# Patient Record
Sex: Male | Born: 1945
Health system: Southern US, Community
[De-identification: ages and names within clinical notes are randomized; demographics above are authoritative.]

## PROBLEM LIST (undated history)

## (undated) DIAGNOSIS — G8929 Other chronic pain: Secondary | ICD-10-CM

## (undated) DIAGNOSIS — E1151 Type 2 diabetes mellitus with diabetic peripheral angiopathy without gangrene: Secondary | ICD-10-CM

## (undated) DIAGNOSIS — R0602 Shortness of breath: Secondary | ICD-10-CM

## (undated) DIAGNOSIS — H9190 Unspecified hearing loss, unspecified ear: Secondary | ICD-10-CM

## (undated) DIAGNOSIS — M542 Cervicalgia: Secondary | ICD-10-CM

## (undated) DIAGNOSIS — H9319 Tinnitus, unspecified ear: Secondary | ICD-10-CM

## (undated) DIAGNOSIS — Z8669 Personal history of other diseases of the nervous system and sense organs: Secondary | ICD-10-CM

## (undated) DIAGNOSIS — J45909 Unspecified asthma, uncomplicated: Secondary | ICD-10-CM

## (undated) DIAGNOSIS — F439 Reaction to severe stress, unspecified: Secondary | ICD-10-CM

## (undated) DIAGNOSIS — I739 Peripheral vascular disease, unspecified: Secondary | ICD-10-CM

## (undated) DIAGNOSIS — Z8673 Personal history of transient ischemic attack (TIA), and cerebral infarction without residual deficits: Secondary | ICD-10-CM

## (undated) DIAGNOSIS — D649 Anemia, unspecified: Secondary | ICD-10-CM

## (undated) DIAGNOSIS — J449 Chronic obstructive pulmonary disease, unspecified: Secondary | ICD-10-CM

## (undated) DIAGNOSIS — L02612 Cutaneous abscess of left foot: Secondary | ICD-10-CM

## (undated) DIAGNOSIS — I499 Cardiac arrhythmia, unspecified: Secondary | ICD-10-CM

## (undated) DIAGNOSIS — M7989 Other specified soft tissue disorders: Secondary | ICD-10-CM

## (undated) DIAGNOSIS — R0609 Other forms of dyspnea: Secondary | ICD-10-CM

## (undated) DIAGNOSIS — M109 Gout, unspecified: Secondary | ICD-10-CM

## (undated) DIAGNOSIS — M545 Low back pain, unspecified: Secondary | ICD-10-CM

## (undated) DIAGNOSIS — I671 Cerebral aneurysm, nonruptured: Secondary | ICD-10-CM

## (undated) DIAGNOSIS — M7130 Other bursal cyst, unspecified site: Secondary | ICD-10-CM

## (undated) DIAGNOSIS — I714 Abdominal aortic aneurysm, without rupture: Secondary | ICD-10-CM

## (undated) DIAGNOSIS — M6289 Other specified disorders of muscle: Secondary | ICD-10-CM

## (undated) DIAGNOSIS — R531 Weakness: Secondary | ICD-10-CM

## (undated) DIAGNOSIS — L299 Pruritus, unspecified: Secondary | ICD-10-CM

## (undated) DIAGNOSIS — F32A Depression, unspecified: Secondary | ICD-10-CM

## (undated) DIAGNOSIS — M436 Torticollis: Secondary | ICD-10-CM

## (undated) DIAGNOSIS — G609 Hereditary and idiopathic neuropathy, unspecified: Secondary | ICD-10-CM

## (undated) DIAGNOSIS — M25551 Pain in right hip: Secondary | ICD-10-CM

## (undated) DIAGNOSIS — I639 Cerebral infarction, unspecified: Secondary | ICD-10-CM

## (undated) DIAGNOSIS — M1990 Unspecified osteoarthritis, unspecified site: Secondary | ICD-10-CM

## (undated) DIAGNOSIS — R252 Cramp and spasm: Secondary | ICD-10-CM

## (undated) DIAGNOSIS — N200 Calculus of kidney: Secondary | ICD-10-CM

## (undated) DIAGNOSIS — I10 Essential (primary) hypertension: Secondary | ICD-10-CM

## (undated) DIAGNOSIS — J189 Pneumonia, unspecified organism: Secondary | ICD-10-CM

## (undated) DIAGNOSIS — F329 Major depressive disorder, single episode, unspecified: Secondary | ICD-10-CM

## (undated) DIAGNOSIS — E119 Type 2 diabetes mellitus without complications: Secondary | ICD-10-CM

## (undated) DIAGNOSIS — M79605 Pain in left leg: Secondary | ICD-10-CM

## (undated) DIAGNOSIS — T7840XA Allergy, unspecified, initial encounter: Secondary | ICD-10-CM

## (undated) DIAGNOSIS — M25569 Pain in unspecified knee: Secondary | ICD-10-CM

## (undated) DIAGNOSIS — N289 Disorder of kidney and ureter, unspecified: Secondary | ICD-10-CM

## (undated) DIAGNOSIS — J42 Unspecified chronic bronchitis: Secondary | ICD-10-CM

## (undated) DIAGNOSIS — K219 Gastro-esophageal reflux disease without esophagitis: Secondary | ICD-10-CM

## (undated) DIAGNOSIS — J309 Allergic rhinitis, unspecified: Secondary | ICD-10-CM

## (undated) DIAGNOSIS — R5383 Other fatigue: Secondary | ICD-10-CM

## (undated) DIAGNOSIS — G629 Polyneuropathy, unspecified: Secondary | ICD-10-CM

## (undated) DIAGNOSIS — H43399 Other vitreous opacities, unspecified eye: Secondary | ICD-10-CM

## (undated) DIAGNOSIS — I2510 Atherosclerotic heart disease of native coronary artery without angina pectoris: Secondary | ICD-10-CM

## (undated) DIAGNOSIS — F419 Anxiety disorder, unspecified: Secondary | ICD-10-CM

## (undated) DIAGNOSIS — E785 Hyperlipidemia, unspecified: Secondary | ICD-10-CM

## (undated) DIAGNOSIS — Z87442 Personal history of urinary calculi: Secondary | ICD-10-CM

## (undated) HISTORY — DX: Tinnitus, unspecified ear: H93.19

## (undated) HISTORY — DX: Other bursal cyst, unspecified site: M71.30

## (undated) HISTORY — DX: Disorder of kidney and ureter, unspecified: N28.9

## (undated) HISTORY — DX: Torticollis: M43.6

## (undated) HISTORY — DX: Pruritus, unspecified: L29.9

## (undated) HISTORY — DX: Other vitreous opacities, unspecified eye: H43.399

## (undated) HISTORY — DX: Chronic obstructive pulmonary disease, unspecified: J44.9

## (undated) HISTORY — DX: Weakness: R53.1

## (undated) HISTORY — DX: Allergy, unspecified, initial encounter: T78.40XA

## (undated) HISTORY — DX: Pain in left leg: M79.605

## (undated) HISTORY — DX: Cerebral infarction, unspecified: I63.9

## (undated) HISTORY — DX: Hyperlipidemia, unspecified: E78.5

## (undated) HISTORY — DX: Major depressive disorder, single episode, unspecified: F32.9

## (undated) HISTORY — DX: Other specified disorders of muscle: M62.89

## (undated) HISTORY — DX: Personal history of transient ischemic attack (TIA), and cerebral infarction without residual deficits: Z86.73

## (undated) HISTORY — DX: Reaction to severe stress, unspecified: F43.9

## (undated) HISTORY — DX: Cerebral aneurysm, nonruptured: I67.1

## (undated) HISTORY — DX: Other fatigue: R53.83

## (undated) HISTORY — DX: Personal history of other diseases of the nervous system and sense organs: Z86.69

## (undated) HISTORY — DX: Low back pain: M54.5

## (undated) HISTORY — DX: Unspecified osteoarthritis, unspecified site: M19.90

## (undated) HISTORY — DX: Anxiety disorder, unspecified: F41.9

## (undated) HISTORY — DX: Allergic rhinitis, unspecified: J30.9

## (undated) HISTORY — DX: Atherosclerotic heart disease of native coronary artery without angina pectoris: I25.10

## (undated) HISTORY — DX: Cramp and spasm: R25.2

## (undated) HISTORY — DX: Peripheral vascular disease, unspecified: I73.9

## (undated) HISTORY — DX: Pain in unspecified knee: M25.569

## (undated) HISTORY — DX: Type 2 diabetes mellitus without complications: E11.9

## (undated) HISTORY — DX: Depression, unspecified: F32.A

## (undated) HISTORY — DX: Essential (primary) hypertension: I10

## (undated) HISTORY — DX: Type 2 diabetes mellitus with diabetic peripheral angiopathy without gangrene: E11.51

## (undated) HISTORY — DX: Pain in right hip: M25.551

## (undated) HISTORY — DX: Polyneuropathy, unspecified: G62.9

## (undated) HISTORY — DX: Cervicalgia: M54.2

## (undated) HISTORY — DX: Hereditary and idiopathic neuropathy, unspecified: G60.9

## (undated) HISTORY — DX: Unspecified hearing loss, unspecified ear: H91.90

## (undated) HISTORY — DX: Shortness of breath: R06.02

## (undated) HISTORY — DX: Other specified soft tissue disorders: M79.89

## (undated) HISTORY — DX: Anemia, unspecified: D64.9

---
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
## 1982-11-05 HISTORY — PX: POSTERIOR LAMINECTOMY / DECOMPRESSION LUMBAR SPINE: SUR740

## 1983-11-06 HISTORY — PX: WRIST FRACTURE SURGERY: SHX121

## 1993-11-05 HISTORY — PX: LEG SURGERY: SHX1003

## 2004-11-05 HISTORY — PX: CAROTID ENDARTERECTOMY: SUR193

## 2005-05-05 ENCOUNTER — Inpatient Hospital Stay (HOSPITAL_COMMUNITY): Admission: AD | Admit: 2005-05-05 | Discharge: 2005-05-12 | Payer: Self-pay | Admitting: Cardiovascular Disease

## 2005-05-09 DIAGNOSIS — I639 Cerebral infarction, unspecified: Secondary | ICD-10-CM

## 2005-05-09 HISTORY — DX: Cerebral infarction, unspecified: I63.9

## 2005-05-11 ENCOUNTER — Encounter (INDEPENDENT_AMBULATORY_CARE_PROVIDER_SITE_OTHER): Payer: Self-pay | Admitting: *Deleted

## 2005-06-28 ENCOUNTER — Encounter (INDEPENDENT_AMBULATORY_CARE_PROVIDER_SITE_OTHER): Payer: Self-pay | Admitting: *Deleted

## 2005-06-28 ENCOUNTER — Inpatient Hospital Stay (HOSPITAL_COMMUNITY): Admission: RE | Admit: 2005-06-28 | Discharge: 2005-06-29 | Payer: Self-pay | Admitting: Vascular Surgery

## 2005-10-19 ENCOUNTER — Encounter: Admission: RE | Admit: 2005-10-19 | Discharge: 2005-10-19 | Payer: Self-pay | Admitting: Neurology

## 2005-10-21 ENCOUNTER — Encounter: Admission: RE | Admit: 2005-10-21 | Discharge: 2005-10-21 | Payer: Self-pay | Admitting: Neurology

## 2005-11-05 DIAGNOSIS — I639 Cerebral infarction, unspecified: Secondary | ICD-10-CM

## 2005-11-05 HISTORY — PX: CATARACT EXTRACTION W/ INTRAOCULAR LENS  IMPLANT, BILATERAL: SHX1307

## 2005-11-05 HISTORY — DX: Cerebral infarction, unspecified: I63.9

## 2005-11-12 ENCOUNTER — Ambulatory Visit: Payer: Self-pay | Admitting: Cardiology

## 2005-11-20 ENCOUNTER — Ambulatory Visit: Payer: Self-pay | Admitting: *Deleted

## 2006-01-18 ENCOUNTER — Ambulatory Visit: Payer: Self-pay | Admitting: Cardiology

## 2006-02-18 ENCOUNTER — Ambulatory Visit: Payer: Self-pay | Admitting: Cardiology

## 2006-04-23 ENCOUNTER — Ambulatory Visit: Payer: Self-pay | Admitting: Cardiology

## 2006-05-27 LAB — HM COLONOSCOPY

## 2006-06-27 ENCOUNTER — Ambulatory Visit: Payer: Self-pay | Admitting: Cardiology

## 2006-07-03 ENCOUNTER — Ambulatory Visit: Payer: Self-pay | Admitting: Cardiology

## 2006-08-21 ENCOUNTER — Ambulatory Visit: Payer: Self-pay | Admitting: Cardiology

## 2006-10-18 ENCOUNTER — Encounter: Admission: RE | Admit: 2006-10-18 | Discharge: 2006-10-18 | Payer: Self-pay | Admitting: Neurology

## 2006-11-05 DIAGNOSIS — I714 Abdominal aortic aneurysm, without rupture, unspecified: Secondary | ICD-10-CM

## 2006-11-05 HISTORY — DX: Abdominal aortic aneurysm, without rupture, unspecified: I71.40

## 2006-11-05 HISTORY — DX: Abdominal aortic aneurysm, without rupture: I71.4

## 2006-11-16 ENCOUNTER — Emergency Department (HOSPITAL_COMMUNITY): Admission: EM | Admit: 2006-11-16 | Discharge: 2006-11-16 | Payer: Self-pay | Admitting: Family Medicine

## 2007-02-13 ENCOUNTER — Ambulatory Visit: Payer: Self-pay | Admitting: Cardiology

## 2007-02-18 ENCOUNTER — Ambulatory Visit: Payer: Self-pay | Admitting: Cardiology

## 2007-02-18 LAB — CONVERTED CEMR LAB
ALT: 31 units/L (ref 0–40)
AST: 27 units/L (ref 0–37)
Albumin: 3.5 g/dL (ref 3.5–5.2)
Alkaline Phosphatase: 71 units/L (ref 39–117)
BUN: 11 mg/dL (ref 6–23)
Bilirubin, Direct: 0.1 mg/dL (ref 0.0–0.3)
CO2: 31 meq/L (ref 19–32)
CRP, High Sensitivity: 1 (ref 0.00–5.00)
Calcium: 9.1 mg/dL (ref 8.4–10.5)
Chloride: 103 meq/L (ref 96–112)
Cholesterol: 149 mg/dL (ref 0–200)
Creatinine, Ser: 0.9 mg/dL (ref 0.4–1.5)
Direct LDL: 95.5 mg/dL
GFR calc Af Amer: 111 mL/min
GFR calc non Af Amer: 92 mL/min
Glucose, Bld: 189 mg/dL — ABNORMAL HIGH (ref 70–99)
HDL: 31.8 mg/dL — ABNORMAL LOW (ref >39.0)
Potassium: 4.3 meq/L (ref 3.5–5.1)
Sodium: 139 meq/L (ref 135–145)
Total Bilirubin: 0.6 mg/dL (ref 0.3–1.2)
Total CHOL/HDL Ratio: 4.7
Total Protein: 6.5 g/dL (ref 6.0–8.3)
Triglycerides: 258 mg/dL (ref 0–149)
VLDL: 52 mg/dL — ABNORMAL HIGH (ref 0–40)

## 2007-03-18 ENCOUNTER — Ambulatory Visit: Payer: Self-pay | Admitting: Internal Medicine

## 2007-03-18 LAB — CONVERTED CEMR LAB
Creatinine,U: 52.1 mg/dL
Hgb A1c MFr Bld: 8.3 % — ABNORMAL HIGH
Microalb Creat Ratio: 9.6 mg/g
Microalb, Ur: 0.5 mg/dL

## 2007-03-25 ENCOUNTER — Ambulatory Visit: Payer: Self-pay | Admitting: Internal Medicine

## 2007-03-25 LAB — CONVERTED CEMR LAB
BUN: 16 mg/dL (ref 6–23)
CO2: 28 meq/L (ref 19–32)
Calcium: 9.2 mg/dL (ref 8.4–10.5)
Chloride: 104 meq/L (ref 96–112)
Creatinine, Ser: 0.8 mg/dL (ref 0.4–1.5)
Folate: 12.4 ng/mL
GFR calc Af Amer: 127 mL/min
GFR calc non Af Amer: 105 mL/min
Glucose, Bld: 155 mg/dL — ABNORMAL HIGH (ref 70–99)
Potassium: 4.7 meq/L (ref 3.5–5.1)
Sodium: 137 meq/L (ref 135–145)
Vitamin B-12: 343 pg/mL (ref 211–911)

## 2007-03-26 ENCOUNTER — Ambulatory Visit (HOSPITAL_BASED_OUTPATIENT_CLINIC_OR_DEPARTMENT_OTHER): Admission: RE | Admit: 2007-03-26 | Discharge: 2007-03-26 | Payer: Self-pay | Admitting: Internal Medicine

## 2007-04-05 ENCOUNTER — Ambulatory Visit: Payer: Self-pay | Admitting: Pulmonary Disease

## 2007-04-21 DIAGNOSIS — E1149 Type 2 diabetes mellitus with other diabetic neurological complication: Secondary | ICD-10-CM | POA: Insufficient documentation

## 2007-04-21 HISTORY — DX: Type 2 diabetes mellitus with other diabetic neurological complication: E11.49

## 2007-05-19 ENCOUNTER — Ambulatory Visit: Payer: Self-pay | Admitting: Internal Medicine

## 2007-07-21 ENCOUNTER — Ambulatory Visit: Payer: Self-pay | Admitting: Internal Medicine

## 2007-09-26 ENCOUNTER — Telehealth: Payer: Self-pay | Admitting: Internal Medicine

## 2007-10-17 DIAGNOSIS — I152 Hypertension secondary to endocrine disorders: Secondary | ICD-10-CM | POA: Insufficient documentation

## 2007-10-17 DIAGNOSIS — I2510 Atherosclerotic heart disease of native coronary artery without angina pectoris: Secondary | ICD-10-CM

## 2007-10-17 DIAGNOSIS — H409 Unspecified glaucoma: Secondary | ICD-10-CM | POA: Insufficient documentation

## 2007-10-17 DIAGNOSIS — Z87442 Personal history of urinary calculi: Secondary | ICD-10-CM | POA: Insufficient documentation

## 2007-10-17 DIAGNOSIS — Z8673 Personal history of transient ischemic attack (TIA), and cerebral infarction without residual deficits: Secondary | ICD-10-CM | POA: Insufficient documentation

## 2007-10-17 DIAGNOSIS — E785 Hyperlipidemia, unspecified: Secondary | ICD-10-CM | POA: Insufficient documentation

## 2007-10-17 DIAGNOSIS — I10 Essential (primary) hypertension: Secondary | ICD-10-CM | POA: Insufficient documentation

## 2007-10-17 DIAGNOSIS — E1159 Type 2 diabetes mellitus with other circulatory complications: Secondary | ICD-10-CM | POA: Insufficient documentation

## 2007-10-17 DIAGNOSIS — Z87898 Personal history of other specified conditions: Secondary | ICD-10-CM | POA: Insufficient documentation

## 2007-10-17 DIAGNOSIS — N200 Calculus of kidney: Secondary | ICD-10-CM | POA: Insufficient documentation

## 2007-10-17 HISTORY — DX: Atherosclerotic heart disease of native coronary artery without angina pectoris: I25.10

## 2007-10-17 HISTORY — DX: Unspecified glaucoma: H40.9

## 2007-10-20 ENCOUNTER — Ambulatory Visit: Payer: Self-pay | Admitting: Internal Medicine

## 2007-10-20 LAB — CONVERTED CEMR LAB
BUN: 16 mg/dL (ref 6–23)
CO2: 30 meq/L (ref 19–32)
Calcium: 9.6 mg/dL (ref 8.4–10.5)
Chloride: 100 meq/L (ref 96–112)
Creatinine, Ser: 0.8 mg/dL (ref 0.4–1.5)
Creatinine,U: 100.5 mg/dL
GFR calc Af Amer: 127 mL/min
GFR calc non Af Amer: 105 mL/min
Glucose, Bld: 135 mg/dL — ABNORMAL HIGH (ref 70–99)
Hgb A1c MFr Bld: 6.9 % — ABNORMAL HIGH (ref 4.6–6.0)
Microalb Creat Ratio: 5 mg/g (ref 0.0–30.0)
Microalb, Ur: 0.5 mg/dL (ref 0.0–1.9)
Potassium: 4.3 meq/L (ref 3.5–5.1)
Sodium: 139 meq/L (ref 135–145)

## 2008-01-19 ENCOUNTER — Ambulatory Visit: Payer: Self-pay | Admitting: Internal Medicine

## 2008-02-25 ENCOUNTER — Telehealth: Payer: Self-pay | Admitting: Internal Medicine

## 2008-04-19 ENCOUNTER — Ambulatory Visit: Payer: Self-pay | Admitting: Internal Medicine

## 2008-04-19 LAB — CONVERTED CEMR LAB
ALT: 32 units/L (ref 0–53)
AST: 23 units/L (ref 0–37)
BUN: 21 mg/dL (ref 6–23)
CO2: 32 meq/L (ref 19–32)
Calcium: 9.5 mg/dL (ref 8.4–10.5)
Chloride: 100 meq/L (ref 96–112)
Cholesterol: 133 mg/dL (ref 0–200)
Creatinine, Ser: 0.9 mg/dL (ref 0.4–1.5)
Creatinine,U: 111 mg/dL
Direct LDL: 84.1 mg/dL
GFR calc Af Amer: 111 mL/min
GFR calc non Af Amer: 91 mL/min
Glucose, Bld: 175 mg/dL — ABNORMAL HIGH (ref 70–99)
HDL: 27 mg/dL — ABNORMAL LOW (ref >39.0)
Hgb A1c MFr Bld: 6.9 % — ABNORMAL HIGH (ref 4.6–6.0)
Microalb Creat Ratio: 10.8 mg/g (ref 0.0–30.0)
Microalb, Ur: 1.2 mg/dL (ref 0.0–1.9)
Potassium: 4.8 meq/L (ref 3.5–5.1)
Sodium: 140 meq/L (ref 135–145)
TSH: 2.15 microintl units/mL (ref 0.35–5.50)
Total CHOL/HDL Ratio: 4.9
Triglycerides: 211 mg/dL (ref 0–149)
VLDL: 42 mg/dL — ABNORMAL HIGH (ref 0–40)

## 2008-05-18 ENCOUNTER — Ambulatory Visit: Payer: Self-pay | Admitting: Internal Medicine

## 2008-06-24 ENCOUNTER — Telehealth: Payer: Self-pay | Admitting: Internal Medicine

## 2008-09-24 ENCOUNTER — Ambulatory Visit: Payer: Self-pay | Admitting: Internal Medicine

## 2008-09-24 LAB — CONVERTED CEMR LAB
Creatinine,U: 121.4 mg/dL
Hgb A1c MFr Bld: 7.2 % — ABNORMAL HIGH (ref 4.6–6.0)
Microalb Creat Ratio: 5.8 mg/g (ref 0.0–30.0)
Microalb, Ur: 0.7 mg/dL (ref 0.0–1.9)

## 2008-10-01 ENCOUNTER — Ambulatory Visit: Payer: Self-pay | Admitting: Internal Medicine

## 2008-10-04 ENCOUNTER — Encounter: Payer: Self-pay | Admitting: Internal Medicine

## 2008-10-07 ENCOUNTER — Encounter: Payer: Self-pay | Admitting: Internal Medicine

## 2008-12-22 ENCOUNTER — Encounter (INDEPENDENT_AMBULATORY_CARE_PROVIDER_SITE_OTHER): Payer: Self-pay | Admitting: *Deleted

## 2009-01-03 ENCOUNTER — Ambulatory Visit: Payer: Self-pay | Admitting: Internal Medicine

## 2009-01-03 LAB — CONVERTED CEMR LAB
BUN: 16 mg/dL (ref 6–23)
CO2: 30 meq/L (ref 19–32)
Calcium: 9.5 mg/dL (ref 8.4–10.5)
Chloride: 100 meq/L (ref 96–112)
Creatinine, Ser: 0.7 mg/dL (ref 0.4–1.5)
GFR calc Af Amer: 147 mL/min
GFR calc non Af Amer: 122 mL/min
Glucose, Bld: 138 mg/dL — ABNORMAL HIGH (ref 70–99)
Hgb A1c MFr Bld: 7.1 % — ABNORMAL HIGH (ref 4.6–6.0)
Potassium: 4.5 meq/L (ref 3.5–5.1)
Sodium: 141 meq/L (ref 135–145)

## 2009-01-10 ENCOUNTER — Ambulatory Visit: Payer: Self-pay | Admitting: Internal Medicine

## 2009-03-30 ENCOUNTER — Encounter: Payer: Self-pay | Admitting: Internal Medicine

## 2009-05-30 ENCOUNTER — Ambulatory Visit: Payer: Self-pay | Admitting: Internal Medicine

## 2009-05-30 LAB — CONVERTED CEMR LAB
ALT: 29 units/L (ref 0–53)
AST: 28 units/L (ref 0–37)
Cholesterol: 147 mg/dL (ref 0–200)
Direct LDL: 87.8 mg/dL
HDL: 30 mg/dL — ABNORMAL LOW (ref >39.00)
Hgb A1c MFr Bld: 7.3 % — ABNORMAL HIGH (ref 4.6–6.5)
Total CHOL/HDL Ratio: 5
Triglycerides: 224 mg/dL — ABNORMAL HIGH (ref 0.0–149.0)
VLDL: 44.8 mg/dL — ABNORMAL HIGH (ref 0.0–40.0)

## 2009-06-03 ENCOUNTER — Ambulatory Visit: Payer: Self-pay | Admitting: Internal Medicine

## 2009-10-03 ENCOUNTER — Ambulatory Visit: Payer: Self-pay | Admitting: Internal Medicine

## 2009-10-03 LAB — CONVERTED CEMR LAB
BUN: 12 mg/dL (ref 6–23)
CO2: 31 meq/L (ref 19–32)
Calcium: 9.2 mg/dL (ref 8.4–10.5)
Chloride: 100 meq/L (ref 96–112)
Creatinine, Ser: 0.7 mg/dL (ref 0.4–1.5)
GFR calc non Af Amer: 121.4 mL/min (ref >60)
Glucose, Bld: 262 mg/dL — ABNORMAL HIGH (ref 70–99)
Hgb A1c MFr Bld: 7.5 % — ABNORMAL HIGH (ref 4.6–6.5)
Potassium: 4.2 meq/L (ref 3.5–5.1)
Sodium: 139 meq/L (ref 135–145)

## 2009-10-07 ENCOUNTER — Ambulatory Visit: Payer: Self-pay | Admitting: Internal Medicine

## 2009-10-10 ENCOUNTER — Encounter: Payer: Self-pay | Admitting: Internal Medicine

## 2009-10-10 LAB — HM DIABETES EYE EXAM: HM Diabetic Eye Exam: NORMAL

## 2009-10-14 ENCOUNTER — Telehealth: Payer: Self-pay | Admitting: Internal Medicine

## 2009-10-19 ENCOUNTER — Telehealth: Payer: Self-pay | Admitting: Internal Medicine

## 2009-11-05 DIAGNOSIS — J189 Pneumonia, unspecified organism: Secondary | ICD-10-CM

## 2009-11-05 HISTORY — DX: Pneumonia, unspecified organism: J18.9

## 2009-11-09 ENCOUNTER — Telehealth: Payer: Self-pay | Admitting: Internal Medicine

## 2009-11-16 ENCOUNTER — Telehealth: Payer: Self-pay | Admitting: Internal Medicine

## 2009-11-16 ENCOUNTER — Ambulatory Visit: Payer: Self-pay | Admitting: Family

## 2009-11-16 DIAGNOSIS — K219 Gastro-esophageal reflux disease without esophagitis: Secondary | ICD-10-CM

## 2009-11-16 HISTORY — DX: Gastro-esophageal reflux disease without esophagitis: K21.9

## 2009-12-05 ENCOUNTER — Ambulatory Visit: Payer: Self-pay | Admitting: Internal Medicine

## 2009-12-05 LAB — CONVERTED CEMR LAB
BUN: 14 mg/dL (ref 6–23)
CO2: 30 meq/L (ref 19–32)
Calcium: 9.4 mg/dL (ref 8.4–10.5)
Chloride: 103 meq/L (ref 96–112)
Creatinine, Ser: 0.9 mg/dL (ref 0.4–1.5)
GFR calc non Af Amer: 90.79 mL/min (ref >60)
Glucose, Bld: 114 mg/dL — ABNORMAL HIGH (ref 70–99)
Potassium: 4 meq/L (ref 3.5–5.1)
Sodium: 141 meq/L (ref 135–145)

## 2009-12-12 ENCOUNTER — Ambulatory Visit: Payer: Self-pay | Admitting: Internal Medicine

## 2009-12-12 DIAGNOSIS — M25562 Pain in left knee: Secondary | ICD-10-CM | POA: Insufficient documentation

## 2009-12-12 HISTORY — DX: Pain in left knee: M25.562

## 2009-12-22 ENCOUNTER — Inpatient Hospital Stay (HOSPITAL_COMMUNITY): Admission: EM | Admit: 2009-12-22 | Discharge: 2009-12-24 | Payer: Self-pay | Admitting: Emergency Medicine

## 2009-12-30 ENCOUNTER — Ambulatory Visit: Payer: Self-pay | Admitting: Internal Medicine

## 2009-12-30 ENCOUNTER — Ambulatory Visit (HOSPITAL_BASED_OUTPATIENT_CLINIC_OR_DEPARTMENT_OTHER): Admission: RE | Admit: 2009-12-30 | Discharge: 2009-12-30 | Payer: Self-pay | Admitting: Internal Medicine

## 2009-12-30 ENCOUNTER — Ambulatory Visit: Payer: Self-pay | Admitting: Diagnostic Radiology

## 2009-12-30 DIAGNOSIS — J189 Pneumonia, unspecified organism: Secondary | ICD-10-CM | POA: Insufficient documentation

## 2010-01-02 ENCOUNTER — Telehealth: Payer: Self-pay | Admitting: Internal Medicine

## 2010-01-30 ENCOUNTER — Ambulatory Visit: Payer: Self-pay | Admitting: Internal Medicine

## 2010-04-17 ENCOUNTER — Ambulatory Visit: Payer: Self-pay | Admitting: Internal Medicine

## 2010-04-17 LAB — CONVERTED CEMR LAB
BUN: 15 mg/dL (ref 6–23)
CO2: 27 meq/L (ref 19–32)
Calcium: 9.3 mg/dL (ref 8.4–10.5)
Chloride: 105 meq/L (ref 96–112)
Creatinine, Ser: 0.8 mg/dL (ref 0.4–1.5)
GFR calc non Af Amer: 106.96 mL/min (ref >60)
Glucose, Bld: 242 mg/dL — ABNORMAL HIGH (ref 70–99)
Hgb A1c MFr Bld: 6.4 % (ref 4.6–6.5)
Potassium: 4.6 meq/L (ref 3.5–5.1)
Sodium: 140 meq/L (ref 135–145)

## 2010-04-27 ENCOUNTER — Ambulatory Visit: Payer: Self-pay | Admitting: Internal Medicine

## 2010-04-27 DIAGNOSIS — I714 Abdominal aortic aneurysm, without rupture, unspecified: Secondary | ICD-10-CM | POA: Insufficient documentation

## 2010-06-07 ENCOUNTER — Telehealth: Payer: Self-pay | Admitting: Internal Medicine

## 2010-07-04 ENCOUNTER — Encounter: Payer: Self-pay | Admitting: Internal Medicine

## 2010-07-06 ENCOUNTER — Telehealth: Payer: Self-pay | Admitting: Internal Medicine

## 2010-07-12 ENCOUNTER — Telehealth: Payer: Self-pay | Admitting: Internal Medicine

## 2010-07-14 ENCOUNTER — Telehealth: Payer: Self-pay | Admitting: Internal Medicine

## 2010-08-14 ENCOUNTER — Ambulatory Visit: Payer: Self-pay | Admitting: Internal Medicine

## 2010-08-21 ENCOUNTER — Ambulatory Visit: Payer: Self-pay | Admitting: Internal Medicine

## 2010-08-21 LAB — HM DIABETES FOOT EXAM

## 2010-12-07 NOTE — Progress Notes (Signed)
Summary: Test Results  Phone Note Outgoing Call   Summary of Call: call pt - no change in size of abdominal aorta Initial call taken by: [name] DO,  July 06, 2010 5:55 PM  Follow-up for Phone Call        Left message with pt's wife to have pt return my call. Nicki Guadalajara Fergerson CMA Duncan Dull)  July 07, 2010 9:03 AM   Additional Follow-up for Phone Call Additional follow up Details #1::        call placed to patient at [phone], patients wife [name] has been advised per Dr Artist Pais instructions Additional Follow-up by: Glendell Docker CMA,  July 07, 2010 11:43 AM
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
## 2010-12-07 NOTE — Progress Notes (Signed)
Summary: Victoza Samples  Phone Note Call from Patient Call back at Home Phone [phone]   Caller: Spouse- [name] Call For: Donald Spry DO Summary of Call: patients wife called and left voice message requesting samples of Victoza. Initial call taken by: Glendell Docker CMA,  June 07, 2010 11:04 AM  Follow-up for Phone Call        call returned to patients wife /patient, no answer. Voice message was left informing patient samples left at front desk for patient pick up. Follow-up by: Glendell Docker CMA,  June 07, 2010 11:34 AM    Prescriptions: VICTOZA 18 MG/3ML SOLN (LIRAGLUTIDE) inject 1.2 mg  subcutaneously once daily as directed  #1 x 0   Entered by:   Glendell Docker CMA   Authorized by:   [name] DO   Signed by:   Glendell Docker CMA on 06/07/2010   Method used:   Samples Given   RxID:   [id number]
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
## 2010-12-07 NOTE — Progress Notes (Signed)
Summary: Victoza Refill  Phone Note Call from Patient Call back at Home Phone [phone]   Caller: Patient Summary of Call: patient called and left voice message requesting a refill on the Victoza if he is to continue taking the medication.  Call was returned to patient, he was informed that Dr Artist Pais would like for him to continue with taking the Victoza until her returns in February for his follow up appointment. He was informed there were not any samples available and a rx would be sent to his local pharmacy. Patient was asked to verify how he is to be taking the medication and he states that he is injecting 1.2 mg once a day. Patient was concerned about the cost of the medication. He was advised to check with his pharmacist on the cost. Patient verablized understanding and agrees with instruction given Initial call taken by: Glendell Docker CMA,  November 09, 2009 5:30 PM    New/Updated Medications: VICTOZA 18 MG/3ML SOLN (LIRAGLUTIDE) inject 1.2 mg  subcutaneously once daily as directed BD PEN NEEDLE NANO U/F 32G X 4 MM MISC (INSULIN PEN NEEDLE) use for insulin injection once daily ( DX Code 250.00) Prescriptions: BD PEN NEEDLE NANO U/F 32G X 4 MM MISC (INSULIN PEN NEEDLE) use for insulin injection once daily ( DX Code 250.00)  #1 x 1   Entered by:   Glendell Docker CMA   Authorized by:   [name] DO   Signed by:   Glendell Docker CMA on 11/09/2009   Method used:   [facility]        [facility]  [address] [id number]       [address]       [place], [place]  [address]       Ph: [phone]       Fax: [phone]   RxID:   [id number] VICTOZA 18 MG/3ML SOLN (LIRAGLUTIDE) inject 1.2 mg  subcutaneously once daily as directed  #1 x 2   Entered by:   Glendell Docker CMA   Authorized by:   [name] DO   Signed by:   Glendell Docker CMA on 11/09/2009   Method used:   [facility]        [facility]  [address] [id number]       [address]       [place], [place]  [address]       Ph: [phone]       Fax: [phone]   RxID:   [id number]

## 2010-12-07 NOTE — Progress Notes (Signed)
Summary: side effects from med and a refill request   Phone Note Call from Patient   Caller: Spouse Summary of Call: pt. had increased his Carvelidol-(Med) as Dr.May Ozment instructed.... Dr.Shakera Ebrahimi instructed him to call the office if it makes him dizzy ... He is feeling dizzy and went ahead and changed his med back to half a pill twice a day instead of the whole pill that was making him dizzy. Please call patient back [phone].  Pt. is also requesting [facility] [address] until his mail order comes.  Initial call taken by: Michaelle Copas,  January 02, 2010 12:53 PM  Follow-up for Phone Call        [facility] to fill 1 month supply of metformin.  Pt to continue lower dose of coreg for now and keep f/u appointment with Dr. Artist Pais as scheduled.  If recurrent dizziness on this lower dose, he should schedule follow up sooner.  Follow-up by: Lemont Fillers FNP,  January 02, 2010 1:37 PM  Additional Follow-up for Phone Call Additional follow up Details #1::        call placed to [phone], patient wife answered  phone , she stated patient was not available. She was advised per Sandford Craze instructions.  She was advised if patient continues to have dizziness he would need to return before his 3/28 appointment. His wife verbalized understanding and agrees to instructions given Additional Follow-up by: Glendell Docker CMA,  January 02, 2010 1:47 PM    Prescriptions: METFORMIN HCL 500 MG  TB24 (METFORMIN HCL) Take 2 tablet by mouth two times a day  #120 x 0   Entered by:   Glendell Docker CMA   Authorized by:   [name] DO   Signed by:   Glendell Docker CMA on 01/02/2010   Method used:   [facility]        [facility]  [address] [id number]       [address]       [place], [place]  [address]       Ph: [phone]       Fax: [phone]   RxID:   [id number]
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
## 2010-12-07 NOTE — Letter (Signed)
     April 19, 2008   [name] [address] [place], [place] [address]  RE:  LAB RESULTS  Dear  [name],  The following is an interpretation of your most recent lab tests.  Please take note of any instructions provided or changes to medications that have resulted from your lab work.  ELECTROLYTES:  Good - no changes needed  KIDNEY FUNCTION TESTS:  Good - no changes needed  LIVER FUNCTION TESTS:  Stable - no changes needed  LIPID PANEL:  Fair - review at your next visit Triglyceride: 211   Cholesterol: 133   LDL: DEL   HDL: 27.0   Chol/HDL%:  4.9 CALC  THYROID STUDIES:  Thyroid studies normal TSH: 2.15     DIABETIC STUDIES:  Fair - schedule a follow-up appointment Blood Glucose: 175   HgbA1C: 6.9   Microalbumin/Creatinine Ratio: 10.8     I will further discuss your labs    Sincerely Yours,    [name]

## 2010-12-07 NOTE — Progress Notes (Signed)
[facility]  Phone Note Refill Request Message from:  Patient on July 14, 2010 2:58 PM  Refills Requested: Medication #1:  METFORMIN HCL 500 MG  TB24 Take 2 tablet by mouth two times a day   Dosage confirmed as above?Dosage Confirmed   Brand Name Necessary? No   Supply Requested: 2 wk Needs 2 week  [facility]         Method Requested: Electronic Initial call taken by: Darral Dash,  July 14, 2010 2:59 PM  Follow-up for Phone Call        Rx sent to pharmacy Follow-up by: Glendell Docker CMA,  July 14, 2010 5:34 PM    Prescriptions: METFORMIN HCL 500 MG  TB24 (METFORMIN HCL) Take 2 tablet by mouth two times a day  #60 x 0   Entered by:   Glendell Docker CMA   Authorized by:   [name] DO   Signed by:   Glendell Docker CMA on 07/14/2010   Method used:   [facility]        [facility]  [address] [id number]       [address]       [place], [place]  [address]       Ph: [phone]       Fax: [phone]   RxID:   [id number]

## 2010-12-07 NOTE — Progress Notes (Signed)
Summary: Rx Rf Simvastatin & Clonidine  Medications Added AMLODIPINE BESYLATE 10 MG TABS (AMLODIPINE BESYLATE)  BENICAR HCT 20-12.5 MG TABS (OLMESARTAN MEDOXOMIL-HCTZ) Take 1 tablet by mouth once a day CLONIDINE HCL 0.2 MG TABS (CLONIDINE HCL) Take 1 tablet by mouth twice a day CRESTOR 10 MG TABS (ROSUVASTATIN CALCIUM)  FREESTYLE LANCETS  MISC (LANCETS) Use 1 stick as directed once a day FREESTYLE LITE  STRP (GLUCOSE BLOOD) Use 1 strip as directed NIASPAN 500 MG TBCR (NIACIN (ANTIHYPERLIPIDEMIC)) Take 1 tablet by mouth at bedtime SIMVASTATIN 80 MG TABS (SIMVASTATIN) Take 1 tablet by mouth at bedtime TOPROL XL 100 MG TB24 (METOPROLOL SUCCINATE) Take 1 tablet by mouth as directed       Phone Note Refill Request   Refills Requested: Medication #1:  CLONIDINE HCL 0.2 MG TABS Take 1 tablet by mouth twice a day  Medication #2:  SIMVASTATIN 80 MG TABS Take 1 tablet by mouth at bedtime    New/Updated Medications: AMLODIPINE BESYLATE 10 MG TABS (AMLODIPINE BESYLATE)  BENICAR HCT 20-12.5 MG TABS (OLMESARTAN MEDOXOMIL-HCTZ) Take 1 tablet by mouth once a day CLONIDINE HCL 0.2 MG TABS (CLONIDINE HCL) Take 1 tablet by mouth twice a day CRESTOR 10 MG TABS (ROSUVASTATIN CALCIUM)  FREESTYLE LANCETS  MISC (LANCETS) Use 1 stick as directed once a day FREESTYLE LITE  STRP (GLUCOSE BLOOD) Use 1 strip as directed NIASPAN 500 MG TBCR (NIACIN (ANTIHYPERLIPIDEMIC)) Take 1 tablet by mouth at bedtime SIMVASTATIN 80 MG TABS (SIMVASTATIN) Take 1 tablet by mouth at bedtime TOPROL XL 100 MG TB24 (METOPROLOL SUCCINATE) Take 1 tablet by mouth as directed   Prescriptions: SIMVASTATIN 80 MG TABS (SIMVASTATIN) Take 1 tablet by mouth at bedtime  #60 x 5   Entered by:   Lamar Sprinkles   Authorized by:   [name] DO   Signed by:   Lamar Sprinkles on 09/26/2007   Method used:   [facility] to ...       [facility]  [address] [id number]       [address]       [place], [place]   [address]       Ph: [phone] or [phone]       Fax: [phone]   RxID:   [id number] CLONIDINE HCL 0.2 MG TABS (CLONIDINE HCL) Take 1 tablet by mouth twice a day  #60 x 5   Entered by:   Lamar Sprinkles   Authorized by:   [name] DO   Signed by:   Lamar Sprinkles on 09/26/2007   Method used:   Electronically sent to ...       [facility]  [address] [id number]       [address]       [place], [place]  [address]       Ph: [phone] or [phone]       Fax: [phone]   RxID:   [id number]

## 2010-12-07 NOTE — Progress Notes (Signed)
Summary: Rx Outreach  Phone Note Refill Request Call back at [facility] Phone [phone] Message from:  Patient on July 12, 2010 3:14 PM  Refills Requested: Medication #1:  METFORMIN HCL 500 MG  TB24 Take 2 tablet by mouth two times a day   Dosage confirmed as above?Dosage Confirmed   Brand Name Necessary? No   Supply Requested: 6 months   Last Refilled: 12/30/2009  Medication #2:  SIMVASTATIN 40 MG TABS one by mouth qpm   Dosage confirmed as above?Dosage Confirmed   Supply Requested: 6 months  Medication #3:  CLONIDINE HCL 0.2 MG TABS Take 1/2  tablet by mouth twice a day   Dosage confirmed as above?Dosage Confirmed   Supply Requested: 6 months  Medication #4:  TRIBENZOR 40-5-12.5 MG TABS 1/2 by mouth once daily   Dosage confirmed as above?Dosage Confirmed   Supply Requested: 6 months [facility] [phone], pt out of metformin please call in 15 day supply to [facility]   Method Requested: Electronic Next Appointment Scheduled: 10.10.2011 Initial call taken by: Lannette Donath,  July 12, 2010 3:14 PM  Follow-up for Phone Call        Pt called back & said that the Rx needs to be received by 5:00 today by RX Outreach or Rx will need to be rewritten Diane Tomerlin  July 13, 2010 10:05 AM  Additional Follow-up for Phone Call Additional follow up Details #1::        Spoke to [facility] at [facility] and he stated that we will need to fax written rxs to [phone]. Nicki Guadalajara Fergerson CMA Duncan Dull)  July 14, 2010 2:10 PM     Additional Follow-up for Phone Call Additional follow up Details #2::    please fax rx as requested Follow-up by: [name] DO,  July 14, 2010 5:12 PM  Additional Follow-up for Phone Call Additional follow up Details #3:: Details for Additional Follow-up Action Taken: Rx's faxed to Rx outreach  Glendell Docker [facility]  July 14, 2010 5:32 PM  Additional Follow-up by: Glendell Docker CMA,   July 14, 2010 5:32 PM  Prescriptions: CARVEDILOL 25 MG TABS (CARVEDILOL) 1/2 tablet by mouth two times a day  #90 x 1   Entered by:   Glendell Docker CMA   Authorized by:   [name] DO   Signed by:   Glendell Docker CMA on 07/14/2010   Method used:   Printed then faxed to ...       [facility]  [address] [id number]       [address]       [place], [place]  [address]       Ph: [phone]       Fax: [phone]   RxID:   [id number] TRIBENZOR 40-5-12.5 MG TABS [facility]) 1/2 by mouth once daily  #90 x 1   Entered by:   Glendell Docker CMA   Authorized by:   [name] DO   Signed by:   Glendell Docker CMA on 07/14/2010   Method used:   Printed then faxed to ...       [facility]  [address] [id number]       [address]       [place], [place]  [address]       Ph: [phone]  Fax: [phone]   RxID:   [id number] METFORMIN HCL 500 MG  TB24 (METFORMIN HCL) Take 2 tablet by mouth two times a day  #360 x 1   Entered by:   Glendell Docker CMA   Authorized by:   [name] DO   Signed by:   Glendell Docker CMA on 07/14/2010   Method used:   Printed then faxed to ...       [facility]  [address] [id number]       [address]       [place], [place]  [address]       Ph: [phone]       Fax: [phone]   RxID:   [id number] SIMVASTATIN 40 MG TABS (SIMVASTATIN) one by mouth qpm  #90 x 1   Entered by:   Glendell Docker CMA   Authorized by:   [name] DO   Signed by:   Glendell Docker CMA on 07/14/2010   Method used:   Printed then faxed to ...       [facility]  [address] [id number]       [address]       [place], [place]  [address]       Ph: [phone]       Fax: [phone]   RxID:   [id number] CLONIDINE HCL 0.2 MG TABS (CLONIDINE HCL) Take 1/2  tablet by mouth twice a day  #180 x 1   Entered by:   Glendell Docker  CMA   Authorized by:   [name] DO   Signed by:   Glendell Docker CMA on 07/14/2010   Method used:   Printed then faxed to ...       [facility]  [address] [id number]       [address]       [place], [place]  [address]       Ph: [phone]       Fax: [phone]   RxID:   [id number]
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
## 2010-12-07 NOTE — Progress Notes (Signed)
Summary: low BP, dizzy spells,  feels tired  Phone Note Call from Patient   Caller: Spouse Call For: [name] DO Details for Reason: low Bp, Dizzy spells, and feels tired Summary of Call: Pt. states that his BP is running 68/48, has dizzy spells, feels tired, and wanted Dr.Deena Shaub to know that pt. is going to come in and see Melissa O'sullivan at 1:45 today since Dr.Charlis Harner will be not be able to see him today. Initial call taken by: Michaelle Copas,  November 16, 2009 11:05 AM  Follow-up for Phone Call        hold BP meds and increase fluids.  make sure pt sees Melissa this afternoon Follow-up by: [name] DO,  November 16, 2009 11:12 AM  Additional Follow-up for Phone Call Additional follow up Details #1::        Spoke with pt's. wife and she states that MR. [name]  had already took his B/P meds for today. He took them around 7 this A.M.... Pt states that he has been drinking plenty of fluids and definitely will see Sandford Craze at 1:45 Today. Thank you, Victorino Dike Additional Follow-up by: Michaelle Copas,  November 16, 2009 11:38 AM

## 2010-12-07 NOTE — Assessment & Plan Note (Signed)
Summary: 68/48 BP, feels dizzy spells, tired, -Dr.Yoo, JR   Vital Signs:  Patient profile:   65 year old male Weight:      244 pounds O2 Sat:      92 % Pulse rate:   97 / minute BP sitting:   142 / 93  (left arm)  Vitals Entered By: Doristine Devoid (November 16, 2009 2:14 PM) CC: BP low x1 week some dizzy spells and has been feeling tired    Primary Care Provider:  DThomos Lemons DO  CC:  BP low x1 week some dizzy spells and has been feeling tired .  History of Present Illness: Mr Petrucelli is a 65 year old male who presents with 3-4 day history of feeling tired and dizzy. He denies dizziness at time of the visit however.   Notes that his BP was 68/48 on his BP monitor at home this AM. This reading was 3 hours after he took his pills.   Patient notes that he has had mild diarrhea from victoza.  Denies nausea or vomitting.  Patient also notes that he has had intermittent  discomfort in his chest which is worse when he lays flat, and then better when he sits up.  Patient notes minimal improvement in his chest discomfort with Zantac or Tums.  Denies associated SOB.  There is not an increase in this discomfort with exertion.  Reviewed patient's records- he had a negative cardiac catheterization in 2006.  Notes that he cannot afford victoza.  Allergies: No Known Drug Allergies  Family History: Reviewed history from 10/07/2009 and no changes required. Mother deceased at age 56 secondary to complications of diabetes Father deceased at age 59 secondary to lung cancer and stroke     Social History: Reviewed history from 10/07/2009 and no changes required. patient was terminated from his employer.  He has not been able to find new job Married   Former Smoker Alcohol use-yes (social)    Review of Systems       +Dizziness, Denies focal weakness- notes + generalized weakness, increase fatigue.    Physical Exam  General:  Obese white male in NAD Head:  Normocephalic and atraumatic without  obvious abnormalities. No apparent alopecia or balding. Ears:  External ear exam shows no significant lesions or deformities.  Otoscopic examination reveals clear canals, tympanic membranes are intact bilaterally without bulging, retraction, inflammation or discharge. Hearing is grossly normal bilaterally. Neck:  No deformities, masses, or tenderness noted. Lungs:  Normal respiratory effort, chest expands symmetrically. Lungs are clear to auscultation, no crackles or wheezes. Heart:  Normal rate and regular rhythm. S1 and S2 normal without gallop, murmur, click, rub or other extra sounds.   Impression & Recommendations:  Problem # 1:  GERD (ICD-530.81) Assessment New I suspect that patients chest discomfort is GI related.  Symptoms are worse when laying flat.  Improve with burping and upright position.  Will give trial of PPI.  EKG ordered and reviewed.  Sinus rythm with ?1st degree AV block.  No EKG available for comparison at time of exam- however reviewed cardiology notes- was discharged for [name] cardiology 2008 by Dr. Samule Ohm for continued risk factor modification following a negative cardiac catheterization 2006.  The patient was instructed to go to the ER if he develops chest pain which is not relieved by Tums or positional changes.   His updated medication list for this problem includes:    Prilosec Otc 20 Mg Tbec (Omeprazole magnesium) ..... One tablet by mouth  daily  Orders: EKG w/ Interpretation (93000)  Problem # 2:  HYPERTENSION (ICD-401.9) Assessment: Deteriorated Noted patient's complaint of dizziness.  He only has experienced this after taking his blood pressure meds and it has been associated with documented hypotension based on his home readings.  He denies any associated focal weakness, slurred speech or facial drooping.  Given patient's associated hypotension I do not feel that this dizzines is likely due to a neurovascular event.  There is no significant orthostatic  hypotension today in the office: 140/90 laying 130/90 sitting 130/90 standing  I recommended to the patient that he cut his Coreg in half and continue at 12.5 mg by mouth twice daily, and that he check his blood pressure prior to taking blood pressure meds.  He is to hold BP meds if BP less than 110/80 and to check and document BP if he is symptomatic.  He is to follow up with Dr. Artist Pais in 2 weeks and bring these readings with him to his appointment.   The following medications were removed from the medication list:    Benicar Hct 20-12.5 Mg Tabs (Olmesartan medoxomil-hctz) .[name]... 1/2 by mouth once daily His updated medication list for this problem includes:    Clonidine Hcl 0.2 Mg Tabs (Clonidine hcl) .[name]... Take 1/2  tablet by mouth twice a day    Carvedilol 25 Mg Tabs (Carvedilol) ..... One half tablet by mouth two times a day    Tribenzor 40-10-25 Mg Tabs (Olmesartan-amlodipine-hctz) .[name]... Take one half tablet daily  Problem # 3:  DIABETES MELLITUS, TYPE II (ICD-250.00) Assessment: Improved Patient brought fasting CBG records- generally 110-130 since early December.  I recommended that he also check some post-prandial readings.  Unfortunately he is unable to afford victoza and we did not have any samples at the time of his visit.   The following medications were removed from the medication list:    Benicar Hct 20-12.5 Mg Tabs (Olmesartan medoxomil-hctz) .[name]... 1/2 by mouth once daily His updated medication list for this problem includes:    Low-dose Aspirin 81 Mg Tabs (Aspirin) .[name]... Take 1 tablet by mouth once a day    Metformin Hcl 500 Mg Tb24 (Metformin hcl) .[name]... Take 2 tablet by mouth two times a day    Victoza 18 Mg/48ml Soln (Liraglutide) ..... Inject 1.2 mg  subcutaneously once daily as directed    Tribenzor 40-10-25 Mg Tabs (Olmesartan-amlodipine-hctz) .[name]... Take one half tablet daily  Complete Medication List: 1)  Clonidine Hcl 0.2 Mg Tabs (Clonidine hcl) .... Take 1/2  tablet by  mouth twice a day 2)  Freestyle Lancets Misc (Lancets) .... Use 1 stick as directed once a day 3)  Freestyle Lite Strp (Glucose blood) .... Use 1 strip as directed 4)  Simvastatin 80 Mg Tabs (Simvastatin) .... Take 1 tablet by mouth at bedtime 5)  Carvedilol 25 Mg Tabs (Carvedilol) .... One half tablet by mouth two times a day 6)  Low-dose Aspirin 81 Mg Tabs (Aspirin) .... Take 1 tablet by mouth once a day 7)  Fish Oil 1000 Mg Caps (Omega-3 fatty acids) .... Take 1 tablet by mouth once a day 8)  Metformin Hcl 500 Mg Tb24 (Metformin hcl) .... Take 2 tablet by mouth two times a day 9)  Multivitamins Tabs (Multiple vitamin) .... Take 1 tablet by mouth once a day 10)  Calcium 600/vitamin D 600-400 Mg-unit Tabs (Calcium carbonate-vitamin d) .... Take 1 tablet by mouth once a day 11)  Victoza 18 Mg/61ml Soln (Liraglutide) .[name] KitchenMarland KitchenMarland Kitchen  Inject 1.2 mg  subcutaneously once daily as directed 12)  Bd Pen Needle Nano U/f 32g X 4 Mm Misc (Insulin pen needle) .... Use for insulin injection once daily ( dx code 250.00) 13)  Tribenzor 40-10-25 Mg Tabs (Olmesartan-amlodipine-hctz) .... Take one half tablet daily 14)  Prilosec Otc 20 Mg Tbec (Omeprazole magnesium) .... One tablet by mouth daily  Patient Instructions: 1)  If you develop chest discomfort which is not relieved by Tums or sitting upright, please go directly to the ER.  Cut your Carvedilol tablets in half and take 1/2 tab twice daily.  Take your blood pressure before you take your meds in the AM.  If your pressure is less than 110/80, do not take medicine.   2)  Please schedule a follow-up appointment in 2 weeks.

## 2010-12-08 NOTE — Medication Information (Signed)
Summary: Patient Assistance Applications/[employer]   Patient Assistance Applications/[employer] & [employer]   Imported By: Lanelle Bal 04/08/2009 09:54:14  _____________________________________________________________________  External Attachment:    Type:   Image     Comment:   External Document

## 2010-12-14 ENCOUNTER — Encounter: Payer: Self-pay | Admitting: Internal Medicine

## 2011-01-05 ENCOUNTER — Telehealth: Payer: Self-pay | Admitting: Internal Medicine

## 2011-01-10 ENCOUNTER — Telehealth: Payer: Self-pay | Admitting: Internal Medicine

## 2011-01-11 NOTE — Progress Notes (Signed)
Summary: refill-tribenzor  Phone Note Refill Request Message from:  Fax from Pharmacy on January 05, 2011 9:29 AM  Refills Requested: Medication #1:  tribenzor 40-5-12.5 mg tablet take 1 tablet every day   Brand Name Necessary? No   Supply Requested: 3 months   Last [facility]: [facility] [address] [place] [address] fax [phone]   Method Requested: Electronic Next Appointment Scheduled: 4.16.12 Jada Kuhnert Initial call taken by: Elba Barman,  January 05, 2011 9:31 AM  Follow-up for Phone Call        Rx completed in Dr. Tiajuana Amass Follow-up by: Glendell Docker CMA,  January 05, 2011 11:44 AM    Prescriptions: Marya Landry 40-5-12.5 MG TABS [facility]) 1/2 by mouth once daily  #30 x 0   Entered by:   Glendell Docker CMA   Authorized by:   [name] DO   Signed by:   Glendell Docker CMA on 01/05/2011   Method used:   [facility]  [address] [id number]       [address]       [place], [place]  [address]       Ph: [phone]       Fax: [phone]   RxID:   [id number]

## 2011-01-16 NOTE — Progress Notes (Signed)
Summary: Metformin Refill  Phone Note Call from Patient Call back at Home Phone [phone]   Caller: Spouse-Shirley Call For: [name] DO Summary of Call: patients wife called and left voice message requesting a rx refill on Metformin. She states patient will run out soon. She is requesting a rx sent to Rx outreach. Phone number is [phone]/ Fax [phone] Initial call taken by: Glendell Docker CMA,  January 10, 2011 3:58 PM  Follow-up for Phone Call        Rx printed and forwarded to Dr Artist Pais for signature Follow-up by: Glendell Docker CMA,  January 10, 2011 4:06 PM  Additional Follow-up for Phone Call Additional follow up Details #1::        Rx signed and faxed to  Rx Outreach at  [phone] Additional Follow-up by: Glendell Docker CMA,  January 11, 2011 1:46 PM    New/Updated Medications: METFORMIN HCL 500 MG  TB24 (METFORMIN HCL) Take 2 tablet by mouth two times a day Prescriptions: METFORMIN HCL 500 MG  TB24 (METFORMIN HCL) Take 2 tablet by mouth two times a day  #360 x 1   Entered by:   Glendell Docker CMA   Authorized by:   [name] DO   Signed by:   Glendell Docker CMA on 01/10/2011   Method used:   Printed then faxed to ...       [facility]  [address] [id number]       [address]       [place], [place]  [address]       Ph: [phone]       Fax: [phone]   RxID:   [id number]

## 2011-01-24 LAB — CBC
HCT: 36.5 % — ABNORMAL LOW (ref 39.0–52.0)
HCT: 41.6 % (ref 39.0–52.0)
Hemoglobin: 12.6 g/dL — ABNORMAL LOW (ref 13.0–17.0)
Hemoglobin: 14.1 g/dL (ref 13.0–17.0)
MCHC: 34 g/dL (ref 30.0–36.0)
MCHC: 34.5 g/dL (ref 30.0–36.0)
MCV: 95.7 fL (ref 78.0–100.0)
MCV: 96.6 fL (ref 78.0–100.0)
Platelets: 275 10*3/uL (ref 150–400)
Platelets: 316 10*3/uL (ref 150–400)
RBC: 3.81 MIL/uL — ABNORMAL LOW (ref 4.22–5.81)
RBC: 4.31 MIL/uL (ref 4.22–5.81)
RDW: 14.1 % (ref 11.5–15.5)
RDW: 14.1 % (ref 11.5–15.5)
WBC: 10.8 10*3/uL — ABNORMAL HIGH (ref 4.0–10.5)
WBC: 7.7 10*3/uL (ref 4.0–10.5)

## 2011-01-24 LAB — BASIC METABOLIC PANEL
BUN: 18 mg/dL (ref 6–23)
CO2: 26 mEq/L (ref 19–32)
Calcium: 9.1 mg/dL (ref 8.4–10.5)
Chloride: 106 mEq/L (ref 96–112)
Creatinine, Ser: 0.95 mg/dL (ref 0.4–1.5)
GFR calc Af Amer: >60 mL/min (ref >60)
GFR calc non Af Amer: >60 mL/min (ref >60)
Glucose, Bld: 247 mg/dL — ABNORMAL HIGH (ref 70–99)
Potassium: 4.3 mEq/L (ref 3.5–5.1)
Sodium: 142 mEq/L (ref 135–145)

## 2011-01-24 LAB — CULTURE, BLOOD (ROUTINE X 2)
Culture: NO GROWTH
Culture: NO GROWTH

## 2011-01-24 LAB — GLUCOSE, CAPILLARY
Glucose-Capillary: 153 mg/dL — ABNORMAL HIGH (ref 70–99)
Glucose-Capillary: 215 mg/dL — ABNORMAL HIGH (ref 70–99)
Glucose-Capillary: 248 mg/dL — ABNORMAL HIGH (ref 70–99)
Glucose-Capillary: 252 mg/dL — ABNORMAL HIGH (ref 70–99)
Glucose-Capillary: 263 mg/dL — ABNORMAL HIGH (ref 70–99)
Glucose-Capillary: 293 mg/dL — ABNORMAL HIGH (ref 70–99)

## 2011-01-24 LAB — DIFFERENTIAL
Basophils Absolute: 0 10*3/uL (ref 0.0–0.1)
Basophils Relative: 0 % (ref 0–1)
Eosinophils Absolute: 0 10*3/uL (ref 0.0–0.7)
Eosinophils Relative: 0 % (ref 0–5)
Lymphocytes Relative: 25 % (ref 12–46)
Lymphs Abs: 1.9 10*3/uL (ref 0.7–4.0)
Monocytes Absolute: 0 10*3/uL — ABNORMAL LOW (ref 0.1–1.0)
Monocytes Relative: 1 % — ABNORMAL LOW (ref 3–12)
Neutro Abs: 5.6 10*3/uL (ref 1.7–7.7)
Neutrophils Relative %: 74 % (ref 43–77)

## 2011-01-24 LAB — HEMOGLOBIN A1C
Hgb A1c MFr Bld: 6.8 % — ABNORMAL HIGH (ref 4.6–6.1)
Mean Plasma Glucose: 148 mg/dL

## 2011-01-24 LAB — LACTIC ACID, PLASMA: Lactic Acid, Venous: 4.9 mmol/L — ABNORMAL HIGH (ref 0.5–2.2)

## 2011-02-13 ENCOUNTER — Other Ambulatory Visit: Payer: Self-pay

## 2011-02-13 ENCOUNTER — Other Ambulatory Visit: Payer: Self-pay | Admitting: Internal Medicine

## 2011-02-13 DIAGNOSIS — E119 Type 2 diabetes mellitus without complications: Secondary | ICD-10-CM

## 2011-02-13 DIAGNOSIS — I10 Essential (primary) hypertension: Secondary | ICD-10-CM

## 2011-02-19 ENCOUNTER — Encounter: Payer: Self-pay | Admitting: Internal Medicine

## 2011-02-19 ENCOUNTER — Ambulatory Visit (INDEPENDENT_AMBULATORY_CARE_PROVIDER_SITE_OTHER): Payer: Self-pay | Admitting: Internal Medicine

## 2011-02-19 DIAGNOSIS — E119 Type 2 diabetes mellitus without complications: Secondary | ICD-10-CM

## 2011-02-19 DIAGNOSIS — I10 Essential (primary) hypertension: Secondary | ICD-10-CM

## 2011-02-19 DIAGNOSIS — E785 Hyperlipidemia, unspecified: Secondary | ICD-10-CM

## 2011-02-19 MED ORDER — SIMVASTATIN 40 MG PO TABS: 20.0000 mg | ORAL_TABLET | Freq: Every evening | ORAL | Status: DC

## 2011-02-19 MED ORDER — CARVEDILOL 25 MG PO TABS: 12.5000 mg | ORAL_TABLET | Freq: Two times a day (BID) | ORAL | Status: DC

## 2011-02-19 MED ORDER — FEXOFENADINE HCL 180 MG PO TABS: 180.0000 mg | ORAL_TABLET | Freq: Every day | ORAL | Status: DC

## 2011-02-19 MED ORDER — OLMESARTAN-AMLODIPINE-HCTZ 40-5-12.5 MG PO TABS: 0.5000 | ORAL_TABLET | Freq: Every day | ORAL | Status: DC

## 2011-02-19 MED ORDER — CLONIDINE HCL 0.2 MG PO TABS: 0.1000 mg | ORAL_TABLET | Freq: Two times a day (BID) | ORAL | Status: DC

## 2011-02-19 MED ORDER — METFORMIN HCL 500 MG PO TABS: ORAL_TABLET | ORAL | Status: DC

## 2011-02-19 NOTE — Progress Notes (Signed)
Subjective:    Patient ID: [name], male    DOB: 1946/04/09, 65 y.o.   MRN: [id number]  Diabetes He presents for his follow-up diabetic visit. He has type 2 diabetes mellitus. His disease course has been stable. There are no hypoglycemic associated symptoms. Pertinent negatives for diabetes include no chest pain, no foot paresthesias and no visual change. There are no hypoglycemic complications. Risk factors for coronary artery disease include hypertension, male sex and obesity. Current diabetic treatment includes oral agent (monotherapy). He is compliant with treatment most of the time. His weight is stable. He is following a generally healthy diet. He monitors blood glucose at home 1-2 x per day. Blood glucose monitoring compliance is fair. An ACE inhibitor/angiotensin II receptor blocker is being taken.      Review of Systems  Cardiovascular: Negative for chest pain.    Past Medical History  Diagnosis Date  . Hypertension   . Stroke   . Arthritis   . Hyperlipidemia   . History of nephrolithiasis   . PVD (peripheral vascular disease)     right carotid artery  . History of AAA (abdominal aortic aneurysm) repair 2008    3 cm  . Bulging disc 1984    back    History   Social History  . Marital Status: Married    Spouse Name: N/A    Number of Children: N/A  . Years of Education: N/A   Occupational History  . unemployed    Social History Main Topics  . Smoking status: Former Games developer  . Smokeless tobacco: Not on file  . Alcohol Use: Yes     social  . Drug Use: Not on file  . Sexually Active: Not on file   Other Topics Concern  . Not on file   Social History Narrative  . No narrative on file    Past Surgical History  Procedure Date  . Carotid endarterectomy 2006    right carotid artery    Family History  Problem Relation Age of Onset  . Diabetes Mother   . Cancer Father     lung  . Stroke Father     No Known Allergies  Current Outpatient  Prescriptions on File Prior to Visit  Medication Sig Dispense Refill  . aspirin 81 MG tablet Take 81 mg by mouth daily.        . Calcium Carbonate-Vit D-Min (CALCIUM 600+D PLUS MINERALS) 600-400 MG-UNIT TABS Take 1 tablet by mouth daily.        . carvedilol (COREG) 25 MG tablet Take 12.5 mg by mouth 2 (two) times daily with meals.        . cloNIDine (CATAPRES) 0.2 MG tablet Take 0.1 mg by mouth 2 (two) times daily.        . fish oil-omega-3 fatty acids 1000 MG capsule Take 2 g by mouth daily.        [name] glucose blood (FREESTYLE LITE) test strip Use 1 strip as instructed       . Insulin Pen Needle (RELION PEN NEEDLE 31G/8MM) 31G X 8 MM MISC by Does not apply route. Use once daily as directed.       . Lancets (FREESTYLE) lancets Use as instructed once a day.       . Liraglutide (VICTOZA) 18 MG/3ML SOLN Inject 1.2 mg into the skin daily.        . metFORMIN (GLUCOPHAGE) 500 MG tablet Take 1,000 mg by mouth 2 (two) times daily with meals.        [name]  Multiple Vitamin (MULTIVITAMIN) tablet Take 1 tablet by mouth daily.        . Olmesartan-Amlodipine-HCTZ (TRIBENZOR) 40-5-12.5 MG TABS Take 0.5 tablets by mouth daily.        . simvastatin (ZOCOR) 40 MG tablet Take 40 mg by mouth every evening.        . fexofenadine (ALLEGRA) 180 MG tablet Take 180 mg by mouth daily.          BP 144/80  Pulse 96  Temp(Src) 98.5 F (36.9 C) (Oral)  Resp 20  Ht 5\' 7"  (1.702 m)  Wt 238 lb (107.956 kg)  BMI 37.28 kg/m2       Objective:   Physical Exam  Constitutional: He appears well-developed and well-nourished.  HENT:  Head: Normocephalic and atraumatic.  Cardiovascular: Normal rate, regular rhythm and normal heart sounds.   Pulmonary/Chest: Effort normal and breath sounds normal. He has no wheezes. He has no rales.  Musculoskeletal: He exhibits edema.  Skin: Skin is dry.          Assessment & Plan:

## 2011-02-21 NOTE — Assessment & Plan Note (Signed)
BP: 132/80 mmHg   Stable. Continue current medication regimen.

## 2011-02-21 NOTE — Assessment & Plan Note (Signed)
Lab Results  Component Value Date   HGBA1C 6.4 04/17/2010    Monitor A1c.  No change in medications.

## 2011-03-20 NOTE — Procedures (Signed)
[name], [name] NO.:  [id number]   MEDICAL RECORD NO.:  [id number]          PATIENT TYPE:  OUT   LOCATION:  SLEEP CENTER                 FACILITY:  [facility]   PHYSICIAN:  Barbaraann Share, MD,FCCPDATE OF BIRTH:  1946-08-05   DATE OF STUDY:  03/26/2007                            NOCTURNAL POLYSOMNOGRAM   REFERRING PHYSICIAN:  Barbette Hair. Artist Pais, DO   INDICATION FOR STUDY:  Hypersomnia with sleep apnea.   EPWORTH SLEEPINESS SCORE:  10.   MEDICATIONS:   SLEEP ARCHITECTURE:  The patient had a total sleep time of 250 minutes  with decreased REM and never achieved slow wave sleep.  Sleep onset  latency is prolonged at 40 minutes, and REM onset is prolonged as well  at 110 minutes.  Sleep efficiency is very decreased at 62%.   RESPIRATORY DATA:  The patient was found to have 11 hypopneas and no  apneas, for an apnea-hypopnea index of 2.6 events per hour.  The events  were not positional but there was mild to moderate snoring noted  throughout.   OXYGEN DATA:  There was O2 desaturation as low as 83% with the patient's  obstructive events.   CARDIAC DATA:  Frequent PVCs were noted throughout.   MOVEMENT-PARASOMNIA:  The patient was found to have 30 leg jerks during  the night with 6 per hour resulting in arousal or awakening.  There were  no abnormal behaviors noted.   IMPRESSIONS-RECOMMENDATIONS:  1. Small numbers of obstructive events which do not meet the apnea-      hypopnea index criteria for the obstructive sleep apnea syndrome.      Patient did have decreased total sleep time along with decreased      slow wave sleep and rapid eye movement and, therefore, his degree      of sleep apnea may be somewhat underestimated.  However, he is      clearly not in the moderate to severe category even if he does have      some degree of obstructive sleep apnea.  2. Frequent premature ventricular contractions noted throughout,      clinical correlation is suggested.  3. Small numbers of leg jerks but with significant sleep disruption.      Clinical correlation is suggested to see if there is anything in      the patient's history to suggest a primary movement disorder of      sleep.      Barbaraann Share, MD,FCCP  Diplomate, American Board of Sleep  Medicine  Electronically Signed     [facility]/MEDQ  D:  04/08/2007 16:01:46  T:  04/08/2007 17:09:58  Job:  [id number]
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
## 2011-03-23 NOTE — Assessment & Plan Note (Signed)
[facility]                              CARDIOLOGY OFFICE NOTE   [name], [name]                        MRN:          [id number]  DATE:08/21/2006                            DOB:          [birth year]    PRIMARY CARE PHYSICIAN:  Rudean Hitt, MD.   HISTORY OF PRESENT ILLNESS:  [name] is a 65 year old gentleman with  cerebrovascular disease and hypertension.  He says he is walking on his  treadmill fairly regularly.  His weight continues to rise.  He says he is  watching his diet.  However, asks about he and his wife going on the [employer].   He denies any chest discomfort, orthopnea, PND, exertional dyspnea and  claudication.   CURRENT MEDICATIONS:  1. Crestor 10 mg per day.  2. Zetia 10 mg per day.  3. Niacin 250 mg per day.  4. Aspirin 325 mg per day.  5. Clonidine 0.3 mg twice per day.  6. Toprol XL 100 mg per day.  7. Lisinopril hydrochlorothiazide 20/25 mg 1 per day.  8. Fish oil 1000 mg per day.  9. MiraLax.  10.Norvasc 10 mg per day.  11.Travatan eye drops.  12.Diltiazem hydrochloride 2% gel.   PHYSICAL EXAMINATION:  GENERAL:  He is generally well-appearing, in no  distress.  Heart rate is 78, blood pressure 130/82, equal bilaterally.  Weight is 250  pounds.  Since April he has gained 12 pounds, 3 since August.  He has no jugular venous distention and no thyromegaly.  Respiratory effort is normal.  LUNGS:  Clear to auscultation.  He has a nondisplaced point of maximal cardiac impulse.  There is a regular  rate and rhythm with normal S1 and S2.  There is no S3, but there is an S4.  ABDOMEN:  Obese and soft.  No hepatosplenomegaly or tenderness.  No midline  pulsatile mass and no abdominal bruits.  EXTREMITIES:  Warm without cyanosis, clubbing, edema or ulceration.  Carotid  pulses are 2+ bilaterally with a bruit on the right.   STUDIES:  Urinalysis showed no proteinuria.  Creatinine is 0.9.  Urine  metanephrines are  normal.   CT angiogram demonstrated less than 50% bilateral renal artery stenosis,  with a 3 cm fusiform abdominal aortic aneurysm and nonobstructing bilateral  renal calculi.   IMPRESSION/RECOMMENDATIONS:  1. Hypertension:  Blood pressure control is a bit better.  Evaluation for      secondary causes has been unrevealing.  I think his weight is a primary      culprit.  For that reason, I have told him that I think it is      reasonable to pursue the Atkins diet.  Set a goal of a 20 pound weight      loss over the next 6 months.  2. Hypercholesterolemia:  Goal LDL less than 70 due to his atherosclerotic      carotid disease.  Managed by Dr. Renato Gails.  3. Diabetes mellitus:  Managed by Dr. Renato Gails and Dr. Karilyn Cota.  4. Carotid disease:  Managed by Dr. Edilia Bo.  5.  Disposition:  We will see him back in 6 months' time for reinforcement      of lifestyle modification.       Salvadore Farber, MD     WED/MedQ  DD:  08/21/2006  DT:  08/23/2006  Job #:  [id number]   cc:   Rudean Hitt, MD

## 2011-03-23 NOTE — Consult Note (Signed)
NAMEPABLO, [name]                 ACCOUNT NO.:  [id number]   MEDICAL RECORD NO.:  [id number]          PATIENT TYPE:  INP   LOCATION:  3714                         FACILITY:  [facility]   PHYSICIAN:  Donald P. Pearlean Brownie, MD    DATE OF BIRTH:  16-May-1946   DATE OF CONSULTATION:  DATE OF DISCHARGE:                                   CONSULTATION   REFERRED BY:  Dr. Corliss Skains and Dr. Algie Coffer.   REASON FOR CONSULTATION:  Carotid stenosis and TIA.   HISTORY OF PRESENT ILLNESS:  [name] is a 65 year old Caucasian male who  developed an episode of sudden onset of dizziness along with seeing floaters  in the right eye with a curtain of phenomena in the lower portion of the  right eye with transient loss of vision for not more than a few minutes.  This was also accompanied by numbness and tingling in the left hand as well  as lack of spatial awareness of the left hand and some weakness. These  symptoms recovered within a few minutes. He also felt he was off balance and  dizzy and light headed. He was seen at an outside hospital, [name] [place] was [place] there. A carotid  ultrasound showed bilateral carotid stenosis but he did have some cardiac  irregular ectopic beats and was transferred here to Dr. Roseanne Kaufman service  for cardiac evaluation. Subsequently, his diagnostic catheterization on May 07, 2004 showed no significant obstructive coronary artery disease. A repeat  cardiac ultrasound done here confirmed bilateral 80% left greater than right  carotid artery stenosis. A third angiogram was done earlier today which  showed 95% bilateral carotid stenosis at the bifurcation. There was also 7.5  x 8.5 mm right paraopthalmic aneurysm noted. The patient denies any known  prior history of stroke, TIA, seizure or significant neurological problems.   PAST MEDICAL HISTORY:  Significant for hypertension, chronic back pain.   PAST SURGICAL HISTORY:  Back  surgery and left knee surgery.   HOME MEDICATIONS PRIOR TO ADMISSION:  Lisinopril, hydrochlorothiazide and  Xalatan eye drops.   MEDICATION ALLERGIES:  None known.   SOCIAL HISTORY:  The patient lives in [place] and smokes one pack per  day for 30 plus years, denies drinking alcohol and doing drugs.   REVIEW OF SYMPTOMS:  Significant for recent blurred vision, numbness,  dizziness, chest pain, palpitations, shortness of breath or diarrhea.   PHYSICAL EXAMINATION:  GENERAL:  Reveals a Caucasian middle-aged gentlemen  not in distress.  VITAL SIGNS:  Afebrile. Pulse is 70 per minute and regular. Respiratory rate  16 per minute, distal pulses well felt.  HEENT:  Head is normocephalic. ENT exam is unremarkable.  NECK:  Supple. Examination of the neck revealed left supple carotid bruit.  Left superficial temporal pulses are not felt. There is no __________ heard.  CARDIAC:  No murmur or gallop.  NEUROLOGIC:  The patient is pleasant, awake, alert, cooperative. There is no  aphasia, dysarthria or apraxia. Eye movements are full range without  nystagmus. Visual fields are adequate. Face  is symmetric, bilateral  movements are normal. Tongue is midline. Motor system exam reveals no upper  extremity drift, symmetric proximal strength in upper extremities. There is  minimally weak left grip and fine finger movements are diminished on the  left.  __________ orbits right over the left upper extremity. The lower  extremity exam reveals symmetric strength, tone, reflexes, coordination,  sensation. Plantars are both down going. Finger-to-nose and knee-to-heel  coordination are accurate. His gait was not tested.   LABORATORY DATA:  Carotid ultrasound done two days ago reveals severe  calcific plaques at both carotid bifurcations with greater than 80% left  more than right carotid stenosis by ultrasound criteria. Cerebral angiogram  done earlier today _________ revealed 95% left more than right  carotid  stenosis at the bifurcation and involving proximal ICA. The left external  carotid artery is occluded. A right paraopthalmic aneurysm is noted 7.5 x  8.5 mm in size. Cholesterol profile is elevated with LDL of 170, total  cholesterol 234. Hemoglobin A1c is normal.   IMPRESSION:  A 65 year old gentleman with significant multiple risk factors  of hypertension, hyperlipidemia and smoking with transient episode of right  eye and left hand dysfunction likely due to right hemispheric TIA  symptomatic from right carotid stenosis.   PLAN:  I had a long discussion with the patient and his wife and daughter  with regards to symptomatic carotid stenosis and treatment options with  revascularizations. We discussed carotid endarterectomy with angioplasty  stenting. In low surgical risk patients, carotid endarterectomy is still the  preferred treatment for symptomatic carotid stenosis. The patient seems to  have a preference for carotid angioplasty and stenting; however, ongoing  clinical trials will address the issue of which of the two modalities is  superior.   The recommendations would be in favor of carotid endarterectomy until  additional information is obtained from the ongoing CREST trial. I would  recommend consulting vascular surgery for emergent right carotid  endarterectomy. He may undergo elective left carotid endarterectomy in two  months time. Obtain MRI scan of the brain to image for acute infarct or  silent infarcts. Checking fasting homocystine levels. Agree with Statin for  his elevated cholesterol. Continue on aspirin for secondary stroke  prevention. I do not see any added benefit of Plavix to the aspirin and this  may be discontinued after the carotid surgery. The patient has agreed to  quit smoking. I would recommend smoking cessation consult and strict control  of hypertension and hyperlipidemia as well. Thank you for the referral.      PPS/MEDQ  D:  05/09/2005   T:  05/09/2005  Job:  [id number]

## 2011-03-23 NOTE — Op Note (Signed)
[name], [name] NO.:  [id number]   MEDICAL RECORD NO.:  [id number]          PATIENT TYPE:  INP   LOCATION:  2854                         FACILITY:  [facility]   PHYSICIAN:  Di Kindle. Edilia Bo, M.D.DATE OF BIRTH:  1946-09-06   DATE OF PROCEDURE:  06/28/2005  DATE OF DISCHARGE:                                 OPERATIVE REPORT   PREOPERATIVE DIAGNOSIS:  Asymptomatic greater than 80% left carotid  stenosis.   POSTOPERATIVE DIAGNOSIS:  Asymptomatic greater than 80% left carotid  stenosis.   PROCEDURE:  Left carotid endarterectomy with Dacron patch angioplasty.   SURGEON:  Edilia Bo.   ASSISTANT:  Rowe Clack, P.A.-C.   ANESTHESIA:  General.   INDICATIONS:  This is a 65 year old gentleman who presented with a  symptomatic right carotid stenosis. He had undergone right carotid  endarterectomy in July of this year. His preoperative duplex showed  bilateral high-grade carotid stenoses but the right one was symptomatic and  this was addressed first. He presents now for staged left carotid  endarterectomy. Given that the stenosis was also greater than 90%. The  procedure and potential complications had been discussed with the patient  preoperatively. All his questions were answered. He was agreeable to  proceed.   TECHNIQUE:  The patient was taken to the operating room after the arterial  line was placed by anesthesia. The patient received a general anesthetic.  The left neck was prepped and draped in usual sterile fashion. An incision  was made along the anterior border of the sternocleidomastoid and dissection  carried down to the common carotid artery which was dissected free and  controlled with a Rumel tourniquet. The facial vein was divided between two  2-0 silk ties. The internal carotid artery above the plaque was controlled  with a blue vessel loop. The external carotid artery was controlled with a  blue vessel loop and the superior thyroid artery  controlled 2-0 silk tie.  The patient was heparinized. Clamps were then placed on the internal and the  common and external carotid artery. A longitudinal arteriotomy was made in  the common carotid artery and this was extended to the plaque into the  internal carotid artery above the plaque. A 12 shunt was placed into the  internal carotid artery, back bled and placed into the common carotid artery  and secured with Rumel tourniquet. Flow was reestablished to the shunt. An  endarterectomy plane was established proximally. The plaque was sharply  divided. Eversion endarterectomy was performed of the external carotid  artery. Distally there was a nice taper in the plaque and no tacking sutures  were required. The artery was irrigated copious amounts of heparin and  dextran.  All loose debris removed. There was some thinning in the posterior  wall in one area which I repaired with a 6-0 Prolene suture. The Dacron  patch was then sewn using continuous 6-0 Prolene suture. Prior to completing  the patch closure, the shunt was removed. The artery was back bled and  flushed appropriately and anastomosis completed. Flow was reestablished  first to the external carotid  artery and then to the internal carotid  artery. At completion there was a good pulse distal to the patch and good  Doppler signal. Hemostasis was obtained in the wound and the heparin was  partially reversed with protamine. The wound  was closed with deep layer of 3-0 Vicryl. The platysma was closed with  running 3-0 Vicryl. The skin was closed with 4-0 subcuticular stitch.  Sterile dressing was applied. The patient tolerated procedure well was  transferred to recovery room in satisfactory condition. All needle and  sponge counts were correct.      Di Kindle. Edilia Bo, M.D.  Electronically Signed     CSD/MEDQ  D:  06/28/2005  T:  06/28/2005  Job:  [id number]

## 2011-03-23 NOTE — Op Note (Signed)
Donald, Park NO.:  [id number]   MEDICAL RECORD NO.:  [id number]          PATIENT TYPE:  INP   LOCATION:  3399                         FACILITY:  [facility]   PHYSICIAN:  Di Kindle. Edilia Bo, M.D.DATE OF BIRTH:  04-16-1946   DATE OF PROCEDURE:  DATE OF DISCHARGE:                                 OPERATIVE REPORT   PREOPERATIVE DIAGNOSIS:  Symptomatic right carotid stenosis.   POSTOPERATIVE DIAGNOSIS:  Symptomatic right carotid stenosis.   PROCEDURE:  Right carotid endarterectomy with Dacron patch angioplasty.   SURGEON:  Edilia Bo.   ASSISTANT:  Jerold Coombe, P.A.   ANESTHESIA:  General.   INDICATIONS:  This is a 65 year old gentleman who had been seen in  consultation by Dr. Madilyn Fireman on May 09, 2005. He had episode of left arm  weakness which had been improving and also some visual disturbances in the  right eye possibly consistent with right eye amaurosis fugax. He underwent  duplex scan which showed high-grade bilateral carotid stenoses and  subsequent cerebral arteriogram demonstrated 95% bilateral internal carotid  artery stenoses and a 7-8 mm aneurysm of the intracranial internal carotid  artery on the right adjacent to the ophthalmic artery. After extensive  discussion about the potential risks of surgery and the risks of not  operating it was elected to proceed with right carotid endarterectomy in  order to lower his risk of future stroke. The procedure and potential  complications were discussed with the patient in detail preoperatively. All  his questions were answered and he was agreeable to proceed.   TECHNIQUE:  The patient was taken to the operating room and received a  general anesthetic. An arterial line had been placed by anesthesia. The  right neck was prepped and draped in usual sterile fashion. An incision was  made along the anterior border of the sternocleidomastoid and dissection  carried down to the common carotid artery  which was dissected free and  controlled with Rumel tourniquet. Of note, there was soft plaque in the  common carotid artery and I had to extend the incision and go very low on  the common carotid artery to get below the plaque. Distally the bifurcation  was high.  I divided the facial vein between 2-0 silk ties and had to fully  mobilize the hypoglossal nerve in order to allow exposure of the internal  carotid artery above the plaque which was quite deep. It was also  complicated by the fact that the external carotid artery was rotated over  anteriorly, making access to the internal carotid artery more difficult;  however, was able to get a blue vessel loop around the internal carotid  artery. The patient was then heparinized.   The superior and external carotid arteries were controlled. The internal  carotid artery was clamped and then common carotid, external carotid  arteries were clamped. A longitudinal arteriotomy was made in the common  carotid artery and I extended this through the plaque in both directions. A  12 shunt was placed into the internal carotid artery back bled and placed in  the common carotid  artery and secured with Rumel tourniquet. Flow was  reestablished to the shunt. Of note, there was excellent backbleeding when I  placed the shunt.   An endarterectomy plane was established proximally and the plaque was  sharply divided. There was some slight irregularity of the more proximal  common carotid artery; however, it was clear that this is extended  indefinitely there was no really way to get below this. The endarterectomy  plane was established proximally. The plaque was divided and then the  dissection continued up the common carotid artery. Of note, the plaque was  very adherent to the subendothelium making it more difficult but I was able  to remove the plaque. Eversion endarterectomy was performed of the external  carotid artery. Distally I was able to taper the  plaque out and no tacking  sutures were required distally. This was very high. In order to allow  adequate visualization of the closure, however, I did have to remove the  shunt and the arteries were unclamped appropriately. There was good  backbleeding again. Once the endarterectomy was completed, although the  common carotid artery was quite large I elected to use a patch because the  internal carotid artery was not as large. A long Dacron patch was then sewn  using continuous 6-0 Prolene suture.   Prior to completing the patch closure, the arteries were back bled and  flushed appropriately and anastomosis completed. Flow was reestablished  first to the external carotid artery and into the internal carotid artery.  At completion there was a nice pulse and Doppler signal distal to the patch.  Hemostasis was obtained in the wound and  heparin was partially reversed  with protamine. The wound was closed with deep layer of 3-0 Vicryl. The  platysma was closed with running 3-0 Vicryl. The skin was closed with a 4-0  subcuticular stitch. Sterile dressing was applied. The patient tolerated  procedure well, was transferred to the recovery room in satisfactory  condition. All needle and sponge counts were correct.       CSD/MEDQ  D:  05/11/2005  T:  05/11/2005  Job:  [id number]   cc:   Ricki Rodriguez, M.D.  [address]  [place] [address]  Fax: [phone]   Shashi K. Pearlean Brownie, M.D.  [address]  [place]  [place] [address]  Fax: [phone]
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
## 2011-03-23 NOTE — H&P (Signed)
NAMECHRISTY, [name] NO.:  [id number]   MEDICAL RECORD NO.:  [id number]          PATIENT TYPE:  INP   LOCATION:                               FACILITY:  [facility]   PHYSICIAN:  Di Kindle. Edilia Bo, M.D.DATE OF BIRTH:  1946/04/07   DATE OF ADMISSION:  06/28/2005  DATE OF DISCHARGE:                                HISTORY & PHYSICAL   REASON FOR ADMISSION:  Asymptomatic critical left carotid stenosis.   HISTORY:  This is a 65 year old gentleman who had been seen in consultation  by Dr. Madilyn Fireman in the hospital on May 09, 2005.  He had an episode of left arm  weakness and visual disturbances in the right eye consistent with right eye  amaurosis fugax.  He underwent a Duplex scan which showed high grade  bilateral carotid stenoses and a subsequent arteriogram showed 95% bilateral  internal carotid artery stenoses.  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  Given that the right carotid was symptomatic, he  underwent right carotid endarterectomy with Dacron patch angioplasty on May 11, 2005.  Of note, it was very difficult to section the plaque extended very  high requiring removal of the shunt in order to completely remove the plaque  but he did well postoperatively.  He has had no further neurologic symptoms  since his surgery and comes in now to undergo elective left carotid  endarterectomy for this critical asymptomatic left carotid stenosis.  By  arteriogram, this was a 95% stenosis.   PAST MEDICAL HISTORY:  1.  Hypertension.  2.  Hypercholesterolemia.  3.  He has also had some problems with chronic low back pain.  4.  He denies any history of previous myocardial infarction or history of      congestive heart failure.  5.  He does apparently have a history of borderline diabetes.   FAMILY HISTORY:  There is no history of premature cardiovascular disease.   SOCIAL HISTORY:  He  is married and has three children.  He quit tobacco on  May 04, 2005.   MEDICATIONS:  1.  Lisinopril/hydrochlorothiazide 20/25 one p.o. daily.  2.  Xalatan ophthalmic one drop each eye each morning.  3.  Aspirin 325 mg p.o. every day.  4.  Toprol XL 50 mg p.o. daily.  5.  Crestor 10 mg p.o. q.h.s.   ALLERGIES:  No known drug allergies.   PHYSICAL EXAMINATION:  VITAL SIGNS:  Blood pressure is 142/82, heart rate is  76.  NECK:  He has a left carotid bruit.  LUNGS:  Clear bilaterally to auscultation.  CARDIAC:  He has a regular rate and rhythm.  ABDOMEN:  Soft and nontender.  EXTREMITIES:  He has palpable femoral pulses with warm well perfused feet  and no significant lower extremity swelling.  NEUROLOGIC:  Nonfocal.   Carotid Duplex scan shows a greater than 80% left carotid stenosis by  velocity criteria with an occluded external carotid artery.  Arteriogram  confirmed a 95% left carotid stenosis  at the bifurcation.  His bifurcation  is slightly high.   Given the 95% left carotid stenosis, I have recommended a left carotid  endarterectomy in order to lower his risk of a future stroke.  He has  undergone successful right carotid endarterectomy and he has had no problems  with hoarseness or problems swallowing to suggest nerve dysfunction.  We  have discussed the indications for left carotid endarterectomy and the  potential complications including, but not limited to, bleeding, stroke  (periprocedural risk 1-2%), nerve injury, MI, or other unpredictable medical  problems.  All of his questions were answered and he is agreeable to  proceed.  His surgery has been scheduled for July 03, 2005.      Di Kindle. Edilia Bo, M.D.  Electronically Signed     CSD/MEDQ  D:  06/20/2005  T:  06/20/2005  Job:  [id number]

## 2011-03-23 NOTE — Assessment & Plan Note (Signed)
[facility]                            CARDIOLOGY OFFICE NOTE   [name], [name]                        MRN:          [id number]  DATE:02/13/2007                            DOB:          June 17, 1946    PRIMARY CARE PHYSICIAN:  None.   HISTORY OF PRESENT ILLNESS:  [name] is a 65 year old gentleman with  cerebral vascular disease, hypertension, having abdominal obesity. He  has been unsuccessful in losing any weight despite what seems to be a  careful attention to his diet. His ability to exercise has been limited  by hip pain from what sounds like osteoarthritis. His cholesterol was  previously managed by his former primary care physician, who he is no  longer seeing. He presents today for followup.   CURRENT MEDICATIONS:  1. Crestor 10 mg daily.  2. Zetia 10 mg daily.  3. Niacin 250 mg daily.  4. Aspirin 325 mg daily.  5. Clonidine 0.3 mg twice daily.  6. Toprol XL 100 mg daily.  7. Lisinopril hydrochlorothiazide 20/25 one daily.  8. Fish oil 1000 mg daily.  9. MiraLax.  10.Norvasc 10 mg daily.   PHYSICAL EXAMINATION:  He is generally well-appearing, though  overweight, in no distress. Heart rate is 78, blood pressure 130/82 and  equal bilaterally. He weighs 250 pounds, which is up three pounds from  August and 12 pounds from April 2007.  He has no jugulovenous distention, thyromegaly or lymphadenopathy.  Respiratory effort is normal.  LUNGS:  Clear to auscultation.  He has a nondisplaced point of maximal cardiac impulse. There is a  regular rate and rhythm with a normal S1, S2. There is no S3, but there  is an S4.  ABDOMEN: Soft, obese, nondistended and nontender. There is no  hepatosplenomegaly or midline pulsatile midline mass.  EXTREMITIES: Are warm without clubbing, cyanosis, edema or ulceration.  Carotid pulses are 2+ bilaterally with a bruit on the right.   IMPRESSIONS/RECOMMENDATIONS:  1. Hypertension: I think his weight is  the primary culprit. We have      asked him to re-double his efforts at exercise and try to get this      under control. If unsuccessful over the next several months will      need to consider addition of another medication and/or up titration      of the lisinopril.  2. Hypercholesterolemia: Previously managed by Dr. Renato Gails. Since the      patient is no longer seeing Dr. Renato Gails, I will check lipids, LFTs and      high resolution CRP (the CRP is per the request of his insurer).  3. Diabetes mellitus: Refer to primary.  4. Carotid disease: Followed by Dr. Woodfin Ganja.  5. Abdominal aortic aneurysm: Recently noted on CT angiogram at 3-cm.      Will need repeat study in 1 years' time.  6. Follow up: The patient does not have any manifest cardiac disease.      Principally needs aggressive risk factor modification. I will refer      him to Dr. Thomos Lemons as he  needs to establish with a new primary      care physician. Emphasis will be on weight loss.     Salvadore Farber, MD  Electronically Signed    WED/MedQ  DD: 02/13/2007  DT: 02/13/2007  Job #: [id number]

## 2011-03-23 NOTE — Assessment & Plan Note (Signed)
[facility]                              CARDIOLOGY OFFICE NOTE   LEMAN, MARTINEK                        MRN:          [id number]  DATE:06/27/2006                            DOB:          11-26-1945    HISTORY OF PRESENT ILLNESS:  [name] is a 65 year old gentleman with  cerebrovascular disease and hypertension who presents for hypertension  follow up.  Blood pressure remains suboptimally controlled.  He has been  checking his blood pressure daily at home.  However, he cannot recall the  values and did not bring them with him.  He is getting essentially no  exercise despite repeated prior discussions about the critical importance of  this and helping with both his blood pressure control and his impotence.  He  is not having any chest discomfort, orthopnea, PND, edema or exertional  dyspnea.   CURRENT MEDICATIONS:  1. Crestor 10 mg daily.  2. Niacin 250 mg daily.  3. Aspirin 325 mg daily.  4. Lisinopril/HCTZ 20/25 one per day.  5. Glycolax.  6. Travatan eye drops.  7. Zetia 10 mg daily.  8. Clonidine 0.3 mg b.i.d.  9. Toprol XL 100 mg daily.  10.Diltiazem cream and fish oil.   PHYSICAL EXAMINATION:  GENERAL APPEARANCE:  He is obese, in no distress.  VITAL SIGNS:  Heart rate 80, blood pressure 138/80, weight 247 pounds,  weight is up 9 pounds from April and 5 pounds from June.  NECK:  No jugular venous distention.  No thyromegaly.  LUNGS:  Clear to auscultation.  HEART:  He has a nondisplaced point of maximal cardiac impulse.  There is a  regular rate and rhythm with a normal S1, S2.  There is no S3, but there is  an S4.  ABDOMEN:  Soft, obese without hepatosplenomegaly.  There is no midline  pulsatile mass and no abdominal bruits.  EXTREMITIES:  Warm without cyanosis, clubbing or edema or ulceration.  Carotid pulses are 2+ bilaterally with a bruit on the right.   IMPRESSION/RECOMMENDATIONS:  1. Hypertension.  Blood pressure is still  above goal.  Renal function      normal.  A 24-hour urine for metanephrine is normal.  Will check      urinalysis and CT angiogram for exclusion of proteinuria and renal      artery stenosis.  I think both of these are fairly unlikely.  I      stressed the critical importance of weight loss and exercise for the      long term control of his blood pressure.  I have stressed that he would      likely save substantial money if he would do this as we could probably      get him off a few medicines.  However, with continued suboptimal      control, will add Norvasc 10 mg per day to his current regimen.  2. Hypercholesterolemia.  History of atherosclerotic carotid disease.      Therefore, goal is LDL less than 70.  Continue Crestor and Zetia.  3. Diabetes mellitus, follow  up with Dr. Karilyn Cota.  4. Carotid disease, follow up with Dr. Edilia Bo.  5. Impotence.  Using Viagra.   DISPOSITION:  Will see him back in 8 weeks time.                                 Salvadore Farber, MD    WED/MedQ  DD:  06/27/2006  DT:  06/28/2006  Job #:  [id number]   cc:   Teressa Senter, M.D.

## 2011-03-23 NOTE — Discharge Summary (Signed)
NAMEWELLS, MABE NO.:  [id number]   MEDICAL RECORD NO.:  [id number]          PATIENT TYPE:  INP   LOCATION:  3305                         FACILITY:  [facility]   PHYSICIAN:  Di Kindle. Edilia Bo, M.D.DATE OF BIRTH:  11-May-1946   DATE OF ADMISSION:  06/28/2005  DATE OF DISCHARGE:  06/29/2005                                 DISCHARGE SUMMARY   HISTORY OF PRESENT ILLNESS:  The patient is a 65 year old gentleman who has  been seen in consultation by Dr. Madilyn Fireman in the hospital on May 09, 2005.  He  had an episode of left arm weakness and visual disturbance in the right eye  consistent with a right eye amaurosis fugax.  He underwent a duplex scan  which showed a high grade bilateral carotid stenosis and a subsequent  arteriogram showed 95% bilateral internal carotid artery stenosis.  Of note,  there is also a 7-to-8-mm aneurysm of the internal carotid artery on the  right adjacent to the ophthalmic artery.  Given that the right carotid  artery was symptomatic, he underwent a right carotid endarterectomy with  Dacron patch angioplasty in July 2006.  He has had no further neurological  symptoms since the surgery and comes in now to undergo elective left carotid  endarterectomy for the critical asymptomatic left carotid artery stenosis.   PAST MEDICAL HISTORY:  1.  Hypertension.  2.  Hypercholesterolemia.  3.  Chronic low back pain.  4.  Borderline diabetes.   MEDICATIONS PRIOR TO ADMISSION:  1.  Lisinopril/hydrochlorothiazide 20/25, one p.o. every day.  2.  Xalatan ophthalmic, one drop each eye each morning.  3.  Aspirin 325 mg daily.  4.  Toprol XL 50 mg daily.  5.  Crestor 10 mg q.h.s.   Family history, social history, review of symptoms, and physical exam please  see history and physical done at the time of admission.   HOSPITAL COURSE:  The patient was admitted electively, on June 28, 2005,  at which time he was taken to the operating room and underwent  the following  procedure, left carotid endarterectomy with Dacron patch angioplasty.  The  patient tolerated the procedure well, was taken to the post anesthesia care  unit in stable condition.   POSTOPERATIVE HOSPITAL COURSE:  The patient has done quite well.  He has  remained neurologically intact.  The incision is healing well.  He is  hemodynamically stable.  All routine monitoring devices have been  discontinued in a standard fashion.  He has advanced in a routine manner  regarding his postoperative activity and diet.  His overall status is felt  to be quite stable for discharge on today's date June 29, 2005.   MEDICATIONS ON DISCHARGE:  As preoperatively with the exception of Plavix  which is now to be stopped.  For pain Tylox, one to two q.4-6h. p.r.n. as  needed for pain.   FOLLOW UP:  Dr. Edilia Bo on July 18, 2005 at 1:40 p.m.   FINAL DIAGNOSIS:  Severe bilateral carotid artery disease as described with  hospital procedure of left carotid endarterectomy with Dacron  patch  angioplasty this admission.   Other diagnoses as previously listed per the history.   CONDITION ON DISCHARGE:  Stable and improving.      Rowe Clack, P.A.-C.      Di Kindle. Edilia Bo, M.D.  Electronically Signed    WEG/MEDQ  D:  06/29/2005  T:  06/29/2005  Job:  [id number]   cc:   Di Kindle. Edilia Bo, M.D.  [address]  [place]  [place] [address]   Rudean Hitt, Dr.
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
## 2011-05-14 LAB — HM DIABETES EYE EXAM: HM Diabetic Eye Exam: NORMAL

## 2011-06-04 ENCOUNTER — Other Ambulatory Visit (INDEPENDENT_AMBULATORY_CARE_PROVIDER_SITE_OTHER): Payer: Self-pay

## 2011-06-04 ENCOUNTER — Telehealth: Payer: Self-pay | Admitting: *Deleted

## 2011-06-04 DIAGNOSIS — I10 Essential (primary) hypertension: Secondary | ICD-10-CM

## 2011-06-04 DIAGNOSIS — E119 Type 2 diabetes mellitus without complications: Secondary | ICD-10-CM

## 2011-06-04 LAB — BASIC METABOLIC PANEL
BUN: 20 mg/dL (ref 6–23)
CO2: 30 mEq/L (ref 19–32)
Calcium: 9.7 mg/dL (ref 8.4–10.5)
Chloride: 104 mEq/L (ref 96–112)
Creatinine, Ser: 0.8 mg/dL (ref 0.4–1.5)
GFR: 99.2 mL/min (ref >60.00)
Glucose, Bld: 108 mg/dL — ABNORMAL HIGH (ref 70–99)
Potassium: 4.5 mEq/L (ref 3.5–5.1)
Sodium: 141 mEq/L (ref 135–145)

## 2011-06-04 LAB — HEMOGLOBIN A1C: Hgb A1c MFr Bld: 7.2 % — ABNORMAL HIGH (ref 4.6–6.5)

## 2011-06-04 NOTE — Telephone Encounter (Signed)
Patients wife [name] called and left voice message stating patient has been prescribed Tribenzor and he can no longer afford the medication. His copay is not  $168. She would like to know if the patient could stop by the office for samples.

## 2011-06-04 NOTE — Telephone Encounter (Signed)
Call placed to patient at [phone], he was informed samples of Tribenzor would be left at front desk for patient pick up. Patient has verbalized understanding and agrees that he will pick up samples at front desk.

## 2011-06-04 NOTE — Telephone Encounter (Signed)
Of course!

## 2011-06-06 LAB — MICROALBUMIN / CREATININE URINE RATIO
Creatinine,U: 130.8 mg/dL
Microalb Creat Ratio: 0.5 mg/g (ref 0.0–30.0)
Microalb, Ur: 0.6 mg/dL (ref 0.0–1.9)

## 2011-06-08 ENCOUNTER — Encounter: Payer: Self-pay | Admitting: Internal Medicine

## 2011-06-08 ENCOUNTER — Ambulatory Visit (INDEPENDENT_AMBULATORY_CARE_PROVIDER_SITE_OTHER): Payer: Self-pay | Admitting: Internal Medicine

## 2011-06-08 DIAGNOSIS — I10 Essential (primary) hypertension: Secondary | ICD-10-CM

## 2011-06-08 MED ORDER — LOSARTAN POTASSIUM 100 MG PO TABS: 100.0000 mg | ORAL_TABLET | Freq: Every day | ORAL | Status: DC

## 2011-06-08 MED ORDER — HYDROCHLOROTHIAZIDE 12.5 MG PO TABS: 12.5000 mg | ORAL_TABLET | Freq: Every day | ORAL | Status: DC

## 2011-06-08 MED ORDER — AMLODIPINE BESYLATE 5 MG PO TABS: 5.0000 mg | ORAL_TABLET | Freq: Every day | ORAL | Status: DC

## 2011-06-08 NOTE — Patient Instructions (Signed)
Please schedule chem7, a1c (250.0) and lipid/lft (272.4) prior to next visit 

## 2011-06-10 NOTE — Assessment & Plan Note (Signed)
Change tribenzor to hctz, amlodipine and losartan for cost consideration. Maintain outpt bp log.

## 2011-06-10 NOTE — Progress Notes (Signed)
  Subjective:    Patient ID: [name], male    DOB: [birth year], 65 y.o.   MRN: [id number]  HPI Pt presents to clinic for evaluation of multiple medical problems. Tolerating tribenzor but does not currently have insurance and current price is over $100 a month. BP reviewed under good control. FSBS range 116-124 without hypoglycemia. No active complaints.  Reviewed pmh, medications and allergies.    Review of Systems     Objective:   Physical Exam  Nursing note and vitals reviewed. Constitutional: He appears well-developed and well-nourished. No distress.  HENT:  Head: Normocephalic and atraumatic.  Eyes: Conjunctivae are normal.  Neurological: He is alert.  Skin: He is not diaphoretic.  Psychiatric: He has a normal mood and affect.          Assessment & Plan:

## 2011-06-26 ENCOUNTER — Other Ambulatory Visit: Payer: Self-pay | Admitting: *Deleted

## 2011-06-26 MED ORDER — AMLODIPINE BESYLATE 5 MG PO TABS: 5.0000 mg | ORAL_TABLET | Freq: Every day | ORAL | Status: DC

## 2011-06-26 MED ORDER — LOSARTAN POTASSIUM 100 MG PO TABS: 100.0000 mg | ORAL_TABLET | Freq: Every day | ORAL | Status: DC

## 2011-06-26 NOTE — Telephone Encounter (Signed)
Rx signed and mailed to patient's address on file.

## 2011-06-26 NOTE — Telephone Encounter (Signed)
Patients wife called and left voice message requesting printed rx's  For Amlodipine and Losartan for mail order. She has requested the Rx's be mailed to address on file.  Call placed to [facility] pharmacy at [phone], spoke with the pharmacist, she stated there were no current Rx's on file for the requested medication. Rx's have been printed and forwarded to provider for signature.

## 2011-08-13 ENCOUNTER — Telehealth: Payer: Self-pay | Admitting: *Deleted

## 2011-08-13 ENCOUNTER — Ambulatory Visit: Payer: Self-pay | Admitting: Internal Medicine

## 2011-08-13 NOTE — Telephone Encounter (Signed)
Patient wife called and left voice message stating patient is unable to pick up Tri Benzor from the pharmacy. She would like to know if the patient could stop by and pick up samples if available.

## 2011-08-14 NOTE — Telephone Encounter (Signed)
Call placed to patient at [phone], patients wife [name] was informed Tri Benzor was changed to generic alternatives. She has verbalized understanding. She stated they were not sure what those medications were for. She will have patient pick up Rx from pharmacy.

## 2011-08-29 ENCOUNTER — Telehealth: Payer: Self-pay | Admitting: Family Medicine

## 2011-08-29 NOTE — Telephone Encounter (Signed)
Arline Asp,  Per the new [facility] lab policy, we cannot put the patients on the lab schedule there. I have taken this patient off, but I still need the lab orders put in the system for [facility]. The patient is coming in 10/31to [facility]. The order is for: CHEM 7/A1C/LIPID panel, and LFT. Dx codes: 250.00 & 272.4. Thanks!

## 2011-08-30 ENCOUNTER — Other Ambulatory Visit: Payer: Self-pay | Admitting: Internal Medicine

## 2011-08-30 DIAGNOSIS — E785 Hyperlipidemia, unspecified: Secondary | ICD-10-CM

## 2011-08-30 DIAGNOSIS — E119 Type 2 diabetes mellitus without complications: Secondary | ICD-10-CM

## 2011-08-30 NOTE — Telephone Encounter (Signed)
This is a [facility] pt now.  Forwarded message to Glendell Docker, CMA

## 2011-09-05 ENCOUNTER — Other Ambulatory Visit: Payer: Self-pay

## 2011-09-06 ENCOUNTER — Other Ambulatory Visit (INDEPENDENT_AMBULATORY_CARE_PROVIDER_SITE_OTHER): Payer: Self-pay

## 2011-09-06 ENCOUNTER — Other Ambulatory Visit: Payer: Self-pay | Admitting: Internal Medicine

## 2011-09-06 DIAGNOSIS — E785 Hyperlipidemia, unspecified: Secondary | ICD-10-CM

## 2011-09-06 DIAGNOSIS — E119 Type 2 diabetes mellitus without complications: Secondary | ICD-10-CM

## 2011-09-06 LAB — HEPATIC FUNCTION PANEL
ALT: 25 U/L (ref 0–53)
AST: 23 U/L (ref 0–37)
Albumin: 4.1 g/dL (ref 3.5–5.2)
Alkaline Phosphatase: 71 U/L (ref 39–117)
Bilirubin, Direct: 0 mg/dL (ref 0.0–0.3)
Total Bilirubin: 0.6 mg/dL (ref 0.3–1.2)
Total Protein: 7.4 g/dL (ref 6.0–8.3)

## 2011-09-06 LAB — LIPID PANEL
Cholesterol: 166 mg/dL (ref 0–200)
HDL: 39.5 mg/dL (ref >39.00)
Total CHOL/HDL Ratio: 4
Triglycerides: 238 mg/dL — ABNORMAL HIGH (ref 0.0–149.0)
VLDL: 47.6 mg/dL — ABNORMAL HIGH (ref 0.0–40.0)

## 2011-09-06 LAB — BASIC METABOLIC PANEL
BUN: 17 mg/dL (ref 6–23)
CO2: 30 mEq/L (ref 19–32)
Calcium: 9.5 mg/dL (ref 8.4–10.5)
Chloride: 100 mEq/L (ref 96–112)
Creatinine, Ser: 0.7 mg/dL (ref 0.4–1.5)
GFR: 118.69 mL/min (ref >60.00)
Glucose, Bld: 159 mg/dL — ABNORMAL HIGH (ref 70–99)
Potassium: 4.4 mEq/L (ref 3.5–5.1)
Sodium: 139 mEq/L (ref 135–145)

## 2011-09-06 LAB — LDL CHOLESTEROL, DIRECT: Direct LDL: 111.3 mg/dL

## 2011-09-06 LAB — HEMOGLOBIN A1C: Hgb A1c MFr Bld: 7.3 % — ABNORMAL HIGH (ref 4.6–6.5)

## 2011-09-13 ENCOUNTER — Ambulatory Visit (INDEPENDENT_AMBULATORY_CARE_PROVIDER_SITE_OTHER): Payer: Self-pay | Admitting: Internal Medicine

## 2011-09-13 ENCOUNTER — Encounter: Payer: Self-pay | Admitting: Internal Medicine

## 2011-09-13 DIAGNOSIS — IMO0001 Reserved for inherently not codable concepts without codable children: Secondary | ICD-10-CM

## 2011-09-13 DIAGNOSIS — M791 Myalgia, unspecified site: Secondary | ICD-10-CM

## 2011-09-13 DIAGNOSIS — E119 Type 2 diabetes mellitus without complications: Secondary | ICD-10-CM

## 2011-09-13 DIAGNOSIS — E785 Hyperlipidemia, unspecified: Secondary | ICD-10-CM

## 2011-09-13 MED ORDER — CYCLOBENZAPRINE HCL 5 MG PO TABS: ORAL_TABLET | ORAL | Status: AC

## 2011-09-13 NOTE — Progress Notes (Signed)
  Subjective:    Patient ID: [name], male    DOB: 1946-03-30, 65 y.o.   MRN: [id number]  HPI Pt presents to clinic for followup of multiple medical problems. Chol reviewed as elevated despite zocor daily. Notes right trapezius muscle pain worse at night. No injury/trauma. No radicular pain or parethesia. A1c remains elevated. Higher dosages of metformin cause diarrhea. No other complaints.  Past Medical History  Diagnosis Date  . Hypertension   . Stroke   . Arthritis   . Hyperlipidemia   . History of nephrolithiasis   . PVD (peripheral vascular disease)     right carotid artery  . History of AAA (abdominal aortic aneurysm) repair 2008    3 cm  . Bulging disc 1984    back  . Diabetes mellitus, type 2    Past Surgical History  Procedure Date  . Carotid endarterectomy 2006    right carotid artery    reports that he has quit smoking. He does not have any smokeless tobacco history on file. He reports that he drinks alcohol. His drug history not on file. family history includes Cancer in his father; Diabetes in his mother; and Stroke in his father. No Known Allergies   Review of Systems see hpi     Objective:   Physical Exam  Physical Exam  Nursing note and vitals reviewed. Constitutional: Appears well-developed and well-nourished. No distress.  HENT:  Head: Normocephalic and atraumatic.  Right Ear: External ear normal.  Left Ear: External ear normal.  Eyes: Conjunctivae are normal. No scleral icterus.  Neck: Neck supple. Carotid bruit is not present.  Cardiovascular: Normal rate, regular rhythm and normal heart sounds.  Exam reveals no gallop and no friction rub.   No murmur heard. Pulmonary/Chest: Effort normal and breath sounds normal. No respiratory distress. He has no wheezes. no rales.  Lymphadenopathy:    He has no cervical adenopathy.  Neurological:Alert.  Skin: Skin is warm and dry. Not diaphoretic.  Psychiatric: Has a normal mood and affect.          Assessment & Plan:

## 2011-09-13 NOTE — Patient Instructions (Signed)
Please schedule chem7, a1c, urine microalbumin 250.0 and lipid 272.4 prior to next visit 

## 2011-09-16 DIAGNOSIS — M791 Myalgia, unspecified site: Secondary | ICD-10-CM | POA: Insufficient documentation

## 2011-09-16 NOTE — Assessment & Plan Note (Signed)
Recommend change to lipitor. Pt defers. Low fat diet and exercise

## 2011-09-16 NOTE — Assessment & Plan Note (Signed)
suboptimal control. Focus on diabetic diet, exercise and wt loss. If a1c remains suboptimal consider additional medication.

## 2011-09-16 NOTE — Assessment & Plan Note (Signed)
Attempt flexeril qhs prn.

## 2011-12-11 ENCOUNTER — Ambulatory Visit (INDEPENDENT_AMBULATORY_CARE_PROVIDER_SITE_OTHER): Payer: Self-pay | Admitting: Family Medicine

## 2011-12-11 DIAGNOSIS — E119 Type 2 diabetes mellitus without complications: Secondary | ICD-10-CM

## 2011-12-11 DIAGNOSIS — B9789 Other viral agents as the cause of diseases classified elsewhere: Secondary | ICD-10-CM

## 2011-12-11 DIAGNOSIS — J329 Chronic sinusitis, unspecified: Secondary | ICD-10-CM

## 2011-12-11 DIAGNOSIS — B349 Viral infection, unspecified: Secondary | ICD-10-CM

## 2011-12-11 LAB — POCT CBC
Granulocyte percent: 64.1 %G (ref 37–80)
HCT, POC: 43.8 % (ref 43.5–53.7)
Hemoglobin: 14.3 g/dL (ref 14.1–18.1)
Lymph, poc: 3.6 — AB (ref 0.6–3.4)
MCH, POC: 31.2 pg (ref 27–31.2)
MCHC: 32.6 g/dL (ref 31.8–35.4)
MCV: 95.6 fL (ref 80–97)
MID (cbc): 0.8 (ref 0–0.9)
MPV: 8.9 fL (ref 0–99.8)
POC Granulocyte: 7.9 — AB (ref 2–6.9)
POC LYMPH PERCENT: 29.5 %L (ref 10–50)
POC MID %: 6.4 %M (ref 0–12)
Platelet Count, POC: 302 10*3/uL (ref 142–424)
RBC: 4.58 M/uL — AB (ref 4.69–6.13)
RDW, POC: 13.7 %
WBC: 12.3 10*3/uL — AB (ref 4.6–10.2)

## 2011-12-11 LAB — GLUCOSE, POCT (MANUAL RESULT ENTRY): POC Glucose: 125

## 2011-12-11 MED ORDER — AZITHROMYCIN 250 MG PO TABS: ORAL_TABLET | ORAL | Status: AC

## 2011-12-11 MED ORDER — BUDESONIDE-FORMOTEROL FUMARATE 160-4.5 MCG/ACT IN AERO: 2.0000 | INHALATION_SPRAY | Freq: Two times a day (BID) | RESPIRATORY_TRACT | Status: DC

## 2011-12-11 MED ORDER — HYDROCODONE-HOMATROPINE 5-1.5 MG/5ML PO SYRP: 5.0000 mL | ORAL_SOLUTION | Freq: Three times a day (TID) | ORAL | Status: DC | PRN

## 2011-12-11 NOTE — Progress Notes (Signed)
  Subjective:    Patient ID: [name], male    DOB: 08-04-1946, 66 y.o.   MRN: [id number]  HPI  Facial congestion, cough, arthralgas since yesterday.  However these symptoms have been recurrent for the past month.  No weight loss or night sweats. Malaise Good appetite  Tactile fever when congested.  Does not check BS regularly  No joint swelling or deformity   Review of Systems C/ oneck soreness when pt turns head to the left.  Flaired up Monday  No radicular sxms    Objective:   Physical Exam  HENT:  Nose: Rhinorrhea: yellow.  Mouth/Throat: Posterior oropharyngeal erythema: clear pnd   Cardiovascular: Normal rate and regular rhythm.   Pulmonary/Chest: Effort normal and breath sounds normal.  Musculoskeletal: No joint deformity or synovitis        Assessment & Plan:   1. Viral disease  POCT CBC  2. DM (diabetes mellitus)  POCT glucose (manual entry)  3. Sinusitis      See meds on AVS Monitor BS. Call with follow up in 72 hours.

## 2011-12-13 ENCOUNTER — Telehealth: Payer: Self-pay | Admitting: Family Medicine

## 2011-12-13 NOTE — Telephone Encounter (Signed)
Called pt and spoke with wife. He is doing much better and wants to thank Dr. Hal Hope

## 2011-12-14 ENCOUNTER — Ambulatory Visit: Payer: Self-pay | Admitting: Internal Medicine

## 2011-12-18 ENCOUNTER — Other Ambulatory Visit: Payer: Self-pay | Admitting: Internal Medicine

## 2011-12-18 DIAGNOSIS — E119 Type 2 diabetes mellitus without complications: Secondary | ICD-10-CM

## 2011-12-18 DIAGNOSIS — E785 Hyperlipidemia, unspecified: Secondary | ICD-10-CM

## 2011-12-21 ENCOUNTER — Encounter: Payer: Self-pay | Admitting: Internal Medicine

## 2011-12-21 ENCOUNTER — Ambulatory Visit (INDEPENDENT_AMBULATORY_CARE_PROVIDER_SITE_OTHER): Payer: Self-pay | Admitting: Internal Medicine

## 2011-12-21 DIAGNOSIS — E119 Type 2 diabetes mellitus without complications: Secondary | ICD-10-CM

## 2011-12-21 DIAGNOSIS — E785 Hyperlipidemia, unspecified: Secondary | ICD-10-CM

## 2011-12-23 NOTE — Assessment & Plan Note (Signed)
Obtain chem7, a1c. Off victoza and can't increase metformin

## 2011-12-23 NOTE — Progress Notes (Signed)
  Subjective:    Patient ID: [name], male    DOB: 1946-04-17, 66 y.o.   MRN: [id number]  HPI Pt presents to clinic for followup of multiple medical problems. Recent URI s/p zpak from UC. Feels much improved. Has slight residual cough-no f/c. Stopped victoza out of safety concerns. fsbs 127-130 without hypoglycemia. Can't tolerate higher metformin dosing due to diarrhea. No other complaints.   Past Medical History  Diagnosis Date  . Hypertension   . Stroke   . Arthritis   . Hyperlipidemia   . History of nephrolithiasis   . PVD (peripheral vascular disease)     right carotid artery  . History of AAA (abdominal aortic aneurysm) repair 2008    3 cm  . Bulging disc 1984    back  . Diabetes mellitus, type 2    Past Surgical History  Procedure Date  . Carotid endarterectomy 2006    right carotid artery    reports that he has quit smoking. He has never used smokeless tobacco. He reports that he drinks alcohol. He reports that he does not use illicit drugs. family history includes Cancer in his father; Diabetes in his mother; and Stroke in his father. No Known Allergies   Review of Systems see hpi     Objective:   Physical Exam  Physical Exam  Nursing note and vitals reviewed. Constitutional: Appears well-developed and well-nourished. No distress.  HENT:  Head: Normocephalic and atraumatic.  Right Ear: External ear normal.  Left Ear: External ear normal.  Eyes: Conjunctivae are normal. No scleral icterus.  Neck: Neck supple. Carotid bruit is not present.  Cardiovascular: Normal rate, regular rhythm and normal heart sounds.  Exam reveals no gallop and no friction rub.   No murmur heard. Pulmonary/Chest: Effort normal and breath sounds normal. No respiratory distress. He has no wheezes. no rales.  Lymphadenopathy:    He has no cervical adenopathy.  Neurological:Alert.  Skin: Skin is warm and dry. Not diaphoretic.  Psychiatric: Has a normal mood and affect.          Assessment & Plan:

## 2011-12-23 NOTE — Assessment & Plan Note (Signed)
Obtain lipid/lft. 

## 2011-12-24 ENCOUNTER — Other Ambulatory Visit (INDEPENDENT_AMBULATORY_CARE_PROVIDER_SITE_OTHER): Payer: Self-pay

## 2011-12-24 DIAGNOSIS — E119 Type 2 diabetes mellitus without complications: Secondary | ICD-10-CM

## 2011-12-24 DIAGNOSIS — E785 Hyperlipidemia, unspecified: Secondary | ICD-10-CM

## 2011-12-24 LAB — BASIC METABOLIC PANEL
BUN: 16 mg/dL (ref 6–23)
CO2: 30 mEq/L (ref 19–32)
Calcium: 9.3 mg/dL (ref 8.4–10.5)
Chloride: 102 mEq/L (ref 96–112)
Creatinine, Ser: 0.8 mg/dL (ref 0.4–1.5)
GFR: 103.32 mL/min (ref >60.00)
Glucose, Bld: 211 mg/dL — ABNORMAL HIGH (ref 70–99)
Potassium: 4.1 mEq/L (ref 3.5–5.1)
Sodium: 140 mEq/L (ref 135–145)

## 2011-12-24 LAB — LIPID PANEL
Cholesterol: 151 mg/dL (ref 0–200)
HDL: 37.2 mg/dL — ABNORMAL LOW (ref >39.00)
LDL Cholesterol: 94 mg/dL (ref 0–99)
Total CHOL/HDL Ratio: 4
Triglycerides: 101 mg/dL (ref 0.0–149.0)
VLDL: 20.2 mg/dL (ref 0.0–40.0)

## 2011-12-24 LAB — HEMOGLOBIN A1C: Hgb A1c MFr Bld: 8.2 % — ABNORMAL HIGH (ref 4.6–6.5)

## 2011-12-28 ENCOUNTER — Other Ambulatory Visit: Payer: Self-pay | Admitting: Internal Medicine

## 2011-12-28 MED ORDER — SAXAGLIPTIN HCL 5 MG PO TABS: 5.0000 mg | ORAL_TABLET | Freq: Every day | ORAL | Status: DC

## 2011-12-31 ENCOUNTER — Telehealth: Payer: Self-pay | Admitting: *Deleted

## 2011-12-31 MED ORDER — SAXAGLIPTIN HCL 5 MG PO TABS: 5.0000 mg | ORAL_TABLET | Freq: Every day | ORAL | Status: DC

## 2011-12-31 NOTE — Telephone Encounter (Signed)
Patients wife called and left voice message requesting a 90 day supply on Onglyza in order to use discount card. Rx updated to reflect changes. Call placed to patient at [phone], he was informed of update to pharmacy.

## 2012-01-02 ENCOUNTER — Telehealth: Payer: Self-pay | Admitting: *Deleted

## 2012-01-02 MED ORDER — GLIMEPIRIDE 2 MG PO TABS: 2.0000 mg | ORAL_TABLET | Freq: Two times a day (BID) | ORAL | Status: DC

## 2012-01-02 NOTE — Telephone Encounter (Signed)
Patient called and left voice message stating he is having problems with getting the Onglyza filled. His message states that he is not able to use the discount card, because he does not have insurance. Due the difficulties with obtaining this medication. A Rx for Amaryl 2 mg twice a day per Dr Rodena Medin instructions in lab note will be sent in place of Onglyza to pharmacy.  Call placed to patient at [phone], He was informed of medication change to pharmacy.

## 2012-02-25 ENCOUNTER — Other Ambulatory Visit: Payer: Self-pay | Admitting: *Deleted

## 2012-02-25 MED ORDER — SIMVASTATIN 40 MG PO TABS: 20.0000 mg | ORAL_TABLET | Freq: Every evening | ORAL | Status: DC

## 2012-02-25 MED ORDER — METFORMIN HCL 500 MG PO TABS: ORAL_TABLET | ORAL | Status: DC

## 2012-02-25 NOTE — Telephone Encounter (Signed)
Form received via inbox  For refills to [facility] for Simvastatin 40 mg and Metformin HCl 500. Rx refill faxed to pharmacy.

## 2012-03-12 ENCOUNTER — Telehealth: Payer: Self-pay | Admitting: Internal Medicine

## 2012-03-12 MED ORDER — HYDROCHLOROTHIAZIDE 12.5 MG PO TABS: 12.5000 mg | ORAL_TABLET | Freq: Every day | ORAL | Status: DC

## 2012-03-12 NOTE — Telephone Encounter (Signed)
Refill sent to pharmacy.   

## 2012-03-12 NOTE — Telephone Encounter (Signed)
Refill- hydrochlorot 12.5mg  cap. Take one capsule by mouth every day. Qty 30 last fill 3.27.13

## 2012-04-01 ENCOUNTER — Telehealth: Payer: Self-pay | Admitting: Internal Medicine

## 2012-04-01 NOTE — Telephone Encounter (Signed)
Refill already sent on 03/12/12, #30 x 3 refills. Left message on pharmacy voicemail to check refills on file or call if questions.

## 2012-04-18 ENCOUNTER — Other Ambulatory Visit: Payer: Self-pay | Admitting: Internal Medicine

## 2012-04-18 ENCOUNTER — Other Ambulatory Visit (INDEPENDENT_AMBULATORY_CARE_PROVIDER_SITE_OTHER): Payer: Self-pay

## 2012-04-18 DIAGNOSIS — E785 Hyperlipidemia, unspecified: Secondary | ICD-10-CM

## 2012-04-18 DIAGNOSIS — E119 Type 2 diabetes mellitus without complications: Secondary | ICD-10-CM

## 2012-04-21 ENCOUNTER — Telehealth: Payer: Self-pay | Admitting: Internal Medicine

## 2012-04-21 ENCOUNTER — Other Ambulatory Visit: Payer: Self-pay

## 2012-04-21 ENCOUNTER — Encounter: Payer: Self-pay | Admitting: Internal Medicine

## 2012-04-21 ENCOUNTER — Ambulatory Visit (INDEPENDENT_AMBULATORY_CARE_PROVIDER_SITE_OTHER): Payer: Self-pay | Admitting: Internal Medicine

## 2012-04-21 VITALS — BP 120/80 | HR 83 | Temp 98.0°F | Resp 18 | Ht 67.0 in | Wt 241.0 lb

## 2012-04-21 DIAGNOSIS — E785 Hyperlipidemia, unspecified: Secondary | ICD-10-CM

## 2012-04-21 DIAGNOSIS — E119 Type 2 diabetes mellitus without complications: Secondary | ICD-10-CM

## 2012-04-21 DIAGNOSIS — R040 Epistaxis: Secondary | ICD-10-CM

## 2012-04-21 LAB — BASIC METABOLIC PANEL
BUN: 15 mg/dL (ref 6–23)
CO2: 29 mEq/L (ref 19–32)
Calcium: 9 mg/dL (ref 8.4–10.5)
Chloride: 101 mEq/L (ref 96–112)
Creatinine, Ser: 0.9 mg/dL (ref 0.4–1.5)
GFR: 94.95 mL/min (ref >60.00)
Glucose, Bld: 146 mg/dL — ABNORMAL HIGH (ref 70–99)
Potassium: 4 mEq/L (ref 3.5–5.1)
Sodium: 140 mEq/L (ref 135–145)

## 2012-04-21 LAB — HEPATIC FUNCTION PANEL
ALT: 30 U/L (ref 0–53)
AST: 31 U/L (ref 0–37)
Albumin: 3.8 g/dL (ref 3.5–5.2)
Alkaline Phosphatase: 70 U/L (ref 39–117)
Bilirubin, Direct: 0 mg/dL (ref 0.0–0.3)
Total Bilirubin: 0.3 mg/dL (ref 0.3–1.2)
Total Protein: 7.4 g/dL (ref 6.0–8.3)

## 2012-04-21 LAB — LDL CHOLESTEROL, DIRECT: Direct LDL: 103.3 mg/dL

## 2012-04-21 LAB — LIPID PANEL
Cholesterol: 160 mg/dL (ref 0–200)
HDL: 36.5 mg/dL — ABNORMAL LOW (ref >39.00)
Total CHOL/HDL Ratio: 4
Triglycerides: 246 mg/dL — ABNORMAL HIGH (ref 0.0–149.0)
VLDL: 49.2 mg/dL — ABNORMAL HIGH (ref 0.0–40.0)

## 2012-04-21 LAB — HEMOGLOBIN A1C: Hgb A1c MFr Bld: 7.3 % — ABNORMAL HIGH (ref 4.6–6.5)

## 2012-04-21 MED ORDER — SIMVASTATIN 40 MG PO TABS: 20.0000 mg | ORAL_TABLET | Freq: Every evening | ORAL | Status: DC

## 2012-04-21 NOTE — Assessment & Plan Note (Signed)
Mucosal irritation noted. Use nasal saline spray daily. If does not resolve consider ENT consult for visualization.

## 2012-04-21 NOTE — Patient Instructions (Signed)
Please schedule fasting labs prior to next visit ([facility]) Chem7, a1c, urine microalbumin-250.00

## 2012-04-21 NOTE — Telephone Encounter (Signed)
Lab orders entered for September 2013. 

## 2012-04-21 NOTE — Assessment & Plan Note (Signed)
Near goal. Continue statin tx. Low fat diet and wt loss

## 2012-04-21 NOTE — Assessment & Plan Note (Signed)
Improved control. Avoid meal skipping. Focus on dietary modification, wt loss and any exercise that is tolerated.

## 2012-04-21 NOTE — Progress Notes (Signed)
  Subjective:    Patient ID: [name], male    DOB: 08/17/1946, 66 y.o.   MRN: [id number]  HPI Pt presents to clinic for followup of multiple medical problems. Notes intermittent right nose bleeds over the last few weeks. They are controllable. a1c improved. Cannot tolerate higher metformin doses and onglyza was too expensive. Tolerating amaryl without hypoglycemia. fsbs avg 130's. LDL near goal.  Past Medical History  Diagnosis Date  . Hypertension   . Stroke   . Arthritis   . Hyperlipidemia   . History of nephrolithiasis   . PVD (peripheral vascular disease)     right carotid artery  . History of AAA (abdominal aortic aneurysm) repair 2008    3 cm  . Bulging disc 1984    back  . Diabetes mellitus, type 2    Past Surgical History  Procedure Date  . Carotid endarterectomy 2006    right carotid artery    reports that he has quit smoking. He has never used smokeless tobacco. He reports that he drinks alcohol. He reports that he does not use illicit drugs. family history includes Cancer in his father; Diabetes in his mother; and Stroke in his father. No Known Allergies    Review of Systems see hpi     Objective:   Physical Exam  Physical Exam  Nursing note and vitals reviewed. Constitutional: Appears well-developed and well-nourished. No distress.  HENT: right nasal mucosa erythematous and irritated. No gross bleeding, mass or sore noted.  Head: Normocephalic and atraumatic.  Right Ear: External ear normal.  Left Ear: External ear normal.  Eyes: Conjunctivae are normal. No scleral icterus.  Neck: Neck supple. Carotid bruit is not present.  Cardiovascular: Normal rate, regular rhythm and normal heart sounds.  Exam reveals no gallop and no friction rub.   No murmur heard. Pulmonary/Chest: Effort normal and breath sounds normal. No respiratory distress. He has no wheezes. no rales.  Lymphadenopathy:    He has no cervical adenopathy.  Neurological:Alert.  Skin: Skin  is warm and dry. Not diaphoretic.  Psychiatric: Has a normal mood and affect.        Assessment & Plan:

## 2012-05-06 ENCOUNTER — Telehealth: Payer: Self-pay | Admitting: Family Medicine

## 2012-05-06 ENCOUNTER — Other Ambulatory Visit: Payer: Self-pay | Admitting: Internal Medicine

## 2012-05-06 NOTE — Telephone Encounter (Signed)
Pt left a voice message requesting 3 scripts, Amlodipine, Metformin & Clonidine and fax to [facility] # [phone]. ( 90 day supply )

## 2012-05-07 MED ORDER — METFORMIN HCL 500 MG PO TABS: ORAL_TABLET | ORAL | Status: DC

## 2012-05-07 MED ORDER — CLONIDINE HCL 0.2 MG PO TABS: 0.1000 mg | ORAL_TABLET | Freq: Two times a day (BID) | ORAL | Status: DC

## 2012-05-07 MED ORDER — AMLODIPINE BESYLATE 5 MG PO TABS: 5.0000 mg | ORAL_TABLET | Freq: Every day | ORAL | Status: DC

## 2012-05-07 NOTE — Telephone Encounter (Signed)
I printed all 3 scripts and faxed to the below number.

## 2012-05-16 NOTE — Telephone Encounter (Signed)
Left message on cell# apologizing for the delay in refilling Coreg. Asked pt to call office on Monday with verification of mail order pharmacy.

## 2012-05-19 NOTE — Telephone Encounter (Signed)
Refill sent to [facility] and pt's wife notified.

## 2012-07-13 ENCOUNTER — Other Ambulatory Visit: Payer: Self-pay | Admitting: Family

## 2012-08-25 ENCOUNTER — Ambulatory Visit: Payer: Self-pay | Admitting: Internal Medicine

## 2012-09-12 ENCOUNTER — Other Ambulatory Visit (INDEPENDENT_AMBULATORY_CARE_PROVIDER_SITE_OTHER): Payer: Self-pay

## 2012-09-12 DIAGNOSIS — E119 Type 2 diabetes mellitus without complications: Secondary | ICD-10-CM

## 2012-09-12 LAB — BASIC METABOLIC PANEL
BUN: 21 mg/dL (ref 6–23)
CO2: 27 mEq/L (ref 19–32)
Calcium: 9.3 mg/dL (ref 8.4–10.5)
Chloride: 102 mEq/L (ref 96–112)
Creatinine, Ser: 0.9 mg/dL (ref 0.4–1.5)
GFR: 94.84 mL/min (ref >60.00)
Glucose, Bld: 159 mg/dL — ABNORMAL HIGH (ref 70–99)
Potassium: 4.4 mEq/L (ref 3.5–5.1)
Sodium: 138 mEq/L (ref 135–145)

## 2012-09-12 LAB — MICROALBUMIN / CREATININE URINE RATIO
Creatinine,U: 71.3 mg/dL
Microalb Creat Ratio: 1.4 mg/g (ref 0.0–30.0)
Microalb, Ur: 1 mg/dL (ref 0.0–1.9)

## 2012-09-12 LAB — HEMOGLOBIN A1C: Hgb A1c MFr Bld: 7.1 % — ABNORMAL HIGH (ref 4.6–6.5)

## 2012-09-15 ENCOUNTER — Encounter: Payer: Self-pay | Admitting: Internal Medicine

## 2012-09-15 ENCOUNTER — Ambulatory Visit (INDEPENDENT_AMBULATORY_CARE_PROVIDER_SITE_OTHER): Payer: Medicare Other | Admitting: Internal Medicine

## 2012-09-15 VITALS — BP 142/82 | HR 93 | Temp 98.0°F | Resp 18 | Wt 250.0 lb

## 2012-09-15 DIAGNOSIS — R040 Epistaxis: Secondary | ICD-10-CM

## 2012-09-15 DIAGNOSIS — E1142 Type 2 diabetes mellitus with diabetic polyneuropathy: Secondary | ICD-10-CM

## 2012-09-15 DIAGNOSIS — J069 Acute upper respiratory infection, unspecified: Secondary | ICD-10-CM

## 2012-09-15 DIAGNOSIS — Z23 Encounter for immunization: Secondary | ICD-10-CM

## 2012-09-15 DIAGNOSIS — R21 Rash and other nonspecific skin eruption: Secondary | ICD-10-CM

## 2012-09-15 DIAGNOSIS — E1149 Type 2 diabetes mellitus with other diabetic neurological complication: Secondary | ICD-10-CM

## 2012-09-15 MED ORDER — HYDROCHLOROTHIAZIDE 12.5 MG PO CAPS: ORAL_CAPSULE | ORAL | Status: DC

## 2012-09-15 MED ORDER — DOXYCYCLINE HYCLATE 100 MG PO TABS: 100.0000 mg | ORAL_TABLET | Freq: Two times a day (BID) | ORAL | Status: AC

## 2012-09-15 MED ORDER — GLIMEPIRIDE 2 MG PO TABS: 2.0000 mg | ORAL_TABLET | Freq: Two times a day (BID) | ORAL | Status: DC

## 2012-09-15 MED ORDER — AMLODIPINE BESYLATE 5 MG PO TABS: 5.0000 mg | ORAL_TABLET | Freq: Every day | ORAL | Status: DC

## 2012-09-15 MED ORDER — SIMVASTATIN 40 MG PO TABS: 20.0000 mg | ORAL_TABLET | Freq: Every evening | ORAL | Status: DC

## 2012-09-15 MED ORDER — LOSARTAN POTASSIUM 100 MG PO TABS: 100.0000 mg | ORAL_TABLET | Freq: Every day | ORAL | Status: DC

## 2012-09-15 MED ORDER — TRIAMCINOLONE ACETONIDE 0.025 % EX LOTN: TOPICAL_LOTION | CUTANEOUS | Status: AC

## 2012-09-15 MED ORDER — CLONIDINE HCL 0.2 MG PO TABS: 0.1000 mg | ORAL_TABLET | Freq: Two times a day (BID) | ORAL | Status: DC

## 2012-09-15 NOTE — Assessment & Plan Note (Signed)
Improved mildly. Encouraged appropriate diabetic diet and weight loss.

## 2012-09-15 NOTE — Assessment & Plan Note (Signed)
Attempt triamcinolone lotion to affected area. Followup if no improvement or worsening.

## 2012-09-15 NOTE — Assessment & Plan Note (Signed)
Begin antibiotic therapy. Followup if no improvement or worsening. 

## 2012-09-15 NOTE — Addendum Note (Signed)
Addended by: Regis Bill on: 09/15/2012 05:15 PM   Modules accepted: Orders

## 2012-09-15 NOTE — Assessment & Plan Note (Signed)
Recurrent. Proceed with ENT consult

## 2012-09-15 NOTE — Patient Instructions (Signed)
Please schedule fasting labs prior to next visit Cbc, chem7, a1c, urine microalbumin-250.00 and lipid/lft-272.4 

## 2012-09-15 NOTE — Progress Notes (Signed)
  Subjective:    Patient ID: [name], male    DOB: Oct 04, 1946, 66 y.o.   MRN: [id number]  HPI Pt presents to clinic for followup of multiple medical problems. Notes six-day history of intermittent wheezing with cough and congestion. No fever chills or sick exposure. Continues to have recurrent episodes of epistaxis. No current gross active bleeding. Has persistent neuropathy of bilateral feet and A1c improved to 7.1. Admits to dietary indiscretion.  Past Medical History  Diagnosis Date  . Hypertension   . Stroke   . Arthritis   . Hyperlipidemia   . History of nephrolithiasis   . PVD (peripheral vascular disease)     right carotid artery  . History of AAA (abdominal aortic aneurysm) repair 2008    3 cm  . Bulging disc 1984    back  . Diabetes mellitus, type 2    Past Surgical History  Procedure Date  . Carotid endarterectomy 2006    right carotid artery    reports that he has quit smoking. He has never used smokeless tobacco. He reports that he drinks alcohol. He reports that he does not use illicit drugs. family history includes Cancer in his father; Diabetes in his mother; and Stroke in his father. No Known Allergies   Review of Systems see hpi    Objective:   Physical Exam  Physical Exam  Nursing note and vitals reviewed. Constitutional: Appears well-developed and well-nourished. No distress.  HENT:  Head: Normocephalic and atraumatic. Bilateral nares patent. No gross bleeding sore or mass appreciated  Right Ear: External ear normal.  Left Ear: External ear normal.  Eyes: Conjunctivae are normal. No scleral icterus.  Neck: Neck supple. Carotid bruit is not present.  Cardiovascular: Normal rate, regular rhythm and normal heart sounds.  Exam reveals no gallop and no friction rub.   No murmur heard. Pulmonary/Chest: Effort normal and breath sounds normal. No respiratory distress. He has no wheezes. no rales.  Lymphadenopathy:    He has no cervical adenopathy.    Neurological:Alert.  Skin: Skin is warm and dry. Not diaphoretic.  right lateral head within hairline approximately 5 cm area of dry scaling skin. No drainage.  Psychiatric: Has a normal mood and affect.       Assessment & Plan:

## 2012-09-18 NOTE — Progress Notes (Signed)
Verified rx was called in on 09/15/12 per pharmacist, Shawna Orleans.

## 2012-10-31 ENCOUNTER — Ambulatory Visit (INDEPENDENT_AMBULATORY_CARE_PROVIDER_SITE_OTHER): Payer: Medicare Other | Admitting: Family Medicine

## 2012-10-31 VITALS — BP 169/90 | HR 91 | Temp 98.2°F | Resp 16 | Ht 69.0 in | Wt 252.0 lb

## 2012-10-31 DIAGNOSIS — M549 Dorsalgia, unspecified: Secondary | ICD-10-CM

## 2012-10-31 DIAGNOSIS — M545 Low back pain, unspecified: Secondary | ICD-10-CM

## 2012-10-31 DIAGNOSIS — I10 Essential (primary) hypertension: Secondary | ICD-10-CM

## 2012-10-31 MED ORDER — CYCLOBENZAPRINE HCL 5 MG PO TABS: ORAL_TABLET | ORAL | Status: DC

## 2012-10-31 MED ORDER — TRAMADOL HCL 50 MG PO TABS: 50.0000 mg | ORAL_TABLET | Freq: Four times a day (QID) | ORAL | Status: DC | PRN

## 2012-10-31 NOTE — Progress Notes (Signed)
Subjective:    Patient ID: [name], male    DOB: 1946-04-13, 66 y.o.   MRN: [id number]  HPI [name] is a 66 y.o. male  Started about 2 days ago with R sided low back pain into buttocks, radiates down R leg toward calf. No weakness, but sore to walk.  NKI, but was playing on floor with great grandbaby few days prior.  No bowel or bladder incontinence, no saddle anesthesia, no lower extremity weakness. Hx of back surgery about 30 years ago. Scheduled for cataract surgery in 4 days.   Tx: ibuprofen otc this am - 800mg   - took edge off.   Hx of DM2 - home blood sugars 150-170's.   Hasn't taken BP meds yet this am.  BP usually under control. Some pain in back this am as well.     Review of Systems  Respiratory: Negative for chest tightness and shortness of breath.   Cardiovascular: Negative for chest pain.  Gastrointestinal: Negative for abdominal pain.  Genitourinary: Negative for difficulty urinating.  Musculoskeletal: Positive for myalgias and back pain.  Skin: Negative for color change and rash.  Neurological: Negative for weakness.       No le weakness. No bowel/bladder incontinence, no saddle anesthesia.   As above. No weakness, no bowel/bladder symptoms.     Objective:   Physical Exam  Constitutional: He appears well-developed and well-nourished. No distress.       overweight  HENT:  Head: Normocephalic and atraumatic.  Neck: Normal range of motion.  Cardiovascular: Normal rate, regular rhythm, normal heart sounds and intact distal pulses.   Pulmonary/Chest: Effort normal and breath sounds normal.  Abdominal: Soft. There is no tenderness.  Musculoskeletal: He exhibits tenderness.       Right hip: He exhibits normal range of motion.       Lumbar back: He exhibits tenderness and spasm. He exhibits normal range of motion and no bony tenderness.       Back:  Neurological: He is alert. He has normal strength. No sensory deficit. He displays no Babinski's sign  on the right side.  Reflex Scores:      Patellar reflexes are 2+ on the right side and 1+ on the left side.      Achilles reflexes are 1+ on the right side and 1+ on the left side.      Able to heel and toe walk without difficulty, negative slr bilat.  Skin: Skin is warm and dry.  Psychiatric: He has a normal mood and affect. His behavior is normal.      Assessment & Plan:  [name] is a 66 y.o. male 1. Low back pain  cyclobenzaprine (FLEXERIL) 5 MG tablet, traMADol (ULTRAM) 50 MG tablet  2. HTN (hypertension)      Suspect early sciatica, strain with likely degenerative change with prior surgery.  xr deferred, but rtc precautions discussed.  Trial of HEP, back care manual, tylenol otc or ultram, and flexeril if needed.   HTN - usually controlled.  Check home bp's on meds.   Patient Instructions  Start with either the flexeril or tramadol for your low back pain. Both of these can make you sleepy.  Heat and gentle range of motion - see the back care manual.  If not improving in next 5-7 days, or worse sooner - return to clinic or ER. If your blood pressure remains elevated, follow up with your primary care provider.  Return to the clinic or go  to the nearest emergency room if any of your symptoms worsen or new symptoms occur.  Back Pain, Adult Low back pain is very common. About 1 in 5 people have back pain.The cause of low back pain is rarely dangerous. The pain often gets better over time.About half of people with a sudden onset of back pain feel better in just 2 weeks. About 8 in 10 people feel better by 6 weeks.  CAUSES Some common causes of back pain include:  Strain of the muscles or ligaments supporting the spine.  Wear and tear (degeneration) of the spinal discs.  Arthritis.  Direct injury to the back. DIAGNOSIS Most of the time, the direct cause of low back pain is not known.However, back pain can be treated effectively even when the exact cause of the pain is  unknown.Answering your caregiver's questions about your overall health and symptoms is one of the most accurate ways to make sure the cause of your pain is not dangerous. If your caregiver needs more information, he or she may order lab work or imaging tests (X-rays or MRIs).However, even if imaging tests show changes in your back, this usually does not require surgery. HOME CARE INSTRUCTIONS For many people, back pain returns.Since low back pain is rarely dangerous, it is often a condition that people can learn to [name] their own.   Remain active. It is stressful on the back to sit or stand in one place. Do not sit, drive, or stand in one place for more than 30 minutes at a time. Take short walks on level surfaces as soon as pain allows.Try to increase the length of time you walk each day.  Do not stay in bed.Resting more than 1 or 2 days can delay your recovery.  Do not avoid exercise or work.Your body is made to move.It is not dangerous to be active, even though your back may hurt.Your back will likely heal faster if you return to being active before your pain is gone.  Pay attention to your body when you bend and lift. Many people have less discomfortwhen lifting if they bend their knees, keep the load close to their bodies,and avoid twisting. Often, the most comfortable positions are those that put less stress on your recovering back.  Find a comfortable position to sleep. Use a firm mattress and lie on your side with your knees slightly bent. If you lie on your back, put a pillow under your knees.  Only take over-the-counter or prescription medicines as directed by your caregiver. Over-the-counter medicines to reduce pain and inflammation are often the most helpful.Your caregiver may prescribe muscle relaxant drugs.These medicines help dull your pain so you can more quickly return to your normal activities and healthy exercise.  Put ice on the injured area.  Put ice in a  plastic bag.  Place a towel between your skin and the bag.  Leave the ice on for 15 to 20 minutes, 3 to 4 times a day for the first 2 to 3 days. After that, ice and heat may be alternated to reduce pain and spasms.  Ask your caregiver about trying back exercises and gentle massage. This may be of some benefit.  Avoid feeling anxious or stressed.Stress increases muscle tension and can worsen back pain.It is important to recognize when you are anxious or stressed and learn ways to manage it.Exercise is a great option. SEEK MEDICAL CARE IF:  You have pain that is not relieved with rest or medicine.  You have  pain that does not improve in 1 week.  You have new symptoms.  You are generally not feeling well. SEEK IMMEDIATE MEDICAL CARE IF:   You have pain that radiates from your back into your legs.  You develop new bowel or bladder control problems.  You have unusual weakness or numbness in your arms or legs.  You develop nausea or vomiting.  You develop abdominal pain.  You feel faint. Document Released: 10/22/2005 Document Revised: 04/22/2012 Document Reviewed: 03/12/2011 [facility] Patient Information 2013 [place], [place].

## 2012-10-31 NOTE — Patient Instructions (Signed)
Start with either the flexeril or tramadol for your low back pain. Both of these can make you sleepy.  Heat and gentle range of motion - see the back care manual.  If not improving in next 5-7 days, or worse sooner - return to clinic or ER. If your blood pressure remains elevated, follow up with your primary care provider.  Return to the clinic or go to the nearest emergency room if any of your symptoms worsen or new symptoms occur.  Back Pain, Adult Low back pain is very common. About 1 in 5 people have back pain.The cause of low back pain is rarely dangerous. The pain often gets better over time.About half of people with a sudden onset of back pain feel better in just 2 weeks. About 8 in 10 people feel better by 6 weeks.  CAUSES Some common causes of back pain include:  Strain of the muscles or ligaments supporting the spine.  Wear and tear (degeneration) of the spinal discs.  Arthritis.  Direct injury to the back. DIAGNOSIS Most of the time, the direct cause of low back pain is not known.However, back pain can be treated effectively even when the exact cause of the pain is unknown.Answering your caregiver's questions about your overall health and symptoms is one of the most accurate ways to make sure the cause of your pain is not dangerous. If your caregiver needs more information, he or she may order lab work or imaging tests (X-rays or MRIs).However, even if imaging tests show changes in your back, this usually does not require surgery. HOME CARE INSTRUCTIONS For many people, back pain returns.Since low back pain is rarely dangerous, it is often a condition that people can learn to [facility] their own.   Remain active. It is stressful on the back to sit or stand in one place. Do not sit, drive, or stand in one place for more than 30 minutes at a time. Take short walks on level surfaces as soon as pain allows.Try to increase the length of time you walk each day.  Do not stay in  bed.Resting more than 1 or 2 days can delay your recovery.  Do not avoid exercise or work.Your body is made to move.It is not dangerous to be active, even though your back may hurt.Your back will likely heal faster if you return to being active before your pain is gone.  Pay attention to your body when you bend and lift. Many people have less discomfortwhen lifting if they bend their knees, keep the load close to their bodies,and avoid twisting. Often, the most comfortable positions are those that put less stress on your recovering back.  Find a comfortable position to sleep. Use a firm mattress and lie on your side with your knees slightly bent. If you lie on your back, put a pillow under your knees.  Only take over-the-counter or prescription medicines as directed by your caregiver. Over-the-counter medicines to reduce pain and inflammation are often the most helpful.Your caregiver may prescribe muscle relaxant drugs.These medicines help dull your pain so you can more quickly return to your normal activities and healthy exercise.  Put ice on the injured area.  Put ice in a plastic bag.  Place a towel between your skin and the bag.  Leave the ice on for 15 to 20 minutes, 3 to 4 times a day for the first 2 to 3 days. After that, ice and heat may be alternated to reduce pain and spasms.  Ask  your caregiver about trying back exercises and gentle massage. This may be of some benefit.  Avoid feeling anxious or stressed.Stress increases muscle tension and can worsen back pain.It is important to recognize when you are anxious or stressed and learn ways to manage it.Exercise is a great option. SEEK MEDICAL CARE IF:  You have pain that is not relieved with rest or medicine.  You have pain that does not improve in 1 week.  You have new symptoms.  You are generally not feeling well. SEEK IMMEDIATE MEDICAL CARE IF:   You have pain that radiates from your back into your legs.  You  develop new bowel or bladder control problems.  You have unusual weakness or numbness in your arms or legs.  You develop nausea or vomiting.  You develop abdominal pain.  You feel faint. Document Released: 10/22/2005 Document Revised: 04/22/2012 Document Reviewed: 03/12/2011 [facility] Patient Information 2013 [place], [place].

## 2012-11-01 ENCOUNTER — Ambulatory Visit (HOSPITAL_COMMUNITY)
Admission: RE | Admit: 2012-11-01 | Discharge: 2012-11-01 | Disposition: A | Discharge status: Home / Self Care | Payer: Medicare Other | Source: Ambulatory Visit | Attending: Family Medicine | Admitting: Family Medicine

## 2012-11-01 ENCOUNTER — Ambulatory Visit (INDEPENDENT_AMBULATORY_CARE_PROVIDER_SITE_OTHER): Payer: Medicare Other | Admitting: Family Medicine

## 2012-11-01 ENCOUNTER — Ambulatory Visit: Payer: Medicare Other

## 2012-11-01 ENCOUNTER — Telehealth: Payer: Self-pay

## 2012-11-01 VITALS — BP 148/79 | HR 101 | Temp 98.9°F | Resp 16 | Ht 69.0 in | Wt 250.0 lb

## 2012-11-01 DIAGNOSIS — E119 Type 2 diabetes mellitus without complications: Secondary | ICD-10-CM

## 2012-11-01 DIAGNOSIS — M79609 Pain in unspecified limb: Secondary | ICD-10-CM | POA: Insufficient documentation

## 2012-11-01 DIAGNOSIS — M7989 Other specified soft tissue disorders: Secondary | ICD-10-CM | POA: Insufficient documentation

## 2012-11-01 DIAGNOSIS — M545 Low back pain, unspecified: Secondary | ICD-10-CM

## 2012-11-01 DIAGNOSIS — M79669 Pain in unspecified lower leg: Secondary | ICD-10-CM

## 2012-11-01 DIAGNOSIS — M5136 Other intervertebral disc degeneration, lumbar region: Secondary | ICD-10-CM

## 2012-11-01 DIAGNOSIS — M5430 Sciatica, unspecified side: Secondary | ICD-10-CM

## 2012-11-01 LAB — GLUCOSE, POCT (MANUAL RESULT ENTRY): POC Glucose: 145 mg/dl — AB (ref 70–99)

## 2012-11-01 MED ORDER — PREDNISONE 20 MG PO TABS: 40.0000 mg | ORAL_TABLET | Freq: Every day | ORAL | Status: DC

## 2012-11-01 MED ORDER — HYDROCODONE-ACETAMINOPHEN 5-325 MG PO TABS: 1.0000 | ORAL_TABLET | Freq: Four times a day (QID) | ORAL | Status: DC | PRN

## 2012-11-01 NOTE — Progress Notes (Signed)
VASCULAR LAB PRELIMINARY  PRELIMINARY  PRELIMINARY  PRELIMINARY  Right lower extremity venous Doppler completed.    Preliminary report:  There is no DVT or SVT noted in the right lower extremity.  Minahil Quinlivan, RVT 11/01/2012, 3:00 PM

## 2012-11-01 NOTE — Telephone Encounter (Signed)
Pt came in today

## 2012-11-01 NOTE — Telephone Encounter (Signed)
Patient wife is calling saying they were in to see dr Neva Seat yesteday and his back pain is no better would like a nurse to call when possible at [phone]

## 2012-11-01 NOTE — Progress Notes (Signed)
Subjective:    Patient ID: [name], male    DOB: Apr 26, 1946, 66 y.o.   MRN: [id number]  HPI [name] is a 66 y.o. male See ov yesterday - LBP with pain into R buttocks noted after playing on floor with grandkids.  Suspected early sciatica, strain with likely degenerative change with prior surgery.  xr deferred yesterday,  Trial of HEP, back care manual, tylenol otc or ultram, and flexeril if needed.   Here for follow up. Took ultram x 2, 4-5 of flexeril, no relief.  No worsening, just not better. Still with shooting pain down to foot.  Forgot to say yesterday that R leg swelling - noticed in foot and ankle past 2 days. No chest pain or dyspnea. No recent prolonged car travel or air travel, calf not swollen, but sore on outside part and swelling to ankle/foot. No prior knee or hip pain/surgery on R side.  (hx of L knee surgery).  Difficult to sleep de to pain and having to sit in recliner. No bowel or bladder incontinence, no saddle anesthesia, no new lower extremity weakness (Hx of CVA with R hemiparesis).   Hx of DM - home sugars in 170 range at times.   Review of Systems  Respiratory: Negative for chest tightness and shortness of breath.   Cardiovascular: Negative for chest pain.  Musculoskeletal: Positive for myalgias, back pain, joint swelling (r ankle/lower leg. ) and arthralgias.  Skin: Negative for rash.       Objective:   Physical Exam  Vitals reviewed. Constitutional: He is oriented to person, place, and time. He appears well-developed and well-nourished.  HENT:  Head: Normocephalic and atraumatic.  Neck: Normal range of motion.  Cardiovascular: Normal rate, regular rhythm, normal heart sounds and intact distal pulses.   Pulmonary/Chest: Effort normal and breath sounds normal.  Abdominal: Soft. There is no tenderness.  Musculoskeletal: He exhibits tenderness.       Lumbar back: He exhibits tenderness and spasm. He exhibits normal range of motion and no bony  tenderness.       Back:       Right lower leg: He exhibits swelling (edema lower anterior more than posterior calf. negative homans, calf circumference equal (41cm)  at 15cm below patella.  hx of chronic swelling in L leg fom prior surgery. ). He exhibits no bony tenderness.  Lymphadenopathy:       Right: No inguinal adenopathy present.  Neurological: He is alert and oriented to person, place, and time. He has normal strength. No sensory deficit. He displays no Babinski's sign on the right side. He displays no Babinski's sign on the left side.  Reflex Scores:      Patellar reflexes are 1+ on the right side and 1+ on the left side.      Achilles reflexes are 0 on the right side and 0 on the left side.      Difficulty obtaining achilles reflexes bilaterally.   Skin: Skin is warm and dry.  Psychiatric: He has a normal mood and affect. His behavior is normal.    [facility] reading (PRIMARY) by  Dr. Neva Seat: LS spine: DDD, spondylosis, with likely minimal degenerative listhesis of L3 on L4.  Results for orders placed in visit on 11/01/12  GLUCOSE, POCT (MANUAL RESULT ENTRY)      Component Value Range   POC Glucose 145 (*) 70 - 99 mg/dl      Assessment & Plan:  [name] is a 66 y.o. male  1. LBP (low back pain)  DG Lumbar Spine Complete  2. Diabetes  POCT glucose (manual entry)  3. Calf pain  Lower Extremity Venous Duplex Right   LBP - likely DDD with flair causing sciatica symptoms. No improvement with mm relaxant, and ultram.  Will try short course of prednisone - but cautioned due to underlying diabetes.  Check blood sugars 3 times per day, RTC if over 250. Change ultram to lortab - 1 to 2 every 6 hours as needed, caution with combining this and flexeril. Recheck next 2-3 days of not improving, sooner if worse.   R leg pain, with swelling at ankle. Likely sciatic cause of pain, but new swelling - will check doppler/U/s of RLE to r/o DVT.   Patient Instructions  Go to [facility]  room registration desk - let them know the vascular technologist is expecting you for a Right leg ultrasound. You do not need to be seen in the ER.  Stop tramadol, can take lortab every 6 hours if needed for pain  (you can take up to 2 of these every 6 hours if needed, but be careful combining this medicine with cyclobenzaprine as both can make you sedated and change your breathing).  Start the prednisone for the sciatica.  This medicine can increase your blood sugar, so make sure you are checking your blood sugar 3 times per day.  If over 250 - return to discuss medication changes.  Recheck in the next few days if not improving. Return to the clinic or go to the nearest emergency room if any of your symptoms worsen or new symptoms occur.    1250 addendum: call report for ultrasound - negative for DVT. Patient advised.  Plan as above for LBP/sciatica.

## 2012-11-01 NOTE — Patient Instructions (Signed)
Go to [facility] Emergency room registration desk - let them know the vascular technologist is expecting you for a Right leg ultrasound. You do not need to be seen in the ER.  Stop tramadol, can take lortab every 6 hours if needed for pain  (you can take up to 2 of these every 6 hours if needed, but be careful combining this medicine with cyclobenzaprine as both can make you sedated and change your breathing).  Start the prednisone for the sciatica.  This medicine can increase your blood sugar, so make sure you are checking your blood sugar 3 times per day.  If over 250 - return to discuss medication changes.  Recheck in the next few days if not improving. Return to the clinic or go to the nearest emergency room if any of your symptoms worsen or new symptoms occur.

## 2012-11-03 ENCOUNTER — Ambulatory Visit (INDEPENDENT_AMBULATORY_CARE_PROVIDER_SITE_OTHER): Payer: Medicare Other | Admitting: Family Medicine

## 2012-11-03 ENCOUNTER — Telehealth: Payer: Self-pay

## 2012-11-03 VITALS — BP 174/102 | HR 106 | Temp 98.5°F | Resp 20 | Ht 69.5 in | Wt 250.4 lb

## 2012-11-03 DIAGNOSIS — I714 Abdominal aortic aneurysm, without rupture, unspecified: Secondary | ICD-10-CM

## 2012-11-03 DIAGNOSIS — M79609 Pain in unspecified limb: Secondary | ICD-10-CM

## 2012-11-03 DIAGNOSIS — M545 Low back pain, unspecified: Secondary | ICD-10-CM

## 2012-11-03 DIAGNOSIS — E119 Type 2 diabetes mellitus without complications: Secondary | ICD-10-CM

## 2012-11-03 DIAGNOSIS — M79606 Pain in leg, unspecified: Secondary | ICD-10-CM

## 2012-11-03 MED ORDER — OXYCODONE-ACETAMINOPHEN 5-325 MG PO TABS: 2.0000 | ORAL_TABLET | Freq: Four times a day (QID) | ORAL | Status: DC | PRN
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
## 2012-11-03 NOTE — Telephone Encounter (Signed)
PATIENT'S WIFE (SHIRLEY) CALLED TO SAY HER HUSBAND SAW DR. Neva Seat ON Friday FOR BACK PAIN. SHE STATES HE IS NOT DOING MUCH BETTER AND WOULD LIKE HIM TO HAVE A SONOGRAM OF HIS (R)  LEG AND ANKLE AND A MRI OF HIS BACK. WHEN EVER HE TAKES 2 VICODIN IT HELPS THE PAIN, BUT AS SOON AS IT WEARS OFF THE PAIN RETURNS.  BEST PHONE [phone] (SHIRLEY Lorincz-WIFE)  MBC

## 2012-11-03 NOTE — Patient Instructions (Signed)
You can change to the percocet, but be very careful with dizziness and sedation with this medicine. Take lowest effective dose. You can take (2) of your glimepiride each morning while you are taking prednisone.  We will try to schedule MRI of back in the morning, and then in the next few weeks the abdominal ultrasound. Return to the clinic or go to the nearest emergency room if any of your symptoms worsen or new symptoms occur overnight.

## 2012-11-03 NOTE — Telephone Encounter (Signed)
Phone notes reviewed. Talked to patient's wife. He has been taking prednisone as prescribed.  No relief. Ankle is hurting him more now.  Foot is more swollen tonight - feels like ankle is more swollen. Asked about sonogram - discussed had ultrasound 2 days ago without DVT. Advised to rtc tonight for eval.

## 2012-11-03 NOTE — Telephone Encounter (Signed)
Dr Neva Seat, pt called back again later and spoke w/Kimber and said, he only slept about 5 hrs all weekend and "just needs some help". Pt reported that he had to take 2 tabs of Vicodin at a time in order to get any relief. Do you want to order the requested tests, and please advise on pain relief also.

## 2012-11-04 ENCOUNTER — Other Ambulatory Visit: Payer: Self-pay | Admitting: Radiology

## 2012-11-04 DIAGNOSIS — I714 Abdominal aortic aneurysm, without rupture: Secondary | ICD-10-CM

## 2012-11-05 ENCOUNTER — Telehealth: Payer: Self-pay

## 2012-11-05 NOTE — Telephone Encounter (Signed)
pts wife is calling saying that the pain meds for his back is doing no good please call shirley his wife back at [phone]

## 2012-11-05 NOTE — Telephone Encounter (Signed)
Patient is taking Percocet, states no relief with this, please advise. We are waiting for authorization for the MRI scan, it has been sent for further review, I should get notice tomorrow about whether or not this will need peer to peer.

## 2012-11-06 ENCOUNTER — Ambulatory Visit
Admission: RE | Admit: 2012-11-06 | Discharge: 2012-11-06 | Disposition: A | Discharge status: Home / Self Care | Payer: Medicare Other | Source: Ambulatory Visit | Attending: Family Medicine | Admitting: Family Medicine

## 2012-11-06 ENCOUNTER — Encounter (HOSPITAL_COMMUNITY): Payer: Self-pay | Admitting: Emergency Medicine

## 2012-11-06 ENCOUNTER — Emergency Department (HOSPITAL_COMMUNITY)
Admission: EM | Admit: 2012-11-06 | Discharge: 2012-11-06 | Disposition: A | Discharge status: Home / Self Care | Payer: Medicare Other | Attending: Emergency Medicine | Admitting: Emergency Medicine

## 2012-11-06 DIAGNOSIS — Z8739 Personal history of other diseases of the musculoskeletal system and connective tissue: Secondary | ICD-10-CM | POA: Insufficient documentation

## 2012-11-06 DIAGNOSIS — M545 Low back pain, unspecified: Secondary | ICD-10-CM

## 2012-11-06 DIAGNOSIS — Z87891 Personal history of nicotine dependence: Secondary | ICD-10-CM | POA: Insufficient documentation

## 2012-11-06 DIAGNOSIS — Z79899 Other long term (current) drug therapy: Secondary | ICD-10-CM | POA: Insufficient documentation

## 2012-11-06 DIAGNOSIS — M79606 Pain in leg, unspecified: Secondary | ICD-10-CM

## 2012-11-06 DIAGNOSIS — M5430 Sciatica, unspecified side: Secondary | ICD-10-CM | POA: Insufficient documentation

## 2012-11-06 DIAGNOSIS — Z8719 Personal history of other diseases of the digestive system: Secondary | ICD-10-CM | POA: Insufficient documentation

## 2012-11-06 DIAGNOSIS — Z7982 Long term (current) use of aspirin: Secondary | ICD-10-CM | POA: Insufficient documentation

## 2012-11-06 DIAGNOSIS — Z8679 Personal history of other diseases of the circulatory system: Secondary | ICD-10-CM | POA: Insufficient documentation

## 2012-11-06 DIAGNOSIS — I10 Essential (primary) hypertension: Secondary | ICD-10-CM | POA: Insufficient documentation

## 2012-11-06 DIAGNOSIS — E119 Type 2 diabetes mellitus without complications: Secondary | ICD-10-CM | POA: Insufficient documentation

## 2012-11-06 DIAGNOSIS — M5431 Sciatica, right side: Secondary | ICD-10-CM

## 2012-11-06 DIAGNOSIS — E785 Hyperlipidemia, unspecified: Secondary | ICD-10-CM | POA: Insufficient documentation

## 2012-11-06 MED ORDER — HYDROMORPHONE HCL PF 2 MG/ML IJ SOLN
2.0000 mg | Freq: Once | INTRAMUSCULAR | Status: AC
  Administered 2012-11-06: 2 mg via INTRAMUSCULAR
  Filled 2012-11-06: qty 1

## 2012-11-06 MED ORDER — HYDROMORPHONE HCL 4 MG PO TABS: 2.0000 mg | ORAL_TABLET | ORAL | Status: DC | PRN

## 2012-11-06 NOTE — ED Notes (Signed)
Pt ambulate to restroom

## 2012-11-06 NOTE — ED Notes (Signed)
Pt complaining of rightsided back pain, x 9 days, radiates down left leg, unable to sleep. Was seen at urgent care last weekend.

## 2012-11-06 NOTE — Telephone Encounter (Signed)
In further review of staff messages, I see he was seen at the emergency department this morning and started on Dilaudid by the care provider there.  I still think we need him to be seen by ortho on Friday 11/07/12, but we can call and check status on the Dilaudid for pain relief.

## 2012-11-06 NOTE — ED Provider Notes (Signed)
History     CSN: [id number]  Arrival date & time 11/06/12  4098   First MD Initiated Contact with Patient 11/06/12 [phone]      Chief Complaint  Patient presents with  . Back Pain    (Consider location/radiation/quality/duration/timing/severity/associated sxs/prior treatment) HPI 67 year old male presents to the emergency department with chief complaint of sciatic pain.  Patient has been seen and evaluated several times over the past 9 days.  Most recently he was seen by Dr. Fredna Park at [facility] urgent care.  He is scheduled for MRI tomorrow at [facility] imaging.. patient states that he was playing with his grandchild 9 days ago.  He awoke the next day with mild right-sided lower back and hip pain.  The pain has progressively worse and with shooting pain down the right leg.  He rates the pain at 10 out of 10.  He has been unable to sleep.  He has taken prednisone and was prescribed Percocet 5/325 2 every 6 hours.  Patient states that his pain returns within 2 hours of taking the medication. Denies weakness, loss of bowel/bladder function or saddle anesthesia. Denies neck stiffness, headache, rash.  Denies fever or recent procedures to back.  Denies fevers, chills, myalgias, arthralgias. Denies DOE, SOB, chest tightness or pressure, radiation to left arm, jaw or back, or diaphoresis. Denies dysuria, flank pain, suprapubic pain, frequency, urgency, or hematuria. Denies headaches, light headedness, weakness, visual disturbances. Denies abdominal pain, nausea, vomiting, diarrhea or constipation.   Past Medical History  Diagnosis Date  . Hypertension   . Stroke   . Arthritis   . Hyperlipidemia   . History of nephrolithiasis   . PVD (peripheral vascular disease)     right carotid artery  . History of AAA (abdominal aortic aneurysm) repair 2008    3 cm  . Bulging disc 1984    back  . Diabetes mellitus, type 2     Past Surgical History  Procedure Date  . Carotid endarterectomy 2006   right carotid artery    Family History  Problem Relation Age of Onset  . Diabetes Mother   . Cancer Father     lung  . Stroke Father     History  Substance Use Topics  . Smoking status: Former Games developer  . Smokeless tobacco: Never Used  . Alcohol Use: Yes     Comment: social      Review of Systems Ten systems reviewed and are negative for acute change, except as noted in the HPI.   Allergies  Review of patient's allergies indicates no known allergies.  Home Medications   Current Outpatient Rx  Name  Route  Sig  Dispense  Refill  . ACETAMINOPHEN 500 MG PO TABS   Oral   Take 1,500 mg by mouth 2 (two) times daily as needed. For pain         . AMLODIPINE BESYLATE 5 MG PO TABS   Oral   Take 5 mg by mouth daily.         [name] VITAMIN C 1000 MG PO TABS   Oral   Take 1,000 mg by mouth daily.         . ASPIRIN 81 MG PO TABS   Oral   Take 81 mg by mouth daily.          . BUDESONIDE-FORMOTEROL FUMARATE 160-4.5 MCG/ACT IN AERO   Inhalation   Inhale 2 puffs into the lungs 2 (two) times daily as needed. Shortness of breath         .  CALCIUM 600+D PLUS MINERALS 600-400 MG-UNIT PO TABS   Oral   Take 1 tablet by mouth daily.          [name] CARVEDILOL 25 MG PO TABS   Oral   Take 12.5 mg by mouth 2 (two) times daily with a meal.         . CLONIDINE HCL 0.2 MG PO TABS   Oral   Take 0.1 mg by mouth 2 (two) times daily.         . CYCLOBENZAPRINE HCL 5 MG PO TABS   Oral   Take 5 mg by mouth every 8 (eight) hours as needed.         . OMEGA-3 FATTY ACIDS 1000 MG PO CAPS   Oral   Take 1 g by mouth daily.          [name] GLIMEPIRIDE 2 MG PO TABS   Oral   Take 2 mg by mouth 2 (two) times daily.         [name] HYDROCHLOROTHIAZIDE 12.5 MG PO CAPS   Oral   Take 12.5 mg by mouth daily. TAKE ONE CAPSULE BY MOUTH EVERY DAY         . LOSARTAN POTASSIUM 100 MG PO TABS   Oral   Take 100 mg by mouth daily.         [name] METFORMIN HCL 500 MG PO TABS   Oral   Take  500-1,000 mg by mouth 2 (two) times daily with a meal. 1 tab with AM meal, and 2 tabs with evening meal         . ONE-DAILY MULTI VITAMINS PO TABS   Oral   Take 1 tablet by mouth daily.           . OXYCODONE-ACETAMINOPHEN 5-325 MG PO TABS   Oral   Take 2 tablets by mouth every 6 (six) hours as needed. For pain         . PREDNISONE 20 MG PO TABS   Oral   Take 20 mg by mouth 2 (two) times daily.         [name] SIMVASTATIN 40 MG PO TABS   Oral   Take 20 mg by mouth every evening.         [name] GLUCOSE BLOOD VI STRP      Use 1 strip as instructed          . INSULIN PEN NEEDLE 31G X 8 MM MISC   Does not apply   by Does not apply route. Use once daily as directed.          [name] FREESTYLE LANCETS MISC      Use as instructed once a day.            BP 127/77  Pulse 87  Temp 98.5 F (36.9 C) (Oral)  Resp 20  SpO2 93%  Physical Exam Physical Exam  Nursing note and vitals reviewed. Constitutional: He appears well-developed and well-nourished. No distress. Morbidly obese HENT:  Head: Normocephalic and atraumatic.  Eyes: Conjunctivae normal are normal. No scleral icterus.  Neck: Normal range of motion. Neck supple.  Cardiovascular: Normal rate, regular rhythm and normal heart sounds.   Pulmonary/Chest: Effort normal and breath sounds normal. No respiratory distress.  Abdominal: Soft. There is no tenderness.  Musculoskeletal: He exhibits no edema.  TTP LUmbar spine and hips. + straight leg test right side.  Neurological: He is alert. Ambulation limited due to pain. Using walker at home.  Unable  to stande fully erect. Skin: Skin is warm and dry. He is not diaphoretic.  Psychiatric: His behavior is normal.    ED Course  Procedures (including critical care time)  Labs Reviewed - No data to display No results found.   No diagnosis found.    MDM  Patient with known Sciatica.  He is being followed by Dr. Neva Seat at [facility].  I have given IM dilaudid.  Patient has sig.  Relief.  He is able to ambulate and no red flag sxs.  He is scheduled for MRI tomorrow morning. I will change the patient's pain medication.  Fredia Beets also messaged Dr. Neva Seat to let him know of this change.  Discussed reasons to seek immediate care. Patient expresses understanding and agrees with plan.  At this time there does not appear to be any evidence of an acute emergency medical condition and the patient appears stable for discharge with appropriate outpatient follow up.Diagnosis was discussed with patient who verbalizes understanding and is agreeable to discharge. Pt case discussed with Dr. Effie Shy who agrees with my plan.         Arthor Captain, PA-C 11/07/12 1008

## 2012-11-06 NOTE — Telephone Encounter (Signed)
Please call and clarify, as when I saw him acutely a few days ago after speaking with his wife on the phone, he indicated the back pain was getting a little better when I saw him in the office.  He still was having pain though, and pain down the side of his leg thought to be radicular in addition to possible gout flare in ankle.  How often is he taking the percocet?   If he does not feel like he is getting relief from the percocet, and we are not able to get an MRI, let's see if we can get him into any ortho tomorrow (Friday 11/07/12).  I will be in the office in the morning if clarification needed.

## 2012-11-07 ENCOUNTER — Telehealth: Payer: Self-pay

## 2012-11-07 ENCOUNTER — Ambulatory Visit
Admission: RE | Admit: 2012-11-07 | Discharge: 2012-11-07 | Disposition: A | Discharge status: Home / Self Care | Payer: Medicare Other | Source: Ambulatory Visit | Attending: Family Medicine | Admitting: Family Medicine

## 2012-11-07 ENCOUNTER — Other Ambulatory Visit: Payer: Self-pay | Admitting: Family Medicine

## 2012-11-07 DIAGNOSIS — M79606 Pain in leg, unspecified: Secondary | ICD-10-CM

## 2012-11-07 DIAGNOSIS — M545 Low back pain, unspecified: Secondary | ICD-10-CM

## 2012-11-07 DIAGNOSIS — M5126 Other intervertebral disc displacement, lumbar region: Secondary | ICD-10-CM

## 2012-11-07 DIAGNOSIS — I714 Abdominal aortic aneurysm, without rupture: Secondary | ICD-10-CM

## 2012-11-07 NOTE — ED Provider Notes (Signed)
Medical screening examination/treatment/procedure(s) were performed by non-physician practitioner and as supervising physician I was immediately available for consultation/collaboration.  Flint Melter, MD 11/07/12 1236

## 2012-11-07 NOTE — Telephone Encounter (Signed)
I spoke w/pt's wife. See notes under 11/06/11 phone message notes.

## 2012-11-07 NOTE — Telephone Encounter (Signed)
Reviewed  MRI - notable for moderate to large extruded disc fragment - posterior to L5 compressing R lateral aspect - of thecal sac and right L 5 nerve root. Spoke with Dr. Lovell Sheehan with neurosurgery - patient to be at his office for eval at 4pm.  If he is unable to see [name] today, we may need to admit him for pain control and inpatient consult.  Patient/wife advised.

## 2012-11-07 NOTE — Telephone Encounter (Signed)
Dr.Greene Pts wife [name] would like for you to call her asap regarding this pt,  Pt's wife did not want to give any other information. Best# [phone]

## 2012-11-07 NOTE — Telephone Encounter (Signed)
Wife reports that the pt states he is in more pain than ever this morning, and if Dr Neva Seat wants to put him in the hospital he is willing to go now. Wife states that pt is taking a whole tab of Dilaudid Q 4hrs, and after 2 hrs he starts asking if it is time for him to take another, and it is just not controlling the pain at all. Pt's wife reports that the MRI was done last night (report is now in EPIC).  Dr Neva Seat, could you please review MRI and then advise if you want [name] to try to get pt into ortho or do you want him to go to the hospital as you D/W pt at last OV?

## 2012-11-07 NOTE — Telephone Encounter (Signed)
Per Dr Paralee Cancel request, I called pt's wife and notified her that the MRI did show a extruding disc fragment that looks like it is pinching a nerve (along w/some less significant findings), and that we calling to consult w/a neurosurgeon to see if he can be seen today or if we need to send pt to the hospital. Advised wife that we will CB w/further instr's for the pt as soon as we see how to proceed.

## 2012-11-10 ENCOUNTER — Encounter (HOSPITAL_COMMUNITY): Payer: Self-pay | Admitting: *Deleted

## 2012-11-10 ENCOUNTER — Encounter (HOSPITAL_COMMUNITY): Payer: Self-pay | Admitting: General Practice

## 2012-11-10 ENCOUNTER — Encounter (HOSPITAL_COMMUNITY)
Admission: RE | Disposition: A | Discharge status: Home / Self Care | Payer: Self-pay | Source: Ambulatory Visit | Attending: Neurosurgery

## 2012-11-10 ENCOUNTER — Inpatient Hospital Stay (HOSPITAL_COMMUNITY): Payer: Medicare Other

## 2012-11-10 ENCOUNTER — Encounter (HOSPITAL_COMMUNITY): Payer: Self-pay | Admitting: Anesthesiology

## 2012-11-10 ENCOUNTER — Inpatient Hospital Stay (HOSPITAL_COMMUNITY)
Admission: RE | Admit: 2012-11-10 | Discharge: 2012-11-11 | DRG: 491 | Disposition: A | Discharge status: Home / Self Care | Payer: Medicare Other | Source: Ambulatory Visit | Attending: Neurosurgery | Admitting: Neurosurgery

## 2012-11-10 ENCOUNTER — Inpatient Hospital Stay (HOSPITAL_COMMUNITY): Payer: Medicare Other | Admitting: Anesthesiology

## 2012-11-10 DIAGNOSIS — Z8673 Personal history of transient ischemic attack (TIA), and cerebral infarction without residual deficits: Secondary | ICD-10-CM

## 2012-11-10 DIAGNOSIS — Z823 Family history of stroke: Secondary | ICD-10-CM

## 2012-11-10 DIAGNOSIS — E119 Type 2 diabetes mellitus without complications: Secondary | ICD-10-CM | POA: Diagnosis present

## 2012-11-10 DIAGNOSIS — E785 Hyperlipidemia, unspecified: Secondary | ICD-10-CM | POA: Diagnosis present

## 2012-11-10 DIAGNOSIS — Z87891 Personal history of nicotine dependence: Secondary | ICD-10-CM

## 2012-11-10 DIAGNOSIS — M5126 Other intervertebral disc displacement, lumbar region: Principal | ICD-10-CM

## 2012-11-10 DIAGNOSIS — Z79899 Other long term (current) drug therapy: Secondary | ICD-10-CM

## 2012-11-10 DIAGNOSIS — Z833 Family history of diabetes mellitus: Secondary | ICD-10-CM

## 2012-11-10 DIAGNOSIS — Z794 Long term (current) use of insulin: Secondary | ICD-10-CM

## 2012-11-10 DIAGNOSIS — Z7982 Long term (current) use of aspirin: Secondary | ICD-10-CM

## 2012-11-10 DIAGNOSIS — K219 Gastro-esophageal reflux disease without esophagitis: Secondary | ICD-10-CM | POA: Diagnosis present

## 2012-11-10 DIAGNOSIS — I10 Essential (primary) hypertension: Secondary | ICD-10-CM | POA: Diagnosis present

## 2012-11-10 HISTORY — DX: Unspecified asthma, uncomplicated: J45.909

## 2012-11-10 HISTORY — DX: Low back pain: M54.5

## 2012-11-10 HISTORY — DX: Gastro-esophageal reflux disease without esophagitis: K21.9

## 2012-11-10 HISTORY — PX: DECOMPRESSIVE LUMBAR LAMINECTOMY LEVEL 1: SHX5791

## 2012-11-10 HISTORY — DX: Cardiac arrhythmia, unspecified: I49.9

## 2012-11-10 HISTORY — DX: Pneumonia, unspecified organism: J18.9

## 2012-11-10 HISTORY — DX: Calculus of kidney: N20.0

## 2012-11-10 HISTORY — DX: Unspecified chronic bronchitis: J42

## 2012-11-10 HISTORY — DX: Shortness of breath: R06.02

## 2012-11-10 HISTORY — DX: Other forms of dyspnea: R06.09

## 2012-11-10 HISTORY — PX: LUMBAR LAMINECTOMY/DECOMPRESSION MICRODISCECTOMY: SHX5026

## 2012-11-10 HISTORY — DX: Abdominal aortic aneurysm, without rupture: I71.4

## 2012-11-10 HISTORY — DX: Low back pain, unspecified: M54.50

## 2012-11-10 HISTORY — DX: Other chronic pain: G89.29

## 2012-11-10 HISTORY — DX: Gout, unspecified: M10.9

## 2012-11-10 LAB — BASIC METABOLIC PANEL
BUN: 22 mg/dL (ref 6–23)
CO2: 32 mEq/L (ref 19–32)
Calcium: 9.8 mg/dL (ref 8.4–10.5)
Chloride: 92 mEq/L — ABNORMAL LOW (ref 96–112)
Creatinine, Ser: 0.89 mg/dL (ref 0.50–1.35)
GFR calc Af Amer: >90 mL/min (ref >90)
GFR calc non Af Amer: 88 mL/min — ABNORMAL LOW (ref >90)
Glucose, Bld: 204 mg/dL — ABNORMAL HIGH (ref 70–99)
Potassium: 4.6 mEq/L (ref 3.5–5.1)
Sodium: 134 mEq/L — ABNORMAL LOW (ref 135–145)

## 2012-11-10 LAB — GLUCOSE, CAPILLARY
Glucose-Capillary: 216 mg/dL — ABNORMAL HIGH (ref 70–99)
Glucose-Capillary: 224 mg/dL — ABNORMAL HIGH (ref 70–99)
Glucose-Capillary: 308 mg/dL — ABNORMAL HIGH (ref 70–99)
Glucose-Capillary: 282 mg/dL — ABNORMAL HIGH (ref 70–99)
Glucose-Capillary: 284 mg/dL — ABNORMAL HIGH (ref 70–99)

## 2012-11-10 LAB — CBC
HCT: 41.8 % (ref 39.0–52.0)
Hemoglobin: 13.8 g/dL (ref 13.0–17.0)
MCH: 31.9 pg (ref 26.0–34.0)
MCHC: 33 g/dL (ref 30.0–36.0)
MCV: 96.5 fL (ref 78.0–100.0)
Platelets: 247 10*3/uL (ref 150–400)
RBC: 4.33 MIL/uL (ref 4.22–5.81)
RDW: 13.7 % (ref 11.5–15.5)
WBC: 13.1 10*3/uL — ABNORMAL HIGH (ref 4.0–10.5)

## 2012-11-10 LAB — POCT I-STAT 4, (NA,K, GLUC, HGB,HCT)
Glucose, Bld: 205 mg/dL — ABNORMAL HIGH (ref 70–99)
HCT: 42 % (ref 39.0–52.0)
Hemoglobin: 14.3 g/dL (ref 13.0–17.0)
Potassium: 4.3 mEq/L (ref 3.5–5.1)
Sodium: 134 mEq/L — ABNORMAL LOW (ref 135–145)

## 2012-11-10 LAB — SURGICAL PCR SCREEN
MRSA, PCR: NEGATIVE
Staphylococcus aureus: NEGATIVE

## 2012-11-10 SURGERY — LUMBAR LAMINECTOMY/DECOMPRESSION MICRODISCECTOMY 1 LEVEL
Anesthesia: General | Site: Spine Lumbar | Laterality: Right | Wound class: Clean

## 2012-11-10 MED ORDER — BUDESONIDE-FORMOTEROL FUMARATE 160-4.5 MCG/ACT IN AERO
2.0000 | INHALATION_SPRAY | Freq: Two times a day (BID) | RESPIRATORY_TRACT | Status: DC
  Administered 2012-11-10: 2 via RESPIRATORY_TRACT
  Filled 2012-11-10: qty 6

## 2012-11-10 MED ORDER — FENTANYL CITRATE 0.05 MG/ML IJ SOLN
INTRAMUSCULAR | Status: DC | PRN
  Administered 2012-11-10: 150 ug via INTRAVENOUS

## 2012-11-10 MED ORDER — DEXTROSE 5 % IV SOLN
INTRAVENOUS | Status: DC | PRN
  Administered 2012-11-10: 08:00:00 via INTRAVENOUS

## 2012-11-10 MED ORDER — MORPHINE SULFATE 2 MG/ML IJ SOLN: 1.0000 mg | INTRAMUSCULAR | Status: DC | PRN

## 2012-11-10 MED ORDER — THROMBIN 5000 UNITS EX KIT
PACK | CUTANEOUS | Status: DC | PRN
  Administered 2012-11-10 (×2): 5000 [IU] via TOPICAL

## 2012-11-10 MED ORDER — METFORMIN HCL 500 MG PO TABS: 500.0000 mg | ORAL_TABLET | Freq: Two times a day (BID) | ORAL | Status: DC

## 2012-11-10 MED ORDER — INSULIN ASPART 100 UNIT/ML ~~LOC~~ SOLN
0.0000 [IU] | SUBCUTANEOUS | Status: DC
  Administered 2012-11-10 (×2): 11 [IU] via SUBCUTANEOUS
  Administered 2012-11-11: 3 [IU] via SUBCUTANEOUS
  Administered 2012-11-11: 4 [IU] via SUBCUTANEOUS

## 2012-11-10 MED ORDER — OXYCODONE HCL 5 MG/5ML PO SOLN: 5.0000 mg | Freq: Once | ORAL | Status: DC | PRN

## 2012-11-10 MED ORDER — MUPIROCIN 2 % EX OINT: TOPICAL_OINTMENT | Freq: Once | CUTANEOUS | Status: DC

## 2012-11-10 MED ORDER — BUPIVACAINE-EPINEPHRINE PF 0.5-1:200000 % IJ SOLN
INTRAMUSCULAR | Status: DC | PRN
  Administered 2012-11-10: 10 mL

## 2012-11-10 MED ORDER — SIMVASTATIN 20 MG PO TABS
20.0000 mg | ORAL_TABLET | Freq: Every evening | ORAL | Status: DC
  Administered 2012-11-10: 20 mg via ORAL
  Filled 2012-11-10 (×2): qty 1

## 2012-11-10 MED ORDER — LIDOCAINE HCL (CARDIAC) 20 MG/ML IV SOLN
INTRAVENOUS | Status: DC | PRN
  Administered 2012-11-10: 100 mg via INTRAVENOUS

## 2012-11-10 MED ORDER — SODIUM CHLORIDE 0.9 % IR SOLN
Status: DC | PRN
  Administered 2012-11-10: 09:00:00

## 2012-11-10 MED ORDER — GLIMEPIRIDE 2 MG PO TABS
2.0000 mg | ORAL_TABLET | Freq: Two times a day (BID) | ORAL | Status: DC
  Administered 2012-11-10: 2 mg via ORAL
  Filled 2012-11-10 (×3): qty 1

## 2012-11-10 MED ORDER — MUPIROCIN 2 % EX OINT
TOPICAL_OINTMENT | CUTANEOUS | Status: AC
  Filled 2012-11-10: qty 22

## 2012-11-10 MED ORDER — OXYCODONE HCL 5 MG PO TABS: 5.0000 mg | ORAL_TABLET | Freq: Once | ORAL | Status: DC | PRN

## 2012-11-10 MED ORDER — NEOSTIGMINE METHYLSULFATE 1 MG/ML IJ SOLN
INTRAMUSCULAR | Status: DC | PRN
  Administered 2012-11-10: 3 mg via INTRAVENOUS

## 2012-11-10 MED ORDER — ROCURONIUM BROMIDE 100 MG/10ML IV SOLN
INTRAVENOUS | Status: DC | PRN
  Administered 2012-11-10: 20 mg via INTRAVENOUS
  Administered 2012-11-10: 50 mg via INTRAVENOUS

## 2012-11-10 MED ORDER — ADULT MULTIVITAMIN W/MINERALS CH
1.0000 | ORAL_TABLET | Freq: Every day | ORAL | Status: DC
  Filled 2012-11-10: qty 1

## 2012-11-10 MED ORDER — HEMOSTATIC AGENTS (NO CHARGE) OPTIME
TOPICAL | Status: DC | PRN
  Administered 2012-11-10: 1 via TOPICAL

## 2012-11-10 MED ORDER — ONDANSETRON HCL 4 MG/2ML IJ SOLN
INTRAMUSCULAR | Status: DC | PRN
  Administered 2012-11-10: 4 mg via INTRAVENOUS

## 2012-11-10 MED ORDER — DEXAMETHASONE SODIUM PHOSPHATE 4 MG/ML IJ SOLN
INTRAMUSCULAR | Status: DC | PRN
  Administered 2012-11-10: 8 mg via INTRAVENOUS

## 2012-11-10 MED ORDER — LACTATED RINGERS IV SOLN
INTRAVENOUS | Status: DC
  Administered 2012-11-10: 13:00:00 via INTRAVENOUS

## 2012-11-10 MED ORDER — BACITRACIN 50000 UNITS IM SOLR
INTRAMUSCULAR | Status: AC
  Filled 2012-11-10: qty 1

## 2012-11-10 MED ORDER — DIAZEPAM 5 MG PO TABS: 5.0000 mg | ORAL_TABLET | Freq: Four times a day (QID) | ORAL | Status: DC | PRN

## 2012-11-10 MED ORDER — CARVEDILOL 12.5 MG PO TABS
12.5000 mg | ORAL_TABLET | Freq: Two times a day (BID) | ORAL | Status: DC
  Administered 2012-11-10: 12.5 mg via ORAL
  Filled 2012-11-10 (×4): qty 1

## 2012-11-10 MED ORDER — METFORMIN HCL 500 MG PO TABS
500.0000 mg | ORAL_TABLET | Freq: Every day | ORAL | Status: DC
  Filled 2012-11-10 (×2): qty 1

## 2012-11-10 MED ORDER — ONDANSETRON HCL 4 MG/2ML IJ SOLN
4.0000 mg | INTRAMUSCULAR | Status: DC | PRN
  Filled 2012-11-10: qty 2

## 2012-11-10 MED ORDER — HYDROCHLOROTHIAZIDE 12.5 MG PO CAPS
12.5000 mg | ORAL_CAPSULE | Freq: Every day | ORAL | Status: DC
  Administered 2012-11-10: 12.5 mg via ORAL
  Filled 2012-11-10 (×2): qty 1

## 2012-11-10 MED ORDER — LOSARTAN POTASSIUM 50 MG PO TABS
100.0000 mg | ORAL_TABLET | Freq: Every day | ORAL | Status: DC
  Administered 2012-11-10: 100 mg via ORAL
  Filled 2012-11-10 (×2): qty 2

## 2012-11-10 MED ORDER — 0.9 % SODIUM CHLORIDE (POUR BTL) OPTIME
TOPICAL | Status: DC | PRN
  Administered 2012-11-10: 1000 mL

## 2012-11-10 MED ORDER — SODIUM CHLORIDE 0.9 % IV SOLN
INTRAVENOUS | Status: AC
  Filled 2012-11-10: qty 500

## 2012-11-10 MED ORDER — MENTHOL 3 MG MT LOZG: 1.0000 | LOZENGE | OROMUCOSAL | Status: DC | PRN

## 2012-11-10 MED ORDER — AMLODIPINE BESYLATE 5 MG PO TABS
5.0000 mg | ORAL_TABLET | Freq: Every day | ORAL | Status: DC
  Filled 2012-11-10: qty 1

## 2012-11-10 MED ORDER — LACTATED RINGERS IV SOLN
INTRAVENOUS | Status: DC | PRN
  Administered 2012-11-10 (×2): via INTRAVENOUS

## 2012-11-10 MED ORDER — PROPOFOL 10 MG/ML IV BOLUS
INTRAVENOUS | Status: DC | PRN
  Administered 2012-11-10: 100 mg via INTRAVENOUS

## 2012-11-10 MED ORDER — BACITRACIN ZINC 500 UNIT/GM EX OINT
TOPICAL_OINTMENT | CUTANEOUS | Status: DC | PRN
  Administered 2012-11-10: 1 via TOPICAL

## 2012-11-10 MED ORDER — HYDROMORPHONE HCL PF 1 MG/ML IJ SOLN: 0.2500 mg | INTRAMUSCULAR | Status: DC | PRN

## 2012-11-10 MED ORDER — ACETAMINOPHEN 325 MG PO TABS: 650.0000 mg | ORAL_TABLET | ORAL | Status: DC | PRN

## 2012-11-10 MED ORDER — CEFAZOLIN SODIUM-DEXTROSE 2-3 GM-% IV SOLR
INTRAVENOUS | Status: DC | PRN
Start: 2012-11-10 — End: 2012-11-10
  Administered 2012-11-10: 2 g via INTRAVENOUS

## 2012-11-10 MED ORDER — CEFAZOLIN SODIUM-DEXTROSE 2-3 GM-% IV SOLR: 2.0000 g | Freq: Once | INTRAVENOUS | Status: DC

## 2012-11-10 MED ORDER — GLYCOPYRROLATE 0.2 MG/ML IJ SOLN
INTRAMUSCULAR | Status: DC | PRN
  Administered 2012-11-10: 0.4 mg via INTRAVENOUS

## 2012-11-10 MED ORDER — HYDROMORPHONE HCL 2 MG PO TABS
2.0000 mg | ORAL_TABLET | ORAL | Status: DC | PRN
  Administered 2012-11-10: 4 mg via ORAL
  Administered 2012-11-10 (×2): 2 mg via ORAL
  Administered 2012-11-11 (×2): 4 mg via ORAL
  Filled 2012-11-10: qty 1
  Filled 2012-11-10 (×3): qty 2
  Filled 2012-11-10: qty 1

## 2012-11-10 MED ORDER — MIDAZOLAM HCL 5 MG/5ML IJ SOLN
INTRAMUSCULAR | Status: DC | PRN
  Administered 2012-11-10: 2 mg via INTRAVENOUS

## 2012-11-10 MED ORDER — PHENYLEPHRINE HCL 10 MG/ML IJ SOLN
INTRAMUSCULAR | Status: DC | PRN
  Administered 2012-11-10: 80 ug via INTRAVENOUS
  Administered 2012-11-10: 120 ug via INTRAVENOUS
  Administered 2012-11-10: 40 ug via INTRAVENOUS
  Administered 2012-11-10: 80 ug via INTRAVENOUS

## 2012-11-10 MED ORDER — ACETAMINOPHEN 650 MG RE SUPP: 650.0000 mg | RECTAL | Status: DC | PRN

## 2012-11-10 MED ORDER — MEPERIDINE HCL 25 MG/ML IJ SOLN: 6.2500 mg | INTRAMUSCULAR | Status: DC | PRN

## 2012-11-10 MED ORDER — PHENOL 1.4 % MT LIQD: 1.0000 | OROMUCOSAL | Status: DC | PRN

## 2012-11-10 MED ORDER — CLONIDINE HCL 0.1 MG PO TABS
0.1000 mg | ORAL_TABLET | Freq: Two times a day (BID) | ORAL | Status: DC
  Administered 2012-11-10: 0.1 mg via ORAL
  Filled 2012-11-10 (×3): qty 1

## 2012-11-10 MED ORDER — DOCUSATE SODIUM 100 MG PO CAPS
100.0000 mg | ORAL_CAPSULE | Freq: Two times a day (BID) | ORAL | Status: DC
  Administered 2012-11-10 (×2): 100 mg via ORAL
  Filled 2012-11-10 (×3): qty 1

## 2012-11-10 MED ORDER — METFORMIN HCL 500 MG PO TABS
1000.0000 mg | ORAL_TABLET | Freq: Every day | ORAL | Status: DC
  Administered 2012-11-10: 1000 mg via ORAL
  Filled 2012-11-10 (×2): qty 2

## 2012-11-10 MED ORDER — ONDANSETRON HCL 4 MG/2ML IJ SOLN: 4.0000 mg | Freq: Once | INTRAMUSCULAR | Status: DC | PRN

## 2012-11-10 MED ORDER — ONE-DAILY MULTI VITAMINS PO TABS: 1.0000 | ORAL_TABLET | Freq: Every day | ORAL | Status: DC

## 2012-11-10 MED ORDER — CEFAZOLIN SODIUM-DEXTROSE 2-3 GM-% IV SOLR
2.0000 g | Freq: Three times a day (TID) | INTRAVENOUS | Status: AC
  Administered 2012-11-10 (×2): 2 g via INTRAVENOUS
  Filled 2012-11-10 (×2): qty 50

## 2012-11-10 MED ORDER — ZOLPIDEM TARTRATE 5 MG PO TABS: 5.0000 mg | ORAL_TABLET | Freq: Every evening | ORAL | Status: DC | PRN

## 2012-11-10 MED ORDER — ARTIFICIAL TEARS OP OINT
TOPICAL_OINTMENT | OPHTHALMIC | Status: DC | PRN
  Administered 2012-11-10: 1 via OPHTHALMIC

## 2012-11-10 MED ORDER — CEFAZOLIN SODIUM-DEXTROSE 2-3 GM-% IV SOLR
INTRAVENOUS | Status: AC
  Filled 2012-11-10: qty 50

## 2012-11-10 SURGICAL SUPPLY — 56 items
APL SKNCLS STERI-STRIP NONHPOA (GAUZE/BANDAGES/DRESSINGS) ×1
BAG DECANTER FOR FLEXI CONT (MISCELLANEOUS) ×2 IMPLANT
BENZOIN TINCTURE PRP APPL 2/3 (GAUZE/BANDAGES/DRESSINGS) ×2 IMPLANT
BLADE SURG ROTATE 9660 (MISCELLANEOUS) ×1 IMPLANT
BRUSH SCRUB EZ PLAIN DRY (MISCELLANEOUS) ×2 IMPLANT
BUR ACORN 6.0 (BURR) ×2 IMPLANT
BUR MATCHSTICK NEURO 3.0 LAGG (BURR) ×2 IMPLANT
CANISTER SUCTION 2500CC (MISCELLANEOUS) ×2 IMPLANT
CLOTH BEACON ORANGE TIMEOUT ST (SAFETY) ×2 IMPLANT
CONT SPEC 4OZ CLIKSEAL STRL BL (MISCELLANEOUS) ×2 IMPLANT
DRAPE LAPAROTOMY 100X72X124 (DRAPES) ×2 IMPLANT
DRAPE MICROSCOPE LEICA (MISCELLANEOUS) ×2 IMPLANT
DRAPE POUCH INSTRU U-SHP 10X18 (DRAPES) ×2 IMPLANT
DRAPE SURG 17X23 STRL (DRAPES) ×8 IMPLANT
ELECT BLADE 4.0 EZ CLEAN MEGAD (MISCELLANEOUS) ×2
ELECT REM PT RETURN 9FT ADLT (ELECTROSURGICAL) ×2
ELECTRODE BLDE 4.0 EZ CLN MEGD (MISCELLANEOUS) ×1 IMPLANT
ELECTRODE REM PT RTRN 9FT ADLT (ELECTROSURGICAL) ×1 IMPLANT
GAUZE SPONGE 4X4 16PLY XRAY LF (GAUZE/BANDAGES/DRESSINGS) IMPLANT
GLOVE BIO SURGEON STRL SZ8.5 (GLOVE) ×2 IMPLANT
GLOVE BIOGEL PI IND STRL 8 (GLOVE) IMPLANT
GLOVE BIOGEL PI INDICATOR 8 (GLOVE) ×2
GLOVE ECLIPSE 7.0 STRL STRAW (GLOVE) ×1 IMPLANT
GLOVE ECLIPSE 7.5 STRL STRAW (GLOVE) ×3 IMPLANT
GLOVE ECLIPSE 8.5 STRL (GLOVE) ×1 IMPLANT
GLOVE EXAM NITRILE LRG STRL (GLOVE) IMPLANT
GLOVE EXAM NITRILE MD LF STRL (GLOVE) IMPLANT
GLOVE EXAM NITRILE XL STR (GLOVE) IMPLANT
GLOVE EXAM NITRILE XS STR PU (GLOVE) IMPLANT
GLOVE SS BIOGEL STRL SZ 8 (GLOVE) ×1 IMPLANT
GLOVE SUPERSENSE BIOGEL SZ 8 (GLOVE) ×1
GOWN BRE IMP SLV AUR LG STRL (GOWN DISPOSABLE) IMPLANT
GOWN BRE IMP SLV AUR XL STRL (GOWN DISPOSABLE) ×6 IMPLANT
GOWN STRL REIN 2XL LVL4 (GOWN DISPOSABLE) ×1 IMPLANT
KIT BASIN OR (CUSTOM PROCEDURE TRAY) ×2 IMPLANT
KIT ROOM TURNOVER OR (KITS) ×2 IMPLANT
NDL HYPO 21X1.5 SAFETY (NEEDLE) IMPLANT
NEEDLE HYPO 21X1.5 SAFETY (NEEDLE) IMPLANT
NEEDLE HYPO 22GX1.5 SAFETY (NEEDLE) ×2 IMPLANT
NS IRRIG 1000ML POUR BTL (IV SOLUTION) ×2 IMPLANT
PACK LAMINECTOMY NEURO (CUSTOM PROCEDURE TRAY) ×2 IMPLANT
PAD ARMBOARD 7.5X6 YLW CONV (MISCELLANEOUS) ×8 IMPLANT
PATTIES SURGICAL .5 X1 (DISPOSABLE) IMPLANT
RUBBERBAND STERILE (MISCELLANEOUS) ×4 IMPLANT
SPONGE GAUZE 4X4 12PLY (GAUZE/BANDAGES/DRESSINGS) ×2 IMPLANT
SPONGE SURGIFOAM ABS GEL SZ50 (HEMOSTASIS) ×2 IMPLANT
STRIP CLOSURE SKIN 1/2X4 (GAUZE/BANDAGES/DRESSINGS) ×2 IMPLANT
SUT VIC AB 1 CT1 18XBRD ANBCTR (SUTURE) ×1 IMPLANT
SUT VIC AB 1 CT1 8-18 (SUTURE) ×2
SUT VIC AB 2-0 CP2 18 (SUTURE) ×2 IMPLANT
SYR 20CC LL (SYRINGE) IMPLANT
SYR 20ML ECCENTRIC (SYRINGE) ×2 IMPLANT
TAPE CLOTH SURG 4X10 WHT LF (GAUZE/BANDAGES/DRESSINGS) ×1 IMPLANT
TOWEL OR 17X24 6PK STRL BLUE (TOWEL DISPOSABLE) ×2 IMPLANT
TOWEL OR 17X26 10 PK STRL BLUE (TOWEL DISPOSABLE) ×2 IMPLANT
WATER STERILE IRR 1000ML POUR (IV SOLUTION) ×2 IMPLANT

## 2012-11-10 NOTE — Transfer of Care (Signed)
Immediate Anesthesia Transfer of Care Note  Patient: [name]  Procedure(s) Performed: Procedure(s) (LRB) with comments: LUMBAR LAMINECTOMY/DECOMPRESSION MICRODISCECTOMY 1 LEVEL (Right) - Right Lumbar four-five Diskectomy  Patient Location: PACU  Anesthesia Type:General  Level of Consciousness: awake, alert , sedated and patient cooperative  Airway & Oxygen Therapy: Patient Spontanous Breathing and Patient connected to nasal cannula oxygen  Post-op Assessment: Report given to PACU RN and Post -op Vital signs reviewed and stable  Post vital signs: Reviewed and stable  Complications: No apparent anesthesia complications

## 2012-11-10 NOTE — Anesthesia Postprocedure Evaluation (Signed)
Anesthesia Post Note  Patient: [name]  Procedure(s) Performed: Procedure(s) (LRB): LUMBAR LAMINECTOMY/DECOMPRESSION MICRODISCECTOMY 1 LEVEL (Right)  Anesthesia type: general  Patient location: PACU  Post pain: Pain level controlled  Post assessment: Patient's Cardiovascular Status Stable  Last Vitals:  Filed Vitals:   11/10/12 0935  BP:   Pulse:   Temp: 37 C  Resp:     Post vital signs: Reviewed and stable  Level of consciousness: sedated  Complications: No apparent anesthesia complications

## 2012-11-10 NOTE — Progress Notes (Signed)
Subjective:  The patient is somnolent but easily arousable. He looks well. He is in no apparent distress.  Objective: Vital signs in last 24 hours: Temp:  [98.8 F (37.1 C)] 98.8 F (37.1 C) (01/06 0630) Pulse Rate:  [95] 95  (01/06 0630) Resp:  [18] 18  (01/06 0630) BP: (129)/(75) 129/75 mmHg (01/06 0630) SpO2:  [93 %] 93 % (01/06 0630) Weight:  [108.863 kg (240 lb)] 108.863 kg (240 lb) (01/06 0630)  Intake/Output from previous day:   Intake/Output this shift: Total I/O In: 1050 [I.V.:1050] Out: 25 [Blood:25]  Physical exam the patient is somnolent but easily arousable. He is moving his lower extremities well.  Lab Results:  Basename 11/10/12 0724 11/10/12 0634  WBC -- 13.1*  HGB 14.3 13.8  HCT 42.0 41.8  PLT -- 247   BMET  Basename 11/10/12 0724 11/10/12 0634  NA 134* 134*  K 4.3 4.6  CL -- 92*  CO2 -- 32  GLUCOSE 205* 204*  BUN -- 22  CREATININE -- 0.89  CALCIUM -- 9.8    Studies/Results: Dg Chest 2 View  11/10/2012  *RADIOLOGY REPORT*  Clinical Data: Preoperative respiratory films.  CHEST - 2 VIEW  Comparison: PA and lateral chest 12/30/2009.  Findings: Lungs are clear.  Heart size is normal.  No pneumothorax or pleural fluid.  IMPRESSION: No acute disease.   Original Report Authenticated By: Holley Dexter, M.D.    Dg Lumbar Spine 1 View  11/10/2012  *RADIOLOGY REPORT*  Clinical Data: Lumbar microdiskectomy.  LUMBAR SPINE - 1 VIEW  Comparison: MRI of 11/07/2011  Findings: Single lateral view labeled 0820 hours.  This demonstrates a surgical device projecting posterior to the L5 vertebral body.  Just inferior to the L4-L5 intervertebral disc level.  Degenerative disc disease at L4-L5 and L5-S1.  IMPRESSION: Intraoperative localization of L5.   Original Report Authenticated By: Jeronimo Greaves, M.D.     Assessment/Plan: The patient is doing well.  LOS: 0 days     Reola Buckles D 11/10/2012, 9:43 AM

## 2012-11-10 NOTE — Anesthesia Preprocedure Evaluation (Addendum)
Anesthesia Evaluation  Patient identified by MRN, date of birth, ID band Patient awake    Reviewed: Allergy & Precautions, H&P , NPO status , Patient's Chart, lab work & pertinent test results  Airway Mallampati: II TM Distance: >3 FB Neck ROM: Full    Dental  (+) Upper Dentures and Edentulous Lower   Pulmonary shortness of breath and with exertion, asthma , neg pneumonia -,  Clinical Data: Pneumonia, cough.    CHEST - 2 VIEW    Comparison: 12/22/2009    Findings: There is hyperinflation of the lungs compatible with COPD.  Heart and mediastinal contours are within normal limits.  No focal opacities or effusions.  No acute bony abnormality.    IMPRESSION: No active disease. 12-30-09 breath sounds clear to auscultation  Pulmonary exam normal       Cardiovascular Exercise Tolerance: Good hypertension, Pt. on home beta blockers + CAD and + Peripheral Vascular Disease + dysrhythmias Rhythm:Regular Rate:Normal  10-Nov-2012 06:31:04 [facility] ROUTINE RECORD Normal sinus rhythm Rightward axis Borderline ECG   Neuro/Psych CVA, No Residual Symptoms    GI/Hepatic Neg liver ROS, GERD-  Controlled and Medicated,  Endo/Other  diabetes, Well Controlled, Type 2, Oral Hypoglycemic Agents  Renal/GU negative Renal ROS     Musculoskeletal  (+) Arthritis -, Osteoarthritis,    Abdominal Normal abdominal exam  (+)   Peds  Hematology   Anesthesia Other Findings   Reproductive/Obstetrics                        Anesthesia Physical Anesthesia Plan  ASA: III  Anesthesia Plan: General   Post-op Pain Management:    Induction: Intravenous  Airway Management Planned: Oral ETT  Additional Equipment:   Intra-op Plan:   Post-operative Plan: Extubation in OR  Informed Consent: I have reviewed the patients History and Physical, chart, labs and discussed the procedure including the risks,  benefits and alternatives for the proposed anesthesia with the patient or authorized representative who has indicated his/her understanding and acceptance.   Dental advisory given  Plan Discussed with: Surgeon and CRNA  Anesthesia Plan Comments:        Anesthesia Quick Evaluation

## 2012-11-10 NOTE — Anesthesia Procedure Notes (Signed)
Procedure Name: Intubation Date/Time: 11/10/2012 7:45 AM Performed by: Tyrone Nine Pre-anesthesia Checklist: Patient identified, Emergency Drugs available, Suction available, Patient being monitored and Timeout performed Patient Re-evaluated:Patient Re-evaluated prior to inductionOxygen Delivery Method: Circle system utilized Preoxygenation: Pre-oxygenation with 100% oxygen Intubation Type: IV induction Ventilation: Mask ventilation without difficulty and Oral airway inserted - appropriate to patient size Laryngoscope Size: Mac and 3 Grade View: Grade I Tube type: Oral Tube size: 8.0 mm Number of attempts: 1 Airway Equipment and Method: Stylet Placement Confirmation: ETT inserted through vocal cords under direct vision,  positive ETCO2,  CO2 detector and breath sounds checked- equal and bilateral Secured at: 23 cm Tube secured with: Tape Dental Injury: Teeth and Oropharynx as per pre-operative assessment

## 2012-11-10 NOTE — Preoperative (Addendum)
Beta Blockers   Coreg taken on     11-10-12 @  5am

## 2012-11-10 NOTE — Op Note (Signed)
Brief history: The patient is a 67 year old white male who has complained of back and right leg pain consistent with a lumbar radiculopathy. He has failed medical management and was worked up with a lumbar MRI. This demonstrated a large herniated disc behind the L5 vertebral body which was compressing both the right L5 and S1 nerve root. I discussed the various treatment options including surgery. The patient has weighed the risks, benefits, and alternatives surgery and decided proceed with a right L4-5 hemilaminectomy and discectomy to decompress the right L5 and S1 nerve roots.  Preoperative diagnosis: Right L4-5 herniated disc with compression of the right L5 and S1 nerve root, lumbar stenosis, lumbar radiculopathy, lumbago  Postoperative diagnosis: The same  Procedure: Right L5 hemilaminectomy, right L4 laminotomy for discectomy and decompression of the right L5 and S1 nerve root using microdissection.   Surgeon: Dr. Delma Officer  Asst.: Dr. Mardelle Matte pool  Anesthesia: Gen. endotracheal  Estimated blood loss: 50 cc  Drains: None  Complications: None  Description of procedure: The patient was brought to the operating room by the anesthesia team. General endotracheal anesthesia was induced. The patient was turned to the prone position on the Wilson frame. The patient's lumbosacral region was then prepared with Betadine scrub and Betadine solution. Sterile drapes were applied.  I then injected the area to be incised with Marcaine with epinephrine solution. I then used a scalpel to make a linear midline incision over the L5-S1 intervertebral disc space. I then used electrocautery to perform a right sided subperiosteal dissection exposing the spinous process and lamina of L4, L5 and the upper sacrum. We obtained intraoperative radiograph to confirm our location. I then inserted the [facility] retractor for exposure.  We then brought the operative microscope into the field. Under its magnification  and illumination we completed the microdissection. I used a high-speed drill to perform a laminotomy at L5 and L4 on the right. I then used a Kerrison punches to widen the laminotomy and removed the ligamentum flavum at L4-5 and L5-S1. We then used microdissection to free up the thecal sac and the right L5 and S1 nerve root from the epidural tissue. I then used a Kerrison punch to perform a foraminotomy at about the right L5 and S1 nerve root. We then using the nerve root retractor to gently retract the thecal sac and the right L5 and S1 nerve root medially. This exposed the intervertebral disc. We identified the ruptured disc and remove it with the pituitary forceps decompressing the right L5 and S1 nerve roots.  I then palpated along the ventral surface of the thecal sac and along exit route of the right L5 and S1 nerve root and noted that the neural structures were well decompressed. This completed the decompression.  We then obtained hemostasis using bipolar electrocautery. We irrigated the wound out with bacitracin solution. We then removed the retractor. We then reapproximated the patient's thoracolumbar fascia with interrupted #1 Vicryl suture. We then reapproximated the patient's subcutaneous tissue with interrupted 3-0 Vicryl suture. We then reapproximated patient's skin with Steri-Strips and benzoin. The was then coated with bacitracin ointment. The drapes were removed. The patient was subsequently returned to the supine position where they were extubated by the anesthesia team. The patient was then transported to the postanesthesia care unit in stable condition. All sponge instrument and needle counts were reportedly correct at the end of this case.

## 2012-11-10 NOTE — H&P (Signed)
Subjective: The patient is a 67 year old white male who has been suffering from back and right leg pain consistent with a lumbar radiculopathy. He has failed medical management and was worked up with a lumbar MRI. This demonstrated a large herniated disc at L4-5 which had migrated caudally over the L5 vertebral body. with compression of both the right L 5 and S1 nerve root. I discussed the various treatment option with the patient and his family. The patient has weighed the risks, benefits, and alternatives surgery and decided proceed with a right L4-4-5 discectomy.   Past Medical History  Diagnosis Date  . Hypertension   . Arthritis   . Hyperlipidemia   . History of nephrolithiasis   . PVD (peripheral vascular disease)     right carotid artery  . History of AAA (abdominal aortic aneurysm) repair 2008    3 cm  . Bulging disc 1984    back  . Dysrhythmia     "skips beats at times"  . Shortness of breath     exertion  . Pneumonia     hx of  . Asthma   . Diabetes mellitus, type 2     fasting avg 130s  . Stroke     no residual  . GERD (gastroesophageal reflux disease)     Past Surgical History  Procedure Date  . Carotid endarterectomy 2006    right carotid artery  . Back surgery 85  . Knee surgery   . Ankle surgery   . Leg surgery   . Wrist fracture surgery     No Known Allergies  History  Substance Use Topics  . Smoking status: Former Games developer  . Smokeless tobacco: Never Used  . Alcohol Use: No     Comment: social    Family History  Problem Relation Age of Onset  . Diabetes Mother   . Cancer Father     lung  . Stroke Father    Prior to Admission medications   Medication Sig Start Date End Date Taking? Authorizing Provider  acetaminophen (TYLENOL) 500 MG tablet Take 1,500 mg by mouth 2 (two) times daily as needed. For pain   Yes Historical Provider, MD  amLODipine (NORVASC) 5 MG tablet Take 5 mg by mouth daily.   Yes Edwyna Perfect, MD  Ascorbic Acid (VITAMIN C)  1000 MG tablet Take 1,000 mg by mouth daily.   Yes Historical Provider, MD  aspirin 81 MG tablet Take 81 mg by mouth daily.    Yes Historical Provider, MD  budesonide-formoterol (SYMBICORT) 160-4.5 MCG/ACT inhaler Inhale 2 puffs into the lungs 2 (two) times daily as needed. Shortness of breath   Yes Dois Davenport, MD  Calcium Carbonate-Vit D-Min (CALCIUM 600+D PLUS MINERALS) 600-400 MG-UNIT TABS Take 1 tablet by mouth daily.    Yes Historical Provider, MD  carvedilol (COREG) 25 MG tablet Take 12.5 mg by mouth 2 (two) times daily with a meal.   Yes Historical Provider, MD  cloNIDine (CATAPRES) 0.2 MG tablet Take 0.1 mg by mouth 2 (two) times daily.   Yes Edwyna Perfect, MD  cyclobenzaprine (FLEXERIL) 5 MG tablet Take 5 mg by mouth every 8 (eight) hours as needed. 10/31/12  Yes Shade Flood, MD  fish oil-omega-3 fatty acids 1000 MG capsule Take 1 g by mouth daily.    Yes Historical Provider, MD  glimepiride (AMARYL) 2 MG tablet Take 2 mg by mouth 2 (two) times daily.   Yes Edwyna Perfect, MD  glucose blood (  FREESTYLE LITE) test strip Use 1 strip as instructed    Yes Historical Provider, MD  hydrochlorothiazide (MICROZIDE) 12.5 MG capsule Take 12.5 mg by mouth daily. TAKE ONE CAPSULE BY MOUTH EVERY DAY   Yes Edwyna Perfect, MD  HYDROmorphone (DILAUDID) 4 MG tablet Take 0.5-1 tablets (2-4 mg total) by mouth every 4 (four) hours as needed for pain. 11/06/12  Yes Arthor Captain, PA-C  losartan (COZAAR) 100 MG tablet Take 100 mg by mouth daily.   Yes Edwyna Perfect, MD  metFORMIN (GLUCOPHAGE) 500 MG tablet Take 500-1,000 mg by mouth 2 (two) times daily with a meal. 1 tab with AM meal, and 2 tabs with evening meal   Yes Edwyna Perfect, MD  Multiple Vitamin (MULTIVITAMIN) tablet Take 1 tablet by mouth daily.     Yes Historical Provider, MD  oxyCODONE-acetaminophen (PERCOCET/ROXICET) 5-325 MG per tablet Take 2 tablets by mouth every 6 (six) hours as needed. For pain   Yes Shade Flood, MD    predniSONE (DELTASONE) 20 MG tablet Take 20 mg by mouth 2 (two) times daily.   Yes Shade Flood, MD  simvastatin (ZOCOR) 40 MG tablet Take 20 mg by mouth every evening.   Yes Edwyna Perfect, MD  Insulin Pen Needle (RELION PEN NEEDLE 31G/8MM) 31G X 8 MM MISC by Does not apply route. Use once daily as directed.     Historical Provider, MD  Lancets (FREESTYLE) lancets Use as instructed once a day.     Historical Provider, MD     Review of Systems  Positive ROS: As above  All other systems have been reviewed and were otherwise negative with the exception of those mentioned in the HPI and as above.  Objective: Vital signs in last 24 hours: Temp:  [98.8 F (37.1 C)] 98.8 F (37.1 C) (01/06 0630) Pulse Rate:  [95] 95  (01/06 0630) Resp:  [18] 18  (01/06 0630) BP: (129)/(75) 129/75 mmHg (01/06 0630) SpO2:  [93 %] 93 % (01/06 0630) Weight:  [108.863 kg (240 lb)] 108.863 kg (240 lb) (01/06 0630)  General Appearance: Alert, cooperative, no distress, appears stated age Head: Normocephalic, without obvious abnormality, atraumatic Eyes: PERRL, conjunctiva/corneas clear, EOM's intact, fundi benign, both eyes      Ears: Normal TM's and external ear canals, both ears Throat: Lips, mucosa, and tongue normal; teeth and gums normal Neck: Supple, symmetrical, trachea midline, no adenopathy; thyroid: No enlargement/tenderness/nodules; no carotid bruit or JVD Back: Symmetric, no curvature, ROM normal, no CVA tenderness Lungs: Clear to auscultation bilaterally, respirations unlabored Heart: Regular rate and rhythm, S1 and S2 normal, no murmur, rub or gallop Abdomen: Soft, non-tender, bowel sounds active all four quadrants, no masses, no organomegaly Extremities: Extremities normal, atraumatic, no cyanosis or edema Pulses: 2+ and symmetric all extremities Skin: Skin color, texture, turgor normal, no rashes or lesions  NEUROLOGIC:   Mental status: alert and oriented, no aphasia, good attention  span, Fund of knowledge/ memory ok Motor Exam - grossly normal Sensory Exam - grossly normal Reflexes:  Coordination - grossly normal Gait - grossly normal Balance - grossly normal Cranial Nerves: I: smell Not tested  II: visual acuity  OS: Normal    OD: Normal   II: visual fields Full to confrontation  II: pupils Equal, round, reactive to light  III,VII: ptosis None  III,IV,VI: extraocular muscles  Full ROM  V: mastication Normal  V: facial light touch sensation  Normal  V,VII: corneal reflex  Present  VII: facial  muscle function - upper  Normal  VII: facial muscle function - lower Normal  VIII: hearing Not tested  IX: soft palate elevation  Normal  IX,X: gag reflex Present  XI: trapezius strength  5/5  XI: sternocleidomastoid strength 5/5  XI: neck flexion strength  5/5  XII: tongue strength  Normal    Data Review Lab Results  Component Value Date   WBC 13.1* 11/10/2012   HGB 14.3 11/10/2012   HCT 42.0 11/10/2012   MCV 96.5 11/10/2012   PLT 247 11/10/2012   Lab Results  Component Value Date   NA 134* 11/10/2012   K 4.3 11/10/2012   CL 92* 11/10/2012   CO2 32 11/10/2012   BUN 22 11/10/2012   CREATININE 0.89 11/10/2012   GLUCOSE 205* 11/10/2012   No results found for this basename: INR, PROTIME    Assessment/Plan: Right L4-5 herniated disc, right L5-S1 radiculopathy, lumbago: The patient's symptoms are consistent the right L5 and S1 radiculopathy. I discussed the various treatment with the patient and his family. I reviewed the MR scan with them. We have discussed the treatment options of a right L5 hemilaminectomy for decompression of the right L5 and S1 nerve root. I described the surgery to them. I've shown him surgical models. I discussed the risks, benefits, alternatives, and likelihood of achieving our goals with surgery. The patient has decided proceed with surgery.   Yadier Bramhall D 11/10/2012 7:33 AM

## 2012-11-10 NOTE — Clinical Social Work Note (Signed)
Clinical Social Work   CSW received consult for SNF. CSW reviewed chart. Awaiting PT evals for discharge recommendations. CSW will assess for SNF, if appropriate. Please call with any urgent needs. CSW will continue to follow.   Dede Query, MSW, [facility] [phone]

## 2012-11-11 ENCOUNTER — Encounter (HOSPITAL_COMMUNITY): Payer: Self-pay | Admitting: Neurosurgery

## 2012-11-11 LAB — GLUCOSE, CAPILLARY
Glucose-Capillary: 148 mg/dL — ABNORMAL HIGH (ref 70–99)
Glucose-Capillary: 157 mg/dL — ABNORMAL HIGH (ref 70–99)

## 2012-11-11 MED ORDER — HYDROMORPHONE HCL 2 MG PO TABS: 2.0000 mg | ORAL_TABLET | ORAL | Status: DC | PRN

## 2012-11-11 MED ORDER — DIAZEPAM 5 MG PO TABS: 5.0000 mg | ORAL_TABLET | Freq: Four times a day (QID) | ORAL | Status: DC | PRN

## 2012-11-11 MED FILL — Mupirocin Oint 2%: CUTANEOUS | Qty: 22 | Status: AC

## 2012-11-11 NOTE — Clinical Social Work Note (Signed)
Clinical Social Work   Pt discharged prior to CSW follow up. CSW is signing off.   Dede Query, MSW, [facility] [phone]

## 2012-11-11 NOTE — Progress Notes (Signed)
Physical Therapy Note 11/11/12 @ 08:31am  Pt d/c'd from hospital prior to PT eval completion.  Lewis Shock, PT, DPT Pager #: [phone] Office #: [phone]

## 2012-11-11 NOTE — Care Management Note (Signed)
    Page 1 of 1   11/11/2012     12:16:31 PM   CARE MANAGEMENT NOTE 11/11/2012  Patient:  [name], [name]   Account Number:  [id number]  Date Initiated:  11/10/2012  Documentation initiated by:  [facility]  Subjective/Objective Assessment:   Admitted postop L4-5 laminectomy, L5-S1 microdissection     Action/Plan:   Anticipated DC Date:  11/13/2012   Anticipated DC Plan:  HOME/SELF CARE      DC Planning Services  CM consult      Choice offered to / List presented to:             Status of service:  Completed, signed off Medicare Important Message given?   (If response is "NO", the following Medicare IM given date fields will be blank) Date Medicare IM given:   Date Additional Medicare IM given:    Discharge Disposition:  HOME/SELF CARE  Per UR Regulation:  Reviewed for med. necessity/level of care/duration of stay  If discussed at Long Length of Stay Meetings, dates discussed:    Comments:

## 2012-11-11 NOTE — Discharge Summary (Signed)
Physician Discharge Summary  Patient ID: [name] MRN: [id number] DOB/AGE: 10-Nov-1945 67 y.o.  Admit date: 11/10/2012 Discharge date: 11/11/2012  Admission Diagnoses: L4-5, L5-S1 herniated disc, lumbar discopathy, lumbago  Discharge Diagnoses: The same Principal Problem:  *Lumbar herniated disc   Discharged Condition: good  Hospital Course:  I admitted the patient to [facility] on 11/10/2012. On that day I performed a right L4-5 and 5 one discectomy. The surgery went well.  The patient's postoperative course was unremarkable. By postop day #1 his leg pain was gone and he was requesting discharge home. The patient was given oral and written discharge instructions. All his questions were answered.  Consults: None Significant Diagnostic Studies: None Treatments: Right L4-5 and L5-S1 discectomy using microdissection Discharge Exam: Blood pressure 113/66, pulse 75, temperature 98.7 F (37.1 C), temperature source Oral, resp. rate 20, height 5\' 9"  (1.753 m), weight 108.863 kg (240 lb), SpO2 95.00%. The patient is alert and oriented. His strength is normal. His dressing is clean and dry.  Disposition: Home  Discharge Orders    Future Appointments: Provider: Department: Dept Phone: Center:   12/15/2012 2:00 PM Edwyna Perfect, MD [facility] [phone] [facility]     Future Orders Please Complete By Expires   Diet - low sodium heart healthy      Increase activity slowly      Discharge instructions      Comments:   Call [phone] for followup appointment.   Remove dressing in 48 hours      Call MD for:  temperature >100.4      Call MD for:  persistant nausea and vomiting      Call MD for:  severe uncontrolled pain      Call MD for:  redness, tenderness, or signs of infection (pain, swelling, redness, odor or green/yellow discharge around incision site)      Call MD for:  difficulty breathing, headache or visual disturbances      Call MD  for:  hives      Call MD for:  persistant dizziness or light-headedness      Call MD for:  extreme fatigue          Medication List     As of 11/11/2012  7:50 AM    STOP taking these medications         acetaminophen 500 MG tablet   Commonly known as: TYLENOL      cyclobenzaprine 5 MG tablet   Commonly known as: FLEXERIL      oxyCODONE-acetaminophen 5-325 MG per tablet   Commonly known as: PERCOCET/ROXICET      predniSONE 20 MG tablet   Commonly known as: DELTASONE      TAKE these medications         amLODipine 5 MG tablet   Commonly known as: NORVASC   Take 5 mg by mouth daily.      aspirin 81 MG tablet   Take 81 mg by mouth daily.      budesonide-formoterol 160-4.5 MCG/ACT inhaler   Commonly known as: SYMBICORT   Inhale 2 puffs into the lungs 2 (two) times daily as needed. Shortness of breath      Calcium 600+D Plus Minerals 600-400 MG-UNIT Tabs   Take 1 tablet by mouth daily.      carvedilol 25 MG tablet   Commonly known as: COREG   Take 12.5 mg by mouth 2 (two) times daily with a meal.  cloNIDine 0.2 MG tablet   Commonly known as: CATAPRES   Take 0.1 mg by mouth 2 (two) times daily.      diazepam 5 MG tablet   Commonly known as: VALIUM   Take 1 tablet (5 mg total) by mouth every 6 (six) hours as needed.      fish oil-omega-3 fatty acids 1000 MG capsule   Take 1 g by mouth daily.      freestyle lancets   Use as instructed once a day.      FREESTYLE LITE test strip   Generic drug: glucose blood   Use 1 strip as instructed      glimepiride 2 MG tablet   Commonly known as: AMARYL   Take 2 mg by mouth 2 (two) times daily.      hydrochlorothiazide 12.5 MG capsule   Commonly known as: MICROZIDE   Take 12.5 mg by mouth daily. TAKE ONE CAPSULE BY MOUTH EVERY DAY      HYDROmorphone 4 MG tablet   Commonly known as: DILAUDID   Take 0.5-1 tablets (2-4 mg total) by mouth every 4 (four) hours as needed for pain.      HYDROmorphone 2 MG tablet    Commonly known as: DILAUDID   Take 1-2 tablets (2-4 mg total) by mouth every 4 (four) hours as needed.      losartan 100 MG tablet   Commonly known as: COZAAR   Take 100 mg by mouth daily.      metFORMIN 500 MG tablet   Commonly known as: GLUCOPHAGE   Take 500-1,000 mg by mouth 2 (two) times daily with a meal. 1 tab with AM meal, and 2 tabs with evening meal      multivitamin tablet   Take 1 tablet by mouth daily.      RELION PEN NEEDLE 31G/8MM 31G X 8 MM Misc   Generic drug: Insulin Pen Needle   by Does not apply route. Use once daily as directed.      simvastatin 40 MG tablet   Commonly known as: ZOCOR   Take 20 mg by mouth every evening.      vitamin C 1000 MG tablet   Take 1,000 mg by mouth daily.         SignedTressie Stalker D 11/11/2012, 7:50 AM

## 2012-12-11 ENCOUNTER — Telehealth: Payer: Self-pay

## 2012-12-11 DIAGNOSIS — E119 Type 2 diabetes mellitus without complications: Secondary | ICD-10-CM

## 2012-12-11 DIAGNOSIS — E785 Hyperlipidemia, unspecified: Secondary | ICD-10-CM

## 2012-12-11 NOTE — Telephone Encounter (Signed)
Labs entered for [facility]. 

## 2012-12-12 ENCOUNTER — Ambulatory Visit: Payer: Medicare Other

## 2012-12-12 DIAGNOSIS — E119 Type 2 diabetes mellitus without complications: Secondary | ICD-10-CM

## 2012-12-12 DIAGNOSIS — E785 Hyperlipidemia, unspecified: Secondary | ICD-10-CM

## 2012-12-12 LAB — MICROALBUMIN / CREATININE URINE RATIO
Creatinine,U: 137.8 mg/dL
Microalb Creat Ratio: 0.4 mg/g (ref 0.0–30.0)
Microalb, Ur: 0.6 mg/dL (ref 0.0–1.9)

## 2012-12-12 LAB — HEMOGLOBIN A1C: Hgb A1c MFr Bld: 7.6 % — ABNORMAL HIGH (ref 4.6–6.5)

## 2012-12-12 LAB — CBC
HCT: 39.3 % (ref 39.0–52.0)
Hemoglobin: 13.2 g/dL (ref 13.0–17.0)
MCHC: 33.6 g/dL (ref 30.0–36.0)
MCV: 94.8 fl (ref 78.0–100.0)
Platelets: 232 10*3/uL (ref 150.0–400.0)
RBC: 4.14 Mil/uL — ABNORMAL LOW (ref 4.22–5.81)
RDW: 14.5 % (ref 11.5–14.6)
WBC: 11.2 10*3/uL — ABNORMAL HIGH (ref 4.5–10.5)

## 2012-12-12 LAB — LIPID PANEL
Cholesterol: 164 mg/dL (ref 0–200)
HDL: 28.6 mg/dL — ABNORMAL LOW (ref >39.00)
Total CHOL/HDL Ratio: 6
Triglycerides: 224 mg/dL — ABNORMAL HIGH (ref 0.0–149.0)
VLDL: 44.8 mg/dL — ABNORMAL HIGH (ref 0.0–40.0)

## 2012-12-12 LAB — HEPATIC FUNCTION PANEL
ALT: 29 U/L (ref 0–53)
AST: 30 U/L (ref 0–37)
Albumin: 3.8 g/dL (ref 3.5–5.2)
Alkaline Phosphatase: 79 U/L (ref 39–117)
Bilirubin, Direct: 0.1 mg/dL (ref 0.0–0.3)
Total Bilirubin: 0.5 mg/dL (ref 0.3–1.2)
Total Protein: 6.8 g/dL (ref 6.0–8.3)

## 2012-12-12 LAB — BASIC METABOLIC PANEL
BUN: 14 mg/dL (ref 6–23)
CO2: 31 mEq/L (ref 19–32)
Calcium: 9.3 mg/dL (ref 8.4–10.5)
Chloride: 103 mEq/L (ref 96–112)
Creatinine, Ser: 0.9 mg/dL (ref 0.4–1.5)
GFR: 96.05 mL/min (ref >60.00)
Glucose, Bld: 159 mg/dL — ABNORMAL HIGH (ref 70–99)
Potassium: 4.5 mEq/L (ref 3.5–5.1)
Sodium: 141 mEq/L (ref 135–145)

## 2012-12-12 LAB — LDL CHOLESTEROL, DIRECT: Direct LDL: 104.9 mg/dL

## 2012-12-15 ENCOUNTER — Encounter: Payer: Self-pay | Admitting: Family Medicine

## 2012-12-15 ENCOUNTER — Ambulatory Visit (INDEPENDENT_AMBULATORY_CARE_PROVIDER_SITE_OTHER): Payer: Medicare Other | Admitting: Family Medicine

## 2012-12-15 VITALS — BP 148/80 | HR 98 | Temp 97.7°F | Ht 69.0 in | Wt 245.1 lb

## 2012-12-15 DIAGNOSIS — M5126 Other intervertebral disc displacement, lumbar region: Secondary | ICD-10-CM

## 2012-12-15 DIAGNOSIS — E785 Hyperlipidemia, unspecified: Secondary | ICD-10-CM

## 2012-12-15 DIAGNOSIS — E1149 Type 2 diabetes mellitus with other diabetic neurological complication: Secondary | ICD-10-CM

## 2012-12-15 DIAGNOSIS — E1142 Type 2 diabetes mellitus with diabetic polyneuropathy: Secondary | ICD-10-CM

## 2012-12-15 DIAGNOSIS — I10 Essential (primary) hypertension: Secondary | ICD-10-CM

## 2012-12-15 NOTE — Assessment & Plan Note (Signed)
Just underwent surgery and is still in pain although it is better. Noted to have mildly elevated wbc on labs but likely due to recent surgery and steroids, no s or s of infection.

## 2012-12-15 NOTE — Assessment & Plan Note (Signed)
Mild elevation today , no changes, encouraged to minimize sodium and caffeine and reassess at next visit.

## 2012-12-15 NOTE — Assessment & Plan Note (Signed)
Increasing hemoglobin A1c the patient did just have spinal surgery any more and moving loss. Reiterated need to watch carbs and monitor sugars. He is to call if his blood sugars begin to consistently run above 150 fasting. In the past he has increased his metformin to 2000 or more milligrams daily but unfortunately developed diarrhea. For now we'll continue with 500 mg in a.m and a thousand milligrams in p.m. Should consider an extra 500 mg during the day or we could switch to extended release.
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
## 2012-12-15 NOTE — Patient Instructions (Addendum)

## 2012-12-15 NOTE — Assessment & Plan Note (Signed)
Mild on meds, continue Simvastatin, avoid trans fats, minimize simple carbs andrecheck at next visit.

## 2012-12-16 ENCOUNTER — Encounter: Payer: Self-pay | Admitting: Family Medicine

## 2013-02-09 ENCOUNTER — Telehealth: Payer: Self-pay | Admitting: Internal Medicine

## 2013-02-09 MED ORDER — LOSARTAN POTASSIUM 100 MG PO TABS: 100.0000 mg | ORAL_TABLET | Freq: Every day | ORAL | Status: DC

## 2013-02-09 MED ORDER — SIMVASTATIN 40 MG PO TABS: 20.0000 mg | ORAL_TABLET | Freq: Every evening | ORAL | Status: DC

## 2013-02-09 MED ORDER — HYDROCHLOROTHIAZIDE 12.5 MG PO CAPS: 12.5000 mg | ORAL_CAPSULE | Freq: Every day | ORAL | Status: DC

## 2013-02-09 MED ORDER — AMLODIPINE BESYLATE 5 MG PO TABS: 5.0000 mg | ORAL_TABLET | Freq: Every day | ORAL | Status: DC

## 2013-02-09 MED ORDER — GLIMEPIRIDE 2 MG PO TABS: 2.0000 mg | ORAL_TABLET | Freq: Two times a day (BID) | ORAL | Status: DC

## 2013-02-09 NOTE — Telephone Encounter (Signed)
AMARYL 2 MG  HCTZ 12.5  LOSARTIN 100 MG SIMVASTATIN 40 MG  AMLODEPINE 5 MG

## 2013-02-09 NOTE — Telephone Encounter (Signed)
Rx request to pharmacy/[facility]  

## 2013-03-23 ENCOUNTER — Other Ambulatory Visit: Payer: Self-pay

## 2013-03-23 MED ORDER — LOSARTAN POTASSIUM 100 MG PO TABS: 100.0000 mg | ORAL_TABLET | Freq: Every day | ORAL | Status: DC

## 2013-03-23 MED ORDER — AMLODIPINE BESYLATE 5 MG PO TABS: 5.0000 mg | ORAL_TABLET | Freq: Every day | ORAL | Status: DC

## 2013-03-23 MED ORDER — GLIMEPIRIDE 2 MG PO TABS: 2.0000 mg | ORAL_TABLET | Freq: Two times a day (BID) | ORAL | Status: DC

## 2013-03-23 MED ORDER — CARVEDILOL 25 MG PO TABS: 12.5000 mg | ORAL_TABLET | Freq: Two times a day (BID) | ORAL | Status: DC

## 2013-03-23 MED ORDER — METFORMIN HCL 500 MG PO TABS: 500.0000 mg | ORAL_TABLET | Freq: Two times a day (BID) | ORAL | Status: DC

## 2013-03-23 MED ORDER — HYDROCHLOROTHIAZIDE 12.5 MG PO CAPS: 12.5000 mg | ORAL_CAPSULE | Freq: Every day | ORAL | Status: DC

## 2013-03-23 MED ORDER — CLONIDINE HCL 0.2 MG PO TABS: 0.1000 mg | ORAL_TABLET | Freq: Two times a day (BID) | ORAL | Status: DC

## 2013-03-23 NOTE — Telephone Encounter (Signed)
Please advise Clonidine refill?  If ok fax to [phone]

## 2013-04-10 ENCOUNTER — Ambulatory Visit: Payer: Medicare Other | Admitting: Family Medicine

## 2013-04-29 ENCOUNTER — Ambulatory Visit (INDEPENDENT_AMBULATORY_CARE_PROVIDER_SITE_OTHER): Payer: Medicare Other | Admitting: Family Medicine

## 2013-04-29 ENCOUNTER — Encounter: Payer: Self-pay | Admitting: Family Medicine

## 2013-04-29 ENCOUNTER — Ambulatory Visit (HOSPITAL_BASED_OUTPATIENT_CLINIC_OR_DEPARTMENT_OTHER)
Admission: RE | Admit: 2013-04-29 | Discharge: 2013-04-29 | Disposition: A | Discharge status: Home / Self Care | Payer: Medicare Other | Source: Ambulatory Visit | Attending: Family Medicine | Admitting: Family Medicine

## 2013-04-29 VITALS — BP 128/80 | HR 91 | Temp 98.1°F | Ht 69.0 in | Wt 248.0 lb

## 2013-04-29 DIAGNOSIS — E1142 Type 2 diabetes mellitus with diabetic polyneuropathy: Secondary | ICD-10-CM

## 2013-04-29 DIAGNOSIS — M79605 Pain in left leg: Secondary | ICD-10-CM

## 2013-04-29 DIAGNOSIS — M25552 Pain in left hip: Secondary | ICD-10-CM

## 2013-04-29 DIAGNOSIS — M25559 Pain in unspecified hip: Secondary | ICD-10-CM

## 2013-04-29 DIAGNOSIS — M79609 Pain in unspecified limb: Secondary | ICD-10-CM

## 2013-04-29 DIAGNOSIS — I10 Essential (primary) hypertension: Secondary | ICD-10-CM

## 2013-04-29 DIAGNOSIS — E1149 Type 2 diabetes mellitus with other diabetic neurological complication: Secondary | ICD-10-CM

## 2013-04-29 DIAGNOSIS — E785 Hyperlipidemia, unspecified: Secondary | ICD-10-CM

## 2013-04-29 DIAGNOSIS — M79672 Pain in left foot: Secondary | ICD-10-CM

## 2013-04-29 DIAGNOSIS — G609 Hereditary and idiopathic neuropathy, unspecified: Secondary | ICD-10-CM

## 2013-04-29 DIAGNOSIS — E119 Type 2 diabetes mellitus without complications: Secondary | ICD-10-CM

## 2013-04-29 DIAGNOSIS — M79671 Pain in right foot: Secondary | ICD-10-CM

## 2013-04-29 LAB — RENAL FUNCTION PANEL
Albumin: 4.4 g/dL (ref 3.5–5.2)
BUN: 20 mg/dL (ref 6–23)
CO2: 30 mEq/L (ref 19–32)
Calcium: 10 mg/dL (ref 8.4–10.5)
Chloride: 100 mEq/L (ref 96–112)
Creat: 0.8 mg/dL (ref 0.50–1.35)
Glucose, Bld: 154 mg/dL — ABNORMAL HIGH (ref 70–99)
Phosphorus: 3.3 mg/dL (ref 2.3–4.6)
Potassium: 4.6 mEq/L (ref 3.5–5.3)
Sodium: 139 mEq/L (ref 135–145)

## 2013-04-29 LAB — CBC
HCT: 41.4 % (ref 39.0–52.0)
Hemoglobin: 14.3 g/dL (ref 13.0–17.0)
MCH: 31.6 pg (ref 26.0–34.0)
MCHC: 34.5 g/dL (ref 30.0–36.0)
MCV: 91.4 fL (ref 78.0–100.0)
Platelets: 270 10*3/uL (ref 150–400)
RBC: 4.53 MIL/uL (ref 4.22–5.81)
RDW: 13.8 % (ref 11.5–15.5)
WBC: 10.6 10*3/uL — ABNORMAL HIGH (ref 4.0–10.5)

## 2013-04-29 LAB — LIPID PANEL
Cholesterol: 183 mg/dL (ref 0–200)
HDL: 33 mg/dL — ABNORMAL LOW (ref >39)
LDL Cholesterol: 106 mg/dL — ABNORMAL HIGH (ref 0–99)
Total CHOL/HDL Ratio: 5.5 Ratio
Triglycerides: 222 mg/dL — ABNORMAL HIGH (ref <150)
VLDL: 44 mg/dL — ABNORMAL HIGH (ref 0–40)

## 2013-04-29 LAB — HEMOGLOBIN A1C
Hgb A1c MFr Bld: 7.2 % — ABNORMAL HIGH (ref <5.7)
Mean Plasma Glucose: 160 mg/dL — ABNORMAL HIGH (ref <117)

## 2013-04-29 LAB — HEPATIC FUNCTION PANEL
ALT: 26 U/L (ref 0–53)
AST: 27 U/L (ref 0–37)
Albumin: 4.4 g/dL (ref 3.5–5.2)
Alkaline Phosphatase: 86 U/L (ref 39–117)
Bilirubin, Direct: 0.1 mg/dL (ref 0.0–0.3)
Indirect Bilirubin: 0.2 mg/dL (ref 0.0–0.9)
Total Bilirubin: 0.3 mg/dL (ref 0.3–1.2)
Total Protein: 7.5 g/dL (ref 6.0–8.3)

## 2013-04-29 LAB — TSH: TSH: 3.939 u[IU]/mL (ref 0.350–4.500)

## 2013-04-29 LAB — URIC ACID: Uric Acid, Serum: 6.1 mg/dL (ref 4.0–7.8)

## 2013-04-29 MED ORDER — GABAPENTIN 100 MG PO CAPS: ORAL_CAPSULE | ORAL | Status: DC

## 2013-04-29 MED ORDER — TRAMADOL HCL 50 MG PO TABS: 50.0000 mg | ORAL_TABLET | Freq: Three times a day (TID) | ORAL | Status: DC | PRN

## 2013-04-29 NOTE — Patient Instructions (Signed)
Peripheral Neuropathy Peripheral neuropathy is a common disorder of your nerves resulting from damage. CAUSES  This disorder may be caused by a disease of the nerves or illness. Many neuropathies have well known causes such as:  Diabetes. This is one of the most common causes.   Uremia.   AIDS.   Nutritional deficiencies.   Other causes include mechanical pressures. These may be from:   Compression.   Injury.   Contusions or bruises.   Fracture or dislocated bones.   Pressure involving the nerves close to the surface. Nerves such as the ulnar, or radial can be injured by prolonged use of crutches.  Other injuries may come from:  Tumor.   Hemorrhage or bleeding into a nerve.   Exposure to cold or radiation.   Certain medicines or toxic substances (rare).   Vascular or collagen disorders such as:   Atherosclerosis.   Systemic lupus erythematosus.   Scleroderma.   Sarcoidosis.   Rheumatoid arthritis.   Polyarteritis nodosa.   A large number of cases are of unknown cause.  SYMPTOMS  Common problems include:  Weakness.   Numbness.   Abnormal sensations (paresthesia) such as:   Burning.   Tickling.   Pricking.   Tingling.   Pain in the arms, hands, legs and/or feet.  TREATMENT  Therapy for this disorder differs depending on the cause. It may vary from medical treatment with medications or physical therapy among others.   For example, therapy for this disorder caused by diabetes involves control of the diabetes.   In cases where a tumor or ruptured disc is the cause, therapy may involve surgery. This would be to remove the tumor or to repair the ruptured disc.   In entrapment or compression neuropathy, treatment may consist of splinting or surgical decompression of the ulnar or median nerves. A common example of entrapment neuropathy is carpal tunnel syndrome. This has become more common because of the increasing use of computers.   Peroneal and  radial compression neuropathies may require avoidance of pressure.   Physical therapy and/or splints may be useful in preventing contractures. This is a condition in which shortened muscles around joints cause abnormal and sometimes painful positioning of the joints.  Document Released: 10/12/2002 Document Revised: 07/04/2011 Document Reviewed: 10/22/2005 [employer] Patient Information 2012 [employer]. 

## 2013-04-29 NOTE — Progress Notes (Signed)
Quick Note:  Patient Informed and voiced understanding ______ 

## 2013-04-30 LAB — SEDIMENTATION RATE: Sed Rate: 15 mm/hr (ref 0–16)

## 2013-04-30 MED ORDER — ATORVASTATIN CALCIUM 80 MG PO TABS: 80.0000 mg | ORAL_TABLET | Freq: Every day | ORAL | Status: DC

## 2013-05-02 ENCOUNTER — Encounter: Payer: Self-pay | Admitting: Family Medicine

## 2013-05-02 NOTE — Assessment & Plan Note (Addendum)
hgba1c improving, encouraged minimize simple carbs, exercise as tolerated and continue current meds. Referred to podiatry

## 2013-05-02 NOTE — Assessment & Plan Note (Signed)
Well controlled, no changes 
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
## 2013-05-02 NOTE — Assessment & Plan Note (Signed)
Tolerating Atorvastatin. Avoid trans fats, minimize simple carbs and add a fatty acid supplement such as krill oil

## 2013-05-02 NOTE — Assessment & Plan Note (Signed)
Hip to foot. Encouraged topical treatments and maintain activity as tolerated. Given Tramadol to use prn for severe pain

## 2013-05-14 ENCOUNTER — Other Ambulatory Visit: Payer: Self-pay

## 2013-05-15 ENCOUNTER — Encounter: Payer: Self-pay | Admitting: Family Medicine

## 2013-05-27 ENCOUNTER — Encounter (INDEPENDENT_AMBULATORY_CARE_PROVIDER_SITE_OTHER): Payer: Medicare Other | Admitting: *Deleted

## 2013-05-27 DIAGNOSIS — R0989 Other specified symptoms and signs involving the circulatory and respiratory systems: Secondary | ICD-10-CM

## 2013-05-27 DIAGNOSIS — M79609 Pain in unspecified limb: Secondary | ICD-10-CM

## 2013-06-08 ENCOUNTER — Ambulatory Visit (INDEPENDENT_AMBULATORY_CARE_PROVIDER_SITE_OTHER): Payer: Medicare Other | Admitting: Family Medicine

## 2013-06-08 ENCOUNTER — Encounter: Payer: Self-pay | Admitting: Family Medicine

## 2013-06-08 VITALS — BP 118/72 | HR 78 | Temp 97.7°F | Ht 69.0 in | Wt 244.0 lb

## 2013-06-08 DIAGNOSIS — G609 Hereditary and idiopathic neuropathy, unspecified: Secondary | ICD-10-CM

## 2013-06-08 DIAGNOSIS — E1142 Type 2 diabetes mellitus with diabetic polyneuropathy: Secondary | ICD-10-CM

## 2013-06-08 DIAGNOSIS — M25559 Pain in unspecified hip: Secondary | ICD-10-CM

## 2013-06-08 DIAGNOSIS — E1149 Type 2 diabetes mellitus with other diabetic neurological complication: Secondary | ICD-10-CM

## 2013-06-08 DIAGNOSIS — M79605 Pain in left leg: Secondary | ICD-10-CM

## 2013-06-08 DIAGNOSIS — M79671 Pain in right foot: Secondary | ICD-10-CM

## 2013-06-08 DIAGNOSIS — M25552 Pain in left hip: Secondary | ICD-10-CM

## 2013-06-08 DIAGNOSIS — I10 Essential (primary) hypertension: Secondary | ICD-10-CM

## 2013-06-08 DIAGNOSIS — E785 Hyperlipidemia, unspecified: Secondary | ICD-10-CM

## 2013-06-08 DIAGNOSIS — M79609 Pain in unspecified limb: Secondary | ICD-10-CM

## 2013-06-08 DIAGNOSIS — E119 Type 2 diabetes mellitus without complications: Secondary | ICD-10-CM

## 2013-06-08 MED ORDER — TRAMADOL HCL 50 MG PO TABS: ORAL_TABLET | ORAL | Status: DC

## 2013-06-08 NOTE — Assessment & Plan Note (Signed)
Bursitis likely will follow up with ortho. Allowed Tramadol to increase to up to 100 mg tid for severe pain. Is now on Gabapentin 300 mg tid from ortho and may need to increase further. He is strongly encouraged to accept the steroid injection ortho offered him in past if pain persists

## 2013-06-08 NOTE — Patient Instructions (Addendum)
Salon Pas patch and/or cream  Labs at [facility] prior lipid, renal, cbc, tsh, hepatic, hgba1c   Bursitis Bursitis is a swelling and soreness (inflammation) of a fluid-filled sac (bursa) that overlies and protects a joint. It can be caused by injury, overuse of the joint, arthritis or infection. The joints most likely to be affected are the elbows, shoulders, hips and knees. HOME CARE INSTRUCTIONS   Apply ice to the affected area for 15-20 minutes each hour while awake for 2 days. Put the ice in a plastic bag and place a towel between the bag of ice and your skin.  Rest the injured joint as much as possible, but continue to put the joint through a full range of motion, 4 times per day. (The shoulder joint especially becomes rapidly "frozen" if not used.) When the pain lessens, begin normal slow movements and usual activities.  Only take over-the-counter or prescription medicines for pain, discomfort or fever as directed by your caregiver.  Your caregiver may recommend draining the bursa and injecting medicine into the bursa. This may help the healing process.  Follow all instructions for follow-up with your caregiver. This includes any orthopedic referrals, physical therapy and rehabilitation. Any delay in obtaining necessary care could result in a delay or failure of the bursitis to heal and chronic pain. SEEK IMMEDIATE MEDICAL CARE IF:   Your pain increases even during treatment.  You develop an oral temperature above 102 F (38.9 C) and have heat and inflammation over the involved bursa. MAKE SURE YOU:   Understand these instructions.  Will watch your condition.  Will get help right away if you are not doing well or get worse. Document Released: 10/19/2000 Document Revised: 01/14/2012 Document Reviewed: 09/23/2009 [facility] Patient Information 2014 [place], [place].

## 2013-06-08 NOTE — Assessment & Plan Note (Signed)
Improved hgba1c at last visit. Avoid simple carbs, no change in meds
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
## 2013-06-08 NOTE — Assessment & Plan Note (Signed)
Continue meds recheck in 6-8 weeks. No myalgias, no CoQ10, avoid trans fats

## 2013-06-08 NOTE — Assessment & Plan Note (Signed)
Well controlled on current meds. No changes

## 2013-06-15 ENCOUNTER — Other Ambulatory Visit: Payer: Self-pay | Admitting: Family Medicine

## 2013-07-12 ENCOUNTER — Other Ambulatory Visit: Payer: Self-pay | Admitting: Family Medicine

## 2013-07-23 ENCOUNTER — Telehealth: Payer: Self-pay | Admitting: Family Medicine

## 2013-07-23 DIAGNOSIS — E1149 Type 2 diabetes mellitus with other diabetic neurological complication: Secondary | ICD-10-CM

## 2013-07-23 DIAGNOSIS — E785 Hyperlipidemia, unspecified: Secondary | ICD-10-CM

## 2013-07-23 DIAGNOSIS — I10 Essential (primary) hypertension: Secondary | ICD-10-CM

## 2013-07-23 NOTE — Telephone Encounter (Signed)
Labs at [facility] prior lipid, renal, cbc, tsh, hepatic, hgba1c   Patient has appointment 08/14/13 and will be going to [facility] lab

## 2013-08-11 ENCOUNTER — Ambulatory Visit (INDEPENDENT_AMBULATORY_CARE_PROVIDER_SITE_OTHER): Payer: Medicare Other

## 2013-08-11 VITALS — BP 144/74 | HR 74 | Resp 12 | Ht 69.0 in | Wt 244.0 lb

## 2013-08-11 DIAGNOSIS — Z79899 Other long term (current) drug therapy: Secondary | ICD-10-CM

## 2013-08-11 DIAGNOSIS — G988 Other disorders of nervous system: Secondary | ICD-10-CM

## 2013-08-11 DIAGNOSIS — E1149 Type 2 diabetes mellitus with other diabetic neurological complication: Secondary | ICD-10-CM

## 2013-08-11 NOTE — Patient Instructions (Signed)
Diabetes and Foot Care Diabetes may cause you to have a poor blood supply (circulation) to your legs and feet. Because of this, the skin may be thinner, break easier, and heal more slowly. You also may have nerve damage in your legs and feet causing decreased feeling. You may not notice minor injuries to your feet that could lead to serious problems or infections. Taking care of your feet is one of the most important things you can do for yourself.  HOME CARE INSTRUCTIONS  Do not go barefoot. Bare feet are easily injured.  Check your feet daily for blisters, cuts, and redness.  Wash your feet with warm water (not hot) and mild soap. Pat your feet and between your toes until completely dry.  Apply a moisturizing lotion that does not contain alcohol or petroleum jelly to the dry skin on your feet and to dry brittle toenails. Do not put it between your toes.  Trim your toenails straight across. Do not dig under them or around the cuticle.  Do not cut corns or calluses, or try to remove them with medicine.  Wear clean cotton socks or stockings every day. Make sure they are not too tight. Do not wear knee high stockings since they may decrease blood flow to your legs.  Wear leather shoes that fit properly and have enough cushioning. To break in new shoes, wear them just a few hours a day to avoid injuring your feet.  Wear shoes at all times, even in the house.  Do not cross your legs. This may decrease the blood flow to your feet.  If you find a minor scrape, cut, or break in the skin on your feet, keep it and the skin around it clean and dry. These areas may be cleansed with mild soap and water. Do not use peroxide, alcohol, iodine or Merthiolate.  When you remove an adhesive bandage, be sure not to harm the skin around it.  If you have a wound, look at it several times a day to make sure it is healing.  Do not use heating pads or hot water bottles. Burns can occur. If you have lost feeling  in your feet or legs, you may not know it is happening until it is too late.  Report any cuts, sores or bruises to your caregiver. Do not wait! SEEK MEDICAL CARE IF:   You have an injury that is not healing or you notice redness, numbness, burning, or tingling.  Your feet always feel cold.  You have pain or cramps in your legs and feet. SEEK IMMEDIATE MEDICAL CARE IF:   There is increasing redness, swelling, or increasing pain in the wound.  There is a red line that goes up your leg.  Pus is coming from a wound.  You develop an unexplained oral temperature above 102 F (38.9 C), or as your caregiver suggests.  You notice a bad smell coming from an ulcer or wound. MAKE SURE YOU:   Understand these instructions.  Will watch your condition.  Will get help right away if you are not doing well or get worse. Document Released: 10/19/2000 Document Revised: 01/14/2012 Document Reviewed: 04/27/2009 [facility] Patient Information 2014 [place], [place].    Diabetic Neuropathy Diabetic neuropathy is a common complication caused by diabetes. Neuropathy is a term that means nerve disease or damage. If your diabetes is uncontrolled and you have high blood glucose (sugar) levels, over time, this can lead to damage to nerves throughout your body. There are  three types of diabetic neuropathy:   Peripheral.  Autonomic.  Focal. PERIPHERAL NEUROPATHY Peripheral neuropathy is the most common form of diabetic neuropathy. It causes damage to the nerves of the feet and legs and eventually the hands and arms.  SYMPTOMS  Peripheral neuropathy occurs slowly over time. The peripheral nerves sense touch, hot and cold, and pain. When these nerves no longer work:   Your feet become numb.  You can no longer feel pressure or pain in your feet.  You may have burning, stabbing or aching pain. This can lead to:  Thick calluses over pressure areas.  Pressure sores.  Ulcers. Ulcers can become  infected with germs (bacteria) and can even lead to infection in the bones of the feet. DIAGNOSIS  The diagnosis of diabetic neuropathy is difficult at best. Sensory function testing can be done with:  Light touch using a monofilament.  Vibration with tuning fork.  Sharp sensation with pin prick Other tests that can help diagnose neuropathy are:  Nerve Conduction Velocities (NCV). This checks the transmission of electrical current through a nerve.  Electromyography (EMG). This shows how muscles respond to electrical signals transmitted by nearby nerves.  Quantitative sensory testing, which is used to assess how your nerves respond to vibration and changes in temperature. AUTONOMIC NEUROPATHY The autonomic nervous system controls functions that you do not think about. Examples would be:   Heart beat.  Regulation of body temperature.  Blood pressure.  Urination.  Digestion.  Sweating.  Sexual function. SYMPTOMS  The symptoms of autonomic neuropathy vary depending on which nerves are affected.   There can be problems with digestion such as:  Feeling sick to your stomach (nausea).  Vomiting.  Bloating.  Constipation.  Diarrhea.  Abdominal pain.  Difficulty with urination may occur because of the inability to sense when your bladder is full. You may have urine leakage (incontinence) or inability to empty your bladder completely (retention).  Palpitations or a feeling of an abnormal heart beat.  Blood pressure drops on arising (orthostatic hypotension). This can happen when you first sit up or stand up. It causes you to feel:  Dizzy.  Weak.  Faint.  Sexual functioning:  In men, inability to attain and maintain an erection.  In women, vaginal dryness and problems with decreased sexual desire and arousal. DIAGNOSIS  Diagnosis is often based on reported symptoms. Tell your medical caregiver if you experience:    Dizziness.  Constipation.  Diarrhea.  Inappropriate urination or inability to urinate.  Inability to get or maintain an erection. Tests that may be done include:  An EKG or Holter Monitor. These are tests that can help show problems with the heart rate or heart rhythm.  X-rays can be used to find if there are problems with your ability to properly empty food from your stomach into the small intestine after eating. FOCAL NEUROPATHY Focal neuropathy affects just one nerve tract and occurs suddenly. However, it usually improves by itself over time. It does not cause long term damage, and treatments are usually needed only until the problem improves. SYMPTOMS  Examples include:   Abnormal eye movements or abnormal alignment of both eyes.  Weakness in the wrist.  Foot drop, which results in inability to lift the foot properly. This causes abnormal walking or foot movement. DIAGNOSIS  Diagnosis is made based on your symptoms and what your caregiver finds on your exam. Other tests that may be done include:  Nerve Conduction Velocities (NCV). This checks the transmission of  electrical current through a nerve.  Electromyography (EMG). This shows how muscles respond to electrical signals transmitted by nearby nerves.  Quantitative sensory testing, which is used to assess how your nerves respond to vibration and changes in temperature. TREATMENT Once nerve damage occurs it cannot be reversed. The goal of treatment is to keep the disease from getting worse. If it gets worse, it will affect more nerve fibers. Controlling your blood (sugar) is the key. You will need to keep your blood glucose and A1c at the target range prescribed by your caregiver. Things that will help control blood glucose levels include:  Blood glucose monitoring.  Meal planning.  Physical activity.  Diabetes medication. Over time, maintaining lower blood glucose levels helps lessen symptoms. Sometimes,  prescription pain medicine is needed. Focal neuropathy can be painful and unpredictable and occurs most often in older adults with diabetes.  SEEK MEDICAL CARE IF:   You develop peripheral nerve symptoms such as burning, numbness, or pain in your feet, legs or hands.  You develop autonomic nerve symptoms such as:  Dizziness.  Abnormal urinary control.  Inability to get an erection.  You develop focal nerve symptoms such as sudden abnormal eye movements or sudden foot drop. Document Released: 12/31/2001 Document Revised: 01/14/2012 Document Reviewed: 04/01/2009 [facility] Patient Information 2014 [place], [place].
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
## 2013-08-12 ENCOUNTER — Ambulatory Visit: Payer: Medicare Other | Admitting: Family Medicine

## 2013-08-13 ENCOUNTER — Other Ambulatory Visit (INDEPENDENT_AMBULATORY_CARE_PROVIDER_SITE_OTHER): Payer: Medicare Other

## 2013-08-13 DIAGNOSIS — E1142 Type 2 diabetes mellitus with diabetic polyneuropathy: Secondary | ICD-10-CM

## 2013-08-13 DIAGNOSIS — E1149 Type 2 diabetes mellitus with other diabetic neurological complication: Secondary | ICD-10-CM

## 2013-08-13 DIAGNOSIS — E785 Hyperlipidemia, unspecified: Secondary | ICD-10-CM

## 2013-08-13 DIAGNOSIS — I10 Essential (primary) hypertension: Secondary | ICD-10-CM

## 2013-08-13 LAB — RENAL FUNCTION PANEL
Albumin: 4.1 g/dL (ref 3.5–5.2)
BUN: 22 mg/dL (ref 6–23)
CO2: 29 mEq/L (ref 19–32)
Calcium: 9.5 mg/dL (ref 8.4–10.5)
Chloride: 99 mEq/L (ref 96–112)
Creatinine, Ser: 1.6 mg/dL — ABNORMAL HIGH (ref 0.4–1.5)
GFR: 47.56 mL/min — ABNORMAL LOW (ref >60.00)
Glucose, Bld: 199 mg/dL — ABNORMAL HIGH (ref 70–99)
Phosphorus: 4.4 mg/dL (ref 2.3–4.6)
Potassium: 4.2 mEq/L (ref 3.5–5.1)
Sodium: 140 mEq/L (ref 135–145)

## 2013-08-13 LAB — CBC
HCT: 38.1 % — ABNORMAL LOW (ref 39.0–52.0)
Hemoglobin: 12.8 g/dL — ABNORMAL LOW (ref 13.0–17.0)
MCHC: 33.6 g/dL (ref 30.0–36.0)
MCV: 94.3 fl (ref 78.0–100.0)
Platelets: 233 10*3/uL (ref 150.0–400.0)
RBC: 4.04 Mil/uL — ABNORMAL LOW (ref 4.22–5.81)
RDW: 13.6 % (ref 11.5–14.6)
WBC: 9.2 10*3/uL (ref 4.5–10.5)

## 2013-08-13 LAB — LIPID PANEL
Cholesterol: 124 mg/dL (ref 0–200)
HDL: 30.9 mg/dL — ABNORMAL LOW (ref >39.00)
LDL Cholesterol: 60 mg/dL (ref 0–99)
Total CHOL/HDL Ratio: 4
Triglycerides: 165 mg/dL — ABNORMAL HIGH (ref 0.0–149.0)
VLDL: 33 mg/dL (ref 0.0–40.0)

## 2013-08-13 LAB — HEPATIC FUNCTION PANEL
ALT: 19 U/L (ref 0–53)
AST: 23 U/L (ref 0–37)
Albumin: 4.1 g/dL (ref 3.5–5.2)
Alkaline Phosphatase: 73 U/L (ref 39–117)
Bilirubin, Direct: 0.1 mg/dL (ref 0.0–0.3)
Total Bilirubin: 0.7 mg/dL (ref 0.3–1.2)
Total Protein: 7.3 g/dL (ref 6.0–8.3)

## 2013-08-13 LAB — HEMOGLOBIN A1C: Hgb A1c MFr Bld: 7.4 % — ABNORMAL HIGH (ref 4.6–6.5)

## 2013-08-13 LAB — TSH: TSH: 2.72 u[IU]/mL (ref 0.35–5.50)

## 2013-08-14 ENCOUNTER — Ambulatory Visit (INDEPENDENT_AMBULATORY_CARE_PROVIDER_SITE_OTHER): Payer: Medicare Other | Admitting: Family Medicine

## 2013-08-14 ENCOUNTER — Encounter: Payer: Self-pay | Admitting: Family Medicine

## 2013-08-14 VITALS — BP 142/70 | HR 89 | Temp 97.8°F | Ht 69.0 in | Wt 245.0 lb

## 2013-08-14 DIAGNOSIS — I10 Essential (primary) hypertension: Secondary | ICD-10-CM

## 2013-08-14 DIAGNOSIS — D649 Anemia, unspecified: Secondary | ICD-10-CM

## 2013-08-14 DIAGNOSIS — N289 Disorder of kidney and ureter, unspecified: Secondary | ICD-10-CM

## 2013-08-14 DIAGNOSIS — I2510 Atherosclerotic heart disease of native coronary artery without angina pectoris: Secondary | ICD-10-CM

## 2013-08-14 DIAGNOSIS — E785 Hyperlipidemia, unspecified: Secondary | ICD-10-CM

## 2013-08-14 DIAGNOSIS — E1149 Type 2 diabetes mellitus with other diabetic neurological complication: Secondary | ICD-10-CM

## 2013-08-14 DIAGNOSIS — E1142 Type 2 diabetes mellitus with diabetic polyneuropathy: Secondary | ICD-10-CM

## 2013-08-14 HISTORY — DX: Anemia, unspecified: D64.9

## 2013-08-14 HISTORY — DX: Disorder of kidney and ureter, unspecified: N28.9

## 2013-08-14 MED ORDER — CARVEDILOL 25 MG PO TABS: 25.0000 mg | ORAL_TABLET | Freq: Two times a day (BID) | ORAL | Status: DC

## 2013-08-14 MED ORDER — LOSARTAN POTASSIUM 50 MG PO TABS: 50.0000 mg | ORAL_TABLET | Freq: Every day | ORAL | Status: DC

## 2013-08-14 NOTE — Assessment & Plan Note (Signed)
Foot exam today with peripheral neuropathy, hgba1c up to 7.4, minimize simple carbs, one per meal.

## 2013-08-14 NOTE — Assessment & Plan Note (Addendum)
Sent home with IFOB, encouraged increased leafy greens and continue vitamin B complex. Repeat CBC and further studies next month

## 2013-08-14 NOTE — Patient Instructions (Signed)
Leafy greens 64 oz of clear fluids Watch the carbs  Basic Carbohydrate Counting Basic carbohydrate counting is a way to plan meals. It is done by counting the amount of carbohydrate in foods. Foods that have carbohydrates are starches (grains, beans, starchy vegetables) and sweets. Eating carbohydrates increases blood glucose (sugar) levels. People with diabetes use carbohydrate counting to help keep their blood glucose at a normal level.  COUNTING CARBOHYDRATES IN FOODS The first step in counting carbohydrates is to learn how many carbohydrate servings you should have in every meal. A dietitian can plan this for you. After learning the amount of carbohydrates to include in your meal plan, you can start to choose the carbohydrate-containing foods you want to eat.  There are 2 ways to identify the amount of carbohydrates in the foods you eat.  Read the Nutrition Facts panel on food labels. You need 2 pieces of information from the Nutrition Facts panel to count carbohydrates this way:  Serving size.  Total carbohydrate (in grams). Decide how many servings you will be eating. If it is 1 serving, you will be eating the amount of carbohydrate listed on the panel. If you will be eating 2 servings, you will be eating double the amount of carbohydrate listed on the panel.   Learn serving sizes. A serving size of most carbohydrate-containing foods is about 15 grams (g). Listed below are single serving sizes of common carbohydrate-containing foods:  1 slice bread.   cup unsweetened, dry cereal.   cup hot cereal.   cup rice.   cup mashed potatoes.   cup pasta.  1 cup fresh fruit.   cup canned fruit.  1 cup milk (whole, 2%, or skim).   cup starchy vegetables (peas, corn, or potatoes). Counting carbohydrates this way is similar to looking on the Nutrition Facts panel. Decide how many servings you will eat first. Multiply the number of servings you eat by 15 g. For example, if you have  2 cups of strawberries, you had 2 servings. That means you had 30 g of carbohydrate (2 servings x 15 g = 30 g). CALCULATING CARBOHYDRATES IN A MEAL Sample dinner  3 oz chicken breast.   cup brown rice.   cup corn.  1 cup fat-free milk.  1 cup strawberries with sugar-free whipped topping. Carbohydrate calculation First, identify the foods that contain carbohydrate:  Rice.  Corn.  Milk.  Strawberries. Calculate the number of servings eaten:  2 servings rice.  1 serving corn.  1 serving milk.  1 serving strawberries. Multiply the number of servings by 15 g:  2 servings rice x 15 g = 30 g.  1 serving corn x 15 g = 15 g.  1 serving milk x 15 g = 15 g.  1 serving strawberries x 15 g = 15 g. Add the amounts to find the total carbohydrates eaten: 30 g + 15 g + 15 g + 15 g = 75 g carbohydrate eaten at dinner. Document Released: 10/22/2005 Document Revised: 01/14/2012 Document Reviewed: 09/07/2011 [facility] Patient Information 2014 [place], [place].

## 2013-08-14 NOTE — Assessment & Plan Note (Signed)
Tolerating lipitor, avoid trans fats

## 2013-08-14 NOTE — Assessment & Plan Note (Signed)
No recent episodes, asymptomatic

## 2013-08-14 NOTE — Assessment & Plan Note (Signed)
Adequately controlled but some renal insufficiency noted. Will decrease Losartan to 50 mg po daily and increase Coreg to 25 mg po bid.

## 2013-08-14 NOTE — Assessment & Plan Note (Signed)
Encouraged improved hydration and drop Losartan to 50 mg check renal next month
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
## 2013-08-19 ENCOUNTER — Other Ambulatory Visit: Payer: Self-pay | Admitting: Family Medicine

## 2013-08-19 NOTE — Telephone Encounter (Signed)
Rx request to pharmacy/[facility]  

## 2013-08-25 ENCOUNTER — Telehealth: Payer: Self-pay | Admitting: *Deleted

## 2013-08-25 NOTE — Telephone Encounter (Signed)
Pt needs appointment to be measured for diabetic shoes.[name] KitchenMarland KitchenTuesday AM! Thanks!

## 2013-08-25 NOTE — Telephone Encounter (Signed)
Pt's calling to check status of diabetic shoe molding appt.

## 2013-09-01 ENCOUNTER — Ambulatory Visit (INDEPENDENT_AMBULATORY_CARE_PROVIDER_SITE_OTHER): Payer: Medicare Other | Admitting: *Deleted

## 2013-09-01 DIAGNOSIS — E1149 Type 2 diabetes mellitus with other diabetic neurological complication: Secondary | ICD-10-CM

## 2013-09-01 NOTE — Progress Notes (Signed)
Measured for diabetic shoes and insoles. 

## 2013-09-01 NOTE — Patient Instructions (Signed)
Our office will notify you once your diabetic shoes and insoles arrive. At that time an appointment will be needed to pick them up.  

## 2013-09-05 ENCOUNTER — Other Ambulatory Visit: Payer: Self-pay | Admitting: Family Medicine

## 2013-09-05 NOTE — Telephone Encounter (Signed)
Please advise Tramadol Refill? Last RX was done on 06-08-13 quantity 100 with 1 refill.  If ok fax to [phone]

## 2013-09-07 MED ORDER — TRAMADOL HCL 50 MG PO TABS: ORAL_TABLET | ORAL | Status: DC

## 2013-09-07 NOTE — Addendum Note (Signed)
Addended by: Court Joy on: 09/07/2013 10:08 AM   Modules accepted: Orders

## 2013-09-07 NOTE — Telephone Encounter (Signed)
Not sure were the printed RX went that MD printed on 09-05-13.  Will reprint RX and fax

## 2013-09-09 ENCOUNTER — Other Ambulatory Visit: Payer: Self-pay | Admitting: Family Medicine

## 2013-09-11 ENCOUNTER — Ambulatory Visit: Payer: Medicare Other

## 2013-09-12 ENCOUNTER — Other Ambulatory Visit: Payer: Self-pay | Admitting: Family Medicine

## 2013-09-14 ENCOUNTER — Other Ambulatory Visit (INDEPENDENT_AMBULATORY_CARE_PROVIDER_SITE_OTHER): Payer: Medicare Other

## 2013-09-14 ENCOUNTER — Telehealth: Payer: Self-pay | Admitting: *Deleted

## 2013-09-14 DIAGNOSIS — Z79899 Other long term (current) drug therapy: Secondary | ICD-10-CM

## 2013-09-14 DIAGNOSIS — D649 Anemia, unspecified: Secondary | ICD-10-CM

## 2013-09-14 DIAGNOSIS — E538 Deficiency of other specified B group vitamins: Secondary | ICD-10-CM

## 2013-09-14 LAB — BASIC METABOLIC PANEL
BUN: 23 mg/dL (ref 6–23)
CO2: 29 mEq/L (ref 19–32)
Calcium: 9.4 mg/dL (ref 8.4–10.5)
Chloride: 100 mEq/L (ref 96–112)
Creatinine, Ser: 1.3 mg/dL (ref 0.4–1.5)
GFR: 56.67 mL/min — ABNORMAL LOW (ref >60.00)
Glucose, Bld: 131 mg/dL — ABNORMAL HIGH (ref 70–99)
Potassium: 3.9 mEq/L (ref 3.5–5.1)
Sodium: 139 mEq/L (ref 135–145)

## 2013-09-14 LAB — CBC WITH DIFFERENTIAL/PLATELET
Basophils Absolute: 0 10*3/uL (ref 0.0–0.1)
Basophils Relative: 0.3 % (ref 0.0–3.0)
Eosinophils Absolute: 0.2 10*3/uL (ref 0.0–0.7)
Eosinophils Relative: 1.8 % (ref 0.0–5.0)
HCT: 40 % (ref 39.0–52.0)
Hemoglobin: 13.5 g/dL (ref 13.0–17.0)
Lymphocytes Relative: 32.4 % (ref 12.0–46.0)
Lymphs Abs: 3.7 10*3/uL (ref 0.7–4.0)
MCHC: 33.7 g/dL (ref 30.0–36.0)
MCV: 92.6 fl (ref 78.0–100.0)
Monocytes Absolute: 0.8 10*3/uL (ref 0.1–1.0)
Monocytes Relative: 6.6 % (ref 3.0–12.0)
Neutro Abs: 6.8 10*3/uL (ref 1.4–7.7)
Neutrophils Relative %: 58.9 % (ref 43.0–77.0)
Platelets: 233 10*3/uL (ref 150.0–400.0)
RBC: 4.32 Mil/uL (ref 4.22–5.81)
RDW: 13.6 % (ref 11.5–14.6)
WBC: 11.5 10*3/uL — ABNORMAL HIGH (ref 4.5–10.5)

## 2013-09-14 LAB — VITAMIN B12: Vitamin B-12: 1150 pg/mL — ABNORMAL HIGH (ref 211–911)

## 2013-09-14 NOTE — Telephone Encounter (Signed)
Lab orders placed from 11.07.14 canceled HP appt; provider informed/SLS

## 2013-09-14 NOTE — Telephone Encounter (Signed)
Received refill request from pharmacy for tramadol. Spoke with Shawna Orleans at [facility] and verified they received 09/07/13 rx and we can disregard current request. Denial sent.

## 2013-09-14 NOTE — Telephone Encounter (Signed)
Needs CBC and renal for CRI and anemia

## 2013-09-14 NOTE — Telephone Encounter (Signed)
[place] from [facility] called stating that pt was there, but there are no orders in system; last note states "CBC and further studies next month"/SLS Please Advise.

## 2013-09-17 LAB — INTRINSIC FACTOR BLOCKING ANTIBODY: Intrinsic Factor: NEGATIVE

## 2013-09-18 ENCOUNTER — Telehealth: Payer: Self-pay | Admitting: *Deleted

## 2013-09-18 ENCOUNTER — Ambulatory Visit (INDEPENDENT_AMBULATORY_CARE_PROVIDER_SITE_OTHER): Payer: Medicare Other | Admitting: Family Medicine

## 2013-09-18 VITALS — BP 100/62 | HR 80 | Resp 16 | Wt 240.1 lb

## 2013-09-18 DIAGNOSIS — I10 Essential (primary) hypertension: Secondary | ICD-10-CM

## 2013-09-18 MED ORDER — BUDESONIDE-FORMOTEROL FUMARATE 160-4.5 MCG/ACT IN AERO: 2.0000 | INHALATION_SPRAY | Freq: Two times a day (BID) | RESPIRATORY_TRACT | Status: DC | PRN

## 2013-09-18 NOTE — Telephone Encounter (Signed)
Pt requests refill of gabapentin. He states he takes 3 capsules twice a day.  Current med list still has titration directions. Is it ok to send refill for 3 capsules twice a day?

## 2013-09-18 NOTE — Telephone Encounter (Signed)
His sig is actually 3 caps po qhs, so if he is taking more than that, I am willing to fill for one one month but he needs to come in to discuss response in next month before meds run out. He can have Neurontin 300 mg caps 1 cap po bid, # 60 which will replace so many 100 mg caps

## 2013-09-18 NOTE — Telephone Encounter (Signed)
Spoke with pt. He states Dr Andria Rhein? With [facility] advised him to increase gabapentin to 300mg  three times a day, not twice a day as pt originally told me. Advised pt to call his foot doctor for the refill and he voices understanding.

## 2013-09-21 ENCOUNTER — Telehealth: Payer: Self-pay | Admitting: *Deleted

## 2013-09-21 NOTE — Telephone Encounter (Signed)
Pt states was given an appt for 10/09/2013 to pick up diabetic shoes.  Pt complains of painful feet and would like to be seen sooner to pick up shoes.  Referred to [facility].

## 2013-09-22 NOTE — Progress Notes (Signed)
Patient in for bp check with nursing

## 2013-10-06 ENCOUNTER — Ambulatory Visit (INDEPENDENT_AMBULATORY_CARE_PROVIDER_SITE_OTHER): Payer: Medicare Other

## 2013-10-06 VITALS — BP 152/86 | HR 76 | Resp 20 | Ht 69.0 in | Wt 240.0 lb

## 2013-10-06 DIAGNOSIS — G988 Other disorders of nervous system: Secondary | ICD-10-CM

## 2013-10-06 DIAGNOSIS — M2040 Other hammer toe(s) (acquired), unspecified foot: Secondary | ICD-10-CM

## 2013-10-06 DIAGNOSIS — Z79899 Other long term (current) drug therapy: Secondary | ICD-10-CM

## 2013-10-06 DIAGNOSIS — E1149 Type 2 diabetes mellitus with other diabetic neurological complication: Secondary | ICD-10-CM

## 2013-10-06 MED ORDER — TRAMADOL HCL 50 MG PO TABS: ORAL_TABLET | ORAL | Status: DC

## 2013-10-06 MED ORDER — GABAPENTIN 300 MG PO CAPS: 300.0000 mg | ORAL_CAPSULE | Freq: Three times a day (TID) | ORAL | Status: DC

## 2013-10-06 NOTE — Progress Notes (Signed)
   Subjective:    Patient ID: [name], male    DOB: 05-05-1946, 67 y.o.   MRN: [id number]  "I'm here to pick up my shoes.  I need refills on the Gabapentin and Tramadol."  New Balance [id number] size 10 xwide  HPI    Review of Systems no changes or new addition     Objective:   Physical Exam Neurovascular status is intact and unchanged decreased epicritic sensation confirmed on [facility] testing. Patient does have digital contractures with deformity as well as diabetes with neuropathy. Dispensed 1 pair shoes and 3 pairs of custom insoles with Plastizote and dual density insoles. The insoles fit and contour well to the patient's foot with full contact. The shoes to accommodate the foot deformities with digital contractures HAV deformity.     Assessment & Plan:  Assessment diabetes with complicating factors including neuropathy and digital deformities. Plan at this time dispensed shoes and custom insoles or and written instructions for shoe use and breaking are dispensed the patient patient is advised on changing the insoles after proxy four-month views. Recheck in 2-3 months for shoe check and diabetic reevaluation. Patient has opted gabapentin to 300 mg 3 times daily as instructed and had significant improvement. Needs refills on his medications at this time we'll switch over to gabapentin 3 mg tablets 13 times daily also tramadol he is on using water daily as needed for pain prescriptions are issued and signed patient will followup in 3 months for  Alvan Dame DPM

## 2013-10-06 NOTE — Patient Instructions (Signed)
Diabetes and Foot Care Diabetes may cause you to have problems because of poor blood supply (circulation) to your feet and legs. This may cause the skin on your feet to become thinner, break easier, and heal more slowly. Your skin may become dry, and the skin may peel and crack. You may also have nerve damage in your legs and feet causing decreased feeling in them. You may not notice minor injuries to your feet that could lead to infections or more serious problems. Taking care of your feet is one of the most important things you can do for yourself.  HOME CARE INSTRUCTIONS  Wear shoes at all times, even in the house. Do not go barefoot. Bare feet are easily injured.  Check your feet daily for blisters, cuts, and redness. If you cannot see the bottom of your feet, use a mirror or ask someone for help.  Wash your feet with warm water (do not use hot water) and mild soap. Then pat your feet and the areas between your toes until they are completely dry. Do not soak your feet as this can dry your skin.  Apply a moisturizing lotion or petroleum jelly (that does not contain alcohol and is unscented) to the skin on your feet and to dry, brittle toenails. Do not apply lotion between your toes.  Trim your toenails straight across. Do not dig under them or around the cuticle. File the edges of your nails with an emery board or nail file.  Do not cut corns or calluses or try to remove them with medicine.  Wear clean socks or stockings every day. Make sure they are not too tight. Do not wear knee-high stockings since they may decrease blood flow to your legs.  Wear shoes that fit properly and have enough cushioning. To break in new shoes, wear them for just a few hours a day. This prevents you from injuring your feet. Always look in your shoes before you put them on to be sure there are no objects inside.  Do not cross your legs. This may decrease the blood flow to your feet.  If you find a minor scrape,  cut, or break in the skin on your feet, keep it and the skin around it clean and dry. These areas may be cleansed with mild soap and water. Do not cleanse the area with peroxide, alcohol, or iodine.  When you remove an adhesive bandage, be sure not to damage the skin around it.  If you have a wound, look at it several times a day to make sure it is healing.  Do not use heating pads or hot water bottles. They may burn your skin. If you have lost feeling in your feet or legs, you may not know it is happening until it is too late.  Make sure your health care provider performs a complete foot exam at least annually or more often if you have foot problems. Report any cuts, sores, or bruises to your health care provider immediately. SEEK MEDICAL CARE IF:   You have an injury that is not healing.  You have cuts or breaks in the skin.  You have an ingrown nail.  You notice redness on your legs or feet.  You feel burning or tingling in your legs or feet.  You have pain or cramps in your legs and feet.  Your legs or feet are numb.  Your feet always feel cold. SEEK IMMEDIATE MEDICAL CARE IF:   There is increasing redness,   swelling, or pain in or around a wound.  There is a red line that goes up your leg.  Pus is coming from a wound.  You develop a fever or as directed by your health care provider.  You notice a bad smell coming from an ulcer or wound. Document Released: 10/19/2000 Document Revised: 06/24/2013 Document Reviewed: 03/31/2013 Exit[employer] Patient Information 2014 [employer].  

## 2013-10-09 ENCOUNTER — Ambulatory Visit: Payer: Medicare Other

## 2013-10-14 ENCOUNTER — Telehealth: Payer: Self-pay | Admitting: *Deleted

## 2013-10-14 NOTE — Telephone Encounter (Addendum)
Pt wife states that pt received his diabetic shoes 10/06/2013, and they hurt his feet.  What can he do?  I asked pt if he began wearing the shoes gradually as instructed on our Diabetic Shoe Weariing Instruction form.  Pt states he did over do it one day, but the shoes hurt his little toe.  I told him to stop wearing the shoes, and I would have the diabetic shoe expert call.  Pt agreed.

## 2013-10-17 ENCOUNTER — Other Ambulatory Visit: Payer: Self-pay | Admitting: Family Medicine

## 2013-10-19 NOTE — Telephone Encounter (Signed)
Glimepiride, Hydrochlorothiazide and Metformin refilled per protocol. JG//CMA

## 2013-10-21 ENCOUNTER — Ambulatory Visit (INDEPENDENT_AMBULATORY_CARE_PROVIDER_SITE_OTHER): Payer: Medicare Other

## 2013-10-21 ENCOUNTER — Telehealth: Payer: Self-pay | Admitting: *Deleted

## 2013-10-21 VITALS — BP 153/81 | HR 79 | Resp 20 | Ht 69.0 in | Wt 240.0 lb

## 2013-10-21 DIAGNOSIS — M2040 Other hammer toe(s) (acquired), unspecified foot: Secondary | ICD-10-CM

## 2013-10-21 DIAGNOSIS — G988 Other disorders of nervous system: Secondary | ICD-10-CM

## 2013-10-21 DIAGNOSIS — E1149 Type 2 diabetes mellitus with other diabetic neurological complication: Secondary | ICD-10-CM

## 2013-10-21 NOTE — Progress Notes (Signed)
   Subjective:    Patient ID: [name], male    DOB: 05/05/1946, 67 y.o.   MRN: [id number]  "This shoe is rubbing my little toe on my left foot.  I was told to bring them back."  HPI    Review of Systems no changes or new finding     Objective:   Physical Exam Neurovascular status is intact and unchanged patient is wearing his diabetic shoes are causing some irritation to the dorsolateral aspect fifth digit left foot to patient does have a history of arthropathy that toes and had an injury in the past however the scene of the shoe is rubbing against the dorsolateral aspect of the toe although there is adequate space for the nails of this one areas causing irritation. At this time she was taken in heated in spot stretched appropriate to create a pocket for the fifth toe this lead to problems immediately. Patient is also dispensed and tubercle padding to help cushion the toe temporarily.  Remainder of exam is otherwise unremarkable. Pulses palpable epicritic and proprioceptive sensations intact although diminished patient does have diabetes cocking factors thick brittle crumbly criptotic nails for which patient been doing self-care in the past may be candidate for future palliative and nail care debridement on an as-needed basis. No open wounds or ulcers are noted at this time followup as needed      Assessment & Plan:  Assessment diabetes with neuropathy. Digital contracture hammertoe deformity with associated keratoses. The diabetic shoes are adjusted with heating in spot stretching the toe box of the left tissue. Patient will continue to be monitored and recheck in the future and as-needed basis for initial adjustments if needed for possible future palliative care for nails  Alvan Dame DPM

## 2013-10-21 NOTE — Telephone Encounter (Signed)
Received message from pt's wife requesting refill on pt's ?simvastatin 40mg . Upon review of med list, pt should be taking atrovastatin 80mg .  Left detailed message on pt's voicemail to call us back and verify request. Also advised pt that if he is taking atorvastatin, he should still have 1 refill at his pharmacy as last rx sent 08/19/13, #30 x 2 refills.

## 2013-10-21 NOTE — Patient Instructions (Signed)
Diabetes and Foot Care Diabetes may cause you to have problems because of poor blood supply (circulation) to your feet and legs. This may cause the skin on your feet to become thinner, break easier, and heal more slowly. Your skin may become dry, and the skin may peel and crack. You may also have nerve damage in your legs and feet causing decreased feeling in them. You may not notice minor injuries to your feet that could lead to infections or more serious problems. Taking care of your feet is one of the most important things you can do for yourself.  HOME CARE INSTRUCTIONS  Wear shoes at all times, even in the house. Do not go barefoot. Bare feet are easily injured.  Check your feet daily for blisters, cuts, and redness. If you cannot see the bottom of your feet, use a mirror or ask someone for help.  Wash your feet with warm water (do not use hot water) and mild soap. Then pat your feet and the areas between your toes until they are completely dry. Do not soak your feet as this can dry your skin.  Apply a moisturizing lotion or petroleum jelly (that does not contain alcohol and is unscented) to the skin on your feet and to dry, brittle toenails. Do not apply lotion between your toes.  Trim your toenails straight across. Do not dig under them or around the cuticle. File the edges of your nails with an emery board or nail file.  Do not cut corns or calluses or try to remove them with medicine.  Wear clean socks or stockings every day. Make sure they are not too tight. Do not wear knee-high stockings since they may decrease blood flow to your legs.  Wear shoes that fit properly and have enough cushioning. To break in new shoes, wear them for just a few hours a day. This prevents you from injuring your feet. Always look in your shoes before you put them on to be sure there are no objects inside.  Do not cross your legs. This may decrease the blood flow to your feet.  If you find a minor scrape,  cut, or break in the skin on your feet, keep it and the skin around it clean and dry. These areas may be cleansed with mild soap and water. Do not cleanse the area with peroxide, alcohol, or iodine.  When you remove an adhesive bandage, be sure not to damage the skin around it.  If you have a wound, look at it several times a day to make sure it is healing.  Do not use heating pads or hot water bottles. They may burn your skin. If you have lost feeling in your feet or legs, you may not know it is happening until it is too late.  Make sure your health care provider performs a complete foot exam at least annually or more often if you have foot problems. Report any cuts, sores, or bruises to your health care provider immediately. SEEK MEDICAL CARE IF:   You have an injury that is not healing.  You have cuts or breaks in the skin.  You have an ingrown nail.  You notice redness on your legs or feet.  You feel burning or tingling in your legs or feet.  You have pain or cramps in your legs and feet.  Your legs or feet are numb.  Your feet always feel cold. SEEK IMMEDIATE MEDICAL CARE IF:   There is increasing redness,   swelling, or pain in or around a wound.  There is a red line that goes up your leg.  Pus is coming from a wound.  You develop a fever or as directed by your health care provider.  You notice a bad smell coming from an ulcer or wound. Document Released: 10/19/2000 Document Revised: 06/24/2013 Document Reviewed: 03/31/2013 Exit[employer] Patient Information 2014 [employer].  

## 2013-10-31 ENCOUNTER — Other Ambulatory Visit: Payer: Self-pay | Admitting: Internal Medicine

## 2013-11-03 ENCOUNTER — Telehealth: Payer: Self-pay | Admitting: Family Medicine

## 2013-11-03 NOTE — Telephone Encounter (Signed)
Patient wants his simvastatin transferred from [facility] to [facility] [address]

## 2013-11-03 NOTE — Telephone Encounter (Signed)
Pt was informed that he is not supposed to be taking Simvastatin that MD had changed it to Atorvastatin.   Pts wife looked and pt is taking Atorvastatin and he states he doesn't need a refill on that

## 2013-11-17 ENCOUNTER — Other Ambulatory Visit: Payer: Self-pay | Admitting: Family Medicine

## 2013-11-20 ENCOUNTER — Ambulatory Visit: Payer: Medicare Other | Admitting: Family Medicine

## 2013-11-23 ENCOUNTER — Ambulatory Visit: Payer: Medicare Other | Admitting: Family Medicine

## 2013-11-27 ENCOUNTER — Ambulatory Visit: Payer: Medicare Other | Admitting: Family Medicine

## 2013-12-11 ENCOUNTER — Ambulatory Visit (INDEPENDENT_AMBULATORY_CARE_PROVIDER_SITE_OTHER): Payer: Medicare Other

## 2013-12-11 VITALS — BP 148/78 | HR 84 | Resp 16

## 2013-12-11 DIAGNOSIS — E1149 Type 2 diabetes mellitus with other diabetic neurological complication: Secondary | ICD-10-CM

## 2013-12-11 DIAGNOSIS — Q828 Other specified congenital malformations of skin: Secondary | ICD-10-CM

## 2013-12-11 NOTE — Progress Notes (Signed)
   Subjective:    Patient ID: [name], male    DOB: [birth year], 68 y.o.   MRN: [id number]  HPI Comments: "He might need to stretch the shoes a little more"  Diabetes      Review of Systems no new findings or changes     Objective:   Physical Exam Neurovascular status is intact with pedal pulses palpable patient is wearing diabetic shoes has pinch callus both hallux with dry skin did recommend using lotion twice daily not 2 or 3 times a week and also at this time the keratoses both great toe joints are debrided. Nails have been debrided by patient he is well managing his own nails at this time without difficulty. Patient also is hammertoe deformity with prominence of the fifth toe left foot we did increase the pocket sized to stretch of his diabetic shoes at this time with a spot stretching heating of the shoe.       Assessment & Plan:  Assessment diabetes with neuropathy history of some complications has a history of ulcerations has history of trauma and arthrosis left foot more so than right. This time shoes are spot stretched keratoses, bilateral hallux are debrided return for future palliative care in 3 or 4 months for an as-needed basis  Harriet Masson DPM

## 2013-12-11 NOTE — Patient Instructions (Signed)
Diabetes and Foot Care Diabetes may cause you to have problems because of poor blood supply (circulation) to your feet and legs. This may cause the skin on your feet to become thinner, break easier, and heal more slowly. Your skin may become dry, and the skin may peel and crack. You may also have nerve damage in your legs and feet causing decreased feeling in them. You may not notice minor injuries to your feet that could lead to infections or more serious problems. Taking care of your feet is one of the most important things you can do for yourself.  HOME CARE INSTRUCTIONS  Wear shoes at all times, even in the house. Do not go barefoot. Bare feet are easily injured.  Check your feet daily for blisters, cuts, and redness. If you cannot see the bottom of your feet, use a mirror or ask someone for help.  Wash your feet with warm water (do not use hot water) and mild soap. Then pat your feet and the areas between your toes until they are completely dry. Do not soak your feet as this can dry your skin.  Apply a moisturizing lotion or petroleum jelly (that does not contain alcohol and is unscented) to the skin on your feet and to dry, brittle toenails. Do not apply lotion between your toes.  Trim your toenails straight across. Do not dig under them or around the cuticle. File the edges of your nails with an emery board or nail file.  Do not cut corns or calluses or try to remove them with medicine.  Wear clean socks or stockings every day. Make sure they are not too tight. Do not wear knee-high stockings since they may decrease blood flow to your legs.  Wear shoes that fit properly and have enough cushioning. To break in new shoes, wear them for just a few hours a day. This prevents you from injuring your feet. Always look in your shoes before you put them on to be sure there are no objects inside.  Do not cross your legs. This may decrease the blood flow to your feet.  If you find a minor scrape,  cut, or break in the skin on your feet, keep it and the skin around it clean and dry. These areas may be cleansed with mild soap and water. Do not cleanse the area with peroxide, alcohol, or iodine.  When you remove an adhesive bandage, be sure not to damage the skin around it.  If you have a wound, look at it several times a day to make sure it is healing.  Do not use heating pads or hot water bottles. They may burn your skin. If you have lost feeling in your feet or legs, you may not know it is happening until it is too late.  Make sure your health care provider performs a complete foot exam at least annually or more often if you have foot problems. Report any cuts, sores, or bruises to your health care provider immediately. SEEK MEDICAL CARE IF:   You have an injury that is not healing.  You have cuts or breaks in the skin.  You have an ingrown nail.  You notice redness on your legs or feet.  You feel burning or tingling in your legs or feet.  You have pain or cramps in your legs and feet.  Your legs or feet are numb.  Your feet always feel cold. SEEK IMMEDIATE MEDICAL CARE IF:   There is increasing redness,   swelling, or pain in or around a wound.  There is a red line that goes up your leg.  Pus is coming from a wound.  You develop a fever or as directed by your health care provider.  You notice a bad smell coming from an ulcer or wound. Document Released: 10/19/2000 Document Revised: 06/24/2013 Document Reviewed: 03/31/2013 Exit[employer] Patient Information 2014 [employer].  

## 2013-12-28 ENCOUNTER — Telehealth: Payer: Self-pay | Admitting: *Deleted

## 2013-12-28 NOTE — Telephone Encounter (Signed)
Patient called requesting refill on Tramadol. Advised patient that Dr. Blenda Mounts is not in the office today and will check on this refill tomorrow.

## 2013-12-29 ENCOUNTER — Ambulatory Visit: Payer: Medicare Other | Admitting: Family Medicine

## 2013-12-29 ENCOUNTER — Other Ambulatory Visit: Payer: Self-pay

## 2013-12-29 MED ORDER — TRAMADOL HCL 50 MG PO TABS: ORAL_TABLET | ORAL | Status: DC

## 2013-12-29 NOTE — Telephone Encounter (Signed)
Informed pt [name] ordered Tramadol and he would need to pick the rx up in the [place] office.

## 2014-01-10 ENCOUNTER — Other Ambulatory Visit: Payer: Self-pay | Admitting: Family Medicine

## 2014-01-19 ENCOUNTER — Encounter: Payer: Self-pay | Admitting: Family Medicine

## 2014-01-19 ENCOUNTER — Ambulatory Visit (INDEPENDENT_AMBULATORY_CARE_PROVIDER_SITE_OTHER): Payer: Medicare Other | Admitting: Family Medicine

## 2014-01-19 VITALS — BP 134/82 | HR 85 | Temp 97.9°F | Ht 69.0 in | Wt 244.0 lb

## 2014-01-19 DIAGNOSIS — K219 Gastro-esophageal reflux disease without esophagitis: Secondary | ICD-10-CM

## 2014-01-19 DIAGNOSIS — E119 Type 2 diabetes mellitus without complications: Secondary | ICD-10-CM

## 2014-01-19 DIAGNOSIS — M5126 Other intervertebral disc displacement, lumbar region: Secondary | ICD-10-CM

## 2014-01-19 DIAGNOSIS — Z23 Encounter for immunization: Secondary | ICD-10-CM

## 2014-01-19 DIAGNOSIS — I69959 Hemiplegia and hemiparesis following unspecified cerebrovascular disease affecting unspecified side: Secondary | ICD-10-CM

## 2014-01-19 DIAGNOSIS — E1149 Type 2 diabetes mellitus with other diabetic neurological complication: Secondary | ICD-10-CM

## 2014-01-19 DIAGNOSIS — I10 Essential (primary) hypertension: Secondary | ICD-10-CM

## 2014-01-19 DIAGNOSIS — E785 Hyperlipidemia, unspecified: Secondary | ICD-10-CM

## 2014-01-19 NOTE — Patient Instructions (Addendum)
Moist heat, gentle stretch, ASpercreme or salon Pas rub   Labs At [facility] prior to next visit  Cervical Sprain A cervical sprain is an injury in the neck in which the strong, fibrous tissues (ligaments) that connect your neck bones stretch or tear. Cervical sprains can range from mild to severe. Severe cervical sprains can cause the neck vertebrae to be unstable. This can lead to damage of the spinal cord and can result in serious nervous system problems. The amount of time it takes for a cervical sprain to get better depends on the cause and extent of the injury. Most cervical sprains heal in 1 to 3 weeks. CAUSES  Severe cervical sprains may be caused by:   Contact sport injuries (such as from football, rugby, wrestling, hockey, auto racing, gymnastics, diving, martial arts, or boxing).   Motor vehicle collisions.   Whiplash injuries. This is an injury from a sudden forward-and backward whipping movement of the head and neck.  Falls.  Mild cervical sprains may be caused by:   Being in an awkward position, such as while cradling a telephone between your ear and shoulder.   Sitting in a chair that does not offer proper support.   Working at a poorly Landscape architect station.   Looking up or down for long periods of time.  SYMPTOMS   Pain, soreness, stiffness, or a burning sensation in the front, back, or sides of the neck. This discomfort may develop immediately after the injury or slowly, 24 hours or more after the injury.   Pain or tenderness directly in the middle of the back of the neck.   Shoulder or upper back pain.   Limited ability to move the neck.   Headache.   Dizziness.   Weakness, numbness, or tingling in the hands or arms.   Muscle spasms.   Difficulty swallowing or chewing.   Tenderness and swelling of the neck.  DIAGNOSIS  Most of the time your health care provider can diagnose a cervical sprain by taking your history and doing a physical  exam. Your health care provider will ask about previous neck injuries and any known neck problems, such as arthritis in the neck. X-rays may be taken to find out if there are any other problems, such as with the bones of the neck. Other tests, such as a CT scan or MRI, may also be needed.  TREATMENT  Treatment depends on the severity of the cervical sprain. Mild sprains can be treated with rest, keeping the neck in place (immobilization), and pain medicines. Severe cervical sprains are immediately immobilized. Further treatment is done to help with pain, muscle spasms, and other symptoms and may include:  Medicines, such as pain relievers, numbing medicines, or muscle relaxants.   Physical therapy. This may involve stretching exercises, strengthening exercises, and posture training. Exercises and improved posture can help stabilize the neck, strengthen muscles, and help stop symptoms from returning.  HOME CARE INSTRUCTIONS   Put ice on the injured area.   Put ice in a plastic bag.   Place a towel between your skin and the bag.   Leave the ice on for 15 20 minutes, 3 4 times a day.   If your injury was severe, you may have been given a cervical collar to wear. A cervical collar is a two-piece collar designed to keep your neck from moving while it heals.  Do not remove the collar unless instructed by your health care provider.  If you have long hair,  keep it outside of the collar.  Ask your health care provider before making any adjustments to your collar. Minor adjustments may be required over time to improve comfort and reduce pressure on your chin or on the back of your head.  Ifyou are allowed to remove the collar for cleaning or bathing, follow your health care provider's instructions on how to do so safely.  Keep your collar clean by wiping it with mild soap and water and drying it completely. If the collar you have been given includes removable pads, remove them every 1 2 days  and hand wash them with soap and water. Allow them to air dry. They should be completely dry before you wear them in the collar.  If you are allowed to remove the collar for cleaning and bathing, wash and dry the skin of your neck. Check your skin for irritation or sores. If you see any, tell your health care provider.  Do not drive while wearing the collar.   Only take over-the-counter or prescription medicines for pain, discomfort, or fever as directed by your health care provider.   Keep all follow-up appointments as directed by your health care provider.   Keep all physical therapy appointments as directed by your health care provider.   Make any needed adjustments to your workstation to promote good posture.   Avoid positions and activities that make your symptoms worse.   Warm up and stretch before being active to help prevent problems.  SEEK MEDICAL CARE IF:   Your pain is not controlled with medicine.   You are unable to decrease your pain medicine over time as planned.   Your activity level is not improving as expected.  SEEK IMMEDIATE MEDICAL CARE IF:   You develop any bleeding.  You develop stomach upset.  You have signs of an allergic reaction to your medicine.   Your symptoms get worse.   You develop new, unexplained symptoms.   You have numbness, tingling, weakness, or paralysis in any part of your body.  MAKE SURE YOU:   Understand these instructions.  Will watch your condition.  Will get help right away if you are not doing well or get worse. Document Released: 08/19/2007 Document Revised: 08/12/2013 Document Reviewed: 04/29/2013 [facility] Patient Information 2014 [employer].

## 2014-01-19 NOTE — Progress Notes (Signed)
Pre visit review using our clinic review tool, if applicable. No additional management support is needed unless otherwise documented below in the visit note. 

## 2014-01-20 ENCOUNTER — Telehealth: Payer: Self-pay | Admitting: Family Medicine

## 2014-01-20 NOTE — Telephone Encounter (Signed)
Relevant patient education assigned to patient using Emmi. ° °

## 2014-01-24 ENCOUNTER — Encounter: Payer: Self-pay | Admitting: Family Medicine

## 2014-01-24 NOTE — Assessment & Plan Note (Signed)
hgba1c acceptable, minimize simple carbs. Increase exercise as tolerated. Continue current meds 

## 2014-01-24 NOTE — Assessment & Plan Note (Signed)
Has followed with Dr Leonie Man, [facility] in past. Reports a h/o aneurysm. Would like a referral to [facility] for surveillance. No recent new concerns

## 2014-01-24 NOTE — Assessment & Plan Note (Signed)
Tolerating statin, encouraged heart healthy diet, avoid trans fats, minimize simple carbs and saturated fats. Increase exercise as tolerated 

## 2014-01-24 NOTE — Assessment & Plan Note (Signed)
Is following with neurosurgery Dr Arnoldo Morale. Continues to struggle with pain.

## 2014-01-24 NOTE — Assessment & Plan Note (Signed)
Avoid offending foods, start probiotics. Do not eat large meals in late evening and consider raising head of bed.  

## 2014-01-24 NOTE — Assessment & Plan Note (Signed)
Well controlled, no changes to meds. Encouraged heart healthy diet such as the DASH diet and exercise as tolerated.  °
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
## 2014-01-27 ENCOUNTER — Other Ambulatory Visit: Payer: Self-pay

## 2014-01-28 ENCOUNTER — Telehealth: Payer: Self-pay

## 2014-01-28 NOTE — Telephone Encounter (Signed)
Relevant patient education assigned to patient using Emmi. ° °

## 2014-02-08 ENCOUNTER — Telehealth: Payer: Self-pay | Admitting: Family Medicine

## 2014-02-08 MED ORDER — AMLODIPINE BESYLATE 5 MG PO TABS: ORAL_TABLET | ORAL | Status: DC

## 2014-02-08 NOTE — Telephone Encounter (Signed)
Refill amlodipine

## 2014-02-08 NOTE — Telephone Encounter (Signed)
Rx Sent  

## 2014-02-17 ENCOUNTER — Ambulatory Visit (INDEPENDENT_AMBULATORY_CARE_PROVIDER_SITE_OTHER): Payer: Medicare Other | Admitting: Family Medicine

## 2014-02-17 VITALS — BP 200/98 | HR 102 | Temp 97.5°F | Resp 22

## 2014-02-17 DIAGNOSIS — R42 Dizziness and giddiness: Secondary | ICD-10-CM

## 2014-02-17 LAB — POCT CBC
Granulocyte percent: 68.5 %G (ref 37–80)
HCT, POC: 46.5 % (ref 43.5–53.7)
Hemoglobin: 15.2 g/dL (ref 14.1–18.1)
Lymph, poc: 2.5 (ref 0.6–3.4)
MCH, POC: 32 pg — AB (ref 27–31.2)
MCHC: 32.7 g/dL (ref 31.8–35.4)
MCV: 97.9 fL — AB (ref 80–97)
MID (cbc): 0.7 (ref 0–0.9)
MPV: 9.2 fL (ref 0–99.8)
POC Granulocyte: 6.9 (ref 2–6.9)
POC LYMPH PERCENT: 24.9 %L (ref 10–50)
POC MID %: 6.6 %M (ref 0–12)
Platelet Count, POC: 249 10*3/uL (ref 142–424)
RBC: 4.75 M/uL (ref 4.69–6.13)
RDW, POC: 14.8 %
WBC: 10.1 10*3/uL (ref 4.6–10.2)

## 2014-02-17 LAB — GLUCOSE, POCT (MANUAL RESULT ENTRY): POC Glucose: 144 mg/dl — AB (ref 70–99)

## 2014-02-17 MED ORDER — MECLIZINE HCL 25 MG PO TABS
25.0000 mg | ORAL_TABLET | Freq: Three times a day (TID) | ORAL | Status: DC | PRN
Start: 2014-02-17 — End: 2015-02-17

## 2014-02-17 NOTE — Progress Notes (Signed)
Subjective:    Patient ID: [name], male    DOB: Dec 19, 1945, 68 y.o.   MRN: [id number]  HPI  68 YO male patient presents to the clinic emergently with dizziness and syncope. Pt states he was doing some house work involving climbing a ladder and pt became dizzy, diaphoretic and then passed out. He woke up on the floor and had his wife bring him in.   Pt has not had this happen before.  Review of Systems     Objective:   Physical Exam  Patient is alert and cooperative.  HEENT: Unremarkable with exception of some scarring on the right tympanic membrane Oropharynx: Clear with intact cranial nerves III through XII. Neck: Supple no adenopathy or bruits Chest: Clear Heart: Regular no murmur or gallop Abdomen: Soft nontender Extremities: No edema, rash or suspicious lesions EKG: Normal sinus rhythm Results for orders placed in visit on 09/14/13  CBC WITH DIFFERENTIAL      Result Value Ref Range   WBC 11.5 (*) 4.5 - 10.5 K/uL   RBC 4.32  4.22 - 5.81 Mil/uL   Hemoglobin 13.5  13.0 - 17.0 g/dL   HCT 40.0  39.0 - 52.0 %   MCV 92.6  78.0 - 100.0 fl   MCHC 33.7  30.0 - 36.0 g/dL   RDW 13.6  11.5 - 14.6 %   Platelets 233.0  150.0 - 400.0 K/uL   Neutrophils Relative % 58.9  43.0 - 77.0 %   Lymphocytes Relative 32.4  12.0 - 46.0 %   Monocytes Relative 6.6  3.0 - 12.0 %   Eosinophils Relative 1.8  0.0 - 5.0 %   Basophils Relative 0.3  0.0 - 3.0 %   Neutro Abs 6.8  1.4 - 7.7 K/uL   Lymphs Abs 3.7  0.7 - 4.0 K/uL   Monocytes Absolute 0.8  0.1 - 1.0 K/uL   Eosinophils Absolute 0.2  0.0 - 0.7 K/uL   Basophils Absolute 0.0  0.0 - 0.1 K/uL  BASIC METABOLIC PANEL      Result Value Ref Range   Sodium 139  135 - 145 mEq/L   Potassium 3.9  3.5 - 5.1 mEq/L   Chloride 100  96 - 112 mEq/L   CO2 29  19 - 32 mEq/L   Glucose, Bld 131 (*) 70 - 99 mg/dL   BUN 23  6 - 23 mg/dL   Creatinine, Ser 1.3  0.4 - 1.5 mg/dL   Calcium 9.4  8.4 - 10.5 mg/dL   GFR 56.67 (*) >60.00 mL/min  VITAMIN  B12      Result Value Ref Range   Vitamin B-12 1150 (*) 211 - 911 pg/mL  INTRINSIC FACTOR BLOCKING ANTIBODY      Result Value Ref Range   Intrinsic Factor Negative  Negative   Results for orders placed in visit on 02/17/14  POCT CBC      Result Value Ref Range   WBC 10.1  4.6 - 10.2 K/uL   Lymph, poc 2.5  0.6 - 3.4   POC LYMPH PERCENT 24.9  10 - 50 %L   MID (cbc) 0.7  0 - 0.9   POC MID % 6.6  0 - 12 %M   POC Granulocyte 6.9  2 - 6.9   Granulocyte percent 68.5  37 - 80 %G   RBC 4.75  4.69 - 6.13 M/uL   Hemoglobin 15.2  14.1 - 18.1 g/dL   HCT, POC 46.5  43.5 - 53.7 %  MCV 97.9 (*) 80 - 97 fL   MCH, POC 32.0 (*) 27 - 31.2 pg   MCHC 32.7  31.8 - 35.4 g/dL   RDW, POC 14.8     Platelet Count, POC 249  142 - 424 K/uL   MPV 9.2  0 - 99.8 fL  GLUCOSE, POCT (MANUAL RESULT ENTRY)      Result Value Ref Range   POC Glucose 144 (*) 70 - 99 mg/dl   BP Came down to 170/84 discharge    Assessment & Plan:  Dizziness and giddiness - Plan: POCT CBC, POCT glucose (manual entry), Comprehensive metabolic panel, EKG 16-SAYT, meclizine (ANTIVERT) 25 MG tablet  Signed, Robyn Haber, MD

## 2014-02-17 NOTE — Patient Instructions (Signed)
Vertigo Vertigo means you feel like you or your surroundings are moving when they are not. Vertigo can be dangerous if it occurs when you are at work, driving, or performing difficult activities.  CAUSES  Vertigo occurs when there is a conflict of signals sent to your brain from the visual and sensory systems in your body. There are many different causes of vertigo, including:  Infections, especially in the inner ear.  A bad reaction to a drug or misuse of alcohol and medicines.  Withdrawal from drugs or alcohol.  Rapidly changing positions, such as lying down or rolling over in bed.  A migraine headache.  Decreased blood flow to the brain.  Increased pressure in the brain from a head injury, infection, tumor, or bleeding. SYMPTOMS  You may feel as though the world is spinning around or you are falling to the ground. Because your balance is upset, vertigo can cause nausea and vomiting. You may have involuntary eye movements (nystagmus). DIAGNOSIS  Vertigo is usually diagnosed by physical exam. If the cause of your vertigo is unknown, your caregiver may perform imaging tests, such as an MRI scan (magnetic resonance imaging). TREATMENT  Most cases of vertigo resolve on their own, without treatment. Depending on the cause, your caregiver may prescribe certain medicines. If your vertigo is related to body position issues, your caregiver may recommend movements or procedures to correct the problem. In rare cases, if your vertigo is caused by certain inner ear problems, you may need surgery. HOME CARE INSTRUCTIONS   Follow your caregiver's instructions.  Avoid driving.  Avoid operating heavy machinery.  Avoid performing any tasks that would be dangerous to you or others during a vertigo episode.  Tell your caregiver if you notice that certain medicines seem to be causing your vertigo. Some of the medicines used to treat vertigo episodes can actually make them worse in some people. SEEK  IMMEDIATE MEDICAL CARE IF:   Your medicines do not relieve your vertigo or are making it worse.  You develop problems with talking, walking, weakness, or using your arms, hands, or legs.  You develop severe headaches.  Your nausea or vomiting continues or gets worse.  You develop visual changes.  A family member notices behavioral changes.  Your condition gets worse. MAKE SURE YOU:  Understand these instructions.  Will watch your condition.  Will get help right away if you are not doing well or get worse. Document Released: 08/01/2005 Document Revised: 01/14/2012 Document Reviewed: 05/10/2011 [employer] Patient Information 2014 [employer].  

## 2014-02-18 LAB — COMPREHENSIVE METABOLIC PANEL
ALT: 20 U/L (ref 0–53)
AST: 18 U/L (ref 0–37)
Albumin: 4.7 g/dL (ref 3.5–5.2)
Alkaline Phosphatase: 83 U/L (ref 39–117)
BUN: 15 mg/dL (ref 6–23)
CO2: 28 mEq/L (ref 19–32)
Calcium: 10.6 mg/dL — ABNORMAL HIGH (ref 8.4–10.5)
Chloride: 98 mEq/L (ref 96–112)
Creat: 0.78 mg/dL (ref 0.50–1.35)
Glucose, Bld: 142 mg/dL — ABNORMAL HIGH (ref 70–99)
Potassium: 3.9 mEq/L (ref 3.5–5.3)
Sodium: 141 mEq/L (ref 135–145)
Total Bilirubin: 0.5 mg/dL (ref 0.2–1.2)
Total Protein: 7.7 g/dL (ref 6.0–8.3)

## 2014-02-24 ENCOUNTER — Other Ambulatory Visit: Payer: Self-pay

## 2014-02-24 MED ORDER — ATORVASTATIN CALCIUM 80 MG PO TABS: 80.0000 mg | ORAL_TABLET | Freq: Every day | ORAL | Status: DC

## 2014-03-01 ENCOUNTER — Other Ambulatory Visit: Payer: Self-pay

## 2014-03-01 MED ORDER — ATORVASTATIN CALCIUM 80 MG PO TABS: 80.0000 mg | ORAL_TABLET | Freq: Every day | ORAL | Status: DC

## 2014-03-02 ENCOUNTER — Encounter: Payer: Self-pay | Admitting: Neurology

## 2014-03-02 ENCOUNTER — Ambulatory Visit (INDEPENDENT_AMBULATORY_CARE_PROVIDER_SITE_OTHER): Payer: Medicare Other | Admitting: Neurology

## 2014-03-02 VITALS — BP 144/80 | HR 83 | Ht 69.0 in | Wt 245.3 lb

## 2014-03-02 DIAGNOSIS — I671 Cerebral aneurysm, nonruptured: Secondary | ICD-10-CM

## 2014-03-02 HISTORY — DX: Cerebral aneurysm, nonruptured: I67.1
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
## 2014-03-02 NOTE — Patient Instructions (Addendum)
1.  MRA head  2.  Return to clinic in 6 weeks

## 2014-03-04 ENCOUNTER — Other Ambulatory Visit: Payer: Self-pay

## 2014-03-10 ENCOUNTER — Ambulatory Visit
Admission: RE | Admit: 2014-03-10 | Discharge: 2014-03-10 | Disposition: A | Discharge status: Home / Self Care | Payer: Medicare Other | Source: Ambulatory Visit | Attending: Neurology | Admitting: Neurology

## 2014-03-10 DIAGNOSIS — I671 Cerebral aneurysm, nonruptured: Secondary | ICD-10-CM

## 2014-03-11 ENCOUNTER — Telehealth: Payer: Self-pay | Admitting: Neurology

## 2014-03-11 NOTE — Telephone Encounter (Signed)
Called and discuss results of MR a brain. His known right paraophthalmic aneurysm is stable in size, 8 x 5 x 11mm.  Will continue annual surveillance.  Donika K. Posey Pronto, DO

## 2014-03-15 ENCOUNTER — Telehealth: Payer: Self-pay | Admitting: *Deleted

## 2014-03-16 NOTE — Telephone Encounter (Signed)
I called and informed her that when he requested the last refill Dr. Blenda Mounts said the patient will need to be seen before he gets any further refills.  She stated oh okay.  She said his next appointment isn't until 04/09/2014.  I asked if she would like to reschedule his appointment.  She stated yes.  I transferred her to a scheduler.

## 2014-03-17 ENCOUNTER — Telehealth: Payer: Self-pay

## 2014-03-31 ENCOUNTER — Telehealth: Payer: Self-pay | Admitting: *Deleted

## 2014-03-31 ENCOUNTER — Ambulatory Visit: Payer: Medicare Other

## 2014-03-31 NOTE — Telephone Encounter (Signed)
He needs to get some Tramadol.  He's in a lot of pain.  His appointment was canceled for today.  Reason for the appointment was to get medication.  If he can get in with another doctor we'd appreciate it!  This is my second time for calling today!  I returned her call and informed her we do not have any appointments available on today.  Dr. Blenda Mounts will be in our [place] office on tomorrow.  We will send him the message then.  She stated that will be fine.

## 2014-03-31 NOTE — Telephone Encounter (Signed)
He had an appointment with Dr. Blenda Mounts today.  You all called and canceled his appointment at the end of the day yesterday.  He needs a refill on his Tramadol that's why we were coming today.  We need another doctor to refill his Tramadol.

## 2014-04-01 MED ORDER — TRAMADOL HCL 50 MG PO TABS: ORAL_TABLET | ORAL | Status: DC

## 2014-04-01 NOTE — Telephone Encounter (Signed)
Okay for refill of tramadol same his last prescription in February next  Harriet Masson DPM

## 2014-04-01 NOTE — Telephone Encounter (Signed)
I called and informed the patient's wife that Dr. Noralyn Pick the refill.  I informed her that the patient will have to come by the office to pick it up and will have to show his identification.  She stated okay.

## 2014-04-05 ENCOUNTER — Ambulatory Visit (INDEPENDENT_AMBULATORY_CARE_PROVIDER_SITE_OTHER): Payer: Medicare Other

## 2014-04-05 VITALS — BP 159/79 | HR 99 | Resp 12

## 2014-04-05 DIAGNOSIS — E1149 Type 2 diabetes mellitus with other diabetic neurological complication: Secondary | ICD-10-CM

## 2014-04-05 DIAGNOSIS — M79609 Pain in unspecified limb: Secondary | ICD-10-CM

## 2014-04-05 DIAGNOSIS — G609 Hereditary and idiopathic neuropathy, unspecified: Secondary | ICD-10-CM

## 2014-04-05 DIAGNOSIS — Q828 Other specified congenital malformations of skin: Secondary | ICD-10-CM

## 2014-04-05 DIAGNOSIS — G629 Polyneuropathy, unspecified: Secondary | ICD-10-CM

## 2014-04-05 DIAGNOSIS — Z79899 Other long term (current) drug therapy: Secondary | ICD-10-CM

## 2014-04-05 NOTE — Progress Notes (Signed)
   Subjective:    Patient ID: [name], male    DOB: 1946/08/14, 68 y.o.   MRN: [id number]  HPI        ''both feet still having pain and burning sensation.''  Review of Systems no new findings or systemic changes are noted     Objective:   Physical Exam Neurovascular status is intact pedal pulses palpable epicritic and proprioceptive sensations diminished on Semmes Weinstein testing. Patient been taking the gabapentin 3 mg 2 tablets 3 times daily has had significant improvement although still having pain burning stinging sensation to especially brown bedtime. Patient's blood glucose within study A1c was 6.2 blood sugars are running between 11/25/1928 tops patient otherwise history well however long-standing diabetes likely left with profound neuritis or neuralgia diabetic neuropathy hours a trial patient has been on pain and for to 4 for a knee injury and problems and has a TENS unit at home. Discussed electrostimulation for the neuropathy our base and affect your he has a TENS unit home patient advised applying electrodes to the foot and ankle area and try treating both feet alternating every evening with 15-30 minutes per foot treatment each evening utilizing a TENS unit he currently has       Assessment & Plan:  Assessment diabetes with peripheral neuropathy and neuritis neuralgia appears to be stable however not completely resolved at this time an adjunct therapy to taking gabapentin patient is will try using a TENS unit your he has a home and applied to the feet and ankle areas with the past and apply stimulation every evening and just to tolerance. Patient be recheck in 2-3 months for reevaluation posse select custom intense nerve stimulation as well as needed continue use of gabapentin may provide significant early stable relief of his symptoms. Recheck in 2-3 months for followup next  Harriet Masson DPM

## 2014-04-05 NOTE — Patient Instructions (Signed)
Peripheral Neuropathy Peripheral neuropathy is a type of nerve damage. It affects nerves that carry signals between the spinal cord and other parts of the body. These are called peripheral nerves. With peripheral neuropathy, one nerve or a group of nerves may be damaged.  CAUSES  Many things can damage peripheral nerves. For some people with peripheral neuropathy, the cause is unknown. Some causes include:  Diabetes. This is the most common cause of peripheral neuropathy.  Injury to a nerve.  Pressure or stress on a nerve that lasts a long time.  Too little vitamin B. Alcoholism can lead to this.  Infections.  Autoimmune diseases, such as multiple sclerosis and systemic lupus erythematosus.  Inherited nerve diseases.  Some medicines, such as cancer drugs.  Toxic substances, such as lead and mercury.  Too little blood flowing to the legs.  Kidney disease.  Thyroid disease. SIGNS AND SYMPTOMS  Different people have different symptoms. The symptoms you have will depend on which of your nerves is damaged. Common symptoms include:  Loss of feeling (numbness) in the feet and hands.  Tingling in the feet and hands.  Pain that burns.  Very sensitive skin.  Weakness.  Not being able to move a part of the body (paralysis).  Muscle twitching.  Clumsiness or poor coordination.  Loss of balance.  Not being able to control your bladder.  Feeling dizzy.  Sexual problems. DIAGNOSIS  Peripheral neuropathy is a symptom, not a disease. Finding the cause of peripheral neuropathy can be hard. To figure that out, your health care provider will take a medical history and do a physical exam. A neurological exam will also be done. This involves checking things affected by your brain, spinal cord, and nerves (nervous system). For example, your health care provider will check your reflexes, how you move, and what you can feel.  Other types of tests may also be ordered, such as:  Blood  tests.  A test of the fluid in your spinal cord.  Imaging tests, such as CT scans or an MRI.  Electromyography (EMG). This test checks the nerves that control muscles.  Nerve conduction velocity tests. These tests check how fast messages pass through your nerves.  Nerve biopsy. A small piece of nerve is removed. It is then checked under a microscope. TREATMENT   Medicine is often used to treat peripheral neuropathy. Medicines may include:  Pain-relieving medicines. Prescription or over-the-counter medicine may be suggested.  Antiseizure medicine. This may be used for pain.  Antidepressants. These also may help ease pain from neuropathy.  Lidocaine. This is a numbing medicine. You might wear a patch or be given a shot.  Mexiletine. This medicine is typically used to help control irregular heart rhythms.  Surgery. Surgery may be needed to relieve pressure on a nerve or to destroy a nerve that is causing pain.  Physical therapy to help movement.  Assistive devices to help movement. HOME CARE INSTRUCTIONS   Only take over-the-counter or prescription medicines as directed by your health care provider. Follow the instructions carefully for any given medicines. Do not take any other medicines without first getting approval from your health care provider.  If you have diabetes, work closely with your health care provider to keep your blood sugar under control.  If you have numbness in your feet:  Check every day for signs of injury or infection. Watch for redness, warmth, and swelling.  Wear padded socks and comfortable shoes. These help protect your feet.  Do not do   things that put pressure on your damaged nerve.  Do not smoke. Smoking keeps blood from getting to damaged nerves.  Avoid or limit alcohol. Too much alcohol can cause a lack of B vitamins. These vitamins are needed for healthy nerves.  Develop a good support system. Coping with peripheral neuropathy can be  stressful. Talk to a mental health specialist or join a support group if you are struggling.  Follow up with your health care provider as directed. SEEK MEDICAL CARE IF:   You have new signs or symptoms of peripheral neuropathy.  You are struggling emotionally from dealing with peripheral neuropathy.  You have a fever. SEEK IMMEDIATE MEDICAL CARE IF:   You have an injury or infection that is not healing.  You feel very dizzy or begin vomiting.  You have chest pain.  You have trouble breathing. Document Released: 10/12/2002 Document Revised: 07/04/2011 Document Reviewed: 06/29/2013 [facility] Patient Information 2014 [place].    Recommendations for use of TENS unit. Use a TENS unit your rehabilitation apply the pads on the inside and outside of the ankle and also on the top and bottom's of the foot and arch area . Use the tennis stimulator for 15-20 minutes on each foot every evening for the next one to 2 months and see if this helps improve symptomology along with continued use of gabapentin

## 2014-04-09 ENCOUNTER — Ambulatory Visit: Payer: Medicare Other

## 2014-04-10 ENCOUNTER — Other Ambulatory Visit: Payer: Self-pay | Admitting: Family Medicine

## 2014-04-12 ENCOUNTER — Telehealth: Payer: Self-pay | Admitting: Neurology

## 2014-04-12 NOTE — Telephone Encounter (Signed)
Pt states he takes the Clonidine 1/2 tab bid but he is going to be out of town towards the end of the month and they are just wanting to pick up the refills

## 2014-04-12 NOTE — Telephone Encounter (Signed)
Please advise refill? Last rx was done on 10-31-13 quantity 90 with 1 refill   If ok fax to [phone]

## 2014-04-12 NOTE — Telephone Encounter (Signed)
Pt wife called and wants to know why pt needs to come back in to the office she states that Dr Posey Pronto called with the results please call [phone]

## 2014-04-12 NOTE — Telephone Encounter (Signed)
Left a message for patient to return my call. 

## 2014-04-12 NOTE — Telephone Encounter (Signed)
I will approve but please make sure with patient that he is still taking them and how. THX

## 2014-04-12 NOTE — Telephone Encounter (Signed)
I spoke with patient's wife and informed her that this appointment was a follow up.  She said that he is doing ok and wanted to cancel the appointment.   Appointment cancelled by Hinton Dyer.

## 2014-04-12 NOTE — Telephone Encounter (Signed)
Pt left a message on my vm but didn't include the directions of how he is taking the clonodine.  I left another message on his vm

## 2014-04-14 ENCOUNTER — Other Ambulatory Visit: Payer: Self-pay

## 2014-04-14 ENCOUNTER — Ambulatory Visit: Payer: Medicare Other | Admitting: Neurology

## 2014-04-14 ENCOUNTER — Other Ambulatory Visit: Payer: Self-pay | Admitting: Family Medicine

## 2014-04-21 ENCOUNTER — Other Ambulatory Visit: Payer: Self-pay | Admitting: Neurosurgery

## 2014-04-21 DIAGNOSIS — M5416 Radiculopathy, lumbar region: Secondary | ICD-10-CM

## 2014-04-24 ENCOUNTER — Other Ambulatory Visit: Payer: Self-pay | Admitting: Family Medicine

## 2014-04-26 NOTE — Telephone Encounter (Signed)
Refill sent per [facility] refill protocol/SLS  

## 2014-04-28 ENCOUNTER — Other Ambulatory Visit: Payer: Medicare Other

## 2014-05-04 ENCOUNTER — Ambulatory Visit
Admission: RE | Admit: 2014-05-04 | Discharge: 2014-05-04 | Disposition: A | Discharge status: Home / Self Care | Payer: Medicare Other | Source: Ambulatory Visit | Attending: Neurosurgery | Admitting: Neurosurgery

## 2014-05-04 VITALS — BP 123/49 | HR 77

## 2014-05-04 DIAGNOSIS — M5416 Radiculopathy, lumbar region: Secondary | ICD-10-CM

## 2014-05-04 MED ORDER — IOHEXOL 180 MG/ML  SOLN
15.0000 mL | Freq: Once | INTRAMUSCULAR | Status: AC | PRN
  Administered 2014-05-04: 15 mL via INTRATHECAL

## 2014-05-04 MED ORDER — DIAZEPAM 5 MG PO TABS: 5.0000 mg | ORAL_TABLET | Freq: Once | ORAL | Status: DC

## 2014-05-04 NOTE — Progress Notes (Signed)
Pt states he has been off Tramadol for the past two days.   Discharge instructions explained to pt.

## 2014-05-04 NOTE — Discharge Instructions (Signed)
Myelogram Discharge Instructions  1. Go home and rest quietly for the next 24 hours.  It is important to lie flat for the next 24 hours.  Get up only to go to the restroom.  You may lie in the bed or on a couch on your back, your stomach, your left side or your right side.  You may have one pillow under your head.  You may have pillows between your knees while you are on your side or under your knees while you are on your back.  2. DO NOT drive today.  Recline the seat as far back as it will go, while still wearing your seat belt, on the way home.  3. You may get up to go to the bathroom as needed.  You may sit up for 10 minutes to eat.  You may resume your normal diet and medications unless otherwise indicated.  Drink lots of extra fluids today and tomorrow.  4. The incidence of headache, nausea, or vomiting is about 5% (one in 20 patients).  If you develop a headache, lie flat and drink plenty of fluids until the headache goes away.  Caffeinated beverages may be helpful.  If you develop severe nausea and vomiting or a headache that does not go away with flat bed rest, call [phone].  5. You may resume normal activities after your 24 hours of bed rest is over; however, do not exert yourself strongly or do any heavy lifting tomorrow. If when you get up you have a headache when standing, go back to bed and force fluids for another 24 hours.  6. Call your physician for a follow-up appointment.  The results of your myelogram will be sent directly to your physician by the following day.  7. If you have any questions or if complications develop after you arrive home, please call [phone].  Discharge instructions have been explained to the patient.  The patient, or the person responsible for the patient, fully understands these instructions.      May resume Tramadol on May 05, 2014, after 1:00 pm.

## 2014-05-06 ENCOUNTER — Other Ambulatory Visit: Payer: Self-pay

## 2014-05-20 ENCOUNTER — Telehealth: Payer: Self-pay | Admitting: Family Medicine

## 2014-05-20 DIAGNOSIS — E785 Hyperlipidemia, unspecified: Secondary | ICD-10-CM

## 2014-05-20 MED ORDER — ATORVASTATIN CALCIUM 80 MG PO TABS: 80.0000 mg | ORAL_TABLET | Freq: Every day | ORAL | Status: DC

## 2014-05-20 NOTE — Telephone Encounter (Signed)
Refill sent.

## 2014-05-20 NOTE — Telephone Encounter (Signed)
Refill- atorvastatin  Patient is requesting 90 day supply  [facility]

## 2014-05-21 ENCOUNTER — Other Ambulatory Visit: Payer: Self-pay | Admitting: *Deleted

## 2014-05-21 MED ORDER — GABAPENTIN 300 MG PO CAPS: 300.0000 mg | ORAL_CAPSULE | Freq: Three times a day (TID) | ORAL | Status: DC

## 2014-05-21 NOTE — Telephone Encounter (Signed)
Refill request sent for Gabapentin 300mg  90 day supply.  Prescription was okayed for a month supply.

## 2014-05-27 ENCOUNTER — Encounter: Payer: Self-pay | Admitting: Family Medicine

## 2014-05-27 ENCOUNTER — Ambulatory Visit (INDEPENDENT_AMBULATORY_CARE_PROVIDER_SITE_OTHER): Payer: Medicare Other | Admitting: Family Medicine

## 2014-05-27 DIAGNOSIS — E1149 Type 2 diabetes mellitus with other diabetic neurological complication: Secondary | ICD-10-CM

## 2014-05-27 DIAGNOSIS — I10 Essential (primary) hypertension: Secondary | ICD-10-CM

## 2014-05-27 DIAGNOSIS — M5126 Other intervertebral disc displacement, lumbar region: Secondary | ICD-10-CM

## 2014-05-27 DIAGNOSIS — R21 Rash and other nonspecific skin eruption: Secondary | ICD-10-CM

## 2014-05-27 DIAGNOSIS — R7309 Other abnormal glucose: Secondary | ICD-10-CM

## 2014-05-27 DIAGNOSIS — R739 Hyperglycemia, unspecified: Secondary | ICD-10-CM

## 2014-05-27 DIAGNOSIS — E785 Hyperlipidemia, unspecified: Secondary | ICD-10-CM

## 2014-05-27 DIAGNOSIS — G609 Hereditary and idiopathic neuropathy, unspecified: Secondary | ICD-10-CM

## 2014-05-27 DIAGNOSIS — K219 Gastro-esophageal reflux disease without esophagitis: Secondary | ICD-10-CM

## 2014-05-27 LAB — HEMOGLOBIN A1C
Hgb A1c MFr Bld: 7.6 % — ABNORMAL HIGH (ref <5.7)
Mean Plasma Glucose: 171 mg/dL — ABNORMAL HIGH (ref <117)

## 2014-05-27 LAB — CBC
HCT: 38.8 % — ABNORMAL LOW (ref 39.0–52.0)
Hemoglobin: 13.9 g/dL (ref 13.0–17.0)
MCH: 32.7 pg (ref 26.0–34.0)
MCHC: 35.8 g/dL (ref 30.0–36.0)
MCV: 91.3 fL (ref 78.0–100.0)
Platelets: 269 10*3/uL (ref 150–400)
RBC: 4.25 MIL/uL (ref 4.22–5.81)
RDW: 14.4 % (ref 11.5–15.5)
WBC: 8.5 10*3/uL (ref 4.0–10.5)

## 2014-05-27 MED ORDER — GABAPENTIN 300 MG PO CAPS: 600.0000 mg | ORAL_CAPSULE | Freq: Three times a day (TID) | ORAL | Status: DC

## 2014-05-27 MED ORDER — TRAMADOL HCL 50 MG PO TABS: ORAL_TABLET | ORAL | Status: DC

## 2014-05-27 NOTE — Patient Instructions (Addendum)
Try [facility] and [facility]  Psoriasis Psoriasis is a common, long-lasting (chronic) inflammation of the skin. It affects both men and women equally, of all ages and all races. Psoriasis cannot be passed from person to person (not contagious). Psoriasis varies from mild to very severe. When severe, it can greatly affect your quality of life. Psoriasis is an inflammatory disorder affecting the skin as well as other organs including the joints (causing an arthritis). With psoriasis, the skin sheds its top layer of cells more rapidly than it does in someone without psoriasis. CAUSES  The cause of psoriasis is largely unknown. Genetics, your immune system, and the environment seem to play a role in causing psoriasis. Factors that can make psoriasis worse include:  Damage or trauma to the skin, such as cuts, scrapes, and sunburn. This damage often causes new areas of psoriasis (lesions).  Winter dryness and lack of sunlight.  Medicines such as lithium, beta-blockers, antimalarial drugs, ACE inhibitors, nonsteroidal anti-inflammatory drugs (ibuprofen, aspirin), and terbinafine. Let your caregiver know if you are taking any of these drugs.  Alcohol. Excessive alcohol use should be avoided if you have psoriasis. Drinking large amounts of alcohol can affect:  How well your psoriasis treatment works.  How safe your psoriasis treatment is.  Smoking. If you smoke, ask your caregiver for help to quit.  Stress.  Bacterial or viral infections.  Arthritis. Arthritis associated with psoriasis (psoriatic arthritis) affects less than 10% of patients with psoriasis. The arthritic intensity does not always match the skin psoriasis intensity. It is important to let your caregiver know if your joints hurt or if they are stiff. SYMPTOMS  The most common form of psoriasis begins with little red bumps that gradually become larger. The bumps begin to form scales that flake off easily. The lower layers of  scales stick together. When these scales are scratched or removed, the underlying skin is tender and bleeds easily. These areas then grow in size and may become large. Psoriasis often creates a rash that looks the same on both sides of the body (symmetrical). It often affects the elbows, knees, groin, genitals, arms, legs, scalp, and nails. Affected nails often have pitting, loosen, thicken, crumble, and are difficult to treat.  "Inverse psoriasis"occurs in the armpits, under breasts, in skin folds, and around the groin, buttocks, and genitals.  "Guttate psoriasis" generally occurs in children and young adults following a recent sore throat (strep throat). It begins with many small, red, scaly spots on the skin. It clears spontaneously in weeks or a few months without treatment. DIAGNOSIS  Psoriasis is diagnosed by physical exam. A tissue sample (biopsy) may also be taken. TREATMENT The treatment of psoriasis depends on your age, health, and living conditions.  Steroid (cortisone) creams, lotions, and ointments may be used. These treatments are associated with thinning of the skin, blood vessels that get larger (dilated), loss of skin pigmentation, and easy bruising. It is important to use these steroids as directed by your caregiver. Only treat the affected areas and not the normal, unaffected skin. People on long-term steroid treatment should wear a medical alert bracelet. Injections may be used in areas that are difficult to treat.  Scalp treatments are available as shampoos, solutions, sprays, foams, and oils. Avoid scratching the scalp and picking at the scales.  Anthralin medicine works well on areas that are difficult to treat. However, it stains clothes and skin and may cause temporary irritation.  Synthetic vitamin D (calcipotriene)can be used on small areas.  It is available by prescription. The forms of synthetic vitamin D available in health food stores do not help with  psoriasis.  Coal tarsare available in various strengths for psoriasis that is difficult to treat. They are one of the longest used treatments for difficult to treat psoriasis. However, they are messy to use.  Light therapy (UV therapy) can be carefully and professionally monitored in a dermatologist's office. Careful sunbathing is helpful for many people as directed by your caregiver. The exposure should be just long enough to cause a mild redness (erythema) of your skin. Avoid sunburn as this may make the condition worse. Sunscreen (SPF of 30 or higher) should be used to protect against sunburn. Cataracts, wrinkles, and skin aging are some of the harmful side effects of light therapy.  If creams (topical medicines) fail, there are several other options for systemic or oral medicines your caregiver can suggest. Psoriasis can sometimes be very difficult to treat. It can come and go. It is necessary to follow up with your caregiver regularly if your psoriasis is difficult to treat. Usually, with persistence you can get a good amount of relief. Maintaining consistent care is important. Do not change caregivers just because you do not see immediate results. It may take several trials to find the right combination of treatment for you. PREVENTING FLARE-UPS  Wear gloves while you wash dishes, while cleaning, and when you are outside in the cold.  If you have radiators, place a bowl of water or damp towel on the radiator. This will help put water back in the air. You can also use a humidifier to keep the air moist. Try to keep the humidity at about 60% in your home.  Apply moisturizer while your skin is still damp from bathing or showering. This traps water in the skin.  Avoid long, hot baths or showers. Keep soap use to a minimum. Soaps dry out the skin and wash away the protective oils. Use a fragrance free, dye free soap.  Drink enough water and fluids to keep your urine clear or pale yellow. Not  drinking enough water depletes your skin's water supply.  Turn off the heat at night and keep it low during the day. Cool air is less drying. SEEK MEDICAL CARE IF:  You have increasing pain in the affected areas.  You have uncontrolled bleeding in the affected areas.  You have increasing redness or warmth in the affected areas.  You start to have pain or stiffness in your joints.  You start feeling depressed about your condition.  You have a fever. Document Released: 10/19/2000 Document Revised: 01/14/2012 Document Reviewed: 04/16/2011 [facility] Patient Information 2015 [place], [place]. This information is not intended to replace advice given to you by your health care provider. Make sure you discuss any questions you have with your health care provider.

## 2014-05-27 NOTE — Progress Notes (Signed)
Pre visit review using our clinic review tool, if applicable. No additional management support is needed unless otherwise documented below in the visit note. 

## 2014-05-28 ENCOUNTER — Encounter: Payer: Self-pay | Admitting: Family Medicine

## 2014-05-28 LAB — HEPATIC FUNCTION PANEL
ALT: 23 U/L (ref 0–53)
AST: 21 U/L (ref 0–37)
Albumin: 4.2 g/dL (ref 3.5–5.2)
Alkaline Phosphatase: 82 U/L (ref 39–117)
Bilirubin, Direct: 0.1 mg/dL (ref 0.0–0.3)
Indirect Bilirubin: 0.3 mg/dL (ref 0.2–1.2)
Total Bilirubin: 0.4 mg/dL (ref 0.2–1.2)
Total Protein: 7.1 g/dL (ref 6.0–8.3)

## 2014-05-28 LAB — PTH, INTACT AND CALCIUM
Calcium: 9.5 mg/dL (ref 8.4–10.5)
PTH: 28.7 pg/mL (ref 14.0–72.0)

## 2014-05-28 LAB — RENAL FUNCTION PANEL
Albumin: 4.2 g/dL (ref 3.5–5.2)
BUN: 15 mg/dL (ref 6–23)
CO2: 29 mEq/L (ref 19–32)
Calcium: 9.5 mg/dL (ref 8.4–10.5)
Chloride: 99 mEq/L (ref 96–112)
Creat: 0.77 mg/dL (ref 0.50–1.35)
Glucose, Bld: 179 mg/dL — ABNORMAL HIGH (ref 70–99)
Phosphorus: 3.7 mg/dL (ref 2.3–4.6)
Potassium: 4.6 mEq/L (ref 3.5–5.3)
Sodium: 140 mEq/L (ref 135–145)

## 2014-05-28 LAB — TSH: TSH: 1.669 u[IU]/mL (ref 0.350–4.500)

## 2014-05-28 LAB — CALCIUM, IONIZED: Calcium, Ion: 1.28 mmol/L (ref 1.12–1.32)

## 2014-05-28 NOTE — Assessment & Plan Note (Signed)
A1C 7.6 Glimepiride increased to 2 mg tid and minimize simploe carbs.
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
## 2014-05-28 NOTE — Assessment & Plan Note (Signed)
Doing well on MTX.  

## 2014-05-28 NOTE — Assessment & Plan Note (Signed)
Well controlled, no changes to meds. Encouraged heart healthy diet such as the DASH diet and exercise as tolerated.  °

## 2014-05-28 NOTE — Assessment & Plan Note (Signed)
Tolerating statin, encouraged heart healthy diet, avoid trans fats, minimize simple carbs and saturated fats. Increase exercise as tolerated. Labs reviewed

## 2014-05-28 NOTE — Assessment & Plan Note (Signed)
Persistent low back pain but stable improved some with increased Gabapentin given refill on this and Tramadol today

## 2014-05-28 NOTE — Assessment & Plan Note (Signed)
Avoid offending foods, start probiotics. Do not eat large meals in late evening and consider raising head of bed.  

## 2014-06-12 ENCOUNTER — Other Ambulatory Visit: Payer: Self-pay | Admitting: Family Medicine

## 2014-06-16 ENCOUNTER — Ambulatory Visit (INDEPENDENT_AMBULATORY_CARE_PROVIDER_SITE_OTHER): Payer: Medicare Other

## 2014-06-16 VITALS — BP 160/72 | HR 89 | Resp 12

## 2014-06-16 DIAGNOSIS — G609 Hereditary and idiopathic neuropathy, unspecified: Secondary | ICD-10-CM

## 2014-06-16 DIAGNOSIS — G629 Polyneuropathy, unspecified: Secondary | ICD-10-CM

## 2014-06-16 DIAGNOSIS — E1141 Type 2 diabetes mellitus with diabetic mononeuropathy: Secondary | ICD-10-CM

## 2014-06-16 DIAGNOSIS — E1142 Type 2 diabetes mellitus with diabetic polyneuropathy: Secondary | ICD-10-CM

## 2014-06-16 DIAGNOSIS — E1149 Type 2 diabetes mellitus with other diabetic neurological complication: Secondary | ICD-10-CM

## 2014-06-16 NOTE — Progress Notes (Signed)
   Subjective:    Patient ID: [name], male    DOB: Feb 21, 1946, 67 y.o.   MRN: [id number]  HPI patient presents at this time for followup both feet are still burning sensations did use a TENS unit one time helped temporarily but otherwise he continues to have difficulties has not been using it since that 1 treatment    Review of Systems no new findings or systemic changes no     Objective:   Physical Exam Neurovascular status is intact and unchanged patient has pedal pulses palpable DP +2/4 PT one over 4 bilateral significant neuritis and neuralgia both feet has been taking gabapentin 600 mg 3 times a day. At this time he tried TENS unit but did not continue using it. No open wounds ulcerations no changes in symptoms       Assessment & Plan:  Assessment persistent diabetic neuropathy and neuritis affecting both lower extremities advised this time to use a TENS unit twice daily as instructed and also continue with gabapentin recheck in 3 months for long-term followup also briefly discussed the possibility of an implantable nerve stimulators as alternative.  Harriet Masson DPM

## 2014-06-16 NOTE — Patient Instructions (Signed)
Peripheral Neuropathy Peripheral neuropathy is a type of nerve damage. It affects nerves that carry signals between the spinal cord and other parts of the body. These are called peripheral nerves. With peripheral neuropathy, one nerve or a group of nerves may be damaged.  CAUSES  Many things can damage peripheral nerves. For some people with peripheral neuropathy, the cause is unknown. Some causes include:  Diabetes. This is the most common cause of peripheral neuropathy.  Injury to a nerve.  Pressure or stress on a nerve that lasts a long time.  Too little vitamin B. Alcoholism can lead to this.  Infections.  Autoimmune diseases, such as multiple sclerosis and systemic lupus erythematosus.  Inherited nerve diseases.  Some medicines, such as cancer drugs.  Toxic substances, such as lead and mercury.  Too little blood flowing to the legs.  Kidney disease.  Thyroid disease. SIGNS AND SYMPTOMS  Different people have different symptoms. The symptoms you have will depend on which of your nerves is damaged. Common symptoms include:  Loss of feeling (numbness) in the feet and hands.  Tingling in the feet and hands.  Pain that burns.  Very sensitive skin.  Weakness.  Not being able to move a part of the body (paralysis).  Muscle twitching.  Clumsiness or poor coordination.  Loss of balance.  Not being able to control your bladder.  Feeling dizzy.  Sexual problems. DIAGNOSIS  Peripheral neuropathy is a symptom, not a disease. Finding the cause of peripheral neuropathy can be hard. To figure that out, your health care provider will take a medical history and do a physical exam. A neurological exam will also be done. This involves checking things affected by your brain, spinal cord, and nerves (nervous system). For example, your health care provider will check your reflexes, how you move, and what you can feel.  Other types of tests may also be ordered, such as:  Blood  tests.  A test of the fluid in your spinal cord.  Imaging tests, such as CT scans or an MRI.  Electromyography (EMG). This test checks the nerves that control muscles.  Nerve conduction velocity tests. These tests check how fast messages pass through your nerves.  Nerve biopsy. A small piece of nerve is removed. It is then checked under a microscope. TREATMENT   Medicine is often used to treat peripheral neuropathy. Medicines may include:  Pain-relieving medicines. Prescription or over-the-counter medicine may be suggested.  Antiseizure medicine. This may be used for pain.  Antidepressants. These also may help ease pain from neuropathy.  Lidocaine. This is a numbing medicine. You might wear a patch or be given a shot.  Mexiletine. This medicine is typically used to help control irregular heart rhythms.  Surgery. Surgery may be needed to relieve pressure on a nerve or to destroy a nerve that is causing pain.  Physical therapy to help movement.  Assistive devices to help movement. HOME CARE INSTRUCTIONS   Only take over-the-counter or prescription medicines as directed by your health care provider. Follow the instructions carefully for any given medicines. Do not take any other medicines without first getting approval from your health care provider.  If you have diabetes, work closely with your health care provider to keep your blood sugar under control.  If you have numbness in your feet:  Check every day for signs of injury or infection. Watch for redness, warmth, and swelling.  Wear padded socks and comfortable shoes. These help protect your feet.  Do not do   things that put pressure on your damaged nerve.  Do not smoke. Smoking keeps blood from getting to damaged nerves.  Avoid or limit alcohol. Too much alcohol can cause a lack of B vitamins. These vitamins are needed for healthy nerves.  Develop a good support system. Coping with peripheral neuropathy can be  stressful. Talk to a mental health specialist or join a support group if you are struggling.  Follow up with your health care provider as directed. SEEK MEDICAL CARE IF:   You have new signs or symptoms of peripheral neuropathy.  You are struggling emotionally from dealing with peripheral neuropathy.  You have a fever. SEEK IMMEDIATE MEDICAL CARE IF:   You have an injury or infection that is not healing.  You feel very dizzy or begin vomiting.  You have chest pain.  You have trouble breathing. Document Released: 10/12/2002 Document Revised: 07/04/2011 Document Reviewed: 06/29/2013 [facility] Patient Information 2015 [place], [place]. This information is not intended to replace advice given to you by your health care provider. Make sure you discuss any questions you have with your health care provider.    Recommendation is to continue with current dose of gabapentin 3 times daily. Also start using TENS unit once or twice daily for 20 minutes to 30 minute as instructed. Do this on a consistent basis for the next several months, this may help improve or reduce the nerve symptoms

## 2014-06-27 ENCOUNTER — Other Ambulatory Visit: Payer: Self-pay | Admitting: Family Medicine

## 2014-07-25 ENCOUNTER — Other Ambulatory Visit: Payer: Self-pay | Admitting: Family Medicine

## 2014-08-22 ENCOUNTER — Other Ambulatory Visit: Payer: Self-pay | Admitting: Physician Assistant

## 2014-08-23 NOTE — Telephone Encounter (Signed)
Rx request to pharmacy/[facility]  

## 2014-08-24 ENCOUNTER — Other Ambulatory Visit: Payer: Self-pay | Admitting: Physician Assistant

## 2014-09-03 NOTE — Telephone Encounter (Signed)
A user error has taken place: encounter opened in error, closed for administrative reasons.

## 2014-09-21 ENCOUNTER — Other Ambulatory Visit: Payer: Self-pay | Admitting: Family Medicine

## 2014-09-28 ENCOUNTER — Other Ambulatory Visit: Payer: Self-pay

## 2014-09-28 ENCOUNTER — Ambulatory Visit (INDEPENDENT_AMBULATORY_CARE_PROVIDER_SITE_OTHER): Payer: Medicare Other

## 2014-09-28 VITALS — BP 126/61 | HR 95 | Resp 18

## 2014-09-28 DIAGNOSIS — I10 Essential (primary) hypertension: Secondary | ICD-10-CM

## 2014-09-28 DIAGNOSIS — E1141 Type 2 diabetes mellitus with diabetic mononeuropathy: Secondary | ICD-10-CM

## 2014-09-28 DIAGNOSIS — E1149 Type 2 diabetes mellitus with other diabetic neurological complication: Secondary | ICD-10-CM

## 2014-09-28 DIAGNOSIS — Q828 Other specified congenital malformations of skin: Secondary | ICD-10-CM

## 2014-09-28 DIAGNOSIS — G629 Polyneuropathy, unspecified: Secondary | ICD-10-CM

## 2014-09-28 DIAGNOSIS — M79673 Pain in unspecified foot: Secondary | ICD-10-CM

## 2014-09-28 DIAGNOSIS — E1140 Type 2 diabetes mellitus with diabetic neuropathy, unspecified: Secondary | ICD-10-CM

## 2014-09-28 MED ORDER — LOSARTAN POTASSIUM 50 MG PO TABS: 50.0000 mg | ORAL_TABLET | Freq: Every day | ORAL | Status: DC

## 2014-09-28 NOTE — Progress Notes (Signed)
   Subjective:    Patient ID: [name], male    DOB: September 04, 1946, 68 y.o.   MRN: [id number]  HPI both of my feet are better than they were    Review of Systems no new findings or systemic changes noted     Objective:   Physical Exam Neurovascular status unchanged patient has pedal pulses palpable DP +2 PT 1 over 4 bilateral continues to have paresthesias still taking 6 gabapentin daily divided doses has been using the TENS unit periodically with improvement and success. No open wounds or ulcers the patient does have pinch callusing and keratoses of the hallux IP joints bilateral which DD debridement at this time is irrigating painful symptomatic and pre-ulcerative. Nails thick criptotic and brittle patient is doing his own nail care and will continue to do so at this time. Orthopedic exam unremarkable mild flexible digital contractures are noted mild HAV deformity noted.       Assessment & Plan:  Assessment this time his diabetes with history peripheral neuropathy and neuralgia which is managed with open gabapentin and the use of the electronic stimulator TENS unit. Patient will continue use these did ask about getting a new TENS unit I suggested recheck and [employer] to you purchase one by himself if not may provide a prescription was plantar physical therapy for new TENS unit in the future if needed however currently the one he has although may be 68 or 68 years old is still working adequately. At this time keratoses bilateral hallux are debrided pre-ulcerative hemorrhage a keratoses follow-up within the next 2 months for possible diabetic shoe measurement fitting we'll obtain authorization from his primary physician Dr. Maryellen Pile. Follow-up in 2 months for shoe measurements when ready  Harriet Masson DPM

## 2014-09-28 NOTE — Patient Instructions (Signed)
Diabetes and Foot Care Diabetes may cause you to have problems because of poor blood supply (circulation) to your feet and legs. This may cause the skin on your feet to become thinner, break easier, and heal more slowly. Your skin may become dry, and the skin may peel and crack. You may also have nerve damage in your legs and feet causing decreased feeling in them. You may not notice minor injuries to your feet that could lead to infections or more serious problems. Taking care of your feet is one of the most important things you can do for yourself.  HOME CARE INSTRUCTIONS  Wear shoes at all times, even in the house. Do not go barefoot. Bare feet are easily injured.  Check your feet daily for blisters, cuts, and redness. If you cannot see the bottom of your feet, use a mirror or ask someone for help.  Wash your feet with warm water (do not use hot water) and mild soap. Then pat your feet and the areas between your toes until they are completely dry. Do not soak your feet as this can dry your skin.  Apply a moisturizing lotion or petroleum jelly (that does not contain alcohol and is unscented) to the skin on your feet and to dry, brittle toenails. Do not apply lotion between your toes.  Trim your toenails straight across. Do not dig under them or around the cuticle. File the edges of your nails with an emery board or nail file.  Do not cut corns or calluses or try to remove them with medicine.  Wear clean socks or stockings every day. Make sure they are not too tight. Do not wear knee-high stockings since they may decrease blood flow to your legs.  Wear shoes that fit properly and have enough cushioning. To break in new shoes, wear them for just a few hours a day. This prevents you from injuring your feet. Always look in your shoes before you put them on to be sure there are no objects inside.  Do not cross your legs. This may decrease the blood flow to your feet.  If you find a minor scrape,  cut, or break in the skin on your feet, keep it and the skin around it clean and dry. These areas may be cleansed with mild soap and water. Do not cleanse the area with peroxide, alcohol, or iodine.  When you remove an adhesive bandage, be sure not to damage the skin around it.  If you have a wound, look at it several times a day to make sure it is healing.  Do not use heating pads or hot water bottles. They may burn your skin. If you have lost feeling in your feet or legs, you may not know it is happening until it is too late.  Make sure your health care provider performs a complete foot exam at least annually or more often if you have foot problems. Report any cuts, sores, or bruises to your health care provider immediately. SEEK MEDICAL CARE IF:   You have an injury that is not healing.  You have cuts or breaks in the skin.  You have an ingrown nail.  You notice redness on your legs or feet.  You feel burning or tingling in your legs or feet.  You have pain or cramps in your legs and feet.  Your legs or feet are numb.  Your feet always feel cold. SEEK IMMEDIATE MEDICAL CARE IF:   There is increasing redness,   swelling, or pain in or around a wound.  There is a red line that goes up your leg.  Pus is coming from a wound.  You develop a fever or as directed by your health care provider.  You notice a bad smell coming from an ulcer or wound. Document Released: 10/19/2000 Document Revised: 06/24/2013 Document Reviewed: 03/31/2013 Exit[employer] Patient Information 2015 [employer]. This information is not intended to replace advice given to you by your health care provider. Make sure you discuss any questions you have with your health care provider.  

## 2014-10-05 ENCOUNTER — Other Ambulatory Visit: Payer: Self-pay | Admitting: Family Medicine

## 2014-10-05 NOTE — Telephone Encounter (Signed)
Last RX wrote on 04-12-14 quantity 90 with 1 refill

## 2014-10-07 ENCOUNTER — Telehealth: Payer: Self-pay | Admitting: *Deleted

## 2014-10-07 NOTE — Telephone Encounter (Signed)
Received order from [facility]: diabetic shoes/therapuetic footwear. Forwarded to [name]. JG//CMA

## 2014-10-11 ENCOUNTER — Ambulatory Visit: Payer: Medicare Other | Admitting: Family Medicine

## 2014-10-11 ENCOUNTER — Ambulatory Visit (INDEPENDENT_AMBULATORY_CARE_PROVIDER_SITE_OTHER): Payer: Medicare Other | Admitting: Family Medicine

## 2014-10-11 ENCOUNTER — Ambulatory Visit (INDEPENDENT_AMBULATORY_CARE_PROVIDER_SITE_OTHER): Payer: Medicare Other

## 2014-10-11 ENCOUNTER — Encounter: Payer: Self-pay | Admitting: Family Medicine

## 2014-10-11 VITALS — BP 132/75 | HR 88 | Temp 98.0°F | Ht 69.0 in | Wt 244.0 lb

## 2014-10-11 DIAGNOSIS — K219 Gastro-esophageal reflux disease without esophagitis: Secondary | ICD-10-CM

## 2014-10-11 DIAGNOSIS — E669 Obesity, unspecified: Secondary | ICD-10-CM

## 2014-10-11 DIAGNOSIS — I10 Essential (primary) hypertension: Secondary | ICD-10-CM

## 2014-10-11 DIAGNOSIS — G4700 Insomnia, unspecified: Secondary | ICD-10-CM

## 2014-10-11 DIAGNOSIS — E119 Type 2 diabetes mellitus without complications: Secondary | ICD-10-CM

## 2014-10-11 DIAGNOSIS — E785 Hyperlipidemia, unspecified: Secondary | ICD-10-CM

## 2014-10-11 DIAGNOSIS — F4321 Adjustment disorder with depressed mood: Secondary | ICD-10-CM

## 2014-10-11 DIAGNOSIS — Z23 Encounter for immunization: Secondary | ICD-10-CM

## 2014-10-11 DIAGNOSIS — E1140 Type 2 diabetes mellitus with diabetic neuropathy, unspecified: Secondary | ICD-10-CM

## 2014-10-11 DIAGNOSIS — G609 Hereditary and idiopathic neuropathy, unspecified: Secondary | ICD-10-CM

## 2014-10-11 DIAGNOSIS — N289 Disorder of kidney and ureter, unspecified: Secondary | ICD-10-CM

## 2014-10-11 DIAGNOSIS — M5126 Other intervertebral disc displacement, lumbar region: Secondary | ICD-10-CM

## 2014-10-11 DIAGNOSIS — E1169 Type 2 diabetes mellitus with other specified complication: Secondary | ICD-10-CM

## 2014-10-11 DIAGNOSIS — E1149 Type 2 diabetes mellitus with other diabetic neurological complication: Secondary | ICD-10-CM

## 2014-10-11 MED ORDER — ALPRAZOLAM 0.25 MG PO TABS: 0.2500 mg | ORAL_TABLET | Freq: Two times a day (BID) | ORAL | Status: DC | PRN

## 2014-10-11 NOTE — Progress Notes (Signed)
Pre visit review using our clinic review tool, if applicable. No additional management support is needed unless otherwise documented below in the visit note. 

## 2014-10-11 NOTE — Patient Instructions (Signed)
No blue glowing lights 1 hour prior to bed. Try OTC Melatonin 2 to 10 mg at bed for sleep  Insomnia Insomnia is frequent trouble falling and/or staying asleep. Insomnia can be a long term problem or a short term problem. Both are common. Insomnia can be a short term problem when the wakefulness is related to a certain stress or worry. Long term insomnia is often related to ongoing stress during waking hours and/or poor sleeping habits. Overtime, sleep deprivation itself can make the problem worse. Every little thing feels more severe because you are overtired and your ability to cope is decreased. CAUSES   Stress, anxiety, and depression.  Poor sleeping habits.  Distractions such as TV in the bedroom.  Naps close to bedtime.  Engaging in emotionally charged conversations before bed.  Technical reading before sleep.  Alcohol and other sedatives. They may make the problem worse. They can hurt normal sleep patterns and normal dream activity.  Stimulants such as caffeine for several hours prior to bedtime.  Pain syndromes and shortness of breath can cause insomnia.  Exercise late at night.  Changing time zones may cause sleeping problems (jet lag). It is sometimes helpful to have someone observe your sleeping patterns. They should look for periods of not breathing during the night (sleep apnea). They should also look to see how long those periods last. If you live alone or observers are uncertain, you can also be observed at a sleep clinic where your sleep patterns will be professionally monitored. Sleep apnea requires a checkup and treatment. Give your caregivers your medical history. Give your caregivers observations your family has made about your sleep.  SYMPTOMS   Not feeling rested in the morning.  Anxiety and restlessness at bedtime.  Difficulty falling and staying asleep. TREATMENT   Your caregiver may prescribe treatment for an underlying medical disorders. Your caregiver  can give advice or help if you are using alcohol or other drugs for self-medication. Treatment of underlying problems will usually eliminate insomnia problems.  Medications can be prescribed for short time use. They are generally not recommended for lengthy use.  Over-the-counter sleep medicines are not recommended for lengthy use. They can be habit forming.  You can promote easier sleeping by making lifestyle changes such as:  Using relaxation techniques that help with breathing and reduce muscle tension.  Exercising earlier in the day.  Changing your diet and the time of your last meal. No night time snacks.  Establish a regular time to go to bed.  Counseling can help with stressful problems and worry.  Soothing music and white noise may be helpful if there are background noises you cannot remove.  Stop tedious detailed work at least one hour before bedtime. HOME CARE INSTRUCTIONS   Keep a diary. Inform your caregiver about your progress. This includes any medication side effects. See your caregiver regularly. Take note of:  Times when you are asleep.  Times when you are awake during the night.  The quality of your sleep.  How you feel the next day. This information will help your caregiver care for you.  Get out of bed if you are still awake after 15 minutes. Read or do some quiet activity. Keep the lights down. Wait until you feel sleepy and go back to bed.  Keep regular sleeping and waking hours. Avoid naps.  Exercise regularly.  Avoid distractions at bedtime. Distractions include watching television or engaging in any intense or detailed activity like attempting to balance the household  checkbook.  Develop a bedtime ritual. Keep a familiar routine of bathing, brushing your teeth, climbing into bed at the same time each night, listening to soothing music. Routines increase the success of falling to sleep faster.  Use relaxation techniques. This can be using breathing  and muscle tension release routines. It can also include visualizing peaceful scenes. You can also help control troubling or intruding thoughts by keeping your mind occupied with boring or repetitive thoughts like the old concept of counting sheep. You can make it more creative like imagining planting one beautiful flower after another in your backyard garden.  During your day, work to eliminate stress. When this is not possible use some of the previous suggestions to help reduce the anxiety that accompanies stressful situations. MAKE SURE YOU:   Understand these instructions.  Will watch your condition.  Will get help right away if you are not doing well or get worse. Document Released: 10/19/2000 Document Revised: 01/14/2012 Document Reviewed: 11/19/2007 [facility] Patient Information 2015 [place], [place]. This information is not intended to replace advice given to you by your health care provider. Make sure you discuss any questions you have with your health care provider.

## 2014-10-12 ENCOUNTER — Encounter: Payer: Self-pay | Admitting: Family Medicine

## 2014-10-12 ENCOUNTER — Telehealth: Payer: Self-pay | Admitting: Family Medicine

## 2014-10-12 LAB — RENAL FUNCTION PANEL
Albumin: 4.3 g/dL (ref 3.5–5.2)
BUN: 11 mg/dL (ref 6–23)
CO2: 29 mEq/L (ref 19–32)
Calcium: 9.3 mg/dL (ref 8.4–10.5)
Chloride: 99 mEq/L (ref 96–112)
Creatinine, Ser: 0.8 mg/dL (ref 0.4–1.5)
GFR: 96.83 mL/min (ref >60.00)
Glucose, Bld: 99 mg/dL (ref 70–99)
Phosphorus: 3.1 mg/dL (ref 2.3–4.6)
Potassium: 3.7 mEq/L (ref 3.5–5.1)
Sodium: 138 mEq/L (ref 135–145)

## 2014-10-12 LAB — HEPATIC FUNCTION PANEL
ALT: 28 U/L (ref 0–53)
AST: 28 U/L (ref 0–37)
Albumin: 4.3 g/dL (ref 3.5–5.2)
Alkaline Phosphatase: 80 U/L (ref 39–117)
Bilirubin, Direct: 0.1 mg/dL (ref 0.0–0.3)
Total Bilirubin: 0.8 mg/dL (ref 0.2–1.2)
Total Protein: 7.3 g/dL (ref 6.0–8.3)

## 2014-10-12 LAB — CBC
HCT: 42 % (ref 39.0–52.0)
Hemoglobin: 13.9 g/dL (ref 13.0–17.0)
MCHC: 33.2 g/dL (ref 30.0–36.0)
MCV: 98.3 fl (ref 78.0–100.0)
Platelets: 254 10*3/uL (ref 150.0–400.0)
RBC: 4.27 Mil/uL (ref 4.22–5.81)
RDW: 14.5 % (ref 11.5–15.5)
WBC: 10.8 10*3/uL — ABNORMAL HIGH (ref 4.0–10.5)

## 2014-10-12 LAB — LIPID PANEL
Cholesterol: 132 mg/dL (ref 0–200)
HDL: 28.7 mg/dL — ABNORMAL LOW (ref >39.00)
LDL Cholesterol: 74 mg/dL (ref 0–99)
NonHDL: 103.3
Total CHOL/HDL Ratio: 5
Triglycerides: 149 mg/dL (ref 0.0–149.0)
VLDL: 29.8 mg/dL (ref 0.0–40.0)

## 2014-10-12 LAB — HEMOGLOBIN A1C: Hgb A1c MFr Bld: 8.5 % — ABNORMAL HIGH (ref 4.6–6.5)

## 2014-10-12 LAB — TSH: TSH: 3.27 u[IU]/mL (ref 0.35–4.50)

## 2014-10-12 MED ORDER — METFORMIN HCL 1000 MG PO TABS: 1000.0000 mg | ORAL_TABLET | Freq: Two times a day (BID) | ORAL | Status: DC

## 2014-10-12 NOTE — Telephone Encounter (Signed)
Pt inquiring about results from 10/11/2014 last OV, please advise. Please call mobile

## 2014-10-12 NOTE — Telephone Encounter (Signed)
Look at lab note

## 2014-10-17 ENCOUNTER — Other Ambulatory Visit: Payer: Self-pay | Admitting: Family Medicine

## 2014-10-17 ENCOUNTER — Encounter: Payer: Self-pay | Admitting: Family Medicine

## 2014-10-17 DIAGNOSIS — G609 Hereditary and idiopathic neuropathy, unspecified: Secondary | ICD-10-CM

## 2014-10-17 HISTORY — DX: Hereditary and idiopathic neuropathy, unspecified: G60.9

## 2014-10-17 NOTE — Assessment & Plan Note (Signed)
Mild, stable, continue adequate hydration.

## 2014-10-17 NOTE — Assessment & Plan Note (Signed)
Avoid offending foods, start probiotics. Do not eat large meals in late evening and consider raising head of bed.  

## 2014-10-17 NOTE — Assessment & Plan Note (Addendum)
Well controlled, no changes to meds. Encouraged heart healthy diet such as the DASH diet and exercise as tolerated.  °

## 2014-10-17 NOTE — Assessment & Plan Note (Signed)
Tolerating statin, encouraged heart healthy diet, avoid trans fats, minimize simple carbs and saturated fats. Increase exercise as tolerated 

## 2014-10-17 NOTE — Assessment & Plan Note (Signed)
Continue Gabapentin tid and minimize blood sugars.
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
## 2014-10-17 NOTE — Assessment & Plan Note (Signed)
With left hip pain has been noting persistent pain, does get some relief from [place] and/or Tramadol. Will continue to follow with [facility]

## 2014-10-17 NOTE — Assessment & Plan Note (Signed)
HGBA1C is up some. Minimize simple carbs and increase exercise. Increase Metformin to bid

## 2014-10-27 NOTE — Telephone Encounter (Signed)
Signed order faxed successfully to [facility] at [phone]. JG//CMA

## 2014-11-18 ENCOUNTER — Other Ambulatory Visit: Payer: Self-pay | Admitting: Family Medicine

## 2014-11-19 NOTE — Telephone Encounter (Signed)
Requesting Gabapentin 300mg -Take 2 capsules by mouth 3 times a day. Last refill:05/27/14;#540,1 Last OV:10/11/14 Please advise.//AB/CMA

## 2014-11-23 ENCOUNTER — Ambulatory Visit (INDEPENDENT_AMBULATORY_CARE_PROVIDER_SITE_OTHER): Payer: Medicare Other

## 2014-11-23 VITALS — BP 148/80 | HR 80 | Resp 12

## 2014-11-23 DIAGNOSIS — G629 Polyneuropathy, unspecified: Secondary | ICD-10-CM

## 2014-11-23 DIAGNOSIS — M2040 Other hammer toe(s) (acquired), unspecified foot: Secondary | ICD-10-CM

## 2014-11-23 DIAGNOSIS — E1149 Type 2 diabetes mellitus with other diabetic neurological complication: Secondary | ICD-10-CM

## 2014-11-23 DIAGNOSIS — E1140 Type 2 diabetes mellitus with diabetic neuropathy, unspecified: Secondary | ICD-10-CM

## 2014-11-23 DIAGNOSIS — E1141 Type 2 diabetes mellitus with diabetic mononeuropathy: Secondary | ICD-10-CM

## 2014-11-23 DIAGNOSIS — M79676 Pain in unspecified toe(s): Secondary | ICD-10-CM

## 2014-11-23 DIAGNOSIS — Q828 Other specified congenital malformations of skin: Secondary | ICD-10-CM

## 2014-11-23 NOTE — Progress Notes (Signed)
   Subjective:    Patient ID: [name], male    DOB: 1946/03/20, 69 y.o.   MRN: [id number]  HPI    Review of Systems no new findings or systemic changes noted    Objective:   Physical Exam Vascular status unchanged pedal pulses DP +2 PT 1 over 4 bilateral Refill time 3 seconds significant paresthesias are noted patient still taking gabapentin as instructed has dry scaling skin with keratoses pinch callus first toe IP joint left great toe. No open wounds no ulcers patient is been doing self-care debridement of his nails in the past. Secondary infections       Assessment & Plan:  Assessment this time is diabetes history peripheral neuropathy and complications patient multiple sale keratotic lesion pinch callus first MTP or IP joint left foot is debrided at this time maintain cream or lotion to his feet twice daily as recommended return in 3 months for continued palliative care in the future as needed next  [employer] DPM

## 2014-11-24 ENCOUNTER — Other Ambulatory Visit: Payer: Self-pay | Admitting: Family Medicine

## 2014-11-24 NOTE — Telephone Encounter (Signed)
Caller name: Noe Relation to pt: self Call back number: [phone] Pharmacy: [facility]  Reason for call:   Needs refills of all medications sent. Main pharmacy is now [facility].

## 2014-11-25 ENCOUNTER — Other Ambulatory Visit: Payer: Self-pay

## 2014-11-25 DIAGNOSIS — I10 Essential (primary) hypertension: Secondary | ICD-10-CM

## 2014-11-25 MED ORDER — BUDESONIDE-FORMOTEROL FUMARATE 160-4.5 MCG/ACT IN AERO: 2.0000 | INHALATION_SPRAY | Freq: Two times a day (BID) | RESPIRATORY_TRACT | Status: DC | PRN

## 2014-11-25 MED ORDER — GABAPENTIN 300 MG PO CAPS: 600.0000 mg | ORAL_CAPSULE | Freq: Three times a day (TID) | ORAL | Status: DC

## 2014-11-25 MED ORDER — AMLODIPINE BESYLATE 5 MG PO TABS: 5.0000 mg | ORAL_TABLET | Freq: Every day | ORAL | Status: DC

## 2014-11-25 MED ORDER — HYDROCHLOROTHIAZIDE 12.5 MG PO CAPS: 12.5000 mg | ORAL_CAPSULE | Freq: Every day | ORAL | Status: DC

## 2014-11-25 MED ORDER — LOSARTAN POTASSIUM 50 MG PO TABS: 50.0000 mg | ORAL_TABLET | Freq: Every day | ORAL | Status: DC

## 2014-11-25 MED ORDER — GLIMEPIRIDE 2 MG PO TABS: ORAL_TABLET | ORAL | Status: DC

## 2014-11-25 MED ORDER — CARVEDILOL 25 MG PO TABS: ORAL_TABLET | ORAL | Status: DC

## 2014-11-25 MED ORDER — CLONIDINE HCL 0.2 MG PO TABS: 0.1000 mg | ORAL_TABLET | Freq: Two times a day (BID) | ORAL | Status: DC

## 2014-11-25 MED ORDER — METFORMIN HCL 1000 MG PO TABS: 1000.0000 mg | ORAL_TABLET | Freq: Two times a day (BID) | ORAL | Status: DC

## 2014-11-25 MED ORDER — ATORVASTATIN CALCIUM 80 MG PO TABS: ORAL_TABLET | ORAL | Status: DC

## 2014-11-25 NOTE — Telephone Encounter (Signed)
All medications refilled # 90 and 1 RF sent to [facility].

## 2014-12-21 ENCOUNTER — Ambulatory Visit (INDEPENDENT_AMBULATORY_CARE_PROVIDER_SITE_OTHER): Payer: Medicare Other

## 2014-12-21 DIAGNOSIS — E1140 Type 2 diabetes mellitus with diabetic neuropathy, unspecified: Secondary | ICD-10-CM | POA: Diagnosis not present

## 2014-12-21 DIAGNOSIS — E1149 Type 2 diabetes mellitus with other diabetic neurological complication: Secondary | ICD-10-CM

## 2014-12-21 DIAGNOSIS — G629 Polyneuropathy, unspecified: Secondary | ICD-10-CM

## 2014-12-21 DIAGNOSIS — M2040 Other hammer toe(s) (acquired), unspecified foot: Secondary | ICD-10-CM | POA: Diagnosis not present

## 2014-12-21 DIAGNOSIS — E1141 Type 2 diabetes mellitus with diabetic mononeuropathy: Secondary | ICD-10-CM

## 2014-12-21 DIAGNOSIS — Q828 Other specified congenital malformations of skin: Secondary | ICD-10-CM

## 2014-12-21 DIAGNOSIS — M79673 Pain in unspecified foot: Secondary | ICD-10-CM

## 2014-12-21 NOTE — Progress Notes (Signed)
   Subjective:    Patient ID: [name], male    DOB: 12-Oct-1946, 69 y.o.   MRN: [id number]  HPI  PUO AND GIVEN INSTRUCTION.  Review of Systems     Objective:   Physical Exam Neurovascular status intact DP +2 PT plus one over 4 bilateral Refill time 3 seconds no other new changes mild digital contractures associated keratoses noted no open wounds no ulcers no secondary infections at this time patient presents for fitting of shoes one pair of diabetic extra-depth shoes and 3 pairs of custom molded dual density Plastizote inlays are dispensed. The shoes and inlays fit and contour well to the foot the orthotics have full contact to the entire foot and arch. As for shoewear and break in are given to the patient at this time. Patient advised to continue with the Reglan diabetic foot care and maintenance.       Assessment & Plan:  Assessment diabetes with history of peripheral neuropathy and complications of keratoses contractures and diabetic neuropathy. Patient does have risk factors for ulceration or complications therefore diabetic shoes without are beneficial and preventing [name] complications. Shoes are dispensed with instructions given follow-up with in 2 months for regular diabetic foot palliative nail care is needed  Harriet Masson DPM

## 2014-12-21 NOTE — Patient Instructions (Signed)

## 2014-12-23 ENCOUNTER — Other Ambulatory Visit: Payer: Self-pay | Admitting: Family Medicine

## 2014-12-24 NOTE — Telephone Encounter (Signed)
Faxed hardcopy for Tramadol to [facility]

## 2015-01-16 ENCOUNTER — Other Ambulatory Visit: Payer: Self-pay | Admitting: Family Medicine

## 2015-01-19 ENCOUNTER — Other Ambulatory Visit: Payer: Self-pay | Admitting: Family Medicine

## 2015-01-21 ENCOUNTER — Other Ambulatory Visit: Payer: Self-pay | Admitting: Family Medicine

## 2015-01-21 MED ORDER — TRAMADOL HCL 50 MG PO TABS: ORAL_TABLET | ORAL | Status: DC

## 2015-01-21 NOTE — Telephone Encounter (Signed)
Refill done per patient request and PCP instructions.

## 2015-02-04 ENCOUNTER — Encounter: Payer: Self-pay | Admitting: Family Medicine

## 2015-02-07 NOTE — Telephone Encounter (Signed)
Regarding Tramadol- Patient would like #[phone] sent to [facility]- he states this dose would suffice and it is cheaper this way.   OK to send?

## 2015-02-08 NOTE — Telephone Encounter (Signed)
Non-Urgent Medical Question  02/04/2015 11:08 AM Reply  To: [facility] CLINICAL POOL   From: [name]   Created: 02/04/2015 11:08 AM    *-*-*This message has not been handled.*-*-*   [name], [facility] would like you to call them to verify my medicines please.  Thank you, [name] DOB April 27, 2046   [facility]  [facility] and [facility].  Patient notified.

## 2015-02-10 ENCOUNTER — Ambulatory Visit (INDEPENDENT_AMBULATORY_CARE_PROVIDER_SITE_OTHER): Payer: Medicare Other | Admitting: Family Medicine

## 2015-02-10 ENCOUNTER — Encounter: Payer: Self-pay | Admitting: Family Medicine

## 2015-02-10 VITALS — BP 136/74 | HR 83 | Temp 98.0°F | Resp 16 | Wt 247.2 lb

## 2015-02-10 DIAGNOSIS — E669 Obesity, unspecified: Secondary | ICD-10-CM | POA: Diagnosis not present

## 2015-02-10 DIAGNOSIS — R0600 Dyspnea, unspecified: Secondary | ICD-10-CM | POA: Diagnosis not present

## 2015-02-10 DIAGNOSIS — E785 Hyperlipidemia, unspecified: Secondary | ICD-10-CM

## 2015-02-10 DIAGNOSIS — E1140 Type 2 diabetes mellitus with diabetic neuropathy, unspecified: Secondary | ICD-10-CM

## 2015-02-10 DIAGNOSIS — I10 Essential (primary) hypertension: Secondary | ICD-10-CM

## 2015-02-10 DIAGNOSIS — E119 Type 2 diabetes mellitus without complications: Secondary | ICD-10-CM

## 2015-02-10 DIAGNOSIS — E1149 Type 2 diabetes mellitus with other diabetic neurological complication: Secondary | ICD-10-CM

## 2015-02-10 DIAGNOSIS — K219 Gastro-esophageal reflux disease without esophagitis: Secondary | ICD-10-CM

## 2015-02-10 DIAGNOSIS — E1169 Type 2 diabetes mellitus with other specified complication: Secondary | ICD-10-CM

## 2015-02-10 NOTE — Patient Instructions (Signed)

## 2015-02-10 NOTE — Progress Notes (Signed)
Pre visit review using our clinic review tool, if applicable. No additional management support is needed unless otherwise documented below in the visit note. 

## 2015-02-11 LAB — LIPID PANEL
Cholesterol: 143 mg/dL (ref 0–200)
HDL: 33.4 mg/dL — ABNORMAL LOW (ref >39.00)
LDL Cholesterol: 72 mg/dL (ref 0–99)
NonHDL: 109.6
Total CHOL/HDL Ratio: 4
Triglycerides: 187 mg/dL — ABNORMAL HIGH (ref 0.0–149.0)
VLDL: 37.4 mg/dL (ref 0.0–40.0)

## 2015-02-11 LAB — CBC
HCT: 41.2 % (ref 39.0–52.0)
Hemoglobin: 13.9 g/dL (ref 13.0–17.0)
MCHC: 33.8 g/dL (ref 30.0–36.0)
MCV: 96 fl (ref 78.0–100.0)
Platelets: 245 10*3/uL (ref 150.0–400.0)
RBC: 4.29 Mil/uL (ref 4.22–5.81)
RDW: 14.5 % (ref 11.5–15.5)
WBC: 10.8 10*3/uL — ABNORMAL HIGH (ref 4.0–10.5)

## 2015-02-11 LAB — COMPREHENSIVE METABOLIC PANEL
ALT: 24 U/L (ref 0–53)
AST: 24 U/L (ref 0–37)
Albumin: 4.3 g/dL (ref 3.5–5.2)
Alkaline Phosphatase: 83 U/L (ref 39–117)
BUN: 13 mg/dL (ref 6–23)
CO2: 30 mEq/L (ref 19–32)
Calcium: 9.5 mg/dL (ref 8.4–10.5)
Chloride: 98 mEq/L (ref 96–112)
Creatinine, Ser: 0.8 mg/dL (ref 0.40–1.50)
GFR: 102.33 mL/min (ref >60.00)
Glucose, Bld: 171 mg/dL — ABNORMAL HIGH (ref 70–99)
Potassium: 4.1 mEq/L (ref 3.5–5.1)
Sodium: 137 mEq/L (ref 135–145)
Total Bilirubin: 0.3 mg/dL (ref 0.2–1.2)
Total Protein: 7.4 g/dL (ref 6.0–8.3)

## 2015-02-11 LAB — HEMOGLOBIN A1C: Hgb A1c MFr Bld: 7.6 % — ABNORMAL HIGH (ref 4.6–6.5)

## 2015-02-11 LAB — TSH: TSH: 3.94 u[IU]/mL (ref 0.35–4.50)

## 2015-02-13 ENCOUNTER — Encounter: Payer: Self-pay | Admitting: Family Medicine

## 2015-02-13 NOTE — Assessment & Plan Note (Signed)
Well controlled, no changes to meds. Encouraged heart healthy diet such as the DASH diet and exercise as tolerated.  °
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
## 2015-02-14 NOTE — Assessment & Plan Note (Signed)
hgba1c improving, minimize simple carbs. Increase exercise as tolerated. Continue current meds but increase dose of Metformin

## 2015-02-14 NOTE — Assessment & Plan Note (Signed)
Avoid offending foods, start probiotics. Do not eat large meals in late evening and consider raising head of bed.  

## 2015-02-14 NOTE — Assessment & Plan Note (Signed)
Encouraged heart healthy diet, increase exercise, avoid trans fats, consider a krill oil cap daily. Tolerating Atorvastatin 

## 2015-02-16 ENCOUNTER — Ambulatory Visit (HOSPITAL_BASED_OUTPATIENT_CLINIC_OR_DEPARTMENT_OTHER)
Admission: RE | Admit: 2015-02-16 | Discharge: 2015-02-16 | Disposition: A | Discharge status: Home / Self Care | Payer: Medicare Other | Source: Ambulatory Visit | Attending: Family Medicine | Admitting: Family Medicine

## 2015-02-16 DIAGNOSIS — E785 Hyperlipidemia, unspecified: Secondary | ICD-10-CM | POA: Diagnosis not present

## 2015-02-16 DIAGNOSIS — E669 Obesity, unspecified: Secondary | ICD-10-CM

## 2015-02-16 DIAGNOSIS — R0600 Dyspnea, unspecified: Secondary | ICD-10-CM | POA: Insufficient documentation

## 2015-02-16 DIAGNOSIS — I10 Essential (primary) hypertension: Secondary | ICD-10-CM | POA: Insufficient documentation

## 2015-02-16 DIAGNOSIS — E119 Type 2 diabetes mellitus without complications: Secondary | ICD-10-CM | POA: Insufficient documentation

## 2015-02-16 DIAGNOSIS — E1169 Type 2 diabetes mellitus with other specified complication: Secondary | ICD-10-CM

## 2015-02-16 MED ORDER — PERFLUTREN LIPID MICROSPHERE
1.0000 mL | INTRAVENOUS | Status: AC | PRN
  Administered 2015-02-16: 6 mL via INTRAVENOUS
  Filled 2015-02-16: qty 10

## 2015-02-16 NOTE — Progress Notes (Signed)
Echocardiogram 2D Echocardiogram with contrast has been performed.  [name], [name] 02/16/2015, 2:08 PM

## 2015-02-17 ENCOUNTER — Ambulatory Visit (INDEPENDENT_AMBULATORY_CARE_PROVIDER_SITE_OTHER): Payer: Medicare Other | Admitting: Emergency Medicine

## 2015-02-17 ENCOUNTER — Institutional Professional Consult (permissible substitution): Payer: Medicare Other | Admitting: Emergency Medicine

## 2015-02-17 ENCOUNTER — Encounter: Payer: Self-pay | Admitting: Emergency Medicine

## 2015-02-17 VITALS — BP 134/74 | HR 85 | Ht 69.0 in | Wt 242.0 lb

## 2015-02-17 DIAGNOSIS — R0609 Other forms of dyspnea: Secondary | ICD-10-CM

## 2015-02-17 DIAGNOSIS — J441 Chronic obstructive pulmonary disease with (acute) exacerbation: Secondary | ICD-10-CM | POA: Insufficient documentation

## 2015-02-17 DIAGNOSIS — R0600 Dyspnea, unspecified: Secondary | ICD-10-CM

## 2015-02-17 DIAGNOSIS — J449 Chronic obstructive pulmonary disease, unspecified: Secondary | ICD-10-CM | POA: Insufficient documentation

## 2015-02-17 HISTORY — DX: Chronic obstructive pulmonary disease, unspecified: J44.9

## 2015-02-17 MED ORDER — TIOTROPIUM BROMIDE MONOHYDRATE 1.25 MCG/ACT IN AERS: 2.0000 | INHALATION_SPRAY | Freq: Every day | RESPIRATORY_TRACT | Status: DC

## 2015-02-17 MED ORDER — ALBUTEROL SULFATE HFA 108 (90 BASE) MCG/ACT IN AERS: 2.0000 | INHALATION_SPRAY | RESPIRATORY_TRACT | Status: DC | PRN

## 2015-02-17 NOTE — Assessment & Plan Note (Signed)
Suspect this is multifactorial with a component of deconditioning but also COPD, severity unclear. He has been using Symbicort when necessary. I would like to perform full PFT to better define his degree of obstruction. We will also start Spiriva respimat daily to see if he benefits. Albuterol when necessary. Follow-up in one month with full PFT

## 2015-02-17 NOTE — Progress Notes (Signed)
Subjective:                                  Patient ID: [name], male    DOB: 1946/05/22, 69 y.o.   MRN: [id number]  HPI 69 yo man, former smoker (80 pack years), history of hypertension, GERD, diabetes and renal insufficiency. He also carries a history of asthma diagnosed with possible asthma, given albuterol. He has had some exertional SOB for about 2 years. Has been progressive and has been worse since last 6-12 months. His wt has been stable, he has neuropathy and arthritis that have impacted his activity level. No real cough, he does hear some UA wheezing.    Review of Systems  Constitutional: Positive for appetite change. Negative for fever and unexpected weight change.  HENT: Positive for congestion and sneezing. Negative for dental problem, ear pain, nosebleeds, postnasal drip, rhinorrhea, sinus pressure, sore throat and trouble swallowing.   Eyes: Negative for redness and itching.  Respiratory: Positive for cough and shortness of breath. Negative for chest tightness and wheezing.   Cardiovascular: Negative for palpitations and leg swelling.  Gastrointestinal: Negative for nausea and vomiting.  Genitourinary: Negative for dysuria.  Musculoskeletal: Negative for joint swelling.  Skin: Negative for rash.  Neurological: Negative for headaches.  Hematological: Does not bruise/bleed easily.  Psychiatric/Behavioral: Negative for dysphoric mood. The patient is nervous/anxious.    Past Medical History  Diagnosis Date  . Hypertension   . Hyperlipidemia   . PVD (peripheral vascular disease)     right carotid artery  . Dysrhythmia     "skips beats at times"  . Asthma   . GERD (gastroesophageal reflux disease)   . Kidney stone     "passed them on my own 3 times" (11/10/2012)  . AAA (abdominal aortic aneurysm) 2008    "never repaired" (11/10/2012)  . Pneumonia 2011  . Chronic bronchitis     "~ q yr"  (11/10/2012)  . Exertional dyspnea   . Diabetes mellitus, type 2     fasting  avg 130s  . Stroke 2007    denies residual  (11/10/2012)  . Arthritis     "left ankle; back" LLE; right wrist"  (11/10/2012)  . Chronic lower back pain   . Gout     "right toe"  (11/10/2012)  . Left leg pain 12/12/2009    Qualifier: Diagnosis of  By: [name]   . Renal insufficiency 08/14/2013  . Anemia 08/14/2013  . Hereditary and idiopathic peripheral neuropathy 10/17/2014     Family History  Problem Relation Age of Onset  . Diabetes Mother   . Cancer Father     lung  . Stroke Father      History   Social History  . Marital Status: Married    Spouse Name: N/A  . Number of Children: N/A  . Years of Education: N/A   Occupational History  . unemployed    Social History Main Topics  . Smoking status: Former Smoker -- 2.00 packs/day for 40 years    Types: Cigarettes, Cigars    Quit date: 05/04/2006  . Smokeless tobacco: Never Used  . Alcohol Use: No     Comment: 11/10/2012 "quit > 20 yr ago"  . Drug Use: No  . Sexual Activity: No   Other Topics Concern  . 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 ALPRAZolam (XANAX) 0.25 MG tablet Take 1 tablet (0.25 mg total) by mouth 2 (two) times daily as needed for anxiety. 20 tablet 1  . amLODipine (NORVASC) 5 MG tablet Take 1 tablet (5 mg total) by mouth daily. 90 tablet 1  . Ascorbic Acid (VITAMIN C) 1000 MG tablet Take 1,000 mg by mouth daily.    [name] aspirin 81 MG tablet Take 81 mg by mouth daily.     [name] atorvastatin (LIPITOR) 80 MG tablet TAKE 1 TABLET BY MOUTH EVERY DAY AT 6PM 90 tablet 1  . budesonide-formoterol (SYMBICORT) 160-4.5 MCG/ACT inhaler Inhale 2 puffs into the lungs 2 (two) times daily as needed. Shortness of breath 3 Inhaler 1  . Calcium Carbonate-Vit D-Min (CALCIUM 600+D PLUS MINERALS) 600-400 MG-UNIT TABS Take 1 tablet  by mouth daily.     . carvedilol (COREG) 25 MG tablet TAKE 1/2 TABLET BY MOUTH TWICE A DAY WITH A MEAL 90 tablet 1  . cloNIDine (CATAPRES) 0.2 MG tablet Take 0.5 tablets (0.1 mg total) by mouth 2 (two) times daily. 90 tablet 1  . gabapentin (NEURONTIN) 300 MG capsule Take 2 capsules (600 mg total) by mouth 3 (three) times daily. 540 capsule 1  . glimepiride (AMARYL) 2 MG tablet TAKE 1 TABLET THREE TIMES DAILY 270 tablet 1  . glucose blood (FREESTYLE LITE) test strip Use 1 strip as instructed     . hydrochlorothiazide (MICROZIDE) 12.5 MG capsule Take 1 capsule (12.5 mg total) by mouth daily. 90 capsule 1  . Lancets (FREESTYLE) lancets Use as instructed once a day.     . losartan (COZAAR) 50 MG tablet Take 1 tablet (50 mg total) by mouth daily. 90 tablet 1  . metFORMIN (GLUCOPHAGE) 1000 MG tablet Take 1 tablet (1,000 mg total) by mouth 2 (two) times daily with a meal. 180 tablet 1  . minocycline (MINOCIN,DYNACIN) 100 MG capsule Take 100 mg by mouth 2 (two) times daily.  0  . Multiple Vitamin (MULTIVITAMIN) tablet Take 1 tablet by mouth daily.      . traMADol (ULTRAM) 50 MG tablet TAKE 1 TO 2 TABLETS BY MOUTH 3 TIMES A DAY AS NEEDED FOR PAIN 120 tablet 0  . HYDROcodone-acetaminophen (NORCO) 10-325 MG per tablet   0  . latanoprost (XALATAN) 0.005 % ophthalmic solution     . meclizine (ANTIVERT) 25 MG tablet Take 1 tablet (25 mg total) by mouth 3 (three) times daily as needed for dizziness. (Patient not taking: Reported on 02/10/2015) 30 tablet 0   No facility-administered medications prior to visit.         Objective:   Physical Exam Filed Vitals:   02/17/15 1610 02/17/15 1612  BP:  134/74  Pulse:  85  Height: 5\' 9"  (1.753 m)   Weight: 242 lb (109.77 kg)   SpO2:  95%   Gen: Pleasant, well-nourished, in no distress,  normal affect  ENT: No lesions,  mouth clear,  oropharynx clear, no postnasal drip  Neck: No JVD, no TMG, no carotid bruits  Lungs: No use of accessory muscles, clear  without rales or rhonchi  Cardiovascular: RRR, heart sounds normal, no murmur or gallops, no peripheral edema  Musculoskeletal: No deformities, no cyanosis or clubbing  Neuro: alert, non focal  Skin: Warm, no lesions or rashes      Assessment & Plan:  Dyspnea on exertion Suspect this is multifactorial with a component of deconditioning but also COPD, severity unclear. He has been using Symbicort when necessary. I would like to perform full PFT to better define his degree of obstruction. We will also start Spiriva respimat daily to see if he benefits. Albuterol when necessary. Follow-up in one month with full PFT

## 2015-02-17 NOTE — Patient Instructions (Signed)
Please stop Symbicort for now We will try starting Spiriva 2 puffs once a day. Please take this every day until our next visit Use Ventolin (albuterol) 2 puffs up to every 4 hours if needed for shortness of breath We will perform walking oximetry today We will perform full pulmonary function testing at your next office visit Please follow-up with Dr Lamonte Sakai in one month with full PFT on the same day

## 2015-02-24 ENCOUNTER — Telehealth: Payer: Self-pay | Admitting: Emergency Medicine

## 2015-02-24 NOTE — Telephone Encounter (Signed)
Pharmacy called to advise that Proventil and Ventolin are non-formulary and requested that patient get ProAir.  Ok'd patient getting ProAir.  Nothing further needed.

## 2015-03-08 ENCOUNTER — Ambulatory Visit: Payer: Medicare Other

## 2015-03-25 ENCOUNTER — Ambulatory Visit (INDEPENDENT_AMBULATORY_CARE_PROVIDER_SITE_OTHER): Payer: Medicare Other | Admitting: Family Medicine

## 2015-03-25 ENCOUNTER — Encounter: Payer: Self-pay | Admitting: Family Medicine

## 2015-03-25 VITALS — BP 110/60 | HR 64 | Temp 98.4°F | Ht 69.0 in | Wt 242.1 lb

## 2015-03-25 DIAGNOSIS — K219 Gastro-esophageal reflux disease without esophagitis: Secondary | ICD-10-CM

## 2015-03-25 DIAGNOSIS — I10 Essential (primary) hypertension: Secondary | ICD-10-CM | POA: Diagnosis not present

## 2015-03-25 DIAGNOSIS — D649 Anemia, unspecified: Secondary | ICD-10-CM | POA: Diagnosis not present

## 2015-03-25 DIAGNOSIS — E785 Hyperlipidemia, unspecified: Secondary | ICD-10-CM

## 2015-03-25 DIAGNOSIS — T7840XS Allergy, unspecified, sequela: Secondary | ICD-10-CM

## 2015-03-25 DIAGNOSIS — E1140 Type 2 diabetes mellitus with diabetic neuropathy, unspecified: Secondary | ICD-10-CM

## 2015-03-25 DIAGNOSIS — N289 Disorder of kidney and ureter, unspecified: Secondary | ICD-10-CM

## 2015-03-25 DIAGNOSIS — G609 Hereditary and idiopathic neuropathy, unspecified: Secondary | ICD-10-CM

## 2015-03-25 DIAGNOSIS — E1149 Type 2 diabetes mellitus with other diabetic neurological complication: Secondary | ICD-10-CM

## 2015-03-25 MED ORDER — CARVEDILOL 25 MG PO TABS: ORAL_TABLET | ORAL | Status: DC

## 2015-03-25 MED ORDER — AMLODIPINE BESYLATE 5 MG PO TABS: 5.0000 mg | ORAL_TABLET | Freq: Every day | ORAL | Status: DC

## 2015-03-25 MED ORDER — GABAPENTIN 300 MG PO CAPS: 600.0000 mg | ORAL_CAPSULE | Freq: Three times a day (TID) | ORAL | Status: DC

## 2015-03-25 MED ORDER — ATORVASTATIN CALCIUM 80 MG PO TABS: ORAL_TABLET | ORAL | Status: DC

## 2015-03-25 MED ORDER — CLONIDINE HCL 0.2 MG PO TABS: 0.1000 mg | ORAL_TABLET | Freq: Two times a day (BID) | ORAL | Status: DC

## 2015-03-25 MED ORDER — METFORMIN HCL 1000 MG PO TABS: 1000.0000 mg | ORAL_TABLET | Freq: Two times a day (BID) | ORAL | Status: DC

## 2015-03-25 MED ORDER — GLIMEPIRIDE 2 MG PO TABS: ORAL_TABLET | ORAL | Status: DC

## 2015-03-25 MED ORDER — HYDROCHLOROTHIAZIDE 12.5 MG PO CAPS: 12.5000 mg | ORAL_CAPSULE | Freq: Every day | ORAL | Status: DC

## 2015-03-25 MED ORDER — ALBUTEROL SULFATE HFA 108 (90 BASE) MCG/ACT IN AERS: 2.0000 | INHALATION_SPRAY | Freq: Four times a day (QID) | RESPIRATORY_TRACT | Status: DC | PRN

## 2015-03-25 NOTE — Progress Notes (Signed)
Pre visit review using our clinic review tool, if applicable. No additional management support is needed unless otherwise documented below in the visit note. 

## 2015-03-25 NOTE — Patient Instructions (Signed)

## 2015-03-28 ENCOUNTER — Ambulatory Visit (INDEPENDENT_AMBULATORY_CARE_PROVIDER_SITE_OTHER): Payer: Medicare Other | Admitting: Emergency Medicine

## 2015-03-28 ENCOUNTER — Encounter: Payer: Self-pay | Admitting: Emergency Medicine

## 2015-03-28 VITALS — BP 128/60 | HR 62 | Ht 70.0 in | Wt 247.0 lb

## 2015-03-28 DIAGNOSIS — J449 Chronic obstructive pulmonary disease, unspecified: Secondary | ICD-10-CM

## 2015-03-28 DIAGNOSIS — R0600 Dyspnea, unspecified: Secondary | ICD-10-CM

## 2015-03-28 DIAGNOSIS — R0609 Other forms of dyspnea: Secondary | ICD-10-CM

## 2015-03-28 LAB — PULMONARY FUNCTION TEST
DL/VA % pred: 83 %
DL/VA: 3.86 ml/min/mmHg/L
DLCO unc % pred: 66 %
DLCO unc: 21.63 ml/min/mmHg
FEF 25-75 Post: 0.9 L/sec
FEF 25-75 Pre: 0.68 L/sec
FEF2575-%Change-Post: 32 %
FEF2575-%Pred-Post: 34 %
FEF2575-%Pred-Pre: 26 %
FEV1-%Change-Post: 10 %
FEV1-%Pred-Post: 49 %
FEV1-%Pred-Pre: 45 %
FEV1-Post: 1.67 L
FEV1-Pre: 1.51 L
FEV1FVC-%Change-Post: 0 %
FEV1FVC-%Pred-Pre: 76 %
FEV6-%Change-Post: 10 %
FEV6-%Pred-Post: 67 %
FEV6-%Pred-Pre: 60 %
FEV6-Post: 2.87 L
FEV6-Pre: 2.6 L
FEV6FVC-%Change-Post: 0 %
FEV6FVC-%Pred-Post: 102 %
FEV6FVC-%Pred-Pre: 102 %
FVC-%Change-Post: 10 %
FVC-%Pred-Post: 64 %
FVC-%Pred-Pre: 58 %
FVC-Post: 2.93 L
FVC-Pre: 2.66 L
Post FEV1/FVC ratio: 57 %
Post FEV6/FVC ratio: 98 %
Pre FEV1/FVC ratio: 57 %
Pre FEV6/FVC Ratio: 98 %
RV % pred: 128 %
RV: 3.09 L
TLC % pred: 86 %
TLC: 6.09 L

## 2015-03-28 NOTE — Assessment & Plan Note (Addendum)
Patient has mixed disease with significant obstructive component on his pulmonary function testing from today. Based on his degree of obstruction I am surprised that he didn't benefit more from the Spiriva. He believes that his leg pain may prevent him from exerting to the point of dyspnea. At this point I would like to stop the Spiriva and use albuterol when necessary for symptoms or in preparation for exertion. We will reconsider starting a long-acting medication depending on his albuterol use and any progression of his dyspnea.   I read the PFTs and reviewed them in detail with him today. Over 75% of his 25 minute visit was spent in counseling and discussion of these results.

## 2015-03-28 NOTE — Patient Instructions (Signed)
We will not continue Spiriva at this time. You may need to restart it at some point in the future. We will discuss this further in the future.  Please fill the prescription ordered by Dr. Randel Pigg for albuterol. Use this 2 puffs up to every 4 hours if needed for shortness of breath or before exertion.  Please keep track of how frequently you use the albuterol. We will discuss this in the future to decide whether to change your medications Follow with Dr Lamonte Sakai in 6 months or sooner if you have any problems

## 2015-03-28 NOTE — Progress Notes (Signed)
Subjective:                                  Patient ID: [name], male    DOB: [birth year], 69 y.o.   MRN: [id number]  Shortness of Breath Pertinent negatives include no ear pain, fever, headaches, leg swelling, rash, rhinorrhea, sore throat, vomiting or wheezing.   69 yo man, former smoker (80 pack years), history of hypertension, GERD, diabetes and renal insufficiency. He also carries a history of asthma diagnosed with possible asthma, given albuterol. He has had some exertional SOB for about 2 years. Has been progressive and has been worse since last 6-12 months. His wt has been stable, he has neuropathy and arthritis that have impacted his activity level. No real cough, he does hear some UA wheezing.   ROB 03/28/15 -- follow-up visit for dyspnea, patient with a history of tobacco use, hypertension, diabetes, renal insufficiency. At our last visit based on suspicion that he has some degree of COPD he was started on Spiriva. He has not notice a significant change in his breathing or exertional tolerance.   He underwent PFT today that I reviewed personally and discussed with him. These show severe airflow limitation with a borderline bronchodilator response does not meet statistical significance. His lung volumes were normal suggesting pseudonormalization and a possible component of restriction as well. His DLCO was decreased and corrected into the normal range when adjusted for alveolar volume.   Review of Systems  Constitutional: Negative for fever, appetite change and unexpected weight change.  HENT: Negative for congestion, dental problem, ear pain, nosebleeds, postnasal drip, rhinorrhea, sinus pressure, sneezing, sore throat and trouble swallowing.   Eyes: Negative for redness and itching.  Respiratory: Positive for shortness of breath. Negative for cough, chest tightness and wheezing.   Cardiovascular: Negative for palpitations and leg swelling.  Gastrointestinal: Negative for  nausea and vomiting.  Genitourinary: Negative for dysuria.  Musculoskeletal: Positive for gait problem. Negative for joint swelling.  Skin: Negative for rash.  Neurological: Negative for headaches.  Hematological: Does not bruise/bleed easily.  Psychiatric/Behavioral: Negative for dysphoric mood. The patient is nervous/anxious.         Objective:   Physical Exam Filed Vitals:   03/28/15 1343  BP: 128/60  Pulse: 62  Height: 5\' 10"  (1.778 m)  Weight: 247 lb (112.038 kg)  SpO2: 90%   Gen: Pleasant, well-nourished, in no distress,  normal affect  ENT: No lesions,  mouth clear,  oropharynx clear, no postnasal drip  Neck: No JVD, no TMG, no carotid bruits  Lungs: No use of accessory muscles, clear without rales or rhonchi  Cardiovascular: RRR, heart sounds normal, no murmur or gallops, no peripheral edema  Musculoskeletal: No deformities, no cyanosis or clubbing  Neuro: alert, non focal  Skin: Warm, no lesions or rashes      Assessment & Plan:  COPD (chronic obstructive pulmonary disease) Patient has mixed disease with significant obstructive component on his pulmonary function testing from today. Based on his degree of obstruction I am surprised that he didn't benefit more from the Spiriva. He believes that his leg pain may prevent him from exerting to the point of dyspnea. At this point I would like to stop the Spiriva and use albuterol when necessary for symptoms or in preparation for exertion. We will reconsider starting a long-acting medication depending on his albuterol use and any progression of his dyspnea.  I read the PFTs and reviewed them in detail with him today. Over 75% of his 25 minute visit was spent in counseling and discussion of these results.

## 2015-03-28 NOTE — Progress Notes (Signed)
PFT done today. 

## 2015-04-03 ENCOUNTER — Encounter: Payer: Self-pay | Admitting: Family Medicine

## 2015-04-03 DIAGNOSIS — T7840XA Allergy, unspecified, initial encounter: Secondary | ICD-10-CM | POA: Insufficient documentation
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
## 2015-04-03 NOTE — Assessment & Plan Note (Signed)
hgba1c acceptable, minimize simple carbs. Increase exercise as tolerated. Continue current meds 

## 2015-04-03 NOTE — Assessment & Plan Note (Signed)
Well controlled, no changes to meds. Encouraged heart healthy diet such as the DASH diet and exercise as tolerated.  °

## 2015-04-03 NOTE — Assessment & Plan Note (Signed)
Clear rhinorrhea, encouraged Zyrtec daily prn

## 2015-04-03 NOTE — Assessment & Plan Note (Signed)
Avoid offending foods, take probiotics. Do not eat large meals in late evening and consider raising head of bed. Continue current meds 

## 2015-04-03 NOTE — Assessment & Plan Note (Signed)
Encouraged heart healthy diet, increase exercise, avoid trans fats, consider a krill oil cap daily 

## 2015-04-03 NOTE — Assessment & Plan Note (Addendum)
Tolerable with gabapentin, is given refill

## 2015-05-02 ENCOUNTER — Other Ambulatory Visit: Payer: Self-pay

## 2015-05-12 ENCOUNTER — Encounter: Payer: Self-pay | Admitting: Family Medicine

## 2015-05-12 ENCOUNTER — Telehealth: Payer: Self-pay | Admitting: Family Medicine

## 2015-05-12 ENCOUNTER — Ambulatory Visit (INDEPENDENT_AMBULATORY_CARE_PROVIDER_SITE_OTHER): Payer: Medicare Other | Admitting: Family Medicine

## 2015-05-12 VITALS — BP 122/80 | HR 67 | Temp 97.9°F | Ht 69.0 in | Wt 238.1 lb

## 2015-05-12 DIAGNOSIS — K219 Gastro-esophageal reflux disease without esophagitis: Secondary | ICD-10-CM | POA: Diagnosis not present

## 2015-05-12 DIAGNOSIS — E1140 Type 2 diabetes mellitus with diabetic neuropathy, unspecified: Secondary | ICD-10-CM

## 2015-05-12 DIAGNOSIS — I10 Essential (primary) hypertension: Secondary | ICD-10-CM | POA: Diagnosis not present

## 2015-05-12 DIAGNOSIS — E785 Hyperlipidemia, unspecified: Secondary | ICD-10-CM | POA: Diagnosis not present

## 2015-05-12 DIAGNOSIS — E1149 Type 2 diabetes mellitus with other diabetic neurological complication: Secondary | ICD-10-CM

## 2015-05-12 MED ORDER — TRAMADOL HCL 50 MG PO TABS: ORAL_TABLET | ORAL | Status: DC

## 2015-05-12 NOTE — Telephone Encounter (Signed)
i am not taking any new primary care patients at all indefinitely. I am sure one of my partners would accomodate.

## 2015-05-12 NOTE — Telephone Encounter (Signed)
°  Patient would like to transfer to a physician over at [facility], preferably Dr. Lorelei Pont. Is this ok. [name] has already given the ok.

## 2015-05-12 NOTE — Telephone Encounter (Signed)
Left detailed message informing patient of this and to call the [facility] office

## 2015-05-12 NOTE — Patient Instructions (Signed)

## 2015-05-12 NOTE — Progress Notes (Signed)
Pre visit review using our clinic review tool, if applicable. No additional management support is needed unless otherwise documented below in the visit note. 
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
## 2015-05-13 LAB — COMPREHENSIVE METABOLIC PANEL
ALT: 19 U/L (ref 0–53)
AST: 21 U/L (ref 0–37)
Albumin: 4.1 g/dL (ref 3.5–5.2)
Alkaline Phosphatase: 66 U/L (ref 39–117)
BUN: 24 mg/dL — ABNORMAL HIGH (ref 6–23)
CO2: 30 mEq/L (ref 19–32)
Calcium: 9.6 mg/dL (ref 8.4–10.5)
Chloride: 102 mEq/L (ref 96–112)
Creatinine, Ser: 1.1 mg/dL (ref 0.40–1.50)
GFR: 70.81 mL/min (ref >60.00)
Glucose, Bld: 104 mg/dL — ABNORMAL HIGH (ref 70–99)
Potassium: 4.2 mEq/L (ref 3.5–5.1)
Sodium: 141 mEq/L (ref 135–145)
Total Bilirubin: 0.5 mg/dL (ref 0.2–1.2)
Total Protein: 7 g/dL (ref 6.0–8.3)

## 2015-05-13 LAB — LIPID PANEL
Cholesterol: 116 mg/dL (ref 0–200)
HDL: 27.1 mg/dL — ABNORMAL LOW (ref >39.00)
LDL Cholesterol: 50 mg/dL (ref 0–99)
NonHDL: 88.9
Total CHOL/HDL Ratio: 4
Triglycerides: 197 mg/dL — ABNORMAL HIGH (ref 0.0–149.0)
VLDL: 39.4 mg/dL (ref 0.0–40.0)

## 2015-05-13 LAB — HEMOGLOBIN A1C: Hgb A1c MFr Bld: 7 % — ABNORMAL HIGH (ref 4.6–6.5)

## 2015-05-13 LAB — CBC
HCT: 40.7 % (ref 39.0–52.0)
Hemoglobin: 13.5 g/dL (ref 13.0–17.0)
MCHC: 33.2 g/dL (ref 30.0–36.0)
MCV: 95.8 fl (ref 78.0–100.0)
Platelets: 231 10*3/uL (ref 150.0–400.0)
RBC: 4.25 Mil/uL (ref 4.22–5.81)
RDW: 14 % (ref 11.5–15.5)
WBC: 8.9 10*3/uL (ref 4.0–10.5)

## 2015-05-13 LAB — TSH: TSH: 3.37 u[IU]/mL (ref 0.35–4.50)

## 2015-05-21 ENCOUNTER — Other Ambulatory Visit: Payer: Self-pay | Admitting: Family Medicine

## 2015-05-22 NOTE — Assessment & Plan Note (Signed)
Tolerating statin, encouraged heart healthy diet, avoid trans fats, minimize simple carbs and saturated fats. Increase exercise as tolerated 

## 2015-05-22 NOTE — Assessment & Plan Note (Signed)
hgba1c acceptable, minimize simple carbs. Increase exercise as tolerated. Continue current meds 

## 2015-05-22 NOTE — Assessment & Plan Note (Signed)
Well controlled, no changes to meds. Encouraged heart healthy diet such as the DASH diet and exercise as tolerated.  °

## 2015-05-22 NOTE — Assessment & Plan Note (Signed)
Avoid offending foods, take probiotics. Do not eat large meals in late evening and consider raising head of bed.  

## 2015-05-23 ENCOUNTER — Ambulatory Visit: Payer: Medicare Other | Admitting: Podiatry

## 2015-05-25 ENCOUNTER — Ambulatory Visit (INDEPENDENT_AMBULATORY_CARE_PROVIDER_SITE_OTHER): Payer: Medicare Other | Admitting: Podiatry

## 2015-05-25 ENCOUNTER — Encounter: Payer: Self-pay | Admitting: Podiatry

## 2015-05-25 VITALS — BP 138/69 | HR 86 | Resp 12

## 2015-05-25 DIAGNOSIS — B351 Tinea unguium: Secondary | ICD-10-CM | POA: Diagnosis not present

## 2015-05-25 DIAGNOSIS — L84 Corns and callosities: Secondary | ICD-10-CM

## 2015-05-25 DIAGNOSIS — E1140 Type 2 diabetes mellitus with diabetic neuropathy, unspecified: Secondary | ICD-10-CM

## 2015-05-25 DIAGNOSIS — E1149 Type 2 diabetes mellitus with other diabetic neurological complication: Secondary | ICD-10-CM

## 2015-05-25 NOTE — Patient Instructions (Signed)
Apply Vaseline to the callused on the left great toe and and scabs on the toes and a topical left foot until the scab falls off and you see no sign of bleeding callus in the left big toe Diabetes and Foot Care Diabetes may cause you to have problems because of poor blood supply (circulation) to your feet and legs. This may cause the skin on your feet to become thinner, break easier, and heal more slowly. Your skin may become dry, and the skin may peel and crack. You may also have nerve damage in your legs and feet causing decreased feeling in them. You may not notice minor injuries to your feet that could lead to infections or more serious problems. Taking care of your feet is one of the most important things you can do for yourself.  HOME CARE INSTRUCTIONS  Wear shoes at all times, even in the house. Do not go barefoot. Bare feet are easily injured.  Check your feet daily for blisters, cuts, and redness. If you cannot see the bottom of your feet, use a mirror or ask someone for help.  Wash your feet with warm water (do not use hot water) and mild soap. Then pat your feet and the areas between your toes until they are completely dry. Do not soak your feet as this can dry your skin.  Apply a moisturizing lotion or petroleum jelly (that does not contain alcohol and is unscented) to the skin on your feet and to dry, brittle toenails. Do not apply lotion between your toes.  Trim your toenails straight across. Do not dig under them or around the cuticle. File the edges of your nails with an emery board or nail file.  Do not cut corns or calluses or try to remove them with medicine.  Wear clean socks or stockings every day. Make sure they are not too tight. Do not wear knee-high stockings since they may decrease blood flow to your legs.  Wear shoes that fit properly and have enough cushioning. To break in new shoes, wear them for just a few hours a day. This prevents you from injuring your feet.  Always look in your shoes before you put them on to be sure there are no objects inside.  Do not cross your legs. This may decrease the blood flow to your feet.  If you find a minor scrape, cut, or break in the skin on your feet, keep it and the skin around it clean and dry. These areas may be cleansed with mild soap and water. Do not cleanse the area with peroxide, alcohol, or iodine.  When you remove an adhesive bandage, be sure not to damage the skin around it.  If you have a wound, look at it several times a day to make sure it is healing.  Do not use heating pads or hot water bottles. They may burn your skin. If you have lost feeling in your feet or legs, you may not know it is happening until it is too late.  Make sure your health care provider performs a complete foot exam at least annually or more often if you have foot problems. Report any cuts, sores, or bruises to your health care provider immediately. SEEK MEDICAL CARE IF:   You have an injury that is not healing.  You have cuts or breaks in the skin.  You have an ingrown nail.  You notice redness on your legs or feet.  You feel burning or tingling in  your legs or feet.  You have pain or cramps in your legs and feet.  Your legs or feet are numb.  Your feet always feel cold. SEEK IMMEDIATE MEDICAL CARE IF:   There is increasing redness, swelling, or pain in or around a wound.  There is a red line that goes up your leg.  Pus is coming from a wound.  You develop a fever or as directed by your health care provider.  You notice a bad smell coming from an ulcer or wound. Document Released: 10/19/2000 Document Revised: 06/24/2013 Document Reviewed: 03/31/2013 [facility] Patient Information 2015 [place], [place]. This information is not intended to replace advice given to you by your health care provider. Make sure you discuss any questions you have with your health care provider. Diabetes and Foot Care Diabetes may cause  you to have problems because of poor blood supply (circulation) to your feet and legs. This may cause the skin on your feet to become thinner, break easier, and heal more slowly. Your skin may become dry, and the skin may peel and crack. You may also have nerve damage in your legs and feet causing decreased feeling in them. You may not notice minor injuries to your feet that could lead to infections or more serious problems. Taking care of your feet is one of the most important things you can do for yourself.  HOME CARE INSTRUCTIONS  Wear shoes at all times, even in the house. Do not go barefoot. Bare feet are easily injured.  Check your feet daily for blisters, cuts, and redness. If you cannot see the bottom of your feet, use a mirror or ask someone for help.  Wash your feet with warm water (do not use hot water) and mild soap. Then pat your feet and the areas between your toes until they are completely dry. Do not soak your feet as this can dry your skin.  Apply a moisturizing lotion or petroleum jelly (that does not contain alcohol and is unscented) to the skin on your feet and to dry, brittle toenails. Do not apply lotion between your toes.  Trim your toenails straight across. Do not dig under them or around the cuticle. File the edges of your nails with an emery board or nail file.  Do not cut corns or calluses or try to remove them with medicine.  Wear clean socks or stockings every day. Make sure they are not too tight. Do not wear knee-high stockings since they may decrease blood flow to your legs.  Wear shoes that fit properly and have enough cushioning. To break in new shoes, wear them for just a few hours a day. This prevents you from injuring your feet. Always look in your shoes before you put them on to be sure there are no objects inside.  Do not cross your legs. This may decrease the blood flow to your feet.  If you find a minor scrape, cut, or break in the skin on your feet, keep  it and the skin around it clean and dry. These areas may be cleansed with mild soap and water. Do not cleanse the area with peroxide, alcohol, or iodine.  When you remove an adhesive bandage, be sure not to damage the skin around it.  If you have a wound, look at it several times a day to make sure it is healing.  Do not use heating pads or hot water bottles. They may burn your skin. If you have lost feeling in  your feet or legs, you may not know it is happening until it is too late.  Make sure your health care provider performs a complete foot exam at least annually or more often if you have foot problems. Report any cuts, sores, or bruises to your health care provider immediately. SEEK MEDICAL CARE IF:   You have an injury that is not healing.  You have cuts or breaks in the skin.  You have an ingrown nail.  You notice redness on your legs or feet.  You feel burning or tingling in your legs or feet.  You have pain or cramps in your legs and feet.  Your legs or feet are numb.  Your feet always feel cold. SEEK IMMEDIATE MEDICAL CARE IF:   There is increasing redness, swelling, or pain in or around a wound.  There is a red line that goes up your leg.  Pus is coming from a wound.  You develop a fever or as directed by your health care provider.  You notice a bad smell coming from an ulcer or wound. Document Released: 10/19/2000 Document Revised: 06/24/2013 Document Reviewed: 03/31/2013 [facility] Patient Information 2015 [place], [place]. This information is not intended to replace advice given to you by your health care provider. Make sure you discuss any questions you have with your health care provider.

## 2015-05-25 NOTE — Progress Notes (Signed)
   Subjective:    Patient ID: [name], male    DOB: August 26, 1946, 69 y.o.   MRN: [id number]  HPI   Patient presents today requesting debridement of thick toenails and keratoses he has a history of neuropathy and deformity and has been seen in the past by Dr. Blenda Mounts with the last visit on 01/19/2015. He also is currently wearing diabetic shoes with custom molded insoles dispensed on the visit of 12/21/2014  Review of Systems  Cardiovascular: Positive for leg swelling.  Skin: Positive for color change.  All other systems reviewed and are negative.  Denies history of claudication amputation    Objective:   Physical Exam  Orientated 3 Vascular: No peripheral edema noted bilaterally DP pulses 2/4 bilaterally PT pulses 2/4 bilaterally Capillary reflex immediate bilaterally  Neurological: Sensation to 10 g monofilament wire intact 2/5 bilaterally Vibratory sensation reactive bilaterally Ankle reflex reactive bilaterally  Dermatological: The toenails are brittle, elongated, discolored, 6-10 bleeding callus left hallux  Musculoskeletal: HAV right Hallux limitus left      Assessment & Plan:   Assessment: Diabetic peripheral neuropathy Adequate vascular status Mycotic toenails 6-10 Pre-ulcerative bleeding callus left hallux  Plan: Debrided toenails 10 and mechanically and left without any bleeding Debridement of the callus left hallux about a bleeding  Reappoint 6 months or sooner if patient has a concern

## 2015-07-15 ENCOUNTER — Ambulatory Visit (INDEPENDENT_AMBULATORY_CARE_PROVIDER_SITE_OTHER): Payer: Medicare Other | Admitting: Physician Assistant

## 2015-07-15 VITALS — BP 110/74 | HR 62 | Temp 98.3°F | Resp 18 | Ht 70.0 in | Wt 218.6 lb

## 2015-07-15 DIAGNOSIS — E1149 Type 2 diabetes mellitus with other diabetic neurological complication: Secondary | ICD-10-CM

## 2015-07-15 DIAGNOSIS — E1140 Type 2 diabetes mellitus with diabetic neuropathy, unspecified: Secondary | ICD-10-CM | POA: Diagnosis not present

## 2015-07-15 DIAGNOSIS — R634 Abnormal weight loss: Secondary | ICD-10-CM | POA: Diagnosis not present

## 2015-07-15 DIAGNOSIS — K529 Noninfective gastroenteritis and colitis, unspecified: Secondary | ICD-10-CM | POA: Diagnosis not present

## 2015-07-15 DIAGNOSIS — R197 Diarrhea, unspecified: Secondary | ICD-10-CM | POA: Diagnosis not present

## 2015-07-15 LAB — POCT URINALYSIS DIPSTICK
Blood, UA: NEGATIVE
Glucose, UA: NEGATIVE
Ketones, UA: 15
Leukocytes, UA: NEGATIVE
Nitrite, UA: NEGATIVE
Protein, UA: 100
Spec Grav, UA: >=1.03
Urobilinogen, UA: 0.2
pH, UA: 5.5

## 2015-07-15 LAB — TSH: TSH: 1.015 u[IU]/mL (ref 0.350–4.500)

## 2015-07-15 LAB — POCT CBC
Granulocyte percent: 66.6 %G (ref 37–80)
HCT, POC: 50.1 % (ref 43.5–53.7)
Hemoglobin: 16.4 g/dL (ref 14.1–18.1)
Lymph, poc: 2.5 (ref 0.6–3.4)
MCH, POC: 30 pg (ref 27–31.2)
MCHC: 32.6 g/dL (ref 31.8–35.4)
MCV: 92 fL (ref 80–97)
MID (cbc): 0.3 (ref 0–0.9)
MPV: 7.9 fL (ref 0–99.8)
POC Granulocyte: 5.5 (ref 2–6.9)
POC LYMPH PERCENT: 30.3 %L (ref 10–50)
POC MID %: 3.1 %M (ref 0–12)
Platelet Count, POC: 222 10*3/uL (ref 142–424)
RBC: 5.45 M/uL (ref 4.69–6.13)
RDW, POC: 14.2 %
WBC: 8.2 10*3/uL (ref 4.6–10.2)

## 2015-07-15 LAB — COMPREHENSIVE METABOLIC PANEL
ALT: 50 U/L — ABNORMAL HIGH (ref 9–46)
AST: 66 U/L — ABNORMAL HIGH (ref 10–35)
Albumin: 4.8 g/dL (ref 3.6–5.1)
Alkaline Phosphatase: 72 U/L (ref 40–115)
BUN: 19 mg/dL (ref 7–25)
CO2: 24 mmol/L (ref 20–31)
Calcium: 10 mg/dL (ref 8.6–10.3)
Chloride: 101 mmol/L (ref 98–110)
Creat: 1.05 mg/dL (ref 0.70–1.25)
Glucose, Bld: 146 mg/dL — ABNORMAL HIGH (ref 65–99)
Potassium: 4.5 mmol/L (ref 3.5–5.3)
Sodium: 138 mmol/L (ref 135–146)
Total Bilirubin: 0.7 mg/dL (ref 0.2–1.2)
Total Protein: 7.8 g/dL (ref 6.1–8.1)

## 2015-07-15 LAB — POCT GLYCOSYLATED HEMOGLOBIN (HGB A1C): Hemoglobin A1C: 6.3

## 2015-07-15 LAB — POCT SEDIMENTATION RATE: POCT SED RATE: 17 mm/hr (ref 0–22)

## 2015-07-15 LAB — HIV ANTIBODY (ROUTINE TESTING W REFLEX): HIV 1&2 Ab, 4th Generation: NONREACTIVE

## 2015-07-15 NOTE — Patient Instructions (Addendum)
Drink plenty of fluids, at least 64 oz of water a day. You may suck on ice if you are not thirsty. Bland diet, advance as able.  Stay away from vegetables and greasy food for the next few days. If you are still having diarrhea in 1 week or if you develop bloody diarrhea or fever, return to clinic. Return with your stool samples. I will call you with the results of your tests. Keep up the good work with drinking water and better diet!!   Food Choices to Help Relieve Diarrhea When you have diarrhea, the foods you eat and your eating habits are very important. Choosing the right foods and drinks can help relieve diarrhea. Also, because diarrhea can last up to 7 days, you need to replace lost fluids and electrolytes (such as sodium, potassium, and chloride) in order to help prevent dehydration.  WHAT GENERAL GUIDELINES DO I NEED TO FOLLOW?  Slowly drink 1 cup (8 oz) of fluid for each episode of diarrhea. If you are getting enough fluid, your urine will be clear or pale yellow.  Eat starchy foods. Some good choices include white rice, white toast, pasta, low-fiber cereal, baked potatoes (without the skin), saltine crackers, and bagels.  Avoid large servings of any cooked vegetables.  Limit fruit to two servings per day. A serving is  cup or 1 small piece.  Choose foods with less than 2 g of fiber per serving.  Limit fats to less than 8 tsp (38 g) per day.  Avoid fried foods.  Eat foods that have probiotics in them. Probiotics can be found in certain dairy products.  Avoid foods and beverages that may increase the speed at which food moves through the stomach and intestines (gastrointestinal tract). Things to avoid include:  High-fiber foods, such as dried fruit, raw fruits and vegetables, nuts, seeds, and whole grain foods.  Spicy foods and high-fat foods.  Foods and beverages sweetened with high-fructose corn syrup, honey, or sugar alcohols such as xylitol, sorbitol, and  mannitol. WHAT FOODS ARE RECOMMENDED? Grains White rice. White, [place], or pita breads (fresh or toasted), including plain rolls, buns, or bagels. White pasta. Saltine, soda, or graham crackers. Pretzels. Low-fiber cereal. Cooked cereals made with water (such as cornmeal, farina, or cream cereals). Plain muffins. Matzo. Melba toast. Zwieback.  Vegetables Potatoes (without the skin). Strained tomato and vegetable juices. Most well-cooked and canned vegetables without seeds. Tender lettuce. Fruits Cooked or canned applesauce, apricots, cherries, fruit cocktail, grapefruit, peaches, pears, or plums. Fresh bananas, apples without skin, cherries, grapes, cantaloupe, grapefruit, peaches, oranges, or plums.  Meat and Other Protein Products Baked or boiled chicken. Eggs. Tofu. Fish. Seafood. Smooth peanut butter. Ground or well-cooked tender beef, ham, veal, lamb, pork, or poultry.  Dairy Plain yogurt, kefir, and unsweetened liquid yogurt. Lactose-free milk, buttermilk, or soy milk. Plain hard cheese. Beverages Sport drinks. Clear broths. Diluted fruit juices (except prune). Regular, caffeine-free sodas such as ginger ale. Water. Decaffeinated teas. Oral rehydration solutions. Sugar-free beverages not sweetened with sugar alcohols. Other Bouillon, broth, or soups made from recommended foods.  The items listed above may not be a complete list of recommended foods or beverages. Contact your dietitian for more options. WHAT FOODS ARE NOT RECOMMENDED? Grains Whole grain, whole wheat, bran, or rye breads, rolls, pastas, crackers, and cereals. Wild or brown rice. Cereals that contain more than 2 g of fiber per serving. Corn tortillas or taco shells. Cooked or dry oatmeal. Granola. Popcorn. Vegetables Raw vegetables. Cabbage, broccoli, Brussels  sprouts, artichokes, baked beans, beet greens, corn, kale, legumes, peas, sweet potatoes, and yams. Potato skins. Cooked spinach and cabbage. Fruits Dried fruit,  including raisins and dates. Raw fruits. Stewed or dried prunes. Fresh apples with skin, apricots, mangoes, pears, raspberries, and strawberries.  Meat and Other Protein Products Chunky peanut butter. Nuts and seeds. Beans and lentils. Berniece Salines.  Dairy High-fat cheeses. Milk, chocolate milk, and beverages made with milk, such as milk shakes. Cream. Ice cream. Sweets and Desserts Sweet rolls, doughnuts, and sweet breads. Pancakes and waffles. Fats and Oils Butter. Cream sauces. Margarine. Salad oils. Plain salad dressings. Olives. Avocados.  Beverages Caffeinated beverages (such as coffee, tea, soda, or energy drinks). Alcoholic beverages. Fruit juices with pulp. Prune juice. Soft drinks sweetened with high-fructose corn syrup or sugar alcohols. Other Coconut. Hot sauce. Chili powder. Mayonnaise. Gravy. Cream-based or milk-based soups.  The items listed above may not be a complete list of foods and beverages to avoid. Contact your dietitian for more information. WHAT SHOULD I DO IF I BECOME DEHYDRATED? Diarrhea can sometimes lead to dehydration. Signs of dehydration include dark urine and dry mouth and skin. If you think you are dehydrated, you should rehydrate with an oral rehydration solution. These solutions can be purchased at pharmacies, retail stores, or online.  Drink -1 cup (120-240 mL) of oral rehydration solution each time you have an episode of diarrhea. If drinking this amount makes your diarrhea worse, try drinking smaller amounts more often. For example, drink 1-3 tsp (5-15 mL) every 5-10 minutes.  A general rule for staying hydrated is to drink 1-2 L of fluid per day. Talk to your health care provider about the specific amount you should be drinking each day. Drink enough fluids to keep your urine clear or pale yellow. Document Released: 01/12/2004 Document Revised: 10/27/2013 Document Reviewed: 09/14/2013 [facility] Patient Information 2015 [place], [place]. This information is not  intended to replace advice given to you by your health care provider. Make sure you discuss any questions you have with your health care provider.
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
## 2015-07-16 LAB — PSA: PSA: 3.94 ng/mL (ref <=4.00)

## 2015-07-20 ENCOUNTER — Other Ambulatory Visit: Payer: Self-pay | Admitting: Family Medicine

## 2015-07-20 NOTE — Progress Notes (Signed)
  Medical screening examination/treatment/procedure(s) were performed by non-physician practitioner and as supervising physician I was immediately available for consultation/collaboration.     

## 2015-07-20 NOTE — Telephone Encounter (Signed)
LOV and Labs- 05/12/15 Appointment- 09/06/15 with Dr. Danise Mina Last ordered 03/25/15 Atorvastatin 80mg  Take 1 tab by mouth

## 2015-07-26 ENCOUNTER — Other Ambulatory Visit: Payer: Self-pay | Admitting: Family Medicine

## 2015-07-26 NOTE — Telephone Encounter (Signed)
Refill Tramadol same strength, same sig, same number

## 2015-07-27 NOTE — Telephone Encounter (Signed)
Rx filled awaiting signature.

## 2015-07-28 NOTE — Telephone Encounter (Signed)
Faxed hardcopy for tramadol to [facility]

## 2015-08-29 LAB — HM DIABETES EYE EXAM

## 2015-09-06 ENCOUNTER — Encounter: Payer: Self-pay | Admitting: Family Medicine

## 2015-09-06 ENCOUNTER — Ambulatory Visit (INDEPENDENT_AMBULATORY_CARE_PROVIDER_SITE_OTHER): Payer: Medicare Other | Admitting: Family Medicine

## 2015-09-06 VITALS — BP 138/74 | HR 70 | Temp 98.0°F | Wt 232.5 lb

## 2015-09-06 DIAGNOSIS — I714 Abdominal aortic aneurysm, without rupture, unspecified: Secondary | ICD-10-CM

## 2015-09-06 DIAGNOSIS — Z23 Encounter for immunization: Secondary | ICD-10-CM | POA: Diagnosis not present

## 2015-09-06 DIAGNOSIS — E1149 Type 2 diabetes mellitus with other diabetic neurological complication: Secondary | ICD-10-CM

## 2015-09-06 DIAGNOSIS — G609 Hereditary and idiopathic neuropathy, unspecified: Secondary | ICD-10-CM

## 2015-09-06 DIAGNOSIS — E785 Hyperlipidemia, unspecified: Secondary | ICD-10-CM

## 2015-09-06 DIAGNOSIS — I2510 Atherosclerotic heart disease of native coronary artery without angina pectoris: Secondary | ICD-10-CM

## 2015-09-06 DIAGNOSIS — E1151 Type 2 diabetes mellitus with diabetic peripheral angiopathy without gangrene: Secondary | ICD-10-CM

## 2015-09-06 DIAGNOSIS — I10 Essential (primary) hypertension: Secondary | ICD-10-CM

## 2015-09-06 DIAGNOSIS — Z8673 Personal history of transient ischemic attack (TIA), and cerebral infarction without residual deficits: Secondary | ICD-10-CM

## 2015-09-06 DIAGNOSIS — M5126 Other intervertebral disc displacement, lumbar region: Secondary | ICD-10-CM

## 2015-09-06 NOTE — Assessment & Plan Note (Signed)
Continue tight glycemic control. Continue aspirin and statin. Known carotid stenosis s/p bilat CEA, AAA.

## 2015-09-06 NOTE — Assessment & Plan Note (Addendum)
Overdue for rpt ultrasound - ordered today.

## 2015-09-06 NOTE — Assessment & Plan Note (Signed)
Continue gabapentin. ?diabetic neuropathy

## 2015-09-06 NOTE — Assessment & Plan Note (Signed)
Continue aspirin. Denies residual deficits from prior CVA

## 2015-09-06 NOTE — Assessment & Plan Note (Signed)
Chronic, stable. Continue current regimen. 

## 2015-09-06 NOTE — Assessment & Plan Note (Signed)
Stable on PRN tramadol.

## 2015-09-06 NOTE — Assessment & Plan Note (Signed)
Asxs. Continue aspirin, statin.  

## 2015-09-06 NOTE — Progress Notes (Signed)
Pre visit review using our clinic review tool, if applicable. No additional management support is needed unless otherwise documented below in the visit note. 

## 2015-09-06 NOTE — Progress Notes (Signed)
BP 138/74 mmHg  Pulse 70  Temp(Src) 98 F (36.7 C)  Wt 232 lb 8 oz (105.461 kg)   CC: transfer of care  Subjective:    Patient ID: [name], male    DOB: [birth year], 69 y.o.   MRN: [id number]  HPI: [name] is a 69 y.o. male presenting on 09/06/2015 for Establish Care   Transfer of acre from Dr Randel Pigg. Presents with wife. Works part time at [employer]. Last month had viral gastroenteritis seen at [facility] - now resolved.   DM - regularly does check sugars (a few times a week) 130s. Losing weight, trying. Compliant with antihyperglycemic regimen which includes: metformin 1000mg  BID with 500mg  at lunch, amaryl 2mg  TID.  Denies low sugars but has had low sugar symptoms (dizziness). Some foot paresthesias - on gabapentin and tramadol. Last diabetic eye exam 08/2015.  Pneumovax: 2011.  Prevnar: 2015. Lab Results  Component Value Date   HGBA1C 6.3 07/15/2015   Diabetic Foot Exam - Simple   Simple Foot Form  Diabetic Foot exam was performed with the following findings:  Yes 09/06/2015  3:00 PM  Visual Inspection  See comments:  Yes  Sensation Testing  Intact to touch and monofilament testing bilaterally:  Yes  Pulse Check  Posterior Tibialis and Dorsalis pulse intact bilaterally:  Yes  Comments  2+ DP R>L Callus formation bilateral medial great toes      HLD - chronic, stable on lipitor 80mg  without myalgias.  GERD - controlled with dietary changes  HTN - Compliant with current antihypertensive regimen of amlodipine 5mg , clonidine 0.1mg  bid, carvedilol 12.5mg  bid, hctz 12.5mg  daily and losartan 50mg  daily. Does not check blood pressures at home. No low blood pressure symptoms of syncope. Rare dizziness. Denies HA, vision changes, CP/tightness, SOB, leg swelling.    DDD lumbar spine - on tramadol prn.   PVD - carotid stenosis s/p CEA bilaterally.  Known AAA overdue for f/u  H/o CVA 2007 - no residual impairment  Lives with wife, no pets Grown children. 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 Interim medical history since our last visit reviewed. Allergies and medications reviewed and updated. Current Outpatient Prescriptions on File Prior to Visit  Medication Sig  . amLODipine (NORVASC) 5 MG tablet Take 1 tablet (5 mg total) by mouth daily.  . Ascorbic Acid (VITAMIN C) 1000 MG tablet Take 1,000 mg by mouth daily.  [name] aspirin 81 MG tablet Take 81 mg by mouth daily.   [name] atorvastatin (LIPITOR) 80 MG tablet TAKE 1 TABLET BY MOUTH  EVERY DAY AT 6PM  . carvedilol (COREG) 25 MG tablet TAKE 1/2 TABLET BY MOUTH TWICE A DAY WITH A MEAL  . cloNIDine (CATAPRES) 0.2 MG tablet Take 0.5 tablets (0.1 mg total) by mouth 2 (two) times daily.  [name] gabapentin (NEURONTIN) 300 MG capsule Take 2 capsules (600 mg total) by mouth 3 (three) times daily.  [name] glimepiride (AMARYL) 2 MG tablet TAKE 1 TABLET THREE TIMES DAILY  . glucose blood (FREESTYLE LITE) test strip Use 1 strip as instructed   . hydrochlorothiazide (MICROZIDE) 12.5 MG capsule Take 1 capsule (12.5 mg total) by mouth daily.  . Lancets (FREESTYLE) lancets Use as instructed once a day.   . losartan (COZAAR) 50 MG tablet Take 1 tablet by mouth  daily  . metFORMIN (GLUCOPHAGE) 1000 MG tablet  Take 1 tablet (1,000 mg total) by mouth 2 (two) times daily with a meal. (Patient taking differently: Take 1,000 mg by mouth as directed. 1 tab qAM, 1/2 tab at lunch, and one tab qPM.)  . Multiple Vitamin (MULTIVITAMIN) tablet Take 1 tablet by mouth daily.    . traMADol (ULTRAM) 50 MG tablet TAKE 1 TO 2 TABLETS BY MOUTH 3 TIMES A DAY AS NEEDED FOR PAIN   No current facility-administered medications on file prior to visit.    Review of Systems Per HPI unless specifically indicated in ROS section     Objective:    BP 138/74 mmHg  Pulse 70   Temp(Src) 98 F (36.7 C)  Wt 232 lb 8 oz (105.461 kg)  Wt Readings from Last 3 Encounters:  09/06/15 232 lb 8 oz (105.461 kg)  07/15/15 218 lb 9.6 oz (99.156 kg)  05/12/15 238 lb 2 oz (108.013 kg)   Body mass index is 33.36 kg/(m^2).  Physical Exam  Constitutional: He appears well-developed and well-nourished. No distress.  HENT:  Head: Normocephalic and atraumatic.  Right Ear: External ear normal.  Left Ear: External ear normal.  Nose: Nose normal.  Mouth/Throat: Oropharynx is clear and moist. No oropharyngeal exudate.  Eyes: Conjunctivae and EOM are normal. Pupils are equal, round, and reactive to light. No scleral icterus.  Neck: Normal range of motion. Neck supple.  Cardiovascular: Normal rate, regular rhythm, normal heart sounds and intact distal pulses.   No murmur heard. Pulmonary/Chest: Effort normal and breath sounds normal. No respiratory distress. He has no wheezes. He has no rales.  Musculoskeletal: He exhibits no edema.  See HPI for foot exam if done  Lymphadenopathy:    He has no cervical adenopathy.  Skin: Skin is warm and dry. No rash noted.  Psychiatric: He has a normal mood and affect.  Nursing note and vitals reviewed.  Results for orders placed or performed in visit on 09/06/15  HM DIABETES EYE EXAM  Result Value Ref Range   HM Diabetic Eye Exam No Retinopathy No Retinopathy      Assessment & Plan:   Problem List Items Addressed This Visit    Lumbar herniated disc    Stable on PRN tramadol.      Hyperlipidemia    Chronic, stable. Continue current regimen.      History of CVA (cerebrovascular accident) without residual deficits    Continue aspirin. Denies residual deficits from prior CVA      Hereditary and idiopathic peripheral neuropathy    Continue gabapentin. ?diabetic neuropathy      Essential hypertension    Chronic, stable. Continue current regimen.      Diabetic peripheral vascular disease ([place])    Continue tight glycemic control.  Continue aspirin and statin. Known carotid stenosis s/p bilat CEA, AAA.       Diabetes mellitus type 2 with neurological manifestations ([place])    Great control on current regimen. Foot exam today - also sees eye doc. Updated vision exam. Endorses occasional dizzy episodes - will decrease amaryl to 2mg  BID, stressed importance of taking with food. Pt agrees with plan.      Coronary atherosclerosis    Asxs. Continue aspirin, statin.      AAA (abdominal aortic aneurysm) without rupture ([place]) - Primary    Overdue for rpt ultrasound - ordered today.      Relevant Orders   AAA Duplex    Other Visit Diagnoses    Need for influenza vaccination  Relevant Orders    Flu Vaccine QUAD 36+ mos PF IM (Fluarix & Fluzone Quad PF) (Completed)        Follow up plan: Return in about 3 months (around 12/07/2015), or as needed, for medicare wellness visit.

## 2015-09-06 NOTE — Patient Instructions (Addendum)
Flu shot today We will refer you back for abdominal aneurysm screen. Pass by our referral coordinators for this. Decrease amaryl (glimepiride) to 2mg  twice daily with meals.  Return at your convenience for medicare wellness visit with fasting labs prior.

## 2015-09-06 NOTE — Assessment & Plan Note (Signed)
Great control on current regimen. Foot exam today - also sees eye doc. Updated vision exam. Endorses occasional dizzy episodes - will decrease amaryl to 2mg  BID, stressed importance of taking with food. Pt agrees with plan.

## 2015-09-14 ENCOUNTER — Other Ambulatory Visit: Payer: Self-pay | Admitting: *Deleted

## 2015-09-14 MED ORDER — TRAMADOL HCL 50 MG PO TABS: ORAL_TABLET | ORAL | Status: DC

## 2015-09-14 NOTE — Telephone Encounter (Signed)
plz phone in. 

## 2015-09-14 NOTE — Telephone Encounter (Signed)
Ok to refill 

## 2015-09-14 NOTE — Telephone Encounter (Signed)
Rx called in as directed.   

## 2015-09-16 ENCOUNTER — Ambulatory Visit (HOSPITAL_COMMUNITY)
Admission: RE | Admit: 2015-09-16 | Discharge: 2015-09-16 | Disposition: A | Discharge status: Home / Self Care | Payer: Medicare Other | Source: Ambulatory Visit | Attending: Family Medicine | Admitting: Family Medicine

## 2015-09-16 DIAGNOSIS — E119 Type 2 diabetes mellitus without complications: Secondary | ICD-10-CM | POA: Insufficient documentation

## 2015-09-16 DIAGNOSIS — E785 Hyperlipidemia, unspecified: Secondary | ICD-10-CM | POA: Insufficient documentation

## 2015-09-16 DIAGNOSIS — I10 Essential (primary) hypertension: Secondary | ICD-10-CM | POA: Diagnosis not present

## 2015-09-16 DIAGNOSIS — I714 Abdominal aortic aneurysm, without rupture, unspecified: Secondary | ICD-10-CM

## 2015-09-16 DIAGNOSIS — I739 Peripheral vascular disease, unspecified: Secondary | ICD-10-CM | POA: Insufficient documentation

## 2015-09-17 ENCOUNTER — Encounter: Payer: Self-pay | Admitting: Family Medicine

## 2015-09-20 ENCOUNTER — Other Ambulatory Visit: Payer: Self-pay | Admitting: *Deleted

## 2015-09-20 ENCOUNTER — Encounter: Payer: Self-pay | Admitting: Family Medicine

## 2015-09-20 MED ORDER — METFORMIN HCL 1000 MG PO TABS: ORAL_TABLET | ORAL | Status: DC

## 2015-09-27 ENCOUNTER — Other Ambulatory Visit: Payer: Self-pay | Admitting: *Deleted

## 2015-09-27 MED ORDER — GABAPENTIN 300 MG PO CAPS: 600.0000 mg | ORAL_CAPSULE | Freq: Three times a day (TID) | ORAL | Status: DC

## 2015-09-27 MED ORDER — METFORMIN HCL 1000 MG PO TABS: ORAL_TABLET | ORAL | Status: DC

## 2015-10-03 ENCOUNTER — Ambulatory Visit: Payer: Medicare Other | Admitting: Emergency Medicine

## 2015-10-03 ENCOUNTER — Encounter: Payer: Self-pay | Admitting: Family Medicine

## 2015-10-03 MED ORDER — LOSARTAN POTASSIUM 50 MG PO TABS: ORAL_TABLET | ORAL | Status: DC

## 2015-10-22 ENCOUNTER — Other Ambulatory Visit: Payer: Self-pay | Admitting: Family Medicine

## 2015-10-25 ENCOUNTER — Encounter: Payer: Self-pay | Admitting: Emergency Medicine

## 2015-10-25 ENCOUNTER — Ambulatory Visit (INDEPENDENT_AMBULATORY_CARE_PROVIDER_SITE_OTHER): Payer: Medicare Other | Admitting: Emergency Medicine

## 2015-10-25 VITALS — BP 112/66 | HR 73 | Ht 69.0 in | Wt 233.0 lb

## 2015-10-25 DIAGNOSIS — J449 Chronic obstructive pulmonary disease, unspecified: Secondary | ICD-10-CM | POA: Diagnosis not present

## 2015-10-25 NOTE — Assessment & Plan Note (Signed)
Despite severe obstruction on spirometry he continues to be very functional and has few symptoms. We will continue albuterol when necessary, defer scheduled bronchodilators for now. Flu shot up-to date

## 2015-10-25 NOTE — Progress Notes (Signed)
Subjective:                                  Patient ID: [name], male    DOB: [birth year], 69 y.o.   MRN: [id number]  Shortness of Breath Pertinent negatives include no ear pain, fever, headaches, leg swelling, rash, rhinorrhea, sore throat, vomiting or wheezing.   69 yo man, former smoker (80 pack years), history of hypertension, GERD, diabetes and renal insufficiency. He also carries a history of asthma diagnosed with possible asthma, given albuterol. He has had some exertional SOB for about 2 years. Has been progressive and has been worse since last 6-12 months. His wt has been stable, he has neuropathy and arthritis that have impacted his activity level. No real cough, he does hear some UA wheezing.   ROB 03/28/15 -- follow-up visit for dyspnea, patient with a history of tobacco use, hypertension, diabetes, renal insufficiency. At our last visit based on suspicion that he has some degree of COPD he was started on Spiriva. He has not notice a significant change in his breathing or exertional tolerance.   He underwent PFT today that I reviewed personally and discussed with him. These show severe airflow limitation with a borderline bronchodilator response does not meet statistical significance. His lung volumes were normal suggesting pseudonormalization and a possible component of restriction as well. His DLCO was decreased and corrected into the normal range when adjusted for alveolar volume.  ROV 10/25/15 -- follow-up visit for severe COPD, severe obstruction on spirometry.  We had tried him on Spiriva, but he didn't benefit. He has been using albuterol as needed, averaging approximately. He believes that his breathing has been doing fairly well, but he notices reliable dyspnea most mornings. For the last few weeks he has had more cough and drainage, clear nasal gtt. Coughing up clear. Using albuterol a few times a week, usually in the am. He has to stop and rest due to hip pain. He has lost  about 16 lbs.    Review of Systems  Constitutional: Negative for fever, appetite change and unexpected weight change.  HENT: Negative for congestion, dental problem, ear pain, nosebleeds, postnasal drip, rhinorrhea, sinus pressure, sneezing, sore throat and trouble swallowing.   Eyes: Negative for redness and itching.  Respiratory: Positive for shortness of breath. Negative for cough, chest tightness and wheezing.   Cardiovascular: Negative for palpitations and leg swelling.  Gastrointestinal: Negative for nausea and vomiting.  Genitourinary: Negative for dysuria.  Musculoskeletal: Positive for gait problem. Negative for joint swelling.  Skin: Negative for rash.  Neurological: Negative for headaches.  Hematological: Does not bruise/bleed easily.  Psychiatric/Behavioral: Negative for dysphoric mood. The patient is nervous/anxious.         Objective:   Physical Exam Filed Vitals:   10/25/15 1522  BP: 112/66  Pulse: 73  Height: 5\' 9"  (1.753 m)  Weight: 233 lb (105.688 kg)  SpO2: 94%   Gen: Pleasant, well-nourished, in no distress,  normal affect  ENT: No lesions,  mouth clear,  oropharynx clear, no postnasal drip  Neck: No JVD, no TMG, no carotid bruits  Lungs: No use of accessory muscles, clear without rales or rhonchi  Cardiovascular: RRR, heart sounds normal, no murmur or gallops, no peripheral edema  Musculoskeletal: No deformities, no cyanosis or clubbing  Neuro: alert, non focal  Skin: Warm, no lesions or rashes      Assessment & Plan:  COPD (chronic obstructive pulmonary disease) Despite severe obstruction on spirometry he continues to be very functional and has few symptoms. We will continue albuterol when necessary, defer scheduled bronchodilators for now. Flu shot up-to date

## 2015-10-25 NOTE — Patient Instructions (Signed)
Please continue albuterol 2 puffs as needed for shortness of breath Flu shot up to day Follow with Dr Lamonte Sakai in 6 months or sooner if you have any problems

## 2015-11-03 ENCOUNTER — Other Ambulatory Visit: Payer: Self-pay | Admitting: *Deleted

## 2015-11-03 NOTE — Telephone Encounter (Signed)
Ok to refill? Will need written Rx to fax to mail order.

## 2015-11-04 MED ORDER — TRAMADOL HCL 50 MG PO TABS: ORAL_TABLET | ORAL | Status: DC

## 2015-11-04 NOTE — Telephone Encounter (Signed)
Rx faxed to mail order--pt is aware

## 2015-11-04 NOTE — Telephone Encounter (Signed)
Printed and in Kim's box 

## 2015-11-10 ENCOUNTER — Telehealth: Payer: Self-pay | Admitting: Family Medicine

## 2015-11-10 NOTE — Telephone Encounter (Signed)
PCP already approved the patients wife

## 2015-11-10 NOTE — Telephone Encounter (Signed)
Fine with me

## 2015-11-10 NOTE — Telephone Encounter (Signed)
Fine by me. Thanks!

## 2015-11-10 NOTE — Telephone Encounter (Signed)
Caller name:shirley  Relationship to patient: spouse   Can be reached: [phone]    Reason for call: spouse called in because she would like to have her husband reestablished with Dr. Jacinto Reap. She says that they switched to a different provider because they were closer to there home but now wishes to switch back . Scheduled pt a np appt. But would like to be sure that Dr. B is okay with taking pt back on as pt.

## 2015-11-11 NOTE — Telephone Encounter (Signed)
Updated PCP in chart and patient/wife informed change ok per previous PCP and [name].

## 2015-11-15 ENCOUNTER — Other Ambulatory Visit: Payer: Self-pay | Admitting: *Deleted

## 2015-11-15 MED ORDER — TRAMADOL HCL 50 MG PO TABS: ORAL_TABLET | ORAL | Status: DC

## 2015-11-15 NOTE — Telephone Encounter (Deleted)
Ok to refill? Will need written Rx for mail order.

## 2015-11-15 NOTE — Addendum Note (Signed)
Addended by: Sharon Seller B on: 11/15/2015 02:02 PM   Modules accepted: Orders

## 2015-11-15 NOTE — Telephone Encounter (Signed)
Faxed hardcopy for Tramadol to [facility] per pt. Request.

## 2015-11-15 NOTE — Telephone Encounter (Signed)
OK to refill Tramadol with same sig, same number same strength

## 2015-11-15 NOTE — Telephone Encounter (Signed)
Printed hardcopy as instructed and on counter for signature.

## 2015-11-22 NOTE — Telephone Encounter (Addendum)
Sonia Side with [facility] left v/m requesting cb about unsigned tramadol rx that was sent in; use ref # [id number].

## 2015-11-22 NOTE — Telephone Encounter (Signed)
Located original hardcopy faxed to [facility].  PCP had signed prescription, but used a blue ink pen and evidently on their end did not show up.  Did call [facility] and gave a verbal refill of this medication.

## 2015-11-23 ENCOUNTER — Ambulatory Visit (INDEPENDENT_AMBULATORY_CARE_PROVIDER_SITE_OTHER): Payer: Medicare Other | Admitting: Podiatry

## 2015-11-23 ENCOUNTER — Encounter: Payer: Self-pay | Admitting: Podiatry

## 2015-11-23 DIAGNOSIS — L84 Corns and callosities: Secondary | ICD-10-CM | POA: Diagnosis not present

## 2015-11-23 DIAGNOSIS — M79673 Pain in unspecified foot: Secondary | ICD-10-CM

## 2015-11-23 DIAGNOSIS — G63 Polyneuropathy in diseases classified elsewhere: Secondary | ICD-10-CM

## 2015-11-23 DIAGNOSIS — B351 Tinea unguium: Secondary | ICD-10-CM | POA: Diagnosis not present

## 2015-11-23 NOTE — Patient Instructions (Signed)
Diabetes and Foot Care Diabetes may cause you to have problems because of poor blood supply (circulation) to your feet and legs. This may cause the skin on your feet to become thinner, break easier, and heal more slowly. Your skin may become dry, and the skin may peel and crack. You may also have nerve damage in your legs and feet causing decreased feeling in them. You may not notice minor injuries to your feet that could lead to infections or more serious problems. Taking care of your feet is one of the most important things you can do for yourself.  HOME CARE INSTRUCTIONS  Wear shoes at all times, even in the house. Do not go barefoot. Bare feet are easily injured.  Check your feet daily for blisters, cuts, and redness. If you cannot see the bottom of your feet, use a mirror or ask someone for help.  Wash your feet with warm water (do not use hot water) and mild soap. Then pat your feet and the areas between your toes until they are completely dry. Do not soak your feet as this can dry your skin.  Apply a moisturizing lotion or petroleum jelly (that does not contain alcohol and is unscented) to the skin on your feet and to dry, brittle toenails. Do not apply lotion between your toes.  Trim your toenails straight across. Do not dig under them or around the cuticle. File the edges of your nails with an emery board or nail file.  Do not cut corns or calluses or try to remove them with medicine.  Wear clean socks or stockings every day. Make sure they are not too tight. Do not wear knee-high stockings since they may decrease blood flow to your legs.  Wear shoes that fit properly and have enough cushioning. To break in new shoes, wear them for just a few hours a day. This prevents you from injuring your feet. Always look in your shoes before you put them on to be sure there are no objects inside.  Do not cross your legs. This may decrease the blood flow to your feet.  If you find a minor scrape,  cut, or break in the skin on your feet, keep it and the skin around it clean and dry. These areas may be cleansed with mild soap and water. Do not cleanse the area with peroxide, alcohol, or iodine.  When you remove an adhesive bandage, be sure not to damage the skin around it.  If you have a wound, look at it several times a day to make sure it is healing.  Do not use heating pads or hot water bottles. They may burn your skin. If you have lost feeling in your feet or legs, you may not know it is happening until it is too late.  Make sure your health care provider performs a complete foot exam at least annually or more often if you have foot problems. Report any cuts, sores, or bruises to your health care provider immediately. SEEK MEDICAL CARE IF:   You have an injury that is not healing.  You have cuts or breaks in the skin.  You have an ingrown nail.  You notice redness on your legs or feet.  You feel burning or tingling in your legs or feet.  You have pain or cramps in your legs and feet.  Your legs or feet are numb.  Your feet always feel cold. SEEK IMMEDIATE MEDICAL CARE IF:   There is increasing redness,   swelling, or pain in or around a wound.  There is a red line that goes up your leg.  Pus is coming from a wound.  You develop a fever or as directed by your health care provider.  You notice a bad smell coming from an ulcer or wound.   This information is not intended to replace advice given to you by your health care provider. Make sure you discuss any questions you have with your health care provider.   Document Released: 10/19/2000 Document Revised: 06/24/2013 Document Reviewed: 03/31/2013 Elsevier Interactive Patient Education 2016 [employer]  

## 2015-11-23 NOTE — Progress Notes (Signed)
Patient ID: [name], male   DOB: Feb 02, 1946, 70 y.o.   MRN: [id number]  Subjective: Patient presents today complaining of elongated and thickened toenails which or cough walking wearing shoes and requests nail debridement. Also has concern about a callus on the left hallux  Objective: No open skin lesions bilaterally Large well-organized callus medial left hallux with small amount of bleeding within the callus that remains closed after debridement The toenails are elongated, brittle, deformed, discolored and tender direct palpation 6-10  Assessment: Diabetic with peripheral neuropathy Symptomatic onychomycoses 6-10 Pre-ulcerative callus left hallux  Plan: Debridement toenails 6-10 mechanical and electrical without a bleeding Debrided pre-ulcerative callus left 1 without a bleeding Advised patient apply Vaseline to the left hallux toenails  Reappoint 3 months

## 2015-11-26 ENCOUNTER — Other Ambulatory Visit: Payer: Self-pay | Admitting: Emergency Medicine

## 2015-11-26 ENCOUNTER — Other Ambulatory Visit: Payer: Self-pay | Admitting: Family Medicine

## 2015-11-28 ENCOUNTER — Other Ambulatory Visit: Payer: Self-pay | Admitting: Family Medicine

## 2015-11-28 MED ORDER — CARVEDILOL 25 MG PO TABS: ORAL_TABLET | ORAL | Status: DC

## 2015-11-28 MED ORDER — LOSARTAN POTASSIUM 50 MG PO TABS: ORAL_TABLET | ORAL | Status: DC

## 2015-11-28 MED ORDER — CLONIDINE HCL 0.2 MG PO TABS: ORAL_TABLET | ORAL | Status: DC

## 2015-11-28 MED ORDER — HYDROCHLOROTHIAZIDE 12.5 MG PO CAPS: ORAL_CAPSULE | ORAL | Status: DC

## 2015-11-30 ENCOUNTER — Other Ambulatory Visit: Payer: Medicare Other

## 2015-12-03 ENCOUNTER — Other Ambulatory Visit: Payer: Self-pay | Admitting: Family Medicine

## 2015-12-07 ENCOUNTER — Encounter: Payer: Medicare Other | Admitting: Family Medicine

## 2016-01-01 ENCOUNTER — Ambulatory Visit (INDEPENDENT_AMBULATORY_CARE_PROVIDER_SITE_OTHER): Payer: Medicare Other | Admitting: Physician Assistant

## 2016-01-01 ENCOUNTER — Ambulatory Visit (INDEPENDENT_AMBULATORY_CARE_PROVIDER_SITE_OTHER): Payer: Medicare Other

## 2016-01-01 VITALS — BP 132/68 | HR 72 | Temp 98.7°F | Resp 18 | Ht 69.0 in | Wt 237.2 lb

## 2016-01-01 DIAGNOSIS — M25532 Pain in left wrist: Secondary | ICD-10-CM
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
## 2016-01-01 NOTE — Patient Instructions (Signed)
Apply ice through the splint. If it's not significantly improved in 7-10 days, we'll want to repeat the xray.

## 2016-02-22 ENCOUNTER — Ambulatory Visit (INDEPENDENT_AMBULATORY_CARE_PROVIDER_SITE_OTHER): Payer: Medicare Other | Admitting: Podiatry

## 2016-02-22 ENCOUNTER — Encounter: Payer: Self-pay | Admitting: Podiatry

## 2016-02-22 DIAGNOSIS — M79674 Pain in right toe(s): Secondary | ICD-10-CM | POA: Diagnosis not present

## 2016-02-22 DIAGNOSIS — M79675 Pain in left toe(s): Secondary | ICD-10-CM

## 2016-02-22 DIAGNOSIS — B351 Tinea unguium: Secondary | ICD-10-CM

## 2016-02-22 DIAGNOSIS — Q828 Other specified congenital malformations of skin: Secondary | ICD-10-CM | POA: Diagnosis not present

## 2016-02-22 DIAGNOSIS — E1149 Type 2 diabetes mellitus with other diabetic neurological complication: Secondary | ICD-10-CM

## 2016-02-22 NOTE — Patient Instructions (Signed)
Diabetes and Foot Care Diabetes may cause you to have problems because of poor blood supply (circulation) to your feet and legs. This may cause the skin on your feet to become thinner, break easier, and heal more slowly. Your skin may become dry, and the skin may peel and crack. You may also have nerve damage in your legs and feet causing decreased feeling in them. You may not notice minor injuries to your feet that could lead to infections or more serious problems. Taking care of your feet is one of the most important things you can do for yourself.  HOME CARE INSTRUCTIONS  Wear shoes at all times, even in the house. Do not go barefoot. Bare feet are easily injured.  Check your feet daily for blisters, cuts, and redness. If you cannot see the bottom of your feet, use a mirror or ask someone for help.  Wash your feet with warm water (do not use hot water) and mild soap. Then pat your feet and the areas between your toes until they are completely dry. Do not soak your feet as this can dry your skin.  Apply a moisturizing lotion or petroleum jelly (that does not contain alcohol and is unscented) to the skin on your feet and to dry, brittle toenails. Do not apply lotion between your toes.  Trim your toenails straight across. Do not dig under them or around the cuticle. File the edges of your nails with an emery board or nail file.  Do not cut corns or calluses or try to remove them with medicine.  Wear clean socks or stockings every day. Make sure they are not too tight. Do not wear knee-high stockings since they may decrease blood flow to your legs.  Wear shoes that fit properly and have enough cushioning. To break in new shoes, wear them for just a few hours a day. This prevents you from injuring your feet. Always look in your shoes before you put them on to be sure there are no objects inside.  Do not cross your legs. This may decrease the blood flow to your feet.  If you find a minor scrape,  cut, or break in the skin on your feet, keep it and the skin around it clean and dry. These areas may be cleansed with mild soap and water. Do not cleanse the area with peroxide, alcohol, or iodine.  When you remove an adhesive bandage, be sure not to damage the skin around it.  If you have a wound, look at it several times a day to make sure it is healing.  Do not use heating pads or hot water bottles. They may burn your skin. If you have lost feeling in your feet or legs, you may not know it is happening until it is too late.  Make sure your health care provider performs a complete foot exam at least annually or more often if you have foot problems. Report any cuts, sores, or bruises to your health care provider immediately. SEEK MEDICAL CARE IF:   You have an injury that is not healing.  You have cuts or breaks in the skin.  You have an ingrown nail.  You notice redness on your legs or feet.  You feel burning or tingling in your legs or feet.  You have pain or cramps in your legs and feet.  Your legs or feet are numb.  Your feet always feel cold. SEEK IMMEDIATE MEDICAL CARE IF:   There is increasing redness,   swelling, or pain in or around a wound.  There is a red line that goes up your leg.  Pus is coming from a wound.  You develop a fever or as directed by your health care provider.  You notice a bad smell coming from an ulcer or wound.   This information is not intended to replace advice given to you by your health care provider. Make sure you discuss any questions you have with your health care provider.   Document Released: 10/19/2000 Document Revised: 06/24/2013 Document Reviewed: 03/31/2013 Elsevier Interactive Patient Education 2016 [employer]  

## 2016-02-22 NOTE — Progress Notes (Signed)
Patient ID: [name], male   DOB: 1946/09/01, 70 y.o.   MRN: [id number]   Subjective: Patient presents today complaining of elongated and thickened toenails which are uncomfortable when walking wearing shoes and requests nail debridement. Also has concern about a callus on the left hallux. Today patient is also requesting a replacement diabetic shoes  Objective: DP pulses 2/4 right 0/4 left PT pulse right 2/4 and 1/4 left Capillary reflex immediate bilaterally Sensation to 10 g monofilament wire intact 4/5 right and 2/5 left Vibratory sensation nonreactive bilaterally Ankle reflex equal and reactive bilaterally HAV bilaterally No open skin lesions bilaterally Large well-organized callus medial left hallux with small amount of bleeding within the callus that remains closed after debridement The toenails are elongated, brittle, deformed, discolored and tender direct palpation 6-10  Assessment: Diabetic with peripheral neuropathy Symptomatic onychomycoses 6-10 Pre-ulcerative callus left hallux  Plan: Debridement toenails 6-10 mechanical and electrical without a bleeding Debrided pre-ulcerative callus left 1 without any bleeding  Indications for diabetic shoes Type II diabetic with neuropathy Hallux valgus bilaterally Loss of vibratory sensation Loss of protective sensation Pre-ulcerative callus left hallux   Reappoint 3 months for skin a nail debridement and notify patient upon certification by [name] for diabetic shoes

## 2016-03-05 ENCOUNTER — Other Ambulatory Visit: Payer: Self-pay | Admitting: Family Medicine

## 2016-03-05 MED ORDER — TRAMADOL HCL 50 MG PO TABS: ORAL_TABLET | ORAL | Status: DC

## 2016-03-06 NOTE — Telephone Encounter (Signed)
Faxed hardcopy for Tramadol to [place]

## 2016-03-19 ENCOUNTER — Telehealth: Payer: Self-pay | Admitting: *Deleted

## 2016-03-19 NOTE — Telephone Encounter (Signed)
Forwarded to Dr. Blyth. JG//CMA  

## 2016-03-26 DIAGNOSIS — L570 Actinic keratosis: Secondary | ICD-10-CM | POA: Diagnosis not present

## 2016-03-26 DIAGNOSIS — L409 Psoriasis, unspecified: Secondary | ICD-10-CM | POA: Diagnosis not present

## 2016-04-06 ENCOUNTER — Other Ambulatory Visit: Payer: Self-pay | Admitting: Family Medicine

## 2016-04-06 MED ORDER — TRAMADOL HCL 50 MG PO TABS: ORAL_TABLET | ORAL | Status: DC

## 2016-04-06 NOTE — Telephone Encounter (Signed)
Last refill 03/05/2016  #120 with 0 refills Last office visit 05/12/2015 and Next scheduled office visit is July 2017 Tramadol refill request to go to [facility]

## 2016-04-06 NOTE — Telephone Encounter (Signed)
Faxed successfully to [place]. Sent for scanning. JG//CMA

## 2016-04-06 NOTE — Telephone Encounter (Signed)
Faxed hardcopy for Tramadol to [facility]. 

## 2016-04-12 ENCOUNTER — Other Ambulatory Visit: Payer: Self-pay | Admitting: Family Medicine

## 2016-04-17 ENCOUNTER — Other Ambulatory Visit: Payer: Self-pay | Admitting: Family Medicine

## 2016-04-17 ENCOUNTER — Encounter: Payer: Self-pay | Admitting: Family Medicine

## 2016-04-17 MED ORDER — GABAPENTIN 300 MG PO CAPS: 600.0000 mg | ORAL_CAPSULE | Freq: Three times a day (TID) | ORAL | Status: DC

## 2016-04-17 NOTE — Telephone Encounter (Signed)
Patient contacted (left detailed message) refill done, but needs appt. Before refill runs out so to call the office asap to schedule appt.

## 2016-04-17 NOTE — Telephone Encounter (Signed)
He can have one last refill on the Gabapentin but he has not been seen since July of 2016 so he will need an appt before this runs out for ongoing med management

## 2016-04-23 ENCOUNTER — Encounter: Payer: Self-pay | Admitting: Emergency Medicine

## 2016-04-23 ENCOUNTER — Ambulatory Visit (INDEPENDENT_AMBULATORY_CARE_PROVIDER_SITE_OTHER): Payer: Medicare Other | Admitting: Emergency Medicine

## 2016-04-23 VITALS — BP 144/88 | HR 75 | Ht 69.0 in | Wt 233.0 lb

## 2016-04-23 DIAGNOSIS — J309 Allergic rhinitis, unspecified: Secondary | ICD-10-CM | POA: Diagnosis not present

## 2016-04-23 DIAGNOSIS — J449 Chronic obstructive pulmonary disease, unspecified: Secondary | ICD-10-CM

## 2016-04-23 NOTE — Assessment & Plan Note (Signed)
Start fexofenadine 100 mg daily since benefits.

## 2016-04-23 NOTE — Patient Instructions (Signed)
Please contoinue your albuterol as needed Start fexofenadine 180mg  daily to see if it helps with mucous and cough Follow with Dr Lamonte Sakai in 12 months or sooner if you have any problems Get the flu shot in the fall

## 2016-04-23 NOTE — Progress Notes (Signed)
Subjective:                                  Patient ID: [name], male    DOB: 07/12/1946, 70 y.o.   MRN: [id number]  Shortness of Breath Pertinent negatives include no ear pain, fever, headaches, leg swelling, rash, rhinorrhea, sore throat, vomiting or wheezing.   70 yo man, former smoker (80 pack years), history of hypertension, GERD, diabetes and renal insufficiency. He also carries a history of asthma diagnosed with possible asthma, given albuterol. He has had some exertional SOB for about 2 years. Has been progressive and has been worse since last 6-12 months. His wt has been stable, he has neuropathy and arthritis that have impacted his activity level. No real cough, he does hear some UA wheezing.   ROB 03/28/15 -- follow-up visit for dyspnea, patient with a history of tobacco use, hypertension, diabetes, renal insufficiency. At our last visit based on suspicion that he has some degree of COPD he was started on Spiriva. He has not notice a significant change in his breathing or exertional tolerance.   He underwent PFT today that I reviewed personally and discussed with him. These show severe airflow limitation with a borderline bronchodilator response does not meet statistical significance. His lung volumes were normal suggesting pseudonormalization and a possible component of restriction as well. His DLCO was decreased and corrected into the normal range when adjusted for alveolar volume.  ROV 10/25/15 -- follow-up visit for severe COPD, severe obstruction on spirometry.  We had tried him on Spiriva, but he didn't benefit. He has been using albuterol as needed, averaging approximately. He believes that his breathing has been doing fairly well, but he notices reliable dyspnea most mornings. For the last few weeks he has had more cough and drainage, clear nasal gtt. Coughing up clear. Using albuterol a few times a week, usually in the am. He has to stop and rest due to hip pain. He has lost  about 16 lbs.   ROV 04/23/16 -- patient has a history of severe COPD confirmed severe obstruction on spirometry. He has daily dyspnea with exertion, cough productive of clear mucus, usually in the am. He has albuterol available to use as needed - uses it about 5x a month, usually after some heavier exertion.   No flowsheet data found.   Review of Systems  Constitutional: Negative for fever, appetite change and unexpected weight change.  HENT: Negative for congestion, dental problem, ear pain, nosebleeds, postnasal drip, rhinorrhea, sinus pressure, sneezing, sore throat and trouble swallowing.   Eyes: Negative for redness and itching.  Respiratory: Positive for shortness of breath. Negative for cough, chest tightness and wheezing.   Cardiovascular: Negative for palpitations and leg swelling.  Gastrointestinal: Negative for nausea and vomiting.  Genitourinary: Negative for dysuria.  Musculoskeletal: Positive for gait problem. Negative for joint swelling.  Skin: Negative for rash.  Neurological: Negative for headaches.  Hematological: Does not bruise/bleed easily.  Psychiatric/Behavioral: Negative for dysphoric mood. The patient is nervous/anxious.         Objective:   Physical Exam Filed Vitals:   04/23/16 0855  BP: 144/88  Pulse: 75  Height: 5\' 9"  (1.753 m)  Weight: 233 lb (105.688 kg)  SpO2: 93%   Gen: Pleasant, well-nourished, in no distress,  normal affect  ENT: No lesions,  mouth clear,  oropharynx clear, no postnasal drip  Neck: No JVD, no  TMG, no carotid bruits  Lungs: No use of accessory muscles, clear without rales or rhonchi  Cardiovascular: RRR, heart sounds normal, no murmur or gallops, no peripheral edema  Musculoskeletal: No deformities, no cyanosis or clubbing  Neuro: alert, non focal  Skin: Warm, no lesions or rashes      Assessment & Plan:  COPD (chronic obstructive pulmonary disease) Overall clinically stable. He still has. Episodes of exertional  dyspnea. He is also dealing with clear mucus and occasional cough. Suspect impact of allergic rhinitis. Given his minimal symptoms out of her starting a scheduled bronchodilator at this time. I suspect that he'll need one in the future. Continue albuterol when necessary.  Allergic rhinitis Start fexofenadine 100 mg daily since benefits.   Baltazar Apo, MD, PhD 04/23/2016, 9:15 AM [facility] [phone] or if no answer [phone]

## 2016-04-23 NOTE — Assessment & Plan Note (Signed)
Overall clinically stable. He still has. Episodes of exertional dyspnea. He is also dealing with clear mucus and occasional cough. Suspect impact of allergic rhinitis. Given his minimal symptoms out of her starting a scheduled bronchodilator at this time. I suspect that he'll need one in the future. Continue albuterol when necessary.

## 2016-05-07 ENCOUNTER — Ambulatory Visit (INDEPENDENT_AMBULATORY_CARE_PROVIDER_SITE_OTHER): Payer: Medicare Other | Admitting: Family Medicine

## 2016-05-07 ENCOUNTER — Encounter: Payer: Self-pay | Admitting: Family Medicine

## 2016-05-07 VITALS — BP 112/64 | HR 86 | Temp 98.8°F | Ht 69.0 in | Wt 228.4 lb

## 2016-05-07 DIAGNOSIS — I10 Essential (primary) hypertension: Secondary | ICD-10-CM | POA: Diagnosis not present

## 2016-05-07 DIAGNOSIS — E1149 Type 2 diabetes mellitus with other diabetic neurological complication: Secondary | ICD-10-CM | POA: Diagnosis not present

## 2016-05-07 DIAGNOSIS — Z0000 Encounter for general adult medical examination without abnormal findings: Secondary | ICD-10-CM | POA: Diagnosis not present

## 2016-05-07 DIAGNOSIS — E1151 Type 2 diabetes mellitus with diabetic peripheral angiopathy without gangrene: Secondary | ICD-10-CM

## 2016-05-07 DIAGNOSIS — K219 Gastro-esophageal reflux disease without esophagitis: Secondary | ICD-10-CM

## 2016-05-07 DIAGNOSIS — Z7289 Other problems related to lifestyle: Secondary | ICD-10-CM | POA: Diagnosis not present

## 2016-05-07 DIAGNOSIS — M5126 Other intervertebral disc displacement, lumbar region: Secondary | ICD-10-CM

## 2016-05-07 DIAGNOSIS — E785 Hyperlipidemia, unspecified: Secondary | ICD-10-CM | POA: Diagnosis not present

## 2016-05-07 LAB — COMPREHENSIVE METABOLIC PANEL
ALT: 19 U/L (ref 9–46)
AST: 18 U/L (ref 10–35)
Albumin: 4.5 g/dL (ref 3.6–5.1)
Alkaline Phosphatase: 70 U/L (ref 40–115)
BUN: 18 mg/dL (ref 7–25)
CO2: 30 mmol/L (ref 20–31)
Calcium: 10.2 mg/dL (ref 8.6–10.3)
Chloride: 102 mmol/L (ref 98–110)
Creat: 0.78 mg/dL (ref 0.70–1.25)
Glucose, Bld: 60 mg/dL — ABNORMAL LOW (ref 65–99)
Potassium: 4.5 mmol/L (ref 3.5–5.3)
Sodium: 143 mmol/L (ref 135–146)
Total Bilirubin: 0.4 mg/dL (ref 0.2–1.2)
Total Protein: 7.1 g/dL (ref 6.1–8.1)

## 2016-05-07 LAB — CBC
HCT: 42.9 % (ref 38.5–50.0)
Hemoglobin: 14.8 g/dL (ref 13.2–17.1)
MCH: 31.6 pg (ref 27.0–33.0)
MCHC: 34.5 g/dL (ref 32.0–36.0)
MCV: 91.5 fL (ref 80.0–100.0)
MPV: 9.9 fL (ref 7.5–12.5)
Platelets: 268 10*3/uL (ref 140–400)
RBC: 4.69 MIL/uL (ref 4.20–5.80)
RDW: 13.7 % (ref 11.0–15.0)
WBC: 9.3 10*3/uL (ref 3.8–10.8)

## 2016-05-07 LAB — HEMOGLOBIN A1C
Hgb A1c MFr Bld: 6.7 % — ABNORMAL HIGH (ref <5.7)
Mean Plasma Glucose: 146 mg/dL

## 2016-05-07 LAB — LIPID PANEL
Cholesterol: 146 mg/dL (ref 125–200)
HDL: 31 mg/dL — ABNORMAL LOW (ref >=40)
LDL Cholesterol: 87 mg/dL (ref <130)
Total CHOL/HDL Ratio: 4.7 Ratio (ref <=5.0)
Triglycerides: 141 mg/dL (ref <150)
VLDL: 28 mg/dL (ref <30)

## 2016-05-07 LAB — TSH: TSH: 2.06 mIU/L (ref 0.40–4.50)

## 2016-05-07 NOTE — Patient Instructions (Addendum)
NOW probiotic 10 strain cap 1 daily  Back Pain, Adult Back pain is very common in adults.The cause of back pain is rarely dangerous and the pain often gets better over time.The cause of your back pain may not be known. Some common causes of back pain include:  Strain of the muscles or ligaments supporting the spine.  Wear and tear (degeneration) of the spinal disks.  Arthritis.  Direct injury to the back. For many people, back pain may return. Since back pain is rarely dangerous, most people can learn to manage this condition on their own. HOME CARE INSTRUCTIONS Watch your back pain for any changes. The following actions may help to lessen any discomfort you are feeling:  Remain active. It is stressful on your back to sit or stand in one place for long periods of time. Do not sit, drive, or stand in one place for more than 30 minutes at a time. Take short walks on even surfaces as soon as you are able.Try to increase the length of time you walk each day.  Exercise regularly as directed by your health care provider. Exercise helps your back heal faster. It also helps avoid future injury by keeping your muscles strong and flexible.  Do not stay in bed.Resting more than 1-2 days can delay your recovery.  Pay attention to your body when you bend and lift. The most comfortable positions are those that put less stress on your recovering back. Always use proper lifting techniques, including:  Bending your knees.  Keeping the load close to your body.  Avoiding twisting.  Find a comfortable position to sleep. Use a firm mattress and lie on your side with your knees slightly bent. If you lie on your back, put a pillow under your knees.  Avoid feeling anxious or stressed.Stress increases muscle tension and can worsen back pain.It is important to recognize when you are anxious or stressed and learn ways to manage it, such as with exercise.  Take medicines only as directed by your health  care provider. Over-the-counter medicines to reduce pain and inflammation are often the most helpful.Your health care provider may prescribe muscle relaxant drugs.These medicines help dull your pain so you can more quickly return to your normal activities and healthy exercise.  Apply ice to the injured area:  Put ice in a plastic bag.  Place a towel between your skin and the bag.  Leave the ice on for 20 minutes, 2-3 times a day for the first 2-3 days. After that, ice and heat may be alternated to reduce pain and spasms.  Maintain a healthy weight. Excess weight puts extra stress on your back and makes it difficult to maintain good posture. SEEK MEDICAL CARE IF:  You have pain that is not relieved with rest or medicine.  You have increasing pain going down into the legs or buttocks.  You have pain that does not improve in one week.  You have night pain.  You lose weight.  You have a fever or chills. SEEK IMMEDIATE MEDICAL CARE IF:   You develop new bowel or bladder control problems.  You have unusual weakness or numbness in your arms or legs.  You develop nausea or vomiting.  You develop abdominal pain.  You feel faint.   This information is not intended to replace advice given to you by your health care provider. Make sure you discuss any questions you have with your health care provider.   Document Released: 10/22/2005 Document Revised: 11/12/2014 Document  Reviewed: 02/23/2014 Elsevier Interactive Patient Education [employer].

## 2016-05-07 NOTE — Assessment & Plan Note (Signed)
Encouraged increased hydration, 64 ounces of clear fluids daily. Minimize alcohol and caffeine. Eat small frequent meals with lean proteins and complex carbs. Avoid high and low blood sugars. Get adequate sleep, 7-8 hours a night. Needs exercise daily preferably in the morning.  

## 2016-05-07 NOTE — Progress Notes (Signed)
Pre visit review using our clinic review tool, if applicable. No additional management support is needed unless otherwise documented below in the visit note. 

## 2016-05-08 LAB — HEPATITIS C ANTIBODY: HCV Ab: NEGATIVE

## 2016-05-09 DIAGNOSIS — H401131 Primary open-angle glaucoma, bilateral, mild stage: Secondary | ICD-10-CM | POA: Diagnosis not present

## 2016-05-09 DIAGNOSIS — Z961 Presence of intraocular lens: Secondary | ICD-10-CM | POA: Diagnosis not present

## 2016-05-13 NOTE — Assessment & Plan Note (Signed)
Encouraged heart healthy diet, increase exercise, avoid trans fats, consider a krill oil cap daily 

## 2016-05-13 NOTE — Assessment & Plan Note (Signed)
hgba1c acceptable, minimize simple carbs. Increase exercise as tolerated. Continue current meds 

## 2016-05-13 NOTE — Assessment & Plan Note (Signed)
Has had some recent right hip and right thigh discomfort but is improving, encouraged moist heat, gentle stretching and topical treatments. Report worsening symptoms
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
## 2016-05-13 NOTE — Assessment & Plan Note (Signed)
Avoid offending foods, start probiotics. Do not eat large meals in late evening and consider raising head of bed.  

## 2016-05-15 DIAGNOSIS — I10 Essential (primary) hypertension: Secondary | ICD-10-CM | POA: Diagnosis not present

## 2016-05-15 DIAGNOSIS — M545 Low back pain: Secondary | ICD-10-CM | POA: Diagnosis not present

## 2016-05-17 ENCOUNTER — Other Ambulatory Visit: Payer: Self-pay | Admitting: Family Medicine

## 2016-05-18 NOTE — Telephone Encounter (Signed)
Faxed hardcopy for Tramadol to [facility]

## 2016-05-21 ENCOUNTER — Telehealth: Payer: Self-pay | Admitting: Emergency Medicine

## 2016-05-21 NOTE — Telephone Encounter (Signed)
We have no samples in the office of the proair.  i called and spoke with pts wife and she is aware.

## 2016-05-23 ENCOUNTER — Encounter: Payer: Self-pay | Admitting: Podiatry

## 2016-05-23 ENCOUNTER — Ambulatory Visit (INDEPENDENT_AMBULATORY_CARE_PROVIDER_SITE_OTHER): Payer: Medicare Other | Admitting: Podiatry

## 2016-05-23 ENCOUNTER — Encounter: Payer: Self-pay | Admitting: Family Medicine

## 2016-05-23 DIAGNOSIS — G63 Polyneuropathy in diseases classified elsewhere: Secondary | ICD-10-CM

## 2016-05-23 DIAGNOSIS — B351 Tinea unguium: Secondary | ICD-10-CM | POA: Diagnosis not present

## 2016-05-23 DIAGNOSIS — L84 Corns and callosities: Secondary | ICD-10-CM

## 2016-05-23 DIAGNOSIS — M79676 Pain in unspecified toe(s): Secondary | ICD-10-CM

## 2016-05-23 MED ORDER — ALBUTEROL SULFATE HFA 108 (90 BASE) MCG/ACT IN AERS: INHALATION_SPRAY | RESPIRATORY_TRACT | Status: DC

## 2016-05-23 NOTE — Patient Instructions (Addendum)
Today was a history of stubbing the fourth left toe with slight bleeding in the nail area. Apply topical antibiotic ointment and a Band-Aid daily until healed. If you increaseinpain,swelling,redness,drainage present to the emergency department   Diabetes and Foot Care Diabetes may cause you to have problems because of poor blood supply (circulation) to your feet and legs. This may cause the skin on your feet to become thinner, break easier, and heal more slowly. Your skin may become dry, and the skin may peel and crack. You may also have nerve damage in your legs and feet causing decreased feeling in them. You may not notice minor injuries to your feet that could lead to infections or more serious problems. Taking care of your feet is one of the most important things you can do for yourself.  HOME CARE INSTRUCTIONS  Wear shoes at all times, even in the house. Do not go barefoot. Bare feet are easily injured.  Check your feet daily for blisters, cuts, and redness. If you cannot see the bottom of your feet, use a mirror or ask someone for help.  Wash your feet with warm water (do not use hot water) and mild soap. Then pat your feet and the areas between your toes until they are completely dry. Do not soak your feet as this can dry your skin.  Apply a moisturizing lotion or petroleum jelly (that does not contain alcohol and is unscented) to the skin on your feet and to dry, brittle toenails. Do not apply lotion between your toes.  Trim your toenails straight across. Do not dig under them or around the cuticle. File the edges of your nails with an emery board or nail file.  Do not cut corns or calluses or try to remove them with medicine.  Wear clean socks or stockings every day. Make sure they are not too tight. Do not wear knee-high stockings since they may decrease blood flow to your legs.  Wear shoes that fit properly and have enough cushioning. To break in new shoes, wear them for just a few  hours a day. This prevents you from injuring your feet. Always look in your shoes before you put them on to be sure there are no objects inside.  Do not cross your legs. This may decrease the blood flow to your feet.  If you find a minor scrape, cut, or break in the skin on your feet, keep it and the skin around it clean and dry. These areas may be cleansed with mild soap and water. Do not cleanse the area with peroxide, alcohol, or iodine.  When you remove an adhesive bandage, be sure not to damage the skin around it.  If you have a wound, look at it several times a day to make sure it is healing.  Do not use heating pads or hot water bottles. They may burn your skin. If you have lost feeling in your feet or legs, you may not know it is happening until it is too late.  Make sure your health care provider performs a complete foot exam at least annually or more often if you have foot problems. Report any cuts, sores, or bruises to your health care provider immediately. SEEK MEDICAL CARE IF:   You have an injury that is not healing.  You have cuts or breaks in the skin.  You have an ingrown nail.  You notice redness on your legs or feet.  You feel burning or tingling in your legs  or feet.  You have pain or cramps in your legs and feet.  Your legs or feet are numb.  Your feet always feel cold. SEEK IMMEDIATE MEDICAL CARE IF:   There is increasing redness, swelling, or pain in or around a wound.  There is a red line that goes up your leg.  Pus is coming from a wound.  You develop a fever or as directed by your health care provider.  You notice a bad smell coming from an ulcer or wound.   This information is not intended to replace advice given to you by your health care provider. Make sure you discuss any questions you have with your health care provider.   Document Released: 10/19/2000 Document Revised: 06/24/2013 Document Reviewed: 03/31/2013 Elsevier Interactive Patient  Education [employer].

## 2016-05-23 NOTE — Progress Notes (Signed)
Patient ID: [name], male   DOB: 1946-03-21, 70 y.o.   MRN: [id number]  Subjective: Patient presents today complaining of elongated and thickened toenails which are uncomfortable when walking wearing shoes and requests nail debridement. Also has concern about a callus on the left hallux. Today patient is also requesting a replacement diabetic shoes  Objective: DP pulses 2/4 right 0/4 left PT pulse right 2/4 and 1/4 left Capillary reflex immediate bilaterally Sensation to 10 g monofilament wire intact 4/5 right and 2/5 left Vibratory sensation nonreactive bilaterally Ankle reflex equal and reactive bilaterally HAV bilaterally No open skin lesions bilaterally Large well-organized callus medial left hallux with small amount of bleeding within the callus that remains closed after debridement The toenails are elongated, brittle, deformed, discolored and tender direct palpation 6-10  Assessment: Diabetic with peripheral neuropathy Symptomatic onychomycoses 6-10 Pre-ulcerative callus left hallux  Plan: Debridement toenails 6-10 mechanical and electrical without a bleeding Debrided pre-ulcerative callus left 1 without any bleeding  Indications for diabetic shoes Type II diabetic with neuropathy Hallux valgus bilaterally Loss of vibratory sensation Loss of protective sensation Pre-ulcerative callus left hallux   Reappoint 3 months for skin a nail debridement notify patient upon certification by [name] for diabetic shoes

## 2016-05-24 ENCOUNTER — Encounter: Payer: Self-pay | Admitting: Family Medicine

## 2016-05-25 ENCOUNTER — Other Ambulatory Visit: Payer: Self-pay | Admitting: Family Medicine

## 2016-05-25 MED ORDER — ALBUTEROL SULFATE HFA 108 (90 BASE) MCG/ACT IN AERS: INHALATION_SPRAY | RESPIRATORY_TRACT | Status: DC

## 2016-06-09 ENCOUNTER — Other Ambulatory Visit: Payer: Self-pay | Admitting: Family Medicine

## 2016-06-12 ENCOUNTER — Other Ambulatory Visit: Payer: Self-pay | Admitting: Family Medicine

## 2016-07-02 ENCOUNTER — Other Ambulatory Visit: Payer: Self-pay | Admitting: Family Medicine

## 2016-07-02 DIAGNOSIS — L570 Actinic keratosis: Secondary | ICD-10-CM | POA: Diagnosis not present

## 2016-07-02 DIAGNOSIS — L409 Psoriasis, unspecified: Secondary | ICD-10-CM | POA: Diagnosis not present

## 2016-07-02 DIAGNOSIS — L821 Other seborrheic keratosis: Secondary | ICD-10-CM | POA: Diagnosis not present

## 2016-07-02 MED ORDER — TRAMADOL HCL 50 MG PO TABS: 50.0000 mg | ORAL_TABLET | Freq: Three times a day (TID) | ORAL | 0 refills | Status: DC | PRN

## 2016-07-23 ENCOUNTER — Other Ambulatory Visit: Payer: Self-pay

## 2016-07-23 ENCOUNTER — Telehealth: Payer: Self-pay

## 2016-07-23 MED ORDER — HYDROCHLOROTHIAZIDE 12.5 MG PO CAPS: 12.5000 mg | ORAL_CAPSULE | Freq: Every day | ORAL | 1 refills | Status: DC

## 2016-07-23 MED ORDER — ATORVASTATIN CALCIUM 80 MG PO TABS: ORAL_TABLET | ORAL | 1 refills | Status: DC

## 2016-07-23 MED ORDER — CLONIDINE HCL 0.2 MG PO TABS: 0.1000 mg | ORAL_TABLET | Freq: Two times a day (BID) | ORAL | 1 refills | Status: DC

## 2016-07-23 MED ORDER — GABAPENTIN 300 MG PO CAPS: 600.0000 mg | ORAL_CAPSULE | Freq: Three times a day (TID) | ORAL | 0 refills | Status: DC

## 2016-07-23 MED ORDER — AMLODIPINE BESYLATE 5 MG PO TABS: 5.0000 mg | ORAL_TABLET | Freq: Every day | ORAL | 1 refills | Status: DC

## 2016-07-23 MED ORDER — CARVEDILOL 25 MG PO TABS: ORAL_TABLET | ORAL | 1 refills | Status: DC

## 2016-07-23 NOTE — Telephone Encounter (Signed)
Called the patient informed that all prescriptions have gone to [facility].

## 2016-07-23 NOTE — Telephone Encounter (Signed)
Patient's wife called stating refills should go to [facility]  carvedilol (COREG) 25 MG tablet gabapentin (NEURONTIN) 300 MG capsule amLODipine (NORVASC) 5 MG tablet hydrochlorothiazide (MICROZIDE) 12.5 MG capsule atorvastatin (LIPITOR) 80 MG tablet cloNIDine (CATAPRES) 0.2 MG tablet  Patient did not need lisinopril per wife.

## 2016-07-30 ENCOUNTER — Ambulatory Visit (INDEPENDENT_AMBULATORY_CARE_PROVIDER_SITE_OTHER): Payer: Medicare Other | Admitting: Family Medicine

## 2016-07-30 VITALS — BP 118/68 | HR 83 | Temp 97.6°F | Resp 16 | Ht 69.0 in | Wt 233.0 lb

## 2016-07-30 DIAGNOSIS — R058 Other specified cough: Secondary | ICD-10-CM

## 2016-07-30 DIAGNOSIS — R05 Cough: Secondary | ICD-10-CM | POA: Diagnosis not present

## 2016-07-30 MED ORDER — HYDROCODONE-HOMATROPINE 5-1.5 MG/5ML PO SYRP: 5.0000 mL | ORAL_SOLUTION | ORAL | 0 refills | Status: DC | PRN

## 2016-07-30 MED ORDER — BENZONATATE 100 MG PO CAPS: 100.0000 mg | ORAL_CAPSULE | Freq: Three times a day (TID) | ORAL | 0 refills | Status: DC | PRN

## 2016-07-30 MED ORDER — AMOXICILLIN 875 MG PO TABS: 875.0000 mg | ORAL_TABLET | Freq: Two times a day (BID) | ORAL | 0 refills | Status: DC

## 2016-07-30 NOTE — Progress Notes (Signed)
Patient ID: [name], male    DOB: 02-Aug-1946  Age: 70 y.o. MRN: [id number]  Chief Complaint  Patient presents with  . Cough    X 5 days  . CHEST CONGESTION    Subjective:   70 year old man who had a respiratory tract infection that started with a cold and congestion then settled into his chest about 5 days ago. He has been coughing up a lot of mucus. Especially lays down. It looked more purulent earlier than it does now, though still is purulent in appearance. He was febrile early on, not now. His wife insisted he come on in to Dr. day.    Current allergies, medications, problem list, past/family and social histories reviewed.  Objective:  BP 118/68 (BP Location: Left Arm, Patient Position: Sitting, Cuff Size: Large)   Pulse 83   Temp 97.6 F (36.4 C) (Oral)   Resp 16   Ht 5\' 9"  (1.753 m)   Wt 233 lb (105.7 kg)   SpO2 93%   BMI 34.41 kg/m   No major acute distress. He is coughing. His TMs are normal. Throat clear. Neck supple without significant nodes. Chest is clear to auscultation without any rhonchi, rales, wheezes  Assessment & Plan:   Assessment: 1. Post-viral cough syndrome       Plan: This is a postviral bronchitis. Is been going on long enough that this time we will go ahead and give some stuff for it.  No orders of the defined types were placed in this encounter.   Meds ordered this encounter  Medications  . amoxicillin (AMOXIL) 875 MG tablet    Sig: Take 1 tablet (875 mg total) by mouth 2 (two) times daily.    Dispense:  14 tablet    Refill:  0  . benzonatate (TESSALON) 100 MG capsule    Sig: Take 1-2 capsules (100-200 mg total) by mouth 3 (three) times daily as needed.    Dispense:  30 capsule    Refill:  0  . HYDROcodone-homatropine (HYCODAN) 5-1.5 MG/5ML syrup    Sig: Take 5 mLs by mouth every 4 (four) hours as needed.    Dispense:  120 mL    Refill:  0         Patient Instructions   Drink plenty of fluids  Take the amoxicillin  875 mg one twice daily for infection  Tessalon 100 mg 1 or 2 pills 3 times daily as needed for daytime cough  Hycodan cough syrup 1 teaspoon every 4-6 hours as needed for nighttime cough. This will tend to make you drowsy so you probably don't want to take it if you're working.  Return if worse at any time    IF you received an x-ray today, you will receive an invoice from [facility]. Please contact [facility] at [phone] with questions or concerns regarding your invoice.   IF you received labwork today, you will receive an invoice from [facility]. Please contact [facility] at [phone] with questions or concerns regarding your invoice.   Our billing staff will not be able to assist you with questions regarding bills from these companies.  You will be contacted with the lab results as soon as they are available. The fastest way to get your results is to activate your My Chart account. Instructions are located on the last page of this paperwork. If you have not heard from [name] regarding the results in 2 weeks, please contact this office.  Return if symptoms worsen or fail to improve.   HOPPER,DAVID, MD 07/30/2016

## 2016-07-30 NOTE — Patient Instructions (Addendum)
Drink plenty of fluids  Take the amoxicillin 875 mg one twice daily for infection  Tessalon 100 mg 1 or 2 pills 3 times daily as needed for daytime cough  Hycodan cough syrup 1 teaspoon every 4-6 hours as needed for nighttime cough. This will tend to make you drowsy so you probably don't want to take it if you're working.  Return if worse at any time    IF you received an x-ray today, you will receive an invoice from [facility]. Please contact [facility] at [phone] with questions or concerns regarding your invoice.   IF you received labwork today, you will receive an invoice from [facility]. Please contact [facility] at [phone] with questions or concerns regarding your invoice.   Our billing staff will not be able to assist you with questions regarding bills from these companies.  You will be contacted with the lab results as soon as they are available. The fastest way to get your results is to activate your My Chart account. Instructions are located on the last page of this paperwork. If you have not heard from [name] regarding the results in 2 weeks, please contact this office.

## 2016-08-06 ENCOUNTER — Encounter: Payer: Self-pay | Admitting: Family Medicine

## 2016-08-06 ENCOUNTER — Ambulatory Visit (INDEPENDENT_AMBULATORY_CARE_PROVIDER_SITE_OTHER): Payer: Medicare Other | Admitting: Family Medicine

## 2016-08-06 VITALS — BP 120/68 | HR 73 | Temp 98.1°F | Wt 235.6 lb

## 2016-08-06 DIAGNOSIS — D649 Anemia, unspecified: Secondary | ICD-10-CM

## 2016-08-06 DIAGNOSIS — E785 Hyperlipidemia, unspecified: Secondary | ICD-10-CM

## 2016-08-06 DIAGNOSIS — N289 Disorder of kidney and ureter, unspecified: Secondary | ICD-10-CM

## 2016-08-06 DIAGNOSIS — E1149 Type 2 diabetes mellitus with other diabetic neurological complication: Secondary | ICD-10-CM | POA: Diagnosis not present

## 2016-08-06 DIAGNOSIS — Z23 Encounter for immunization: Secondary | ICD-10-CM

## 2016-08-06 DIAGNOSIS — I10 Essential (primary) hypertension: Secondary | ICD-10-CM | POA: Diagnosis not present

## 2016-08-06 NOTE — Assessment & Plan Note (Signed)
hgba1c acceptable, minimize simple carbs. Increase exercise as tolerated. Continue current meds 

## 2016-08-06 NOTE — Patient Instructions (Signed)
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
## 2016-08-06 NOTE — Assessment & Plan Note (Signed)
Well controlled, no changes to meds. Encouraged heart healthy diet such as the DASH diet and exercise as tolerated.  °

## 2016-08-06 NOTE — Progress Notes (Signed)
Pre visit review using our clinic review tool, if applicable. No additional management support is needed unless otherwise documented below in the visit note. 

## 2016-08-06 NOTE — Assessment & Plan Note (Signed)
Encouraged heart healthy diet, increase exercise, avoid trans fats, consider a krill oil cap daily. Tolerating Atorvastatin 

## 2016-08-09 ENCOUNTER — Other Ambulatory Visit (INDEPENDENT_AMBULATORY_CARE_PROVIDER_SITE_OTHER): Payer: Medicare Other

## 2016-08-09 ENCOUNTER — Telehealth: Payer: Self-pay | Admitting: Podiatry

## 2016-08-09 DIAGNOSIS — N289 Disorder of kidney and ureter, unspecified: Secondary | ICD-10-CM | POA: Diagnosis not present

## 2016-08-09 DIAGNOSIS — E785 Hyperlipidemia, unspecified: Secondary | ICD-10-CM | POA: Diagnosis not present

## 2016-08-09 DIAGNOSIS — E1149 Type 2 diabetes mellitus with other diabetic neurological complication: Secondary | ICD-10-CM | POA: Diagnosis not present

## 2016-08-09 DIAGNOSIS — I10 Essential (primary) hypertension: Secondary | ICD-10-CM

## 2016-08-09 LAB — COMPREHENSIVE METABOLIC PANEL
ALT: 17 U/L (ref 0–53)
AST: 15 U/L (ref 0–37)
Albumin: 3.8 g/dL (ref 3.5–5.2)
Alkaline Phosphatase: 74 U/L (ref 39–117)
BUN: 18 mg/dL (ref 6–23)
CO2: 32 mEq/L (ref 19–32)
Calcium: 9.2 mg/dL (ref 8.4–10.5)
Chloride: 100 mEq/L (ref 96–112)
Creatinine, Ser: 0.78 mg/dL (ref 0.40–1.50)
GFR: 104.9 mL/min (ref >60.00)
Glucose, Bld: 167 mg/dL — ABNORMAL HIGH (ref 70–99)
Potassium: 4.1 mEq/L (ref 3.5–5.1)
Sodium: 141 mEq/L (ref 135–145)
Total Bilirubin: 0.3 mg/dL (ref 0.2–1.2)
Total Protein: 7 g/dL (ref 6.0–8.3)

## 2016-08-09 LAB — TSH: TSH: 2.08 u[IU]/mL (ref 0.35–4.50)

## 2016-08-09 LAB — CBC
HCT: 41.6 % (ref 39.0–52.0)
Hemoglobin: 13.9 g/dL (ref 13.0–17.0)
MCHC: 33.4 g/dL (ref 30.0–36.0)
MCV: 94.7 fl (ref 78.0–100.0)
Platelets: 269 10*3/uL (ref 150.0–400.0)
RBC: 4.39 Mil/uL (ref 4.22–5.81)
RDW: 14.4 % (ref 11.5–15.5)
WBC: 7.8 10*3/uL (ref 4.0–10.5)

## 2016-08-09 LAB — LIPID PANEL
Cholesterol: 130 mg/dL (ref 0–200)
HDL: 30.1 mg/dL — ABNORMAL LOW (ref >39.00)
LDL Cholesterol: 64 mg/dL (ref 0–99)
NonHDL: 99.58
Total CHOL/HDL Ratio: 4
Triglycerides: 180 mg/dL — ABNORMAL HIGH (ref 0.0–149.0)
VLDL: 36 mg/dL (ref 0.0–40.0)

## 2016-08-09 LAB — HEMOGLOBIN A1C: Hgb A1c MFr Bld: 7 % — ABNORMAL HIGH (ref 4.6–6.5)

## 2016-08-09 NOTE — Telephone Encounter (Signed)
Left message for patient to schedule an appointment with Melody to be measured for Diabetic shoes. °

## 2016-08-21 DIAGNOSIS — Z1212 Encounter for screening for malignant neoplasm of rectum: Secondary | ICD-10-CM | POA: Diagnosis not present

## 2016-08-21 DIAGNOSIS — Z1211 Encounter for screening for malignant neoplasm of colon: Secondary | ICD-10-CM | POA: Diagnosis not present

## 2016-08-21 LAB — COLOGUARD: Cologuard: NEGATIVE

## 2016-08-22 ENCOUNTER — Encounter: Payer: Self-pay | Admitting: Podiatry

## 2016-08-22 ENCOUNTER — Ambulatory Visit (INDEPENDENT_AMBULATORY_CARE_PROVIDER_SITE_OTHER): Payer: Medicare Other | Admitting: Podiatry

## 2016-08-22 VITALS — BP 155/87 | HR 74

## 2016-08-22 DIAGNOSIS — M79676 Pain in unspecified toe(s): Secondary | ICD-10-CM | POA: Diagnosis not present

## 2016-08-22 DIAGNOSIS — B351 Tinea unguium: Secondary | ICD-10-CM | POA: Diagnosis not present

## 2016-08-22 DIAGNOSIS — E1149 Type 2 diabetes mellitus with other diabetic neurological complication: Secondary | ICD-10-CM

## 2016-08-22 DIAGNOSIS — L84 Corns and callosities: Secondary | ICD-10-CM

## 2016-08-22 NOTE — Progress Notes (Signed)
Patient ID: [name], male   DOB: 1946/10/16, 70 y.o.   MRN: [id number]   Subjective: Patient presents today complaining of elongated and thickened toenails which are uncomfortable when walking wearing shoes and requests nail debridement. Also has concern about a callus on the left hallux. Today patient is also requesting a replacement diabetic shoes  Objective: DP pulses 2/4 right 0/4 left PT pulse right 2/4 and 1/4 left Capillary reflex immediate bilaterally Sensation to 10 g monofilament wire intact 4/5 right and 2/5 left Vibratory sensation nonreactive bilaterally Ankle reflex equal and reactive bilaterally HAV bilaterally No open skin lesions bilaterally Large well-organized callus medial left hallux with small amount of bleeding within the callus that remains closed after debridement The toenails are elongated, brittle, deformed, discolored and tender direct palpation 6-10  Assessment: Diabetic with peripheral neuropathy Symptomatic onychomycoses 6-10 Pre-ulcerative callus left hallux  Plan: Debridement toenails 6-10 mechanical and electrical without a bleeding Debrided pre-ulcerative callus left 1 without any bleeding  Indications for diabetic shoes Type II diabetic with neuropathy Hallux valgus bilaterally Loss of vibratory sensation Loss of protective sensation Pre-ulcerative callus left hallux Shoe measurement for diabetic shoes and custom insoles performed today. Notify patient upon receipt of diabetic shoes with custom insoles for dispensing  Reappoint 3 months for skin a nail debridement

## 2016-08-22 NOTE — Patient Instructions (Signed)
Diabetes and Foot Care Diabetes may cause you to have problems because of poor blood supply (circulation) to your feet and legs. This may cause the skin on your feet to become thinner, break easier, and heal more slowly. Your skin may become dry, and the skin may peel and crack. You may also have nerve damage in your legs and feet causing decreased feeling in them. You may not notice minor injuries to your feet that could lead to infections or more serious problems. Taking care of your feet is one of the most important things you can do for yourself.  HOME CARE INSTRUCTIONS  Wear shoes at all times, even in the house. Do not go barefoot. Bare feet are easily injured.  Check your feet daily for blisters, cuts, and redness. If you cannot see the bottom of your feet, use a mirror or ask someone for help.  Wash your feet with warm water (do not use hot water) and mild soap. Then pat your feet and the areas between your toes until they are completely dry. Do not soak your feet as this can dry your skin.  Apply a moisturizing lotion or petroleum jelly (that does not contain alcohol and is unscented) to the skin on your feet and to dry, brittle toenails. Do not apply lotion between your toes.  Trim your toenails straight across. Do not dig under them or around the cuticle. File the edges of your nails with an emery board or nail file.  Do not cut corns or calluses or try to remove them with medicine.  Wear clean socks or stockings every day. Make sure they are not too tight. Do not wear knee-high stockings since they may decrease blood flow to your legs.  Wear shoes that fit properly and have enough cushioning. To break in new shoes, wear them for just a few hours a day. This prevents you from injuring your feet. Always look in your shoes before you put them on to be sure there are no objects inside.  Do not cross your legs. This may decrease the blood flow to your feet.  If you find a minor scrape,  cut, or break in the skin on your feet, keep it and the skin around it clean and dry. These areas may be cleansed with mild soap and water. Do not cleanse the area with peroxide, alcohol, or iodine.  When you remove an adhesive bandage, be sure not to damage the skin around it.  If you have a wound, look at it several times a day to make sure it is healing.  Do not use heating pads or hot water bottles. They may burn your skin. If you have lost feeling in your feet or legs, you may not know it is happening until it is too late.  Make sure your health care provider performs a complete foot exam at least annually or more often if you have foot problems. Report any cuts, sores, or bruises to your health care provider immediately. SEEK MEDICAL CARE IF:   You have an injury that is not healing.  You have cuts or breaks in the skin.  You have an ingrown nail.  You notice redness on your legs or feet.  You feel burning or tingling in your legs or feet.  You have pain or cramps in your legs and feet.  Your legs or feet are numb.  Your feet always feel cold. SEEK IMMEDIATE MEDICAL CARE IF:   There is increasing redness,   swelling, or pain in or around a wound.  There is a red line that goes up your leg.  Pus is coming from a wound.  You develop a fever or as directed by your health care provider.  You notice a bad smell coming from an ulcer or wound.   This information is not intended to replace advice given to you by your health care provider. Make sure you discuss any questions you have with your health care provider.   Document Released: 10/19/2000 Document Revised: 06/24/2013 Document Reviewed: 03/31/2013 Elsevier Interactive Patient Education 2016 [employer]  

## 2016-08-31 ENCOUNTER — Other Ambulatory Visit: Payer: Self-pay | Admitting: Family Medicine

## 2016-08-31 ENCOUNTER — Encounter: Payer: Self-pay | Admitting: Family Medicine

## 2016-08-31 DIAGNOSIS — R05 Cough: Secondary | ICD-10-CM

## 2016-08-31 DIAGNOSIS — R058 Other specified cough: Secondary | ICD-10-CM

## 2016-08-31 NOTE — Telephone Encounter (Signed)
Last refill for tramadol 07/02/2016  #120 with 0 refills Last refill for hycodan 07/30/2016  166ml Last office visit 08/06/2016 No UDS/No Contract

## 2016-09-02 MED ORDER — TRAMADOL HCL 50 MG PO TABS: 50.0000 mg | ORAL_TABLET | Freq: Three times a day (TID) | ORAL | 0 refills | Status: DC | PRN

## 2016-09-02 MED ORDER — HYDROCODONE-HOMATROPINE 5-1.5 MG/5ML PO SYRP: 5.0000 mL | ORAL_SOLUTION | ORAL | 0 refills | Status: DC | PRN

## 2016-09-03 NOTE — Telephone Encounter (Signed)
Explained to PCP instructions. They will just discuss at his next appt. In Jan. She did state this was discussed at his last appt./[name] his neurosurgeon was prescribing the hydrocodone and is not now and he told the patient he could get from his PCP. The wife stated it was discussed briefly at his last appt..   But for now they will wait.

## 2016-09-03 NOTE — Telephone Encounter (Signed)
He was not wanting the hycodan refill (pharmacy mistake) BUT the hydocodone APAP 10/325???

## 2016-09-03 NOTE — Telephone Encounter (Signed)
I have not prescribed the norco, I cannot take over a narcotic prescription over the phone and usually we use tramadol or hydrocodone not both. Would need an appt to document need and maybe a referral to pain management. New laws are coming in next 1-2 months

## 2016-09-08 ENCOUNTER — Other Ambulatory Visit: Payer: Self-pay | Admitting: Family Medicine

## 2016-09-14 ENCOUNTER — Other Ambulatory Visit: Payer: Self-pay | Admitting: Family Medicine

## 2016-09-18 ENCOUNTER — Ambulatory Visit (INDEPENDENT_AMBULATORY_CARE_PROVIDER_SITE_OTHER): Payer: Medicare Other | Admitting: Podiatry

## 2016-09-18 DIAGNOSIS — L84 Corns and callosities: Secondary | ICD-10-CM | POA: Diagnosis not present

## 2016-09-18 DIAGNOSIS — E1149 Type 2 diabetes mellitus with other diabetic neurological complication: Secondary | ICD-10-CM | POA: Diagnosis not present

## 2016-09-18 DIAGNOSIS — M2012 Hallux valgus (acquired), left foot: Secondary | ICD-10-CM

## 2016-09-18 DIAGNOSIS — M2011 Hallux valgus (acquired), right foot: Secondary | ICD-10-CM | POA: Diagnosis not present

## 2016-09-18 NOTE — Progress Notes (Signed)
Patient ID: [name], male   DOB: [birth year], 70 y.o.   MRN: [id number] Subjective: This patient presents for dispensing of diabetic shoes 2 and custom heat molded diabetic insoles 6  Indications for diabetic shoes Type II diabetic with neuropathy Hallux valgus bilaterally Loss of vibratory sensation Loss of protective sensation Pre-ulcerative callus left hallux  Assessment: Satisfactory fit of diabetic shoes 2 and custom insoles 6  Plan: Dispensed Apex Sierra prior run or shoes size 9.5 wide and 6 custom insoles. Wearing instruction provided  Reappoint for scheduled visit skin a nail debridement

## 2016-09-18 NOTE — Progress Notes (Signed)
Patient presents for diabetic shoe pick up, shoes are tried on for good fit.  Patient received 1 Pair and 3 pairs custom molded diabetic inserts. Patient received 1 pair of Apex Sierra Trail Runner, size 9.5 wide.  Verbal and written break in and wear instructions given.  Patient will follow up for scheduled routine care.

## 2016-09-18 NOTE — Patient Instructions (Signed)

## 2016-09-19 ENCOUNTER — Other Ambulatory Visit: Payer: Self-pay | Admitting: Family Medicine

## 2016-09-19 DIAGNOSIS — I714 Abdominal aortic aneurysm, without rupture, unspecified: Secondary | ICD-10-CM

## 2016-10-01 ENCOUNTER — Ambulatory Visit (HOSPITAL_COMMUNITY)
Admission: RE | Admit: 2016-10-01 | Discharge: 2016-10-01 | Disposition: A | Discharge status: Home / Self Care | Payer: Medicare Other | Source: Ambulatory Visit | Attending: Cardiovascular Disease | Admitting: Cardiovascular Disease

## 2016-10-01 DIAGNOSIS — I714 Abdominal aortic aneurysm, without rupture, unspecified: Secondary | ICD-10-CM

## 2016-10-04 DIAGNOSIS — B353 Tinea pedis: Secondary | ICD-10-CM | POA: Diagnosis not present

## 2016-10-04 DIAGNOSIS — L409 Psoriasis, unspecified: Secondary | ICD-10-CM | POA: Diagnosis not present

## 2016-10-31 ENCOUNTER — Ambulatory Visit (INDEPENDENT_AMBULATORY_CARE_PROVIDER_SITE_OTHER): Payer: Medicare Other | Admitting: Physician Assistant

## 2016-10-31 VITALS — BP 122/64 | HR 95 | Temp 98.2°F | Resp 18 | Ht 69.0 in | Wt 231.0 lb

## 2016-10-31 DIAGNOSIS — J329 Chronic sinusitis, unspecified: Secondary | ICD-10-CM

## 2016-10-31 DIAGNOSIS — J40 Bronchitis, not specified as acute or chronic: Secondary | ICD-10-CM

## 2016-10-31 MED ORDER — BENZONATATE 100 MG PO CAPS: 100.0000 mg | ORAL_CAPSULE | Freq: Three times a day (TID) | ORAL | 0 refills | Status: DC | PRN

## 2016-10-31 MED ORDER — DOXYCYCLINE HYCLATE 100 MG PO CAPS: 100.0000 mg | ORAL_CAPSULE | Freq: Two times a day (BID) | ORAL | 0 refills | Status: AC

## 2016-10-31 MED ORDER — HYDROCODONE-HOMATROPINE 5-1.5 MG/5ML PO SYRP: 5.0000 mL | ORAL_SOLUTION | Freq: Two times a day (BID) | ORAL | 0 refills | Status: DC | PRN

## 2016-10-31 MED ORDER — GUAIFENESIN ER 1200 MG PO TB12: 1.0000 | ORAL_TABLET | Freq: Two times a day (BID) | ORAL | 1 refills | Status: DC | PRN

## 2016-10-31 NOTE — Patient Instructions (Addendum)
Get your tetanus and the Pneumovax 23 when you are well.  Get plenty of rest and drink at least 64 ounces of water daily.     IF you received an x-ray today, you will receive an invoice from [facility]. Please contact [facility] at [phone] with questions or concerns regarding your invoice.   IF you received labwork today, you will receive an invoice from [facility]. Please contact [facility] at [phone] with questions or concerns regarding your invoice.   Our billing staff will not be able to assist you with questions regarding bills from these companies.  You will be contacted with the lab results as soon as they are available. The fastest way to get your results is to activate your My Chart account. Instructions are located on the last page of this paperwork. If you have not heard from [name] regarding the results in 2 weeks, please contact this office.

## 2016-10-31 NOTE — Progress Notes (Signed)
Patient ID: [name], male    DOB: [birth year], 70 y.o.   MRN: [id number]  PCP: Penni Homans, MD  Chief Complaint  Patient presents with  . Cough    about a week and a half  . Nasal Congestion  . Back Pain    Subjective:   Presents for evaluation of cough and congestion x 2 weeks.  Cough is keeping him awake at night. Sore throat. Cough causes back pain. New low back pain began yesterday, Low RIGHT. Nasal and sinus congestion. Gets easily SOB. No CP. Subjective fever/chills about 3-4 days ago. Stayed in bed that day. No other muscle pain.  Delsym did not help at all.     Review of Systems As above    Patient Active Problem List   Diagnosis Date Noted  . Allergic rhinitis 04/23/2016  . Diabetic peripheral vascular disease ([place]) 09/06/2015  . Allergic state 04/03/2015  . COPD (chronic obstructive pulmonary disease) ([place]) 02/17/2015  . Hereditary and idiopathic peripheral neuropathy 10/17/2014  . Aneurysm, cerebral, nonruptured 03/02/2014  . Renal insufficiency 08/14/2013  . Anemia 08/14/2013  . Lumbar herniated disc 11/10/2012  . AAA (abdominal aortic aneurysm) without rupture ([place]) 04/27/2010  . GERD 11/16/2009  . Hyperlipidemia 10/17/2007  . Essential hypertension 10/17/2007  . Coronary atherosclerosis 10/17/2007  . History of CVA (cerebrovascular accident) without residual deficits 10/17/2007  . RENAL CALCULUS, HX OF 10/17/2007  . GLAUCOMA, HX OF 10/17/2007  . Diabetes mellitus type 2 with neurological manifestations ([place]) 04/21/2007     Prior to Admission medications   Medication Sig Start Date End Date Taking? Authorizing Provider  amLODipine (NORVASC) 5 MG tablet Take 1 tablet (5 mg total) by mouth daily. 07/23/16  Yes [name], MD  Ascorbic Acid (VITAMIN C) 1000 MG tablet Take 1,000 mg by mouth daily.   Yes Historical Provider, MD  aspirin 81 MG tablet Take 81 mg by mouth daily.    Yes Historical Provider, MD  atorvastatin (LIPITOR) 80  MG tablet Take 1 tablet by mouth  every day at 6pm 07/23/16  Yes [name], MD  carvedilol (COREG) 25 MG tablet TAKE 1/2 TABLET BY MOUTH  TWICE A DAY WITH A MEAL 07/23/16  Yes [name], MD  cholecalciferol (VITAMIN D) 1000 UNITS tablet Take 1,000 Units by mouth daily.   Yes Historical Provider, MD  cloNIDine (CATAPRES) 0.2 MG tablet Take 0.5 tablets (0.1 mg total) by mouth 2 (two) times daily. 07/23/16  Yes [name], MD  gabapentin (NEURONTIN) 300 MG capsule TAKE 2 CAPSULES BY MOUTH 3  TIMES DAILY 09/16/16  Yes [name], MD  glimepiride (AMARYL) 2 MG tablet TAKE 1 TABLET BY MOUTH 3  TIMES DAILY 09/14/16  Yes [name], MD  glucose blood (FREESTYLE LITE) test strip Use 1 strip as instructed    Yes Historical Provider, MD  hydrochlorothiazide (MICROZIDE) 12.5 MG capsule Take 1 capsule (12.5 mg total) by mouth daily. 07/23/16  Yes [name], MD  Lancets (FREESTYLE) lancets Use as instructed once a day.    Yes Historical Provider, MD  losartan (COZAAR) 50 MG tablet Take 1 tablet by mouth  daily 06/11/16  Yes [name], MD  metFORMIN (GLUCOPHAGE) 1000 MG tablet 1 tablet every morning, 1/2 tablet at lunch, and one tablet every evening. 09/27/15  Yes Ria Bush, MD  Multiple Vitamin (MULTIVITAMIN) tablet Take 1 tablet by mouth daily.     Yes Historical Provider, MD  [facility] HFA  108 (90 Base) MCG/ACT inhaler INHALE 2 PUFFS INTO THE LUNGS EVERY 6 HOURS AS NEEDED FOR WHEEZING OR SHORTNESS OF BREATH 06/12/16  Yes [name], MD  traMADol (ULTRAM) 50 MG tablet Take 1-2 tablets (50-100 mg total) by mouth 3 (three) times daily as needed for moderate pain or severe pain. 09/02/16  Yes [name], MD  vitamin B-12 (CYANOCOBALAMIN) 1000 MCG tablet Take 1,000 mcg by mouth 2 (two) times a week.   Yes Historical Provider, MD     No Known Allergies     Objective:  Physical Exam  Constitutional: He is oriented to person, place, and time. He appears well-developed and  well-nourished. No distress.  BP 122/64 (BP Location: Right Arm, Patient Position: Sitting, Cuff Size: Large)   Pulse 95   Temp 98.2 F (36.8 C)   Resp 18   Ht 5\' 9"  (1.753 m)   Wt 231 lb (104.8 kg)   SpO2 92% Comment: repeat at 1435 - hightest 94%, then decreased back to 92%  BMI 34.11 kg/m    HENT:  Head: Normocephalic and atraumatic.  Right Ear: Hearing, tympanic membrane, external ear and ear canal normal.  Left Ear: Hearing, tympanic membrane, external ear and ear canal normal.  Nose: Mucosal edema (mild) present.  No foreign bodies. Right sinus exhibits no maxillary sinus tenderness and no frontal sinus tenderness. Left sinus exhibits no maxillary sinus tenderness and no frontal sinus tenderness.  Mouth/Throat: Uvula is midline, oropharynx is clear and moist and mucous membranes are normal. He has dentures (fully compensated complete edentula). No uvula swelling. No oropharyngeal exudate.  Eyes: Conjunctivae and EOM are normal. Pupils are equal, round, and reactive to light. Right eye exhibits no discharge. Left eye exhibits no discharge. No scleral icterus.  Neck: Trachea normal, normal range of motion and full passive range of motion without pain. Neck supple. No thyroid mass and no thyromegaly present.  Cardiovascular: Normal rate, regular rhythm and normal heart sounds.   Pulmonary/Chest: Effort normal and breath sounds normal.  Lymphadenopathy:       Head (right side): No submandibular, no tonsillar, no preauricular, no posterior auricular and no occipital adenopathy present.       Head (left side): No submandibular, no tonsillar, no preauricular and no occipital adenopathy present.    He has no cervical adenopathy.       Right: No supraclavicular adenopathy present.       Left: No supraclavicular adenopathy present.  Neurological: He is alert and oriented to person, place, and time. He has normal strength. No cranial nerve deficit or sensory deficit.  Skin: Skin is warm, dry  and intact. No rash noted.  Psychiatric: He has a normal mood and affect. His speech is normal and behavior is normal.           Assessment & Plan:   1. Sinobronchitis Supportive care.  Anticipatory guidance.  RTC if symptoms worsen/persist. - benzonatate (TESSALON) 100 MG capsule; Take 1-2 capsules (100-200 mg total) by mouth 3 (three) times daily as needed for cough.  Dispense: 40 capsule; Refill: 0 - HYDROcodone-homatropine (HYCODAN) 5-1.5 MG/5ML syrup; Take 5 mLs by mouth every 12 (twelve) hours as needed for cough.  Dispense: 100 mL; Refill: 0 - Guaifenesin (MUCINEX MAXIMUM STRENGTH) 1200 MG TB12; Take 1 tablet (1,200 mg total) by mouth every 12 (twelve) hours as needed.  Dispense: 14 tablet; Refill: 1 - doxycycline (VIBRAMYCIN) 100 MG capsule; Take 1 capsule (100 mg total) by mouth 2 (two) times daily.  Dispense: 20 capsule; Refill: 0   Fara Chute, PA-C Physician Assistant-Certified [facility] [place]

## 2016-11-01 ENCOUNTER — Encounter: Payer: Self-pay | Admitting: Family Medicine

## 2016-11-02 ENCOUNTER — Other Ambulatory Visit: Payer: Self-pay | Admitting: Family Medicine

## 2016-11-02 MED ORDER — HYDROCHLOROTHIAZIDE 12.5 MG PO CAPS: 12.5000 mg | ORAL_CAPSULE | Freq: Every day | ORAL | 1 refills | Status: DC

## 2016-11-02 MED ORDER — GABAPENTIN 300 MG PO CAPS: ORAL_CAPSULE | ORAL | 1 refills | Status: DC

## 2016-11-02 MED ORDER — CLONIDINE HCL 0.2 MG PO TABS
0.1000 mg | ORAL_TABLET | Freq: Two times a day (BID) | ORAL | 1 refills | Status: DC
Start: 2016-11-02 — End: 2017-08-11

## 2016-11-07 ENCOUNTER — Encounter: Payer: Self-pay | Admitting: Family Medicine

## 2016-11-08 ENCOUNTER — Other Ambulatory Visit: Payer: Self-pay | Admitting: Family Medicine

## 2016-11-08 MED ORDER — TRAMADOL HCL 50 MG PO TABS: 50.0000 mg | ORAL_TABLET | Freq: Three times a day (TID) | ORAL | 0 refills | Status: DC | PRN

## 2016-11-08 NOTE — Telephone Encounter (Signed)
Will fax tramadol to [facility] as requested.

## 2016-11-19 ENCOUNTER — Ambulatory Visit: Payer: Medicare Other | Admitting: Family Medicine

## 2016-11-20 ENCOUNTER — Ambulatory Visit: Payer: Medicare Other | Admitting: Family Medicine

## 2016-11-21 ENCOUNTER — Ambulatory Visit: Payer: Medicare Other | Admitting: Podiatry

## 2016-12-05 ENCOUNTER — Encounter: Payer: Self-pay | Admitting: Podiatry

## 2016-12-05 ENCOUNTER — Ambulatory Visit (INDEPENDENT_AMBULATORY_CARE_PROVIDER_SITE_OTHER): Payer: Medicare Other | Admitting: Podiatry

## 2016-12-05 VITALS — BP 150/75 | HR 84 | Resp 18

## 2016-12-05 DIAGNOSIS — G63 Polyneuropathy in diseases classified elsewhere: Secondary | ICD-10-CM

## 2016-12-05 DIAGNOSIS — M79676 Pain in unspecified toe(s): Secondary | ICD-10-CM

## 2016-12-05 DIAGNOSIS — B351 Tinea unguium: Secondary | ICD-10-CM | POA: Diagnosis not present

## 2016-12-05 DIAGNOSIS — L84 Corns and callosities: Secondary | ICD-10-CM

## 2016-12-05 DIAGNOSIS — E1149 Type 2 diabetes mellitus with other diabetic neurological complication: Secondary | ICD-10-CM

## 2016-12-05 NOTE — Progress Notes (Signed)
Patient ID: [name], male   DOB: 23-Jul-1946, 71 y.o.   MRN: [id number]    Subjective: Patient presents today complaining of elongated and thickened toenails which are uncomfortable when walking wearing shoes and requests nail debridement. Also has concern about a callus on the left hallux. Today patient is also requesting a replacement diabetic shoes  Objective: DP pulses 2/4 right 0/4 left PT pulse right 2/4 and 1/4 left Capillary reflex immediate bilaterally Sensation to 10 g monofilament wire intact 4/5 right and 2/5 left Vibratory sensation nonreactive bilaterally Ankle reflex equal and reactive bilaterally HAV bilaterally No open skin lesions bilaterally Large well-organized callus medial left hallux with small amount of bleeding within the callus that remains closed after debridement The toenails are elongated, brittle, deformed, discolored and tender direct palpation 6-10  Assessment: Diabetic with peripheral neuropathy Symptomatic onychomycoses 6-10 Pre-ulcerative callus left hallux  Plan: Debridement toenails 6-10 mechanical and electrical without a bleeding Debrided pre-ulcerative callus left 1 without any bleeding  Reappoint 3 months

## 2016-12-05 NOTE — Patient Instructions (Signed)

## 2016-12-06 ENCOUNTER — Encounter: Payer: Self-pay | Admitting: Family Medicine

## 2016-12-06 ENCOUNTER — Ambulatory Visit (INDEPENDENT_AMBULATORY_CARE_PROVIDER_SITE_OTHER): Payer: Medicare Other | Admitting: Family Medicine

## 2016-12-06 VITALS — BP 121/59 | HR 77 | Temp 98.3°F | Wt 232.0 lb

## 2016-12-06 DIAGNOSIS — E1149 Type 2 diabetes mellitus with other diabetic neurological complication: Secondary | ICD-10-CM | POA: Diagnosis not present

## 2016-12-06 DIAGNOSIS — M109 Gout, unspecified: Secondary | ICD-10-CM

## 2016-12-06 DIAGNOSIS — M1A9XX0 Chronic gout, unspecified, without tophus (tophi): Secondary | ICD-10-CM

## 2016-12-06 DIAGNOSIS — E1169 Type 2 diabetes mellitus with other specified complication: Secondary | ICD-10-CM

## 2016-12-06 DIAGNOSIS — E785 Hyperlipidemia, unspecified: Secondary | ICD-10-CM | POA: Diagnosis not present

## 2016-12-06 DIAGNOSIS — I10 Essential (primary) hypertension: Secondary | ICD-10-CM | POA: Diagnosis not present

## 2016-12-06 DIAGNOSIS — M5126 Other intervertebral disc displacement, lumbar region: Secondary | ICD-10-CM

## 2016-12-06 HISTORY — DX: Gout, unspecified: M10.9

## 2016-12-06 LAB — LIPID PANEL
Cholesterol: 131 mg/dL (ref 0–200)
HDL: 29.6 mg/dL — ABNORMAL LOW (ref >39.00)
LDL Cholesterol: 70 mg/dL (ref 0–99)
NonHDL: 101
Total CHOL/HDL Ratio: 4
Triglycerides: 157 mg/dL — ABNORMAL HIGH (ref 0.0–149.0)
VLDL: 31.4 mg/dL (ref 0.0–40.0)

## 2016-12-06 LAB — CBC
HCT: 43.4 % (ref 39.0–52.0)
Hemoglobin: 14.5 g/dL (ref 13.0–17.0)
MCHC: 33.4 g/dL (ref 30.0–36.0)
MCV: 94.8 fl (ref 78.0–100.0)
Platelets: 258 10*3/uL (ref 150.0–400.0)
RBC: 4.57 Mil/uL (ref 4.22–5.81)
RDW: 14.8 % (ref 11.5–15.5)
WBC: 8.6 10*3/uL (ref 4.0–10.5)

## 2016-12-06 LAB — HEMOGLOBIN A1C: Hgb A1c MFr Bld: 7.5 % — ABNORMAL HIGH (ref 4.6–6.5)

## 2016-12-06 LAB — COMPREHENSIVE METABOLIC PANEL
ALT: 20 U/L (ref 0–53)
AST: 18 U/L (ref 0–37)
Albumin: 4.3 g/dL (ref 3.5–5.2)
Alkaline Phosphatase: 79 U/L (ref 39–117)
BUN: 17 mg/dL (ref 6–23)
CO2: 32 mEq/L (ref 19–32)
Calcium: 9.5 mg/dL (ref 8.4–10.5)
Chloride: 101 mEq/L (ref 96–112)
Creatinine, Ser: 0.8 mg/dL (ref 0.40–1.50)
GFR: 101.78 mL/min (ref >60.00)
Glucose, Bld: 118 mg/dL — ABNORMAL HIGH (ref 70–99)
Potassium: 4.2 mEq/L (ref 3.5–5.1)
Sodium: 139 mEq/L (ref 135–145)
Total Bilirubin: 0.5 mg/dL (ref 0.2–1.2)
Total Protein: 7.4 g/dL (ref 6.0–8.3)

## 2016-12-06 LAB — TSH: TSH: 2.33 u[IU]/mL (ref 0.35–4.50)

## 2016-12-06 LAB — URIC ACID: Uric Acid, Serum: 6.8 mg/dL (ref 4.0–7.8)

## 2016-12-06 MED ORDER — HYDROCODONE-ACETAMINOPHEN 5-325 MG PO TABS: 1.0000 | ORAL_TABLET | Freq: Four times a day (QID) | ORAL | 0 refills | Status: DC | PRN

## 2016-12-06 NOTE — Progress Notes (Signed)
Patient ID: [name], male   DOB: 20-Dec-1945, 71 y.o.   MRN: [id number]

## 2016-12-06 NOTE — Assessment & Plan Note (Signed)
Well controlled, no changes to meds. Encouraged heart healthy diet such as the DASH diet and exercise as tolerated.  °

## 2016-12-06 NOTE — Progress Notes (Signed)
Pre visit review using our clinic review tool, if applicable. No additional management support is needed unless otherwise documented below in the visit note. 
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
## 2016-12-09 NOTE — Assessment & Plan Note (Signed)
Does not feel Tramadol helps him much even taking it several times each day. After long discussion it is agreed he can have one Hydrocodone daily as needed which he says has helped him morein the past. Agrees to contract and UDS and warned he may need to be referred to pain management for more care if use needs to increase.

## 2016-12-09 NOTE — Assessment & Plan Note (Signed)
hgba1c acceptable, minimize simple carbs. Increase exercise as tolerated. Continue current meds 

## 2016-12-09 NOTE — Assessment & Plan Note (Signed)
Tolerating statin, encouraged heart healthy diet, avoid trans fats, minimize simple carbs and saturated fats. Increase exercise as tolerated 

## 2016-12-09 NOTE — Assessment & Plan Note (Addendum)
recent flare in left foot, maintain adequate hydration and continue to monitor.

## 2016-12-10 ENCOUNTER — Encounter (HOSPITAL_COMMUNITY): Payer: Self-pay | Admitting: *Deleted

## 2016-12-10 ENCOUNTER — Emergency Department (HOSPITAL_COMMUNITY): Payer: Medicare Other

## 2016-12-10 ENCOUNTER — Emergency Department (HOSPITAL_COMMUNITY)
Admission: EM | Admit: 2016-12-10 | Discharge: 2016-12-10 | Disposition: A | Discharge status: Home / Self Care | Payer: Medicare Other | Attending: Emergency Medicine | Admitting: Emergency Medicine

## 2016-12-10 ENCOUNTER — Encounter: Payer: Self-pay | Admitting: Family Medicine

## 2016-12-10 DIAGNOSIS — Z87891 Personal history of nicotine dependence: Secondary | ICD-10-CM | POA: Diagnosis not present

## 2016-12-10 DIAGNOSIS — E119 Type 2 diabetes mellitus without complications: Secondary | ICD-10-CM | POA: Diagnosis not present

## 2016-12-10 DIAGNOSIS — Z79899 Other long term (current) drug therapy: Secondary | ICD-10-CM | POA: Diagnosis not present

## 2016-12-10 DIAGNOSIS — I10 Essential (primary) hypertension: Secondary | ICD-10-CM | POA: Insufficient documentation

## 2016-12-10 DIAGNOSIS — Z7982 Long term (current) use of aspirin: Secondary | ICD-10-CM | POA: Insufficient documentation

## 2016-12-10 DIAGNOSIS — M25551 Pain in right hip: Secondary | ICD-10-CM | POA: Insufficient documentation

## 2016-12-10 DIAGNOSIS — Z7984 Long term (current) use of oral hypoglycemic drugs: Secondary | ICD-10-CM | POA: Diagnosis not present

## 2016-12-10 DIAGNOSIS — Z8673 Personal history of transient ischemic attack (TIA), and cerebral infarction without residual deficits: Secondary | ICD-10-CM | POA: Diagnosis not present

## 2016-12-10 DIAGNOSIS — J449 Chronic obstructive pulmonary disease, unspecified: Secondary | ICD-10-CM | POA: Diagnosis not present

## 2016-12-10 DIAGNOSIS — M25559 Pain in unspecified hip: Secondary | ICD-10-CM

## 2016-12-10 MED ORDER — OXYCODONE-ACETAMINOPHEN 5-325 MG PO TABS: 1.0000 | ORAL_TABLET | Freq: Four times a day (QID) | ORAL | 0 refills | Status: DC | PRN

## 2016-12-10 MED ORDER — OXYCODONE-ACETAMINOPHEN 5-325 MG PO TABS
1.0000 | ORAL_TABLET | Freq: Once | ORAL | Status: AC
  Administered 2016-12-10: 1 via ORAL
  Filled 2016-12-10: qty 1

## 2016-12-10 NOTE — ED Notes (Signed)
Pt is in stable condition upon d/c and is escorted from ED via wheelchair. 

## 2016-12-10 NOTE — ED Notes (Signed)
Patient transported to X-ray 

## 2016-12-10 NOTE — ED Triage Notes (Addendum)
Pt states worked outside [employer].  That night he woke up with R hip pain that just gets worse.  Denies any injury.  Acuity 3 only d/t medical hx.

## 2016-12-10 NOTE — Discharge Instructions (Signed)
Return to the ED with any concerns including worsening pain, weakness of leg, not able to urinate, loss of control of bowel or bladder, fever/chills, or any other alarming symptoms
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
## 2016-12-11 ENCOUNTER — Other Ambulatory Visit: Payer: Self-pay | Admitting: Family Medicine

## 2016-12-11 MED ORDER — METFORMIN HCL 1000 MG PO TABS: ORAL_TABLET | ORAL | 1 refills | Status: DC

## 2016-12-11 MED ORDER — ATORVASTATIN CALCIUM 80 MG PO TABS: ORAL_TABLET | ORAL | 1 refills | Status: DC

## 2016-12-11 MED ORDER — CARVEDILOL 25 MG PO TABS: ORAL_TABLET | ORAL | 1 refills | Status: DC

## 2016-12-18 ENCOUNTER — Ambulatory Visit (INDEPENDENT_AMBULATORY_CARE_PROVIDER_SITE_OTHER): Payer: Medicare Other | Admitting: Family Medicine

## 2016-12-18 VITALS — BP 138/88 | HR 89 | Temp 97.4°F | Resp 18 | Ht 69.0 in | Wt 244.0 lb

## 2016-12-18 DIAGNOSIS — M545 Low back pain, unspecified: Secondary | ICD-10-CM

## 2016-12-18 DIAGNOSIS — M25551 Pain in right hip: Secondary | ICD-10-CM

## 2016-12-18 MED ORDER — PREDNISONE 20 MG PO TABS: ORAL_TABLET | ORAL | 0 refills | Status: DC

## 2016-12-18 MED ORDER — OXYCODONE-ACETAMINOPHEN 5-325 MG PO TABS: 1.0000 | ORAL_TABLET | Freq: Four times a day (QID) | ORAL | 0 refills | Status: DC | PRN

## 2016-12-18 MED ORDER — CYCLOBENZAPRINE HCL 5 MG PO TABS: 5.0000 mg | ORAL_TABLET | Freq: Three times a day (TID) | ORAL | 0 refills | Status: DC | PRN
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
## 2016-12-18 NOTE — Patient Instructions (Addendum)
Your pain may be due to both hip and low back. Start prednisone, monitor your blood sugars and if over 250, recommend follow-up here or your primary care provider to discuss plan while you are on prednisone. Flexeril up to every 8 hours, Percocet up to every 6 hours if needed for pain. I would recommend against combining the Flexeril and Percocet at the same time as both can cause sedation or dizziness. I referred you to orthopedist, but if the pain in the back persists or not improving in the next 1 week ith the prednisone, would recommend you also follow-up with neurosurgeon or follow-up here if needed sooner.   Return to the clinic or go to the nearest emergency room if any of your symptoms worsen or new symptoms occur.    IF you received an x-ray today, you will receive an invoice from [facility]. Please contact [facility] at [phone] with questions or concerns regarding your invoice.   IF you received labwork today, you will receive an invoice from [place]. Please contact [facility] at [phone] with questions or concerns regarding your invoice.   Our billing staff will not be able to assist you with questions regarding bills from these companies.  You will be contacted with the lab results as soon as they are available. The fastest way to get your results is to activate your My Chart account. Instructions are located on the last page of this paperwork. If you have not heard from [name] regarding the results in 2 weeks, please contact this office.

## 2016-12-19 ENCOUNTER — Telehealth: Payer: Self-pay

## 2016-12-19 NOTE — Telephone Encounter (Signed)
Patient needs Dr Carlota Raspberry to call him regarding his sugar levels.  His number is [phone]

## 2016-12-20 DIAGNOSIS — M7061 Trochanteric bursitis, right hip: Secondary | ICD-10-CM | POA: Diagnosis not present

## 2016-12-20 DIAGNOSIS — M545 Low back pain: Secondary | ICD-10-CM | POA: Diagnosis not present

## 2016-12-22 NOTE — Telephone Encounter (Signed)
Stopped steroids and sugars improved   Dr cramer stopped the steroid when he went for a hip injection

## 2017-01-05 ENCOUNTER — Other Ambulatory Visit: Payer: Self-pay | Admitting: Family Medicine

## 2017-01-07 DIAGNOSIS — E119 Type 2 diabetes mellitus without complications: Secondary | ICD-10-CM | POA: Diagnosis not present

## 2017-01-07 DIAGNOSIS — H52203 Unspecified astigmatism, bilateral: Secondary | ICD-10-CM | POA: Diagnosis not present

## 2017-01-07 DIAGNOSIS — H401131 Primary open-angle glaucoma, bilateral, mild stage: Secondary | ICD-10-CM | POA: Diagnosis not present

## 2017-01-07 DIAGNOSIS — H35371 Puckering of macula, right eye: Secondary | ICD-10-CM | POA: Diagnosis not present

## 2017-01-07 LAB — HM DIABETES EYE EXAM

## 2017-01-09 ENCOUNTER — Encounter: Payer: Self-pay | Admitting: Family Medicine

## 2017-01-10 ENCOUNTER — Other Ambulatory Visit: Payer: Self-pay

## 2017-01-10 DIAGNOSIS — L409 Psoriasis, unspecified: Secondary | ICD-10-CM | POA: Diagnosis not present

## 2017-01-10 MED ORDER — TRAMADOL HCL 50 MG PO TABS: 50.0000 mg | ORAL_TABLET | Freq: Three times a day (TID) | ORAL | 0 refills | Status: DC | PRN

## 2017-01-10 NOTE — Telephone Encounter (Signed)
Received Rx request for Tramadol 50mg  (take 1-2 tabs po TID prn for moderate to severe pain).  Last Rf: 11/08/2016 Last Ov: 12/06/2016 Next Ov: 03/14/2017 UDS: None  Forwarded to Provider for review, denial or approval.

## 2017-01-11 ENCOUNTER — Encounter: Payer: Self-pay | Admitting: Family Medicine

## 2017-02-12 ENCOUNTER — Encounter: Payer: Self-pay | Admitting: Family Medicine

## 2017-02-14 MED ORDER — TRAMADOL HCL 50 MG PO TABS: 50.0000 mg | ORAL_TABLET | Freq: Three times a day (TID) | ORAL | 0 refills | Status: DC | PRN

## 2017-02-23 ENCOUNTER — Ambulatory Visit (INDEPENDENT_AMBULATORY_CARE_PROVIDER_SITE_OTHER): Payer: Medicare Other | Admitting: Family Medicine

## 2017-02-23 ENCOUNTER — Ambulatory Visit (INDEPENDENT_AMBULATORY_CARE_PROVIDER_SITE_OTHER): Payer: Medicare Other

## 2017-02-23 VITALS — BP 145/73 | HR 83 | Temp 97.7°F | Resp 16 | Ht 69.0 in | Wt 234.0 lb

## 2017-02-23 DIAGNOSIS — K529 Noninfective gastroenteritis and colitis, unspecified: Secondary | ICD-10-CM | POA: Diagnosis not present

## 2017-02-23 DIAGNOSIS — R197 Diarrhea, unspecified: Secondary | ICD-10-CM

## 2017-02-23 DIAGNOSIS — M545 Low back pain: Secondary | ICD-10-CM | POA: Diagnosis not present

## 2017-02-23 DIAGNOSIS — M5441 Lumbago with sciatica, right side: Secondary | ICD-10-CM | POA: Diagnosis not present

## 2017-02-23 DIAGNOSIS — R112 Nausea with vomiting, unspecified: Secondary | ICD-10-CM | POA: Diagnosis not present

## 2017-02-23 DIAGNOSIS — F418 Other specified anxiety disorders: Secondary | ICD-10-CM

## 2017-02-23 MED ORDER — ALPRAZOLAM 0.25 MG PO TABS: ORAL_TABLET | ORAL | 0 refills | Status: DC

## 2017-02-23 MED ORDER — CYCLOBENZAPRINE HCL 5 MG PO TABS: ORAL_TABLET | ORAL | 0 refills | Status: DC

## 2017-02-23 MED ORDER — HYDROCODONE-ACETAMINOPHEN 5-325 MG PO TABS: 1.0000 | ORAL_TABLET | Freq: Four times a day (QID) | ORAL | 0 refills | Status: DC | PRN

## 2017-02-23 NOTE — Patient Instructions (Addendum)
Nausea/vomiting/diarrhea was likely form food or virus.  As improved, no further workup for now, but return if worsening. Bland foods, water most important initially.   For back pain, it is possible that the disc disease has flared again. Usually I would recommend prednisone to try to calm down inflammation, especially while waiting for MRI and having weakness. However with your reactions and side effects with prednisone previously, as well as elevations of your blood sugar, will hold off on that at this time. Okay to change tramadol 2 hydrocodone 1-2 every 6 hours as needed for pain. Heat, gentle range of motion if needed. Flexeril if needed up to every 8 hours, but be careful combining that with hydrocodone or other sedating medications.  I will order an MRI as well as refer you to neurosurgery, but if any worsening of symptoms, increasing weakness, any loss of control of bowel or bladder function or other acute worsening symptoms, proceed to the emergency room. I did write for a few Xanax to take prior to her MRI if needed for claustrophobia/anxiety. Start with one pill approximately 30 minutes beforehand, and second pill only if needed.    Gastroenteritis:  Diarrhea Infections caused by germs (bacterial) or a virus commonly cause diarrhea. Your caregiver has determined that with time, rest and fluids, the diarrhea should improve. In general, eat normally while drinking more water than usual. Although water may prevent dehydration, it does not contain salt and minerals (electrolytes). Broths, weak tea without caffeine and oral rehydration solutions (ORS) replace fluids and electrolytes. Small amounts of fluids should be taken frequently. Large amounts at one time may not be tolerated. Plain water may be harmful in infants and the elderly. Oral rehydrating solutions (ORS) are available at pharmacies and grocery stores. ORS replace water and important electrolytes in proper proportions. Sports drinks  are not as effective as ORS and may be harmful due to sugars worsening diarrhea.  ORS is especially recommended for use in children with diarrhea. As a general guideline for children, replace any new fluid losses from diarrhea and/or vomiting with ORS as follows:   If your child weighs 22 pounds or under (10 kg or less), give 60-120 mL ( -  cup or 2 - 4 ounces) of ORS for each episode of diarrheal stool or vomiting episode.   If your child weighs more than 22 pounds (more than 10 kgs), give 120-240 mL ( - 1 cup or 4 - 8 ounces) of ORS for each diarrheal stool or episode of vomiting.   While correcting for dehydration, children should eat normally. However, foods high in sugar should be avoided because this may worsen diarrhea. Large amounts of carbonated soft drinks, juice, gelatin desserts and other highly sugared drinks should be avoided.   After correction of dehydration, other liquids that are appealing to the child may be added. Children should drink small amounts of fluids frequently and fluids should be increased as tolerated. Children should drink enough fluids to keep urine clear or pale yellow.   Adults should eat normally while drinking more fluids than usual. Drink small amounts of fluids frequently and increase as tolerated. Drink enough fluids to keep urine clear or pale yellow. Broths, weak decaffeinated tea, lemon lime soft drinks (allowed to go flat) and ORS replace fluids and electrolytes.   Avoid:   Carbonated drinks.   Juice.   Extremely hot or cold fluids.   Caffeine drinks.   Fatty, greasy foods.   Alcohol.   Tobacco.  Too much intake of anything at one time.   Gelatin desserts.   Probiotics are active cultures of beneficial bacteria. They may lessen the amount and number of diarrheal stools in adults. Probiotics can be found in yogurt with active cultures and in supplements.   Wash hands well to avoid spreading bacteria and virus.   Anti-diarrheal  medications are not recommended for infants and children.   Only take over-the-counter or prescription medicines for pain, discomfort or fever as directed by your caregiver. Do not give aspirin to children because it may cause Reye's Syndrome.   For adults, ask your caregiver if you should continue all prescribed and over-the-counter medicines.   If your caregiver has given you a follow-up appointment, it is very important to keep that appointment. Not keeping the appointment could result in a chronic or permanent injury, and disability. If there is any problem keeping the appointment, you must call back to this facility for assistance.  SEEK IMMEDIATE MEDICAL CARE IF:   You or your child is unable to keep fluids down or other symptoms or problems become worse in spite of treatment.   Vomiting or diarrhea develops and becomes persistent.   There is vomiting of blood or bile (green material).   There is blood in the stool or the stools are black and tarry.   There is no urine output in 6-8 hours or there is only a small amount of very dark urine.   Abdominal pain develops, increases or localizes.   You have a fever.   Your baby is older than 3 months with a rectal temperature of 102 F (38.9 C) or higher.   Your baby is 11 months old or younger with a rectal temperature of 100.4 F (38 C) or higher.   You or your child develops excessive weakness, dizziness, fainting or extreme thirst.   You or your child develops a rash, stiff neck, severe headache or become irritable or sleepy and difficult to awaken.  MAKE SURE YOU:   Understand these instructions.   Will watch your condition.   Will get help right away if you are not doing well or get worse.  Document Released: 10/12/2002 Document Revised: 10/11/2011 Document Reviewed: 08/29/2009 [facility] Patient Information 2012 [place].  Nausea and Vomiting Nausea is a sick feeling that often comes before throwing up (vomiting).  Vomiting is a reflex where stomach contents come out of your mouth. Vomiting can cause severe loss of body fluids (dehydration). Children and elderly adults can become dehydrated quickly, especially if they also have diarrhea. Nausea and vomiting are symptoms of a condition or disease. It is important to find the cause of your symptoms. CAUSES   Direct irritation of the stomach lining. This irritation can result from increased acid production (gastroesophageal reflux disease), infection, food poisoning, taking certain medicines (such as nonsteroidal anti-inflammatory drugs), alcohol use, or tobacco use.   Signals from the brain.These signals could be caused by a headache, heat exposure, an inner ear disturbance, increased pressure in the brain from injury, infection, a tumor, or a concussion, pain, emotional stimulus, or metabolic problems.   An obstruction in the gastrointestinal tract (bowel obstruction).   Illnesses such as diabetes, hepatitis, gallbladder problems, appendicitis, kidney problems, cancer, sepsis, atypical symptoms of a heart attack, or eating disorders.   Medical treatments such as chemotherapy and radiation.   Receiving medicine that makes you sleep (general anesthetic) during surgery.  DIAGNOSIS Your caregiver may ask for tests to be done if the  problems do not improve after a few days. Tests may also be done if symptoms are severe or if the reason for the nausea and vomiting is not clear. Tests may include:  Urine tests.   Blood tests.   Stool tests.   Cultures (to look for evidence of infection).   X-rays or other imaging studies.  Test results can help your caregiver make decisions about treatment or the need for additional tests. TREATMENT You need to stay well hydrated. Drink frequently but in small amounts.You may wish to drink water, sports drinks, clear broth, or eat frozen ice pops or gelatin dessert to help stay hydrated.When you eat, eating slowly may  help prevent nausea.There are also some antinausea medicines that may help prevent nausea. HOME CARE INSTRUCTIONS   Take all medicine as directed by your caregiver.   If you do not have an appetite, do not force yourself to eat. However, you must continue to drink fluids.   If you have an appetite, eat a normal diet unless your caregiver tells you differently.   Eat a variety of complex carbohydrates (rice, wheat, potatoes, bread), lean meats, yogurt, fruits, and vegetables.   Avoid high-fat foods because they are more difficult to digest.   Drink enough water and fluids to keep your urine clear or pale yellow.   If you are dehydrated, ask your caregiver for specific rehydration instructions. Signs of dehydration may include:   Severe thirst.   Dry lips and mouth.   Dizziness.   Dark urine.   Decreasing urine frequency and amount.   Confusion.   Rapid breathing or pulse.  SEEK IMMEDIATE MEDICAL CARE IF:   You have blood or brown flecks (like coffee grounds) in your vomit.   You have black or bloody stools.   You have a severe headache or stiff neck.   You are confused.   You have severe abdominal pain.   You have chest pain or trouble breathing.   You do not urinate at least once every 8 hours.   You develop cold or clammy skin.   You continue to vomit for longer than 24 to 48 hours.   You have a fever.  MAKE SURE YOU:   Understand these instructions.   Will watch your condition.   Will get help right away if you are not doing well or get worse.  Document Released: 10/22/2005 Document Revised: 10/11/2011 Document Reviewed: 03/21/2011 [facility] Patient Information 2012 [place], [place].  Return to the clinic or go to the nearest emergency room if any of your symptoms worsen or new symptoms occur.      Sciatica Sciatica is pain, numbness, weakness, or tingling along the path of the sciatic nerve. The sciatic nerve starts in the lower back and runs down  the back of each leg. The nerve controls the muscles in the lower leg and in the back of the knee. It also provides feeling (sensation) to the back of the thigh, the lower leg, and the sole of the foot. Sciatica is a symptom of another medical condition that pinches or puts pressure on the sciatic nerve. Generally, sciatica only affects one side of the body. Sciatica usually goes away on its own or with treatment. In some cases, sciatica may keep coming back (recur). What are the causes? This condition is caused by pressure on the sciatic nerve, or pinching of the sciatic nerve. This may be the result of:  A disk in between the bones of the spine (vertebrae) bulging  out too far (herniated disk).  Age-related changes in the spinal disks (degenerative disk disease).  A pain disorder that affects a muscle in the buttock (piriformis syndrome).  Extra bone growth (bone spur) near the sciatic nerve.  An injury or break (fracture) of the pelvis.  Pregnancy.  Tumor (rare). What increases the risk? The following factors may make you more likely to develop this condition:  Playing sports that place pressure or stress on the spine, such as football or weight lifting.  Having poor strength and flexibility.  A history of back injury.  A history of back surgery.  Sitting for long periods of time.  Doing activities that involve repetitive bending or lifting.  Obesity. What are the signs or symptoms? Symptoms can vary from mild to very severe, and they may include:  Any of these problems in the lower back, leg, hip, or buttock:  Mild tingling or dull aches.  Burning sensations.  Sharp pains.  Numbness in the back of the calf or the sole of the foot.  Leg weakness.  Severe back pain that makes movement difficult. These symptoms may get worse when you cough, sneeze, or laugh, or when you sit or stand for long periods of time. Being overweight may also make symptoms worse. In some  cases, symptoms may recur over time. How is this diagnosed? This condition may be diagnosed based on:  Your symptoms.  A physical exam. Your health care provider may ask you to do certain movements to check whether those movements trigger your symptoms.  You may have tests, including:  Blood tests.  X-rays.  MRI.  CT scan. How is this treated? In many cases, this condition improves on its own, without any treatment. However, treatment may include:  Reducing or modifying physical activity during periods of pain.  Exercising and stretching to strengthen your abdomen and improve the flexibility of your spine.  Icing and applying heat to the affected area.  Medicines that help:  To relieve pain and swelling.  To relax your muscles.  Injections of medicines that help to relieve pain, irritation, and inflammation around the sciatic nerve (steroids).  Surgery. Follow these instructions at home: Medicines   Take over-the-counter and prescription medicines only as told by your health care provider.  Do not drive or operate heavy machinery while taking prescription pain medicine. Managing pain   If directed, apply ice to the affected area.  Put ice in a plastic bag.  Place a towel between your skin and the bag.  Leave the ice on for 20 minutes, 2-3 times a day.  After icing, apply heat to the affected area before you exercise or as often as told by your health care provider. Use the heat source that your health care provider recommends, such as a moist heat pack or a heating pad.  Place a towel between your skin and the heat source.  Leave the heat on for 20-30 minutes.  Remove the heat if your skin turns bright red. This is especially important if you are unable to feel pain, heat, or cold. You may have a greater risk of getting burned. Activity   Return to your normal activities as told by your health care provider. Ask your health care provider what activities are  safe for you.  Avoid activities that make your symptoms worse.  Take brief periods of rest throughout the day. Resting in a lying or standing position is usually better than sitting to rest.  When you rest for  longer periods, mix in some mild activity or stretching between periods of rest. This will help to prevent stiffness and pain.  Avoid sitting for long periods of time without moving. Get up and move around at least one time each hour.  Exercise and stretch regularly, as told by your health care provider.  Do not lift anything that is heavier than 10 lb (4.5 kg) while you have symptoms of sciatica. When you do not have symptoms, you should still avoid heavy lifting, especially repetitive heavy lifting.  When you lift objects, always use proper lifting technique, which includes:  Bending your knees.  Keeping the load close to your body.  Avoiding twisting. General instructions   Use good posture.  Avoid leaning forward while sitting.  Avoid hunching over while standing.  Maintain a healthy weight. Excess weight puts extra stress on your back and makes it difficult to maintain good posture.  Wear supportive, comfortable shoes. Avoid wearing high heels.  Avoid sleeping on a mattress that is too soft or too hard. A mattress that is firm enough to support your back when you sleep may help to reduce your pain.  Keep all follow-up visits as told by your health care provider. This is important. Contact a health care provider if:  You have pain that wakes you up when you are sleeping.  You have pain that gets worse when you lie down.  Your pain is worse than you have experienced in the past.  Your pain lasts longer than 4 weeks.  You experience unexplained weight loss. Get help right away if:  You lose control of your bowel or bladder (incontinence).  You have:  Weakness in your lower back, pelvis, buttocks, or legs that gets worse.  Redness or swelling of your  back.  A burning sensation when you urinate. This information is not intended to replace advice given to you by your health care provider. Make sure you discuss any questions you have with your health care provider. Document Released: 10/16/2001 Document Revised: 03/27/2016 Document Reviewed: 07/01/2015 [employer] Interactive Patient Education  2017 [employer].    IF you received an x-ray today, you will receive an invoice from [facility]. Please contact [facility] at [phone] with questions or concerns regarding your invoice.   IF you received labwork today, you will receive an invoice from [facility]. Please contact [facility] at [phone] with questions or concerns regarding your invoice.   Our billing staff will not be able to assist you with questions regarding bills from these companies.  You will be contacted with the lab results as soon as they are available. The fastest way to get your results is to activate your My Chart account. Instructions are located on the last page of this paperwork. If you have not heard from [name] regarding the results in 2 weeks, please contact this office.
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
## 2017-03-03 ENCOUNTER — Ambulatory Visit
Admission: RE | Admit: 2017-03-03 | Discharge: 2017-03-03 | Disposition: A | Discharge status: Home / Self Care | Payer: Medicare Other | Source: Ambulatory Visit | Attending: Family Medicine | Admitting: Family Medicine

## 2017-03-03 DIAGNOSIS — M5441 Lumbago with sciatica, right side: Secondary | ICD-10-CM

## 2017-03-03 DIAGNOSIS — M48061 Spinal stenosis, lumbar region without neurogenic claudication: Secondary | ICD-10-CM | POA: Diagnosis not present

## 2017-03-06 ENCOUNTER — Ambulatory Visit: Payer: Medicare Other | Admitting: Podiatry

## 2017-03-07 DIAGNOSIS — I10 Essential (primary) hypertension: Secondary | ICD-10-CM | POA: Diagnosis not present

## 2017-03-07 DIAGNOSIS — M48062 Spinal stenosis, lumbar region with neurogenic claudication: Secondary | ICD-10-CM | POA: Diagnosis not present

## 2017-03-07 DIAGNOSIS — M7138 Other bursal cyst, other site: Secondary | ICD-10-CM | POA: Diagnosis not present

## 2017-03-11 ENCOUNTER — Other Ambulatory Visit: Payer: Self-pay | Admitting: Neurosurgery

## 2017-03-12 ENCOUNTER — Other Ambulatory Visit: Payer: Self-pay | Admitting: Family Medicine

## 2017-03-14 ENCOUNTER — Ambulatory Visit (INDEPENDENT_AMBULATORY_CARE_PROVIDER_SITE_OTHER): Payer: Medicare Other | Admitting: Family Medicine

## 2017-03-14 ENCOUNTER — Encounter: Payer: Self-pay | Admitting: Family Medicine

## 2017-03-14 VITALS — BP 113/60 | HR 81 | Temp 98.6°F | Wt 238.4 lb

## 2017-03-14 DIAGNOSIS — E785 Hyperlipidemia, unspecified: Secondary | ICD-10-CM | POA: Diagnosis not present

## 2017-03-14 DIAGNOSIS — M5441 Lumbago with sciatica, right side: Secondary | ICD-10-CM

## 2017-03-14 DIAGNOSIS — E1149 Type 2 diabetes mellitus with other diabetic neurological complication: Secondary | ICD-10-CM

## 2017-03-14 DIAGNOSIS — I10 Essential (primary) hypertension: Secondary | ICD-10-CM | POA: Diagnosis not present

## 2017-03-14 DIAGNOSIS — M109 Gout, unspecified: Secondary | ICD-10-CM

## 2017-03-14 DIAGNOSIS — G4730 Sleep apnea, unspecified: Secondary | ICD-10-CM

## 2017-03-14 MED ORDER — TRAMADOL HCL 50 MG PO TABS: 50.0000 mg | ORAL_TABLET | Freq: Three times a day (TID) | ORAL | 0 refills | Status: DC | PRN

## 2017-03-14 MED ORDER — HYDROCODONE-ACETAMINOPHEN 5-325 MG PO TABS: 1.0000 | ORAL_TABLET | Freq: Four times a day (QID) | ORAL | 0 refills | Status: DC | PRN

## 2017-03-14 NOTE — Assessment & Plan Note (Signed)
hgba1c acceptable, minimize simple carbs. Increase exercise as tolerated.  

## 2017-03-14 NOTE — Patient Instructions (Signed)
Carbohydrate Counting for Diabetes Mellitus, Adult Carbohydrate counting is a method for keeping track of how many carbohydrates you eat. Eating carbohydrates naturally increases the amount of sugar (glucose) in the blood. Counting how many carbohydrates you eat helps keep your blood glucose within normal limits, which helps you manage your diabetes (diabetes mellitus). It is important to know how many carbohydrates you can safely have in each meal. This is different for every person. A diet and nutrition specialist (registered dietitian) can help you make a meal plan and calculate how many carbohydrates you should have at each meal and snack. Carbohydrates are found in the following foods:  Grains, such as breads and cereals.  Dried beans and soy products.  Starchy vegetables, such as potatoes, peas, and corn.  Fruit and fruit juices.  Milk and yogurt.  Sweets and snack foods, such as cake, cookies, candy, chips, and soft drinks. How do I count carbohydrates? There are two ways to count carbohydrates in food. You can use either of the methods or a combination of both. Reading "Nutrition Facts" on packaged food  The "Nutrition Facts" list is included on the labels of almost all packaged foods and beverages in the [place] It includes:  The serving size.  Information about nutrients in each serving, including the grams (g) of carbohydrate per serving. To use the "Nutrition Facts":  Decide how many servings you will have.  Multiply the number of servings by the number of carbohydrates per serving.  The resulting number is the total amount of carbohydrates that you will be having. Learning standard serving sizes of other foods  When you eat foods containing carbohydrates that are not packaged or do not include "Nutrition Facts" on the label, you need to measure the servings in order to count the amount of carbohydrates:  Measure the foods that you will eat with a food scale or measuring  cup, if needed.  Decide how many standard-size servings you will eat.  Multiply the number of servings by 15. Most carbohydrate-rich foods have about 15 g of carbohydrates per serving.  For example, if you eat 8 oz (170 g) of strawberries, you will have eaten 2 servings and 30 g of carbohydrates (2 servings x 15 g = 30 g).  For foods that have more than one food mixed, such as soups and casseroles, you must count the carbohydrates in each food that is included. The following list contains standard serving sizes of common carbohydrate-rich foods. Each of these servings has about 15 g of carbohydrates:   hamburger bun or  English muffin.   oz (15 mL) syrup.   oz (14 g) jelly.  1 slice of bread.  1 six-inch tortilla.  3 oz (85 g) cooked rice or pasta.  4 oz (113 g) cooked dried beans.  4 oz (113 g) starchy vegetable, such as peas, corn, or potatoes.  4 oz (113 g) hot cereal.  4 oz (113 g) mashed potatoes or  of a large baked potato.  4 oz (113 g) canned or frozen fruit.  4 oz (120 mL) fruit juice.  4-6 crackers.  6 chicken nuggets.  6 oz (170 g) unsweetened dry cereal.  6 oz (170 g) plain fat-free yogurt or yogurt sweetened with artificial sweeteners.  8 oz (240 mL) milk.  8 oz (170 g) fresh fruit or one small piece of fruit.  24 oz (680 g) popped popcorn. Example of carbohydrate counting Sample meal  3 oz (85 g) chicken breast.  6 oz (  170 g) brown rice.  4 oz (113 g) corn.  8 oz (240 mL) milk.  8 oz (170 g) strawberries with sugar-free whipped topping. Carbohydrate calculation 1. Identify the foods that contain carbohydrates:  Rice.  Corn.  Milk.  Strawberries. 2. Calculate how many servings you have of each food:  2 servings rice.  1 serving corn.  1 serving milk.  1 serving strawberries. 3. Multiply each number of servings by 15 g:  2 servings rice x 15 g = 30 g.  1 serving corn x 15 g = 15 g.  1 serving milk x 15 g = 15  g.  1 serving strawberries x 15 g = 15 g. 4. Add together all of the amounts to find the total grams of carbohydrates eaten:  30 g + 15 g + 15 g + 15 g = 75 g of carbohydrates total. This information is not intended to replace advice given to you by your health care provider. Make sure you discuss any questions you have with your health care provider. Document Released: 10/22/2005 Document Revised: 05/11/2016 Document Reviewed: 04/04/2016 Elsevier Interactive Patient Education  2017 Elsevier Inc.  
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
## 2017-03-14 NOTE — Assessment & Plan Note (Signed)
Encouraged heart healthy diet, increase exercise, avoid trans fats, consider a krill oil cap daily 

## 2017-03-14 NOTE — Assessment & Plan Note (Signed)
Well controlled, no changes to meds. Encouraged heart healthy diet such as the DASH diet and exercise as tolerated.  °

## 2017-03-14 NOTE — Progress Notes (Signed)
Pre visit review using our clinic review tool, if applicable. No additional management support is needed unless otherwise documented below in the visit note. 

## 2017-03-15 LAB — LIPID PANEL
Cholesterol: 116 mg/dL (ref 0–200)
HDL: 30.6 mg/dL — ABNORMAL LOW (ref >39.00)
LDL Cholesterol: 53 mg/dL (ref 0–99)
NonHDL: 85.17
Total CHOL/HDL Ratio: 4
Triglycerides: 163 mg/dL — ABNORMAL HIGH (ref 0.0–149.0)
VLDL: 32.6 mg/dL (ref 0.0–40.0)

## 2017-03-15 LAB — CBC
HCT: 42.2 % (ref 39.0–52.0)
Hemoglobin: 14 g/dL (ref 13.0–17.0)
MCHC: 33.1 g/dL (ref 30.0–36.0)
MCV: 96.9 fl (ref 78.0–100.0)
Platelets: 236 10*3/uL (ref 150.0–400.0)
RBC: 4.36 Mil/uL (ref 4.22–5.81)
RDW: 14.9 % (ref 11.5–15.5)
WBC: 6.6 10*3/uL (ref 4.0–10.5)

## 2017-03-15 LAB — COMPREHENSIVE METABOLIC PANEL
ALT: 20 U/L (ref 0–53)
AST: 16 U/L (ref 0–37)
Albumin: 4.3 g/dL (ref 3.5–5.2)
Alkaline Phosphatase: 60 U/L (ref 39–117)
BUN: 13 mg/dL (ref 6–23)
CO2: 32 mEq/L (ref 19–32)
Calcium: 9.4 mg/dL (ref 8.4–10.5)
Chloride: 104 mEq/L (ref 96–112)
Creatinine, Ser: 1 mg/dL (ref 0.40–1.50)
GFR: 78.61 mL/min (ref >60.00)
Glucose, Bld: 137 mg/dL — ABNORMAL HIGH (ref 70–99)
Potassium: 4.2 mEq/L (ref 3.5–5.1)
Sodium: 143 mEq/L (ref 135–145)
Total Bilirubin: 0.4 mg/dL (ref 0.2–1.2)
Total Protein: 7 g/dL (ref 6.0–8.3)

## 2017-03-15 LAB — TSH: TSH: 1.74 u[IU]/mL (ref 0.35–4.50)

## 2017-03-15 LAB — URIC ACID: Uric Acid, Serum: 6.9 mg/dL (ref 4.0–7.8)

## 2017-03-15 LAB — HEMOGLOBIN A1C: Hgb A1c MFr Bld: 7 % — ABNORMAL HIGH (ref 4.6–6.5)

## 2017-03-17 DIAGNOSIS — M549 Dorsalgia, unspecified: Secondary | ICD-10-CM | POA: Insufficient documentation

## 2017-03-17 DIAGNOSIS — M545 Low back pain, unspecified: Secondary | ICD-10-CM | POA: Insufficient documentation

## 2017-03-17 HISTORY — DX: Dorsalgia, unspecified: M54.9

## 2017-03-17 NOTE — Assessment & Plan Note (Signed)
Encouraged moist heat and gentle stretching as tolerated. May try NSAIDs and prescription meds as directed and report if symptoms worsen or seek immediate care,=. Allowed a small supply of hydrocodone to help him manage until his back surgery with Dr Earle Gell next month

## 2017-03-17 NOTE — Assessment & Plan Note (Signed)
No recent flares, continue to monitor.  

## 2017-03-21 DIAGNOSIS — H401131 Primary open-angle glaucoma, bilateral, mild stage: Secondary | ICD-10-CM | POA: Diagnosis not present

## 2017-04-03 ENCOUNTER — Ambulatory Visit (INDEPENDENT_AMBULATORY_CARE_PROVIDER_SITE_OTHER): Payer: Medicare Other | Admitting: Podiatry

## 2017-04-03 ENCOUNTER — Encounter: Payer: Self-pay | Admitting: Podiatry

## 2017-04-03 VITALS — BP 142/59 | HR 86 | Resp 18

## 2017-04-03 DIAGNOSIS — E1149 Type 2 diabetes mellitus with other diabetic neurological complication: Secondary | ICD-10-CM | POA: Diagnosis not present

## 2017-04-03 DIAGNOSIS — M79676 Pain in unspecified toe(s): Secondary | ICD-10-CM

## 2017-04-03 DIAGNOSIS — B351 Tinea unguium: Secondary | ICD-10-CM | POA: Diagnosis not present

## 2017-04-03 DIAGNOSIS — L84 Corns and callosities: Secondary | ICD-10-CM | POA: Diagnosis not present

## 2017-04-03 NOTE — Patient Instructions (Signed)

## 2017-04-03 NOTE — Progress Notes (Signed)
Patient ID: [name], male   DOB: [birth year], 71 y.o.   MRN: [id number]    Subjective: Patient presents today complaining of elongated and thickened toenails which are uncomfortable when walking wearing shoes and requests nail debridement. Also has concern about a callus on the left hallux.  Patient has a pending back surgery scheduled  Objective: DP pulses 2/4 right 0/4 left PT pulse right 2/4 and 1/4 left Capillary reflex immediate bilaterally Sensation to 10 g monofilament wire intact 4/5 right and 2/5 left Vibratory sensation nonreactive bilaterally Ankle reflex equal and reactive bilaterally HAV bilaterally No open skin lesions bilaterally Atrophic skin with absent hair growth bilaterally Large well-organized callus medial left hallux with small amount of bleeding within the callus that remains closed after debridement The toenails are elongated, brittle, deformed, discolored and tender direct palpation 6-10  Assessment: Diabetic with peripheral neuropathy Symptomatic onychomycoses 6-10 Pre-ulcerative callus left hallux  Plan: Debridement toenails 6-10 mechanical and electrical without a bleeding Debrided pre-ulcerative callus left 1 without any bleeding  Reappoint 3 months

## 2017-04-05 ENCOUNTER — Ambulatory Visit (HOSPITAL_BASED_OUTPATIENT_CLINIC_OR_DEPARTMENT_OTHER)
Admission: RE | Admit: 2017-04-05 | Discharge: 2017-04-05 | Disposition: A | Discharge status: Home / Self Care | Payer: Medicare Other | Source: Ambulatory Visit | Attending: Physician Assistant | Admitting: Physician Assistant

## 2017-04-05 ENCOUNTER — Ambulatory Visit (INDEPENDENT_AMBULATORY_CARE_PROVIDER_SITE_OTHER): Payer: Medicare Other | Admitting: Physician Assistant

## 2017-04-05 ENCOUNTER — Encounter: Payer: Self-pay | Admitting: Physician Assistant

## 2017-04-05 ENCOUNTER — Telehealth: Payer: Self-pay | Admitting: Physician Assistant

## 2017-04-05 ENCOUNTER — Ambulatory Visit (INDEPENDENT_AMBULATORY_CARE_PROVIDER_SITE_OTHER): Payer: Medicare Other

## 2017-04-05 VITALS — BP 102/64 | HR 84 | Temp 97.4°F | Resp 18 | Ht 69.0 in | Wt 239.4 lb

## 2017-04-05 DIAGNOSIS — R600 Localized edema: Secondary | ICD-10-CM
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
## 2017-04-05 NOTE — Telephone Encounter (Signed)
Call report from vascular lab. NEGATIVE for DVT in both legs. Patient notified. Elevate. Reduce salt. Increase water. Follow-up with [name].
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
## 2017-04-05 NOTE — Patient Instructions (Addendum)
GO TO  [facility] AT 5PM  [address] [place], [place] [address]   IF you received an x-ray today, you will receive an invoice from [facility]. Please contact [facility] at [phone] with questions or concerns regarding your invoice.   IF you received labwork today, you will receive an invoice from [facility]. Please contact [facility] at [phone] with questions or concerns regarding your invoice.   Our billing staff will not be able to assist you with questions regarding bills from these companies.  You will be contacted with the lab results as soon as they are available. The fastest way to get your results is to activate your My Chart account. Instructions are located on the last page of this paperwork. If you have not heard from [name] regarding the results in 2 weeks, please contact this office.

## 2017-04-08 ENCOUNTER — Ambulatory Visit (INDEPENDENT_AMBULATORY_CARE_PROVIDER_SITE_OTHER): Payer: Medicare Other | Admitting: Family Medicine

## 2017-04-08 DIAGNOSIS — E1149 Type 2 diabetes mellitus with other diabetic neurological complication: Secondary | ICD-10-CM

## 2017-04-08 DIAGNOSIS — I10 Essential (primary) hypertension: Secondary | ICD-10-CM

## 2017-04-08 DIAGNOSIS — N289 Disorder of kidney and ureter, unspecified: Secondary | ICD-10-CM | POA: Diagnosis not present

## 2017-04-08 DIAGNOSIS — R197 Diarrhea, unspecified: Secondary | ICD-10-CM

## 2017-04-08 DIAGNOSIS — I714 Abdominal aortic aneurysm, without rupture, unspecified: Secondary | ICD-10-CM

## 2017-04-08 NOTE — Patient Instructions (Addendum)
Food Choices to Help Relieve Diarrhea, Adult When you have diarrhea, the foods you eat and your eating habits are very important. Choosing the right foods and drinks can help:  Relieve diarrhea.  Replace lost fluids and nutrients.  Prevent dehydration.  What general guidelines should I follow? Relieving diarrhea  Choose foods with less than 2 g or .07 oz. of fiber per serving.  Limit fats to less than 8 tsp (38 g or 1.34 oz.) a day.  Avoid the following: ? Foods and beverages sweetened with high-fructose corn syrup, honey, or sugar alcohols such as xylitol, sorbitol, and mannitol. ? Foods that contain a lot of fat or sugar. ? Fried, greasy, or spicy foods. ? High-fiber grains, breads, and cereals. ? Raw fruits and vegetables.  Eat foods that are rich in probiotics. These foods include dairy products such as yogurt and fermented milk products. They help increase healthy bacteria in the stomach and intestines (gastrointestinal tract, or GI tract).  If you have lactose intolerance, avoid dairy products. These may make your diarrhea worse.  Take medicine to help stop diarrhea (antidiarrheal medicine) only as told by your health care provider. Replacing nutrients  Eat small meals or snacks every 3-4 hours.  Eat bland foods, such as white rice, toast, or baked potato, until your diarrhea starts to get better. Gradually reintroduce nutrient-rich foods as tolerated or as told by your health care provider. This includes: ? Well-cooked protein foods. ? Peeled, seeded, and soft-cooked fruits and vegetables. ? Low-fat dairy products.  Take vitamin and mineral supplements as told by your health care provider. Preventing dehydration   Start by sipping water or a special solution to prevent dehydration (oral rehydration solution, ORS). Urine that is clear or pale yellow means that you are getting enough fluid.  Try to drink at least 8-10 cups of fluid each day to help replace lost  fluids.  You may add other liquids in addition to water, such as clear juice or decaffeinated sports drinks, as tolerated or as told by your health care provider.  Avoid drinks with caffeine, such as coffee, tea, or soft drinks.  Avoid alcohol. What foods are recommended? The items listed may not be a complete list. Talk with your health care provider about what dietary choices are best for you. Grains White rice. White, French, or pita breads (fresh or toasted), including plain rolls, buns, or bagels. White pasta. Saltine, soda, or graham crackers. Pretzels. Low-fiber cereal. Cooked cereals made with water (such as cornmeal, farina, or cream cereals). Plain muffins. Matzo. Melba toast. [place]. Vegetables Potatoes (without the skin). Most well-cooked and canned vegetables without skins or seeds. Tender lettuce. Fruits Apple sauce. Fruits canned in juice. Cooked apricots, cherries, grapefruit, peaches, pears, or plums. Fresh bananas and cantaloupe. Meats and other protein foods Baked or boiled chicken. Eggs. Tofu. Fish. Seafood. Smooth nut butters. Ground or well-cooked tender beef, ham, veal, lamb, pork, or poultry. Dairy Plain yogurt, kefir, and unsweetened liquid yogurt. Lactose-free milk, buttermilk, skim milk, or soy milk. Low-fat or nonfat hard cheese. Beverages Water. Low-calorie sports drinks. Fruit juices without pulp. Strained tomato and vegetable juices. Decaffeinated teas. Sugar-free beverages not sweetened with sugar alcohols. Oral rehydration solutions, if approved by your health care provider. Seasoning and other foods Bouillon, broth, or soups made from recommended foods. What foods are not recommended? The items listed may not be a complete list. Talk with your health care provider about what dietary choices are best for you. Grains Whole grain, whole wheat,   bran, or rye breads, rolls, pastas, and crackers. Wild or brown rice. Whole grain or bran cereals. Barley. Oats and  oatmeal. Corn tortillas or taco shells. Granola. Popcorn. Vegetables Raw vegetables. Fried vegetables. Cabbage, broccoli, Brussels sprouts, artichokes, baked beans, beet greens, corn, kale, legumes, peas, sweet potatoes, and yams. Potato skins. Cooked spinach and cabbage. Fruits Dried fruit, including raisins and dates. Raw fruits. Stewed or dried prunes. Canned fruits with syrup. Meat and other protein foods Fried or fatty meats. Deli meats. Chunky nut butters. Nuts and seeds. Beans and lentils. Bacon. Hot dogs. Sausage. Dairy High-fat cheeses. Whole milk, chocolate milk, and beverages made with milk, such as milk shakes. Half-and-half. Cream. sour cream. Ice cream. Beverages Caffeinated beverages (such as coffee, tea, soda, or energy drinks). Alcoholic beverages. Fruit juices with pulp. Prune juice. Soft drinks sweetened with high-fructose corn syrup or sugar alcohols. High-calorie sports drinks. Fats and oils Butter. Cream sauces. Margarine. Salad oils. Plain salad dressings. Olives. Avocados. Mayonnaise. Sweets and desserts Sweet rolls, doughnuts, and sweet breads. Sugar-free desserts sweetened with sugar alcohols such as xylitol and sorbitol. Seasoning and other foods Honey. Hot sauce. Chili powder. Gravy. Cream-based or milk-based soups. Pancakes and waffles. Summary  When you have diarrhea, the foods you eat and your eating habits are very important.  Make sure you get at least 8-10 cups of fluid each day, or enough to keep your urine clear or pale yellow.  Eat bland foods and gradually reintroduce healthy, nutrient-rich foods as tolerated, or as told by your health care provider.  Avoid high-fiber, fried, greasy, or spicy foods. This information is not intended to replace advice given to you by your health care provider. Make sure you discuss any questions you have with your health care provider. Document Released: 01/12/2004 Document Revised: 10/19/2016 Document Reviewed:  10/19/2016 Elsevier Interactive Patient Education  2017 [employer]  
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
## 2017-04-09 ENCOUNTER — Other Ambulatory Visit: Payer: Self-pay | Admitting: Family Medicine

## 2017-04-09 ENCOUNTER — Ambulatory Visit: Payer: Self-pay | Admitting: Family Medicine

## 2017-04-09 DIAGNOSIS — R7989 Other specified abnormal findings of blood chemistry: Secondary | ICD-10-CM

## 2017-04-09 DIAGNOSIS — R700 Elevated erythrocyte sedimentation rate: Secondary | ICD-10-CM

## 2017-04-09 LAB — CBC WITH DIFFERENTIAL/PLATELET
Basophils Absolute: 0.1 10*3/uL (ref 0.0–0.1)
Basophils Relative: 1 % (ref 0.0–3.0)
Eosinophils Absolute: 0.1 10*3/uL (ref 0.0–0.7)
Eosinophils Relative: 0.9 % (ref 0.0–5.0)
HCT: 42.6 % (ref 39.0–52.0)
Hemoglobin: 14.6 g/dL (ref 13.0–17.0)
Lymphocytes Relative: 32.4 % (ref 12.0–46.0)
Lymphs Abs: 2.6 10*3/uL (ref 0.7–4.0)
MCHC: 34.3 g/dL (ref 30.0–36.0)
MCV: 95.7 fl (ref 78.0–100.0)
Monocytes Absolute: 1.2 10*3/uL — ABNORMAL HIGH (ref 0.1–1.0)
Monocytes Relative: 15.1 % — ABNORMAL HIGH (ref 3.0–12.0)
Neutro Abs: 4.1 10*3/uL (ref 1.4–7.7)
Neutrophils Relative %: 50.6 % (ref 43.0–77.0)
Platelets: 246 10*3/uL (ref 150.0–400.0)
RBC: 4.45 Mil/uL (ref 4.22–5.81)
RDW: 14.8 % (ref 11.5–15.5)
WBC: 8 10*3/uL (ref 4.0–10.5)

## 2017-04-09 LAB — COMPREHENSIVE METABOLIC PANEL
ALT: 24 U/L (ref 0–53)
AST: 30 U/L (ref 0–37)
Albumin: 4.3 g/dL (ref 3.5–5.2)
Alkaline Phosphatase: 58 U/L (ref 39–117)
BUN: 26 mg/dL — ABNORMAL HIGH (ref 6–23)
CO2: 28 mEq/L (ref 19–32)
Calcium: 9.7 mg/dL (ref 8.4–10.5)
Chloride: 98 mEq/L (ref 96–112)
Creatinine, Ser: 1.51 mg/dL — ABNORMAL HIGH (ref 0.40–1.50)
GFR: 48.85 mL/min — ABNORMAL LOW (ref >60.00)
Glucose, Bld: 115 mg/dL — ABNORMAL HIGH (ref 70–99)
Potassium: 4.1 mEq/L (ref 3.5–5.1)
Sodium: 136 mEq/L (ref 135–145)
Total Bilirubin: 0.5 mg/dL (ref 0.2–1.2)
Total Protein: 7.7 g/dL (ref 6.0–8.3)

## 2017-04-09 LAB — SEDIMENTATION RATE: Sed Rate: 25 mm/hr — ABNORMAL HIGH (ref 0–20)

## 2017-04-11 DIAGNOSIS — L409 Psoriasis, unspecified: Secondary | ICD-10-CM | POA: Diagnosis not present

## 2017-04-13 DIAGNOSIS — R197 Diarrhea, unspecified: Secondary | ICD-10-CM | POA: Insufficient documentation

## 2017-04-13 NOTE — Assessment & Plan Note (Signed)
Encouraged increased hydration and continue to monitor

## 2017-04-13 NOTE — Assessment & Plan Note (Signed)
Well controlled, no changes to meds. Encouraged heart healthy diet such as the DASH diet and exercise as tolerated.  °

## 2017-04-13 NOTE — Assessment & Plan Note (Signed)
On recent xray appeared to be enlarged to 4.5 cm prateoceed with ultrasound to evalu

## 2017-04-13 NOTE — Assessment & Plan Note (Signed)
hgba1c acceptable, minimize simple carbs. Increase exercise as tolerated. Continue current meds 

## 2017-04-13 NOTE — Assessment & Plan Note (Signed)
Several loose stool daily but no bloody or tarry stool. No recent fevers. Likely gastroenteritis. 24 hours of clear fluids, then progress to [employer] and on as tolerated. Report worsening symptoms.

## 2017-04-15 ENCOUNTER — Other Ambulatory Visit: Payer: Self-pay | Admitting: Family Medicine

## 2017-04-16 ENCOUNTER — Other Ambulatory Visit: Payer: Medicare Other

## 2017-04-17 ENCOUNTER — Encounter (HOSPITAL_BASED_OUTPATIENT_CLINIC_OR_DEPARTMENT_OTHER): Payer: Self-pay

## 2017-04-17 ENCOUNTER — Other Ambulatory Visit (INDEPENDENT_AMBULATORY_CARE_PROVIDER_SITE_OTHER): Payer: Medicare Other

## 2017-04-17 ENCOUNTER — Ambulatory Visit (HOSPITAL_BASED_OUTPATIENT_CLINIC_OR_DEPARTMENT_OTHER)
Admission: RE | Admit: 2017-04-17 | Discharge: 2017-04-17 | Disposition: A | Discharge status: Home / Self Care | Payer: Medicare Other | Source: Ambulatory Visit | Attending: Family Medicine | Admitting: Family Medicine

## 2017-04-17 DIAGNOSIS — I771 Stricture of artery: Secondary | ICD-10-CM | POA: Insufficient documentation

## 2017-04-17 DIAGNOSIS — K5730 Diverticulosis of large intestine without perforation or abscess without bleeding: Secondary | ICD-10-CM | POA: Diagnosis not present

## 2017-04-17 DIAGNOSIS — R700 Elevated erythrocyte sedimentation rate: Secondary | ICD-10-CM | POA: Diagnosis not present

## 2017-04-17 DIAGNOSIS — I714 Abdominal aortic aneurysm, without rupture, unspecified: Secondary | ICD-10-CM

## 2017-04-17 DIAGNOSIS — R7989 Other specified abnormal findings of blood chemistry: Secondary | ICD-10-CM

## 2017-04-17 DIAGNOSIS — N200 Calculus of kidney: Secondary | ICD-10-CM | POA: Diagnosis not present

## 2017-04-17 LAB — COMPREHENSIVE METABOLIC PANEL
ALT: 22 U/L (ref 0–53)
AST: 27 U/L (ref 0–37)
Albumin: 3.9 g/dL (ref 3.5–5.2)
Alkaline Phosphatase: 70 U/L (ref 39–117)
BUN: 18 mg/dL (ref 6–23)
CO2: 31 mEq/L (ref 19–32)
Calcium: 8.5 mg/dL (ref 8.4–10.5)
Chloride: 104 mEq/L (ref 96–112)
Creatinine, Ser: 1.15 mg/dL (ref 0.40–1.50)
GFR: 66.88 mL/min (ref >60.00)
Glucose, Bld: 99 mg/dL (ref 70–99)
Potassium: 4.5 mEq/L (ref 3.5–5.1)
Sodium: 142 mEq/L (ref 135–145)
Total Bilirubin: 0.4 mg/dL (ref 0.2–1.2)
Total Protein: 6.7 g/dL (ref 6.0–8.3)

## 2017-04-17 LAB — SEDIMENTATION RATE: Sed Rate: 8 mm/hr (ref 0–20)

## 2017-04-17 MED ORDER — IOPAMIDOL (ISOVUE-370) INJECTION 76%
100.0000 mL | Freq: Once | INTRAVENOUS | Status: AC | PRN
  Administered 2017-04-17: 80 mL via INTRAVENOUS

## 2017-04-18 ENCOUNTER — Encounter (HOSPITAL_COMMUNITY)
Admission: RE | Admit: 2017-04-18 | Discharge: 2017-04-18 | Disposition: A | Discharge status: Home / Self Care | Payer: Medicare Other | Source: Ambulatory Visit | Attending: Neurosurgery | Admitting: Neurosurgery

## 2017-04-18 ENCOUNTER — Encounter (HOSPITAL_COMMUNITY): Payer: Self-pay

## 2017-04-18 DIAGNOSIS — Z01812 Encounter for preprocedural laboratory examination: Secondary | ICD-10-CM | POA: Insufficient documentation

## 2017-04-18 DIAGNOSIS — E119 Type 2 diabetes mellitus without complications: Secondary | ICD-10-CM | POA: Insufficient documentation

## 2017-04-18 DIAGNOSIS — I10 Essential (primary) hypertension: Secondary | ICD-10-CM | POA: Insufficient documentation

## 2017-04-18 DIAGNOSIS — Z01810 Encounter for preprocedural cardiovascular examination: Secondary | ICD-10-CM | POA: Diagnosis not present

## 2017-04-18 LAB — CBC
HCT: 38.9 % — ABNORMAL LOW (ref 39.0–52.0)
Hemoglobin: 12.7 g/dL — ABNORMAL LOW (ref 13.0–17.0)
MCH: 31.6 pg (ref 26.0–34.0)
MCHC: 32.6 g/dL (ref 30.0–36.0)
MCV: 96.8 fL (ref 78.0–100.0)
Platelets: 252 10*3/uL (ref 150–400)
RBC: 4.02 MIL/uL — ABNORMAL LOW (ref 4.22–5.81)
RDW: 13.9 % (ref 11.5–15.5)
WBC: 7.7 10*3/uL (ref 4.0–10.5)

## 2017-04-18 LAB — SURGICAL PCR SCREEN
MRSA, PCR: NEGATIVE
Staphylococcus aureus: NEGATIVE

## 2017-04-18 LAB — GLUCOSE, CAPILLARY: Glucose-Capillary: 110 mg/dL — ABNORMAL HIGH (ref 65–99)

## 2017-04-18 NOTE — Pre-Procedure Instructions (Signed)
[name]  04/18/2017      [id number] [place], [place] - [address] [facility] [address] [address] [place] [address] Phone: [phone] Fax: [phone]  [place], [facility] [address] [place] [address] [place] [address] Phone: [phone] Fax: [phone]    Your procedure is scheduled on   Monday  04/29/17  Report to [facility] Admitting at 745 A.M.  Call this number if you have problems the morning of surgery:  [phone]   Remember:  Do not eat food or drink liquids after midnight.  Take these medicines the morning of surgery with A SIP OF WATER  -              AMLODIPINE (NORVASC)              CARVEDILOL (COREG)              CLONIDINE (CATAPRES)              GABAPENTIN              HYDROCODONE IF NEEDED              PROAIR INHALER  7 days prior to surgery STOP taking any Aspirin, Aleve, Naproxen, Ibuprofen, Motrin, Advil, Goody's, BC's, all herbal medications, fish oil, and all vitamins    How to Manage Your Diabetes Before and After Surgery  Why is it important to control my blood sugar before and after surgery? . Improving blood sugar levels before and after surgery helps healing and can limit problems. . A way of improving blood sugar control is eating a healthy diet by: o  Eating less sugar and carbohydrates o  Increasing activity/exercise o  Talking with your doctor about reaching your blood sugar goals . High blood sugars (greater than 180 mg/dL) can raise your risk of infections and slow your recovery, so you will need to focus on controlling your diabetes during the weeks before surgery. . Make sure that the doctor who takes care of your diabetes knows about your planned surgery including the date and location.  How do I manage my blood sugar before surgery? . Check your blood sugar at least 4 times a day, starting 2 days before surgery, to make sure  that the level is not too high or low. o Check your blood sugar the morning of your surgery when you wake up and every 2 hours until you get to the Short Stay unit. . If your blood sugar is less than 70 mg/dL, you will need to treat for low blood sugar: o Do not take insulin. o Treat a low blood sugar (less than 70 mg/dL) with  cup of clear juice (cranberry or apple), 4 glucose tablets, OR glucose gel. o Recheck blood sugar in 15 minutes after treatment (to make sure it is greater than 70 mg/dL). If your blood sugar is not greater than 70 mg/dL on recheck, call [phone] for further instructions. . Report your blood sugar to the short stay nurse when you get to Short Stay.  . If you are admitted to the hospital after surgery: o Your blood sugar will be checked by the staff and you will probably be given insulin after surgery (instead of oral diabetes medicines) to make sure you have good blood sugar levels. o The goal for blood sugar control after surgery is 80-180 mg/dL.  WHAT DO I DO ABOUT MY DIABETES MEDICATION?   [name] Do not take oral diabetes medicines (pills) the morning of surgery.  . THE NIGHT BEFORE SURGERY, take ___________ units of ___________insulin.       [name] HE MORNING OF SURGERY, take _____________ units of __________insulin.  . The day of surgery, do not take other diabetes injectables, including Byetta (exenatide), Bydureon (exenatide ER), Victoza (liraglutide), or Trulicity (dulaglutide).  . If your CBG is greater than 220 mg/dL, you may take  of your sliding scale (correction) dose of insulin.  Other Instructions:          Patient Signature:  Date:   Nurse Signature:  Date:   Reviewed and Endorsed by [facility] Patient Education Committee, August 2015  Do not wear jewelry, make-up or nail polish.  Do not wear lotions, powders, or perfumes, or deoderant.  Do not shave 48 hours prior to surgery.  Men may shave face and neck.  Do not  bring valuables to the hospital.  [facility] is not responsible for any belongings or valuables.  Contacts, dentures or bridgework may not be worn into surgery.  Leave your suitcase in the car.  After surgery it may be brought to your room.  For patients admitted to the hospital, discharge time will be determined by your treatment team.  Patients discharged the day of surgery will not be allowed to drive home.   Name and phone number of your driver:    Special instructions:  [facility] - Preparing for Surgery  Before surgery, you can play an important role.  Because skin is not sterile, your skin needs to be as free of germs as possible.  You can reduce the number of germs on you skin by washing with CHG (chlorahexidine gluconate) soap before surgery.  CHG is an antiseptic cleaner which kills germs and bonds with the skin to continue killing germs even after washing.  Please DO NOT use if you have an allergy to CHG or antibacterial soaps.  If your skin becomes reddened/irritated stop using the CHG and inform your nurse when you arrive at Short Stay.  Do not shave (including legs and underarms) for at least 48 hours prior to the first CHG shower.  You may shave your face.  Please follow these instructions carefully:   1.  Shower with CHG Soap the night before surgery and the                                morning of Surgery.  2.  If you choose to wash your hair, wash your hair first as usual with your       normal shampoo.  3.  After you shampoo, rinse your hair and body thoroughly to remove the                      Shampoo.  4.  Use CHG as you would any other liquid soap.  You can apply chg directly       to the skin and wash gently with scrungie or a clean washcloth.  5.  Apply the CHG Soap to your body ONLY FROM THE NECK DOWN.        Do not use on open wounds or open sores.  Avoid contact with your eyes,       ears, mouth and genitals (private parts).  Wash genitals (private parts)  with your normal soap.  6.  Wash thoroughly, paying special attention to the area where your surgery        will be performed.  7.  Thoroughly rinse your body with warm water from the neck down.  8.  DO NOT shower/wash with your normal soap after using and rinsing off       the CHG Soap.  9.  Pat yourself dry with a clean towel.            10.  Wear clean pajamas.            11.  Place clean sheets on your bed the night of your first shower and do not        sleep with pets.  Day of Surgery  Do not apply any lotions/deoderants the morning of surgery.  Please wear clean clothes to the hospital/surgery center.    Please read over the following fact sheets that you were given. MRSA Information and Surgical Site Infection Prevention

## 2017-04-25 ENCOUNTER — Other Ambulatory Visit: Payer: Self-pay | Admitting: Family Medicine

## 2017-04-26 MED ORDER — CEFAZOLIN SODIUM-DEXTROSE 2-4 GM/100ML-% IV SOLN
2.0000 g | INTRAVENOUS | Status: AC
  Administered 2017-04-29: 2 g via INTRAVENOUS
  Filled 2017-04-26: qty 100

## 2017-04-28 NOTE — Anesthesia Preprocedure Evaluation (Addendum)
Anesthesia Evaluation  Patient identified by MRN, date of birth, ID band Patient awake    Reviewed: Allergy & Precautions, H&P , NPO status , Patient's Chart, lab work & pertinent test results  Airway Mallampati: III  TM Distance: >3 FB Neck ROM: Full    Dental  (+) Upper Dentures, Edentulous Lower   Pulmonary shortness of breath and with exertion, neg pneumonia , COPD (mild, controlled),  COPD inhaler, former smoker,  Clinical Data: Pneumonia, cough.    CHEST - 2 VIEW    Comparison: 12/22/2009    Findings: There is hyperinflation of the lungs compatible with COPD.  Heart and mediastinal contours are within normal limits.  No focal opacities or effusions.  No acute bony abnormality.    IMPRESSION: No active disease. 12-30-09   Pulmonary exam normal breath sounds clear to auscultation       Cardiovascular Exercise Tolerance: Good hypertension, Pt. on home beta blockers and Pt. on medications + CAD and + Peripheral Vascular Disease  Normal cardiovascular exam+ dysrhythmias  Rhythm:Regular Rate:Normal  ECG: SR, 1st degree AV block, rate 70  ECHO:Left ventricle: The cavity size was normal. Systolic function was mildly reduced. The estimated ejection fraction was in the range of 45% to 50%. Images were inadequate for LV wall motion assessment event with definity contrast. - Aortic valve: Trileaflet; normal thickness, mildly calcified leaflets. - Mitral valve: There was trivial regurgitation.   Neuro/Psych CVA, No Residual Symptoms    GI/Hepatic Neg liver ROS,   Endo/Other  diabetes, Well Controlled, Type 2, Oral Hypoglycemic Agents  Renal/GU negative Renal ROS     Musculoskeletal  (+) Arthritis , Osteoarthritis,  Right hip to ankle numbness   Abdominal Normal abdominal exam  (+)   Peds  Hematology  (+) anemia ,   Anesthesia Other Findings Hyperlipidemia Obese BMI 34  Reproductive/Obstetrics                             Anesthesia Physical  Anesthesia Plan  ASA: III  Anesthesia Plan: General   Post-op Pain Management:    Induction: Intravenous  PONV Risk Score and Plan: 2 and Ondansetron, Dexamethasone, Propofol and Midazolam  Airway Management Planned: Oral ETT  Additional Equipment:   Intra-op Plan:   Post-operative Plan: Extubation in OR  Informed Consent: I have reviewed the patients History and Physical, chart, labs and discussed the procedure including the risks, benefits and alternatives for the proposed anesthesia with the patient or authorized representative who has indicated his/her understanding and acceptance.   Dental advisory given  Plan Discussed with: Surgeon and CRNA  Anesthesia Plan Comments:        Anesthesia Quick Evaluation

## 2017-04-29 ENCOUNTER — Encounter (HOSPITAL_COMMUNITY)
Admission: RE | Disposition: A | Discharge status: Home / Self Care | Payer: Self-pay | Source: Ambulatory Visit | Attending: Neurosurgery

## 2017-04-29 ENCOUNTER — Ambulatory Visit (HOSPITAL_COMMUNITY): Payer: Medicare Other | Admitting: Anesthesiology

## 2017-04-29 ENCOUNTER — Ambulatory Visit (HOSPITAL_COMMUNITY): Payer: Medicare Other

## 2017-04-29 ENCOUNTER — Observation Stay (HOSPITAL_COMMUNITY)
Admission: RE | Admit: 2017-04-29 | Discharge: 2017-04-30 | Disposition: A | Discharge status: Home / Self Care | Payer: Medicare Other | Source: Ambulatory Visit | Attending: Neurosurgery | Admitting: Neurosurgery

## 2017-04-29 ENCOUNTER — Ambulatory Visit: Payer: Medicare Other | Admitting: Emergency Medicine

## 2017-04-29 DIAGNOSIS — Z8673 Personal history of transient ischemic attack (TIA), and cerebral infarction without residual deficits: Secondary | ICD-10-CM | POA: Diagnosis not present

## 2017-04-29 DIAGNOSIS — M48062 Spinal stenosis, lumbar region with neurogenic claudication: Secondary | ICD-10-CM | POA: Diagnosis not present

## 2017-04-29 DIAGNOSIS — Z823 Family history of stroke: Secondary | ICD-10-CM | POA: Insufficient documentation

## 2017-04-29 DIAGNOSIS — Z833 Family history of diabetes mellitus: Secondary | ICD-10-CM | POA: Diagnosis not present

## 2017-04-29 DIAGNOSIS — I714 Abdominal aortic aneurysm, without rupture: Secondary | ICD-10-CM | POA: Diagnosis not present

## 2017-04-29 DIAGNOSIS — M4802 Spinal stenosis, cervical region: Secondary | ICD-10-CM | POA: Insufficient documentation

## 2017-04-29 DIAGNOSIS — E785 Hyperlipidemia, unspecified: Secondary | ICD-10-CM | POA: Insufficient documentation

## 2017-04-29 DIAGNOSIS — M7138 Other bursal cyst, other site: Secondary | ICD-10-CM

## 2017-04-29 DIAGNOSIS — Z87442 Personal history of urinary calculi: Secondary | ICD-10-CM | POA: Diagnosis not present

## 2017-04-29 DIAGNOSIS — Z87891 Personal history of nicotine dependence: Secondary | ICD-10-CM | POA: Insufficient documentation

## 2017-04-29 DIAGNOSIS — K219 Gastro-esophageal reflux disease without esophagitis: Secondary | ICD-10-CM | POA: Insufficient documentation

## 2017-04-29 DIAGNOSIS — Z9842 Cataract extraction status, left eye: Secondary | ICD-10-CM | POA: Diagnosis not present

## 2017-04-29 DIAGNOSIS — M5416 Radiculopathy, lumbar region: Secondary | ICD-10-CM | POA: Diagnosis not present

## 2017-04-29 DIAGNOSIS — J45909 Unspecified asthma, uncomplicated: Secondary | ICD-10-CM | POA: Diagnosis not present

## 2017-04-29 DIAGNOSIS — E1151 Type 2 diabetes mellitus with diabetic peripheral angiopathy without gangrene: Secondary | ICD-10-CM | POA: Diagnosis not present

## 2017-04-29 DIAGNOSIS — Z9841 Cataract extraction status, right eye: Secondary | ICD-10-CM | POA: Insufficient documentation

## 2017-04-29 DIAGNOSIS — Z419 Encounter for procedure for purposes other than remedying health state, unspecified: Secondary | ICD-10-CM

## 2017-04-29 DIAGNOSIS — M109 Gout, unspecified: Secondary | ICD-10-CM | POA: Insufficient documentation

## 2017-04-29 DIAGNOSIS — Z803 Family history of malignant neoplasm of breast: Secondary | ICD-10-CM | POA: Insufficient documentation

## 2017-04-29 DIAGNOSIS — D649 Anemia, unspecified: Secondary | ICD-10-CM | POA: Insufficient documentation

## 2017-04-29 DIAGNOSIS — G608 Other hereditary and idiopathic neuropathies: Secondary | ICD-10-CM | POA: Insufficient documentation

## 2017-04-29 DIAGNOSIS — Z7982 Long term (current) use of aspirin: Secondary | ICD-10-CM | POA: Insufficient documentation

## 2017-04-29 DIAGNOSIS — N289 Disorder of kidney and ureter, unspecified: Secondary | ICD-10-CM | POA: Diagnosis not present

## 2017-04-29 DIAGNOSIS — M19031 Primary osteoarthritis, right wrist: Secondary | ICD-10-CM | POA: Insufficient documentation

## 2017-04-29 DIAGNOSIS — M19072 Primary osteoarthritis, left ankle and foot: Secondary | ICD-10-CM | POA: Diagnosis not present

## 2017-04-29 DIAGNOSIS — I499 Cardiac arrhythmia, unspecified: Secondary | ICD-10-CM | POA: Diagnosis not present

## 2017-04-29 DIAGNOSIS — M4690 Unspecified inflammatory spondylopathy, site unspecified: Secondary | ICD-10-CM | POA: Insufficient documentation

## 2017-04-29 DIAGNOSIS — Z8249 Family history of ischemic heart disease and other diseases of the circulatory system: Secondary | ICD-10-CM | POA: Insufficient documentation

## 2017-04-29 DIAGNOSIS — Z8261 Family history of arthritis: Secondary | ICD-10-CM | POA: Insufficient documentation

## 2017-04-29 DIAGNOSIS — I10 Essential (primary) hypertension: Secondary | ICD-10-CM | POA: Insufficient documentation

## 2017-04-29 DIAGNOSIS — Z840 Family history of diseases of the skin and subcutaneous tissue: Secondary | ICD-10-CM | POA: Insufficient documentation

## 2017-04-29 DIAGNOSIS — Z6834 Body mass index (BMI) 34.0-34.9, adult: Secondary | ICD-10-CM | POA: Insufficient documentation

## 2017-04-29 DIAGNOSIS — Z801 Family history of malignant neoplasm of trachea, bronchus and lung: Secondary | ICD-10-CM | POA: Insufficient documentation

## 2017-04-29 DIAGNOSIS — E669 Obesity, unspecified: Secondary | ICD-10-CM | POA: Insufficient documentation

## 2017-04-29 DIAGNOSIS — Z7984 Long term (current) use of oral hypoglycemic drugs: Secondary | ICD-10-CM | POA: Insufficient documentation

## 2017-04-29 HISTORY — PX: LUMBAR LAMINECTOMY/DECOMPRESSION MICRODISCECTOMY: SHX5026

## 2017-04-29 HISTORY — DX: Other bursal cyst, other site: M71.38

## 2017-04-29 LAB — GLUCOSE, CAPILLARY
Glucose-Capillary: 155 mg/dL — ABNORMAL HIGH (ref 65–99)
Glucose-Capillary: 159 mg/dL — ABNORMAL HIGH (ref 65–99)
Glucose-Capillary: 163 mg/dL — ABNORMAL HIGH (ref 65–99)
Glucose-Capillary: 323 mg/dL — ABNORMAL HIGH (ref 65–99)
Glucose-Capillary: 317 mg/dL — ABNORMAL HIGH (ref 65–99)

## 2017-04-29 SURGERY — LUMBAR LAMINECTOMY/DECOMPRESSION MICRODISCECTOMY 1 LEVEL
Anesthesia: General

## 2017-04-29 MED ORDER — PHENYLEPHRINE HCL 10 MG/ML IJ SOLN
INTRAVENOUS | Status: DC | PRN
  Administered 2017-04-29: 40 ug/min via INTRAVENOUS

## 2017-04-29 MED ORDER — BUPIVACAINE-EPINEPHRINE (PF) 0.5% -1:200000 IJ SOLN
INTRAMUSCULAR | Status: AC
  Filled 2017-04-29: qty 30

## 2017-04-29 MED ORDER — EPHEDRINE 5 MG/ML INJ
INTRAVENOUS | Status: AC
  Filled 2017-04-29: qty 10

## 2017-04-29 MED ORDER — METFORMIN HCL 500 MG PO TABS
1000.0000 mg | ORAL_TABLET | Freq: Every day | ORAL | Status: DC
  Filled 2017-04-29: qty 2

## 2017-04-29 MED ORDER — ACETAMINOPHEN 325 MG PO TABS: 650.0000 mg | ORAL_TABLET | ORAL | Status: DC | PRN

## 2017-04-29 MED ORDER — BACITRACIN 50000 UNITS IM SOLR
INTRAMUSCULAR | Status: DC | PRN
  Administered 2017-04-29: 09:00:00

## 2017-04-29 MED ORDER — CYCLOBENZAPRINE HCL 10 MG PO TABS
10.0000 mg | ORAL_TABLET | Freq: Three times a day (TID) | ORAL | Status: DC | PRN
  Administered 2017-04-29 – 2017-04-30 (×2): 10 mg via ORAL
  Filled 2017-04-29 (×2): qty 1

## 2017-04-29 MED ORDER — SUGAMMADEX SODIUM 200 MG/2ML IV SOLN
INTRAVENOUS | Status: DC | PRN
Start: 2017-04-29 — End: 2017-04-29
  Administered 2017-04-29: 150 mg via INTRAVENOUS

## 2017-04-29 MED ORDER — CLONIDINE HCL 0.1 MG PO TABS
0.1000 mg | ORAL_TABLET | Freq: Two times a day (BID) | ORAL | Status: DC
  Filled 2017-04-29 (×2): qty 1

## 2017-04-29 MED ORDER — DEXAMETHASONE SODIUM PHOSPHATE 10 MG/ML IJ SOLN
INTRAMUSCULAR | Status: AC
  Filled 2017-04-29: qty 1

## 2017-04-29 MED ORDER — ONDANSETRON HCL 4 MG/2ML IJ SOLN
INTRAMUSCULAR | Status: DC | PRN
  Administered 2017-04-29: 4 mg via INTRAVENOUS

## 2017-04-29 MED ORDER — MAGNESIUM 500 MG PO TABS: 500.0000 mg | ORAL_TABLET | Freq: Every day | ORAL | Status: DC

## 2017-04-29 MED ORDER — ATORVASTATIN CALCIUM 20 MG PO TABS
80.0000 mg | ORAL_TABLET | Freq: Every day | ORAL | Status: DC
  Administered 2017-04-29: 80 mg via ORAL
  Filled 2017-04-29: qty 4

## 2017-04-29 MED ORDER — CHLORHEXIDINE GLUCONATE CLOTH 2 % EX PADS: 6.0000 | MEDICATED_PAD | Freq: Once | CUTANEOUS | Status: DC

## 2017-04-29 MED ORDER — DEXAMETHASONE SODIUM PHOSPHATE 10 MG/ML IJ SOLN
INTRAMUSCULAR | Status: DC | PRN
  Administered 2017-04-29: 10 mg via INTRAVENOUS

## 2017-04-29 MED ORDER — VITAMIN D 1000 UNITS PO TABS
1000.0000 [IU] | ORAL_TABLET | Freq: Every day | ORAL | Status: DC
  Filled 2017-04-29: qty 1

## 2017-04-29 MED ORDER — LIDOCAINE HCL (CARDIAC) 20 MG/ML IV SOLN
INTRAVENOUS | Status: DC | PRN
  Administered 2017-04-29: 60 mg via INTRAVENOUS

## 2017-04-29 MED ORDER — MIDAZOLAM HCL 2 MG/2ML IJ SOLN
INTRAMUSCULAR | Status: AC
  Filled 2017-04-29: qty 2

## 2017-04-29 MED ORDER — ONDANSETRON HCL 4 MG PO TABS: 4.0000 mg | ORAL_TABLET | Freq: Four times a day (QID) | ORAL | Status: DC | PRN

## 2017-04-29 MED ORDER — CEFAZOLIN SODIUM-DEXTROSE 2-4 GM/100ML-% IV SOLN
2.0000 g | Freq: Three times a day (TID) | INTRAVENOUS | Status: AC
  Administered 2017-04-29 – 2017-04-30 (×2): 2 g via INTRAVENOUS
  Filled 2017-04-29 (×2): qty 100

## 2017-04-29 MED ORDER — HYDROMORPHONE HCL 1 MG/ML IJ SOLN
INTRAMUSCULAR | Status: AC
  Filled 2017-04-29: qty 0.5

## 2017-04-29 MED ORDER — ROCURONIUM BROMIDE 100 MG/10ML IV SOLN
INTRAVENOUS | Status: DC | PRN
  Administered 2017-04-29 (×2): 10 mg via INTRAVENOUS
  Administered 2017-04-29: 50 mg via INTRAVENOUS

## 2017-04-29 MED ORDER — GLIMEPIRIDE 2 MG PO TABS
2.0000 mg | ORAL_TABLET | Freq: Three times a day (TID) | ORAL | Status: DC
Start: 2017-04-29 — End: 2017-04-30
  Administered 2017-04-29: 2 mg via ORAL
  Filled 2017-04-29 (×2): qty 1

## 2017-04-29 MED ORDER — FENTANYL CITRATE (PF) 100 MCG/2ML IJ SOLN
INTRAMUSCULAR | Status: DC | PRN
  Administered 2017-04-29: 200 ug via INTRAVENOUS
  Administered 2017-04-29: 50 ug via INTRAVENOUS

## 2017-04-29 MED ORDER — ONDANSETRON HCL 4 MG/2ML IJ SOLN
INTRAMUSCULAR | Status: AC
  Filled 2017-04-29: qty 2

## 2017-04-29 MED ORDER — MIDAZOLAM HCL 5 MG/5ML IJ SOLN
INTRAMUSCULAR | Status: DC | PRN
  Administered 2017-04-29: 2 mg via INTRAVENOUS

## 2017-04-29 MED ORDER — CARVEDILOL 6.25 MG PO TABS
25.0000 mg | ORAL_TABLET | Freq: Two times a day (BID) | ORAL | Status: DC
  Administered 2017-04-29: 12.5 mg via ORAL
  Filled 2017-04-29 (×2): qty 4

## 2017-04-29 MED ORDER — ROCURONIUM BROMIDE 10 MG/ML (PF) SYRINGE
PREFILLED_SYRINGE | INTRAVENOUS | Status: AC
  Filled 2017-04-29: qty 5

## 2017-04-29 MED ORDER — LATANOPROST 0.005 % OP SOLN
1.0000 [drp] | Freq: Every day | OPHTHALMIC | Status: DC
  Administered 2017-04-29: 1 [drp] via OPHTHALMIC
  Filled 2017-04-29: qty 2.5

## 2017-04-29 MED ORDER — BACITRACIN ZINC 500 UNIT/GM EX OINT
TOPICAL_OINTMENT | CUTANEOUS | Status: AC
  Filled 2017-04-29: qty 28.35

## 2017-04-29 MED ORDER — MORPHINE SULFATE (PF) 4 MG/ML IV SOLN
4.0000 mg | INTRAVENOUS | Status: DC | PRN
  Administered 2017-04-29: 4 mg via INTRAVENOUS
  Filled 2017-04-29: qty 1

## 2017-04-29 MED ORDER — PHENYLEPHRINE 40 MCG/ML (10ML) SYRINGE FOR IV PUSH (FOR BLOOD PRESSURE SUPPORT)
PREFILLED_SYRINGE | INTRAVENOUS | Status: AC
  Filled 2017-04-29: qty 10

## 2017-04-29 MED ORDER — BISACODYL 10 MG RE SUPP: 10.0000 mg | Freq: Every day | RECTAL | Status: DC | PRN

## 2017-04-29 MED ORDER — HYDROMORPHONE HCL 1 MG/ML IJ SOLN
0.2500 mg | INTRAMUSCULAR | Status: DC | PRN
  Administered 2017-04-29: 0.5 mg via INTRAVENOUS

## 2017-04-29 MED ORDER — HYDROCODONE-ACETAMINOPHEN 10-325 MG PO TABS
1.0000 | ORAL_TABLET | ORAL | Status: DC | PRN
  Administered 2017-04-29 – 2017-04-30 (×4): 1 via ORAL
  Filled 2017-04-29 (×4): qty 1

## 2017-04-29 MED ORDER — PHENOL 1.4 % MT LIQD: 1.0000 | OROMUCOSAL | Status: DC | PRN

## 2017-04-29 MED ORDER — ONDANSETRON HCL 4 MG/2ML IJ SOLN: 4.0000 mg | Freq: Four times a day (QID) | INTRAMUSCULAR | Status: DC | PRN

## 2017-04-29 MED ORDER — BACITRACIN ZINC 500 UNIT/GM EX OINT
TOPICAL_OINTMENT | CUTANEOUS | Status: DC | PRN
  Administered 2017-04-29: 1 via TOPICAL

## 2017-04-29 MED ORDER — OXYCODONE HCL 5 MG PO TABS: 5.0000 mg | ORAL_TABLET | ORAL | Status: DC | PRN

## 2017-04-29 MED ORDER — MAGNESIUM GLUCONATE 500 MG PO TABS
500.0000 mg | ORAL_TABLET | Freq: Every day | ORAL | Status: DC
  Administered 2017-04-29: 500 mg via ORAL
  Filled 2017-04-29 (×2): qty 1

## 2017-04-29 MED ORDER — ACETAMINOPHEN 650 MG RE SUPP: 650.0000 mg | RECTAL | Status: DC | PRN

## 2017-04-29 MED ORDER — INSULIN ASPART 100 UNIT/ML ~~LOC~~ SOLN
0.0000 [IU] | Freq: Three times a day (TID) | SUBCUTANEOUS | Status: DC
  Administered 2017-04-29: 10 [IU] via SUBCUTANEOUS

## 2017-04-29 MED ORDER — OXYCODONE HCL 5 MG PO TABS
ORAL_TABLET | ORAL | Status: AC
  Filled 2017-04-29: qty 1

## 2017-04-29 MED ORDER — PROPOFOL 10 MG/ML IV BOLUS
INTRAVENOUS | Status: AC
  Filled 2017-04-29: qty 20

## 2017-04-29 MED ORDER — AMLODIPINE BESYLATE 5 MG PO TABS
5.0000 mg | ORAL_TABLET | Freq: Every day | ORAL | Status: DC
  Filled 2017-04-29: qty 1

## 2017-04-29 MED ORDER — THROMBIN 5000 UNITS EX SOLR
CUTANEOUS | Status: AC
  Filled 2017-04-29: qty 15000

## 2017-04-29 MED ORDER — LIDOCAINE 2% (20 MG/ML) 5 ML SYRINGE
INTRAMUSCULAR | Status: AC
  Filled 2017-04-29: qty 5

## 2017-04-29 MED ORDER — PROPOFOL 10 MG/ML IV BOLUS
INTRAVENOUS | Status: DC | PRN
  Administered 2017-04-29: 150 mg via INTRAVENOUS

## 2017-04-29 MED ORDER — 0.9 % SODIUM CHLORIDE (POUR BTL) OPTIME
TOPICAL | Status: DC | PRN
  Administered 2017-04-29: 1000 mL

## 2017-04-29 MED ORDER — ALBUTEROL SULFATE (2.5 MG/3ML) 0.083% IN NEBU: 3.0000 mL | INHALATION_SOLUTION | Freq: Four times a day (QID) | RESPIRATORY_TRACT | Status: DC | PRN

## 2017-04-29 MED ORDER — INSULIN ASPART 100 UNIT/ML ~~LOC~~ SOLN: 0.0000 [IU] | SUBCUTANEOUS | Status: DC

## 2017-04-29 MED ORDER — GABAPENTIN 300 MG PO CAPS
600.0000 mg | ORAL_CAPSULE | Freq: Two times a day (BID) | ORAL | Status: DC
  Administered 2017-04-29: 600 mg via ORAL
  Filled 2017-04-29 (×2): qty 2

## 2017-04-29 MED ORDER — HYDROCHLOROTHIAZIDE 12.5 MG PO CAPS
12.5000 mg | ORAL_CAPSULE | Freq: Every day | ORAL | Status: DC
  Filled 2017-04-29: qty 1

## 2017-04-29 MED ORDER — THROMBIN 5000 UNITS EX SOLR
CUTANEOUS | Status: DC | PRN
Start: 2017-04-29 — End: 2017-04-29
  Administered 2017-04-29 (×2): 5000 [IU] via TOPICAL

## 2017-04-29 MED ORDER — ONE-DAILY MULTI VITAMINS PO TABS: 1.0000 | ORAL_TABLET | Freq: Every day | ORAL | Status: DC

## 2017-04-29 MED ORDER — SODIUM CHLORIDE 0.9% FLUSH: 3.0000 mL | INTRAVENOUS | Status: DC | PRN

## 2017-04-29 MED ORDER — BUPIVACAINE-EPINEPHRINE (PF) 0.5% -1:200000 IJ SOLN
INTRAMUSCULAR | Status: DC | PRN
  Administered 2017-04-29 (×2): 10 mL via PERINEURAL

## 2017-04-29 MED ORDER — METFORMIN HCL 500 MG PO TABS
500.0000 mg | ORAL_TABLET | Freq: Two times a day (BID) | ORAL | Status: DC
Start: 2017-04-29 — End: 2017-04-30
  Administered 2017-04-29: 500 mg via ORAL
  Filled 2017-04-29: qty 1

## 2017-04-29 MED ORDER — PHENYLEPHRINE HCL 10 MG/ML IJ SOLN
INTRAMUSCULAR | Status: DC | PRN
  Administered 2017-04-29 (×2): 80 ug via INTRAVENOUS

## 2017-04-29 MED ORDER — SUCCINYLCHOLINE CHLORIDE 200 MG/10ML IV SOSY
PREFILLED_SYRINGE | INTRAVENOUS | Status: AC
  Filled 2017-04-29: qty 10

## 2017-04-29 MED ORDER — OXYCODONE HCL 5 MG/5ML PO SOLN: 5.0000 mg | Freq: Once | ORAL | Status: AC | PRN

## 2017-04-29 MED ORDER — MENTHOL 3 MG MT LOZG: 1.0000 | LOZENGE | OROMUCOSAL | Status: DC | PRN

## 2017-04-29 MED ORDER — LACTATED RINGERS IV SOLN
INTRAVENOUS | Status: DC
  Administered 2017-04-29 (×2): via INTRAVENOUS

## 2017-04-29 MED ORDER — LOSARTAN POTASSIUM 50 MG PO TABS
50.0000 mg | ORAL_TABLET | Freq: Every day | ORAL | Status: DC
  Filled 2017-04-29: qty 1

## 2017-04-29 MED ORDER — ADULT MULTIVITAMIN W/MINERALS CH
1.0000 | ORAL_TABLET | Freq: Every day | ORAL | Status: DC
  Filled 2017-04-29: qty 1

## 2017-04-29 MED ORDER — VANCOMYCIN HCL 1000 MG IV SOLR
INTRAVENOUS | Status: AC
  Filled 2017-04-29: qty 1000

## 2017-04-29 MED ORDER — THROMBIN 5000 UNITS EX SOLR
OROMUCOSAL | Status: DC | PRN
  Administered 2017-04-29: 09:00:00 via TOPICAL

## 2017-04-29 MED ORDER — DOCUSATE SODIUM 100 MG PO CAPS
100.0000 mg | ORAL_CAPSULE | Freq: Two times a day (BID) | ORAL | Status: DC
  Administered 2017-04-29: 100 mg via ORAL
  Filled 2017-04-29 (×2): qty 1

## 2017-04-29 MED ORDER — SODIUM CHLORIDE 0.9% FLUSH
3.0000 mL | Freq: Two times a day (BID) | INTRAVENOUS | Status: DC
  Administered 2017-04-29: 3 mL via INTRAVENOUS

## 2017-04-29 MED ORDER — PROMETHAZINE HCL 25 MG/ML IJ SOLN: 6.2500 mg | INTRAMUSCULAR | Status: DC | PRN

## 2017-04-29 MED ORDER — SUGAMMADEX SODIUM 200 MG/2ML IV SOLN
INTRAVENOUS | Status: AC
  Filled 2017-04-29: qty 2

## 2017-04-29 MED ORDER — OXYCODONE HCL 5 MG PO TABS
5.0000 mg | ORAL_TABLET | Freq: Once | ORAL | Status: AC | PRN
  Administered 2017-04-29: 5 mg via ORAL

## 2017-04-29 MED ORDER — FENTANYL CITRATE (PF) 250 MCG/5ML IJ SOLN
INTRAMUSCULAR | Status: AC
  Filled 2017-04-29: qty 5

## 2017-04-29 SURGICAL SUPPLY — 53 items
APL SKNCLS STERI-STRIP NONHPOA (GAUZE/BANDAGES/DRESSINGS) ×1
BAG DECANTER FOR FLEXI CONT (MISCELLANEOUS) ×2 IMPLANT
BENZOIN TINCTURE PRP APPL 2/3 (GAUZE/BANDAGES/DRESSINGS) ×2 IMPLANT
BLADE CLIPPER SURG (BLADE) IMPLANT
BUR MATCHSTICK NEURO 3.0 LAGG (BURR) ×2 IMPLANT
BUR PRECISION FLUTE 6.0 (BURR) ×2 IMPLANT
CANISTER SUCT 3000ML PPV (MISCELLANEOUS) ×2 IMPLANT
CARTRIDGE OIL MAESTRO DRILL (MISCELLANEOUS) ×1 IMPLANT
DIFFUSER DRILL AIR PNEUMATIC (MISCELLANEOUS) ×2 IMPLANT
DRAPE LAPAROTOMY 100X72X124 (DRAPES) ×2 IMPLANT
DRAPE MICROSCOPE LEICA (MISCELLANEOUS) ×2 IMPLANT
DRAPE POUCH INSTRU U-SHP 10X18 (DRAPES) ×2 IMPLANT
DRAPE SURG 17X23 STRL (DRAPES) ×8 IMPLANT
ELECT BLADE 4.0 EZ CLEAN MEGAD (MISCELLANEOUS) ×2
ELECT REM PT RETURN 9FT ADLT (ELECTROSURGICAL) ×2
ELECTRODE BLDE 4.0 EZ CLN MEGD (MISCELLANEOUS) ×1 IMPLANT
ELECTRODE REM PT RTRN 9FT ADLT (ELECTROSURGICAL) ×1 IMPLANT
GAUZE SPONGE 4X4 12PLY STRL (GAUZE/BANDAGES/DRESSINGS) ×2 IMPLANT
GAUZE SPONGE 4X4 16PLY XRAY LF (GAUZE/BANDAGES/DRESSINGS) IMPLANT
GLOVE BIO SURGEON STRL SZ8 (GLOVE) ×2 IMPLANT
GLOVE BIO SURGEON STRL SZ8.5 (GLOVE) ×2 IMPLANT
GLOVE BIOGEL PI IND STRL 7.5 (GLOVE) IMPLANT
GLOVE BIOGEL PI INDICATOR 7.5 (GLOVE) ×1
GLOVE ECLIPSE 9.0 STRL (GLOVE) ×1 IMPLANT
GLOVE EXAM NITRILE LRG STRL (GLOVE) IMPLANT
GLOVE EXAM NITRILE XL STR (GLOVE) IMPLANT
GLOVE EXAM NITRILE XS STR PU (GLOVE) IMPLANT
GOWN STRL REUS W/ TWL LRG LVL3 (GOWN DISPOSABLE) IMPLANT
GOWN STRL REUS W/ TWL XL LVL3 (GOWN DISPOSABLE) ×1 IMPLANT
GOWN STRL REUS W/TWL 2XL LVL3 (GOWN DISPOSABLE) IMPLANT
GOWN STRL REUS W/TWL LRG LVL3 (GOWN DISPOSABLE)
GOWN STRL REUS W/TWL XL LVL3 (GOWN DISPOSABLE) ×4
HEMOSTAT POWDER SURGIFOAM 1G (HEMOSTASIS) ×1 IMPLANT
KIT BASIN OR (CUSTOM PROCEDURE TRAY) ×2 IMPLANT
KIT ROOM TURNOVER OR (KITS) ×2 IMPLANT
NDL HYPO 21X1.5 SAFETY (NEEDLE) IMPLANT
NEEDLE HYPO 21X1.5 SAFETY (NEEDLE) IMPLANT
NEEDLE HYPO 22GX1.5 SAFETY (NEEDLE) ×2 IMPLANT
NS IRRIG 1000ML POUR BTL (IV SOLUTION) ×2 IMPLANT
OIL CARTRIDGE MAESTRO DRILL (MISCELLANEOUS) ×2
PACK LAMINECTOMY NEURO (CUSTOM PROCEDURE TRAY) ×2 IMPLANT
PAD ARMBOARD 7.5X6 YLW CONV (MISCELLANEOUS) ×6 IMPLANT
PATTIES SURGICAL .5 X1 (DISPOSABLE) IMPLANT
RUBBERBAND STERILE (MISCELLANEOUS) ×4 IMPLANT
SPONGE SURGIFOAM ABS GEL SZ50 (HEMOSTASIS) ×2 IMPLANT
STRIP CLOSURE SKIN 1/2X4 (GAUZE/BANDAGES/DRESSINGS) ×2 IMPLANT
SUT VIC AB 1 CT1 18XBRD ANBCTR (SUTURE) ×1 IMPLANT
SUT VIC AB 1 CT1 8-18 (SUTURE) ×4
SUT VIC AB 2-0 CP2 18 (SUTURE) ×2 IMPLANT
TAPE CLOTH SURG 4X10 WHT LF (GAUZE/BANDAGES/DRESSINGS) ×1 IMPLANT
TOWEL GREEN STERILE (TOWEL DISPOSABLE) ×2 IMPLANT
TOWEL GREEN STERILE FF (TOWEL DISPOSABLE) ×2 IMPLANT
WATER STERILE IRR 1000ML POUR (IV SOLUTION) ×2 IMPLANT

## 2017-04-29 NOTE — Evaluation (Signed)
Physical Therapy Evaluation Patient Details Name: [name] MRN: [id number] DOB: [birth year] Today's Date: 04/29/2017   History of Present Illness  Pt is 71 y/o male s/p L2-3 laminectomy with resection of synovial cyst.   PMH includes AAA, asthma, DM, chronic brochitis, PVD, CVA with no residual deficits, peripheral neuropathy, and s/p lumbar laminectomy and carotid endarterectomy.   Clinical Impression  Patient is s/p above surgery resulting in the deficits listed below (see PT Problem List). PTA, pt was mostly independent with ambulation, however, did use a cane and RW on his "bad days." Upon evaluation, pt limited by post op pain, L hip muscle fatigue, decreased balance and decreased strength. Required multiple standing rests secondary to muscle fatigue. Min guard to min A for safety with mobility. Educated to use RW, especially with longer distances, to ensure safety at home. Patient will benefit from skilled PT to increase their independence and safety with mobility (while adhering to their precautions) to allow discharge to the venue listed below. Will continue to follow acutely to maximize functional mobility independence.      Follow Up Recommendations No PT follow up;Supervision for mobility/OOB    Equipment Recommendations  None recommended by PT    Recommendations for Other Services       Precautions / Restrictions Precautions Precautions: Back Precaution Booklet Issued: Yes (comment) Precaution Comments: Reviewed back precaution handout with pt.  Restrictions Weight Bearing Restrictions: No      Mobility  Bed Mobility Overal bed mobility: Needs Assistance Bed Mobility: Rolling;Sidelying to Sit;Sit to Sidelying Rolling: Min guard Sidelying to sit: Min assist     Sit to sidelying: Min assist General bed mobility comments: Min guard to min A to ensure proper log roll technique. verbal cues to maintain precautions.   Transfers Overall transfer level: Needs  assistance Equipment used: None Transfers: Sit to/from Stand Sit to Stand: Min guard         General transfer comment: Min guard for safety. Required extended time to complete secondary to pain.   Ambulation/Gait Ambulation/Gait assistance: Min guard Ambulation Distance (Feet): 150 Feet Assistive device: None Gait Pattern/deviations: Step-through pattern;Decreased stride length Gait velocity: Decreased Gait velocity interpretation: Below normal speed for age/gender General Gait Details: Slow, guarded gait. Mild unsteadiness, but no LOB noted. Required multiple standing rest breaks secondary to L hip muscle fatigue. Pt reports the LLE feels "tired." Distance limited secondary to pain.   Stairs            Wheelchair Mobility    Modified Rankin (Stroke Patients Only)       Balance Overall balance assessment: Needs assistance Sitting-balance support: No upper extremity supported;Feet supported Sitting balance-Leahy Scale: Good     Standing balance support: No upper extremity supported;During functional activity Standing balance-Leahy Scale: Fair                               Pertinent Vitals/Pain Pain Assessment: 0-10 Pain Score: 10-Worst pain ever Pain Location: back  Pain Descriptors / Indicators: Sore;Operative site guarding;Grimacing Pain Intervention(s): Limited activity within patient's tolerance;Monitored during session;Repositioned    Home Living Family/patient expects to be discharged to:: Private residence Living Arrangements: Spouse/significant other Available Help at Discharge: Family;Available 24 hours/day Type of Home: House Home Access: Ramped entrance     Home Layout: One level Home Equipment: Walker - 2 wheels;Cane - single point;Bedside commode;Shower seat - built in;Other (comment) (long handled sponge )  Prior Function Level of Independence: Independent;Independent with assistive device(s)         Comments:  Occasionally used RW or cane on his "bad days"      Hand Dominance   Dominant Hand: Right    Extremity/Trunk Assessment   Upper Extremity Assessment Upper Extremity Assessment: Overall WFL for tasks assessed    Lower Extremity Assessment Lower Extremity Assessment: Generalized weakness;LLE deficits/detail LLE Deficits / Details: Reporting muscle fatigue in L hip musculature during ambulation.     Cervical / Trunk Assessment Cervical / Trunk Assessment: Other exceptions Cervical / Trunk Exceptions: s/p surgery   Communication   Communication: No difficulties  Cognition Arousal/Alertness: Awake/alert Behavior During Therapy: WFL for tasks assessed/performed Overall Cognitive Status: Within Functional Limits for tasks assessed                                        General Comments General comments (skin integrity, edema, etc.): Educated about generalized walking program to perform at home. Educated to continue use of RW to ensure safety with ambulation, especially with longer distances. Educated about appropriate car transfer technique.     Exercises     Assessment/Plan    PT Assessment Patient needs continued PT services  PT Problem List Decreased strength;Decreased range of motion;Decreased activity tolerance;Decreased balance;Decreased mobility;Decreased knowledge of precautions;Pain       PT Treatment Interventions DME instruction;Gait training;Functional mobility training;Therapeutic activities;Therapeutic exercise;Neuromuscular re-education;Balance training;Patient/family education    PT Goals (Current goals can be found in the Care Plan section)  Acute Rehab PT Goals Patient Stated Goal: to decrease soreness PT Goal Formulation: With patient Time For Goal Achievement: 05/06/17 Potential to Achieve Goals: Good    Frequency Min 5X/week   Barriers to discharge        Co-evaluation               AM-PAC PT "6 Clicks" Daily Activity   Outcome Measure Difficulty turning over in bed (including adjusting bedclothes, sheets and blankets)?: Total Difficulty moving from lying on back to sitting on the side of the bed? : Total Difficulty sitting down on and standing up from a chair with arms (e.g., wheelchair, bedside commode, etc,.)?: Total Help needed moving to and from a bed to chair (including a wheelchair)?: A Little Help needed walking in hospital room?: A Little Help needed climbing 3-5 steps with a railing? : A Little 6 Click Score: 12    End of Session Equipment Utilized During Treatment: Gait belt Activity Tolerance: Patient limited by pain Patient left: in bed;with call bell/phone within reach Nurse Communication: Mobility status PT Visit Diagnosis: Other abnormalities of gait and mobility (R26.89);Pain Pain - part of body:  (back )    Time: 2774-1287 PT Time Calculation (min) (ACUTE ONLY): 18 min   Charges:   PT Evaluation $PT Eval Low Complexity: 1 Procedure PT Treatments $Gait Training: 8-22 mins   PT G Codes:   PT G-Codes **NOT FOR INPATIENT CLASS** Functional Assessment Tool Used: AM-PAC 6 Clicks Basic Mobility Functional Limitation: Mobility: Walking and moving around Mobility: Walking and Moving Around Current Status (O6767): At least 60 percent but less than 80 percent impaired, limited or restricted Mobility: Walking and Moving Around Goal Status [phone]): At least 1 percent but less than 20 percent impaired, limited or restricted    Leighton Ruff, PT, DPT  Acute Rehabilitation Services  Pager: [place]  Levorn Oleski 04/29/2017, 3:15 PM
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
## 2017-04-29 NOTE — Anesthesia Postprocedure Evaluation (Signed)
Anesthesia Post Note  Patient: [name]  Procedure(s) Performed: Procedure(s) (LRB): LAMINECTOMY AND FORAMINOTOMY LUMBAR TWO- LUMBAR THREE (N/A)     Patient location during evaluation: PACU Anesthesia Type: General Level of consciousness: awake and alert Pain management: pain level controlled Vital Signs Assessment: post-procedure vital signs reviewed and stable Respiratory status: spontaneous breathing, nonlabored ventilation, respiratory function stable and patient connected to nasal cannula oxygen Cardiovascular status: blood pressure returned to baseline and stable Postop Assessment: no signs of nausea or vomiting Anesthetic complications: no    Last Vitals:  Vitals:   04/29/17 1307 04/29/17 1523  BP: 125/66 130/84  Pulse: 74 85  Resp: 18 16  Temp: 36.5 C 36.6 C    Last Pain:  Vitals:   04/29/17 1421  TempSrc:   PainSc: 10-Worst pain ever                 Ryan P Ellender

## 2017-04-29 NOTE — Progress Notes (Signed)
Patient ID: [name], male   DOB: May 06, 1946, 71 y.o.   MRN: [id number] Subjective:  The patient is alert and pleasant. He looks well.  Objective: Vital signs in last 24 hours: Temp:  [97.7 F (36.5 C)-98.4 F (36.9 C)] 97.7 F (36.5 C) (06/25 1307) Pulse Rate:  [71-80] 74 (06/25 1307) Resp:  [12-19] 18 (06/25 1307) BP: (118-146)/(58-67) 125/66 (06/25 1307) SpO2:  [93 %-98 %] 96 % (06/25 1307) Weight:  [105.5 kg (232 lb 9.6 oz)] 105.5 kg (232 lb 9.6 oz) (06/25 0801)  Intake/Output from previous day: No intake/output data recorded. Intake/Output this shift: Total I/O In: 1740 [P.O.:240; I.V.:1500] Out: 57 [Blood:85]  Physical exam the patient is alert and pleasant. He is moving his lower extremities well.  Lab Results: No results for input(s): WBC, HGB, HCT, PLT in the last 72 hours. BMET No results for input(s): NA, K, CL, CO2, GLUCOSE, BUN, CREATININE, CALCIUM in the last 72 hours.  Studies/Results: Dg Lumbar Spine 1 View  Result Date: 04/29/2017 CLINICAL DATA:  L2-L3 foraminotomy and laminectomy. EXAM: Operative LUMBAR SPINE - 1 VIEW COMPARISON:  MRI lumbar spine 03/03/2017. Lumbar spine x-rays 02/23/2017. FINDINGS: Single cross-table lateral image obtained 10:31 a.m. and submitted for interpretation postoperatively demonstrates the localizer device projected toward the L2-3 disc space. IMPRESSION: L2-3 localized intraoperatively. Electronically Signed   By: Evangeline Dakin M.D.   On: 04/29/2017 11:44    Assessment/Plan: The patient is doing well. He may go home later on today. I spoke with his wife.  LOS: 0 days     [name] D 04/29/2017, 1:14 PM

## 2017-04-29 NOTE — Transfer of Care (Signed)
Immediate Anesthesia Transfer of Care Note  Patient: [name]  Procedure(s) Performed: Procedure(s): LAMINECTOMY AND FORAMINOTOMY LUMBAR TWO- LUMBAR THREE (N/A)  Patient Location: PACU  Anesthesia Type:General  Level of Consciousness: awake, alert , oriented and patient cooperative  Airway & Oxygen Therapy: Patient Spontanous Breathing and Patient connected to nasal cannula oxygen  Post-op Assessment: Report given to RN, Post -op Vital signs reviewed and stable, Patient moving all extremities and Patient moving all extremities X 4  Post vital signs: Reviewed and stable  Last Vitals:  Vitals:   04/29/17 0801  BP: (!) 146/66  Pulse: 80  Temp: 36.9 C    Last Pain:  Vitals:   04/29/17 0801  TempSrc: Oral         Complications: No apparent anesthesia complications

## 2017-04-29 NOTE — Op Note (Signed)
Brief history: The patient is a 71 year old white male who has had previous spine surgery by another physician years ago. He has developed back and leg pain consistent with neurogenic claudication. He has failed medical management and was worked up with a lumbar MRI. This demonstrated the patient had a large synovial cyst at L2-3 with severe spinal stenosis. I discussed the various treatment options with the patient including surgery. He has weighed the risks, benefits, and alternatives to surgery and decided proceed with a L2-3 laminectomy for resection of a synovial cyst.  Preoperative diagnosis: L2-3 synovial cyst, spinal stenosis, lumbago, lumbar radiculopathy, neurogenic claudication  Postoperative diagnosis: Same  Procedure: L2-3 laminectomy with resection of synovial cyst using micro-dissection  Surgeon: Dr. Earle Gell  Asst.: Dr. Annette Stable  Anesthesia: Gen. endotracheal  Estimated blood loss: 100 mL  Drains: None  Complications: None  Description of procedure: The patient was brought to the operating room by the anesthesia team. General endotracheal anesthesia was induced. The patient was turned to the prone position on the Wilson frame. The patient's lumbosacral region was then prepared with Betadine scrub and Betadine solution. Sterile drapes were applied.  I then injected the area to be incised with Marcaine with epinephrine solution. I then used a scalpel to make a linear midline incision over the L2-3 intervertebral disc space. I then used electrocautery to perform a bilateral subperiosteal dissection exposing the spinous process and lamina of L2 and L3. We obtained intraoperative radiograph to confirm our location. I then inserted the [facility] retractor for exposure. I used electrocautery to incise interspinous ligament at L1-2 and L2-3. I removed the L2 spinous process with the rongeurs.  We then brought the operative microscope into the field. Under its magnification and  illumination we completed the microdissection. I used a high-speed drill to perform a laminotomy at L2-3 bilaterally. I then used a Kerrison punches to complete the L2 laminectomy, perform partial laminectomy of the cephalad L3 lamina, and widen the bilateral L2-3 laminotomies and removed the ligamentum flavum at L2-3, and L1-2. We encountered a large generated synovial cyst. We then used microdissection to free up the thecal sac from the synovial cyst . I then used a Kerrison punch and pituitary forceps to remove the simple cyst which was adherent to the dura.  I then palpated along the ventral surface of the thecal sac and along exit route of the bilateral L2 and L3 nerve root and noted that the neural structures were well decompressed. This completed the decompression.  We then obtained hemostasis using bipolar electrocautery. We irrigated the wound out with bacitracin solution. We then removed the retractor. We then reapproximated the patient's thoracolumbar fascia with interrupted #1 Vicryl suture. We then reapproximated the patient's subcutaneous tissue with interrupted 2-0 Vicryl suture. We then reapproximated patient's skin with Steri-Strips and benzoin. The was then coated with bacitracin ointment. The drapes were removed. The patient was subsequently returned to the supine position where they were extubated by the anesthesia team. The patient was then transported to the postanesthesia care unit in stable condition. All sponge instrument and needle counts were reportedly correct at the end of this case.

## 2017-04-29 NOTE — Anesthesia Procedure Notes (Signed)
Procedure Name: Intubation Date/Time: 04/29/2017 9:49 AM Performed by: Izora Gala Pre-anesthesia Checklist: Patient identified, Emergency Drugs available, Suction available and Patient being monitored Patient Re-evaluated:Patient Re-evaluated prior to inductionOxygen Delivery Method: Circle system utilized Preoxygenation: Pre-oxygenation with 100% oxygen Intubation Type: IV induction Ventilation: Mask ventilation without difficulty Laryngoscope Size: Miller and 3 Grade View: Grade I Tube type: Oral Tube size: 8.0 mm Number of attempts: 2 (Patient incompletely relaxed, attempted too early) Airway Equipment and Method: Stylet Placement Confirmation: ETT inserted through vocal cords under direct vision,  positive ETCO2 and breath sounds checked- equal and bilateral Secured at: 22 cm Tube secured with: Tape Dental Injury: Teeth and Oropharynx as per pre-operative assessment

## 2017-04-30 ENCOUNTER — Encounter (HOSPITAL_COMMUNITY): Payer: Self-pay | Admitting: Neurosurgery

## 2017-04-30 DIAGNOSIS — Z9842 Cataract extraction status, left eye: Secondary | ICD-10-CM | POA: Diagnosis not present

## 2017-04-30 DIAGNOSIS — N289 Disorder of kidney and ureter, unspecified: Secondary | ICD-10-CM | POA: Diagnosis not present

## 2017-04-30 DIAGNOSIS — D649 Anemia, unspecified: Secondary | ICD-10-CM | POA: Diagnosis not present

## 2017-04-30 DIAGNOSIS — M5416 Radiculopathy, lumbar region: Secondary | ICD-10-CM | POA: Diagnosis not present

## 2017-04-30 DIAGNOSIS — M109 Gout, unspecified: Secondary | ICD-10-CM | POA: Diagnosis not present

## 2017-04-30 DIAGNOSIS — Z87442 Personal history of urinary calculi: Secondary | ICD-10-CM | POA: Diagnosis not present

## 2017-04-30 DIAGNOSIS — M4802 Spinal stenosis, cervical region: Secondary | ICD-10-CM | POA: Diagnosis not present

## 2017-04-30 DIAGNOSIS — M19072 Primary osteoarthritis, left ankle and foot: Secondary | ICD-10-CM | POA: Diagnosis not present

## 2017-04-30 DIAGNOSIS — E1151 Type 2 diabetes mellitus with diabetic peripheral angiopathy without gangrene: Secondary | ICD-10-CM | POA: Diagnosis not present

## 2017-04-30 DIAGNOSIS — K219 Gastro-esophageal reflux disease without esophagitis: Secondary | ICD-10-CM | POA: Diagnosis not present

## 2017-04-30 DIAGNOSIS — J45909 Unspecified asthma, uncomplicated: Secondary | ICD-10-CM | POA: Diagnosis not present

## 2017-04-30 DIAGNOSIS — M7138 Other bursal cyst, other site: Secondary | ICD-10-CM | POA: Diagnosis not present

## 2017-04-30 DIAGNOSIS — M48062 Spinal stenosis, lumbar region with neurogenic claudication: Secondary | ICD-10-CM | POA: Diagnosis not present

## 2017-04-30 DIAGNOSIS — Z8673 Personal history of transient ischemic attack (TIA), and cerebral infarction without residual deficits: Secondary | ICD-10-CM | POA: Diagnosis not present

## 2017-04-30 DIAGNOSIS — M19031 Primary osteoarthritis, right wrist: Secondary | ICD-10-CM | POA: Diagnosis not present

## 2017-04-30 DIAGNOSIS — I499 Cardiac arrhythmia, unspecified: Secondary | ICD-10-CM | POA: Diagnosis not present

## 2017-04-30 DIAGNOSIS — I714 Abdominal aortic aneurysm, without rupture: Secondary | ICD-10-CM | POA: Diagnosis not present

## 2017-04-30 DIAGNOSIS — E785 Hyperlipidemia, unspecified: Secondary | ICD-10-CM | POA: Diagnosis not present

## 2017-04-30 DIAGNOSIS — Z87891 Personal history of nicotine dependence: Secondary | ICD-10-CM | POA: Diagnosis not present

## 2017-04-30 DIAGNOSIS — Z9841 Cataract extraction status, right eye: Secondary | ICD-10-CM | POA: Diagnosis not present

## 2017-04-30 DIAGNOSIS — M4690 Unspecified inflammatory spondylopathy, site unspecified: Secondary | ICD-10-CM | POA: Diagnosis not present

## 2017-04-30 DIAGNOSIS — I10 Essential (primary) hypertension: Secondary | ICD-10-CM | POA: Diagnosis not present

## 2017-04-30 DIAGNOSIS — Z833 Family history of diabetes mellitus: Secondary | ICD-10-CM | POA: Diagnosis not present

## 2017-04-30 LAB — HEMOGLOBIN A1C
Hgb A1c MFr Bld: 6.5 % — ABNORMAL HIGH (ref 4.8–5.6)
Mean Plasma Glucose: 140 mg/dL

## 2017-04-30 LAB — GLUCOSE, CAPILLARY: Glucose-Capillary: 236 mg/dL — ABNORMAL HIGH (ref 65–99)

## 2017-04-30 MED ORDER — CYCLOBENZAPRINE HCL 10 MG PO TABS: 10.0000 mg | ORAL_TABLET | Freq: Three times a day (TID) | ORAL | 1 refills | Status: DC | PRN

## 2017-04-30 MED ORDER — DOCUSATE SODIUM 100 MG PO CAPS: 100.0000 mg | ORAL_CAPSULE | Freq: Two times a day (BID) | ORAL | 0 refills | Status: DC

## 2017-04-30 MED ORDER — HYDROCODONE-ACETAMINOPHEN 10-325 MG PO TABS: 1.0000 | ORAL_TABLET | ORAL | 0 refills | Status: DC | PRN

## 2017-04-30 NOTE — Progress Notes (Signed)
Discharge instruction reviewed with patient/family. All questions answered at this. RXs given. Transport home by family.   Ave Filter, RN

## 2017-04-30 NOTE — Progress Notes (Signed)
Patient refsued morning medication this AM. He verbalized he will take them at home. Family at bedside.    Ave Filter, RN

## 2017-04-30 NOTE — Discharge Summary (Signed)
Physician Discharge Summary  Patient ID: [name] MRN: [id number] DOB/AGE: 70-May-1947 70 y.o.  Admit date: 04/29/2017 Discharge date: 04/30/2017  Admission Diagnoses:L2-3 synovial cyst, spinal stenosis, lumbago, lumbar radiculopathy, neurogenic claudication  Discharge Diagnoses: The same Active Problems:   Synovial cyst of lumbar facet joint   Discharged Condition: good  Hospital Course: I performed an L2-3 laminectomy with resection of synovial cyst on the patient on 04/29/2017. The surgery went well.  The patient's postoperative course was unremarkable. On postoperative day #1 the patient requested discharge home. The patient, and his wife, were given written and oral discharge instructions. All their questions were answered.  Consults: Physical therapy Significant Diagnostic Studies: None Treatments: L2-3 laminectomy for resection of synovial cyst using microdissection. Discharge Exam: Blood pressure (!) 143/70, pulse 97, temperature 98.2 F (36.8 C), temperature source Oral, resp. rate 19, weight 105.5 kg (232 lb 9.6 oz), SpO2 92 %. The patient is alert and pleasant. His dressing has a small bloodstained. His strength is normal in his lower extremities.  Disposition: Home  Discharge Instructions    Call MD for:  difficulty breathing, headache or visual disturbances    Complete by:  As directed    Call MD for:  extreme fatigue    Complete by:  As directed    Call MD for:  hives    Complete by:  As directed    Call MD for:  persistant dizziness or light-headedness    Complete by:  As directed    Call MD for:  persistant nausea and vomiting    Complete by:  As directed    Call MD for:  redness, tenderness, or signs of infection (pain, swelling, redness, odor or green/yellow discharge around incision site)    Complete by:  As directed    Call MD for:  severe uncontrolled pain    Complete by:  As directed    Call MD for:  temperature >100.4    Complete by:  As  directed    Diet - low sodium heart healthy    Complete by:  As directed    Discharge instructions    Complete by:  As directed    Call [phone] for a followup appointment. Take a stool softener while you are using pain medications.   Driving Restrictions    Complete by:  As directed    Do not drive for 2 weeks.   Increase activity slowly    Complete by:  As directed    Lifting restrictions    Complete by:  As directed    Do not lift more than 5 pounds. No excessive bending or twisting.   May shower / Bathe    Complete by:  As directed    He may shower after the pain she is removed 3 days after surgery. Leave the incision alone.   Remove dressing in 48 hours    Complete by:  As directed    Your stitches are under the scan and will dissolve by themselves. The Steri-Strips will fall off after you take a few showers. Do not rub back or pick at the wound, Leave the wound alone.     Allergies as of 04/30/2017      Reactions   No Known Allergies       Medication List    STOP taking these medications   traMADol 50 MG tablet Commonly known as:  ULTRAM     TAKE these medications   ALPRAZolam 0.25 MG tablet Commonly known as:  XANAX 1-2 by mouth prior to MRI of needed for anxiety.   amLODipine 5 MG tablet Commonly known as:  NORVASC Take 1 tablet (5 mg total) by mouth daily.   aspirin EC 81 MG tablet Take 81 mg by mouth at bedtime.   atorvastatin 80 MG tablet Commonly known as:  LIPITOR TAKE 1 TABLET BY MOUTH  EVERY DAY AT 6PM   carvedilol 25 MG tablet Commonly known as:  COREG TAKE 1/2 TABLET BY MOUTH  TWICE A DAY WITH MEALS   cholecalciferol 1000 units tablet Commonly known as:  VITAMIN D Take 1,000 Units by mouth daily.   cloNIDine 0.2 MG tablet Commonly known as:  CATAPRES Take 0.5 tablets (0.1 mg total) by mouth 2 (two) times daily.   cyclobenzaprine 10 MG tablet Commonly known as:  FLEXERIL Take 1 tablet (10 mg total) by mouth 3 (three) times daily as  needed for muscle spasms. What changed:  medication strength  how much to take  how to take this  when to take this  reasons to take this  additional instructions   docusate sodium 100 MG capsule Commonly known as:  COLACE Take 1 capsule (100 mg total) by mouth 2 (two) times daily.   Fish Oil 1000 MG Caps Take 1,000 mg by mouth 2 (two) times daily.   gabapentin 300 MG capsule Commonly known as:  NEURONTIN Take 2 capsules by mouth 3 times daily What changed:  how much to take  how to take this  when to take this  additional instructions   glimepiride 2 MG tablet Commonly known as:  AMARYL TAKE 1 TABLET BY MOUTH 3  TIMES DAILY   hydrochlorothiazide 12.5 MG capsule Commonly known as:  MICROZIDE Take 1 capsule (12.5 mg total) by mouth daily.   HYDROcodone-acetaminophen 10-325 MG tablet Commonly known as:  NORCO Take 1 tablet by mouth every 4 (four) hours as needed for moderate pain. What changed:  when to take this  reasons to take this  Another medication with the same name was removed. Continue taking this medication, and follow the directions you see here.   latanoprost 0.005 % ophthalmic solution Commonly known as:  XALATAN Place 1 drop into both eyes at bedtime.   losartan 50 MG tablet Commonly known as:  COZAAR Take 1 tablet (50 mg total) by mouth daily. Take 1 tablet by mouth  daily   Magnesium 500 MG Tabs Take 500 mg by mouth daily.   metFORMIN 500 MG tablet Commonly known as:  GLUCOPHAGE Take 500 mg by mouth 2 (two) times daily with a meal.   metFORMIN 1000 MG tablet Commonly known as:  GLUCOPHAGE TAKE 1 TABLET EVERY  MORNING, 1/2 TABLET AT  LUNCH, AND 1 TABLET EVERY  EVENING.   multivitamin tablet Take 1 tablet by mouth daily.   PROAIR HFA 108 (90 Base) MCG/ACT inhaler Generic drug:  albuterol INHALE 2 PUFFS INTO THE LUNGS EVERY 6 HOURS AS NEEDED FOR WHEEZING OR SHORTNESS OF BREATH   vitamin B-12 1000 MCG tablet Commonly known as:   CYANOCOBALAMIN Take 1,000 mcg by mouth 2 (two) times a week. Monday and Thursday   vitamin C 1000 MG tablet Take 1,000 mg by mouth daily.        SignedOphelia Charter 04/30/2017, 7:33 AM

## 2017-04-30 NOTE — Progress Notes (Signed)
Physical Therapy Treatment Patient Details Name: [name] MRN: [id number] DOB: 21-Apr-1946 Today's Date: 04/30/2017    History of Present Illness Pt is 71 y/o male s/p L2-3 laminectomy with resection of synovial cyst.   PMH includes AAA, asthma, DM, chronic brochitis, PVD, CVA with no residual deficits, peripheral neuropathy, and s/p lumbar laminectomy and carotid endarterectomy.     PT Comments    Pt progressing towards physical therapy goals. Was able to perform transfers and ambulation with gross supervision for safety and no AD. Pt declines stair training as he has a ramp to enter home. Pt was educated on precautions, walking program, car transfer, and general safety with mobility progression. Will continue to follow and progress as able per POC.    Follow Up Recommendations  No PT follow up;Supervision for mobility/OOB     Equipment Recommendations  None recommended by PT    Recommendations for Other Services       Precautions / Restrictions Precautions Precautions: Back Precaution Booklet Issued: Yes (comment) Precaution Comments: Reviewed back precaution handout with pt.  Restrictions Weight Bearing Restrictions: No    Mobility  Bed Mobility Overal bed mobility: Needs Assistance Bed Mobility: Rolling;Sidelying to Sit Rolling: Modified independent (Device/Increase time) Sidelying to sit: Supervision       General bed mobility comments: VC's for proper log roll technique. Pt performed with HOB flat and rails lowered to simulate home environment.   Transfers Overall transfer level: Needs assistance Equipment used: None Transfers: Sit to/from Stand Sit to Stand: Supervision         General transfer comment: Close supervision for safety as pt powered-up to full standing position EOB. No assist required.   Ambulation/Gait Ambulation/Gait assistance: Supervision Ambulation Distance (Feet): 200 Feet Assistive device: None Gait Pattern/deviations:  Step-through pattern;Decreased stride length Gait velocity: Decreased Gait velocity interpretation: Below normal speed for age/gender General Gait Details: Slow, somewhat guarded gait with mild unsteadiness, but no LOB noted. Required multiple standing rest breaks secondary to L hip muscle fatigue. Pt reports the LLE feels "tired." Distance limited secondary to LLE fatigue.    Stairs            Wheelchair Mobility    Modified Rankin (Stroke Patients Only)       Balance Overall balance assessment: Needs assistance Sitting-balance support: No upper extremity supported;Feet supported Sitting balance-Leahy Scale: Good     Standing balance support: No upper extremity supported;During functional activity Standing balance-Leahy Scale: Fair                              Cognition Arousal/Alertness: Awake/alert Behavior During Therapy: WFL for tasks assessed/performed Overall Cognitive Status: Within Functional Limits for tasks assessed                                        Exercises      General Comments        Pertinent Vitals/Pain Pain Assessment: Faces Faces Pain Scale: Hurts little more Pain Location: back  Pain Descriptors / Indicators: Sore;Operative site guarding;Grimacing Pain Intervention(s): Limited activity within patient's tolerance;Monitored during session;Repositioned    Home Living                      Prior Function            PT Goals (current goals can now  be found in the care plan section) Acute Rehab PT Goals Patient Stated Goal: to decrease soreness PT Goal Formulation: With patient Time For Goal Achievement: 05/06/17 Potential to Achieve Goals: Good Progress towards PT goals: Progressing toward goals    Frequency    Min 5X/week      PT Plan Current plan remains appropriate    Co-evaluation              AM-PAC PT "6 Clicks" Daily Activity  Outcome Measure  Difficulty turning over in  bed (including adjusting bedclothes, sheets and blankets)?: None Difficulty moving from lying on back to sitting on the side of the bed? : A Little Difficulty sitting down on and standing up from a chair with arms (e.g., wheelchair, bedside commode, etc,.)?: A Little Help needed moving to and from a bed to chair (including a wheelchair)?: A Little Help needed walking in hospital room?: A Little Help needed climbing 3-5 steps with a railing? : A Little 6 Click Score: 19    End of Session Equipment Utilized During Treatment: Gait belt Activity Tolerance: Patient limited by pain Patient left: in bed;with call bell/phone within reach Nurse Communication: Mobility status PT Visit Diagnosis: Other abnormalities of gait and mobility (R26.89);Pain Pain - part of body:  (back )     Time: 2585-2778 PT Time Calculation (min) (ACUTE ONLY): 14 min  Charges:  $Gait Training: 8-22 mins                    G Codes:  Functional Assessment Tool Used: AM-PAC 6 Clicks Basic Mobility Functional Limitation: Mobility: Walking and moving around Mobility: Walking and Moving Around Current Status (E4235): At least 60 percent but less than 80 percent impaired, limited or restricted Mobility: Walking and Moving Around Goal Status [id number]): At least 1 percent but less than 20 percent impaired, limited or restricted    Rolinda Roan, PT, DPT Acute Rehabilitation Services Pager: [place] 04/30/2017, 9:34 AM

## 2017-05-30 ENCOUNTER — Other Ambulatory Visit: Payer: Self-pay | Admitting: Family Medicine

## 2017-06-27 DIAGNOSIS — H401131 Primary open-angle glaucoma, bilateral, mild stage: Secondary | ICD-10-CM | POA: Diagnosis not present

## 2017-07-01 ENCOUNTER — Ambulatory Visit: Payer: Medicare Other | Admitting: Podiatry

## 2017-07-02 ENCOUNTER — Encounter: Payer: Self-pay | Admitting: Family Medicine

## 2017-07-02 ENCOUNTER — Ambulatory Visit (INDEPENDENT_AMBULATORY_CARE_PROVIDER_SITE_OTHER): Payer: Medicare Other | Admitting: Family Medicine

## 2017-07-02 VITALS — BP 130/66 | HR 94 | Temp 98.6°F | Ht 70.0 in | Wt 232.0 lb

## 2017-07-02 DIAGNOSIS — Z23 Encounter for immunization: Secondary | ICD-10-CM

## 2017-07-02 DIAGNOSIS — E1149 Type 2 diabetes mellitus with other diabetic neurological complication: Secondary | ICD-10-CM | POA: Diagnosis not present

## 2017-07-02 DIAGNOSIS — E785 Hyperlipidemia, unspecified: Secondary | ICD-10-CM

## 2017-07-02 DIAGNOSIS — I10 Essential (primary) hypertension: Secondary | ICD-10-CM | POA: Diagnosis not present

## 2017-07-02 DIAGNOSIS — M25562 Pain in left knee: Secondary | ICD-10-CM | POA: Diagnosis not present

## 2017-07-02 DIAGNOSIS — J449 Chronic obstructive pulmonary disease, unspecified: Secondary | ICD-10-CM

## 2017-07-02 NOTE — Patient Instructions (Signed)

## 2017-07-02 NOTE — Assessment & Plan Note (Signed)
Encouraged heart healthy diet, increase exercise, avoid trans fats, consider a krill oil cap daily 
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
## 2017-07-02 NOTE — Assessment & Plan Note (Signed)
Doing well no recent exacerbations. Has canceled his appointment with pulmonology for now

## 2017-07-02 NOTE — Assessment & Plan Note (Signed)
hgba1c acceptable, minimize simple carbs. Increase exercise as tolerated. Continue current meds 

## 2017-07-02 NOTE — Assessment & Plan Note (Signed)
Well controlled, no changes to meds. Encouraged heart healthy diet such as the DASH diet and exercise as tolerated.  °

## 2017-07-02 NOTE — Assessment & Plan Note (Signed)
Encouraged ice, topical treatments and bracing if no improvement notify us so we can set up consultation

## 2017-07-03 ENCOUNTER — Ambulatory Visit: Payer: Medicare Other | Admitting: Podiatry

## 2017-07-04 ENCOUNTER — Ambulatory Visit: Payer: Medicare Other | Admitting: Emergency Medicine

## 2017-07-11 DIAGNOSIS — L409 Psoriasis, unspecified: Secondary | ICD-10-CM | POA: Diagnosis not present

## 2017-08-05 ENCOUNTER — Ambulatory Visit: Payer: Medicare Other | Admitting: Podiatry

## 2017-08-11 ENCOUNTER — Other Ambulatory Visit: Payer: Self-pay | Admitting: Family Medicine

## 2017-09-13 ENCOUNTER — Other Ambulatory Visit: Payer: Self-pay

## 2017-09-13 NOTE — Patient Outreach (Signed)
[name]) Care Management  09/13/2017  [name] 1946-04-30 [id number]   Medication adherence call to [name] patient is showing past due under [facility] Ins.on glimepiride 2 mg spoke to patient  wife [name] she said she takes care of patient medication and sometimes patient forgets to take his lunch time tablet this is the reason why he still has medication she said he will need it soon but,she will order it for him from [facility] mail order at the end of the week when she fill his pill box.  [facility] Direct Dial [phone]  Fax [phone] [name]

## 2017-10-05 ENCOUNTER — Other Ambulatory Visit: Payer: Self-pay | Admitting: Family Medicine

## 2017-11-05 ENCOUNTER — Other Ambulatory Visit: Payer: Self-pay | Admitting: Family Medicine

## 2017-11-21 ENCOUNTER — Encounter: Payer: Self-pay | Admitting: Family Medicine

## 2017-11-21 ENCOUNTER — Ambulatory Visit (INDEPENDENT_AMBULATORY_CARE_PROVIDER_SITE_OTHER): Payer: Medicare Other | Admitting: Family Medicine

## 2017-11-21 VITALS — BP 129/68 | HR 67 | Temp 98.5°F | Resp 18 | Ht 69.0 in | Wt 234.0 lb

## 2017-11-21 DIAGNOSIS — Z1211 Encounter for screening for malignant neoplasm of colon: Secondary | ICD-10-CM | POA: Insufficient documentation

## 2017-11-21 DIAGNOSIS — E785 Hyperlipidemia, unspecified: Secondary | ICD-10-CM

## 2017-11-21 DIAGNOSIS — I10 Essential (primary) hypertension: Secondary | ICD-10-CM

## 2017-11-21 DIAGNOSIS — Z0001 Encounter for general adult medical examination with abnormal findings: Secondary | ICD-10-CM

## 2017-11-21 DIAGNOSIS — M25551 Pain in right hip: Secondary | ICD-10-CM | POA: Insufficient documentation

## 2017-11-21 DIAGNOSIS — E1149 Type 2 diabetes mellitus with other diabetic neurological complication: Secondary | ICD-10-CM

## 2017-11-21 DIAGNOSIS — M1A9XX0 Chronic gout, unspecified, without tophus (tophi): Secondary | ICD-10-CM | POA: Diagnosis not present

## 2017-11-21 DIAGNOSIS — Z23 Encounter for immunization: Secondary | ICD-10-CM

## 2017-11-21 DIAGNOSIS — Z0000 Encounter for general adult medical examination without abnormal findings: Secondary | ICD-10-CM | POA: Insufficient documentation

## 2017-11-21 LAB — LIPID PANEL
Cholesterol: 116 mg/dL (ref 0–200)
HDL: 26.7 mg/dL — ABNORMAL LOW (ref >39.00)
LDL Cholesterol: 59 mg/dL (ref 0–99)
NonHDL: 89.61
Total CHOL/HDL Ratio: 4
Triglycerides: 151 mg/dL — ABNORMAL HIGH (ref 0.0–149.0)
VLDL: 30.2 mg/dL (ref 0.0–40.0)

## 2017-11-21 LAB — CBC
HCT: 42.4 % (ref 39.0–52.0)
Hemoglobin: 14.4 g/dL (ref 13.0–17.0)
MCHC: 33.8 g/dL (ref 30.0–36.0)
MCV: 96 fl (ref 78.0–100.0)
Platelets: 238 10*3/uL (ref 150.0–400.0)
RBC: 4.42 Mil/uL (ref 4.22–5.81)
RDW: 14.2 % (ref 11.5–15.5)
WBC: 7.1 10*3/uL (ref 4.0–10.5)

## 2017-11-21 LAB — COMPREHENSIVE METABOLIC PANEL
ALT: 16 U/L (ref 0–53)
AST: 17 U/L (ref 0–37)
Albumin: 4.3 g/dL (ref 3.5–5.2)
Alkaline Phosphatase: 69 U/L (ref 39–117)
BUN: 15 mg/dL (ref 6–23)
CO2: 32 mEq/L (ref 19–32)
Calcium: 9.5 mg/dL (ref 8.4–10.5)
Chloride: 100 mEq/L (ref 96–112)
Creatinine, Ser: 0.74 mg/dL (ref 0.40–1.50)
GFR: 111.05 mL/min (ref >60.00)
Glucose, Bld: 134 mg/dL — ABNORMAL HIGH (ref 70–99)
Potassium: 4.6 mEq/L (ref 3.5–5.1)
Sodium: 139 mEq/L (ref 135–145)
Total Bilirubin: 0.5 mg/dL (ref 0.2–1.2)
Total Protein: 7 g/dL (ref 6.0–8.3)

## 2017-11-21 LAB — TSH: TSH: 2.39 u[IU]/mL (ref 0.35–4.50)

## 2017-11-21 LAB — HEMOGLOBIN A1C: Hgb A1c MFr Bld: 6.8 % — ABNORMAL HIGH (ref 4.6–6.5)

## 2017-11-21 LAB — URIC ACID: Uric Acid, Serum: 6.6 mg/dL (ref 4.0–7.8)

## 2017-11-21 NOTE — Assessment & Plan Note (Signed)
Patient encouraged to maintain heart healthy diet, regular exercise, adequate sleep. Consider daily probiotics. Take medications as prescribed. Labs ordered requested HCPA.

## 2017-11-21 NOTE — Assessment & Plan Note (Signed)
Tolerating statin, encouraged heart healthy diet, avoid trans fats, minimize simple carbs and saturated fats. Increase exercise as tolerated 

## 2017-11-21 NOTE — Assessment & Plan Note (Signed)
hgba1c acceptable, minimize simple carbs. Increase exercise as tolerated.  
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
## 2017-11-21 NOTE — Assessment & Plan Note (Signed)
Provider, please review the patient's drug allergy history. Some issues need clarification. Uric acid checked

## 2017-11-21 NOTE — Assessment & Plan Note (Signed)
Well controlled, no changes to meds. Encouraged heart healthy diet such as the DASH diet and exercise as tolerated.  °

## 2017-11-21 NOTE — Assessment & Plan Note (Addendum)
Bothers him every other day worse with increased activity but he declines for referral for now. Will call if he is ready for referral

## 2017-11-21 NOTE — Patient Instructions (Signed)
Want health care power of attorney Preventive Care 71 Years and Older, Male Preventive care refers to lifestyle choices and visits with your health care provider that can promote health and wellness. What does preventive care include?  A yearly physical exam. This is also called an annual well check.  Dental exams once or twice a year.  Routine eye exams. Ask your health care provider how often you should have your eyes checked.  Personal lifestyle choices, including: ? Daily care of your teeth and gums. ? Regular physical activity. ? Eating a healthy diet. ? Avoiding tobacco and drug use. ? Limiting alcohol use. ? Practicing safe sex. ? Taking low doses of aspirin every day. ? Taking vitamin and mineral supplements as recommended by your health care provider. What happens during an annual well check? The services and screenings done by your health care provider during your annual well check will depend on your age, overall health, lifestyle risk factors, and family history of disease. Counseling Your health care provider may ask you questions about your:  Alcohol use.  Tobacco use.  Drug use.  Emotional well-being.  Home and relationship well-being.  Sexual activity.  Eating habits.  History of falls.  Memory and ability to understand (cognition).  Work and work Statistician.  Screening You may have the following tests or measurements:  Height, weight, and BMI.  Blood pressure.  Lipid and cholesterol levels. These may be checked every 5 years, or more frequently if you are over 71 years old.  Skin check.  Lung cancer screening. You may have this screening every year starting at age 71 if you have a 30-pack-year history of smoking and currently smoke or have quit within the past 15 years.  Fecal occult blood test (FOBT) of the stool. You may have this test every year starting at age 71.  Flexible sigmoidoscopy or colonoscopy. You may have a sigmoidoscopy  every 5 years or a colonoscopy every 10 years starting at age 71.  Prostate cancer screening. Recommendations will vary depending on your family history and other risks.  Hepatitis C blood test.  Hepatitis B blood test.  Sexually transmitted disease (STD) testing.  Diabetes screening. This is done by checking your blood sugar (glucose) after you have not eaten for a while (fasting). You may have this done every 1-3 years.  Abdominal aortic aneurysm (AAA) screening. You may need this if you are a current or former smoker.  Osteoporosis. You may be screened starting at age 71 if you are at high risk.  Talk with your health care provider about your test results, treatment options, and if necessary, the need for more tests. Vaccines Your health care provider may recommend certain vaccines, such as:  Influenza vaccine. This is recommended every year.  Tetanus, diphtheria, and acellular pertussis (Tdap, Td) vaccine. You may need a Td booster every 10 years.  Varicella vaccine. You may need this if you have not been vaccinated.  Zoster vaccine. You may need this after age 71.  Measles, mumps, and rubella (MMR) vaccine. You may need at least one dose of MMR if you were born in 1957 or later. You may also need a second dose.  Pneumococcal 13-valent conjugate (PCV13) vaccine. One dose is recommended after age 71.  Pneumococcal polysaccharide (PPSV23) vaccine. One dose is recommended after age 71.  Meningococcal vaccine. You may need this if you have certain conditions.  Hepatitis A vaccine. You may need this if you have certain conditions or if you travel  or work in places where you may be exposed to hepatitis A.  Hepatitis B vaccine. You may need this if you have certain conditions or if you travel or work in places where you may be exposed to hepatitis B.  Haemophilus influenzae type b (Hib) vaccine. You may need this if you have certain risk factors.  Talk to your health care  provider about which screenings and vaccines you need and how often you need them. This information is not intended to replace advice given to you by your health care provider. Make sure you discuss any questions you have with your health care provider. Document Released: 11/18/2015 Document Revised: 07/11/2016 Document Reviewed: 08/23/2015 Elsevier Interactive Patient Education  [employer].

## 2017-11-22 NOTE — Addendum Note (Signed)
Addended by: [name] A on: 11/22/2017 12:11 PM   Modules accepted: Orders

## 2017-11-23 ENCOUNTER — Encounter: Payer: Self-pay | Admitting: Family Medicine

## 2017-11-25 MED ORDER — GABAPENTIN 300 MG PO CAPS: ORAL_CAPSULE | ORAL | 1 refills | Status: DC

## 2017-12-02 ENCOUNTER — Ambulatory Visit: Payer: Medicare Other | Admitting: Podiatry

## 2017-12-02 ENCOUNTER — Encounter: Payer: Self-pay | Admitting: Podiatry

## 2017-12-02 DIAGNOSIS — M21619 Bunion of unspecified foot: Secondary | ICD-10-CM

## 2017-12-02 DIAGNOSIS — M79675 Pain in left toe(s): Secondary | ICD-10-CM

## 2017-12-02 DIAGNOSIS — E1140 Type 2 diabetes mellitus with diabetic neuropathy, unspecified: Secondary | ICD-10-CM

## 2017-12-02 DIAGNOSIS — L84 Corns and callosities: Secondary | ICD-10-CM | POA: Diagnosis not present

## 2017-12-02 DIAGNOSIS — B351 Tinea unguium: Secondary | ICD-10-CM

## 2017-12-02 DIAGNOSIS — M79674 Pain in right toe(s): Secondary | ICD-10-CM

## 2017-12-02 DIAGNOSIS — E1149 Type 2 diabetes mellitus with other diabetic neurological complication: Secondary | ICD-10-CM

## 2017-12-04 NOTE — Progress Notes (Signed)
Subjective:   Patient ID: [name], male   DOB: 72 y.o.   MRN: [id number]   HPI Patient presents with significant digital deformities with thick yellow brittle nailbeds 1-5 both feet that are painful.  Also complains of chronic lesion with slight breakdown of the hallux bilateral and long-term diabetes with diminished feeling   ROS      Objective:  Physical Exam  Thick yellow brittle nailbeds 1-5 both feet that are painful.  Patient does have diabetes with diminished sharp dull vibratory and was noted to have pre-ulcerative type keratotic lesions hallux bilateral that is been present for a long time     Assessment:  Mycotic nail infection with pain 1-5 both feet.  Chronic lesion formation secondary to structure of the feet with ulcerative type situation     Plan:  Reviewed the at risk condition he has with his long-term diabetes nail disease and chronic lesion formation.  Today debridement accomplished 1-5 both feet and lesion formation debrided walk bilateral hallux with no iatrogenic bleeding and discussed long-term diabetic shoes and he will be scanned for diabetic shoes at this time when we get approval

## 2017-12-05 ENCOUNTER — Ambulatory Visit (INDEPENDENT_AMBULATORY_CARE_PROVIDER_SITE_OTHER): Payer: Medicare Other

## 2017-12-05 ENCOUNTER — Ambulatory Visit: Payer: Medicare Other | Admitting: Family Medicine

## 2017-12-05 ENCOUNTER — Other Ambulatory Visit: Payer: Self-pay

## 2017-12-05 ENCOUNTER — Encounter: Payer: Self-pay | Admitting: Family Medicine

## 2017-12-05 VITALS — BP 128/60 | HR 87 | Temp 99.2°F | Resp 16 | Ht 69.0 in | Wt 230.8 lb

## 2017-12-05 DIAGNOSIS — R062 Wheezing: Secondary | ICD-10-CM

## 2017-12-05 DIAGNOSIS — R6889 Other general symptoms and signs: Secondary | ICD-10-CM

## 2017-12-05 DIAGNOSIS — R05 Cough: Secondary | ICD-10-CM

## 2017-12-05 DIAGNOSIS — R059 Cough, unspecified: Secondary | ICD-10-CM

## 2017-12-05 DIAGNOSIS — R0602 Shortness of breath: Secondary | ICD-10-CM | POA: Diagnosis not present

## 2017-12-05 LAB — POCT CBC
Granulocyte percent: 67.2 %G (ref 37–80)
HCT, POC: 43.5 % (ref 43.5–53.7)
Hemoglobin: 14.6 g/dL (ref 14.1–18.1)
Lymph, poc: 2.3 (ref 0.6–3.4)
MCH, POC: 32.1 pg — AB (ref 27–31.2)
MCHC: 33.5 g/dL (ref 31.8–35.4)
MCV: 95.8 fL (ref 80–97)
MID (cbc): 0.3 (ref 0–0.9)
MPV: 7.6 fL (ref 0–99.8)
POC Granulocyte: 5.4 (ref 2–6.9)
POC LYMPH PERCENT: 28.7 %L (ref 10–50)
POC MID %: 4.1 %M (ref 0–12)
Platelet Count, POC: 240 10*3/uL (ref 142–424)
RBC: 4.54 M/uL — AB (ref 4.69–6.13)
RDW, POC: 14.4 %
WBC: 8.1 10*3/uL (ref 4.6–10.2)

## 2017-12-05 LAB — POC INFLUENZA A&B (BINAX/QUICKVUE)
Influenza A, POC: NEGATIVE
Influenza B, POC: NEGATIVE

## 2017-12-05 MED ORDER — PREDNISONE 10 MG PO TABS: ORAL_TABLET | ORAL | 0 refills | Status: AC

## 2017-12-05 MED ORDER — AMOXICILLIN-POT CLAVULANATE 875-125 MG PO TABS: 1.0000 | ORAL_TABLET | Freq: Two times a day (BID) | ORAL | 0 refills | Status: DC

## 2017-12-05 NOTE — Patient Instructions (Addendum)
CLINICAL DATA:  Wheezing.  Dyspnea.  Cough.  EXAM: CHEST  2 VIEW  COMPARISON:  04/05/2017  FINDINGS: Hyperinflation. Midline trachea. Normal heart size. Atherosclerosis in the transverse aorta. No pleural effusion or pneumothorax. Azygos fissure. Suspect right greater than left apical scarring.  IMPRESSION: Hyperinflation, without acute disease.  Aortic Atherosclerosis (ICD10-I70.0).   Electronically Signed   By: Abigail Miyamoto M.D.   On: 12/05/2017 12:08    IF you received an x-ray today, you will receive an invoice from [facility]. Please contact [facility] at [phone] with questions or concerns regarding your invoice.   IF you received labwork today, you will receive an invoice from [facility]. Please contact [facility] at [phone] with questions or concerns regarding your invoice.   Our billing staff will not be able to assist you with questions regarding bills from these companies.  You will be contacted with the lab results as soon as they are available. The fastest way to get your results is to activate your My Chart account. Instructions are located on the last page of this paperwork. If you have not heard from [name] regarding the results in 2 weeks, please contact this office.     Acute Bronchitis, Adult Acute bronchitis is sudden (acute) swelling of the air tubes (bronchi) in the lungs. Acute bronchitis causes these tubes to fill with mucus, which can make it hard to breathe. It can also cause coughing or wheezing. In adults, acute bronchitis usually goes away within 2 weeks. A cough caused by bronchitis may last up to 3 weeks. Smoking, allergies, and asthma can make the condition worse. Repeated episodes of bronchitis may cause further lung problems, such as chronic obstructive pulmonary disease (COPD). What are the causes? This condition can be caused by germs and by substances that irritate the lungs, including:  Cold and flu viruses.  This condition is most often caused by the same virus that causes a cold.  Bacteria.  Exposure to tobacco smoke, dust, fumes, and air pollution.  What increases the risk? This condition is more likely to develop in people who:  Have close contact with someone with acute bronchitis.  Are exposed to lung irritants, such as tobacco smoke, dust, fumes, and vapors.  Have a weak immune system.  Have a respiratory condition such as asthma.  What are the signs or symptoms? Symptoms of this condition include:  A cough.  Coughing up clear, yellow, or green mucus.  Wheezing.  Chest congestion.  Shortness of breath.  A fever.  Body aches.  Chills.  A sore throat.  How is this diagnosed? This condition is usually diagnosed with a physical exam. During the exam, your health care provider may order tests, such as chest X-rays, to rule out other conditions. He or she may also:  Test a sample of your mucus for bacterial infection.  Check the level of oxygen in your blood. This is done to check for pneumonia.  Do a chest X-ray or lung function testing to rule out pneumonia and other conditions.  Perform blood tests.  Your health care provider will also ask about your symptoms and medical history. How is this treated? Most cases of acute bronchitis clear up over time without treatment. Your health care provider may recommend:  Drinking more fluids. Drinking more makes your mucus thinner, which may make it easier to breathe.  Taking a medicine for a fever or cough.  Taking an antibiotic medicine.  Using an inhaler to help improve shortness of breath and  to control a cough.  Using a cool mist vaporizer or humidifier to make it easier to breathe.  Follow these instructions at home: Medicines  Take over-the-counter and prescription medicines only as told by your health care provider.  If you were prescribed an antibiotic, take it as told by your health care provider. Do  not stop taking the antibiotic even if you start to feel better. General instructions  Get plenty of rest.  Drink enough fluids to keep your urine clear or pale yellow.  Avoid smoking and secondhand smoke. Exposure to cigarette smoke or irritating chemicals will make bronchitis worse. If you smoke and you need help quitting, ask your health care provider. Quitting smoking will help your lungs heal faster.  Use an inhaler, cool mist vaporizer, or humidifier as told by your health care provider.  Keep all follow-up visits as told by your health care provider. This is important. How is this prevented? To lower your risk of getting this condition again:  Wash your hands often with soap and water. If soap and water are not available, use hand sanitizer.  Avoid contact with people who have cold symptoms.  Try not to touch your hands to your mouth, nose, or eyes.  Make sure to get the flu shot every year.  Contact a health care provider if:  Your symptoms do not improve in 2 weeks of treatment. Get help right away if:  You cough up blood.  You have chest pain.  You have severe shortness of breath.  You become dehydrated.  You faint or keep feeling like you are going to faint.  You keep vomiting.  You have a severe headache.  Your fever or chills gets worse. This information is not intended to replace advice given to you by your health care provider. Make sure you discuss any questions you have with your health care provider. Document Released: 11/29/2004 Document Revised: 05/16/2016 Document Reviewed: 04/11/2016 Elsevier Interactive Patient Education  [employer].
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
## 2017-12-06 ENCOUNTER — Telehealth: Payer: Self-pay | Admitting: Podiatry

## 2017-12-06 LAB — BASIC METABOLIC PANEL
BUN/Creatinine Ratio: 20 (ref 10–24)
BUN: 17 mg/dL (ref 8–27)
CO2: 26 mmol/L (ref 20–29)
Calcium: 9.5 mg/dL (ref 8.6–10.2)
Chloride: 98 mmol/L (ref 96–106)
Creatinine, Ser: 0.85 mg/dL (ref 0.76–1.27)
GFR calc Af Amer: 102 mL/min/{1.73_m2} (ref >59)
GFR calc non Af Amer: 88 mL/min/{1.73_m2} (ref >59)
Glucose: 118 mg/dL — ABNORMAL HIGH (ref 65–99)
Potassium: 4.6 mmol/L (ref 3.5–5.2)
Sodium: 139 mmol/L (ref 134–144)

## 2017-12-06 LAB — BRAIN NATRIURETIC PEPTIDE: BNP: 10.6 pg/mL (ref 0.0–100.0)

## 2017-12-06 NOTE — Telephone Encounter (Signed)
Left message on home number for pt to call to schedule an appt for diabetic shoe measurements. Cell number voicemail is full.

## 2017-12-13 ENCOUNTER — Ambulatory Visit: Payer: Medicare Other | Admitting: Orthotics

## 2017-12-14 ENCOUNTER — Other Ambulatory Visit: Payer: Self-pay | Admitting: Family Medicine

## 2017-12-24 ENCOUNTER — Encounter: Payer: Self-pay | Admitting: Family Medicine

## 2017-12-24 MED ORDER — ALBUTEROL SULFATE HFA 108 (90 BASE) MCG/ACT IN AERS: 2.0000 | INHALATION_SPRAY | Freq: Four times a day (QID) | RESPIRATORY_TRACT | 1 refills | Status: DC | PRN

## 2017-12-27 ENCOUNTER — Telehealth: Payer: Self-pay | Admitting: *Deleted

## 2017-12-27 NOTE — Telephone Encounter (Signed)
Received Physician Orders from [place]; forwarded to provider with OV notes to provide documentation to Medicare/SLS

## 2018-01-28 ENCOUNTER — Telehealth: Payer: Self-pay | Admitting: Podiatry

## 2018-01-28 NOTE — Telephone Encounter (Signed)
Left message on cell number for pt to call to schedule an appt to pick up diabetic shoes.  I also spoke to wife to have pt call to schedule an appt.

## 2018-01-28 NOTE — Telephone Encounter (Signed)
Pt returned call and is schedule for 4.1.19

## 2018-02-03 ENCOUNTER — Ambulatory Visit (INDEPENDENT_AMBULATORY_CARE_PROVIDER_SITE_OTHER): Payer: Medicare Other | Admitting: Orthotics

## 2018-02-03 DIAGNOSIS — M79675 Pain in left toe(s): Secondary | ICD-10-CM

## 2018-02-03 DIAGNOSIS — B351 Tinea unguium: Secondary | ICD-10-CM

## 2018-02-03 DIAGNOSIS — E1140 Type 2 diabetes mellitus with diabetic neuropathy, unspecified: Secondary | ICD-10-CM | POA: Diagnosis not present

## 2018-02-03 DIAGNOSIS — M21619 Bunion of unspecified foot: Secondary | ICD-10-CM | POA: Diagnosis not present

## 2018-02-03 DIAGNOSIS — L84 Corns and callosities: Secondary | ICD-10-CM

## 2018-02-03 DIAGNOSIS — E1149 Type 2 diabetes mellitus with other diabetic neurological complication: Secondary | ICD-10-CM

## 2018-02-03 DIAGNOSIS — M79674 Pain in right toe(s): Secondary | ICD-10-CM

## 2018-02-03 NOTE — Progress Notes (Signed)

## 2018-02-06 DIAGNOSIS — H401131 Primary open-angle glaucoma, bilateral, mild stage: Secondary | ICD-10-CM | POA: Diagnosis not present

## 2018-02-08 ENCOUNTER — Other Ambulatory Visit: Payer: Self-pay | Admitting: Family Medicine

## 2018-03-03 ENCOUNTER — Ambulatory Visit: Payer: Medicare Other | Admitting: Podiatry

## 2018-03-19 ENCOUNTER — Encounter: Payer: Self-pay | Admitting: Family Medicine

## 2018-04-05 ENCOUNTER — Other Ambulatory Visit: Payer: Self-pay | Admitting: Family Medicine

## 2018-05-07 ENCOUNTER — Other Ambulatory Visit: Payer: Self-pay | Admitting: Family Medicine

## 2018-05-22 ENCOUNTER — Ambulatory Visit (INDEPENDENT_AMBULATORY_CARE_PROVIDER_SITE_OTHER): Payer: Medicare Other | Admitting: Family Medicine

## 2018-05-22 DIAGNOSIS — E785 Hyperlipidemia, unspecified: Secondary | ICD-10-CM | POA: Diagnosis not present

## 2018-05-22 DIAGNOSIS — F329 Major depressive disorder, single episode, unspecified: Secondary | ICD-10-CM

## 2018-05-22 DIAGNOSIS — E1149 Type 2 diabetes mellitus with other diabetic neurological complication: Secondary | ICD-10-CM

## 2018-05-22 DIAGNOSIS — F418 Other specified anxiety disorders: Secondary | ICD-10-CM

## 2018-05-22 DIAGNOSIS — M1A9XX0 Chronic gout, unspecified, without tophus (tophi): Secondary | ICD-10-CM | POA: Diagnosis not present

## 2018-05-22 DIAGNOSIS — I10 Essential (primary) hypertension: Secondary | ICD-10-CM

## 2018-05-22 DIAGNOSIS — F32A Depression, unspecified: Secondary | ICD-10-CM

## 2018-05-22 DIAGNOSIS — M549 Dorsalgia, unspecified: Secondary | ICD-10-CM

## 2018-05-22 DIAGNOSIS — F419 Anxiety disorder, unspecified: Secondary | ICD-10-CM

## 2018-05-22 DIAGNOSIS — D649 Anemia, unspecified: Secondary | ICD-10-CM | POA: Diagnosis not present

## 2018-05-22 LAB — COMPREHENSIVE METABOLIC PANEL
ALT: 24 U/L (ref 0–53)
AST: 21 U/L (ref 0–37)
Albumin: 4.5 g/dL (ref 3.5–5.2)
Alkaline Phosphatase: 78 U/L (ref 39–117)
BUN: 19 mg/dL (ref 6–23)
CO2: 34 mEq/L — ABNORMAL HIGH (ref 19–32)
Calcium: 9.9 mg/dL (ref 8.4–10.5)
Chloride: 98 mEq/L (ref 96–112)
Creatinine, Ser: 0.93 mg/dL (ref 0.40–1.50)
GFR: 85.19 mL/min (ref >60.00)
Glucose, Bld: 199 mg/dL — ABNORMAL HIGH (ref 70–99)
Potassium: 4.9 mEq/L (ref 3.5–5.1)
Sodium: 139 mEq/L (ref 135–145)
Total Bilirubin: 0.7 mg/dL (ref 0.2–1.2)
Total Protein: 7.2 g/dL (ref 6.0–8.3)

## 2018-05-22 LAB — CBC
HCT: 43.2 % (ref 39.0–52.0)
Hemoglobin: 14.7 g/dL (ref 13.0–17.0)
MCHC: 34 g/dL (ref 30.0–36.0)
MCV: 96.1 fl (ref 78.0–100.0)
Platelets: 233 10*3/uL (ref 150.0–400.0)
RBC: 4.5 Mil/uL (ref 4.22–5.81)
RDW: 13.8 % (ref 11.5–15.5)
WBC: 6.8 10*3/uL (ref 4.0–10.5)

## 2018-05-22 LAB — URIC ACID: Uric Acid, Serum: 6.2 mg/dL (ref 4.0–7.8)

## 2018-05-22 LAB — HEMOGLOBIN A1C: Hgb A1c MFr Bld: 8.8 % — ABNORMAL HIGH (ref 4.6–6.5)

## 2018-05-22 LAB — LIPID PANEL
Cholesterol: 149 mg/dL (ref 0–200)
HDL: 34.1 mg/dL — ABNORMAL LOW (ref >39.00)
LDL Cholesterol: 77 mg/dL (ref 0–99)
NonHDL: 114.65
Total CHOL/HDL Ratio: 4
Triglycerides: 186 mg/dL — ABNORMAL HIGH (ref 0.0–149.0)
VLDL: 37.2 mg/dL (ref 0.0–40.0)

## 2018-05-22 LAB — TSH: TSH: 2.11 u[IU]/mL (ref 0.35–4.50)

## 2018-05-22 MED ORDER — FLUOXETINE HCL 20 MG PO TABS: 20.0000 mg | ORAL_TABLET | Freq: Every day | ORAL | 3 refills | Status: DC

## 2018-05-22 MED ORDER — TIZANIDINE HCL 4 MG PO TABS: 2.0000 mg | ORAL_TABLET | Freq: Two times a day (BID) | ORAL | 0 refills | Status: DC | PRN

## 2018-05-22 MED ORDER — ALPRAZOLAM 0.25 MG PO TABS: 0.2500 mg | ORAL_TABLET | Freq: Two times a day (BID) | ORAL | 1 refills | Status: DC | PRN

## 2018-05-22 NOTE — Assessment & Plan Note (Signed)
Increase leafy greens, consider increased lean red meat and using cast iron cookware. Continue to monitor, report any concerns 
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
## 2018-05-22 NOTE — Assessment & Plan Note (Signed)
hgba1c acceptable, minimize simple carbs. Increase exercise as tolerated. Continue current meds 

## 2018-05-22 NOTE — Assessment & Plan Note (Signed)
Well controlled, no changes to meds. Encouraged heart healthy diet such as the DASH diet and exercise as tolerated.  °

## 2018-05-22 NOTE — Patient Instructions (Addendum)
MIND or DASH Eating Plan DASH stands for "Dietary Approaches to Stop Hypertension." The DASH eating plan is a healthy eating plan that has been shown to reduce high blood pressure (hypertension). It may also reduce your risk for type 2 diabetes, heart disease, and stroke. The DASH eating plan may also help with weight loss. What are tips for following this plan? General guidelines  Avoid eating more than 2,300 mg (milligrams) of salt (sodium) a day. If you have hypertension, you may need to reduce your sodium intake to 1,500 mg a day.  Limit alcohol intake to no more than 1 drink a day for nonpregnant women and 2 drinks a day for men. One drink equals 12 oz of beer, 5 oz of wine, or 1 oz of hard liquor.  Work with your health care provider to maintain a healthy body weight or to lose weight. Ask what an ideal weight is for you.  Get at least 30 minutes of exercise that causes your heart to beat faster (aerobic exercise) most days of the week. Activities may include walking, swimming, or biking.  Work with your health care provider or diet and nutrition specialist (dietitian) to adjust your eating plan to your individual calorie needs. Reading food labels  Check food labels for the amount of sodium per serving. Choose foods with less than 5 percent of the Daily Value of sodium. Generally, foods with less than 300 mg of sodium per serving fit into this eating plan.  To find whole grains, look for the word "whole" as the first word in the ingredient list. Shopping  Buy products labeled as "low-sodium" or "no salt added."  Buy fresh foods. Avoid canned foods and premade or frozen meals. Cooking  Avoid adding salt when cooking. Use salt-free seasonings or herbs instead of table salt or sea salt. Check with your health care provider or pharmacist before using salt substitutes.  Do not fry foods. Cook foods using healthy methods such as baking, boiling, grilling, and broiling instead.  Cook  with heart-healthy oils, such as olive, canola, soybean, or sunflower oil. Meal planning   Eat a balanced diet that includes: ? 5 or more servings of fruits and vegetables each day. At each meal, try to fill half of your plate with fruits and vegetables. ? Up to 6-8 servings of whole grains each day. ? Less than 6 oz of lean meat, poultry, or fish each day. A 3-oz serving of meat is about the same size as a deck of cards. One egg equals 1 oz. ? 2 servings of low-fat dairy each day. ? A serving of nuts, seeds, or beans 5 times each week. ? Heart-healthy fats. Healthy fats called Omega-3 fatty acids are found in foods such as flaxseeds and coldwater fish, like sardines, salmon, and mackerel.  Limit how much you eat of the following: ? Canned or prepackaged foods. ? Food that is high in trans fat, such as fried foods. ? Food that is high in saturated fat, such as fatty meat. ? Sweets, desserts, sugary drinks, and other foods with added sugar. ? Full-fat dairy products.  Do not salt foods before eating.  Try to eat at least 2 vegetarian meals each week.  Eat more home-cooked food and less restaurant, buffet, and fast food.  When eating at a restaurant, ask that your food be prepared with less salt or no salt, if possible. What foods are recommended? The items listed may not be a complete list. Talk with your dietitian  about what dietary choices are best for you. Grains Whole-grain or whole-wheat bread. Whole-grain or whole-wheat pasta. Brown rice. Modena Morrow. Bulgur. Whole-grain and low-sodium cereals. Pita bread. Low-fat, low-sodium crackers. Whole-wheat flour tortillas. Vegetables Fresh or frozen vegetables (raw, steamed, roasted, or grilled). Low-sodium or reduced-sodium tomato and vegetable juice. Low-sodium or reduced-sodium tomato sauce and tomato paste. Low-sodium or reduced-sodium canned vegetables. Fruits All fresh, dried, or frozen fruit. Canned fruit in natural juice  (without added sugar). Meat and other protein foods Skinless chicken or [place]. Ground chicken or [place]. Pork with fat trimmed off. Fish and seafood. Egg whites. Dried beans, peas, or lentils. Unsalted nuts, nut butters, and seeds. Unsalted canned beans. Lean cuts of beef with fat trimmed off. Low-sodium, lean deli meat. Dairy Low-fat (1%) or fat-free (skim) milk. Fat-free, low-fat, or reduced-fat cheeses. Nonfat, low-sodium ricotta or cottage cheese. Low-fat or nonfat yogurt. Low-fat, low-sodium cheese. Fats and oils Soft margarine without trans fats. Vegetable oil. Low-fat, reduced-fat, or light mayonnaise and salad dressings (reduced-sodium). Canola, safflower, olive, soybean, and sunflower oils. Avocado. Seasoning and other foods Herbs. Spices. Seasoning mixes without salt. Unsalted popcorn and pretzels. Fat-free sweets. What foods are not recommended? The items listed may not be a complete list. Talk with your dietitian about what dietary choices are best for you. Grains Baked goods made with fat, such as croissants, muffins, or some breads. Dry pasta or rice meal packs. Vegetables Creamed or fried vegetables. Vegetables in a cheese sauce. Regular canned vegetables (not low-sodium or reduced-sodium). Regular canned tomato sauce and paste (not low-sodium or reduced-sodium). Regular tomato and vegetable juice (not low-sodium or reduced-sodium). Angie Fava. Olives. Fruits Canned fruit in a light or heavy syrup. Fried fruit. Fruit in cream or butter sauce. Meat and other protein foods Fatty cuts of meat. Ribs. Fried meat. Berniece Salines. Sausage. Bologna and other processed lunch meats. Salami. Fatback. Hotdogs. Bratwurst. Salted nuts and seeds. Canned beans with added salt. Canned or smoked fish. Whole eggs or egg yolks. Chicken or [place] with skin. Dairy Whole or 2% milk, cream, and half-and-half. Whole or full-fat cream cheese. Whole-fat or sweetened yogurt. Full-fat cheese. Nondairy creamers. Whipped  toppings. Processed cheese and cheese spreads. Fats and oils Butter. Stick margarine. Lard. Shortening. Ghee. Bacon fat. Tropical oils, such as coconut, palm kernel, or palm oil. Seasoning and other foods Salted popcorn and pretzels. Onion salt, garlic salt, seasoned salt, table salt, and sea salt. Worcestershire sauce. Tartar sauce. Barbecue sauce. Teriyaki sauce. Soy sauce, including reduced-sodium. Steak sauce. Canned and packaged gravies. Fish sauce. Oyster sauce. Cocktail sauce. Horseradish that you find on the shelf. Ketchup. Mustard. Meat flavorings and tenderizers. Bouillon cubes. Hot sauce and Tabasco sauce. Premade or packaged marinades. Premade or packaged taco seasonings. Relishes. Regular salad dressings. Where to find more information:  National Heart, Lung, and [place]: https://wilson-eaton.com/  American Heart Association: www.heart.org Summary  The DASH eating plan is a healthy eating plan that has been shown to reduce high blood pressure (hypertension). It may also reduce your risk for type 2 diabetes, heart disease, and stroke.  With the DASH eating plan, you should limit salt (sodium) intake to 2,300 mg a day. If you have hypertension, you may need to reduce your sodium intake to 1,500 mg a day.  When on the DASH eating plan, aim to eat more fresh fruits and vegetables, whole grains, lean proteins, low-fat dairy, and heart-healthy fats.  Work with your health care provider or diet and nutrition specialist (dietitian) to adjust your eating plan to  your individual calorie needs. This information is not intended to replace advice given to you by your health care provider. Make sure you discuss any questions you have with your health care provider. Document Released: 10/11/2011 Document Revised: 10/15/2016 Document Reviewed: 10/15/2016 Elsevier Interactive Patient Education  [employer].

## 2018-05-22 NOTE — Assessment & Plan Note (Signed)
Tolerating statin, encouraged heart healthy diet, avoid trans fats, minimize simple carbs and saturated fats. Increase exercise as tolerated 

## 2018-05-22 NOTE — Assessment & Plan Note (Signed)
Check uric acid. 

## 2018-05-23 MED ORDER — GLIMEPIRIDE 4 MG PO TABS: 4.0000 mg | ORAL_TABLET | Freq: Two times a day (BID) | ORAL | 3 refills | Status: DC

## 2018-05-25 DIAGNOSIS — F418 Other specified anxiety disorders: Secondary | ICD-10-CM | POA: Insufficient documentation

## 2018-05-25 DIAGNOSIS — F32A Depression, unspecified: Secondary | ICD-10-CM | POA: Insufficient documentation

## 2018-05-25 DIAGNOSIS — F329 Major depressive disorder, single episode, unspecified: Secondary | ICD-10-CM

## 2018-05-25 DIAGNOSIS — F419 Anxiety disorder, unspecified: Secondary | ICD-10-CM | POA: Insufficient documentation

## 2018-05-25 HISTORY — DX: Major depressive disorder, single episode, unspecified: F32.9

## 2018-05-25 NOTE — Assessment & Plan Note (Signed)
Also does acknowledge some recent mild memory concerns which are not disrupting his activities thus far but he is increasingly struggling with anhedonia, irritability, anxiety and depressive mood. Unclear if memory loss or depression are the lead cause. Will start Fluoxetine 20 mg daily and Alprazolam prn. Reassess at next visit and may need to consider neurology evaluation if memory loss continues to be a prominent feature

## 2018-06-26 DIAGNOSIS — H401131 Primary open-angle glaucoma, bilateral, mild stage: Secondary | ICD-10-CM | POA: Diagnosis not present

## 2018-06-26 DIAGNOSIS — H524 Presbyopia: Secondary | ICD-10-CM | POA: Diagnosis not present

## 2018-07-11 ENCOUNTER — Other Ambulatory Visit: Payer: Self-pay

## 2018-07-11 NOTE — Patient Outreach (Signed)
[facility]) Care Management  07/11/2018  [name] Mar 30, 1946 [id number]   Medication Adherence call to [name] left a message for patient to call back patient is due on Losartan 50 mg and Atorvastatin 80 mg. [name] is showing past due under [place].  [place] [facility] Management Direct Dial [phone]  Fax [phone] [email or web address]

## 2018-07-16 ENCOUNTER — Other Ambulatory Visit: Payer: Self-pay | Admitting: Family Medicine

## 2018-07-24 ENCOUNTER — Encounter: Payer: Self-pay | Admitting: Family Medicine

## 2018-07-24 ENCOUNTER — Ambulatory Visit (INDEPENDENT_AMBULATORY_CARE_PROVIDER_SITE_OTHER): Payer: Medicare Other | Admitting: Family Medicine

## 2018-07-24 VITALS — BP 118/60 | HR 73 | Temp 98.1°F | Resp 18 | Wt 239.8 lb

## 2018-07-24 DIAGNOSIS — S61209A Unspecified open wound of unspecified finger without damage to nail, initial encounter: Secondary | ICD-10-CM | POA: Diagnosis not present

## 2018-07-24 DIAGNOSIS — Z23 Encounter for immunization: Secondary | ICD-10-CM

## 2018-07-24 DIAGNOSIS — M545 Low back pain, unspecified: Secondary | ICD-10-CM

## 2018-07-24 DIAGNOSIS — E1149 Type 2 diabetes mellitus with other diabetic neurological complication: Secondary | ICD-10-CM | POA: Diagnosis not present

## 2018-07-24 DIAGNOSIS — S61219A Laceration without foreign body of unspecified finger without damage to nail, initial encounter: Secondary | ICD-10-CM

## 2018-07-24 DIAGNOSIS — I10 Essential (primary) hypertension: Secondary | ICD-10-CM | POA: Diagnosis not present
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
## 2018-07-24 NOTE — Patient Instructions (Addendum)
Shingrix is the new shiingles shot, 2 shots over 2-6 months at the phamacy  Tylenol ES  500 mg tabs 1 tab twice daily Hypertension Hypertension is another name for high blood pressure. High blood pressure forces your heart to work harder to pump blood. This can cause problems over time. There are two numbers in a blood pressure reading. There is a top number (systolic) over a bottom number (diastolic). It is best to have a blood pressure below 120/80. Healthy choices can help lower your blood pressure. You may need medicine to help lower your blood pressure if:  Your blood pressure cannot be lowered with healthy choices.  Your blood pressure is higher than 130/80.  Follow these instructions at home: Eating and drinking  If directed, follow the DASH eating plan. This diet includes: ? Filling half of your plate at each meal with fruits and vegetables. ? Filling one quarter of your plate at each meal with whole grains. Whole grains include whole wheat pasta, brown rice, and whole grain bread. ? Eating or drinking low-fat dairy products, such as skim milk or low-fat yogurt. ? Filling one quarter of your plate at each meal with low-fat (lean) proteins. Low-fat proteins include fish, skinless chicken, eggs, beans, and tofu. ? Avoiding fatty meat, cured and processed meat, or chicken with skin. ? Avoiding premade or processed food.  Eat less than 1,500 mg of salt (sodium) a day.  Limit alcohol use to no more than 1 drink a day for nonpregnant women and 2 drinks a day for men. One drink equals 12 oz of beer, 5 oz of wine, or 1 oz of hard liquor. Lifestyle  Work with your doctor to stay at a healthy weight or to lose weight. Ask your doctor what the best weight is for you.  Get at least 30 minutes of exercise that causes your heart to beat faster (aerobic exercise) most days of the week. This may include walking, swimming, or biking.  Get at least 30 minutes of exercise that strengthens your  muscles (resistance exercise) at least 3 days a week. This may include lifting weights or pilates.  Do not use any products that contain nicotine or tobacco. This includes cigarettes and e-cigarettes. If you need help quitting, ask your doctor.  Check your blood pressure at home as told by your doctor.  Keep all follow-up visits as told by your doctor. This is important. Medicines  Take over-the-counter and prescription medicines only as told by your doctor. Follow directions carefully.  Do not skip doses of blood pressure medicine. The medicine does not work as well if you skip doses. Skipping doses also puts you at risk for problems.  Ask your doctor about side effects or reactions to medicines that you should watch for. Contact a doctor if:  You think you are having a reaction to the medicine you are taking.  You have headaches that keep coming back (recurring).  You feel dizzy.  You have swelling in your ankles.  You have trouble with your vision. Get help right away if:  You get a very bad headache.  You start to feel confused.  You feel weak or numb.  You feel faint.  You get very bad pain in your: ? Chest. ? Belly (abdomen).  You throw up (vomit) more than once.  You have trouble breathing. Summary  Hypertension is another name for high blood pressure.  Making healthy choices can help lower blood pressure. If your blood pressure cannot be  controlled with healthy choices, you may need to take medicine. This information is not intended to replace advice given to you by your health care provider. Make sure you discuss any questions you have with your health care provider. Document Released: 04/09/2008 Document Revised: 09/19/2016 Document Reviewed: 09/19/2016 Elsevier Interactive Patient Education  [employer].

## 2018-07-27 DIAGNOSIS — S61209A Unspecified open wound of unspecified finger without damage to nail, initial encounter: Secondary | ICD-10-CM | POA: Insufficient documentation

## 2018-07-27 DIAGNOSIS — S61219A Laceration without foreign body of unspecified finger without damage to nail, initial encounter: Secondary | ICD-10-CM | POA: Insufficient documentation

## 2018-07-27 NOTE — Assessment & Plan Note (Signed)
Encouraged moist heat and gentle stretching as tolerated. May try NSAIDs and prescription meds as directed and report if symptoms worsen or seek immediate care, try lidocaine topically

## 2018-07-27 NOTE — Assessment & Plan Note (Signed)
Right 3rd finger, is given a Td today. Keep clean and dry and report if becomes painful red, or spreads

## 2018-07-27 NOTE — Assessment & Plan Note (Signed)
Well controlled, no changes to meds. Encouraged heart healthy diet such as the DASH diet and exercise as tolerated.  °

## 2018-07-27 NOTE — Assessment & Plan Note (Signed)
hgba1c acceptable, minimize simple carbs. Increase exercise as tolerated. Continue current meds 

## 2018-07-28 ENCOUNTER — Other Ambulatory Visit: Payer: Self-pay

## 2018-07-28 DIAGNOSIS — L570 Actinic keratosis: Secondary | ICD-10-CM | POA: Diagnosis not present

## 2018-07-28 DIAGNOSIS — L409 Psoriasis, unspecified: Secondary | ICD-10-CM | POA: Diagnosis not present

## 2018-07-28 DIAGNOSIS — C44629 Squamous cell carcinoma of skin of left upper limb, including shoulder: Secondary | ICD-10-CM | POA: Diagnosis not present

## 2018-07-28 DIAGNOSIS — C4492 Squamous cell carcinoma of skin, unspecified: Secondary | ICD-10-CM

## 2018-07-28 HISTORY — DX: Squamous cell carcinoma of skin, unspecified: C44.92

## 2018-08-14 ENCOUNTER — Encounter (INDEPENDENT_AMBULATORY_CARE_PROVIDER_SITE_OTHER): Payer: Medicare Other

## 2018-08-19 ENCOUNTER — Encounter (INDEPENDENT_AMBULATORY_CARE_PROVIDER_SITE_OTHER): Payer: Medicare Other

## 2018-08-22 ENCOUNTER — Other Ambulatory Visit: Payer: Self-pay | Admitting: Family Medicine

## 2018-08-25 ENCOUNTER — Encounter: Payer: Self-pay | Admitting: Family Medicine

## 2018-08-25 ENCOUNTER — Ambulatory Visit (INDEPENDENT_AMBULATORY_CARE_PROVIDER_SITE_OTHER): Payer: Medicare Other | Admitting: Bariatrics

## 2018-08-25 ENCOUNTER — Other Ambulatory Visit: Payer: Self-pay | Admitting: Family Medicine

## 2018-08-25 ENCOUNTER — Encounter (INDEPENDENT_AMBULATORY_CARE_PROVIDER_SITE_OTHER): Payer: Self-pay | Admitting: Bariatrics

## 2018-08-25 VITALS — BP 106/64 | HR 71 | Temp 98.1°F | Ht 68.0 in | Wt 236.0 lb

## 2018-08-25 DIAGNOSIS — Z0289 Encounter for other administrative examinations: Secondary | ICD-10-CM

## 2018-08-25 DIAGNOSIS — E559 Vitamin D deficiency, unspecified: Secondary | ICD-10-CM | POA: Diagnosis not present

## 2018-08-25 DIAGNOSIS — I714 Abdominal aortic aneurysm, without rupture, unspecified: Secondary | ICD-10-CM

## 2018-08-25 DIAGNOSIS — I10 Essential (primary) hypertension: Secondary | ICD-10-CM | POA: Diagnosis not present

## 2018-08-25 DIAGNOSIS — F3289 Other specified depressive episodes: Secondary | ICD-10-CM | POA: Diagnosis not present

## 2018-08-25 DIAGNOSIS — E1149 Type 2 diabetes mellitus with other diabetic neurological complication: Secondary | ICD-10-CM | POA: Diagnosis not present

## 2018-08-25 DIAGNOSIS — R0602 Shortness of breath: Secondary | ICD-10-CM | POA: Diagnosis not present

## 2018-08-25 DIAGNOSIS — R5383 Other fatigue: Secondary | ICD-10-CM

## 2018-08-25 DIAGNOSIS — E7849 Other hyperlipidemia: Secondary | ICD-10-CM

## 2018-08-25 DIAGNOSIS — Z6836 Body mass index (BMI) 36.0-36.9, adult: Secondary | ICD-10-CM | POA: Diagnosis not present

## 2018-08-25 DIAGNOSIS — Z8679 Personal history of other diseases of the circulatory system: Secondary | ICD-10-CM

## 2018-08-26 LAB — COMPREHENSIVE METABOLIC PANEL
ALT: 21 IU/L (ref 0–44)
AST: 17 IU/L (ref 0–40)
Albumin/Globulin Ratio: 1.9 (ref 1.2–2.2)
Albumin: 4.6 g/dL (ref 3.5–4.8)
Alkaline Phosphatase: 92 IU/L (ref 39–117)
BUN/Creatinine Ratio: 21 (ref 10–24)
BUN: 16 mg/dL (ref 8–27)
Bilirubin Total: 0.3 mg/dL (ref 0.0–1.2)
CO2: 27 mmol/L (ref 20–29)
Calcium: 9.7 mg/dL (ref 8.6–10.2)
Chloride: 97 mmol/L (ref 96–106)
Creatinine, Ser: 0.76 mg/dL (ref 0.76–1.27)
GFR calc Af Amer: 106 mL/min/{1.73_m2} (ref >59)
GFR calc non Af Amer: 92 mL/min/{1.73_m2} (ref >59)
Globulin, Total: 2.4 g/dL (ref 1.5–4.5)
Glucose: 170 mg/dL — ABNORMAL HIGH (ref 65–99)
Potassium: 4.7 mmol/L (ref 3.5–5.2)
Sodium: 141 mmol/L (ref 134–144)
Total Protein: 7 g/dL (ref 6.0–8.5)

## 2018-08-26 LAB — VITAMIN D 25 HYDROXY (VIT D DEFICIENCY, FRACTURES): Vit D, 25-Hydroxy: 41.2 ng/mL (ref 30.0–100.0)

## 2018-08-26 LAB — INSULIN, RANDOM: INSULIN: 29 u[IU]/mL — ABNORMAL HIGH (ref 2.6–24.9)

## 2018-08-26 LAB — HEMOGLOBIN A1C
Est. average glucose Bld gHb Est-mCnc: 180 mg/dL
Hgb A1c MFr Bld: 7.9 % — ABNORMAL HIGH (ref 4.8–5.6)

## 2018-08-26 LAB — MICROALBUMIN / CREATININE URINE RATIO
Creatinine, Urine: 69.5 mg/dL
Microalb/Creat Ratio: 6.6 mg/g creat (ref 0.0–30.0)
Microalbumin, Urine: 4.6 ug/mL

## 2018-08-27 DIAGNOSIS — Z8679 Personal history of other diseases of the circulatory system: Secondary | ICD-10-CM | POA: Insufficient documentation
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
## 2018-08-28 ENCOUNTER — Ambulatory Visit (INDEPENDENT_AMBULATORY_CARE_PROVIDER_SITE_OTHER): Payer: Medicare Other | Admitting: Family Medicine

## 2018-09-03 ENCOUNTER — Other Ambulatory Visit: Payer: Self-pay | Admitting: Family Medicine

## 2018-09-08 ENCOUNTER — Encounter (INDEPENDENT_AMBULATORY_CARE_PROVIDER_SITE_OTHER): Payer: Self-pay | Admitting: Bariatrics

## 2018-09-08 ENCOUNTER — Encounter: Payer: Self-pay | Admitting: Family Medicine

## 2018-09-08 ENCOUNTER — Ambulatory Visit (INDEPENDENT_AMBULATORY_CARE_PROVIDER_SITE_OTHER): Payer: Medicare Other | Admitting: Bariatrics

## 2018-09-08 VITALS — BP 112/65 | HR 72 | Temp 97.8°F | Ht 68.0 in | Wt 235.0 lb

## 2018-09-08 DIAGNOSIS — Z6835 Body mass index (BMI) 35.0-35.9, adult: Secondary | ICD-10-CM

## 2018-09-08 DIAGNOSIS — E559 Vitamin D deficiency, unspecified: Secondary | ICD-10-CM | POA: Diagnosis not present

## 2018-09-08 DIAGNOSIS — I714 Abdominal aortic aneurysm, without rupture, unspecified: Secondary | ICD-10-CM

## 2018-09-08 DIAGNOSIS — E1151 Type 2 diabetes mellitus with diabetic peripheral angiopathy without gangrene: Secondary | ICD-10-CM

## 2018-09-08 DIAGNOSIS — I10 Essential (primary) hypertension: Secondary | ICD-10-CM
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
## 2018-09-18 ENCOUNTER — Encounter: Payer: Self-pay | Admitting: Family Medicine

## 2018-09-18 ENCOUNTER — Ambulatory Visit (HOSPITAL_BASED_OUTPATIENT_CLINIC_OR_DEPARTMENT_OTHER)
Admission: RE | Admit: 2018-09-18 | Discharge: 2018-09-18 | Disposition: A | Discharge status: Home / Self Care | Payer: Medicare Other | Source: Ambulatory Visit | Attending: Family Medicine | Admitting: Family Medicine

## 2018-09-18 DIAGNOSIS — N200 Calculus of kidney: Secondary | ICD-10-CM | POA: Diagnosis not present

## 2018-09-18 DIAGNOSIS — M545 Low back pain, unspecified: Secondary | ICD-10-CM

## 2018-09-18 DIAGNOSIS — I714 Abdominal aortic aneurysm, without rupture, unspecified: Secondary | ICD-10-CM

## 2018-09-18 DIAGNOSIS — N400 Enlarged prostate without lower urinary tract symptoms: Secondary | ICD-10-CM | POA: Insufficient documentation

## 2018-09-18 DIAGNOSIS — M25551 Pain in right hip: Secondary | ICD-10-CM | POA: Diagnosis not present

## 2018-09-18 DIAGNOSIS — I701 Atherosclerosis of renal artery: Secondary | ICD-10-CM | POA: Insufficient documentation

## 2018-09-18 DIAGNOSIS — E279 Disorder of adrenal gland, unspecified: Secondary | ICD-10-CM | POA: Diagnosis not present

## 2018-09-18 DIAGNOSIS — M25552 Pain in left hip: Secondary | ICD-10-CM | POA: Diagnosis not present

## 2018-09-18 DIAGNOSIS — M7138 Other bursal cyst, other site: Secondary | ICD-10-CM | POA: Insufficient documentation

## 2018-09-18 MED ORDER — IOPAMIDOL (ISOVUE-370) INJECTION 76%
100.0000 mL | Freq: Once | INTRAVENOUS | Status: AC | PRN
  Administered 2018-09-18: 100 mL via INTRAVENOUS

## 2018-09-22 ENCOUNTER — Encounter (INDEPENDENT_AMBULATORY_CARE_PROVIDER_SITE_OTHER): Payer: Self-pay | Admitting: Bariatrics

## 2018-09-22 ENCOUNTER — Encounter: Payer: Self-pay | Admitting: Family Medicine

## 2018-09-22 ENCOUNTER — Ambulatory Visit (INDEPENDENT_AMBULATORY_CARE_PROVIDER_SITE_OTHER): Payer: Medicare Other | Admitting: Bariatrics

## 2018-09-22 VITALS — BP 117/68 | HR 82 | Temp 97.5°F | Ht 68.0 in | Wt 231.0 lb

## 2018-09-22 DIAGNOSIS — I714 Abdominal aortic aneurysm, without rupture, unspecified: Secondary | ICD-10-CM

## 2018-09-22 DIAGNOSIS — I10 Essential (primary) hypertension: Secondary | ICD-10-CM | POA: Diagnosis not present

## 2018-09-22 DIAGNOSIS — E1151 Type 2 diabetes mellitus with diabetic peripheral angiopathy without gangrene: Secondary | ICD-10-CM | POA: Diagnosis not present

## 2018-09-22 DIAGNOSIS — Z6835 Body mass index (BMI) 35.0-35.9, adult: Secondary | ICD-10-CM | POA: Diagnosis not present

## 2018-09-23 ENCOUNTER — Other Ambulatory Visit: Payer: Self-pay | Admitting: Family Medicine

## 2018-09-23 DIAGNOSIS — I771 Stricture of artery: Secondary | ICD-10-CM

## 2018-09-23 DIAGNOSIS — M549 Dorsalgia, unspecified: Secondary | ICD-10-CM

## 2018-09-24 ENCOUNTER — Encounter: Payer: Self-pay | Admitting: Podiatry

## 2018-09-24 ENCOUNTER — Ambulatory Visit: Payer: Medicare Other | Admitting: Podiatry

## 2018-09-24 DIAGNOSIS — M79675 Pain in left toe(s): Secondary | ICD-10-CM

## 2018-09-24 DIAGNOSIS — L84 Corns and callosities: Secondary | ICD-10-CM

## 2018-09-24 DIAGNOSIS — E1142 Type 2 diabetes mellitus with diabetic polyneuropathy: Secondary | ICD-10-CM | POA: Diagnosis not present

## 2018-09-24 DIAGNOSIS — M79674 Pain in right toe(s): Secondary | ICD-10-CM

## 2018-09-24 DIAGNOSIS — B351 Tinea unguium: Secondary | ICD-10-CM

## 2018-09-24 NOTE — Patient Instructions (Signed)

## 2018-09-24 NOTE — Progress Notes (Signed)
Subjective: [name] presents today for preventative diabetic foot care.  He relates painful thick yellow nails which are aggravated with enclosed shoe gear.  He also has neuropathy.  Objective: Neurovascular status is unchanged. Diminished sharp dull vibratory sensation Toenails 1 through 5 bilaterally noted to be thickened, dystrophic, discolored with subungual debris.  There is pain on palpation to toenails.  Hyperkeratotic lesions plantar medial IPJ of the hallux bilaterally.  No edema, no erythema, no drainage, no impending wound.  Assessment: 1.  Painful mycotic toenails 1 through 5 bilaterally and patient with diabetes and peripheral neuropathy. 2.  Callus bilateral hallux and patient with diabetes and peripheral neuropathy  Plan: 1. Continue diabetic foot care principles.  Literature dispensed on today. 2.  Mycotic toenails debrided in length and girth 1 through 5 bilaterally with no iatrogenic bleeding. 3.  Calluses debrided x2 4.  Continue soft supportive shoe gear daily.  Report any pedal injuries to medical professional immediately.  Follow-up 3 months.  Patient to call should there be any concerns in the interim.

## 2018-09-25 ENCOUNTER — Encounter (INDEPENDENT_AMBULATORY_CARE_PROVIDER_SITE_OTHER): Payer: Self-pay | Admitting: Bariatrics

## 2018-09-25 DIAGNOSIS — L570 Actinic keratosis: Secondary | ICD-10-CM | POA: Diagnosis not present

## 2018-09-25 DIAGNOSIS — C44629 Squamous cell carcinoma of skin of left upper limb, including shoulder: Secondary | ICD-10-CM | POA: Diagnosis not present

## 2018-09-25 DIAGNOSIS — L409 Psoriasis, unspecified: Secondary | ICD-10-CM | POA: Diagnosis not present
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
## 2018-09-30 ENCOUNTER — Encounter: Payer: Self-pay | Admitting: Family Medicine

## 2018-10-07 DIAGNOSIS — M545 Low back pain: Secondary | ICD-10-CM | POA: Diagnosis not present

## 2018-10-07 DIAGNOSIS — I10 Essential (primary) hypertension: Secondary | ICD-10-CM | POA: Diagnosis not present

## 2018-10-07 DIAGNOSIS — M5126 Other intervertebral disc displacement, lumbar region: Secondary | ICD-10-CM | POA: Diagnosis not present

## 2018-10-08 ENCOUNTER — Encounter (INDEPENDENT_AMBULATORY_CARE_PROVIDER_SITE_OTHER): Payer: Self-pay | Admitting: Bariatrics

## 2018-10-08 ENCOUNTER — Encounter (INDEPENDENT_AMBULATORY_CARE_PROVIDER_SITE_OTHER): Payer: Self-pay

## 2018-10-08 ENCOUNTER — Ambulatory Visit (INDEPENDENT_AMBULATORY_CARE_PROVIDER_SITE_OTHER): Payer: Medicare Other | Admitting: Bariatrics

## 2018-10-08 VITALS — BP 151/73 | HR 72 | Temp 97.5°F | Ht 68.0 in | Wt 230.0 lb

## 2018-10-08 DIAGNOSIS — Z6835 Body mass index (BMI) 35.0-35.9, adult: Secondary | ICD-10-CM | POA: Diagnosis not present

## 2018-10-08 DIAGNOSIS — I10 Essential (primary) hypertension: Secondary | ICD-10-CM | POA: Diagnosis not present

## 2018-10-08 DIAGNOSIS — F3289 Other specified depressive episodes: Secondary | ICD-10-CM | POA: Diagnosis not present

## 2018-10-08 DIAGNOSIS — E119 Type 2 diabetes mellitus without complications: Secondary | ICD-10-CM

## 2018-10-08 MED ORDER — BUPROPION HCL ER (SR) 150 MG PO TB12: 150.0000 mg | ORAL_TABLET | Freq: Every day | ORAL | 0 refills | Status: DC

## 2018-10-08 NOTE — Telephone Encounter (Signed)
Gwen- can you check on this?

## 2018-10-09 ENCOUNTER — Ambulatory Visit: Payer: Medicare Other | Admitting: Family Medicine

## 2018-10-10 ENCOUNTER — Other Ambulatory Visit: Payer: Self-pay | Admitting: Family Medicine

## 2018-10-12 ENCOUNTER — Encounter (INDEPENDENT_AMBULATORY_CARE_PROVIDER_SITE_OTHER): Payer: Self-pay | Admitting: Bariatrics
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
## 2018-10-16 ENCOUNTER — Ambulatory Visit: Payer: Self-pay | Admitting: Cardiovascular Disease

## 2018-10-16 ENCOUNTER — Ambulatory Visit (INDEPENDENT_AMBULATORY_CARE_PROVIDER_SITE_OTHER): Payer: Medicare Other | Admitting: Family Medicine

## 2018-10-16 VITALS — BP 118/60 | HR 71 | Temp 97.8°F | Resp 18 | Ht 68.0 in | Wt 232.6 lb

## 2018-10-16 DIAGNOSIS — I771 Stricture of artery: Secondary | ICD-10-CM | POA: Diagnosis not present

## 2018-10-16 DIAGNOSIS — I10 Essential (primary) hypertension: Secondary | ICD-10-CM

## 2018-10-16 DIAGNOSIS — I701 Atherosclerosis of renal artery: Secondary | ICD-10-CM

## 2018-10-16 DIAGNOSIS — G609 Hereditary and idiopathic neuropathy, unspecified: Secondary | ICD-10-CM

## 2018-10-16 DIAGNOSIS — E1149 Type 2 diabetes mellitus with other diabetic neurological complication: Secondary | ICD-10-CM

## 2018-10-16 MED ORDER — PREGABALIN 150 MG PO CAPS: 150.0000 mg | ORAL_CAPSULE | Freq: Three times a day (TID) | ORAL | 0 refills | Status: DC

## 2018-10-16 MED ORDER — CARVEDILOL 25 MG PO TABS: ORAL_TABLET | ORAL | 1 refills | Status: DC

## 2018-10-16 MED ORDER — GLIMEPIRIDE 4 MG PO TABS: 4.0000 mg | ORAL_TABLET | Freq: Two times a day (BID) | ORAL | 1 refills | Status: DC

## 2018-10-16 NOTE — Assessment & Plan Note (Signed)
Is not responding well to the Gabapentin will try course of Lyrica 150 mg tid

## 2018-10-16 NOTE — Patient Instructions (Signed)
Capsaicin cream to feet Peripheral Neuropathy Peripheral neuropathy is a type of nerve damage. It affects nerves that carry signals between the spinal cord and other parts of the body. These are called peripheral nerves. With peripheral neuropathy, one nerve or a group of nerves may be damaged. What are the causes? Many things can damage peripheral nerves. For some people with peripheral neuropathy, the cause is unknown. Some causes include:  Diabetes. This is the most common cause of peripheral neuropathy.  Injury to a nerve.  Pressure or stress on a nerve that lasts a long time.  Too little vitamin B. Alcoholism can lead to this.  Infections.  Autoimmune diseases, such as multiple sclerosis and systemic lupus erythematosus.  Inherited nerve diseases.  Some medicines, such as cancer drugs.  Toxic substances, such as lead and mercury.  Too little blood flowing to the legs.  Kidney disease.  Thyroid disease.  What are the signs or symptoms? Different people have different symptoms. The symptoms you have will depend on which of your nerves is damaged. Common symptoms include:  Loss of feeling (numbness) in the feet and hands.  Tingling in the feet and hands.  Pain that burns.  Very sensitive skin.  Weakness.  Not being able to move a part of the body (paralysis).  Muscle twitching.  Clumsiness or poor coordination.  Loss of balance.  Not being able to control your bladder.  Feeling dizzy.  Sexual problems.  How is this diagnosed? Peripheral neuropathy is a symptom, not a disease. Finding the cause of peripheral neuropathy can be hard. To figure that out, your health care provider will take a medical history and do a physical exam. A neurological exam will also be done. This involves checking things affected by your brain, spinal cord, and nerves (nervous system). For example, your health care provider will check your reflexes, how you move, and what you can  feel. Other types of tests may also be ordered, such as:  Blood tests.  A test of the fluid in your spinal cord.  Imaging tests, such as CT scans or an MRI.  Electromyography (EMG). This test checks the nerves that control muscles.  Nerve conduction velocity tests. These tests check how fast messages pass through your nerves.  Nerve biopsy. A small piece of nerve is removed. It is then checked under a microscope.  How is this treated?  Medicine is often used to treat peripheral neuropathy. Medicines may include: ? Pain-relieving medicines. Prescription or over-the-counter medicine may be suggested. ? Antiseizure medicine. This may be used for pain. ? Antidepressants. These also may help ease pain from neuropathy. ? Lidocaine. This is a numbing medicine. You might wear a patch or be given a shot. ? Mexiletine. This medicine is typically used to help control irregular heart rhythms.  Surgery. Surgery may be needed to relieve pressure on a nerve or to destroy a nerve that is causing pain.  Physical therapy to help movement.  Assistive devices to help movement. Follow these instructions at home:  Only take over-the-counter or prescription medicines as directed by your health care provider. Follow the instructions carefully for any given medicines. Do not take any other medicines without first getting approval from your health care provider.  If you have diabetes, work closely with your health care provider to keep your blood sugar under control.  If you have numbness in your feet: ? Check every day for signs of injury or infection. Watch for redness, warmth, and swelling. ? Wear  padded socks and comfortable shoes. These help protect your feet.  Do not do things that put pressure on your damaged nerve.  Do not smoke. Smoking keeps blood from getting to damaged nerves.  Avoid or limit alcohol. Too much alcohol can cause a lack of B vitamins. These vitamins are needed for healthy  nerves.  Develop a good support system. Coping with peripheral neuropathy can be stressful. Talk to a mental health specialist or join a support group if you are struggling.  Follow up with your health care provider as directed. Contact a health care provider if:  You have new signs or symptoms of peripheral neuropathy.  You are struggling emotionally from dealing with peripheral neuropathy.  You have a fever. Get help right away if:  You have an injury or infection that is not healing.  You feel very dizzy or begin vomiting.  You have chest pain.  You have trouble breathing. This information is not intended to replace advice given to you by your health care provider. Make sure you discuss any questions you have with your health care provider. Document Released: 10/12/2002 Document Revised: 03/29/2016 Document Reviewed: 06/29/2013 Elsevier Interactive Patient Education  2017 [employer].

## 2018-10-16 NOTE — Assessment & Plan Note (Signed)
Well controlled, no changes to meds. Encouraged heart healthy diet such as the DASH diet and exercise as tolerated.  °

## 2018-10-16 NOTE — Assessment & Plan Note (Signed)
hgba1c acceptable, minimize simple carbs. Increase exercise as tolerated.  

## 2018-10-17 ENCOUNTER — Other Ambulatory Visit: Payer: Self-pay | Admitting: Student

## 2018-10-17 DIAGNOSIS — M5126 Other intervertebral disc displacement, lumbar region: Secondary | ICD-10-CM

## 2018-10-19 DIAGNOSIS — I771 Stricture of artery: Secondary | ICD-10-CM | POA: Insufficient documentation

## 2018-10-19 DIAGNOSIS — I701 Atherosclerosis of renal artery: Secondary | ICD-10-CM | POA: Insufficient documentation

## 2018-10-19 HISTORY — DX: Stricture of artery: I77.1

## 2018-10-19 HISTORY — DX: Atherosclerosis of renal artery: I70.1

## 2018-10-19 NOTE — Assessment & Plan Note (Signed)
Referred to vascular surgery for further consideration
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
## 2018-10-19 NOTE — Assessment & Plan Note (Signed)
Stable and bilateral

## 2018-10-22 ENCOUNTER — Encounter (INDEPENDENT_AMBULATORY_CARE_PROVIDER_SITE_OTHER): Payer: Self-pay | Admitting: Bariatrics

## 2018-10-22 ENCOUNTER — Ambulatory Visit (INDEPENDENT_AMBULATORY_CARE_PROVIDER_SITE_OTHER): Payer: Medicare Other | Admitting: Bariatrics

## 2018-10-22 VITALS — BP 125/55 | HR 74 | Temp 97.6°F | Ht 68.0 in | Wt 226.0 lb

## 2018-10-22 DIAGNOSIS — E119 Type 2 diabetes mellitus without complications: Secondary | ICD-10-CM

## 2018-10-22 DIAGNOSIS — Z6834 Body mass index (BMI) 34.0-34.9, adult: Secondary | ICD-10-CM

## 2018-10-22 DIAGNOSIS — E669 Obesity, unspecified: Secondary | ICD-10-CM | POA: Diagnosis not present

## 2018-10-22 DIAGNOSIS — Z9189 Other specified personal risk factors, not elsewhere classified: Secondary | ICD-10-CM

## 2018-10-22 DIAGNOSIS — I10 Essential (primary) hypertension: Secondary | ICD-10-CM | POA: Diagnosis not present

## 2018-10-22 DIAGNOSIS — F3289 Other specified depressive episodes: Secondary | ICD-10-CM

## 2018-10-22 MED ORDER — BUPROPION HCL ER (SR) 150 MG PO TB12: 150.0000 mg | ORAL_TABLET | Freq: Every day | ORAL | 0 refills | Status: DC
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
## 2018-10-28 ENCOUNTER — Ambulatory Visit: Payer: Medicare Other | Admitting: Cardiovascular Disease

## 2018-10-28 ENCOUNTER — Encounter: Payer: Self-pay | Admitting: Cardiovascular Disease

## 2018-10-28 DIAGNOSIS — E785 Hyperlipidemia, unspecified: Secondary | ICD-10-CM

## 2018-10-28 DIAGNOSIS — I10 Essential (primary) hypertension: Secondary | ICD-10-CM | POA: Diagnosis not present

## 2018-10-28 DIAGNOSIS — Z8679 Personal history of other diseases of the circulatory system: Secondary | ICD-10-CM

## 2018-10-28 DIAGNOSIS — I771 Stricture of artery: Secondary | ICD-10-CM | POA: Diagnosis not present

## 2018-10-28 NOTE — Progress Notes (Signed)
10/28/2018 [name]   1946-01-27  [id number]  Primary Physician [name], MD Primary Cardiologist: Lorretta Harp MD [name], [place]  HPI:  [name] is a 71 y.o. moderately overweight married Caucasian male father of 27, grandfather of 6 grandchildren who is accompanied by his wife [name] today.  He was referred by [name] for peripheral vascular evaluation because of a recent CTA that showed a 4 cm abdominal aortic aneurysm and left iliac stenosis.  He does have a history of 60-pack-year tobacco abuse having quit 2008.  He also has treated hypertension, diabetes and hyperlipidemia.  He had a stroke back in 2007 but is never had a heart attack.  Does complain of some dyspnea probably related to COPD but denies chest pain.  He has a known abdominal aortic aneurysm, renal artery stenosis and left iliac stenosis.  Abdominal ultrasound performed 10/01/2016 did show a moderately severe left common iliac artery stenosis with a aortic dimension of 3.5 x 3.7 cm.  Recent CTA performed 09/18/2018 revealed an aortic dimension of 3.7 x 4 cm with what appeared to be a left common iliac artery stenosis.  Does have some left lower extremity discomfort with exercise consistent with claudication but this does not appear to be lifestyle limiting at this time.  Current Meds  Medication Sig  . amLODipine (NORVASC) 5 MG tablet TAKE 1 TABLET BY MOUTH  DAILY  . Ascorbic Acid (VITAMIN C) 1000 MG tablet Take 1,000 mg by mouth daily.  [name] aspirin EC 81 MG tablet Take 81 mg by mouth at bedtime.  [name] atorvastatin (LIPITOR) 80 MG tablet TAKE 1 TABLET BY MOUTH  EVERY DAY AT 6PM  . buPROPion (WELLBUTRIN SR) 150 MG 12 hr tablet Take 1 tablet (150 mg total) by mouth daily.  . carvedilol (COREG) 25 MG tablet TAKE 1/2 TABLET BY MOUTH  TWICE A DAY WITH MEALS  . cholecalciferol (VITAMIN D) 1000 UNITS tablet Take 1,000 Units by mouth daily.  . cloNIDine (CATAPRES) 0.2 MG tablet TAKE ONE-HALF TABLET BY   MOUTH TWO TIMES DAILY  . glimepiride (AMARYL) 4 MG tablet Take 1 tablet (4 mg total) by mouth 2 (two) times daily.  . hydrochlorothiazide (MICROZIDE) 12.5 MG capsule TAKE 1 CAPSULE BY MOUTH  DAILY  . HYDROcodone-Acetaminophen 10-325 MG/15ML SOLN Take 0.5 tablets by mouth as needed.  . latanoprost (XALATAN) 0.005 % ophthalmic solution Place 1 drop into both eyes at bedtime.  [name] losartan (COZAAR) 50 MG tablet TAKE 1 TABLET BY MOUTH  DAILY  . Magnesium 500 MG TABS Take 500 mg by mouth daily.  . metFORMIN (GLUCOPHAGE) 1000 MG tablet TAKE 1 TABLET BY MOUTH  EVERY MORNING, 1/2 TABLET  AT LUNCH AND 1 TABLET EVERY EVENING. (Patient taking differently: Take 1,000 mg by mouth at bedtime. )  . Multiple Vitamin (MULTIVITAMIN) tablet Take 1 tablet by mouth daily.    . Omega-3 Fatty Acids (FISH OIL) 1000 MG CAPS Take 1,000 mg by mouth 2 (two) times daily.  . pregabalin (LYRICA) 150 MG capsule Take 1 capsule (150 mg total) by mouth 3 (three) times daily.  [name] PROAIR HFA 108 (90 Base) MCG/ACT inhaler USE 2 PUFFS EVERY 6 HOURS  AS NEEDED FOR WHEEZING OR  SHORTNESS OF BREATH  . vitamin B-12 (CYANOCOBALAMIN) 1000 MCG tablet Take 1,000 mcg by mouth 2 (two) times a week. Monday and Thursday     Allergies  Allergen Reactions  . No Known Allergies  Social History   Socioeconomic History  . Marital status: Married    Spouse name: [name]  . Number of children: 3  . Years of education: 12th grade  . Highest education level: Not on file  Occupational History  . Occupation: Maintenance    Comment: Primary Care at [employer]  . Financial resource strain: Not on file  . Food insecurity:    Worry: Not on file    Inability: Not on file  . Transportation needs:    Medical: Not on file    Non-medical: Not on file  Tobacco Use  . Smoking status: Former Smoker    Packs/day: 2.00    Years: 40.00    Pack years: 80.00    Types: Cigarettes, Cigars    Last attempt to quit: 05/04/2006    Years since  quitting: 12.4  . Smokeless tobacco: Never Used  Substance and Sexual Activity  . Alcohol use: Yes    Alcohol/week: 0.0 standard drinks    Comment: rare - 11/10/2012 "quit > 20 yr ago"  . Drug use: No  . Sexual activity: Never  Lifestyle  . Physical activity:    Days per week: Not on file    Minutes per session: Not on file  . Stress: Not on file  Relationships  . Social connections:    Talks on phone: Not on file    Gets together: Not on file    Attends religious service: Not on file    Active member of club or organization: Not on file    Attends meetings of clubs or organizations: Not on file    Relationship status: Not on file  . Intimate partner violence:    Fear of current or ex partner: Not on file    Emotionally abused: Not on file    Physically abused: Not on file    Forced sexual activity: Not on file  Other Topics Concern  . Not on file  Social History Narrative   Lives with wife (1993), no pets   Grown children.   Occupation: retired, Games developer, [employer])   Activity: golf, gardening    Diet: good water, fruits/vegetables daily     Review of Systems: General: negative for chills, fever, night sweats or weight changes.  Cardiovascular: negative for chest pain, dyspnea on exertion, edema, orthopnea, palpitations, paroxysmal nocturnal dyspnea or shortness of breath Dermatological: negative for rash Respiratory: negative for cough or wheezing Urologic: negative for hematuria Abdominal: negative for nausea, vomiting, diarrhea, bright red blood per rectum, melena, or hematemesis Neurologic: negative for visual changes, syncope, or dizziness All other systems reviewed and are otherwise negative except as noted above.    Blood pressure 124/60, pulse 72, height 5\' 9"  (1.753 m), weight 227 lb (103 kg).  General appearance: alert and no distress Neck: no adenopathy, no carotid bruit, no JVD, supple, symmetrical, trachea midline and thyroid not  enlarged, symmetric, no tenderness/mass/nodules Lungs: clear to auscultation bilaterally Heart: regular rate and rhythm, S1, S2 normal, no murmur, click, rub or gallop Extremities: extremities normal, atraumatic, no cyanosis or edema Pulses: 2+ and symmetric Skin: Skin color, texture, turgor normal. No rashes or lesions Neurologic: Alert and oriented X 3, normal strength and tone. Normal symmetric reflexes. Normal coordination and gait  EKG not performed today  ASSESSMENT AND PLAN:   History of abdominal aortic aneurysm (AAA) History of abdominal aortic aneurysm with recent CTA from 09/18/2018 revealing a transverse diameter of 3.7 x 4 cm which was  stable compared to prior studies.  We will recheck abdominal ultrasound on annual basis.  Iliac artery stenosis, left (HCC) Recent abdominal CTA performed 09/18/2018 did suggest a left iliac stenosis.  In addition, he had Doppler studies performed in our office 09/15/2016 revealing a moderate left common iliac artery stenosis.  He does complain of some left lower extremity claudication but this is does not appear to be lifestyle limiting.  We will continue to follow this on annual basis.  Hyperlipidemia History of hyperlipidemia on statin therapy with lipid profile performed 05/22/2018 revealing total cholesterol of 149, LDL 77 and HDL of 34.  Essential hypertension History of essential hypertension with blood pressure measured today at 124/60.  He is on amlodipine, carvedilol, losartan and hydrochlorothiazide.  Continue current meds at current dosing.      Lorretta Harp MD FACP,FACC,FAHA, [facility] 10/28/2018 10:38 AM

## 2018-10-28 NOTE — Assessment & Plan Note (Signed)
History of abdominal aortic aneurysm with recent CTA from 09/18/2018 revealing a transverse diameter of 3.7 x 4 cm which was stable compared to prior studies.  We will recheck abdominal ultrasound on annual basis.

## 2018-10-28 NOTE — Patient Instructions (Signed)
Medication Instructions:  Your physician recommends that you continue on your current medications as directed. Please refer to the Current Medication list given to you today. If you need a refill on your cardiac medications before your next appointment, please call your pharmacy.   Lab work: NONE If you have labs (blood work) drawn today and your tests are completely normal, you will receive your results only by: [name] MyChart Message (if you have MyChart) OR . A paper copy in the mail If you have any lab test that is abnormal or we need to change your treatment, we will call you to review the results.  Testing/Procedures: Your physician has requested that you have an aorta/iliac duplex. During this test, an ultrasound is used to evaluate blood flow to the aorta and iliac arteries. Allow one hour for this exam. Do not eat after midnight the day before and avoid carbonated beverages.  SCHEDULE BEFORE   Your physician has requested that you have an abdominal aorta duplex. During this test, an ultrasound is used to evaluate the aorta. Allow 30 minutes for this exam. Do not eat after midnight the day before and avoid carbonated beverages.  SCHEDULE BEFORE  Follow-Up: At [facility], you and your health needs are our priority.  As part of our continuing mission to provide you with exceptional heart care, we have created designated Provider Care Teams.  These Care Teams include your primary Cardiologist (physician) and Advanced Practice Providers (APPs -  Physician Assistants and Nurse Practitioners) who all work together to provide you with the care you need, when you need it. You will need a follow up appointment in 12 months.  Please call our office 2 months in advance to schedule this appointment.  You may see DR. BERRY or one of the following Advanced Practice Providers on your designated Care Team:   Kerin Ransom, PA-C [place], [place] . Sande Rives, PA-C

## 2018-10-28 NOTE — Assessment & Plan Note (Signed)
History of hyperlipidemia on statin therapy with lipid profile performed 05/22/2018 revealing total cholesterol of 149, LDL 77 and HDL of 34.

## 2018-10-28 NOTE — Assessment & Plan Note (Signed)
History of essential hypertension with blood pressure measured today at 124/60.  He is on amlodipine, carvedilol, losartan and hydrochlorothiazide.  Continue current meds at current dosing.

## 2018-10-28 NOTE — Assessment & Plan Note (Signed)
Recent abdominal CTA performed 09/18/2018 did suggest a left iliac stenosis.  In addition, he had Doppler studies performed in our office 09/15/2016 revealing a moderate left common iliac artery stenosis.  He does complain of some left lower extremity claudication but this is does not appear to be lifestyle limiting.  We will continue to follow this on annual basis.

## 2018-10-30 ENCOUNTER — Encounter: Payer: Self-pay | Admitting: Family Medicine

## 2018-10-30 ENCOUNTER — Telehealth: Payer: Self-pay

## 2018-10-30 NOTE — Telephone Encounter (Signed)
Copied from [place] [id number]. Topic: General - Other >> Oct 30, 2018  1:03 PM Yvette Rack wrote: Reason for CRM: Drue Dun from [facility] [phone] calling to speak with referrals

## 2018-11-03 MED ORDER — PREGABALIN 150 MG PO CAPS: 150.0000 mg | ORAL_CAPSULE | Freq: Three times a day (TID) | ORAL | 0 refills | Status: DC

## 2018-11-03 NOTE — Telephone Encounter (Signed)
Can you resend to this new pharmacy   Please advise

## 2018-11-03 NOTE — Telephone Encounter (Signed)
Never wrote for the brand name, did not write it as DAW so the pharmacy is supposed to offer the generic. OK to rewrite prescription if they need that written out some way. Will try and send over what is loaded but they may say the same thing

## 2018-11-04 ENCOUNTER — Ambulatory Visit: Payer: Self-pay | Admitting: *Deleted

## 2018-11-04 NOTE — Telephone Encounter (Signed)
[facility] [phone] order# [id number] Calling to see if pt still take the pregabalin (LYRICA) 150 MG capsule And gabapentin (NEURONTIN) 300 MG capsule    Returned call to [facility] regarding the medications gabapentin and lyrica.  Gabapentin was discontinued on 10/16/18 and Lyrica was ordered on that day.

## 2018-11-07 ENCOUNTER — Ambulatory Visit (INDEPENDENT_AMBULATORY_CARE_PROVIDER_SITE_OTHER): Payer: Medicare Other

## 2018-11-07 ENCOUNTER — Other Ambulatory Visit: Payer: Self-pay

## 2018-11-07 ENCOUNTER — Ambulatory Visit (INDEPENDENT_AMBULATORY_CARE_PROVIDER_SITE_OTHER): Payer: Medicare Other | Admitting: Family Medicine

## 2018-11-07 ENCOUNTER — Encounter: Payer: Self-pay | Admitting: Family Medicine

## 2018-11-07 VITALS — BP 144/66 | HR 77 | Temp 98.7°F | Resp 16 | Ht 69.0 in | Wt 228.2 lb

## 2018-11-07 DIAGNOSIS — S40011A Contusion of right shoulder, initial encounter: Secondary | ICD-10-CM

## 2018-11-07 DIAGNOSIS — M25511 Pain in right shoulder: Secondary | ICD-10-CM

## 2018-11-07 DIAGNOSIS — S46811A Strain of other muscles, fascia and tendons at shoulder and upper arm level, right arm, initial encounter: Secondary | ICD-10-CM

## 2018-11-07 DIAGNOSIS — M19011 Primary osteoarthritis, right shoulder: Secondary | ICD-10-CM | POA: Diagnosis not present

## 2018-11-07 DIAGNOSIS — M898X1 Other specified disorders of bone, shoulder: Secondary | ICD-10-CM

## 2018-11-07 DIAGNOSIS — S4991XA Unspecified injury of right shoulder and upper arm, initial encounter: Secondary | ICD-10-CM | POA: Diagnosis not present

## 2018-11-07 DIAGNOSIS — S40021A Contusion of right upper arm, initial encounter: Secondary | ICD-10-CM | POA: Diagnosis not present

## 2018-11-07 MED ORDER — HYDROCODONE-ACETAMINOPHEN 5-325 MG PO TABS: 1.0000 | ORAL_TABLET | Freq: Four times a day (QID) | ORAL | 0 refills | Status: DC | PRN
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
## 2018-11-07 NOTE — Patient Instructions (Addendum)
Although there were no reported broken bones on shoulder or collarbone, I am suspicious for possible rotator cuff injury based on your exam.  I also suspect you have a strain of the upper back/neck muscle and possible strain of the chest muscle as well.    For now use the sling, hydrocodone if needed for pain, ice over area (on and off for 10 minutes to 15 minutes at a time) for the next few days as tolerated, and I will refer you to orthopedics.  Let me know if there are questions prior to seeing Ortho.  Return to the clinic or go to the nearest emergency room if any of your symptoms worsen or new symptoms occur.    If you have lab work done today you will be contacted with your lab results within the next 2 weeks.  If you have not heard from [name] then please contact us. The fastest way to get your results is to register for My Chart.   IF you received an x-ray today, you will receive an invoice from [facility]. Please contact [facility] at [phone] with questions or concerns regarding your invoice.   IF you received labwork today, you will receive an invoice from [facility]. Please contact [facility] at [phone] with questions or concerns regarding your invoice.   Our billing staff will not be able to assist you with questions regarding bills from these companies.  You will be contacted with the lab results as soon as they are available. The fastest way to get your results is to activate your My Chart account. Instructions are located on the last page of this paperwork. If you have not heard from [name] regarding the results in 2 weeks, please contact this office.

## 2018-11-12 ENCOUNTER — Ambulatory Visit (INDEPENDENT_AMBULATORY_CARE_PROVIDER_SITE_OTHER): Payer: Medicare Other | Admitting: Bariatrics

## 2018-11-12 ENCOUNTER — Encounter (INDEPENDENT_AMBULATORY_CARE_PROVIDER_SITE_OTHER): Payer: Self-pay | Admitting: Bariatrics

## 2018-11-12 VITALS — BP 124/68 | HR 76 | Temp 98.0°F | Ht 69.0 in | Wt 214.0 lb

## 2018-11-12 DIAGNOSIS — E119 Type 2 diabetes mellitus without complications: Secondary | ICD-10-CM | POA: Diagnosis not present

## 2018-11-12 DIAGNOSIS — Z6832 Body mass index (BMI) 32.0-32.9, adult: Secondary | ICD-10-CM | POA: Diagnosis not present

## 2018-11-12 DIAGNOSIS — E669 Obesity, unspecified: Secondary | ICD-10-CM

## 2018-11-12 DIAGNOSIS — I10 Essential (primary) hypertension: Secondary | ICD-10-CM

## 2018-11-13 ENCOUNTER — Other Ambulatory Visit (HOSPITAL_COMMUNITY): Payer: Medicare Other

## 2018-11-13 ENCOUNTER — Ambulatory Visit
Admission: RE | Admit: 2018-11-13 | Discharge: 2018-11-13 | Disposition: A | Discharge status: Home / Self Care | Payer: Medicare Other | Source: Ambulatory Visit | Attending: Student | Admitting: Student

## 2018-11-13 DIAGNOSIS — M48061 Spinal stenosis, lumbar region without neurogenic claudication: Secondary | ICD-10-CM | POA: Diagnosis not present

## 2018-11-13 DIAGNOSIS — M5126 Other intervertebral disc displacement, lumbar region: Secondary | ICD-10-CM
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
## 2018-11-14 ENCOUNTER — Other Ambulatory Visit: Payer: Self-pay | Admitting: Cardiovascular Disease

## 2018-11-14 DIAGNOSIS — I714 Abdominal aortic aneurysm, without rupture, unspecified: Secondary | ICD-10-CM

## 2018-11-17 DIAGNOSIS — M25511 Pain in right shoulder: Secondary | ICD-10-CM | POA: Diagnosis not present

## 2018-11-19 ENCOUNTER — Encounter: Payer: Self-pay | Admitting: Family Medicine

## 2018-11-20 ENCOUNTER — Ambulatory Visit (HOSPITAL_COMMUNITY)
Admission: RE | Admit: 2018-11-20 | Discharge: 2018-11-20 | Disposition: A | Discharge status: Home / Self Care | Payer: Medicare Other | Source: Ambulatory Visit | Attending: Cardiology | Admitting: Cardiology

## 2018-11-20 ENCOUNTER — Other Ambulatory Visit: Payer: Self-pay | Admitting: Family Medicine

## 2018-11-20 DIAGNOSIS — I714 Abdominal aortic aneurysm, without rupture, unspecified: Secondary | ICD-10-CM

## 2018-11-20 MED ORDER — PREGABALIN 150 MG PO CAPS: 150.0000 mg | ORAL_CAPSULE | Freq: Three times a day (TID) | ORAL | 1 refills | Status: DC

## 2018-11-24 DIAGNOSIS — L570 Actinic keratosis: Secondary | ICD-10-CM | POA: Diagnosis not present

## 2018-11-24 DIAGNOSIS — L409 Psoriasis, unspecified: Secondary | ICD-10-CM | POA: Diagnosis not present

## 2018-11-25 ENCOUNTER — Encounter: Payer: Self-pay | Admitting: Family Medicine

## 2018-11-26 ENCOUNTER — Encounter (INDEPENDENT_AMBULATORY_CARE_PROVIDER_SITE_OTHER): Payer: Self-pay | Admitting: Bariatrics

## 2018-11-26 ENCOUNTER — Ambulatory Visit (INDEPENDENT_AMBULATORY_CARE_PROVIDER_SITE_OTHER): Payer: Medicare Other | Admitting: Bariatrics

## 2018-11-26 VITALS — BP 147/72 | HR 72 | Temp 97.8°F | Ht 69.0 in | Wt 217.0 lb

## 2018-11-26 DIAGNOSIS — Z6832 Body mass index (BMI) 32.0-32.9, adult: Secondary | ICD-10-CM

## 2018-11-26 DIAGNOSIS — E669 Obesity, unspecified: Secondary | ICD-10-CM

## 2018-11-26 DIAGNOSIS — F3289 Other specified depressive episodes: Secondary | ICD-10-CM

## 2018-11-26 DIAGNOSIS — E119 Type 2 diabetes mellitus without complications: Secondary | ICD-10-CM

## 2018-11-26 MED ORDER — BUPROPION HCL ER (SR) 200 MG PO TB12: 200.0000 mg | ORAL_TABLET | Freq: Every day | ORAL | 0 refills | Status: DC

## 2018-11-26 NOTE — Telephone Encounter (Signed)
Copied from [place] [phone]. Topic: Quick Communication - See Telephone Encounter >> Nov 26, 2018  1:18 PM Vernona Rieger wrote: CRM for notification. See Telephone encounter for: 11/26/18.  [facility] stated they did not get the refill for pregabalin (LYRICA) 150 MG capsule. Please re-send. Thanks

## 2018-11-27 ENCOUNTER — Telehealth: Payer: Self-pay
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
## 2018-11-27 NOTE — Telephone Encounter (Signed)
Spoke w/ [name] at [email or web address] in [place]- verbal given on Lyrica that was sent on 11/20/2018 to [facility] in [place] by mistake. Verbal given for #270 and 1 refill.

## 2018-12-02 DIAGNOSIS — I10 Essential (primary) hypertension: Secondary | ICD-10-CM | POA: Diagnosis not present

## 2018-12-02 DIAGNOSIS — G8929 Other chronic pain: Secondary | ICD-10-CM | POA: Diagnosis not present

## 2018-12-02 DIAGNOSIS — M5416 Radiculopathy, lumbar region: Secondary | ICD-10-CM | POA: Diagnosis not present

## 2018-12-09 ENCOUNTER — Ambulatory Visit (INDEPENDENT_AMBULATORY_CARE_PROVIDER_SITE_OTHER): Payer: Medicare Other | Admitting: Family Medicine

## 2018-12-09 ENCOUNTER — Ambulatory Visit (INDEPENDENT_AMBULATORY_CARE_PROVIDER_SITE_OTHER): Payer: Medicare Other

## 2018-12-09 VITALS — BP 106/66 | HR 67 | Resp 16 | Ht 69.0 in | Wt 214.0 lb

## 2018-12-09 DIAGNOSIS — R05 Cough: Secondary | ICD-10-CM

## 2018-12-09 DIAGNOSIS — D72829 Elevated white blood cell count, unspecified: Secondary | ICD-10-CM | POA: Diagnosis not present

## 2018-12-09 DIAGNOSIS — R42 Dizziness and giddiness: Secondary | ICD-10-CM | POA: Diagnosis not present

## 2018-12-09 DIAGNOSIS — R059 Cough, unspecified: Secondary | ICD-10-CM

## 2018-12-09 DIAGNOSIS — R55 Syncope and collapse: Secondary | ICD-10-CM | POA: Diagnosis not present

## 2018-12-09 DIAGNOSIS — E869 Volume depletion, unspecified: Secondary | ICD-10-CM | POA: Diagnosis not present

## 2018-12-09 LAB — POCT CBC
Granulocyte percent: 66.6 %G (ref 37–80)
HCT, POC: 38.9 % (ref 29–41)
Hemoglobin: 12.7 g/dL (ref 11–14.6)
Lymph, poc: 2.9 (ref 0.6–3.4)
MCH, POC: 30.2 pg (ref 27–31.2)
MCHC: 32.8 g/dL (ref 31.8–35.4)
MCV: 92.1 fL (ref 76–111)
MID (cbc): 0.5 (ref 0–0.9)
MPV: 7.2 fL (ref 0–99.8)
POC Granulocyte: 6.9 (ref 2–6.9)
POC LYMPH PERCENT: 28.1 %L (ref 10–50)
POC MID %: 5.3 %M (ref 0–12)
Platelet Count, POC: 278 10*3/uL (ref 142–424)
RBC: 4.22 M/uL — AB (ref 4.69–6.13)
RDW, POC: 14 %
WBC: 10.63 10*3/uL — AB (ref 4.6–10.2)

## 2018-12-09 LAB — GLUCOSE, POCT (MANUAL RESULT ENTRY): POC Glucose: 169 mg/dl — AB (ref 70–99)

## 2018-12-09 MED ORDER — AZITHROMYCIN 250 MG PO TABS: ORAL_TABLET | ORAL | 0 refills | Status: DC

## 2018-12-09 NOTE — Patient Instructions (Signed)
  I suspect most your symptoms today were due to volume depletion or dehydration.  Unfortunately the blood pressure medication you took this morning is still working so we will need to continue to push fluids when you are home today. Recheck in the next 24 to 48 hours, or if return of dizziness symptoms to be seen sooner here or the emergency room.  I did hear a small amount of possible congestion in your lungs which may be an early pneumonia.  Start azithromycin 2 pills today, then 1 pill/day for the next 4 days.  If needed, you can use Mucinex over-the-counter for cough.     If you have lab work done today you will be contacted with your lab results within the next 2 weeks.  If you have not heard from [name] then please contact us. The fastest way to get your results is to register for My Chart.   IF you received an x-ray today, you will receive an invoice from [facility]. Please contact [facility] at [phone] with questions or concerns regarding your invoice.   IF you received labwork today, you will receive an invoice from [facility]. Please contact [facility] at [phone] with questions or concerns regarding your invoice.   Our billing staff will not be able to assist you with questions regarding bills from these companies.  You will be contacted with the lab results as soon as they are available. The fastest way to get your results is to activate your My Chart account. Instructions are located on the last page of this paperwork. If you have not heard from [name] regarding the results in 2 weeks, please contact this office.
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
## 2018-12-10 ENCOUNTER — Encounter: Payer: Self-pay | Admitting: Family Medicine

## 2018-12-10 ENCOUNTER — Other Ambulatory Visit: Payer: Self-pay

## 2018-12-10 ENCOUNTER — Ambulatory Visit (INDEPENDENT_AMBULATORY_CARE_PROVIDER_SITE_OTHER): Payer: Medicare Other | Admitting: Family Medicine

## 2018-12-10 VITALS — BP 89/54 | HR 82 | Temp 98.4°F | Resp 12 | Ht 69.0 in | Wt 217.8 lb

## 2018-12-10 DIAGNOSIS — Z87898 Personal history of other specified conditions: Secondary | ICD-10-CM

## 2018-12-10 DIAGNOSIS — R059 Cough, unspecified: Secondary | ICD-10-CM

## 2018-12-10 DIAGNOSIS — R05 Cough: Secondary | ICD-10-CM | POA: Diagnosis not present

## 2018-12-10 DIAGNOSIS — I959 Hypotension, unspecified: Secondary | ICD-10-CM

## 2018-12-10 LAB — POCT CBC
Granulocyte percent: 64.8 %G (ref 37–80)
HCT, POC: 38.8 % (ref 29–41)
Hemoglobin: 13.1 g/dL (ref 11–14.6)
Lymph, poc: 2.5 (ref 0.6–3.4)
MCH, POC: 31.6 pg — AB (ref 27–31.2)
MCHC: 33.9 g/dL (ref 31.8–35.4)
MCV: 93.3 fL (ref 76–111)
MID (cbc): 0.7 (ref 0–0.9)
MPV: 6.6 fL (ref 0–99.8)
POC Granulocyte: 5.9 (ref 2–6.9)
POC LYMPH PERCENT: 28 %L (ref 10–50)
POC MID %: 7.2 %M (ref 0–12)
Platelet Count, POC: 269 10*3/uL (ref 142–424)
RBC: 4.16 M/uL — AB (ref 4.69–6.13)
RDW, POC: 13.6 %
WBC: 9.1 10*3/uL (ref 4.6–10.2)
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
## 2018-12-10 NOTE — Patient Instructions (Addendum)
  Stop HCTZ for now. Check blood pressure tomorrow. Hold clonidine tomorrow morning, but keep a record of your blood pressures outside of the office and bring them to the next office visit in 2 days. We can look at med adjustments again in 2 days of needed.   Return to the clinic or go to the nearest emergency room if any of your symptoms worsen or new symptoms occur.  If you have lab work done today you will be contacted with your lab results within the next 2 weeks.  If you have not heard from [name] then please contact us. The fastest way to get your results is to register for My Chart.   IF you received an x-ray today, you will receive an invoice from [facility]. Please contact [facility] at [phone] with questions or concerns regarding your invoice.   IF you received labwork today, you will receive an invoice from [facility]. Please contact [facility] at [phone] with questions or concerns regarding your invoice.   Our billing staff will not be able to assist you with questions regarding bills from these companies.  You will be contacted with the lab results as soon as they are available. The fastest way to get your results is to activate your My Chart account. Instructions are located on the last page of this paperwork. If you have not heard from [name] regarding the results in 2 weeks, please contact this office.

## 2018-12-11 LAB — BASIC METABOLIC PANEL
BUN/Creatinine Ratio: 19 (ref 10–24)
BUN: 16 mg/dL (ref 8–27)
CO2: 24 mmol/L (ref 20–29)
Calcium: 9.6 mg/dL (ref 8.6–10.2)
Chloride: 99 mmol/L (ref 96–106)
Creatinine, Ser: 0.83 mg/dL (ref 0.76–1.27)
GFR calc Af Amer: 102 mL/min/{1.73_m2} (ref >59)
GFR calc non Af Amer: 89 mL/min/{1.73_m2} (ref >59)
Glucose: 114 mg/dL — ABNORMAL HIGH (ref 65–99)
Potassium: 4.8 mmol/L (ref 3.5–5.2)
Sodium: 140 mmol/L (ref 134–144)

## 2018-12-12 ENCOUNTER — Ambulatory Visit (INDEPENDENT_AMBULATORY_CARE_PROVIDER_SITE_OTHER): Payer: Medicare Other | Admitting: Emergency Medicine

## 2018-12-12 ENCOUNTER — Other Ambulatory Visit: Payer: Self-pay

## 2018-12-12 ENCOUNTER — Encounter: Payer: Self-pay | Admitting: Emergency Medicine

## 2018-12-12 VITALS — BP 112/67 | HR 91 | Temp 97.9°F | Resp 16 | Wt 218.2 lb

## 2018-12-12 DIAGNOSIS — Z87898 Personal history of other specified conditions: Secondary | ICD-10-CM

## 2018-12-12 DIAGNOSIS — I959 Hypotension, unspecified: Secondary | ICD-10-CM | POA: Diagnosis not present

## 2018-12-12 NOTE — Patient Instructions (Addendum)
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
## 2018-12-13 ENCOUNTER — Other Ambulatory Visit: Payer: Self-pay | Admitting: Family Medicine

## 2018-12-15 ENCOUNTER — Ambulatory Visit (INDEPENDENT_AMBULATORY_CARE_PROVIDER_SITE_OTHER): Payer: Medicare Other | Admitting: Bariatrics

## 2018-12-15 ENCOUNTER — Encounter (INDEPENDENT_AMBULATORY_CARE_PROVIDER_SITE_OTHER): Payer: Self-pay | Admitting: Bariatrics

## 2018-12-15 VITALS — BP 90/56 | HR 79 | Temp 97.9°F | Ht 69.0 in | Wt 211.0 lb

## 2018-12-15 DIAGNOSIS — F3289 Other specified depressive episodes: Secondary | ICD-10-CM | POA: Diagnosis not present

## 2018-12-15 DIAGNOSIS — I10 Essential (primary) hypertension: Secondary | ICD-10-CM

## 2018-12-15 DIAGNOSIS — E669 Obesity, unspecified: Secondary | ICD-10-CM | POA: Diagnosis not present

## 2018-12-15 DIAGNOSIS — E119 Type 2 diabetes mellitus without complications: Secondary | ICD-10-CM

## 2018-12-15 DIAGNOSIS — Z6831 Body mass index (BMI) 31.0-31.9, adult: Secondary | ICD-10-CM | POA: Diagnosis not present
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
## 2018-12-16 DIAGNOSIS — Z6831 Body mass index (BMI) 31.0-31.9, adult: Secondary | ICD-10-CM

## 2018-12-16 DIAGNOSIS — Z6832 Body mass index (BMI) 32.0-32.9, adult: Secondary | ICD-10-CM | POA: Insufficient documentation

## 2018-12-16 DIAGNOSIS — E669 Obesity, unspecified: Secondary | ICD-10-CM | POA: Insufficient documentation

## 2018-12-16 DIAGNOSIS — E66812 Obesity, class 2: Secondary | ICD-10-CM | POA: Insufficient documentation

## 2018-12-16 DIAGNOSIS — E6609 Other obesity due to excess calories: Secondary | ICD-10-CM | POA: Insufficient documentation

## 2018-12-16 HISTORY — DX: Obesity, unspecified: E66.9

## 2018-12-16 HISTORY — DX: Body mass index (BMI) 32.0-32.9, adult: Z68.32

## 2018-12-16 LAB — INSULIN, RANDOM: INSULIN: 15.3 u[IU]/mL (ref 2.6–24.9)

## 2018-12-16 LAB — HEMOGLOBIN A1C
Est. average glucose Bld gHb Est-mCnc: 134 mg/dL
Hgb A1c MFr Bld: 6.3 % — ABNORMAL HIGH (ref 4.8–5.6)

## 2018-12-18 DIAGNOSIS — H401131 Primary open-angle glaucoma, bilateral, mild stage: Secondary | ICD-10-CM | POA: Diagnosis not present

## 2018-12-22 ENCOUNTER — Other Ambulatory Visit: Payer: Self-pay | Admitting: Family Medicine

## 2018-12-22 ENCOUNTER — Other Ambulatory Visit (INDEPENDENT_AMBULATORY_CARE_PROVIDER_SITE_OTHER): Payer: Self-pay | Admitting: Bariatrics

## 2018-12-22 DIAGNOSIS — F3289 Other specified depressive episodes: Secondary | ICD-10-CM

## 2018-12-24 ENCOUNTER — Ambulatory Visit: Payer: Medicare Other | Admitting: Podiatry

## 2018-12-30 ENCOUNTER — Encounter (INDEPENDENT_AMBULATORY_CARE_PROVIDER_SITE_OTHER): Payer: Self-pay | Admitting: Bariatrics

## 2018-12-31 ENCOUNTER — Encounter (INDEPENDENT_AMBULATORY_CARE_PROVIDER_SITE_OTHER): Payer: Self-pay

## 2018-12-31 ENCOUNTER — Ambulatory Visit (INDEPENDENT_AMBULATORY_CARE_PROVIDER_SITE_OTHER): Payer: Medicare Other | Admitting: Bariatrics

## 2018-12-31 ENCOUNTER — Other Ambulatory Visit: Payer: Self-pay | Admitting: Family Medicine

## 2019-01-05 ENCOUNTER — Ambulatory Visit (INDEPENDENT_AMBULATORY_CARE_PROVIDER_SITE_OTHER): Payer: Medicare Other | Admitting: Physician Assistant

## 2019-01-05 VITALS — BP 113/65 | HR 68 | Temp 97.7°F | Ht 69.0 in | Wt 211.0 lb

## 2019-01-05 DIAGNOSIS — F3289 Other specified depressive episodes: Secondary | ICD-10-CM | POA: Diagnosis not present

## 2019-01-05 DIAGNOSIS — Z6831 Body mass index (BMI) 31.0-31.9, adult: Secondary | ICD-10-CM

## 2019-01-05 DIAGNOSIS — E119 Type 2 diabetes mellitus without complications: Secondary | ICD-10-CM

## 2019-01-05 DIAGNOSIS — E669 Obesity, unspecified: Secondary | ICD-10-CM

## 2019-01-05 MED ORDER — BUPROPION HCL ER (SR) 150 MG PO TB12: 150.0000 mg | ORAL_TABLET | Freq: Every day | ORAL | 0 refills | Status: DC
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
## 2019-01-15 ENCOUNTER — Ambulatory Visit (INDEPENDENT_AMBULATORY_CARE_PROVIDER_SITE_OTHER): Payer: Medicare Other | Admitting: Family Medicine

## 2019-01-15 ENCOUNTER — Other Ambulatory Visit: Payer: Self-pay

## 2019-01-15 DIAGNOSIS — I10 Essential (primary) hypertension: Secondary | ICD-10-CM

## 2019-01-15 DIAGNOSIS — E1149 Type 2 diabetes mellitus with other diabetic neurological complication: Secondary | ICD-10-CM | POA: Diagnosis not present

## 2019-01-15 DIAGNOSIS — E785 Hyperlipidemia, unspecified: Secondary | ICD-10-CM

## 2019-01-15 DIAGNOSIS — E669 Obesity, unspecified: Secondary | ICD-10-CM

## 2019-01-15 DIAGNOSIS — Z6831 Body mass index (BMI) 31.0-31.9, adult: Secondary | ICD-10-CM

## 2019-01-15 MED ORDER — AMLODIPINE BESYLATE 5 MG PO TABS: 5.0000 mg | ORAL_TABLET | Freq: Every day | ORAL | 1 refills | Status: DC

## 2019-01-15 NOTE — Patient Instructions (Addendum)
Drink 60 to 80 ounces of fluids daily  Move Amlodipine to bedtime. Monitor blood pressure. If numbers improved to mostly <140/90 no further changes If numbers continue to frequently run above stated numbers then increase the Carvedilol 25 mg 1/2 tab to three x daily and notify us so we can send in more meds  Hypertension Hypertension, commonly called high blood pressure, is when the force of blood pumping through the arteries is too strong. The arteries are the blood vessels that carry blood from the heart throughout the body. Hypertension forces the heart to work harder to pump blood and may cause arteries to become narrow or stiff. Having untreated or uncontrolled hypertension can cause heart attacks, strokes, kidney disease, and other problems. A blood pressure reading consists of a higher number over a lower number. Ideally, your blood pressure should be below 120/80. The first ("top") number is called the systolic pressure. It is a measure of the pressure in your arteries as your heart beats. The second ("bottom") number is called the diastolic pressure. It is a measure of the pressure in your arteries as the heart relaxes. What are the causes? The cause of this condition is not known. What increases the risk? Some risk factors for high blood pressure are under your control. Others are not. Factors you can change  Smoking.  Having type 2 diabetes mellitus, high cholesterol, or both.  Not getting enough exercise or physical activity.  Being overweight.  Having too much fat, sugar, calories, or salt (sodium) in your diet.  Drinking too much alcohol. Factors that are difficult or impossible to change  Having chronic kidney disease.  Having a family history of high blood pressure.  Age. Risk increases with age.  Race. You may be at higher risk if you are African-American.  Gender. Men are at higher risk than women before age 55. After age 60, women are at higher risk than men.   Having obstructive sleep apnea.  Stress. What are the signs or symptoms? Extremely high blood pressure (hypertensive crisis) may cause:  Headache.  Anxiety.  Shortness of breath.  Nosebleed.  Nausea and vomiting.  Severe chest pain.  Jerky movements you cannot control (seizures). How is this diagnosed? This condition is diagnosed by measuring your blood pressure while you are seated, with your arm resting on a surface. The cuff of the blood pressure monitor will be placed directly against the skin of your upper arm at the level of your heart. It should be measured at least twice using the same arm. Certain conditions can cause a difference in blood pressure between your right and left arms. Certain factors can cause blood pressure readings to be lower or higher than normal (elevated) for a short period of time:  When your blood pressure is higher when you are in a health care provider's office than when you are at home, this is called white coat hypertension. Most people with this condition do not need medicines.  When your blood pressure is higher at home than when you are in a health care provider's office, this is called masked hypertension. Most people with this condition may need medicines to control blood pressure. If you have a high blood pressure reading during one visit or you have normal blood pressure with other risk factors:  You may be asked to return on a different day to have your blood pressure checked again.  You may be asked to monitor your blood pressure at home for 1 week or  longer. If you are diagnosed with hypertension, you may have other blood or imaging tests to help your health care provider understand your overall risk for other conditions. How is this treated? This condition is treated by making healthy lifestyle changes, such as eating healthy foods, exercising more, and reducing your alcohol intake. Your health care provider may prescribe medicine if  lifestyle changes are not enough to get your blood pressure under control, and if:  Your systolic blood pressure is above 130.  Your diastolic blood pressure is above 80. Your personal target blood pressure may vary depending on your medical conditions, your age, and other factors. Follow these instructions at home: Eating and drinking   Eat a diet that is high in fiber and potassium, and low in sodium, added sugar, and fat. An example eating plan is called the DASH (Dietary Approaches to Stop Hypertension) diet. To eat this way: ? Eat plenty of fresh fruits and vegetables. Try to fill half of your plate at each meal with fruits and vegetables. ? Eat whole grains, such as whole wheat pasta, brown rice, or whole grain bread. Fill about one quarter of your plate with whole grains. ? Eat or drink low-fat dairy products, such as skim milk or low-fat yogurt. ? Avoid fatty cuts of meat, processed or cured meats, and poultry with skin. Fill about one quarter of your plate with lean proteins, such as fish, chicken without skin, beans, eggs, and tofu. ? Avoid premade and processed foods. These tend to be higher in sodium, added sugar, and fat.  Reduce your daily sodium intake. Most people with hypertension should eat less than 1,500 mg of sodium a day.  Limit alcohol intake to no more than 1 drink a day for nonpregnant women and 2 drinks a day for men. One drink equals 12 oz of beer, 5 oz of wine, or 1 oz of hard liquor. Lifestyle   Work with your health care provider to maintain a healthy body weight or to lose weight. Ask what an ideal weight is for you.  Get at least 30 minutes of exercise that causes your heart to beat faster (aerobic exercise) most days of the week. Activities may include walking, swimming, or biking.  Include exercise to strengthen your muscles (resistance exercise), such as pilates or lifting weights, as part of your weekly exercise routine. Try to do these types of  exercises for 30 minutes at least 3 days a week.  Do not use any products that contain nicotine or tobacco, such as cigarettes and e-cigarettes. If you need help quitting, ask your health care provider.  Monitor your blood pressure at home as told by your health care provider.  Keep all follow-up visits as told by your health care provider. This is important. Medicines  Take over-the-counter and prescription medicines only as told by your health care provider. Follow directions carefully. Blood pressure medicines must be taken as prescribed.  Do not skip doses of blood pressure medicine. Doing this puts you at risk for problems and can make the medicine less effective.  Ask your health care provider about side effects or reactions to medicines that you should watch for. Contact a health care provider if:  You think you are having a reaction to a medicine you are taking.  You have headaches that keep coming back (recurring).  You feel dizzy.  You have swelling in your ankles.  You have trouble with your vision. Get help right away if:  You develop a  severe headache or confusion.  You have unusual weakness or numbness.  You feel faint.  You have severe pain in your chest or abdomen.  You vomit repeatedly.  You have trouble breathing. Summary  Hypertension is when the force of blood pumping through your arteries is too strong. If this condition is not controlled, it may put you at risk for serious complications.  Your personal target blood pressure may vary depending on your medical conditions, your age, and other factors. For most people, a normal blood pressure is less than 120/80.  Hypertension is treated with lifestyle changes, medicines, or a combination of both. Lifestyle changes include weight loss, eating a healthy, low-sodium diet, exercising more, and limiting alcohol. This information is not intended to replace advice given to you by your health care provider.  Make sure you discuss any questions you have with your health care provider. Document Released: 10/22/2005 Document Revised: 09/19/2016 Document Reviewed: 09/19/2016 Elsevier Interactive Patient Education  2019 [employer].

## 2019-01-18 NOTE — Assessment & Plan Note (Signed)
Tolerating statin, encouraged heart healthy diet, avoid trans fats, minimize simple carbs and saturated fats. Increase exercise as tolerated 

## 2019-01-18 NOTE — Assessment & Plan Note (Signed)
Encouraged DASH diet, decrease po intake and increase exercise as tolerated. Needs 7-8 hours of sleep nightly. Avoid trans fats, eat small, frequent meals every 4-5 hours with lean proteins, complex carbs and healthy fats. Minimize simple carbs. Encouraged to consider healthy weight and wellness.

## 2019-01-18 NOTE — Assessment & Plan Note (Signed)
hgba1c acceptable, minimize simple carbs. Increase exercise as tolerated. Continue current meds 
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
## 2019-01-18 NOTE — Assessment & Plan Note (Signed)
Well controlled, no changes to meds. Encouraged heart healthy diet such as the DASH diet and exercise as tolerated.  °

## 2019-01-19 ENCOUNTER — Encounter (INDEPENDENT_AMBULATORY_CARE_PROVIDER_SITE_OTHER): Payer: Self-pay

## 2019-01-20 ENCOUNTER — Other Ambulatory Visit: Payer: Self-pay

## 2019-01-20 ENCOUNTER — Ambulatory Visit: Payer: Medicare Other | Admitting: Podiatry

## 2019-01-20 ENCOUNTER — Encounter: Payer: Self-pay | Admitting: Podiatry

## 2019-01-20 DIAGNOSIS — M79675 Pain in left toe(s): Secondary | ICD-10-CM | POA: Diagnosis not present

## 2019-01-20 DIAGNOSIS — E1142 Type 2 diabetes mellitus with diabetic polyneuropathy: Secondary | ICD-10-CM

## 2019-01-20 DIAGNOSIS — L84 Corns and callosities: Secondary | ICD-10-CM

## 2019-01-20 DIAGNOSIS — M79674 Pain in right toe(s): Secondary | ICD-10-CM

## 2019-01-20 DIAGNOSIS — B351 Tinea unguium: Secondary | ICD-10-CM

## 2019-01-20 NOTE — Progress Notes (Signed)
Subjective: [name] presents with diabetes, diabetic neuropathy and cc of painful, discolored, thick toenails and painful callus/corn which interfere with activities of daily living. Pain is aggravated when wearing enclosed shoe gear. Pain is relieved with periodic professional debridement.  [name] is requesting diabetic shoes on today's visit.  [name], MD is his PCP. Last visit was 01/15/2019.   Current Outpatient Medications:  .  amLODipine (NORVASC) 5 MG tablet, Take 1 tablet (5 mg total) by mouth at bedtime., Disp: 90 tablet, Rfl: 1 .  Ascorbic Acid (VITAMIN C) 1000 MG tablet, Take 1,000 mg by mouth daily., Disp: , Rfl:  .  aspirin EC 81 MG tablet, Take 81 mg by mouth at bedtime., Disp: , Rfl:  .  atorvastatin (LIPITOR) 80 MG tablet, TAKE 1 TABLET BY MOUTH  EVERY DAY AT 6PM, Disp: 90 tablet, Rfl: 1 .  buPROPion (WELLBUTRIN SR) 150 MG 12 hr tablet, Take 1 tablet (150 mg total) by mouth daily., Disp: 30 tablet, Rfl: 0 .  carvedilol (COREG) 25 MG tablet, TAKE 1/2 TABLET BY MOUTH  TWICE A DAY WITH MEALS, Disp: 90 tablet, Rfl: 1 .  cholecalciferol (VITAMIN D) 1000 UNITS tablet, Take 1,000 Units by mouth daily., Disp: , Rfl:  .  cloNIDine (CATAPRES) 0.2 MG tablet, TAKE ONE-HALF TABLET BY  MOUTH TWO TIMES DAILY, Disp: 90 tablet, Rfl: 1 .  glimepiride (AMARYL) 4 MG tablet, Take 1 tablet (4 mg total) by mouth 2 (two) times daily., Disp: 180 tablet, Rfl: 1 .  hydrochlorothiazide (MICROZIDE) 12.5 MG capsule, TAKE 1 CAPSULE BY MOUTH  DAILY, Disp: 90 capsule, Rfl: 1 .  HYDROcodone-acetaminophen (NORCO/VICODIN) 5-325 MG tablet, Take 1-2 tablets by mouth every 6 (six) hours as needed for moderate pain., Disp: 30 tablet, Rfl: 0 .  latanoprost (XALATAN) 0.005 % ophthalmic solution, Place 1 drop into both eyes at bedtime., Disp: , Rfl:  .  losartan (COZAAR) 50 MG tablet, TAKE 1 TABLET BY MOUTH  DAILY, Disp: 90 tablet, Rfl: 1 .  Magnesium 500 MG TABS, Take 500 mg by mouth daily., Disp: ,  Rfl:  .  metFORMIN (GLUCOPHAGE) 1000 MG tablet, TAKE 1 TABLET BY MOUTH  EVERY MORNING, 1/2 TABLET  AT LUNCH AND 1 TABLET EVERY EVENING., Disp: 225 tablet, Rfl: 0 .  Multiple Vitamin (MULTIVITAMIN) tablet, Take 1 tablet by mouth daily.  , Disp: , Rfl:  .  Omega-3 Fatty Acids (FISH OIL) 1000 MG CAPS, Take 1,000 mg by mouth 2 (two) times daily., Disp: , Rfl:  .  pregabalin (LYRICA) 150 MG capsule, Take 1 capsule (150 mg total) by mouth 3 (three) times daily., Disp: 270 capsule, Rfl: 1 .  PROAIR HFA 108 (90 Base) MCG/ACT inhaler, USE 2 PUFFS EVERY 6 HOURS  AS NEEDED FOR WHEEZING OR  SHORTNESS OF BREATH, Disp: 25.5 g, Rfl: 2 .  vitamin B-12 (CYANOCOBALAMIN) 1000 MCG tablet, Take 1,000 mcg by mouth 2 (two) times a week. Monday and Thursday, Disp: , Rfl:   Allergies  Allergen Reactions  . No Known Allergies     Vascular Examination: Capillary refill time immediate x 10 digits.  Dorsalis pedis 2/4 right; 0/4 left   Posterior tibial pulses 2/4 right and 1/4 left.  No digital hair x 10 digits.  Skin temperature WNL b/l.  Dermatological Examination: Skin with normal turgor, texture and tone b/l.  Toenails 1-5 b/l discolored, thick, dystrophic with subungual debris and pain with palpation to nailbeds due to thickness of nails.  Hyperkeratotic lesion plantarmedial hallux  IPJ left foot with subdermal hemorrhage and fissuring noted. No edema, no erythema, no flocculence.  Hyperkeratotic lesion plantarmedial hallux IPJ right foot. No erythema, no edema, no drainage, no flocculence.  Musculoskeletal: Muscle strength 5/5 to all LE muscle groups  Neurological: Sensation diminished with 10 gram monofilament.  Vibratory sensation diminished.  Assessment: 1. Painful onychomycosis toenails 1-5 b/l 2. Preulcerative callus left hallux 3. Callus right hallux 4. NIDDM with Diabetic neuropathy  Plan: 1. Continue diabetic foot care principles. Literature dispensed. 2. Toenails 1-5 b/l were  debrided in length and girth without iatrogenic bleeding. Preulcerative callus pared left hallux utilizing sterile scalpel blade without incident. 3. Hyperkeratotic lesion pared right hallux with sterile scalpel blade without incident. 4. Patient to continue soft, supportive shoe gear daily. Will start process for diabetic shoes. He qualifies based on diagnoses: NIDDM with neuropathy, preulcerative callus left hallux, callus right hallux, HAV with bunion b/l. 5. Patient to report any pedal injuries to medical professional  6. Follow up 9 weeks.  7. Patient/POA to call should there be a concern in the interim.

## 2019-01-20 NOTE — Patient Instructions (Addendum)
Corns and Calluses Corns are small areas of thickened skin that occur on the top, sides, or tip of a toe. They contain a cone-shaped core with a point that can press on a nerve below. This causes pain.  Calluses are areas of thickened skin that can occur anywhere on the body, including the hands, fingers, palms, soles of the feet, and heels. Calluses are usually larger than corns. What are the causes? Corns and calluses are caused by rubbing (friction) or pressure, such as from shoes that are too tight or do not fit properly. What increases the risk? Corns are more likely to develop in people who have misshapen toes (toe deformities), such as hammer toes. Calluses can occur with friction to any area of the skin. They are more likely to develop in people who:  Work with their hands.  Wear shoes that fit poorly, are too tight, or are high-heeled.  Have toe deformities. What are the signs or symptoms? Symptoms of a corn or callus include:  A hard growth on the skin.  Pain or tenderness under the skin.  Redness and swelling.  Increased discomfort while wearing tight-fitting shoes, if your feet are affected. If a corn or callus becomes infected, symptoms may include:  Redness and swelling that gets worse.  Pain.  Fluid, blood, or pus draining from the corn or callus. How is this diagnosed? Corns and calluses may be diagnosed based on your symptoms, your medical history, and a physical exam. How is this treated? Treatment for corns and calluses may include:  Removing the cause of the friction or pressure. This may involve: ? Changing your shoes. ? Wearing shoe inserts (orthotics) or other protective layers in your shoes, such as a corn pad. ? Wearing gloves.  Applying medicine to the skin (topical medicine) to help soften skin in the hardened, thickened areas.  Removing layers of dead skin with a file to reduce the size of the corn or callus.  Removing the corn or callus with a  scalpel or laser.  Taking antibiotic medicines, if your corn or callus is infected.  Having surgery, if a toe deformity is the cause. Follow these instructions at home:   Take over-the-counter and prescription medicines only as told by your health care provider.  If you were prescribed an antibiotic, take it as told by your health care provider. Do not stop taking it even if your condition starts to improve.  Wear shoes that fit well. Avoid wearing high-heeled shoes and shoes that are too tight or too loose.  Wear any padding, protective layers, gloves, or orthotics as told by your health care provider.  Soak your hands or feet and then use a file or pumice stone to soften your corn or callus. Do this as told by your health care provider.  Check your corn or callus every day for symptoms of infection. Contact a health care provider if you:  Notice that your symptoms do not improve with treatment.  Have redness or swelling that gets worse.  Notice that your corn or callus becomes painful.  Have fluid, blood, or pus coming from your corn or callus.  Have new symptoms. Summary  Corns are small areas of thickened skin that occur on the top, sides, or tip of a toe.  Calluses are areas of thickened skin that can occur anywhere on the body, including the hands, fingers, palms, and soles of the feet. Calluses are usually larger than corns.  Corns and calluses are caused by   rubbing (friction) or pressure, such as from shoes that are too tight or do not fit properly.  Treatment may include wearing any padding, protective layers, gloves, or orthotics as told by your health care provider. This information is not intended to replace advice given to you by your health care provider. Make sure you discuss any questions you have with your health care provider. Document Released: 07/28/2004 Document Revised: 09/04/2017 Document Reviewed: 09/04/2017 [employer] Interactive Patient Education   2019 [employer]  Diabetic Neuropathy Diabetic neuropathy refers to nerve damage that is caused by diabetes (diabetes mellitus). Over time, people with diabetes can develop nerve damage throughout the body. There are several types of diabetic neuropathy:  Peripheral neuropathy. This is the most common type of diabetic neuropathy. It causes damage to nerves that carry signals between the spinal cord and other parts of the body (peripheral nerves). This usually affects nerves in the feet and legs first, and may eventually affect the hands and arms. The damage affects the ability to sense touch or temperature.  Autonomic neuropathy. This type causes damage to nerves that control involuntary functions (autonomic nerves). These nerves carry signals that control: ? Heartbeat. ? Body temperature. ? Blood pressure. ? Urination. ? Digestion. ? Sweating. ? Sexual function. ? Response to changing blood sugar (glucose) levels.  Focal neuropathy. This type of nerve damage affects one area of the body, such as an arm, a leg, or the face. The injury may involve one nerve or a small group of nerves. Focal neuropathy can be painful and unpredictable, and occurs most often in older adults with diabetes. This often develops suddenly, but usually improves over time and does not cause long-term problems.  Proximal neuropathy. This type of nerve damage affects the nerves of the thighs, hips, buttocks, or legs. It causes severe pain, weakness, and muscle death (atrophy), usually in the thigh muscles. It is more common among older men and people who have type 2 diabetes. The length of recovery time may vary. What are the causes? Peripheral, autonomic, and focal neuropathies are caused by diabetes that is not well controlled with treatment. The cause of proximal neuropathy is not known, but it may be caused by inflammation related to uncontrolled blood glucose levels. What are the signs or symptoms? Peripheral  neuropathy Peripheral neuropathy develops slowly over time. When the nerves of the feet and legs no longer work, you may experience:  Burning, stabbing, or aching pain in the legs or feet.  Pain or cramping in the legs or feet.  Loss of feeling (numbness) and inability to feel pressure or pain in the feet. This can lead to: ? Thick calluses or sores on areas of constant pressure. ? Ulcers. ? Reduced ability to feel temperature changes.  Foot deformities.  Muscle weakness.  Loss of balance or coordination. Autonomic neuropathy The symptoms of autonomic neuropathy vary depending on which nerves are affected. Symptoms may include:  Problems with digestion, such as: ? Nausea or vomiting. ? Poor appetite. ? Bloating. ? Diarrhea or constipation. ? Trouble swallowing. ? Losing weight without trying to.  Problems with the heart, blood and lungs, such as: ? Dizziness, especially when standing up. ? Fainting. ? Shortness of breath. ? Irregular heartbeat.  Bladder problems, such as: ? Trouble starting or stopping urination. ? Leaking urine. ? Trouble emptying the bladder. ? Urinary tract infections (UTIs).  Problems with other body functions, such as: ? Sweat. You may sweat too much or too little. ? Temperature. You might get  hot easily. Or, you might feel cold more than usual. ? Sexual function. Men may not be able to get or maintain an erection. Women may have vaginal dryness and difficulty with arousal. Focal neuropathy Symptoms affect only one area of the body. Common symptoms include:  Numbness.  Tingling.  Burning pain.  Prickling feeling.  Very sensitive skin.  Weakness.  Inability to move (paralysis).  Muscle twitching.  Muscles getting smaller (wasting).  Poor coordination.  Double or blurred vision. Proximal neuropathy  Sudden, severe pain in the hip, thigh, or buttocks. Pain may spread from the back into the legs (sciatica).  Pain and numbness  in the arms and legs.  Tingling.  Loss of bladder control or bowel control.  Weakness and wasting of thigh muscles.  Difficulty getting up from a seated position.  Abdominal swelling.  Unexplained weight loss. How is this diagnosed? Diagnosis usually involves reviewing your medical history and any symptoms you have. Diagnosis varies depending on the type of neuropathy your health care provider suspects. Peripheral neuropathy Your health care provider will check areas that are affected by your nervous system (neurologic exam), such as your reflexes, how you move, and what you can feel. You may have other tests, such as:  Blood tests.  Removal and examination of fluid that surrounds the spinal cord (lumbar puncture).  CT scan.  MRI.  A test to check the nerves that control muscles (electromyogram, EMG).  Tests of how quickly messages pass through your nerves (nerve conduction velocity tests).  Removal of a small piece of nerve to be examined under a microscope (biopsy). Autonomic neuropathy You may have tests, such as:  Tests to measure your blood pressure and heart rate. This may include monitoring you while you are safely secured to an exam table that moves you from a lying position to an upright position (table tilt test).  Breathing tests to check your lungs.  Tests to check how food moves through the digestive system (gastric emptying tests).  Blood, sweat, or urine tests.  Ultrasound of your bladder.  Spinal fluid tests. Focal neuropathy This condition may be diagnosed with:  A neurologic exam.  CT scan.  MRI.  EMG.  Nerve conduction velocity tests. Proximal neuropathy There is no test to diagnose this type of neuropathy. You may have tests to rule out other possible causes of this type of neuropathy. Tests may include:  X-rays of your spine and lumbar region.  Lumbar puncture.  MRI. How is this treated? The goal of treatment is to keep nerve  damage from getting worse. The most important part of treatment is keeping your blood glucose level and your A1C level within your target range by following your diabetes management plan. Over time, maintaining lower blood glucose levels helps lessen symptoms. In some cases, you may need prescription pain medicine. Follow these instructions at home:  Lifestyle   Do not use any products that contain nicotine or tobacco, such as cigarettes and e-cigarettes. If you need help quitting, ask your health care provider.  Be physically active every day. Include strength training and balance exercises.  Follow a healthy meal plan.  Work with your health care provider to manage your blood pressure. General instructions  Follow your diabetes management plan as directed. ? Check your blood glucose levels as directed by your health care provider. ? Keep your blood glucose in your target range as directed by your health care provider. ? Have your A1C level checked at least two times a  year, or as often as told by your health care provider.  Take over the counter and prescription medicines only as told by your health care provider. This includes insulin and diabetes medicine.  Do not drive or use heavy machinery while taking prescription pain medicines.  Check your skin and feet every day for cuts, bruises, redness, blisters, or sores.  Keep all follow up visits as told by your health care provider. This is important. Contact a health care provider if:  You have burning, stabbing, or aching pain in your legs or feet.  You are unable to feel pressure or pain in your feet.  You develop problems with digestion, such as: ? Nausea. ? Vomiting. ? Bloating. ? Constipation. ? Diarrhea. ? Abdominal pain.  You have difficulty with urination, such as inability: ? To control when you urinate (incontinence). ? To completely empty the bladder (retention).  You have palpitations.  You feel dizzy,  weak, or faint when you stand up. Get help right away if:  You cannot urinate.  You have sudden weakness or loss of coordination.  You have trouble speaking.  You have pain or pressure in your chest.  You have an irregular heart beat.  You have sudden inability to move a part of your body. Summary  Diabetic neuropathy refers to nerve damage that is caused by diabetes. It can affect nerves throughout the entire body, causing numbness and pain in the arms, legs, digestive tract, heart, and other body systems.  Keep your blood glucose level and your blood pressure in your target range, as directed by your health care provider. This can help prevent neuropathy from getting worse.  Check your skin and feet every day for cuts, bruises, redness, blisters, or sores.  Do not use any products that contain nicotine or tobacco, such as cigarettes and e-cigarettes. If you need help quitting, ask your health care provider. This information is not intended to replace advice given to you by your health care provider. Make sure you discuss any questions you have with your health care provider. Document Released: 12/31/2001 Document Revised: 12/04/2017 Document Reviewed: 11/26/2016 [employer] Interactive Patient Education  2019 [employer]  Onychomycosis/Fungal Toenails  WHAT IS IT? An infection that lies within the keratin of your nail plate that is caused by a fungus.  WHY ME? Fungal infections affect all ages, sexes, races, and creeds.  There may be many factors that predispose you to a fungal infection such as age, coexisting medical conditions such as diabetes, or an autoimmune disease; stress, medications, fatigue, genetics, etc.  Bottom line: fungus thrives in a warm, moist environment and your shoes offer such a location.  IS IT CONTAGIOUS? Theoretically, yes.  You do not want to share shoes, nail clippers or files with someone who has fungal toenails.  Walking around barefoot in the same  room or sleeping in the same bed is unlikely to transfer the organism.  It is important to realize, however, that fungus can spread easily from one nail to the next on the same foot.  HOW DO WE TREAT THIS?  There are several ways to treat this condition.  Treatment may depend on many factors such as age, medications, pregnancy, liver and kidney conditions, etc.  It is best to ask your doctor which options are available to you.  1. No treatment.   Unlike many other medical concerns, you can live with this condition.  However for many people this can be a painful condition and may lead to  ingrown toenails or a bacterial infection.  It is recommended that you keep the nails cut short to help reduce the amount of fungal nail. 2. Topical treatment.  These range from herbal remedies to prescription strength nail lacquers.  About 40-50% effective, topicals require twice daily application for approximately 9 to 12 months or until an entirely new nail has grown out.  The most effective topicals are medical grade medications available through physicians offices. 3. Oral antifungal medications.  With an 80-90% cure rate, the most common oral medication requires 3 to 4 months of therapy and stays in your system for a year as the new nail grows out.  Oral antifungal medications do require blood work to make sure it is a safe drug for you.  A liver function panel will be performed prior to starting the medication and after the first month of treatment.  It is important to have the blood work performed to avoid any harmful side effects.  In general, this medication safe but blood work is required. 4. Laser Therapy.  This treatment is performed by applying a specialized laser to the affected nail plate.  This therapy is noninvasive, fast, and non-painful.  It is not covered by insurance and is therefore, out of pocket.  The results have been very good with a 80-95% cure rate.  [facility] is the only practice in  the area to offer this therapy. 5. Permanent Nail Avulsion.  Removing the entire nail so that a new nail will not grow back.

## 2019-01-22 ENCOUNTER — Encounter (INDEPENDENT_AMBULATORY_CARE_PROVIDER_SITE_OTHER): Payer: Self-pay | Admitting: Physician Assistant

## 2019-01-22 ENCOUNTER — Other Ambulatory Visit: Payer: Self-pay

## 2019-01-22 ENCOUNTER — Ambulatory Visit (INDEPENDENT_AMBULATORY_CARE_PROVIDER_SITE_OTHER): Payer: Medicare Other | Admitting: Physician Assistant

## 2019-01-22 VITALS — BP 152/69 | HR 85 | Temp 97.4°F | Ht 69.0 in | Wt 214.0 lb

## 2019-01-22 DIAGNOSIS — Z6831 Body mass index (BMI) 31.0-31.9, adult: Secondary | ICD-10-CM | POA: Diagnosis not present

## 2019-01-22 DIAGNOSIS — F3289 Other specified depressive episodes: Secondary | ICD-10-CM

## 2019-01-22 DIAGNOSIS — E669 Obesity, unspecified: Secondary | ICD-10-CM

## 2019-01-22 DIAGNOSIS — E119 Type 2 diabetes mellitus without complications: Secondary | ICD-10-CM

## 2019-01-22 MED ORDER — BUPROPION HCL ER (SR) 150 MG PO TB12: 150.0000 mg | ORAL_TABLET | Freq: Every day | ORAL | 0 refills | Status: DC
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
## 2019-01-27 ENCOUNTER — Encounter (INDEPENDENT_AMBULATORY_CARE_PROVIDER_SITE_OTHER): Payer: Self-pay

## 2019-01-28 ENCOUNTER — Telehealth: Payer: Self-pay | Admitting: Podiatry

## 2019-01-28 NOTE — Telephone Encounter (Signed)
Pt left message wanting to get schedule for diabetic shoes.  I returned call and it goes directly to voicemail again for a second day. I did leave a message to call back and get schedule for an appt in may to see Liliane Channel for diabetic shoes

## 2019-02-09 ENCOUNTER — Ambulatory Visit (INDEPENDENT_AMBULATORY_CARE_PROVIDER_SITE_OTHER): Payer: Medicare Other | Admitting: Physician Assistant

## 2019-02-11 ENCOUNTER — Ambulatory Visit (INDEPENDENT_AMBULATORY_CARE_PROVIDER_SITE_OTHER): Payer: Medicare Other | Admitting: Physician Assistant

## 2019-02-11 ENCOUNTER — Encounter (INDEPENDENT_AMBULATORY_CARE_PROVIDER_SITE_OTHER): Payer: Self-pay | Admitting: Physician Assistant

## 2019-02-11 ENCOUNTER — Other Ambulatory Visit: Payer: Self-pay

## 2019-02-11 DIAGNOSIS — Z6831 Body mass index (BMI) 31.0-31.9, adult: Secondary | ICD-10-CM | POA: Diagnosis not present

## 2019-02-11 DIAGNOSIS — F3289 Other specified depressive episodes: Secondary | ICD-10-CM | POA: Diagnosis not present

## 2019-02-11 DIAGNOSIS — E669 Obesity, unspecified: Secondary | ICD-10-CM | POA: Diagnosis not present

## 2019-02-11 DIAGNOSIS — E119 Type 2 diabetes mellitus without complications: Secondary | ICD-10-CM | POA: Diagnosis not present

## 2019-02-11 MED ORDER — BUPROPION HCL ER (SR) 150 MG PO TB12: 150.0000 mg | ORAL_TABLET | Freq: Every day | ORAL | 0 refills | Status: DC
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
## 2019-02-25 ENCOUNTER — Ambulatory Visit (INDEPENDENT_AMBULATORY_CARE_PROVIDER_SITE_OTHER): Payer: Medicare Other | Admitting: Physician Assistant

## 2019-02-26 ENCOUNTER — Encounter (INDEPENDENT_AMBULATORY_CARE_PROVIDER_SITE_OTHER): Payer: Self-pay | Admitting: Bariatrics

## 2019-02-26 ENCOUNTER — Other Ambulatory Visit: Payer: Self-pay

## 2019-02-26 ENCOUNTER — Ambulatory Visit (INDEPENDENT_AMBULATORY_CARE_PROVIDER_SITE_OTHER): Payer: Medicare Other | Admitting: Bariatrics

## 2019-02-26 DIAGNOSIS — Z6831 Body mass index (BMI) 31.0-31.9, adult: Secondary | ICD-10-CM | POA: Diagnosis not present

## 2019-02-26 DIAGNOSIS — E669 Obesity, unspecified: Secondary | ICD-10-CM | POA: Diagnosis not present

## 2019-02-26 DIAGNOSIS — F3289 Other specified depressive episodes: Secondary | ICD-10-CM

## 2019-02-26 DIAGNOSIS — E119 Type 2 diabetes mellitus without complications: Secondary | ICD-10-CM

## 2019-02-26 MED ORDER — BUPROPION HCL ER (SR) 150 MG PO TB12: 150.0000 mg | ORAL_TABLET | Freq: Every day | ORAL | 0 refills | Status: DC

## 2019-03-02 DIAGNOSIS — E119 Type 2 diabetes mellitus without complications: Secondary | ICD-10-CM | POA: Insufficient documentation
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
## 2019-03-05 ENCOUNTER — Other Ambulatory Visit: Payer: Self-pay | Admitting: Family Medicine

## 2019-03-11 ENCOUNTER — Ambulatory Visit (INDEPENDENT_AMBULATORY_CARE_PROVIDER_SITE_OTHER): Payer: Medicare Other | Admitting: Bariatrics

## 2019-03-11 ENCOUNTER — Encounter (INDEPENDENT_AMBULATORY_CARE_PROVIDER_SITE_OTHER): Payer: Self-pay | Admitting: Bariatrics

## 2019-03-11 ENCOUNTER — Other Ambulatory Visit: Payer: Self-pay

## 2019-03-11 DIAGNOSIS — Z6831 Body mass index (BMI) 31.0-31.9, adult: Secondary | ICD-10-CM

## 2019-03-11 DIAGNOSIS — E119 Type 2 diabetes mellitus without complications: Secondary | ICD-10-CM | POA: Diagnosis not present

## 2019-03-11 DIAGNOSIS — E669 Obesity, unspecified: Secondary | ICD-10-CM

## 2019-03-11 DIAGNOSIS — F3289 Other specified depressive episodes: Secondary | ICD-10-CM

## 2019-03-11 MED ORDER — BUPROPION HCL ER (SR) 150 MG PO TB12: 150.0000 mg | ORAL_TABLET | Freq: Every day | ORAL | 0 refills | Status: DC

## 2019-03-12 ENCOUNTER — Ambulatory Visit (INDEPENDENT_AMBULATORY_CARE_PROVIDER_SITE_OTHER): Payer: Medicare Other | Admitting: Family Medicine

## 2019-03-12 DIAGNOSIS — M1A9XX0 Chronic gout, unspecified, without tophus (tophi): Secondary | ICD-10-CM | POA: Diagnosis not present

## 2019-03-12 DIAGNOSIS — I10 Essential (primary) hypertension: Secondary | ICD-10-CM

## 2019-03-12 DIAGNOSIS — E1149 Type 2 diabetes mellitus with other diabetic neurological complication: Secondary | ICD-10-CM

## 2019-03-12 DIAGNOSIS — E785 Hyperlipidemia, unspecified: Secondary | ICD-10-CM | POA: Diagnosis not present

## 2019-03-12 DIAGNOSIS — Z6831 Body mass index (BMI) 31.0-31.9, adult: Secondary | ICD-10-CM

## 2019-03-12 DIAGNOSIS — Z7189 Other specified counseling: Secondary | ICD-10-CM | POA: Insufficient documentation

## 2019-03-12 DIAGNOSIS — E669 Obesity, unspecified: Secondary | ICD-10-CM | POA: Diagnosis not present

## 2019-03-12 NOTE — Assessment & Plan Note (Signed)
Encouraged continued quarantine and social distancing. Continue masking and frequent hand washing. Consider vitamin C 2-3 x daily, zinc 50 mg daily and sodium bicarb 1/4 tsp 3-6 x daily if symptoms develop

## 2019-03-12 NOTE — Assessment & Plan Note (Signed)
Following with healthy weight and wellness and has been losing weight for the most part. Did have a slight gain on last check. Encouraged continue same diet, decrease po intake and increase exercise as tolerated. Needs 7-8 hours of sleep nightly. Avoid trans fats, eat small, frequent meals every 4-5 hours with lean proteins, complex carbs and healthy fats.

## 2019-03-12 NOTE — Assessment & Plan Note (Signed)
No recent exacerbation 
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
## 2019-03-12 NOTE — Assessment & Plan Note (Signed)
hgba1c acceptable, minimize simple carbs. Increase exercise as tolerated. Continue current meds 

## 2019-03-12 NOTE — Assessment & Plan Note (Signed)
Well controlled for the most part but was mildly high at last visit, is encouraged to start checking and recording resting BP, pulse and pulse ox weekly and let us know any concerns, no changes to meds. Will review log at next visit. Encouraged heart healthy diet such as the DASH diet and exercise as tolerated.

## 2019-03-12 NOTE — Assessment & Plan Note (Signed)
Encouraged heart healthy diet, increase exercise, avoid trans fats, consider a krill oil cap daily, tolerates Atorvastatin
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
## 2019-03-15 ENCOUNTER — Encounter: Payer: Self-pay | Admitting: Family Medicine

## 2019-03-16 MED ORDER — PREGABALIN 150 MG PO CAPS: 150.0000 mg | ORAL_CAPSULE | Freq: Three times a day (TID) | ORAL | 1 refills | Status: DC

## 2019-03-17 ENCOUNTER — Other Ambulatory Visit (HOSPITAL_COMMUNITY): Payer: Self-pay | Admitting: Cardiovascular Disease

## 2019-03-17 DIAGNOSIS — I714 Abdominal aortic aneurysm, without rupture, unspecified: Secondary | ICD-10-CM

## 2019-03-25 ENCOUNTER — Encounter (INDEPENDENT_AMBULATORY_CARE_PROVIDER_SITE_OTHER): Payer: Self-pay | Admitting: Bariatrics

## 2019-03-25 ENCOUNTER — Ambulatory Visit (INDEPENDENT_AMBULATORY_CARE_PROVIDER_SITE_OTHER): Payer: Medicare Other | Admitting: Bariatrics

## 2019-03-25 ENCOUNTER — Ambulatory Visit: Payer: Medicare Other | Admitting: Podiatry

## 2019-03-25 ENCOUNTER — Other Ambulatory Visit: Payer: Self-pay

## 2019-03-25 DIAGNOSIS — E119 Type 2 diabetes mellitus without complications: Secondary | ICD-10-CM | POA: Diagnosis not present

## 2019-03-25 DIAGNOSIS — F3289 Other specified depressive episodes: Secondary | ICD-10-CM

## 2019-03-25 DIAGNOSIS — Z6831 Body mass index (BMI) 31.0-31.9, adult: Secondary | ICD-10-CM

## 2019-03-25 DIAGNOSIS — E669 Obesity, unspecified: Secondary | ICD-10-CM

## 2019-03-25 MED ORDER — BUPROPION HCL ER (SR) 150 MG PO TB12: 150.0000 mg | ORAL_TABLET | Freq: Every day | ORAL | 0 refills | Status: DC
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
## 2019-03-26 ENCOUNTER — Encounter: Payer: Self-pay | Admitting: Podiatry

## 2019-03-27 ENCOUNTER — Encounter: Payer: Self-pay | Admitting: Podiatry

## 2019-04-03 ENCOUNTER — Ambulatory Visit: Payer: Medicare Other | Admitting: Podiatry

## 2019-04-03 ENCOUNTER — Encounter: Payer: Self-pay | Admitting: Podiatry

## 2019-04-03 ENCOUNTER — Ambulatory Visit (INDEPENDENT_AMBULATORY_CARE_PROVIDER_SITE_OTHER): Payer: Medicare Other

## 2019-04-03 ENCOUNTER — Other Ambulatory Visit: Payer: Self-pay | Admitting: Podiatry

## 2019-04-03 ENCOUNTER — Other Ambulatory Visit: Payer: Self-pay

## 2019-04-03 DIAGNOSIS — L97529 Non-pressure chronic ulcer of other part of left foot with unspecified severity: Secondary | ICD-10-CM

## 2019-04-03 DIAGNOSIS — M79676 Pain in unspecified toe(s): Secondary | ICD-10-CM

## 2019-04-03 DIAGNOSIS — E11621 Type 2 diabetes mellitus with foot ulcer: Secondary | ICD-10-CM

## 2019-04-03 DIAGNOSIS — E1142 Type 2 diabetes mellitus with diabetic polyneuropathy: Secondary | ICD-10-CM

## 2019-04-03 DIAGNOSIS — B351 Tinea unguium: Secondary | ICD-10-CM | POA: Diagnosis not present

## 2019-04-03 MED ORDER — CEPHALEXIN 500 MG PO CAPS: 500.0000 mg | ORAL_CAPSULE | Freq: Three times a day (TID) | ORAL | 0 refills | Status: AC

## 2019-04-03 NOTE — Patient Instructions (Addendum)
DRESSING CHANGES LEFT GREAT TOE:  WEAR SURGICAL SHOE AT ALL TIMES    1. KEEP LEFT  FOOT/ GREAT TOE DRY AT ALL TIMES!!!!  2. CLEANSE ULCER WITH SALINE.  3. DAB DRY WITH GAUZE SPONGE.  4. APPLY A LIGHT AMOUNT OF IODOSORB TO BASE OF ULCER.  5. APPLY OUTER DRESSING/BAND-AID AS INSTRUCTED.  6. WEAR SURGICAL SHOE DAILY AT ALL TIMES.  7. DO NOT WALK BAREFOOT!!!  8.  IF YOU EXPERIENCE ANY FEVER, CHILLS, NIGHTSWEATS, NAUSEA OR VOMITING, ELEVATED OR LOW BLOOD SUGARS, REPORT TO EMERGENCY ROOM.  9. IF YOU EXPERIENCE INCREASED REDNESS, PAIN, SWELLING, DISCOLORATION, ODOR, PUS, DRAINAGE OR WARMTH OF YOUR FOOT, REPORT TO EMERGENCY ROOM.  Diabetes Mellitus and Foot Care Foot care is an important part of your health, especially when you have diabetes. Diabetes may cause you to have problems because of poor blood flow (circulation) to your feet and legs, which can cause your skin to:  Become thinner and drier.  Break more easily.  Heal more slowly.  Peel and crack. You may also have nerve damage (neuropathy) in your legs and feet, causing decreased feeling in them. This means that you may not notice minor injuries to your feet that could lead to more serious problems. Noticing and addressing any potential problems early is the best way to prevent future foot problems. How to care for your feet Foot hygiene  Wash your feet daily with warm water and mild soap. Do not use hot water. Then, pat your feet and the areas between your toes until they are completely dry. Do not soak your feet as this can dry your skin.  Trim your toenails straight across. Do not dig under them or around the cuticle. File the edges of your nails with an emery board or nail file.  Apply a moisturizing lotion or petroleum jelly to the skin on your feet and to dry, brittle toenails. Use lotion that does not contain alcohol and is unscented. Do not apply lotion between your toes. Shoes and socks  Wear clean socks or  stockings every day. Make sure they are not too tight. Do not wear knee-high stockings since they may decrease blood flow to your legs.  Wear shoes that fit properly and have enough cushioning. Always look in your shoes before you put them on to be sure there are no objects inside.  To break in new shoes, wear them for just a few hours a day. This prevents injuries on your feet. Wounds, scrapes, corns, and calluses  Check your feet daily for blisters, cuts, bruises, sores, and redness. If you cannot see the bottom of your feet, use a mirror or ask someone for help.  Do not cut corns or calluses or try to remove them with medicine.  If you find a minor scrape, cut, or break in the skin on your feet, keep it and the skin around it clean and dry. You may clean these areas with mild soap and water. Do not clean the area with peroxide, alcohol, or iodine.  If you have a wound, scrape, corn, or callus on your foot, look at it several times a day to make sure it is healing and not infected. Check for: ? Redness, swelling, or pain. ? Fluid or blood. ? Warmth. ? Pus or a bad smell. General instructions  Do not cross your legs. This may decrease blood flow to your feet.  Do not use heating pads or hot water bottles on your feet. They may burn  your skin. If you have lost feeling in your feet or legs, you may not know this is happening until it is too late.  Protect your feet from hot and cold by wearing shoes, such as at the beach or on hot pavement.  Schedule a complete foot exam at least once a year (annually) or more often if you have foot problems. If you have foot problems, report any cuts, sores, or bruises to your health care provider immediately. Contact a health care provider if:  You have a medical condition that increases your risk of infection and you have any cuts, sores, or bruises on your feet.  You have an injury that is not healing.  You have redness on your legs or feet.   You feel burning or tingling in your legs or feet.  You have pain or cramps in your legs and feet.  Your legs or feet are numb.  Your feet always feel cold.  You have pain around a toenail. Get help right away if:  You have a wound, scrape, corn, or callus on your foot and: ? You have pain, swelling, or redness that gets worse. ? You have fluid or blood coming from the wound, scrape, corn, or callus. ? Your wound, scrape, corn, or callus feels warm to the touch. ? You have pus or a bad smell coming from the wound, scrape, corn, or callus. ? You have a fever. ? You have a red line going up your leg. Summary  Check your feet every day for cuts, sores, red spots, swelling, and blisters.  Moisturize feet and legs daily.  Wear shoes that fit properly and have enough cushioning.  If you have foot problems, report any cuts, sores, or bruises to your health care provider immediately.  Schedule a complete foot exam at least once a year (annually) or more often if you have foot problems. This information is not intended to replace advice given to you by your health care provider. Make sure you discuss any questions you have with your health care provider. Document Released: 10/19/2000 Document Revised: 12/04/2017 Document Reviewed: 11/23/2016 Elsevier Interactive Patient Education  2019 [place] are small areas of thickened skin that occur on the top, sides, or tip of a toe. They contain a cone-shaped core with a point that can press on a nerve below. This causes pain.  Calluses are areas of thickened skin that can occur anywhere on the body, including the hands, fingers, palms, soles of the feet, and heels. Calluses are usually larger than corns. What are the causes? Corns and calluses are caused by rubbing (friction) or pressure, such as from shoes that are too tight or do not fit properly. What increases the risk? Corns are more likely to develop in  people who have misshapen toes (toe deformities), such as hammer toes. Calluses can occur with friction to any area of the skin. They are more likely to develop in people who:  Work with their hands.  Wear shoes that fit poorly, are too tight, or are high-heeled.  Have toe deformities. What are the signs or symptoms? Symptoms of a corn or callus include:  A hard growth on the skin.  Pain or tenderness under the skin.  Redness and swelling.  Increased discomfort while wearing tight-fitting shoes, if your feet are affected. If a corn or callus becomes infected, symptoms may include:  Redness and swelling that gets worse.  Pain.  Fluid, blood, or pus  draining from the corn or callus. How is this diagnosed? Corns and calluses may be diagnosed based on your symptoms, your medical history, and a physical exam. How is this treated? Treatment for corns and calluses may include:  Removing the cause of the friction or pressure. This may involve: ? Changing your shoes. ? Wearing shoe inserts (orthotics) or other protective layers in your shoes, such as a corn pad. ? Wearing gloves.  Applying medicine to the skin (topical medicine) to help soften skin in the hardened, thickened areas.  Removing layers of dead skin with a file to reduce the size of the corn or callus.  Removing the corn or callus with a scalpel or laser.  Taking antibiotic medicines, if your corn or callus is infected.  Having surgery, if a toe deformity is the cause. Follow these instructions at home:   Take over-the-counter and prescription medicines only as told by your health care provider.  If you were prescribed an antibiotic, take it as told by your health care provider. Do not stop taking it even if your condition starts to improve.  Wear shoes that fit well. Avoid wearing high-heeled shoes and shoes that are too tight or too loose.  Wear any padding, protective layers, gloves, or orthotics as told by  your health care provider.  Soak your hands or feet and then use a file or pumice stone to soften your corn or callus. Do this as told by your health care provider.  Check your corn or callus every day for symptoms of infection. Contact a health care provider if you:  Notice that your symptoms do not improve with treatment.  Have redness or swelling that gets worse.  Notice that your corn or callus becomes painful.  Have fluid, blood, or pus coming from your corn or callus.  Have new symptoms. Summary  Corns are small areas of thickened skin that occur on the top, sides, or tip of a toe.  Calluses are areas of thickened skin that can occur anywhere on the body, including the hands, fingers, palms, and soles of the feet. Calluses are usually larger than corns.  Corns and calluses are caused by rubbing (friction) or pressure, such as from shoes that are too tight or do not fit properly.  Treatment may include wearing any padding, protective layers, gloves, or orthotics as told by your health care provider. This information is not intended to replace advice given to you by your health care provider. Make sure you discuss any questions you have with your health care provider. Document Released: 07/28/2004 Document Revised: 09/04/2017 Document Reviewed: 09/04/2017 Elsevier Interactive Patient Education  2019 [employer]  Onychomycosis/Fungal Toenails  WHAT IS IT? An infection that lies within the keratin of your nail plate that is caused by a fungus.  WHY ME? Fungal infections affect all ages, sexes, races, and creeds.  There may be many factors that predispose you to a fungal infection such as age, coexisting medical conditions such as diabetes, or an autoimmune disease; stress, medications, fatigue, genetics, etc.  Bottom line: fungus thrives in a warm, moist environment and your shoes offer such a location.  IS IT CONTAGIOUS? Theoretically, yes.  You do not want to share shoes,  nail clippers or files with someone who has fungal toenails.  Walking around barefoot in the same room or sleeping in the same bed is unlikely to transfer the organism.  It is important to realize, however, that fungus can spread easily from one nail to  the next on the same foot.  HOW DO WE TREAT THIS?  There are several ways to treat this condition.  Treatment may depend on many factors such as age, medications, pregnancy, liver and kidney conditions, etc.  It is best to ask your doctor which options are available to you.  1. No treatment.   Unlike many other medical concerns, you can live with this condition.  However for many people this can be a painful condition and may lead to ingrown toenails or a bacterial infection.  It is recommended that you keep the nails cut short to help reduce the amount of fungal nail. 2. Topical treatment.  These range from herbal remedies to prescription strength nail lacquers.  About 40-50% effective, topicals require twice daily application for approximately 9 to 12 months or until an entirely new nail has grown out.  The most effective topicals are medical grade medications available through physicians offices. 3. Oral antifungal medications.  With an 80-90% cure rate, the most common oral medication requires 3 to 4 months of therapy and stays in your system for a year as the new nail grows out.  Oral antifungal medications do require blood work to make sure it is a safe drug for you.  A liver function panel will be performed prior to starting the medication and after the first month of treatment.  It is important to have the blood work performed to avoid any harmful side effects.  In general, this medication safe but blood work is required. 4. Laser Therapy.  This treatment is performed by applying a specialized laser to the affected nail plate.  This therapy is noninvasive, fast, and non-painful.  It is not covered by insurance and is therefore, out of pocket.  The  results have been very good with a 80-95% cure rate.  The [facility] is the only practice in the area to offer this therapy. 5. Permanent Nail Avulsion.  Removing the entire nail so that a new nail will not grow back.

## 2019-04-05 NOTE — Progress Notes (Signed)
Subjective: Patient presents today for preventative diabetic foot care. He has h/o preulcerative callus left hallux.  He states he has noticed discoloration of the lesion and drainage in his sock since last week. He denies any odor from digit. He denies any fever, chills, nightsweats, nausea or vomiting.          Donald Lukes, MD is his PCP and last visit was 03/12/2019.  Medications reviewed.  Allergies  Allergen Reactions  . No Known Allergies      Objective:  Vascular Examination: Capillary refill time immediate x 10 digits.  Dorsalis pedis pulses 2/4 right, 0/4 left.  Posterior tibial pulses 2/4 right, 1/4 left.  Digital hair absent x 10 digits.  Skin temperature gradient WNL  B/l.  Dermatological Examination: Skin with normal turgor, texture and tone b/l.  Toenails 1-5 b/l discolored, thick, dystrophic with subungual debris and pain with palpation to nailbeds due to thickness of nails.      Ulceration located left hallux.  Predebridement measurements carried out today of 4.0 x 4.0 cm.  There is an empty blister roof and denuded skin noted plantarly extending to medial aspect of digit. No erythema, no edema,  no odor was encountered.         Postdebridement measurements today are 4.0 x 4.0 x 0.2 cm partial thickness ulceration.  No probing to bone, no undermining, no active pus or purulence noted. No deep abscess, no erythema, no edema, no odor.    Musculoskeletal: Muscle strength 5/5 to all LE muscle groups.  Neurological: Sensation diminished with 10 gram monofilament.  Vibratory sensation diminished b/l.  Xray left foot with no evidence of bone erosion left hallux. No gas in tissues. Spurring noted hallux IPJ.  Assessment: 1. Diabetic ulcer left hallux, noninfected 2. Painful onychomycosis toenails 1-5 b/l 3. NIDDM with peripheral neuropathy  Plan: 1. Toenails 1-5 b/l were debrided in length and girth without iatrogenic bleeding. 2. Ulcer  was debrided and reactive hyperkeratoses and necrotic tissue was resected to the level of bleeding or viable tissue. Ulcer was cleansed with wound cleanser. Iodosorb Gel was applied to base of wound with light dressing. 3. Xray of left foot was taken and reviewed with [name]. 4. Surgical shoe was dispensed for left foot. 5. Patient was given instructions on offloading and dressing change/aftercare and was instructed to call immediately if any signs or symptoms of infection arise.  6. Prescription for Keflex 500 mg po tid x 10 days. 7. Patient instructed to report to emergency department with worsening appearance of ulcer/toe/foot, increased pain, foul odor, increased redness, swelling, drainage, fever, chills, nightsweats, nausea, vomiting, increased blood sugar.  8. Patient/POA related understanding. 9. Follow up one week with [name] for wound check left hallux. 10. Patient/POA to call should there be a concern in the interim.

## 2019-04-06 ENCOUNTER — Ambulatory Visit: Payer: Medicare Other | Admitting: Orthotics

## 2019-04-06 ENCOUNTER — Other Ambulatory Visit: Payer: Self-pay

## 2019-04-06 DIAGNOSIS — E1140 Type 2 diabetes mellitus with diabetic neuropathy, unspecified: Secondary | ICD-10-CM

## 2019-04-06 DIAGNOSIS — E1142 Type 2 diabetes mellitus with diabetic polyneuropathy: Secondary | ICD-10-CM

## 2019-04-06 DIAGNOSIS — E11621 Type 2 diabetes mellitus with foot ulcer: Secondary | ICD-10-CM

## 2019-04-06 NOTE — Progress Notes (Signed)

## 2019-04-08 ENCOUNTER — Encounter (INDEPENDENT_AMBULATORY_CARE_PROVIDER_SITE_OTHER): Payer: Self-pay | Admitting: Bariatrics

## 2019-04-08 ENCOUNTER — Other Ambulatory Visit: Payer: Self-pay

## 2019-04-08 ENCOUNTER — Ambulatory Visit (INDEPENDENT_AMBULATORY_CARE_PROVIDER_SITE_OTHER): Payer: Medicare Other | Admitting: Bariatrics

## 2019-04-08 DIAGNOSIS — E7849 Other hyperlipidemia: Secondary | ICD-10-CM | POA: Diagnosis not present

## 2019-04-08 DIAGNOSIS — E669 Obesity, unspecified: Secondary | ICD-10-CM | POA: Diagnosis not present

## 2019-04-08 DIAGNOSIS — F3289 Other specified depressive episodes: Secondary | ICD-10-CM | POA: Diagnosis not present

## 2019-04-08 DIAGNOSIS — Z6831 Body mass index (BMI) 31.0-31.9, adult: Secondary | ICD-10-CM | POA: Diagnosis not present

## 2019-04-08 MED ORDER — BUPROPION HCL ER (SR) 150 MG PO TB12: 150.0000 mg | ORAL_TABLET | Freq: Every day | ORAL | 0 refills | Status: DC
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
## 2019-04-10 ENCOUNTER — Ambulatory Visit: Payer: Medicare Other | Admitting: Podiatry

## 2019-04-10 ENCOUNTER — Encounter: Payer: Self-pay | Admitting: Podiatry

## 2019-04-10 ENCOUNTER — Other Ambulatory Visit: Payer: Self-pay

## 2019-04-10 VITALS — Temp 97.9°F

## 2019-04-10 DIAGNOSIS — L97529 Non-pressure chronic ulcer of other part of left foot with unspecified severity: Secondary | ICD-10-CM | POA: Diagnosis not present

## 2019-04-10 DIAGNOSIS — E1142 Type 2 diabetes mellitus with diabetic polyneuropathy: Secondary | ICD-10-CM

## 2019-04-10 DIAGNOSIS — E11621 Type 2 diabetes mellitus with foot ulcer: Secondary | ICD-10-CM | POA: Diagnosis not present

## 2019-04-10 NOTE — Progress Notes (Signed)
Subjective: 73 year old male presents the office today for evaluation of a wound on his left big toe.  He has been seen by Dr. Elisha Ponder for this.  He states the wound is doing well he said no drainage or pus coming from the area no swelling or redness.  He has been keeping Iodosorb on the wound daily.  When the surgical shoe.  He has no new concerns. Denies any systemic complaints such as fevers, chills, nausea, vomiting. No acute changes since last appointment, and no other complaints at this time.   His last A1c was 6.3 and he states that his blood sugar at home as been "good".   Objective: AAO x3, NAD DP/PT pulses palpable bilaterally, On the plantar aspect of the left hallux is a hyperkeratotic lesion there is a central wound.  The wound today measures 0.8 x 0.7 cm and superficial debris.  Hyperkeratotic periwound.  There is no probing, undermining or tunneling.  There is no surrounding erythema, ascending cellulitis.  No fluctuation crepitation any malodor. No open lesions or pre-ulcerative lesions.  No pain with calf compression, swelling, warmth, erythema  Assessment: Left hallux ulceration with improved  Plan: -All treatment options discussed with the patient including all alternatives, risks, complications.  -Debrided the wound utilizing a 312 with scalpel down to healthy, granular tissue.  We then switched to using a Medihoney to the wound daily.  This was also dispensed today.  Continue with surgical shoe and offloading. -We discussed to monitor for any signs or symptoms of infection to call the office immediately should any occur. -Patient encouraged to call the office with any questions, concerns, change in symptoms.   [name]

## 2019-04-10 NOTE — Patient Instructions (Signed)
Apply medihoney to the wound daily Continue with surgical offloading shoe. If it seems to rub other areas of your foot, please let me know.  Monitor for any signs/symptoms of infection. Call the office immediately if any occur or go directly to the emergency room. Call with any questions/concerns.   Diabetes Mellitus and Foot Care Foot care is an important part of your health, especially when you have diabetes. Diabetes may cause you to have problems because of poor blood flow (circulation) to your feet and legs, which can cause your skin to:  Become thinner and drier.  Break more easily.  Heal more slowly.  Peel and crack. You may also have nerve damage (neuropathy) in your legs and feet, causing decreased feeling in them. This means that you may not notice minor injuries to your feet that could lead to more serious problems. Noticing and addressing any potential problems early is the best way to prevent future foot problems. How to care for your feet Foot hygiene  Wash your feet daily with warm water and mild soap. Do not use hot water. Then, pat your feet and the areas between your toes until they are completely dry. Do not soak your feet as this can dry your skin.  Trim your toenails straight across. Do not dig under them or around the cuticle. File the edges of your nails with an emery board or nail file.  Apply a moisturizing lotion or petroleum jelly to the skin on your feet and to dry, brittle toenails. Use lotion that does not contain alcohol and is unscented. Do not apply lotion between your toes. Shoes and socks  Wear clean socks or stockings every day. Make sure they are not too tight. Do not wear knee-high stockings since they may decrease blood flow to your legs.  Wear shoes that fit properly and have enough cushioning. Always look in your shoes before you put them on to be sure there are no objects inside.  To break in new shoes, wear them for just a few hours a day. This  prevents injuries on your feet. Wounds, scrapes, corns, and calluses  Check your feet daily for blisters, cuts, bruises, sores, and redness. If you cannot see the bottom of your feet, use a mirror or ask someone for help.  Do not cut corns or calluses or try to remove them with medicine.  If you find a minor scrape, cut, or break in the skin on your feet, keep it and the skin around it clean and dry. You may clean these areas with mild soap and water. Do not clean the area with peroxide, alcohol, or iodine.  If you have a wound, scrape, corn, or callus on your foot, look at it several times a day to make sure it is healing and not infected. Check for: ? Redness, swelling, or pain. ? Fluid or blood. ? Warmth. ? Pus or a bad smell. General instructions  Do not cross your legs. This may decrease blood flow to your feet.  Do not use heating pads or hot water bottles on your feet. They may burn your skin. If you have lost feeling in your feet or legs, you may not know this is happening until it is too late.  Protect your feet from hot and cold by wearing shoes, such as at the beach or on hot pavement.  Schedule a complete foot exam at least once a year (annually) or more often if you have foot problems. If you  have foot problems, report any cuts, sores, or bruises to your health care provider immediately. Contact a health care provider if:  You have a medical condition that increases your risk of infection and you have any cuts, sores, or bruises on your feet.  You have an injury that is not healing.  You have redness on your legs or feet.  You feel burning or tingling in your legs or feet.  You have pain or cramps in your legs and feet.  Your legs or feet are numb.  Your feet always feel cold.  You have pain around a toenail. Get help right away if:  You have a wound, scrape, corn, or callus on your foot and: ? You have pain, swelling, or redness that gets worse. ? You have  fluid or blood coming from the wound, scrape, corn, or callus. ? Your wound, scrape, corn, or callus feels warm to the touch. ? You have pus or a bad smell coming from the wound, scrape, corn, or callus. ? You have a fever. ? You have a red line going up your leg. Summary  Check your feet every day for cuts, sores, red spots, swelling, and blisters.  Moisturize feet and legs daily.  Wear shoes that fit properly and have enough cushioning.  If you have foot problems, report any cuts, sores, or bruises to your health care provider immediately.  Schedule a complete foot exam at least once a year (annually) or more often if you have foot problems. This information is not intended to replace advice given to you by your health care provider. Make sure you discuss any questions you have with your health care provider. Document Released: 10/19/2000 Document Revised: 12/04/2017 Document Reviewed: 11/23/2016 Elsevier Interactive Patient Education  2019 [employer].

## 2019-04-11 ENCOUNTER — Encounter: Payer: Self-pay | Admitting: Family Medicine

## 2019-04-13 ENCOUNTER — Encounter (INDEPENDENT_AMBULATORY_CARE_PROVIDER_SITE_OTHER): Payer: Self-pay | Admitting: Bariatrics

## 2019-04-13 DIAGNOSIS — L570 Actinic keratosis: Secondary | ICD-10-CM | POA: Diagnosis not present

## 2019-04-13 DIAGNOSIS — L409 Psoriasis, unspecified: Secondary | ICD-10-CM | POA: Diagnosis not present

## 2019-04-13 MED ORDER — PREGABALIN 150 MG PO CAPS: 150.0000 mg | ORAL_CAPSULE | Freq: Three times a day (TID) | ORAL | 1 refills | Status: DC

## 2019-04-13 NOTE — Telephone Encounter (Signed)
Requesting:lyrica Contract:no UDS:n/a Last OV:04/08/19 Next OV:06/12/19 Last Refill:03/16/19  #[id number] Database:   Please advise

## 2019-04-20 DIAGNOSIS — H401131 Primary open-angle glaucoma, bilateral, mild stage: Secondary | ICD-10-CM | POA: Diagnosis not present

## 2019-04-21 ENCOUNTER — Other Ambulatory Visit: Payer: Self-pay

## 2019-04-21 ENCOUNTER — Ambulatory Visit (INDEPENDENT_AMBULATORY_CARE_PROVIDER_SITE_OTHER): Payer: Medicare Other | Admitting: Bariatrics

## 2019-04-21 ENCOUNTER — Encounter (INDEPENDENT_AMBULATORY_CARE_PROVIDER_SITE_OTHER): Payer: Self-pay | Admitting: Bariatrics

## 2019-04-21 DIAGNOSIS — Z6831 Body mass index (BMI) 31.0-31.9, adult: Secondary | ICD-10-CM

## 2019-04-21 DIAGNOSIS — E119 Type 2 diabetes mellitus without complications: Secondary | ICD-10-CM

## 2019-04-21 DIAGNOSIS — F3289 Other specified depressive episodes: Secondary | ICD-10-CM | POA: Diagnosis not present

## 2019-04-21 DIAGNOSIS — E669 Obesity, unspecified: Secondary | ICD-10-CM | POA: Diagnosis not present

## 2019-04-21 MED ORDER — BUPROPION HCL ER (SR) 150 MG PO TB12: 150.0000 mg | ORAL_TABLET | Freq: Every day | ORAL | 0 refills | Status: DC

## 2019-04-22 ENCOUNTER — Ambulatory Visit (INDEPENDENT_AMBULATORY_CARE_PROVIDER_SITE_OTHER): Payer: Medicare Other | Admitting: Bariatrics
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
## 2019-04-24 ENCOUNTER — Ambulatory Visit: Payer: Medicare Other | Admitting: Podiatry

## 2019-04-24 ENCOUNTER — Other Ambulatory Visit: Payer: Self-pay

## 2019-04-24 ENCOUNTER — Encounter: Payer: Self-pay | Admitting: Podiatry

## 2019-04-24 ENCOUNTER — Ambulatory Visit (INDEPENDENT_AMBULATORY_CARE_PROVIDER_SITE_OTHER): Payer: Medicare Other

## 2019-04-24 ENCOUNTER — Other Ambulatory Visit: Payer: Self-pay | Admitting: Podiatry

## 2019-04-24 VITALS — Temp 97.2°F

## 2019-04-24 DIAGNOSIS — L03116 Cellulitis of left lower limb: Secondary | ICD-10-CM | POA: Diagnosis not present

## 2019-04-24 DIAGNOSIS — E11621 Type 2 diabetes mellitus with foot ulcer: Secondary | ICD-10-CM

## 2019-04-24 DIAGNOSIS — L97529 Non-pressure chronic ulcer of other part of left foot with unspecified severity: Secondary | ICD-10-CM | POA: Diagnosis not present

## 2019-04-24 MED ORDER — DOXYCYCLINE HYCLATE 100 MG PO TABS: 100.0000 mg | ORAL_TABLET | Freq: Two times a day (BID) | ORAL | 0 refills | Status: DC

## 2019-04-25 LAB — CBC WITH DIFFERENTIAL/PLATELET
Absolute Monocytes: 580 cells/uL (ref 200–950)
Basophils Absolute: 41 cells/uL (ref 0–200)
Basophils Relative: 0.6 %
Eosinophils Absolute: 200 cells/uL (ref 15–500)
Eosinophils Relative: 2.9 %
HCT: 42 % (ref 38.5–50.0)
Hemoglobin: 14.1 g/dL (ref 13.2–17.1)
Lymphs Abs: 2243 cells/uL (ref 850–3900)
MCH: 30.3 pg (ref 27.0–33.0)
MCHC: 33.6 g/dL (ref 32.0–36.0)
MCV: 90.3 fL (ref 80.0–100.0)
MPV: 10.1 fL (ref 7.5–12.5)
Monocytes Relative: 8.4 %
Neutro Abs: 3836 cells/uL (ref 1500–7800)
Neutrophils Relative %: 55.6 %
Platelets: 215 10*3/uL (ref 140–400)
RBC: 4.65 10*6/uL (ref 4.20–5.80)
RDW: 12.4 % (ref 11.0–15.0)
Total Lymphocyte: 32.5 %
WBC: 6.9 10*3/uL (ref 3.8–10.8)

## 2019-04-25 LAB — BASIC METABOLIC PANEL
BUN: 23 mg/dL (ref 7–25)
CO2: 28 mmol/L (ref 20–32)
Calcium: 9.1 mg/dL (ref 8.6–10.3)
Chloride: 105 mmol/L (ref 98–110)
Creat: 0.91 mg/dL (ref 0.70–1.18)
Glucose, Bld: 124 mg/dL — ABNORMAL HIGH (ref 65–99)
Potassium: 4.6 mmol/L (ref 3.5–5.3)
Sodium: 142 mmol/L (ref 135–146)

## 2019-04-25 LAB — SEDIMENTATION RATE: Sed Rate: 9 mm/h (ref 0–20)

## 2019-04-25 LAB — HEMOGLOBIN A1C
Hgb A1c MFr Bld: 5.9 % of total Hgb — ABNORMAL HIGH (ref <5.7)
Mean Plasma Glucose: 123 (calc)
eAG (mmol/L): 6.8 (calc)

## 2019-04-25 LAB — C-REACTIVE PROTEIN: CRP: 6.7 mg/L (ref <8.0)

## 2019-04-27 ENCOUNTER — Ambulatory Visit: Payer: Medicare Other | Admitting: Podiatry

## 2019-04-27 LAB — WOUND CULTURE
MICRO NUMBER:: 588653
SPECIMEN QUALITY:: ADEQUATE

## 2019-05-01 ENCOUNTER — Telehealth: Payer: Self-pay | Admitting: Family Medicine

## 2019-05-01 ENCOUNTER — Encounter: Payer: Self-pay | Admitting: Family Medicine

## 2019-05-01 ENCOUNTER — Other Ambulatory Visit: Payer: Self-pay

## 2019-05-01 ENCOUNTER — Encounter: Payer: Self-pay | Admitting: Podiatry

## 2019-05-01 ENCOUNTER — Ambulatory Visit: Payer: Medicare Other | Admitting: Podiatry

## 2019-05-01 DIAGNOSIS — L97529 Non-pressure chronic ulcer of other part of left foot with unspecified severity: Secondary | ICD-10-CM | POA: Diagnosis not present

## 2019-05-01 DIAGNOSIS — E11621 Type 2 diabetes mellitus with foot ulcer: Secondary | ICD-10-CM

## 2019-05-01 NOTE — Progress Notes (Addendum)
Subjective: 73 year old male presents the office today for evaluation of a wound on his left big toe.  He presents today with his wife.  He feels his wound is getting worse.  He has been using the medihoney on the wound but debrided out and started using antibiotic ointment.  It has been draining.  He states that he did have some increased redness and swelling couple days ago but it did come down some.  He has a bit of there is some warmth of the foot previously.  Currently denies any fevers, chills, nausea, vomiting.  No calf pain, chest pain, shortness of breath.  Objective: AAO x3, NAD DP/PT pulses palpable bilaterally, On the plantar aspect of the left hallux is a hyperkeratotic lesion there is a central wound.  The wound has increased in size to approximately 1 x 0.7 cm and superficial however there is some fibrotic tissue present in the wound as well.  There is localized surrounding erythema and edema but there is no ascending cellulitis.  No increase in warmth of the foot but there is mild increase in warmth directly around the area.  Some amount of clear of bloody drainage but there is no purulence.  There is no fluctuation or crepitation.  No malodor. No open lesions or pre-ulcerative lesions.  No pain with calf compression, swelling, warmth, erythema  Assessment: Left hallux ulceration with localized cellulitis  Plan: -All treatment options discussed with the patient including all alternatives, risks, complications.  -Debrided the wound utilizing a 312 with scalpel down to healthy, granular tissue.  Switch to actisorb dressings daily. I took a wound culture data as well.  Ordered blood work including CBC, ESR, CRP.  Prescribe doxycycline.  Offloading. Dispensed a wedge offloading shoe.  -Monitor for any clinical signs or symptoms of infection and directed to call the office immediately should any occur or go to the ER.  Return in about 1 week (around 05/01/2019).  Trula Slade  DPM

## 2019-05-01 NOTE — Telephone Encounter (Signed)
Patient is calling for Dr. Randel Pigg to know if it is ok to get a pair of shoes from [facility] on [address].  The patient states that Dr. Randel Pigg needs to sign a form. It has been about 3 weeks. Please advise [phone]- [phone]

## 2019-05-03 NOTE — Progress Notes (Signed)
Subjective: 73 year old male presents the office today for evaluation of a wound on his left big toe.  He also presents today with his wife.  They state the toe is doing better.  Still gets some bloody drainage coming from it but no pus.  Swelling is also improved and no significant redness or any red streaks.  They have been keeping Actisorb on the wound daily. Currently denies any fevers, chills, nausea, vomiting.  No calf pain, chest pain, shortness of breath.  Objective: AAO x3, NAD DP/PT pulses palpable bilaterally, On the plantar aspect of the left hallux is a hyperkeratotic lesion there is a central wound.  Overall the wound is about the same in size however is more granular along the periphery of the wound with some fibrotic tissue present on the central aspect.  There is no probing to bone, undermining or tunneling.  There is no crepitation.  There is no significant edema to the toe.  No ascending cellulitis.  On the plantar aspect there is a longitudinal skin fissure present.  No probing, undermining or tunneling.  See procedure below.    No pain with calf compression, swelling, warmth, erythema  Assessment: Left hallux ulceration with resolving infection  Plan: -All treatment options discussed with the patient including all alternatives, risks, complications.  -Debrided the wound utilizing a 312 with scalpel down to healthy, granular tissue. Actisorb daily.  If starts to get to dry we can switch to Silvadene.,  The use antibiotic ointment on the skin fissure.  Continue offloading shoe.  Elevation. He states he has been trying to offload his foot more which is also been helpful. -Reviewed blood work as well as wound culture today. -Dr. Elisha Ponder had previously ordered diabetic shoes for which he has been measured.  Awaiting insurance approval.  Return in about 10 days (around 05/11/2019).  [name]

## 2019-05-05 ENCOUNTER — Telehealth (INDEPENDENT_AMBULATORY_CARE_PROVIDER_SITE_OTHER): Payer: Medicare Other | Admitting: Bariatrics

## 2019-05-05 ENCOUNTER — Encounter (INDEPENDENT_AMBULATORY_CARE_PROVIDER_SITE_OTHER): Payer: Self-pay | Admitting: Bariatrics

## 2019-05-05 ENCOUNTER — Other Ambulatory Visit: Payer: Self-pay

## 2019-05-05 DIAGNOSIS — E669 Obesity, unspecified: Secondary | ICD-10-CM

## 2019-05-05 DIAGNOSIS — Z6834 Body mass index (BMI) 34.0-34.9, adult: Secondary | ICD-10-CM | POA: Diagnosis not present

## 2019-05-05 DIAGNOSIS — E119 Type 2 diabetes mellitus without complications: Secondary | ICD-10-CM | POA: Diagnosis not present

## 2019-05-05 DIAGNOSIS — F3289 Other specified depressive episodes: Secondary | ICD-10-CM | POA: Diagnosis not present

## 2019-05-05 MED ORDER — BUPROPION HCL ER (SR) 150 MG PO TB12: 150.0000 mg | ORAL_TABLET | Freq: Every day | ORAL | 0 refills | Status: DC

## 2019-05-05 NOTE — Telephone Encounter (Signed)
The paperwork has been completed and faxed back to [facility]
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
## 2019-05-11 ENCOUNTER — Telehealth: Payer: Self-pay

## 2019-05-11 NOTE — Telephone Encounter (Signed)
Called number provided was placed on hold 10 minutes hung up will try again later.    Copied from [place] [phone]. Topic: General - Inquiry >> May 07, 2019 10:39 AM Virl Axe D wrote: Reason for CRM: Liane Comber with [employer] stated they were forwarded an order from [facility] (they were out of pt's network.) They are missing information from order received and requesting a callback. They need to know the products that were requested for pt and a complete wound care assessment. Please advise. CB# [phone]

## 2019-05-12 ENCOUNTER — Encounter: Payer: Self-pay | Admitting: Podiatry

## 2019-05-12 ENCOUNTER — Encounter: Payer: Self-pay | Admitting: Family Medicine

## 2019-05-12 ENCOUNTER — Ambulatory Visit: Payer: Medicare Other | Admitting: Podiatry

## 2019-05-12 ENCOUNTER — Other Ambulatory Visit: Payer: Self-pay

## 2019-05-12 DIAGNOSIS — L97529 Non-pressure chronic ulcer of other part of left foot with unspecified severity: Secondary | ICD-10-CM | POA: Diagnosis not present

## 2019-05-12 DIAGNOSIS — E1142 Type 2 diabetes mellitus with diabetic polyneuropathy: Secondary | ICD-10-CM

## 2019-05-12 DIAGNOSIS — E11621 Type 2 diabetes mellitus with foot ulcer: Secondary | ICD-10-CM | POA: Diagnosis not present

## 2019-05-12 MED ORDER — DOXYCYCLINE HYCLATE 100 MG PO TABS: 100.0000 mg | ORAL_TABLET | Freq: Two times a day (BID) | ORAL | 0 refills | Status: DC

## 2019-05-13 ENCOUNTER — Telehealth: Payer: Self-pay | Admitting: *Deleted

## 2019-05-13 ENCOUNTER — Telehealth: Payer: Self-pay | Admitting: Podiatry

## 2019-05-13 NOTE — Telephone Encounter (Signed)
Please call [facility] about orders that had been faxed over for [name].

## 2019-05-13 NOTE — Progress Notes (Signed)
Subjective:   [name] presents today for continued care of ulceration of left hallux.  Patient has been performing daily dressing changes to {right,left) foot daily utilizing Actisorb. Today, he relates pain along medial border of hallux where he has a large amount of calloused tissue.  Patient denies any fever, chills, nightsweats, nausea or vomiting.  His wife is present during the visit and states wound has looked better in the past. Wife states at times, [name] walks around without the surgical shoe.  Wife states they have been applying antibiotic ointment daily to crease below ulcer.  Medications reviewed in chart.  Allergies  Allergen Reactions  . No Known Allergies      Objective:   Vascular Examination: DP pulses 0/4 left, 2/4 right  PT pulses 1/4 left, 2/4 right.  Digital hair absent b/l.  No ischemia.  Skin temperature gradient WNL.  Dermatological Examination: Skin with normal turgor, texture and tone b/l.  Ulceration located plantar aspect left hallux.  Predebridement measurements carried out today of 1.8 x 1.6 x 0.2 cm with.  A significant amount of hyperkeratosis surrounds border. Base if fibrogranular.  There is no purulence, no drainage.  No malodor. There is tenderness to the medal aspect of the hallux, but no fluid expressed. No abscess appreciated. There is what appears to be cat hair in the wound (they do admit to owning a cat).        Postdebridement measurements today are: 1.8 x 1.7 x 0.2 cm.  No erythema, no tunneling, no undermining. Good bleeding with debridement.      Neurological Examination: Senation diminished with 10 gram monofilament b/l.  Vibratory sensation diminished b/l.  Assessment:   1.  Diabetic Ulceration left hallux 2.    NIDDM with peripheral neuropathy  Plan: 1. Ulcer was debrided and reactive hyperkeratoses and necrotic tissue was resected to the level of bleeding or viable tissue. Ulcer was cleansed with  wound cleanser. Actisorb was applied to base of wound with light dressing. Wife to continue same dressing instructions as given by [name]. 2. Will cover him for contaminated wound since animal hair was discovered in wound. Rx sent for doxycycline 100 mg po bid x 10 days. 3. We did attempt to fit him for a short walking boot, but he did not feel stable with it, so his surgical shoe was offloaded again at ulceration site. 4. Patient was given instructions on offloading and dressing change/aftercare and was instructed to call immediately if any signs or symptoms of infection arise.  5. Patient is to follow up next week with [name]. 6. Patient instructed to report to emergency department with worsening appearance of ulcer/toe/foot, increased pain, foul odor, increased redness, swelling, drainage, fever, chills, nightsweats, nausea, vomiting, increased blood sugar.  7. Patient/POA related understanding.

## 2019-05-13 NOTE — Telephone Encounter (Signed)
[facility] - [name] states she thinks they received an order from our office.

## 2019-05-15 ENCOUNTER — Other Ambulatory Visit: Payer: Self-pay | Admitting: Orthopedic Surgery

## 2019-05-15 DIAGNOSIS — M25511 Pain in right shoulder: Secondary | ICD-10-CM

## 2019-05-15 DIAGNOSIS — M75101 Unspecified rotator cuff tear or rupture of right shoulder, not specified as traumatic: Secondary | ICD-10-CM

## 2019-05-16 ENCOUNTER — Other Ambulatory Visit: Payer: Self-pay | Admitting: Family Medicine

## 2019-05-19 ENCOUNTER — Ambulatory Visit (INDEPENDENT_AMBULATORY_CARE_PROVIDER_SITE_OTHER): Payer: Medicare Other

## 2019-05-19 ENCOUNTER — Other Ambulatory Visit: Payer: Self-pay

## 2019-05-19 ENCOUNTER — Ambulatory Visit: Payer: Medicare Other | Admitting: Podiatry

## 2019-05-19 ENCOUNTER — Telehealth (INDEPENDENT_AMBULATORY_CARE_PROVIDER_SITE_OTHER): Payer: Medicare Other | Admitting: Bariatrics

## 2019-05-19 ENCOUNTER — Encounter (INDEPENDENT_AMBULATORY_CARE_PROVIDER_SITE_OTHER): Payer: Self-pay | Admitting: Bariatrics

## 2019-05-19 DIAGNOSIS — F3289 Other specified depressive episodes: Secondary | ICD-10-CM

## 2019-05-19 DIAGNOSIS — L97529 Non-pressure chronic ulcer of other part of left foot with unspecified severity: Secondary | ICD-10-CM

## 2019-05-19 DIAGNOSIS — Z6834 Body mass index (BMI) 34.0-34.9, adult: Secondary | ICD-10-CM

## 2019-05-19 DIAGNOSIS — I739 Peripheral vascular disease, unspecified: Secondary | ICD-10-CM | POA: Diagnosis not present

## 2019-05-19 DIAGNOSIS — E11621 Type 2 diabetes mellitus with foot ulcer: Secondary | ICD-10-CM | POA: Diagnosis not present

## 2019-05-19 DIAGNOSIS — E669 Obesity, unspecified: Secondary | ICD-10-CM | POA: Diagnosis not present

## 2019-05-19 DIAGNOSIS — E1149 Type 2 diabetes mellitus with other diabetic neurological complication: Secondary | ICD-10-CM | POA: Diagnosis not present

## 2019-05-19 DIAGNOSIS — L03116 Cellulitis of left lower limb: Secondary | ICD-10-CM | POA: Diagnosis not present

## 2019-05-19 DIAGNOSIS — E1142 Type 2 diabetes mellitus with diabetic polyneuropathy: Secondary | ICD-10-CM

## 2019-05-19 MED ORDER — BUPROPION HCL ER (SR) 150 MG PO TB12: 150.0000 mg | ORAL_TABLET | Freq: Every day | ORAL | 0 refills | Status: DC
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
## 2019-05-20 ENCOUNTER — Telehealth: Payer: Self-pay | Admitting: *Deleted

## 2019-05-20 DIAGNOSIS — E11621 Type 2 diabetes mellitus with foot ulcer: Secondary | ICD-10-CM

## 2019-05-20 DIAGNOSIS — E1142 Type 2 diabetes mellitus with diabetic polyneuropathy: Secondary | ICD-10-CM

## 2019-05-20 DIAGNOSIS — I739 Peripheral vascular disease, unspecified: Secondary | ICD-10-CM

## 2019-05-20 NOTE — Progress Notes (Signed)
Subjective: 73 year old male presents the office today for evaluation of a wound on his left big toe. He presents today with his wife. He states that the wound is doing better. They have been applying actisorb daily. He was started on doxycycline last visit and still taking this. His wife reports that there has been minimal drainage. Only blood no pus. No red streaks. No significant swelling to the toe but today his wife noticed it is mildly red/pink in color. He keeps a bandage on the toe at all times.   Upon evaluation today I had previously felt pulses. I noticed that Dr. Elisha Ponder did not feel them well last visit. Upon talking to him he states that over the last month he has been getting a lot of cramps to his legs with the left worse than the right and pain to the legs. He has not had recent arterial studies. Currently denies any fevers, chills, nausea, vomiting.  No calf pain, chest pain, shortness of breath.  Objective: AAO x3, NAD- wearing shoe with offloading.  DP/PT pulses decreased bilaterally.  On the plantar aspect of the left hallux is a hyperkeratotic lesion there is a central wound. There is fibrotic tissue present in the wound prior to debridement. After debridement the wound measured 1.6 x 1.1.There are 2 areas were there was fibrotic tissue that probes about 0.2 cm. There is no probing to bone. There is no undermining or tunneling. There is no fluctuation or crepitation. No malodor. There is mild erythema to the toe and minimal warmth but no ascending cellulitis.  No pain with calf compression, swelling, warmth, erythema      Assessment: Left hallux ulceration with mild erythema  Plan: -All treatment options discussed with the patient including all alternatives, risks, complications.  -X-rays were obtained and reviewed with the patient. No cortical changes at this time and no soft tissue emphysema.  -Debrided the wound utilizing a 312 with scalpel down to healthy, granular  tissue. Continue with Actisorb daily. Continue offloading shoe.  Elevation. Continue to stay off of the foot as much as possible.  -Finish course of antibiotics, doxycyline.  -Disucssed risk of amputation again today.  -I am concerned about the circulation to the foot given the delayed healing of the wound. Also today he reports new symptoms of increasing leg pain, cramping. I am going to refer him to see vascular ASAP. We had a long discussion in regards to this today.  -Monitor for any clinical signs or symptoms of infection and directed to call the office immediately should any occur or go to the ER. -Reviewed blood work as well as wound culture today.  Return in about 1 week (around 05/26/2019).  [name]

## 2019-05-20 NOTE — Telephone Encounter (Signed)
Referral to [facility] as URGENT.

## 2019-05-20 NOTE — Telephone Encounter (Signed)
-----   Message from Trula Slade, DPM sent at 05/19/2019  6:22 PM EDT ----- Can you put in an urgent referral to either Dr. Gwenlyn Found or [facility] for nonhealing wound, PAD? He is know to both practices and whoever can get him in the quickest. He has been getting cramping in the legs over the last month.

## 2019-05-21 IMAGING — CT CT CTA ABD/PEL W/CM AND/OR W/O CM
2 of 6 series · 15 of 46 positions shown, 17 images · IV contrast (APPLIED)
Comparison: 04/17/2017

CLINICAL DATA: Abdominal aortic aneurysm

EXAM:
CTA ABDOMEN AND PELVIS wITHOUT AND WITH CONTRAST
TECHNIQUE: Multidetector CT imaging of the abdomen and pelvis was performed
using the standard protocol during bolus administration of
intravenous contrast. Multiplanar reconstructed images and MIPs were
obtained and reviewed to evaluate the vascular anatomy.
CONTRAST:  100mL [id number] IOPAMIDOL ([id number]-[id number]) INJECTION 76%

[Series 4: axial arterial · axial · arterial · 0.87mm/px · z∈[-642,-228]mm · 12 of 162 slices shown, 14 images]
[im 12/162  soft-tissue]
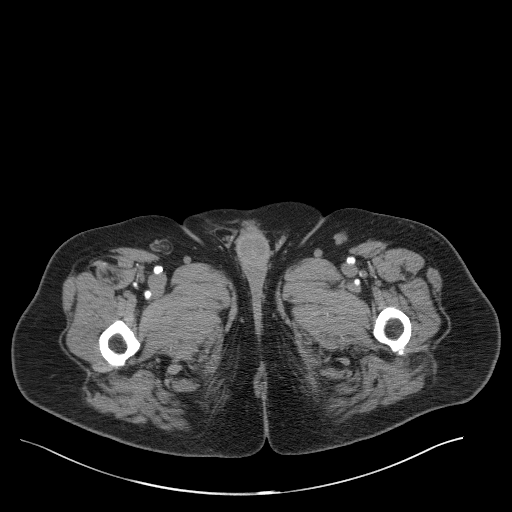
[im 12/162  bone]
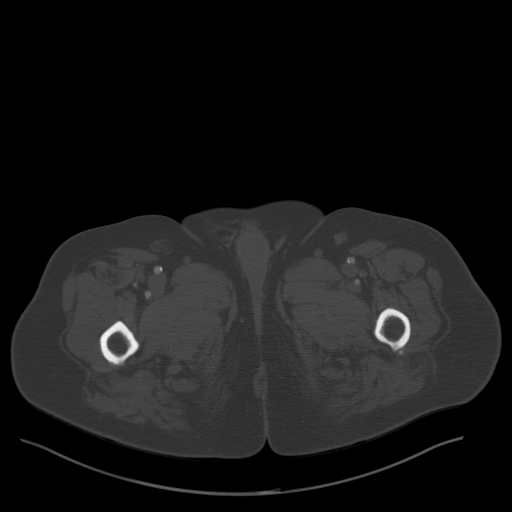
[im 24/162  soft-tissue]
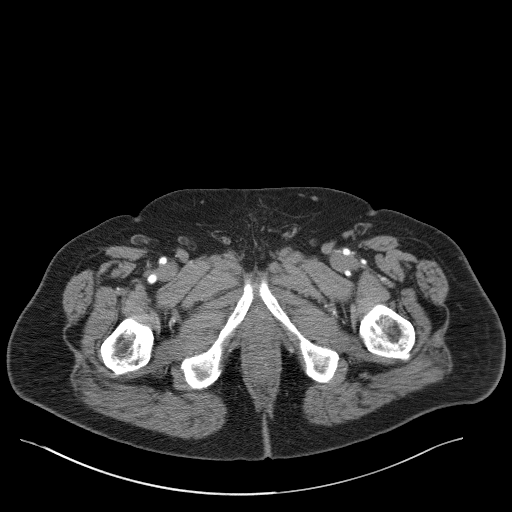
[im 36/162  soft-tissue]
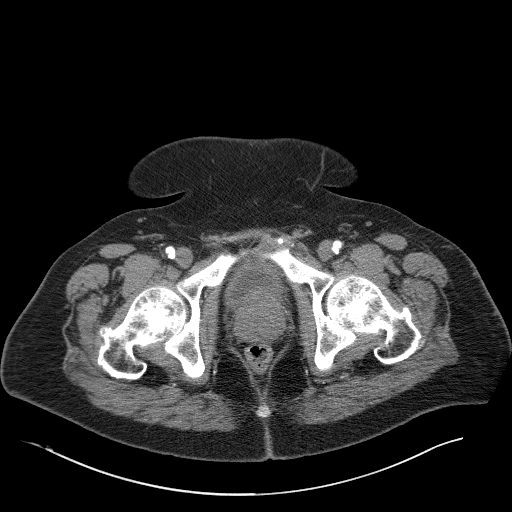
[im 48/162  soft-tissue]
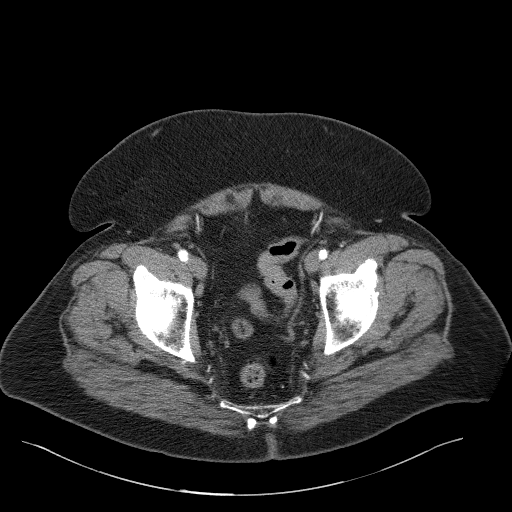
[im 60/162  soft-tissue]
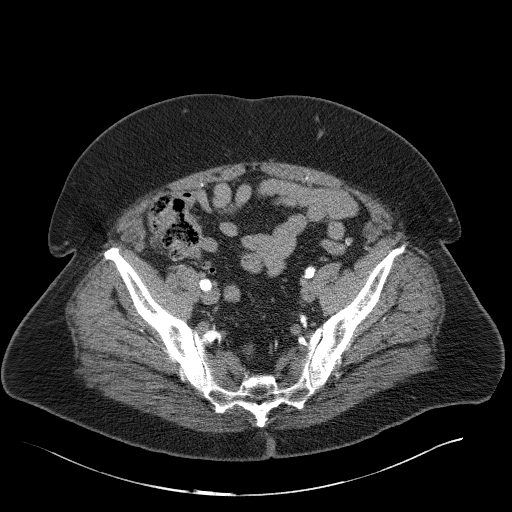
[im 72/162  soft-tissue]
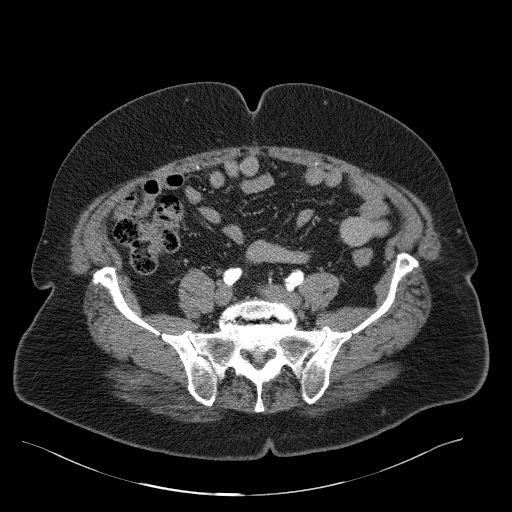
[im 90/162  soft-tissue]
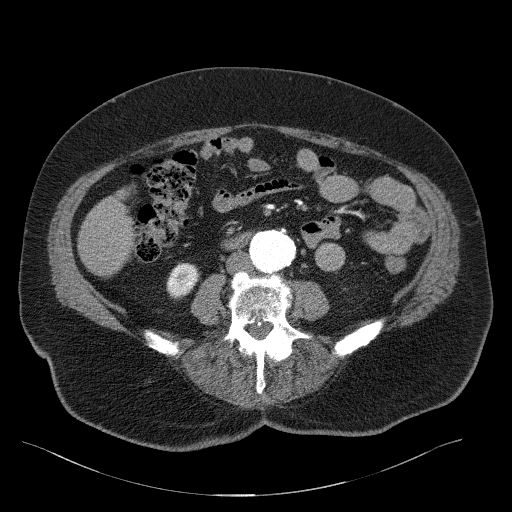
[im 102/162  soft-tissue]
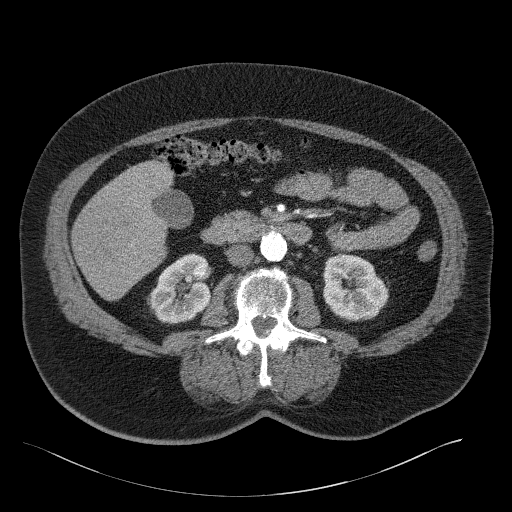
[im 114/162  soft-tissue]
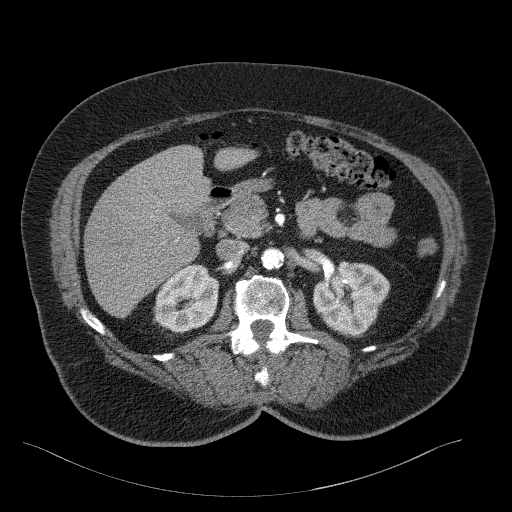
[im 114/162  bone]
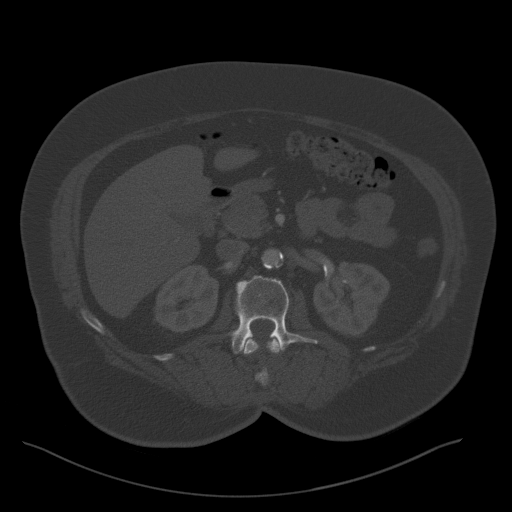
[im 126/162  soft-tissue]
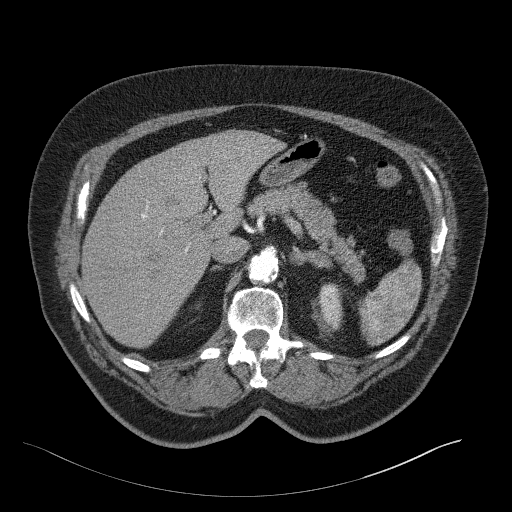
[im 138/162  soft-tissue]
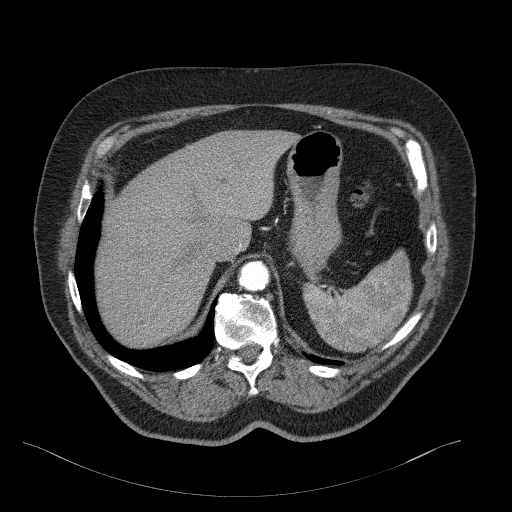
[im 150/162  soft-tissue]
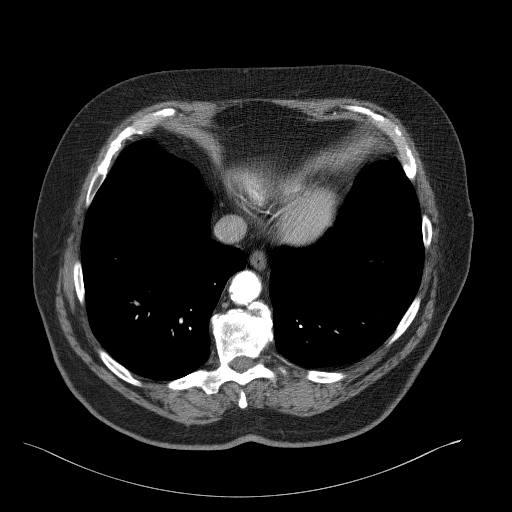

[Series 7: coronal · coronal · 0.77mm/px · 3 of 103 slices shown]
[im 26/103  soft-tissue]
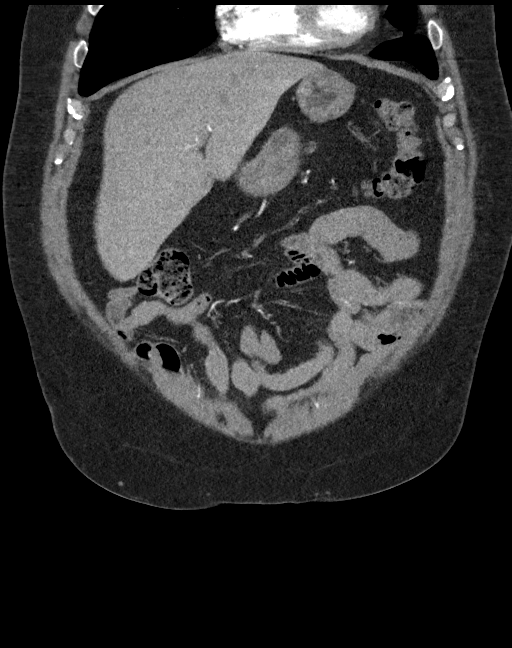
[im 52/103  soft-tissue]
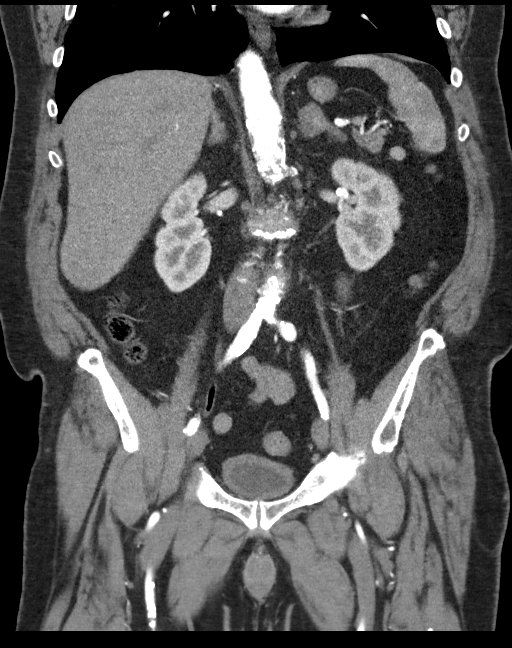
[im 77/103  soft-tissue]
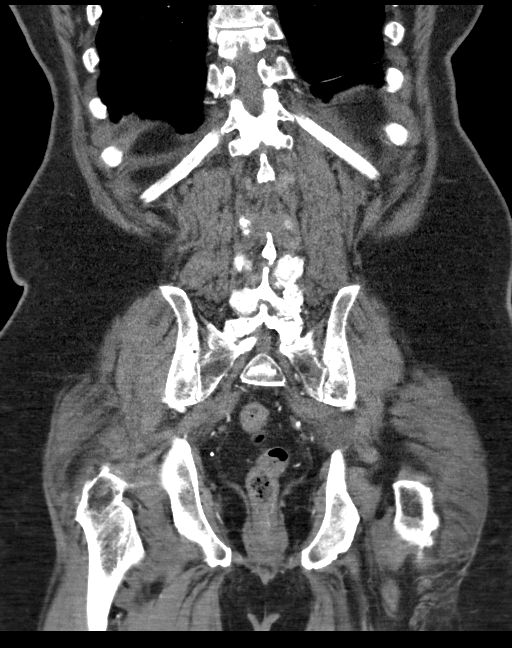

[15 of 46 positions shown; findings below may reference images not displayed]

FINDINGS: VASCULAR

Aorta: There is calcified and irregular soft plaque within the upper
abdominal aorta. There is no significant luminal narrowing.
Aneurysmal dilatation of the infrarenal aorta is noted. Maximal AP
and transverse diameters are 3.7 and 4.0 cm compared with 4.0 cm on
the prior study. This is stable.

Celiac: There is 50% narrowing at the origin of the celiac axis
secondary to soft and calcified atherosclerotic plaque. Branch
vessels are patent.

SMA: Patent.  Replaced right hepatic artery anatomy is noted.

Renals: Significant bilateral renal artery stenosis is again noted.

IMA: Patent.

Inflow: There are diffuse atherosclerotic calcifications throughout
the bilateral common, internal, and external iliac arteries.
Significant narrowing at the origin of the left common iliac artery
is suspected. There is poststenotic dilatation in the left common
iliac artery. The iliac arterial system is otherwise grossly patent.

Proximal Outflow: Grossly patent bilaterally with atherosclerotic
calcifications.

Review of the MIP images confirms the above findings.

NON-VASCULAR

Lower chest: No acute abnormality.

Hepatobiliary: Unremarkable

Pancreas: Unremarkable

Spleen: Small splenule.  Spleen is within normal limits.

Adrenals/Urinary Tract: 2.1 cm left adrenal mass is stable compared
with remote imaging. Right adrenal gland is within normal limits.
There are several calculi in the collecting system of both kidneys.
Largest on the right in the lower pole measures 7.5 mm. Multiple
tiny calculi are present in the collecting system of the left
kidney. Bladder is within normal limits.

Stomach/Bowel: Normal appendix. No obvious mass in the colon. No
evidence of small-bowel obstruction.

Lymphatic: No abnormal retroperitoneal adenopathy.

Reproductive: Prostate is enlarged. It extends into the base of the
bladder.

Other: No free fluid.

Musculoskeletal: Postoperative changes at L2-3 are noted
characterized by decompression and resection of the right synovial
cyst. There is now gas in the right lateral recess of L5-S1. This
may represent a gas filled disc extrusion.
IMPRESSION: VASCULAR

Maximal diameter of the abdominal aorta is 4.0 cm which is stable.

50% narrowing at the origin of the celiac axis is stable.

Bilateral renal artery stenosis is stable.

Significant stenosis at the origin of the left common iliac artery
is suspected.

NON-VASCULAR

Stable left adrenal mass supporting benign etiology.

Bilateral nephrolithiasis.

Prostate enlargement.  Correlate with physical exam and PSA.

New gas filled right paracentral disc extrusion is suspected at
L5-S1. MRI can be performed to further characterize.

Status post resection of the right L2-3 synovial cyst with
decompression.

## 2019-05-21 NOTE — Telephone Encounter (Signed)
Patient was in the office on Tuesday of this week to see Dr Jacqualyn Posey. [name]

## 2019-05-22 ENCOUNTER — Telehealth: Payer: Self-pay | Admitting: *Deleted

## 2019-05-22 NOTE — Telephone Encounter (Signed)
Pt states he has not heard from the cardiologist.

## 2019-05-22 NOTE — Telephone Encounter (Signed)
Left message informing pt the referral had been ordered 05/20/2019 and he could call to schedule [phone].

## 2019-05-25 ENCOUNTER — Encounter (HOSPITAL_BASED_OUTPATIENT_CLINIC_OR_DEPARTMENT_OTHER): Payer: Self-pay

## 2019-05-25 ENCOUNTER — Ambulatory Visit (INDEPENDENT_AMBULATORY_CARE_PROVIDER_SITE_OTHER): Payer: Medicare Other | Admitting: Podiatry

## 2019-05-25 ENCOUNTER — Other Ambulatory Visit: Payer: Self-pay

## 2019-05-25 ENCOUNTER — Inpatient Hospital Stay (HOSPITAL_BASED_OUTPATIENT_CLINIC_OR_DEPARTMENT_OTHER)
Admission: EM | Admit: 2019-05-25 | Discharge: 2019-05-30 | DRG: 617 | Disposition: A | Discharge status: Home / Self Care | Payer: Medicare Other | Attending: Internal Medicine | Admitting: Internal Medicine

## 2019-05-25 ENCOUNTER — Ambulatory Visit (INDEPENDENT_AMBULATORY_CARE_PROVIDER_SITE_OTHER): Payer: Medicare Other

## 2019-05-25 ENCOUNTER — Encounter (HOSPITAL_COMMUNITY): Payer: Self-pay

## 2019-05-25 ENCOUNTER — Emergency Department (HOSPITAL_COMMUNITY)
Admission: EM | Admit: 2019-05-25 | Discharge: 2019-05-25 | Discharge status: Against Medical Advice | Payer: Medicare Other | Source: Home / Self Care

## 2019-05-25 ENCOUNTER — Encounter: Payer: Self-pay | Admitting: Podiatry

## 2019-05-25 VITALS — Temp 98.0°F

## 2019-05-25 DIAGNOSIS — Z1159 Encounter for screening for other viral diseases: Secondary | ICD-10-CM

## 2019-05-25 DIAGNOSIS — L97529 Non-pressure chronic ulcer of other part of left foot with unspecified severity: Secondary | ICD-10-CM

## 2019-05-25 DIAGNOSIS — Z8679 Personal history of other diseases of the circulatory system: Secondary | ICD-10-CM

## 2019-05-25 DIAGNOSIS — J449 Chronic obstructive pulmonary disease, unspecified: Secondary | ICD-10-CM | POA: Diagnosis not present

## 2019-05-25 DIAGNOSIS — H9190 Unspecified hearing loss, unspecified ear: Secondary | ICD-10-CM | POA: Diagnosis present

## 2019-05-25 DIAGNOSIS — Z803 Family history of malignant neoplasm of breast: Secondary | ICD-10-CM

## 2019-05-25 DIAGNOSIS — Z7982 Long term (current) use of aspirin: Secondary | ICD-10-CM

## 2019-05-25 DIAGNOSIS — I70202 Unspecified atherosclerosis of native arteries of extremities, left leg: Secondary | ICD-10-CM | POA: Diagnosis not present

## 2019-05-25 DIAGNOSIS — I998 Other disorder of circulatory system: Secondary | ICD-10-CM | POA: Diagnosis present

## 2019-05-25 DIAGNOSIS — F419 Anxiety disorder, unspecified: Secondary | ICD-10-CM | POA: Diagnosis present

## 2019-05-25 DIAGNOSIS — E118 Type 2 diabetes mellitus with unspecified complications: Secondary | ICD-10-CM | POA: Diagnosis present

## 2019-05-25 DIAGNOSIS — I70229 Atherosclerosis of native arteries of extremities with rest pain, unspecified extremity: Secondary | ICD-10-CM | POA: Diagnosis present

## 2019-05-25 DIAGNOSIS — E1149 Type 2 diabetes mellitus with other diabetic neurological complication: Secondary | ICD-10-CM | POA: Diagnosis present

## 2019-05-25 DIAGNOSIS — M1990 Unspecified osteoarthritis, unspecified site: Secondary | ICD-10-CM | POA: Diagnosis not present

## 2019-05-25 DIAGNOSIS — J441 Chronic obstructive pulmonary disease with (acute) exacerbation: Secondary | ICD-10-CM | POA: Diagnosis present

## 2019-05-25 DIAGNOSIS — I152 Hypertension secondary to endocrine disorders: Secondary | ICD-10-CM | POA: Diagnosis present

## 2019-05-25 DIAGNOSIS — E785 Hyperlipidemia, unspecified: Secondary | ICD-10-CM | POA: Diagnosis present

## 2019-05-25 DIAGNOSIS — F32A Depression, unspecified: Secondary | ICD-10-CM | POA: Diagnosis present

## 2019-05-25 DIAGNOSIS — I10 Essential (primary) hypertension: Secondary | ICD-10-CM | POA: Diagnosis not present

## 2019-05-25 DIAGNOSIS — Z7984 Long term (current) use of oral hypoglycemic drugs: Secondary | ICD-10-CM | POA: Diagnosis not present

## 2019-05-25 DIAGNOSIS — Z833 Family history of diabetes mellitus: Secondary | ICD-10-CM

## 2019-05-25 DIAGNOSIS — Z8249 Family history of ischemic heart disease and other diseases of the circulatory system: Secondary | ICD-10-CM

## 2019-05-25 DIAGNOSIS — E1159 Type 2 diabetes mellitus with other circulatory complications: Secondary | ICD-10-CM | POA: Diagnosis present

## 2019-05-25 DIAGNOSIS — Z03818 Encounter for observation for suspected exposure to other biological agents ruled out: Secondary | ICD-10-CM | POA: Diagnosis not present

## 2019-05-25 DIAGNOSIS — H409 Unspecified glaucoma: Secondary | ICD-10-CM | POA: Diagnosis not present

## 2019-05-25 DIAGNOSIS — E1169 Type 2 diabetes mellitus with other specified complication: Principal | ICD-10-CM | POA: Diagnosis present

## 2019-05-25 DIAGNOSIS — Z9842 Cataract extraction status, left eye: Secondary | ICD-10-CM

## 2019-05-25 DIAGNOSIS — M545 Low back pain: Secondary | ICD-10-CM | POA: Diagnosis present

## 2019-05-25 DIAGNOSIS — F418 Other specified anxiety disorders: Secondary | ICD-10-CM | POA: Diagnosis not present

## 2019-05-25 DIAGNOSIS — G8929 Other chronic pain: Secondary | ICD-10-CM | POA: Diagnosis present

## 2019-05-25 DIAGNOSIS — L089 Local infection of the skin and subcutaneous tissue, unspecified: Secondary | ICD-10-CM | POA: Diagnosis not present

## 2019-05-25 DIAGNOSIS — S301XXA Contusion of abdominal wall, initial encounter: Secondary | ICD-10-CM | POA: Diagnosis not present

## 2019-05-25 DIAGNOSIS — M869 Osteomyelitis, unspecified: Secondary | ICD-10-CM

## 2019-05-25 DIAGNOSIS — G609 Hereditary and idiopathic neuropathy, unspecified: Secondary | ICD-10-CM | POA: Diagnosis present

## 2019-05-25 DIAGNOSIS — L03116 Cellulitis of left lower limb: Secondary | ICD-10-CM | POA: Diagnosis not present

## 2019-05-25 DIAGNOSIS — Z5321 Procedure and treatment not carried out due to patient leaving prior to being seen by health care provider: Secondary | ICD-10-CM | POA: Insufficient documentation

## 2019-05-25 DIAGNOSIS — M109 Gout, unspecified: Secondary | ICD-10-CM | POA: Diagnosis not present

## 2019-05-25 DIAGNOSIS — Z87442 Personal history of urinary calculi: Secondary | ICD-10-CM

## 2019-05-25 DIAGNOSIS — Z8673 Personal history of transient ischemic attack (TIA), and cerebral infarction without residual deficits: Secondary | ICD-10-CM

## 2019-05-25 DIAGNOSIS — E11621 Type 2 diabetes mellitus with foot ulcer: Secondary | ICD-10-CM

## 2019-05-25 DIAGNOSIS — E1151 Type 2 diabetes mellitus with diabetic peripheral angiopathy without gangrene: Secondary | ICD-10-CM | POA: Diagnosis not present

## 2019-05-25 DIAGNOSIS — Z87891 Personal history of nicotine dependence: Secondary | ICD-10-CM

## 2019-05-25 DIAGNOSIS — Z8701 Personal history of pneumonia (recurrent): Secondary | ICD-10-CM

## 2019-05-25 DIAGNOSIS — Z972 Presence of dental prosthetic device (complete) (partial): Secondary | ICD-10-CM

## 2019-05-25 DIAGNOSIS — F329 Major depressive disorder, single episode, unspecified: Secondary | ICD-10-CM | POA: Diagnosis present

## 2019-05-25 DIAGNOSIS — Z79899 Other long term (current) drug therapy: Secondary | ICD-10-CM | POA: Diagnosis not present

## 2019-05-25 DIAGNOSIS — E1142 Type 2 diabetes mellitus with diabetic polyneuropathy: Secondary | ICD-10-CM

## 2019-05-25 DIAGNOSIS — Z9841 Cataract extraction status, right eye: Secondary | ICD-10-CM

## 2019-05-25 DIAGNOSIS — K219 Gastro-esophageal reflux disease without esophagitis: Secondary | ICD-10-CM | POA: Diagnosis not present

## 2019-05-25 DIAGNOSIS — E119 Type 2 diabetes mellitus without complications: Secondary | ICD-10-CM | POA: Diagnosis present

## 2019-05-25 DIAGNOSIS — Z961 Presence of intraocular lens: Secondary | ICD-10-CM | POA: Diagnosis present

## 2019-05-25 LAB — CBC WITH DIFFERENTIAL/PLATELET
Abs Immature Granulocytes: 0.06 10*3/uL (ref 0.00–0.07)
Basophils Absolute: 0 10*3/uL (ref 0.0–0.1)
Basophils Relative: 0 %
Eosinophils Absolute: 0.3 10*3/uL (ref 0.0–0.5)
Eosinophils Relative: 3 %
HCT: 44.2 % (ref 39.0–52.0)
Hemoglobin: 14.2 g/dL (ref 13.0–17.0)
Immature Granulocytes: 1 %
Lymphocytes Relative: 25 %
Lymphs Abs: 2.5 10*3/uL (ref 0.7–4.0)
MCH: 30.2 pg (ref 26.0–34.0)
MCHC: 32.1 g/dL (ref 30.0–36.0)
MCV: 94 fL (ref 80.0–100.0)
Monocytes Absolute: 0.9 10*3/uL (ref 0.1–1.0)
Monocytes Relative: 9 %
Neutro Abs: 6.2 10*3/uL (ref 1.7–7.7)
Neutrophils Relative %: 62 %
Platelets: 208 10*3/uL (ref 150–400)
RBC: 4.7 MIL/uL (ref 4.22–5.81)
RDW: 13.8 % (ref 11.5–15.5)
WBC: 9.9 10*3/uL (ref 4.0–10.5)
nRBC: 0 % (ref 0.0–0.2)

## 2019-05-25 LAB — BASIC METABOLIC PANEL
Anion gap: 11 (ref 5–15)
BUN: 20 mg/dL (ref 8–23)
CO2: 26 mmol/L (ref 22–32)
Calcium: 9.3 mg/dL (ref 8.9–10.3)
Chloride: 103 mmol/L (ref 98–111)
Creatinine, Ser: 0.82 mg/dL (ref 0.61–1.24)
GFR calc Af Amer: >60 mL/min (ref >60)
GFR calc non Af Amer: >60 mL/min (ref >60)
Glucose, Bld: 84 mg/dL (ref 70–99)
Potassium: 3.9 mmol/L (ref 3.5–5.1)
Sodium: 140 mmol/L (ref 135–145)

## 2019-05-25 LAB — SARS CORONAVIRUS 2 BY RT PCR (HOSPITAL ORDER, PERFORMED IN ~~LOC~~ HOSPITAL LAB): SARS Coronavirus 2: NEGATIVE

## 2019-05-25 MED ORDER — VANCOMYCIN HCL IN DEXTROSE 1-5 GM/200ML-% IV SOLN
1000.0000 mg | Freq: Once | INTRAVENOUS | Status: AC
  Administered 2019-05-25: 1000 mg via INTRAVENOUS
  Filled 2019-05-25: qty 200

## 2019-05-25 NOTE — ED Notes (Signed)
Left great toe red and swollen  Has a calus cut off and it has gotten worse  Onset around 2 months ago

## 2019-05-25 NOTE — ED Triage Notes (Signed)
[facility] called to have pt chart dismissed; pt there.
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
## 2019-05-25 NOTE — Progress Notes (Signed)
Subjective: 73 year old male presents the office today for an acute appointment. He states that he noticed last night that he started to have increase in "fever" to the toe and redness. He has finished doxycycline. No increase in pain. He has no systemic symptoms such as fevers, chills, nausea, vomiting.   Objective: AAO x3, NAD- wearing shoe with offloading.  DP/PT pulses decreased bilaterally.  On the plantar aspect of the left hallux is a hyperkeratotic lesion there is a central wound.  There is increased erythema to the toe and there is increase in warmth of the foot.  There is no fluctuation crepitation.  There is no drainage or pus identified today. No pain with calf compression, swelling, warmth, erythema   Assessment: Left hallux ulceration with worsening erythema  Plan: -All treatment options discussed with the patient including all alternatives, risks, complications.  -X-rays were obtained and reviewed with the patient. No cortical changes at this time and no soft tissue emphysema.  There is a linear lucency in the proximal phalanx but this could correspond to the wound.  This is unchanged. -Given the worsening erythema and warmth I do recommend go to emergency room.  Started on IV antibiotics and blood work.  If admitted to the hospital I will be happy to follow him as a consult.  He has been on multiple rounds of antibiotics and that he comes off antibiotics infection recurrence.  Also given his history of hardware in the ankle as well hardware in the leg I am concerned infection could spread to the other joints. Recommend IV ABX at this point. I called [facility] triage nurse. He is going to the ER.   [name]

## 2019-05-25 NOTE — ED Triage Notes (Signed)
Pt states that has an infection in his left great toe. Pt was sent by podiatry.

## 2019-05-25 NOTE — ED Triage Notes (Addendum)
Pt states he was sent to ED by podiatrist for left great toe issues-states he was sent for IV abx-[facility] ED due to wait-NAD-steady gait

## 2019-05-26 ENCOUNTER — Encounter (HOSPITAL_COMMUNITY): Payer: Self-pay | Admitting: *Deleted

## 2019-05-26 ENCOUNTER — Ambulatory Visit: Payer: Medicare Other | Admitting: Podiatry

## 2019-05-26 ENCOUNTER — Inpatient Hospital Stay (HOSPITAL_COMMUNITY): Payer: Medicare Other

## 2019-05-26 DIAGNOSIS — G8929 Other chronic pain: Secondary | ICD-10-CM | POA: Diagnosis not present

## 2019-05-26 DIAGNOSIS — J449 Chronic obstructive pulmonary disease, unspecified: Secondary | ICD-10-CM

## 2019-05-26 DIAGNOSIS — S301XXA Contusion of abdominal wall, initial encounter: Secondary | ICD-10-CM | POA: Diagnosis not present

## 2019-05-26 DIAGNOSIS — E1149 Type 2 diabetes mellitus with other diabetic neurological complication: Secondary | ICD-10-CM

## 2019-05-26 DIAGNOSIS — E118 Type 2 diabetes mellitus with unspecified complications: Secondary | ICD-10-CM | POA: Diagnosis present

## 2019-05-26 DIAGNOSIS — L97529 Non-pressure chronic ulcer of other part of left foot with unspecified severity: Secondary | ICD-10-CM | POA: Diagnosis not present

## 2019-05-26 DIAGNOSIS — L03116 Cellulitis of left lower limb: Secondary | ICD-10-CM

## 2019-05-26 DIAGNOSIS — E1169 Type 2 diabetes mellitus with other specified complication: Secondary | ICD-10-CM | POA: Diagnosis not present

## 2019-05-26 DIAGNOSIS — E11621 Type 2 diabetes mellitus with foot ulcer: Secondary | ICD-10-CM | POA: Diagnosis not present

## 2019-05-26 DIAGNOSIS — K219 Gastro-esophageal reflux disease without esophagitis: Secondary | ICD-10-CM | POA: Diagnosis not present

## 2019-05-26 DIAGNOSIS — I6523 Occlusion and stenosis of bilateral carotid arteries: Secondary | ICD-10-CM | POA: Diagnosis not present

## 2019-05-26 DIAGNOSIS — M109 Gout, unspecified: Secondary | ICD-10-CM | POA: Diagnosis not present

## 2019-05-26 DIAGNOSIS — I70202 Unspecified atherosclerosis of native arteries of extremities, left leg: Secondary | ICD-10-CM | POA: Diagnosis not present

## 2019-05-26 DIAGNOSIS — I10 Essential (primary) hypertension: Secondary | ICD-10-CM | POA: Diagnosis not present

## 2019-05-26 DIAGNOSIS — H9190 Unspecified hearing loss, unspecified ear: Secondary | ICD-10-CM | POA: Diagnosis not present

## 2019-05-26 DIAGNOSIS — Z7984 Long term (current) use of oral hypoglycemic drugs: Secondary | ICD-10-CM | POA: Diagnosis not present

## 2019-05-26 DIAGNOSIS — E1151 Type 2 diabetes mellitus with diabetic peripheral angiopathy without gangrene: Secondary | ICD-10-CM | POA: Diagnosis not present

## 2019-05-26 DIAGNOSIS — L02612 Cutaneous abscess of left foot: Secondary | ICD-10-CM

## 2019-05-26 DIAGNOSIS — M86172 Other acute osteomyelitis, left ankle and foot: Secondary | ICD-10-CM | POA: Diagnosis not present

## 2019-05-26 DIAGNOSIS — Z1159 Encounter for screening for other viral diseases: Secondary | ICD-10-CM | POA: Diagnosis not present

## 2019-05-26 DIAGNOSIS — L03032 Cellulitis of left toe: Secondary | ICD-10-CM

## 2019-05-26 DIAGNOSIS — F418 Other specified anxiety disorders: Secondary | ICD-10-CM | POA: Diagnosis not present

## 2019-05-26 DIAGNOSIS — M545 Low back pain: Secondary | ICD-10-CM | POA: Diagnosis not present

## 2019-05-26 DIAGNOSIS — L089 Local infection of the skin and subcutaneous tissue, unspecified: Secondary | ICD-10-CM

## 2019-05-26 DIAGNOSIS — M869 Osteomyelitis, unspecified: Secondary | ICD-10-CM | POA: Diagnosis not present

## 2019-05-26 DIAGNOSIS — E785 Hyperlipidemia, unspecified: Secondary | ICD-10-CM

## 2019-05-26 DIAGNOSIS — I998 Other disorder of circulatory system: Secondary | ICD-10-CM | POA: Diagnosis not present

## 2019-05-26 DIAGNOSIS — Z7982 Long term (current) use of aspirin: Secondary | ICD-10-CM | POA: Diagnosis not present

## 2019-05-26 DIAGNOSIS — I70245 Atherosclerosis of native arteries of left leg with ulceration of other part of foot: Secondary | ICD-10-CM | POA: Diagnosis not present

## 2019-05-26 DIAGNOSIS — Z79899 Other long term (current) drug therapy: Secondary | ICD-10-CM | POA: Diagnosis not present

## 2019-05-26 DIAGNOSIS — G609 Hereditary and idiopathic neuropathy, unspecified: Secondary | ICD-10-CM | POA: Diagnosis present

## 2019-05-26 DIAGNOSIS — E119 Type 2 diabetes mellitus without complications: Secondary | ICD-10-CM | POA: Diagnosis present

## 2019-05-26 DIAGNOSIS — M86672 Other chronic osteomyelitis, left ankle and foot: Secondary | ICD-10-CM | POA: Diagnosis not present

## 2019-05-26 DIAGNOSIS — M1990 Unspecified osteoarthritis, unspecified site: Secondary | ICD-10-CM | POA: Diagnosis not present

## 2019-05-26 DIAGNOSIS — F329 Major depressive disorder, single episode, unspecified: Secondary | ICD-10-CM | POA: Diagnosis present

## 2019-05-26 DIAGNOSIS — F419 Anxiety disorder, unspecified: Secondary | ICD-10-CM | POA: Diagnosis present

## 2019-05-26 DIAGNOSIS — H409 Unspecified glaucoma: Secondary | ICD-10-CM | POA: Diagnosis not present

## 2019-05-26 DIAGNOSIS — I739 Peripheral vascular disease, unspecified: Secondary | ICD-10-CM | POA: Diagnosis not present

## 2019-05-26 DIAGNOSIS — Z8673 Personal history of transient ischemic attack (TIA), and cerebral infarction without residual deficits: Secondary | ICD-10-CM

## 2019-05-26 HISTORY — DX: Cutaneous abscess of left foot: L02.612

## 2019-05-26 HISTORY — DX: Cutaneous abscess of left foot: L03.032

## 2019-05-26 LAB — CBC
HCT: 42.5 % (ref 39.0–52.0)
Hemoglobin: 13.7 g/dL (ref 13.0–17.0)
MCH: 30 pg (ref 26.0–34.0)
MCHC: 32.2 g/dL (ref 30.0–36.0)
MCV: 93 fL (ref 80.0–100.0)
Platelets: 186 10*3/uL (ref 150–400)
RBC: 4.57 MIL/uL (ref 4.22–5.81)
RDW: 13.4 % (ref 11.5–15.5)
WBC: 8.2 10*3/uL (ref 4.0–10.5)
nRBC: 0 % (ref 0.0–0.2)

## 2019-05-26 LAB — BASIC METABOLIC PANEL
Anion gap: 7 (ref 5–15)
BUN: 17 mg/dL (ref 8–23)
CO2: 29 mmol/L (ref 22–32)
Calcium: 8.9 mg/dL (ref 8.9–10.3)
Chloride: 103 mmol/L (ref 98–111)
Creatinine, Ser: 0.72 mg/dL (ref 0.61–1.24)
GFR calc Af Amer: >60 mL/min (ref >60)
GFR calc non Af Amer: >60 mL/min (ref >60)
Glucose, Bld: 177 mg/dL — ABNORMAL HIGH (ref 70–99)
Potassium: 4.1 mmol/L (ref 3.5–5.1)
Sodium: 139 mmol/L (ref 135–145)

## 2019-05-26 LAB — GLUCOSE, CAPILLARY
Glucose-Capillary: 122 mg/dL — ABNORMAL HIGH (ref 70–99)
Glucose-Capillary: 114 mg/dL — ABNORMAL HIGH (ref 70–99)
Glucose-Capillary: 114 mg/dL — ABNORMAL HIGH (ref 70–99)
Glucose-Capillary: 136 mg/dL — ABNORMAL HIGH (ref 70–99)

## 2019-05-26 LAB — CBG MONITORING, ED: Glucose-Capillary: 231 mg/dL — ABNORMAL HIGH (ref 70–99)

## 2019-05-26 LAB — PROTIME-INR
INR: 1.1 (ref 0.8–1.2)
Prothrombin Time: 13.9 seconds (ref 11.4–15.2)

## 2019-05-26 LAB — C-REACTIVE PROTEIN: CRP: 1.7 mg/dL — ABNORMAL HIGH (ref <1.0)

## 2019-05-26 LAB — APTT: aPTT: 28 seconds (ref 24–36)

## 2019-05-26 LAB — SEDIMENTATION RATE: Sed Rate: 33 mm/hr — ABNORMAL HIGH (ref 0–16)

## 2019-05-26 MED ORDER — ACETAMINOPHEN 325 MG PO TABS: 650.0000 mg | ORAL_TABLET | Freq: Four times a day (QID) | ORAL | Status: DC | PRN

## 2019-05-26 MED ORDER — GADOBUTROL 1 MMOL/ML IV SOLN
10.0000 mL | Freq: Once | INTRAVENOUS | Status: AC | PRN
  Administered 2019-05-26: 10 mL via INTRAVENOUS

## 2019-05-26 MED ORDER — INSULIN ASPART 100 UNIT/ML ~~LOC~~ SOLN
0.0000 [IU] | Freq: Three times a day (TID) | SUBCUTANEOUS | Status: DC
  Administered 2019-05-26 – 2019-05-27 (×2): 1 [IU] via SUBCUTANEOUS
  Administered 2019-05-27 (×2): 2 [IU] via SUBCUTANEOUS
  Administered 2019-05-30: 1 [IU] via SUBCUTANEOUS

## 2019-05-26 MED ORDER — SENNOSIDES-DOCUSATE SODIUM 8.6-50 MG PO TABS: 1.0000 | ORAL_TABLET | Freq: Every evening | ORAL | Status: DC | PRN

## 2019-05-26 MED ORDER — VITAMIN C 500 MG PO TABS
1000.0000 mg | ORAL_TABLET | Freq: Every day | ORAL | Status: DC
  Administered 2019-05-26 – 2019-05-30 (×3): 1000 mg via ORAL
  Filled 2019-05-26 (×3): qty 2

## 2019-05-26 MED ORDER — HYDRALAZINE HCL 20 MG/ML IJ SOLN: 5.0000 mg | INTRAMUSCULAR | Status: DC | PRN

## 2019-05-26 MED ORDER — CARVEDILOL 12.5 MG PO TABS
12.5000 mg | ORAL_TABLET | Freq: Two times a day (BID) | ORAL | Status: DC
  Administered 2019-05-26 – 2019-05-30 (×6): 12.5 mg via ORAL
  Filled 2019-05-26 (×6): qty 1

## 2019-05-26 MED ORDER — LOSARTAN POTASSIUM 50 MG PO TABS
50.0000 mg | ORAL_TABLET | Freq: Every day | ORAL | Status: DC
  Administered 2019-05-26 – 2019-05-30 (×4): 50 mg via ORAL
  Filled 2019-05-26 (×4): qty 1

## 2019-05-26 MED ORDER — VITAMIN B-12 1000 MCG PO TABS: 1000.0000 ug | ORAL_TABLET | ORAL | Status: DC

## 2019-05-26 MED ORDER — MUPIROCIN CALCIUM 2 % EX CREA
TOPICAL_CREAM | Freq: Every day | CUTANEOUS | Status: DC
  Administered 2019-05-26 – 2019-05-29 (×4): via TOPICAL
  Filled 2019-05-26 (×2): qty 15

## 2019-05-26 MED ORDER — VITAMIN D 25 MCG (1000 UNIT) PO TABS
1000.0000 [IU] | ORAL_TABLET | Freq: Every day | ORAL | Status: DC
  Administered 2019-05-26 – 2019-05-30 (×3): 1000 [IU] via ORAL
  Filled 2019-05-26 (×3): qty 1

## 2019-05-26 MED ORDER — PREGABALIN 75 MG PO CAPS
150.0000 mg | ORAL_CAPSULE | Freq: Three times a day (TID) | ORAL | Status: DC
  Administered 2019-05-26 – 2019-05-30 (×12): 150 mg via ORAL
  Filled 2019-05-26 (×12): qty 2

## 2019-05-26 MED ORDER — OMEGA-3-ACID ETHYL ESTERS 1 G PO CAPS
1.0000 g | ORAL_CAPSULE | Freq: Every day | ORAL | Status: DC
  Administered 2019-05-26 – 2019-05-30 (×3): 1 g via ORAL
  Filled 2019-05-26 (×3): qty 1

## 2019-05-26 MED ORDER — CLONIDINE HCL 0.1 MG PO TABS: 0.1000 mg | ORAL_TABLET | Freq: Two times a day (BID) | ORAL | Status: DC

## 2019-05-26 MED ORDER — ACETAMINOPHEN 650 MG RE SUPP: 650.0000 mg | Freq: Four times a day (QID) | RECTAL | Status: DC | PRN

## 2019-05-26 MED ORDER — ATORVASTATIN CALCIUM 80 MG PO TABS
80.0000 mg | ORAL_TABLET | Freq: Every day | ORAL | Status: DC
  Administered 2019-05-26 – 2019-05-29 (×3): 80 mg via ORAL
  Filled 2019-05-26 (×3): qty 1

## 2019-05-26 MED ORDER — ALBUTEROL SULFATE (2.5 MG/3ML) 0.083% IN NEBU: 3.0000 mL | INHALATION_SOLUTION | RESPIRATORY_TRACT | Status: DC | PRN

## 2019-05-26 MED ORDER — ASPIRIN EC 81 MG PO TBEC
81.0000 mg | DELAYED_RELEASE_TABLET | Freq: Every day | ORAL | Status: DC
  Administered 2019-05-26 – 2019-05-29 (×5): 81 mg via ORAL
  Filled 2019-05-26 (×5): qty 1

## 2019-05-26 MED ORDER — SODIUM CHLORIDE 0.9 % IV SOLN
1.0000 g | INTRAVENOUS | Status: DC
  Administered 2019-05-26 – 2019-05-30 (×5): 1 g via INTRAVENOUS
  Filled 2019-05-26: qty 10
  Filled 2019-05-26 (×2): qty 1
  Filled 2019-05-26 (×2): qty 10

## 2019-05-26 MED ORDER — ADULT MULTIVITAMIN W/MINERALS CH
1.0000 | ORAL_TABLET | Freq: Every day | ORAL | Status: DC
  Administered 2019-05-26 – 2019-05-30 (×3): 1 via ORAL
  Filled 2019-05-26 (×3): qty 1

## 2019-05-26 MED ORDER — LATANOPROST 0.005 % OP SOLN
1.0000 [drp] | Freq: Every day | OPHTHALMIC | Status: DC
  Administered 2019-05-27 – 2019-05-29 (×3): 1 [drp] via OPHTHALMIC
  Filled 2019-05-26 (×3): qty 2.5

## 2019-05-26 MED ORDER — INSULIN ASPART 100 UNIT/ML ~~LOC~~ SOLN
0.0000 [IU] | Freq: Every day | SUBCUTANEOUS | Status: DC
  Administered 2019-05-28 – 2019-05-29 (×2): 2 [IU] via SUBCUTANEOUS

## 2019-05-26 MED ORDER — HYDROCODONE-ACETAMINOPHEN 5-325 MG PO TABS
1.0000 | ORAL_TABLET | ORAL | Status: DC | PRN
  Administered 2019-05-29 – 2019-05-30 (×5): 1 via ORAL
  Filled 2019-05-26 (×5): qty 1

## 2019-05-26 MED ORDER — AMLODIPINE BESYLATE 5 MG PO TABS
5.0000 mg | ORAL_TABLET | Freq: Every day | ORAL | Status: DC
  Administered 2019-05-26 – 2019-05-29 (×5): 5 mg via ORAL
  Filled 2019-05-26 (×5): qty 1

## 2019-05-26 MED ORDER — MAGNESIUM OXIDE 400 (241.3 MG) MG PO TABS
400.0000 mg | ORAL_TABLET | Freq: Every day | ORAL | Status: DC
  Administered 2019-05-26 – 2019-05-30 (×3): 400 mg via ORAL
  Filled 2019-05-26 (×3): qty 1

## 2019-05-26 MED ORDER — ONDANSETRON HCL 4 MG PO TABS: 4.0000 mg | ORAL_TABLET | Freq: Four times a day (QID) | ORAL | Status: DC | PRN

## 2019-05-26 MED ORDER — VANCOMYCIN HCL 10 G IV SOLR
1250.0000 mg | Freq: Two times a day (BID) | INTRAVENOUS | Status: DC
  Administered 2019-05-26 – 2019-05-30 (×8): 1250 mg via INTRAVENOUS
  Filled 2019-05-26 (×12): qty 1250

## 2019-05-26 MED ORDER — ONDANSETRON HCL 4 MG/2ML IJ SOLN: 4.0000 mg | Freq: Four times a day (QID) | INTRAMUSCULAR | Status: DC | PRN
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
## 2019-05-26 NOTE — Progress Notes (Addendum)
Patient ID: [name], male   DOB: 06-28-1946, 73 y.o.   MRN: [id number] Patient was admitted early this morning for left toe infection as per podiatry recommendations and started on IV antibiotics.  Patient seen and examined at bedside and plan of care discussed with him.  I have reviewed patient's medical records including this morning's H&P, current vitals, labs and medications.  Continue IV antibiotics.  I have notified Dr. Sula Rumple who will evaluate the patient today.  He is okay for the patient to be started on a diet.  Repeat a.m. labs.

## 2019-05-26 NOTE — Consult Note (Addendum)
[facility] Nurse wound consult note Reason for Consult: Consult requested for left great toe.  The [facility] team is working remotely today; assessed photos in the EMR and reviewed progress notes. Pt was seen yesterday by Dr Jacqualyn Posey of the podiatry team for a full thickness wound to the left plantar toe, which appears to be red and dry with callous surrounding wound edges.  He has generalized erythremia and cellulitis to the left great toe and plans to be placed on systemic antibiotics, according to progress notes. Dressing procedure/placement/frequency: Topical treatment orders provided for bedside nurses to apply Bactroban daily to provide antimicrobial benefits and promote moist healing, and foam dressing to protect from further injury .  Pt should followup with podiatry for further recommendations. Please re-consult if further assistance is needed.  Thank-you,  Julien Girt MSN, [place], [place], [place], [place]
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
## 2019-05-26 NOTE — Progress Notes (Signed)
Pharmacy Antibiotic Note  [name] is a 73 y.o. male admitted on 05/25/2019 with diabetic foot wound.  Pharmacy has been consulted for Vancomycin dosing. WBC WNL. Renal function good. Failed outpatient doxycycline.   Plan: Vancomycin 1250 mg IV q12h >>Estimated AUC:  Ceftriaxone per MD Trend WBC, temp, renal function  F/U infectious work-up Drug levels as indicated  Temp (24hrs), Avg:98.2 F (36.8 C), Min:97.7 F (36.5 C), Max:98.9 F (37.2 C)  Recent Labs  Lab 05/25/19 1831 05/26/19 0323  WBC 9.9 8.2  CREATININE 0.82 0.72    Estimated Creatinine Clearance: 94.5 mL/min (by C-G formula based on SCr of 0.72 mg/dL).    Allergies  Allergen Reactions  . No Known Allergies    Narda Bonds, PharmD, BCPS Clinical Pharmacist Phone: [phone]

## 2019-05-27 ENCOUNTER — Inpatient Hospital Stay (HOSPITAL_COMMUNITY): Payer: Medicare Other

## 2019-05-27 DIAGNOSIS — I739 Peripheral vascular disease, unspecified: Secondary | ICD-10-CM

## 2019-05-27 DIAGNOSIS — I6523 Occlusion and stenosis of bilateral carotid arteries: Secondary | ICD-10-CM

## 2019-05-27 DIAGNOSIS — I70245 Atherosclerosis of native arteries of left leg with ulceration of other part of foot: Secondary | ICD-10-CM

## 2019-05-27 DIAGNOSIS — M86672 Other chronic osteomyelitis, left ankle and foot: Secondary | ICD-10-CM

## 2019-05-27 LAB — CBC WITH DIFFERENTIAL/PLATELET
Abs Immature Granulocytes: 0.03 10*3/uL (ref 0.00–0.07)
Basophils Absolute: 0.1 10*3/uL (ref 0.0–0.1)
Basophils Relative: 1 %
Eosinophils Absolute: 0.3 10*3/uL (ref 0.0–0.5)
Eosinophils Relative: 4 %
HCT: 41.3 % (ref 39.0–52.0)
Hemoglobin: 13.5 g/dL (ref 13.0–17.0)
Immature Granulocytes: 0 %
Lymphocytes Relative: 38 %
Lymphs Abs: 2.6 10*3/uL (ref 0.7–4.0)
MCH: 30.4 pg (ref 26.0–34.0)
MCHC: 32.7 g/dL (ref 30.0–36.0)
MCV: 93 fL (ref 80.0–100.0)
Monocytes Absolute: 0.7 10*3/uL (ref 0.1–1.0)
Monocytes Relative: 10 %
Neutro Abs: 3.3 10*3/uL (ref 1.7–7.7)
Neutrophils Relative %: 47 %
Platelets: 184 10*3/uL (ref 150–400)
RBC: 4.44 MIL/uL (ref 4.22–5.81)
RDW: 13.5 % (ref 11.5–15.5)
WBC: 7 10*3/uL (ref 4.0–10.5)
nRBC: 0 % (ref 0.0–0.2)

## 2019-05-27 LAB — BASIC METABOLIC PANEL
Anion gap: 8 (ref 5–15)
BUN: 18 mg/dL (ref 8–23)
CO2: 28 mmol/L (ref 22–32)
Calcium: 8.7 mg/dL — ABNORMAL LOW (ref 8.9–10.3)
Chloride: 105 mmol/L (ref 98–111)
Creatinine, Ser: 0.83 mg/dL (ref 0.61–1.24)
GFR calc Af Amer: >60 mL/min (ref >60)
GFR calc non Af Amer: >60 mL/min (ref >60)
Glucose, Bld: 129 mg/dL — ABNORMAL HIGH (ref 70–99)
Potassium: 4 mmol/L (ref 3.5–5.1)
Sodium: 141 mmol/L (ref 135–145)

## 2019-05-27 LAB — GLUCOSE, CAPILLARY
Glucose-Capillary: 145 mg/dL — ABNORMAL HIGH (ref 70–99)
Glucose-Capillary: 153 mg/dL — ABNORMAL HIGH (ref 70–99)
Glucose-Capillary: 160 mg/dL — ABNORMAL HIGH (ref 70–99)
Glucose-Capillary: 120 mg/dL — ABNORMAL HIGH (ref 70–99)

## 2019-05-27 LAB — MAGNESIUM: Magnesium: 2.2 mg/dL (ref 1.7–2.4)
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
## 2019-05-27 NOTE — Progress Notes (Signed)
PROGRESS NOTE  [name] AYT:[id number] DOB: 02-01-1946 DOA: 05/25/2019 PCP: [name], MD  Brief History    [name] is a 73 y.o. year old male with medical history significant for hypertension, hyperlipidemia, diabetes mellitus, COPD, stroke, GERD, gout, depression, anxiety, PVD, back pain, cerebral aneurysm without rupture, AAA who presented on 05/25/2019 with longstanding left greater toe ulcer with worsening redness and drainage despite initial course of doxycycline and was found to have left greater toe cellulitis/osteomyelitis.  A & P  Left hallux ulceration with osteomyelitis.  MRI confirms OM.  We will continue empiric vancomycin and ceftriaxone while monitoring cultures.  Appreciate recommendations from podiatry, Dr. Jacqualyn Posey, will defer amputation until vascular assessment given moderate disease in left lower extremity on arterial studies  PAD.  Serial studies show moderate disease in left lower extremity this admission.  Vascular consulted, will await recommendations for potential amputation per podiatry  Hypertension, stable.  Continue home amlodipine, Coreg, losartan, clonidine listed as medication will reconcile with patient  Type 2 diabetes with diabetic neuropathy,  Controlled (A1c 5.9%, 04/24/19).  CBGs at goal here, continue sliding scale as needed.  Holding home oral hypoglycemics.,  Continue Lyrica  History of CVA. Continue aspirin and lipitor  Depression with anxiety. Continue wellbutrin    DVT prophylaxis: CDs Code Status: Full code Family Communication: No family at bedside Disposition Plan: Vascular surgery consulted given moderate disease in the lower extremity concerning for PAD, pending assessment will determine time for amputation by podiatry, continue IV antibiotics.    [facility] Direct contact: see [email or web address] (further directions at bottom of note if needed) 7PM-7AM contact night coverage as at bottom of note 05/27/2019, 12:54 PM  LOS:  1 day   Consultants  . Podiatry, vascular  Procedures  . None  Antibiotics  . Vancomycin-7/21 . Ceftriaxone 7/21  Interval History/Subjective  Reports improvement in the redness in his foot since being in the hospital.  Reports pain well controlled.  Denies any current drainage.  Objective   Vitals:  Vitals:   05/27/19 0413 05/27/19 0756  BP: (!) 147/68 131/78  Pulse: 71 73  Resp: 16 17  Temp: 97.9 F (36.6 C) 98 F (36.7 C)  SpO2: 93% 93%    Exam:  Awake Alert, Oriented X 3, No new F.N deficits, Normal affect Symmetrical Chest wall movement, Good air movement bilaterally, CTAB RRR,No Gallops,Rubs or new Murmurs, No Parasternal Heave +ve B.Sounds, Abd Soft, No tenderness, No organomegaly appriciated, No rebound - guarding or rigidity. Left greater toe with dressing in place, some mild dry drainage with no obvious surrounding erythema on foot   I have personally reviewed the following:   Today's Data    Lab Data   Recent Labs  Lab 05/25/19 1831 05/26/19 0323 05/27/19 0102  NA 140 139 141  K 3.9 4.1 4.0  CL 103 103 105  CO2 26 29 28   GLUCOSE 84 177* 129*  BUN 20 17 18   CREATININE 0.82 0.72 0.83  CALCIUM 9.3 8.9 8.7*  MG  --   --  2.2  .  . Liver Function Tests: . No results for input(s): AST, ALT, ALKPHOS, BILITOT, PROT, ALBUMIN in the last 168 hours. . No results for input(s): LIPASE, AMYLASE in the last 168 hours. . No results for input(s): AMMONIA in the last 168 hours. . CBC: Recent Labs  Lab 05/25/19 1831 05/26/19 0323 05/27/19 0102  WBC 9.9 8.2 7.0  NEUTROABS 6.2  --  3.3  HGB 14.2 13.7  13.5  HCT 44.2 42.5 41.3  MCV 94.0 93.0 93.0  PLT 208 186 184  .  . CBG: Recent Labs  Lab 05/26/19 1134 05/26/19 1729 05/26/19 2142 05/27/19 0734 05/27/19 1151  GLUCAP 114* 114* 136* 145* 153*  .   Micro Data  . Blood cultures pending, 7/20  Imaging  MRI left foot with and without contrast: Osteomyelitis of the distal phalanx of the great  toe. 2. Edema and enhancement of the distal aspect of the proximal . phalanx of the great toe is likely reactive.     Cardiology Data  . No results for input(s): CKTOTAL, CKMB, CKMBINDEX, TROPONINI in the last 168 hours.  Other Data  .   Scheduled Meds: . amLODipine  5 mg Oral QHS  . aspirin EC  81 mg Oral QHS  . atorvastatin  80 mg Oral q1800  . carvedilol  12.5 mg Oral BID WC  . cholecalciferol  1,000 Units Oral Daily  . insulin aspart  0-5 Units Subcutaneous QHS  . insulin aspart  0-9 Units Subcutaneous TID WC  . latanoprost  1 drop Both Eyes QHS  . losartan  50 mg Oral Daily  . magnesium oxide  400 mg Oral Daily  . multivitamin with minerals  1 tablet Oral Daily  . mupirocin cream   Topical Daily  . omega-3 acid ethyl esters  1 g Oral Daily  . pregabalin  150 mg Oral TID  . [START ON 05/28/2019] vitamin B-12  1,000 mcg Oral Once per day on Mon Thu  . vitamin C  1,000 mg Oral Daily   Continuous Infusions: . cefTRIAXone (ROCEPHIN)  IV 1 g (05/27/19 0425)  . vancomycin 1,250 mg (05/27/19 9826)    Principal Problem:   Left foot infection Active Problems:   Diabetes mellitus type 2 with neurological manifestations ([place])   Hyperlipidemia   Essential hypertension   History of CVA (cerebrovascular accident) without residual deficits   COPD (chronic obstructive pulmonary disease) ([place])   Depression with anxiety   Cellulitis of left foot   Diabetic ulcer of left great toe ([place])   LOS: 1 day   [name]  [facility] How to contact the [facility] Attending or Consulting provider [place] or covering provider during after hours [place], for this patient?  1. Check the care team in [facility] and look for a) attending/consulting [facility] provider listed and b) the [facility] team listed 2. Log into [email or web address] and use [place]'s universal password to access. If you do not have the password, please contact the hospital operator. 3. Locate the [facility] provider you are looking for under  [facility] and page to a number that you can be directly reached. 4. If you still have difficulty reaching the provider, please page the [facility] (Director on Call) for the Hospitalists listed on amion for assistance.

## 2019-05-27 NOTE — Progress Notes (Signed)
[name] has been completed.   Preliminary results in CV Proc.   [name] 05/27/2019 9:51 AM

## 2019-05-27 NOTE — Progress Notes (Addendum)
Subjective: 73 year old male managed for left foot infection. He was orginally seen by Dr. Elisha Ponder for a callus and found to have a wound. I have been seeing him for the wound as well.  Had MRI performed last night which revealed osteomyelitis.  Arterial studies performed today which revealed moderate disease in the left lower extremity. Arterial studies ordered as an outpatient but not done.  Overall he states he is feeling well he has no fevers, chills, nausea, vomiting.  No calf pain, chest pain, shortness of breath.  No pain to the foot.   Objective: AAO x3, NAD- laying in bed; wife on speakerphone  DP, PT pulses decreased. Ulceration still present on the plantar aspect left hallux IPJ with a granular base.  There is still localized edema and erythema to the hallux.  The swelling to the left foot appears to be improved and minimal warmth of the foot. No open lesions or pre-ulcerative lesions.  No pain with calf compression, swelling, warmth, erythema  Assessment: Left hallux ulceration with osteomyelitis; PAD  Plan: -Reviewed the MRI as well as arterial studies with him and his wife who was on speaker phone today.  My recommendation is to amputation.  Prior to this though I would like to recommend a vascular consult given moderate reduction on his left lower extremity.  For now continue antibiotics.  Elevation.  We will continue to follow. Plan for toe amputation Friday afternoon most likely.   Celesta Gentile, DPM O: [phone] C: [phone]

## 2019-05-28 ENCOUNTER — Ambulatory Visit: Payer: Medicare Other | Admitting: *Deleted

## 2019-05-28 ENCOUNTER — Encounter (HOSPITAL_COMMUNITY)
Admission: EM | Disposition: A | Discharge status: Home / Self Care | Payer: Self-pay | Source: Home / Self Care | Attending: Internal Medicine

## 2019-05-28 DIAGNOSIS — M869 Osteomyelitis, unspecified: Secondary | ICD-10-CM

## 2019-05-28 DIAGNOSIS — M86672 Other chronic osteomyelitis, left ankle and foot: Secondary | ICD-10-CM

## 2019-05-28 HISTORY — PX: ABDOMINAL AORTOGRAM W/LOWER EXTREMITY: CATH118223

## 2019-05-28 HISTORY — PX: PERIPHERAL VASCULAR INTERVENTION: CATH118257

## 2019-05-28 LAB — GLUCOSE, CAPILLARY
Glucose-Capillary: 133 mg/dL — ABNORMAL HIGH (ref 70–99)
Glucose-Capillary: 106 mg/dL — ABNORMAL HIGH (ref 70–99)
Glucose-Capillary: 85 mg/dL (ref 70–99)
Glucose-Capillary: 235 mg/dL — ABNORMAL HIGH (ref 70–99)

## 2019-05-28 LAB — CBC WITH DIFFERENTIAL/PLATELET
Abs Immature Granulocytes: 0.05 10*3/uL (ref 0.00–0.07)
Basophils Absolute: 0 10*3/uL (ref 0.0–0.1)
Basophils Relative: 0 %
Eosinophils Absolute: 0.1 10*3/uL (ref 0.0–0.5)
Eosinophils Relative: 1 %
HCT: 38.9 % — ABNORMAL LOW (ref 39.0–52.0)
Hemoglobin: 12.7 g/dL — ABNORMAL LOW (ref 13.0–17.0)
Immature Granulocytes: 1 %
Lymphocytes Relative: 9 %
Lymphs Abs: 1 10*3/uL (ref 0.7–4.0)
MCH: 30.4 pg (ref 26.0–34.0)
MCHC: 32.6 g/dL (ref 30.0–36.0)
MCV: 93.1 fL (ref 80.0–100.0)
Monocytes Absolute: 0.5 10*3/uL (ref 0.1–1.0)
Monocytes Relative: 5 %
Neutro Abs: 9.1 10*3/uL — ABNORMAL HIGH (ref 1.7–7.7)
Neutrophils Relative %: 84 %
Platelets: 190 10*3/uL (ref 150–400)
RBC: 4.18 MIL/uL — ABNORMAL LOW (ref 4.22–5.81)
RDW: 13.3 % (ref 11.5–15.5)
WBC: 10.8 10*3/uL — ABNORMAL HIGH (ref 4.0–10.5)
nRBC: 0 % (ref 0.0–0.2)

## 2019-05-28 LAB — BASIC METABOLIC PANEL
Anion gap: 10 (ref 5–15)
BUN: 18 mg/dL (ref 8–23)
CO2: 29 mmol/L (ref 22–32)
Calcium: 8.6 mg/dL — ABNORMAL LOW (ref 8.9–10.3)
Chloride: 102 mmol/L (ref 98–111)
Creatinine, Ser: 0.86 mg/dL (ref 0.61–1.24)
GFR calc Af Amer: >60 mL/min (ref >60)
GFR calc non Af Amer: >60 mL/min (ref >60)
Glucose, Bld: 136 mg/dL — ABNORMAL HIGH (ref 70–99)
Potassium: 4.1 mmol/L (ref 3.5–5.1)
Sodium: 141 mmol/L (ref 135–145)

## 2019-05-28 LAB — CBC
HCT: 41.4 % (ref 39.0–52.0)
Hemoglobin: 13.3 g/dL (ref 13.0–17.0)
MCH: 30.4 pg (ref 26.0–34.0)
MCHC: 32.1 g/dL (ref 30.0–36.0)
MCV: 94.5 fL (ref 80.0–100.0)
Platelets: 186 10*3/uL (ref 150–400)
RBC: 4.38 MIL/uL (ref 4.22–5.81)
RDW: 13.2 % (ref 11.5–15.5)
WBC: 7.2 10*3/uL (ref 4.0–10.5)
nRBC: 0 % (ref 0.0–0.2)

## 2019-05-28 LAB — POCT ACTIVATED CLOTTING TIME
Activated Clotting Time: 230 seconds
Activated Clotting Time: 180 seconds

## 2019-05-28 SURGERY — ABDOMINAL AORTOGRAM W/LOWER EXTREMITY
Anesthesia: LOCAL | Laterality: Bilateral

## 2019-05-28 MED ORDER — SODIUM CHLORIDE 0.9 % IV SOLN
INTRAVENOUS | Status: DC
  Administered 2019-05-28 – 2019-05-30 (×2): via INTRAVENOUS

## 2019-05-28 MED ORDER — SODIUM CHLORIDE 0.9 % IV SOLN
INTRAVENOUS | Status: AC
  Administered 2019-05-28: 17:00:00 via INTRAVENOUS

## 2019-05-28 MED ORDER — HEPARIN (PORCINE) IN NACL 1000-0.9 UT/500ML-% IV SOLN
INTRAVENOUS | Status: AC
  Filled 2019-05-28: qty 500

## 2019-05-28 MED ORDER — MIDAZOLAM HCL 2 MG/2ML IJ SOLN
INTRAMUSCULAR | Status: AC
  Filled 2019-05-28: qty 2

## 2019-05-28 MED ORDER — LIDOCAINE HCL (PF) 1 % IJ SOLN
INTRAMUSCULAR | Status: DC | PRN
  Administered 2019-05-28: 12 mL
  Administered 2019-05-28: 13 mL

## 2019-05-28 MED ORDER — ACETAMINOPHEN 325 MG PO TABS
650.0000 mg | ORAL_TABLET | ORAL | Status: DC | PRN
  Administered 2019-05-30: 650 mg via ORAL
  Filled 2019-05-28: qty 2

## 2019-05-28 MED ORDER — SODIUM CHLORIDE 0.9% FLUSH: 3.0000 mL | INTRAVENOUS | Status: DC | PRN

## 2019-05-28 MED ORDER — FENTANYL CITRATE (PF) 100 MCG/2ML IJ SOLN
INTRAMUSCULAR | Status: AC
  Filled 2019-05-28: qty 2

## 2019-05-28 MED ORDER — LABETALOL HCL 5 MG/ML IV SOLN: 10.0000 mg | INTRAVENOUS | Status: DC | PRN

## 2019-05-28 MED ORDER — CLOPIDOGREL BISULFATE 75 MG PO TABS
300.0000 mg | ORAL_TABLET | Freq: Once | ORAL | Status: AC
  Administered 2019-05-28: 300 mg via ORAL
  Filled 2019-05-28: qty 4

## 2019-05-28 MED ORDER — CLOPIDOGREL BISULFATE 75 MG PO TABS
75.0000 mg | ORAL_TABLET | Freq: Every day | ORAL | Status: DC
  Administered 2019-05-29 – 2019-05-30 (×2): 75 mg via ORAL
  Filled 2019-05-28 (×2): qty 1

## 2019-05-28 MED ORDER — HEPARIN (PORCINE) IN NACL 1000-0.9 UT/500ML-% IV SOLN
INTRAVENOUS | Status: DC | PRN
  Administered 2019-05-28 (×2): 500 mL

## 2019-05-28 MED ORDER — HEPARIN SODIUM (PORCINE) 1000 UNIT/ML IJ SOLN
INTRAMUSCULAR | Status: DC | PRN
  Administered 2019-05-28: 10000 [IU] via INTRAVENOUS

## 2019-05-28 MED ORDER — BUPROPION HCL ER (SR) 150 MG PO TB12
150.0000 mg | ORAL_TABLET | Freq: Every day | ORAL | Status: DC
  Administered 2019-05-28 – 2019-05-30 (×3): 150 mg via ORAL
  Filled 2019-05-28 (×3): qty 1

## 2019-05-28 MED ORDER — LIDOCAINE HCL (PF) 1 % IJ SOLN
INTRAMUSCULAR | Status: AC
  Filled 2019-05-28: qty 30

## 2019-05-28 MED ORDER — CLOPIDOGREL BISULFATE 300 MG PO TABS
ORAL_TABLET | ORAL | Status: DC | PRN
  Administered 2019-05-28: 300 mg via ORAL

## 2019-05-28 MED ORDER — SODIUM CHLORIDE 0.9 % IV SOLN: 250.0000 mL | INTRAVENOUS | Status: DC | PRN

## 2019-05-28 MED ORDER — HYDRALAZINE HCL 20 MG/ML IJ SOLN: 5.0000 mg | INTRAMUSCULAR | Status: DC | PRN

## 2019-05-28 MED ORDER — ONDANSETRON HCL 4 MG/2ML IJ SOLN: 4.0000 mg | Freq: Four times a day (QID) | INTRAMUSCULAR | Status: DC | PRN

## 2019-05-28 MED ORDER — MIDAZOLAM HCL 2 MG/2ML IJ SOLN
INTRAMUSCULAR | Status: DC | PRN
  Administered 2019-05-28 (×2): 1 mg via INTRAVENOUS

## 2019-05-28 MED ORDER — IODIXANOL 320 MG/ML IV SOLN
INTRAVENOUS | Status: DC | PRN
  Administered 2019-05-28: 215 mL via INTRA_ARTERIAL

## 2019-05-28 MED ORDER — SODIUM CHLORIDE 0.9% FLUSH
3.0000 mL | Freq: Two times a day (BID) | INTRAVENOUS | Status: DC
  Administered 2019-05-30: 3 mL via INTRAVENOUS

## 2019-05-28 MED ORDER — CLOPIDOGREL BISULFATE 300 MG PO TABS
ORAL_TABLET | ORAL | Status: AC
  Filled 2019-05-28: qty 1

## 2019-05-28 MED ORDER — HEPARIN SODIUM (PORCINE) 1000 UNIT/ML IJ SOLN
INTRAMUSCULAR | Status: AC
  Filled 2019-05-28: qty 1

## 2019-05-28 MED ORDER — FENTANYL CITRATE (PF) 100 MCG/2ML IJ SOLN
INTRAMUSCULAR | Status: DC | PRN
  Administered 2019-05-28 (×2): 25 ug via INTRAVENOUS

## 2019-05-28 SURGICAL SUPPLY — 18 items
CATH BEACON 5 .035 65 KMP TIP (CATHETERS) ×1 IMPLANT
CATH OMNI FLUSH 5F 65CM (CATHETERS) ×1 IMPLANT
DEVICE TORQUE .025-.038 (MISCELLANEOUS) ×1 IMPLANT
GUIDEWIRE ANGLED .035X150CM (WIRE) ×1 IMPLANT
KIT ENCORE 26 ADVANTAGE (KITS) ×2 IMPLANT
KIT MICROPUNCTURE NIT STIFF (SHEATH) ×1 IMPLANT
KIT PV (KITS) ×3 IMPLANT
SHEATH BRITE TIP 7FR 35CM (SHEATH) ×2 IMPLANT
SHEATH PINNACLE 5F 10CM (SHEATH) ×2 IMPLANT
SHEATH PROBE COVER 6X72 (BAG) ×1 IMPLANT
STENT VIABAHN 8X29X80 VBX (Permanent Stent) ×1 IMPLANT
STENT VIABAHN 8X39X80 VBX (Permanent Stent) ×1 IMPLANT
SYR MEDRAD MARK 7 150ML (SYRINGE) ×3 IMPLANT
TRANSDUCER W/STOPCOCK (MISCELLANEOUS) ×3 IMPLANT
TRAY PV CATH (CUSTOM PROCEDURE TRAY) ×3 IMPLANT
WIRE AMPLATZ SS-J .035X180CM (WIRE) ×2 IMPLANT
WIRE BENTSON .035X145CM (WIRE) ×1 IMPLANT
WIRE TORQFLEX AUST .018X40CM (WIRE) ×2 IMPLANT

## 2019-05-28 NOTE — Progress Notes (Signed)
Pt wanted to know if his wife could be in the wating area while his procedure was going on. RN called Leanne from PACU and explained the situation and she stated that it was okay for Patients wife to wait in waiting area while pt in procedure but she could not come back.

## 2019-05-28 NOTE — Progress Notes (Signed)
[facility]  Subjective  - No complaints.   Objective (!) 147/70 72 97.7 F (36.5 C) (Oral) 19 95%  Intake/Output Summary (Last 24 hours) at 05/28/2019 1525 Last data filed at 05/28/2019 0700 Gross per 24 hour  Intake -  Output 1350 ml  Net -1350 ml    No left femoral pulse Right femoral pulse  Laboratory Lab Results: Recent Labs    05/27/19 0102 05/28/19 0347  WBC 7.0 7.2  HGB 13.5 13.3  HCT 41.3 41.4  PLT 184 186   BMET Recent Labs    05/27/19 0102 05/28/19 0347  NA 141 141  K 4.0 4.1  CL 105 102  CO2 28 29  GLUCOSE 129* 136*  BUN 18 18  CREATININE 0.83 0.86  CALCIUM 8.7* 8.6*    COAG Lab Results  Component Value Date   INR 1.1 05/26/2019   No results found for: PTT  Assessment/Planning:  Plan for aortogram and possible iliac intervention. No left femoral pulse.  Hx of left common iliac lesion on old CT.  Seen by Dr. Donnetta Hutching in consultation yesterday.  Marty Heck 05/28/2019 3:25 PM --

## 2019-05-28 NOTE — Progress Notes (Signed)
Site area: rt and left groins fa sheaths. Left fa sheath pulled and pressure held by Carlean Jews Site Prior to Removal:  Level 0 Pressure Applied For:  25 minutes each side Manual:   yes Patient Status During Pull:  stable Post Pull Site:  Level  0 Post Pull Instructions Given:  yes Post Pull Pulses Present: rt and left dp palpable Dressing Applied:  Gauze and tegaderm Bedrest begins @ 1835 Comments:

## 2019-05-28 NOTE — Progress Notes (Signed)
PROGRESS NOTE  [name] WPY:[id number] DOB: [birth year] DOA: 05/25/2019 PCP: [name], MD  Brief History    [name] is a 73 y.o. year old male with medical history significant for hypertension, hyperlipidemia, diabetes mellitus, COPD, stroke, GERD, gout, depression, anxiety, PVD, back pain, cerebral aneurysm without rupture, AAA who presented on 05/25/2019 with longstanding left greater toe ulcer with worsening redness and drainage despite initial course of doxycycline and was found to have left greater toe cellulitis/osteomyelitis.  A & P  Left hallux ulceration with osteomyelitis.  MRI confirms OM.  We will continue empiric vancomycin and ceftriaxone while monitoring cultures.  Appreciate recommendations from podiatry, Dr. Jacqualyn Posey, will defer amputation until vascular assessment given moderate disease in left lower extremity on arterial studies  PAD.  Serial studies show moderate disease in left lower extremity this admission.  Vascular consulted, plan for arteriogram today, 7/23  Hypertension, stable.  Continue home amlodipine, Coreg, losartan, clonidine listed as medication will reconcile with patient  Type 2 diabetes with diabetic neuropathy,  Controlled (A1c 5.9%, 04/24/19).  CBGs at goal here, continue sliding scale as needed.  Holding home oral hypoglycemics.,  Continue Lyrica  History of CVA. Continue aspirin and lipitor  Depression with anxiety. Continue wellbutrin    DVT prophylaxis: CDs Code Status: Full code Family Communication: No family at bedside Disposition Plan: Vascular surgery plans for arteriogram for further evaluation of left lower extremity show moderate disease on arterial studies, pending assessment will determine time for amputation by podiatry, continue IV antibiotics.    [facility] Direct contact: see [email or web address] (further directions at bottom of note if needed) 7PM-7AM contact night coverage as at bottom of note 05/28/2019, 4:05 PM   LOS: 2 days   Consultants  . Podiatry, vascular  Procedures  . None  Antibiotics  . Vancomycin-7/21 . Ceftriaxone 7/21  Interval History/Subjective  No acute complaints.  Objective   Vitals:  Vitals:   05/28/19 0819 05/28/19 1536  BP: (!) 147/70   Pulse: 72   Resp: 19   Temp: 97.7 F (36.5 C)   SpO2: 95% 98%    Exam:  Awake Alert, Oriented X 3, No new F.N deficits, Normal affect Symmetrical Chest wall movement, Good air movement bilaterally, CTAB RRR,No Gallops,Rubs or new Murmurs, No Parasternal Heave +ve B.Sounds, Abd Soft, No tenderness, No organomegaly appriciated, No rebound - guarding or rigidity. Left greater toe with dressing in place, some mild dry drainage with no obvious surrounding erythema on foot   I have personally reviewed the following:   Today's Data    Lab Data   Recent Labs  Lab 05/25/19 1831 05/26/19 0323 05/27/19 0102 05/28/19 0347  NA 140 139 141 141  K 3.9 4.1 4.0 4.1  CL 103 103 105 102  CO2 26 29 28 29   GLUCOSE 84 177* 129* 136*  BUN 20 17 18 18   CREATININE 0.82 0.72 0.83 0.86  CALCIUM 9.3 8.9 8.7* 8.6*  MG  --   --  2.2  --    . Liver Function Tests: No results for input(s): AST, ALT, ALKPHOS, BILITOT, PROT, ALBUMIN in the last 168 hours. No results for input(s): LIPASE, AMYLASE in the last 168 hours. No results for input(s): AMMONIA in the last 168 hours. . CBC: Recent Labs  Lab 05/25/19 1831 05/26/19 0323 05/27/19 0102 05/28/19 0347  WBC 9.9 8.2 7.0 7.2  NEUTROABS 6.2  --  3.3  --   HGB 14.2 13.7 13.5 13.3  HCT 44.2 42.5  41.3 41.4  MCV 94.0 93.0 93.0 94.5  PLT 208 186 184 186   . CBG: Recent Labs  Lab 05/27/19 1151 05/27/19 1706 05/27/19 2059 05/28/19 0756 05/28/19 1132  GLUCAP 153* 160* 120* 133* 106*    Micro Data  . Blood cultures pending, 7/20  Imaging  MRI left foot with and without contrast: Osteomyelitis of the distal phalanx of the great toe. 2. Edema and enhancement of the distal aspect  of the proximal . phalanx of the great toe is likely reactive.     Cardiology Data  No results for input(s): CKTOTAL, CKMB, CKMBINDEX, TROPONINI in the last 168 hours.  Other Data  .   Scheduled Meds: . [MAR Hold] amLODipine  5 mg Oral QHS  . [MAR Hold] aspirin EC  81 mg Oral QHS  . [MAR Hold] atorvastatin  80 mg Oral q1800  . [MAR Hold] carvedilol  12.5 mg Oral BID WC  . [MAR Hold] cholecalciferol  1,000 Units Oral Daily  . [MAR Hold] insulin aspart  0-5 Units Subcutaneous QHS  . [MAR Hold] insulin aspart  0-9 Units Subcutaneous TID WC  . [MAR Hold] latanoprost  1 drop Both Eyes QHS  . [MAR Hold] losartan  50 mg Oral Daily  . [MAR Hold] magnesium oxide  400 mg Oral Daily  . [MAR Hold] multivitamin with minerals  1 tablet Oral Daily  . [MAR Hold] mupirocin cream   Topical Daily  . [MAR Hold] omega-3 acid ethyl esters  1 g Oral Daily  . [MAR Hold] pregabalin  150 mg Oral TID  . [MAR Hold] vitamin B-12  1,000 mcg Oral Once per day on Mon Thu  . [MAR Hold] vitamin C  1,000 mg Oral Daily   Continuous Infusions: . sodium chloride 100 mL/hr at 05/28/19 1025  . [MAR Hold] cefTRIAXone (ROCEPHIN)  IV 1 g (05/28/19 0220)  . [MAR Hold] vancomycin 1,250 mg (05/28/19 0701)    Principal Problem:   Left hallux osteomyelitis ([place]) Active Problems:   Diabetes mellitus type 2 with neurological manifestations ([place])   Hyperlipidemia   Essential hypertension   History of CVA (cerebrovascular accident) without residual deficits   COPD (chronic obstructive pulmonary disease) ([place])   Depression with anxiety   Cellulitis of left foot   Diabetic ulcer of left great toe ([place])   LOS: 2 days   [name]  [facility] How to contact the [facility] Attending or Consulting provider [place] or covering provider during after hours [place], for this patient?  1. Check the care team in [facility] and look for a) attending/consulting [facility] provider listed and b) the [facility] team listed 2. Log into  [email or web address] and use [place] universal password to access. If you do not have the password, please contact the hospital operator. 3. Locate the [facility] provider you are looking for under [facility] and page to a number that you can be directly reached. 4. If you still have difficulty reaching the provider, please page the [facility] (Director on Call) for the Hospitalists listed on amion for assistance.

## 2019-05-28 NOTE — Significant Event (Signed)
Rapid Response Event Note  Overview:  Called for pt with R femoral hematoma after coming from cath lab. RN trying to get in touch with cath lab team to come assess while pressure was being held.    Initial Focused Assessment: On arrival, pt laying flat in bed with MD at bedside holding pressure to R groin. Pt RLE cool to touch with weak petal pulse initially. LLE warm to touch with palpable pedal pulse. HR 78, BP 90/61, RR 17, spO2 91% on RA.   Interventions: 2L [place] placed on pt with spO2 improving to 97% Pressure held for greater than 20 mins by Md pressure dressing placed CBC ordered  Plan of Care (if not transferred): Continue to monitor groin sites for increased bleeding/hematoma. Frequent site and vascular checks. Keep pt flat for the night. Pt educated on importance of staying flat and not bending legs to prevent re-bleeding.  RLE temperature improved from initial assessemt, R pedal pulse strong, BP improved to 109/80. Will continue to monitor pt. RN instructed to call with any changes or concerns.  Event Summary:  called at  1927   event ended at  [address]

## 2019-05-28 NOTE — Op Note (Signed)
Patient name: [name] MRN: [id number] DOB: 06-06-1946 Sex: male  05/28/2019 Pre-operative Diagnosis: Critical limb ischemia of left lower extremity with tissue loss and nonhealing wound Post-operative diagnosis:  Same Surgeon:  Marty Heck, MD Procedure Performed: 1.  Ultrasound-guided access of the right common femoral artery 2.  Aortogram with bilateral lower extremity arteriogram 3.  Ultrasound-guided access of the left common femoral artery  4.  Bilateral kissing common iliac artery stents (8 mm x 29 mm right VBX and 8 mm x 39 mm left VBX stents) 5.  68 minutes of monitored moderate conscious sedation time  Indications: Patient is a 73 year old male who was seen in consultation by Dr. early yesterday for a nonhealing wound on his left foot.  He had previous CT evidence of a high-grade left common iliac stenosis.  On noninvasive imaging he had monophasic runoff of the foot.  He presents today for aortogram with possible left iliac intervention after risk and benefits were discussed.  Of note this is in the setting of a known 3.9 cm infrarenal abdominal aortic aneurysm.  Findings:   Aortogram showed that the left common iliac artery was completely occluded.  There was no evidence of any stenosis in the right common and external iliac artery.  It appeared that the left common femoral artery reconstituted through collaterals with retrograde filling of the left external iliac and a distal common stump with a patent hypogastric.  Bilateral lower extremity arteriogram showed bilateral patent common femoral profunda SFA popliteal and three-vessel runoff.  All of his arteries were heavily calcified with disease throughout their segments but nothing that appeared flow-limiting infrainguinal.  Ultimately after ultrasound-guided access of bilateral common femoral arteries, I was able to cross the left common iliac occlusion retrograde into the aneurysm.  Kissing VBX stents were placed  in bilateral common iliac arteries.  Patient now has inline flow down the left common iliac occlusion with no evidence of residual stenosis.  He had a palpable dorsalis pedis pulse in his left foot.   Procedure:  The patient was identified in the holding area and taken to room 8.  The patient was then placed supine on the table and prepped and draped in the usual sterile fashion.  A time out was called.  Ultrasound was used to evaluate the right common femoral artery.  It was patent .  A digital ultrasound image was acquired.  A micropuncture needle was used to access the right common femoral artery under ultrasound guidance.  An 018 wire was advanced without resistance and a micropuncture sheath was placed.  The 018 wire was removed and a benson wire was placed.  The micropuncture sheath was exchanged for a 5 french sheath.  An omniflush catheter was advanced over the wire to the level of L-1.  An abdominal angiogram was obtained with pertinent findings noted above.  Also performed bilateral lower extremity runoff as well as dedicated left iliac arteriogram.  Ultimately it appeared that his previously left high-grade common iliac stenosis is now occluded.  I made the decision to try and cross this retrograde and intervene.  At that point time I evaluate the left common femoral artery with ultrasound, it was patent and image was saved.  I then accessed the artery with micro access needle in the left common femoral.  I placed a microwire and then threaded a micro-sheath.  I then placed a Bentson wire exchanged for a short 5 [place] sheath.  That point time I then crossed  the left common iliac occlusion retrograde with a KMP and softening a Glidewire through a chronic total occlusion and got into the infrarenal aorta and in the aneurysm sac.  At that point in time I then exchanged for an Amplatz wire with a KMP catheter.  A long 7 French bright tip sheath was then placed in the left groin and advanced across the  occlusion.  The patient was given 100 units/kg heparin.  I then did the same on the right exchanging with a KMP for Amplatz wire and placed a long 7 French bright tip sheath on the right.  Patient will need likely kissing iliac stents given it was very difficult to determine where the aortic bifurcation was located given his aneurysm and flush occlusion on the left.  At that point in time we got some planning shots with Omni Flush catheter up the right side marked the iliac occlusion as well as the aortic bifurcation hypogastric artery.  Over Amplatz we then elected to deploy a 8 mm by 29 mm VBX on the right and a 8 mm x 39 mm VBX on the left.  These were deployed to nominal pressure simultaneously kissing the stents at the bifurcation.  We put a catheter back up the right side and shot one final aortogram that showed now inline flow down both iliacs with no evidence of residual stenosis and widely patent stents bilaterally.  He will be taken to recovery for having his sheath removed given that he had a lot of calcific disease in both common femoral arteries noted I think that percutaneous closure device would be safe.    Marty Heck, MD [facility] [place] Office: [phone] Pager: [phone]

## 2019-05-29 ENCOUNTER — Encounter (HOSPITAL_COMMUNITY)
Admission: EM | Disposition: A | Discharge status: Home / Self Care | Payer: Self-pay | Source: Home / Self Care | Attending: Internal Medicine

## 2019-05-29 ENCOUNTER — Inpatient Hospital Stay (HOSPITAL_COMMUNITY): Payer: Medicare Other | Admitting: Anesthesiology

## 2019-05-29 ENCOUNTER — Encounter (HOSPITAL_COMMUNITY): Payer: Self-pay | Admitting: Vascular Surgery

## 2019-05-29 DIAGNOSIS — F418 Other specified anxiety disorders: Secondary | ICD-10-CM

## 2019-05-29 DIAGNOSIS — I998 Other disorder of circulatory system: Secondary | ICD-10-CM

## 2019-05-29 DIAGNOSIS — M86672 Other chronic osteomyelitis, left ankle and foot: Secondary | ICD-10-CM

## 2019-05-29 DIAGNOSIS — I70229 Atherosclerosis of native arteries of extremities with rest pain, unspecified extremity: Secondary | ICD-10-CM | POA: Diagnosis present

## 2019-05-29 HISTORY — PX: AMPUTATION: SHX166

## 2019-05-29 LAB — BASIC METABOLIC PANEL
Anion gap: 10 (ref 5–15)
BUN: 16 mg/dL (ref 8–23)
CO2: 28 mmol/L (ref 22–32)
Calcium: 8.6 mg/dL — ABNORMAL LOW (ref 8.9–10.3)
Chloride: 103 mmol/L (ref 98–111)
Creatinine, Ser: 0.74 mg/dL (ref 0.61–1.24)
GFR calc Af Amer: >60 mL/min (ref >60)
GFR calc non Af Amer: >60 mL/min (ref >60)
Glucose, Bld: 130 mg/dL — ABNORMAL HIGH (ref 70–99)
Potassium: 4 mmol/L (ref 3.5–5.1)
Sodium: 141 mmol/L (ref 135–145)

## 2019-05-29 LAB — CBC
HCT: 39.6 % (ref 39.0–52.0)
Hemoglobin: 12.6 g/dL — ABNORMAL LOW (ref 13.0–17.0)
MCH: 30.4 pg (ref 26.0–34.0)
MCHC: 31.8 g/dL (ref 30.0–36.0)
MCV: 95.7 fL (ref 80.0–100.0)
Platelets: 214 10*3/uL (ref 150–400)
RBC: 4.14 MIL/uL — ABNORMAL LOW (ref 4.22–5.81)
RDW: 13.3 % (ref 11.5–15.5)
WBC: 8.2 10*3/uL (ref 4.0–10.5)
nRBC: 0 % (ref 0.0–0.2)

## 2019-05-29 LAB — GLUCOSE, CAPILLARY
Glucose-Capillary: 101 mg/dL — ABNORMAL HIGH (ref 70–99)
Glucose-Capillary: 98 mg/dL (ref 70–99)
Glucose-Capillary: 101 mg/dL — ABNORMAL HIGH (ref 70–99)
Glucose-Capillary: 113 mg/dL — ABNORMAL HIGH (ref 70–99)
Glucose-Capillary: 161 mg/dL — ABNORMAL HIGH (ref 70–99)
Glucose-Capillary: 228 mg/dL — ABNORMAL HIGH (ref 70–99)

## 2019-05-29 SURGERY — AMPUTATION, TOE
Anesthesia: Choice | Laterality: Left

## 2019-05-29 SURGERY — AMPUTATION DIGIT
Anesthesia: Monitor Anesthesia Care | Site: Toe | Laterality: Left

## 2019-05-29 MED ORDER — FENTANYL CITRATE (PF) 100 MCG/2ML IJ SOLN
50.0000 ug | Freq: Once | INTRAMUSCULAR | Status: AC
  Administered 2019-05-29: 14:00:00 50 ug via INTRAVENOUS

## 2019-05-29 MED ORDER — LIDOCAINE HCL 2 % IJ SOLN
INTRAMUSCULAR | Status: AC
  Filled 2019-05-29: qty 20

## 2019-05-29 MED ORDER — PROPOFOL 500 MG/50ML IV EMUL
INTRAVENOUS | Status: DC | PRN
  Administered 2019-05-29: 50 ug/kg/min via INTRAVENOUS

## 2019-05-29 MED ORDER — MIDAZOLAM HCL 2 MG/2ML IJ SOLN
1.0000 mg | Freq: Once | INTRAMUSCULAR | Status: AC
  Administered 2019-05-29: 1 mg via INTRAVENOUS

## 2019-05-29 MED ORDER — MIDAZOLAM HCL 2 MG/2ML IJ SOLN
INTRAMUSCULAR | Status: AC
  Administered 2019-05-29: 14:00:00 1 mg via INTRAVENOUS
  Filled 2019-05-29: qty 2

## 2019-05-29 MED ORDER — BUPIVACAINE HCL (PF) 0.5 % IJ SOLN
INTRAMUSCULAR | Status: DC | PRN
  Administered 2019-05-29: 30 mL via PERINEURAL

## 2019-05-29 MED ORDER — LIDOCAINE 2% (20 MG/ML) 5 ML SYRINGE
INTRAMUSCULAR | Status: AC
  Filled 2019-05-29: qty 5

## 2019-05-29 MED ORDER — 0.9 % SODIUM CHLORIDE (POUR BTL) OPTIME
TOPICAL | Status: DC | PRN
  Administered 2019-05-29: 15:00:00 1000 mL

## 2019-05-29 MED ORDER — CHLORHEXIDINE GLUCONATE CLOTH 2 % EX PADS
6.0000 | MEDICATED_PAD | Freq: Once | CUTANEOUS | Status: AC
  Administered 2019-05-29: 09:00:00 6 via TOPICAL

## 2019-05-29 MED ORDER — FENTANYL CITRATE (PF) 100 MCG/2ML IJ SOLN
INTRAMUSCULAR | Status: AC
  Administered 2019-05-29: 50 ug via INTRAVENOUS
  Filled 2019-05-29: qty 2

## 2019-05-29 MED ORDER — PROPOFOL 10 MG/ML IV BOLUS
INTRAVENOUS | Status: DC | PRN
  Administered 2019-05-29: 40 mg via INTRAVENOUS

## 2019-05-29 MED ORDER — BUPIVACAINE HCL (PF) 0.5 % IJ SOLN
INTRAMUSCULAR | Status: AC
  Filled 2019-05-29: qty 30

## 2019-05-29 MED ORDER — PROPOFOL 10 MG/ML IV BOLUS
INTRAVENOUS | Status: AC
  Filled 2019-05-29: qty 20

## 2019-05-29 MED ORDER — MIDAZOLAM HCL 2 MG/2ML IJ SOLN
1.0000 mg | Freq: Once | INTRAMUSCULAR | Status: AC
  Administered 2019-05-29: 14:00:00 1 mg via INTRAVENOUS

## 2019-05-29 MED ORDER — CHLORHEXIDINE GLUCONATE CLOTH 2 % EX PADS
6.0000 | MEDICATED_PAD | Freq: Once | CUTANEOUS | Status: AC
  Administered 2019-05-29: 6 via TOPICAL

## 2019-05-29 MED ORDER — FENTANYL CITRATE (PF) 250 MCG/5ML IJ SOLN
INTRAMUSCULAR | Status: AC
  Filled 2019-05-29: qty 5

## 2019-05-29 MED ORDER — LACTATED RINGERS IV SOLN
INTRAVENOUS | Status: DC | PRN
  Administered 2019-05-29: 15:00:00 via INTRAVENOUS

## 2019-05-29 SURGICAL SUPPLY — 34 items
BLADE LONG MED 31X9 (MISCELLANEOUS) IMPLANT
BNDG CMPR 9X4 STRL LF SNTH (GAUZE/BANDAGES/DRESSINGS) ×1
BNDG CONFORM 2 STRL LF (GAUZE/BANDAGES/DRESSINGS) ×2 IMPLANT
BNDG ELASTIC 3X5.8 VLCR STR LF (GAUZE/BANDAGES/DRESSINGS) ×2 IMPLANT
BNDG ELASTIC 4X5.8 VLCR STR LF (GAUZE/BANDAGES/DRESSINGS) ×1 IMPLANT
BNDG ESMARK 4X9 LF (GAUZE/BANDAGES/DRESSINGS) ×2 IMPLANT
BNDG GAUZE ELAST 4 BULKY (GAUZE/BANDAGES/DRESSINGS) ×2 IMPLANT
COVER WAND RF STERILE (DRAPES) ×2 IMPLANT
CUFF TOURN SGL QUICK 18X4 (TOURNIQUET CUFF) IMPLANT
DRSG EMULSION OIL 3X3 NADH (GAUZE/BANDAGES/DRESSINGS) ×2 IMPLANT
DRSG XEROFORM 1X8 (GAUZE/BANDAGES/DRESSINGS) ×1 IMPLANT
DURAPREP 26ML APPLICATOR (WOUND CARE) ×2 IMPLANT
ELECT REM PT RETURN 9FT ADLT (ELECTROSURGICAL) ×2
ELECTRODE REM PT RTRN 9FT ADLT (ELECTROSURGICAL) ×1 IMPLANT
GAUZE SPONGE 4X4 12PLY STRL (GAUZE/BANDAGES/DRESSINGS) ×2 IMPLANT
GLOVE BIO SURGEON STRL SZ7.5 (GLOVE) ×4 IMPLANT
GLOVE BIOGEL PI IND STRL 7.5 (GLOVE) ×1 IMPLANT
GLOVE BIOGEL PI INDICATOR 7.5 (GLOVE) ×1
GOWN STRL REUS W/ TWL LRG LVL3 (GOWN DISPOSABLE) ×1 IMPLANT
GOWN STRL REUS W/ TWL XL LVL3 (GOWN DISPOSABLE) ×1 IMPLANT
GOWN STRL REUS W/TWL LRG LVL3 (GOWN DISPOSABLE) ×2
GOWN STRL REUS W/TWL XL LVL3 (GOWN DISPOSABLE) ×2
KIT BASIN OR (CUSTOM PROCEDURE TRAY) ×2 IMPLANT
NDL 18GX1X1/2 (RX/OR ONLY) (NEEDLE) IMPLANT
NDL HYPO 25X1 1.5 SAFETY (NEEDLE) ×1 IMPLANT
NEEDLE 18GX1X1/2 (RX/OR ONLY) (NEEDLE) IMPLANT
NEEDLE HYPO 25X1 1.5 SAFETY (NEEDLE) ×2 IMPLANT
NS IRRIG 1000ML POUR BTL (IV SOLUTION) IMPLANT
PACK ORTHO EXTREMITY (CUSTOM PROCEDURE TRAY) IMPLANT
PADDING CAST ABS 4INX4YD NS (CAST SUPPLIES) ×1
PADDING CAST ABS COTTON 4X4 ST (CAST SUPPLIES) ×1 IMPLANT
SUT PROLENE 3 0 PS 2 (SUTURE) ×5 IMPLANT
SYR 10ML LL (SYRINGE) IMPLANT
UNDERPAD 30X30 (UNDERPADS AND DIAPERS) ×2 IMPLANT

## 2019-05-29 NOTE — Op Note (Signed)
PATIENT:  [name]  73 y.o. male  PRE-OPERATIVE DIAGNOSIS:  OSTEOMYELITIS  POST-OPERATIVE DIAGNOSIS:  OSTEOMYELITIS  PROCEDURE:  Procedure(s): AMPUTATION LEFT GREAT TOE (Left)  SURGEON:  Surgeon(s) and Role:    * Trula Slade, DPM - Primary  PHYSICIAN ASSISTANT:   ASSISTANTS: none   ANESTHESIA:   MAC  EBL:  25cc   BLOOD ADMINISTERED:none  DRAINS: none   LOCAL MEDICATIONS USED:  NONE  SPECIMEN:  Source of Specimen:  left hallux toe  DISPOSITION OF SPECIMEN:  PATHOLOGY  COUNTS:  YES  TOURNIQUET:  * No tourniquets in log *  DICTATION: .Dragon Dictation  PLAN OF CARE: Admit to inpatient   PATIENT DISPOSITION:  PACU - hemodynamically stable.   Delay start of Pharmacological VTE agent (>24hrs) due to surgical blood loss or risk of bleeding: no  Indications for surgery: [name] initially was admitted to the hospital with osteomyelitis.  And seen the patient outpatient with a wound I previously ordered arterial studies but not completed.  He underwent vascular testing which revealed moderate disease in his left lower extremity underwent successful stenting yesterday.  MRI confirmed osteomyelitis left hallux.  Given the findings we discussed amputation.  He was given option of conservative care however given the hardware in the leg I recommend amputation to prevent spread and he agrees.  All alternatives, risks, complications were discussed with patient detail.  No promises or guarantees were effective the procedure all questions were answered the best my ability.  Procedure in detail: The patient was both verbally and visually identified by myself and nursing staff and anesthesia staff.  An ankle block was performed by anesthesia staff preoperatively.  He was brought the operating via stretcher and placed in the operative table in supine position.  Ankle tourniquet was applied however this was not inflated during the procedure.  Both her  extremities and scrubbed, prepped, draped in a sterile fashion.  Timeout was performed.  A fishmouth incision was made on the first MPJ.  Incision was made benefits of a scalpel from skin to bone.  The toe was then disarticulated level of the MPJ.  The metatarsal head appeared to be viable.  There is no signs of proximal infection and no abscess or tracking.  Incision was copiously irrigated with saline and hemostasis was achieved.  All bleeders were cauterized.  The skin was then closed with 3-0 Prolene in a simple interrupted fashion without tension.  Xeroform was applied followed by dry sterile dressing.  He was awoken from anesthesia and found to tolerate the procedure well any complications.  He was transferred to PACU vital signs stable and vascular status intact.

## 2019-05-29 NOTE — Anesthesia Preprocedure Evaluation (Addendum)
Anesthesia Evaluation  Patient identified by MRN, date of birth, ID band Patient awake    Reviewed: Allergy & Precautions, NPO status , Patient's Chart, lab work & pertinent test results, reviewed documented beta blocker date and time   History of Anesthesia Complications Negative for: history of anesthetic complications  Airway Mallampati: II  TM Distance: >3 FB Neck ROM: Full    Dental  (+) Upper Dentures, Lower Dentures   Pulmonary asthma , COPD,  COPD inhaler, former smoker,    Pulmonary exam normal        Cardiovascular hypertension, Pt. on medications and Pt. on home beta blockers + CAD and + Peripheral Vascular Disease  Normal cardiovascular exam     Neuro/Psych Anxiety Depression CVA    GI/Hepatic Neg liver ROS, GERD  Medicated and Controlled,  Endo/Other  diabetes, Type 2, Oral Hypoglycemic Agents  Renal/GU Renal InsufficiencyRenal disease     Musculoskeletal  (+) Arthritis ,   Abdominal   Peds  Hematology negative hematology ROS (+)   Anesthesia Other Findings Day of surgery medications reviewed with the patient.  Reproductive/Obstetrics                            Anesthesia Physical Anesthesia Plan  ASA: III  Anesthesia Plan: MAC and Regional   Post-op Pain Management:    Induction:   PONV Risk Score and Plan: Treatment may vary due to age or medical condition and Propofol infusion  Airway Management Planned:   Additional Equipment:   Intra-op Plan:   Post-operative Plan:   Informed Consent: I have reviewed the patients History and Physical, chart, labs and discussed the procedure including the risks, benefits and alternatives for the proposed anesthesia with the patient or authorized representative who has indicated his/her understanding and acceptance.     Dental advisory given  Plan Discussed with: CRNA  Anesthesia Plan Comments:        Anesthesia  Quick Evaluation

## 2019-05-29 NOTE — Progress Notes (Signed)
Patient is in the pre-op holding. He underwent successful stenting yesterday with Dr. Carlis Abbott. Toe still red and swollen along the distal aspect but has improved some. Given osteomyelitis on MRI and recurrent infection will plan for hallux amputation. We again discuss the alternatives, risks, complications. He wishes to proceed with surgery. Surgical consent form signed. NPO. Will plan for toe amputation as scheduled.   Celesta Gentile, DPM O: [phone] C: [phone]

## 2019-05-29 NOTE — Progress Notes (Signed)
I went by to see [name] and he was in cath lab recovery. He had good results from the procedure. We will plan for toe amputation Friday and scheduled at 2:30. As he just got out of the procedure I did call him later Thursday night once he got back to the floor. We discussed the surgery as well as the postoperative course. All alternatives, risks, complications. He wishes to proceed. His wife was also in the waiting room and I spoke to her in detail about the surgery.   Celesta Gentile, DPM O: [phone] C: [phone]

## 2019-05-29 NOTE — Care Management Important Message (Signed)
Important Message  Patient Details  Name: [name] MRN: [id number] Date of Birth: August 07, 1946   Medicare Important Message Given:  Yes     [name] 05/29/2019, 11:15 AM

## 2019-05-29 NOTE — Progress Notes (Signed)
[facility] [place]  Subjective  - States right groin feels better after hematoma noted last night following sheath removal.   Objective 107/62 73 98.4 F (36.9 C) (Oral) (!) 22 98%  Intake/Output Summary (Last 24 hours) at 05/29/2019 0419 Last data filed at 05/29/2019 0300 Gross per 24 hour  Intake 1325.26 ml  Output 2500 ml  Net -1174.74 ml    Right groin hematoma with eccymosis - much softer since last night Palpable right DP pulses bilaterallly  Laboratory Lab Results: Recent Labs    05/28/19 0347 05/28/19 2122  WBC 7.2 10.8*  HGB 13.3 12.7*  HCT 41.4 38.9*  PLT 186 190   BMET Recent Labs    05/27/19 0102 05/28/19 0347  NA 141 141  K 4.0 4.1  CL 105 102  CO2 28 29  GLUCOSE 129* 136*  BUN 18 18  CREATININE 0.83 0.86  CALCIUM 8.7* 8.6*    COAG Lab Results  Component Value Date   INR 1.1 05/26/2019   No results found for: PTT  Assessment/Planning: POD #1 s/p bilateral kissing iliac stents for left common iliac occlusion in setting of left lower extremity tissue loss.  Sheaths were removed in recovery by cath lab and called last night with hematoma noted in right groin when he returned to floor.  I went and held additional pressure myself for 20 minutes in right groin.  Looks much better - some ongoing bruising and fullness.  Palpable DP pulses bilaterally - excellent result.  Will need aspirin and plavix.  Should have excellent inflow for toe amputation.  Marty Heck 05/29/2019 4:19 AM --

## 2019-05-29 NOTE — Progress Notes (Addendum)
PROGRESS NOTE  [name]:[id number] DOB: 30-Dec-1945 DOA: 05/25/2019 PCP: [name], MD  Brief History    [name] is a 73 y.o. year old male with medical history significant for hypertension, hyperlipidemia, diabetes mellitus, COPD, stroke, GERD, gout, depression, anxiety, PVD, back pain, cerebral aneurysm without rupture, AAA who presented on 05/25/2019 with longstanding left greater toe ulcer with worsening redness and drainage despite initial course of doxycycline and was found to have left greater toe cellulitis/osteomyelitis.  Patient underwent left common iliac occlusion on 7/24.  Right groin hematoma shortly after procedure that required manual pressure to be held by vascular surgery team overnight.    A & P  Left hallux ulceration with osteomyelitis.  MRI confirms OM.  We will continue empiric vancomycin and ceftriaxone while monitoring cultures.  Appreciate recommendations from podiatry, Dr. Jacqualyn Posey, plans for toe amputation on 7/24  Critical limb ischemia of left lower extremity with tissue loss and nonhealing wound.  Status post iliac stents for left common iliac occlusion by vascular on 7/23.  Has much better perfusion and palpable DP pulses bilaterally  Hypertension, stable.  Continue home amlodipine, Coreg, losartan, clonidine listed as medication will reconcile with patient  Type 2 diabetes with diabetic neuropathy,  Controlled (A1c 5.9%, 04/24/19).  CBGs at goal here, continue sliding scale as needed.  Holding home oral hypoglycemics.,  Continue Lyrica  History of CVA. Continue aspirin and lipitor  Depression with anxiety. Continue wellbutrin    DVT prophylaxis: CDs Code Status: Full code Family Communication: No family at bedside Disposition Plan: Plan for total amputation on 7/24, will continue IV antibiotics   [facility] Direct contact: see [email or web address] (further directions at bottom of note if needed) 7PM-7AM contact night coverage as at  bottom of note 05/29/2019, 2:23 PM  LOS: 3 days   Consultants  . Podiatry, vascular  Procedures  . None  Antibiotics  . Vancomycin-7/21 . Ceftriaxone 7/21  Interval History/Subjective  No acute complaints.  Feels left foot feels much better  Objective   Vitals:  Vitals:   05/29/19 1410 05/29/19 1415  BP: (!) 168/74 (!) 149/101  Pulse: 86 87  Resp: 20 20  Temp:    SpO2: 98% 98%    Exam:  Awake Alert, Oriented X 3, No new F.N deficits, Normal affect Symmetrical Chest wall movement, Good air movement bilaterally, CTAB RRR,No Gallops,Rubs or new Murmurs, No Parasternal Heave +ve B.Sounds, Abd Soft, No tenderness, No organomegaly appriciated, No rebound - guarding or rigidity. Left greater toe with dressing in place, some mild dry drainage with no obvious surrounding erythema on foot Bruising in right groin Palpable bilateral DP pulses   I have personally reviewed the following:   Today's Data    Lab Data   Recent Labs  Lab 05/25/19 1831 05/26/19 0323 05/27/19 0102 05/28/19 0347 05/29/19 0316  NA 140 139 141 141 141  K 3.9 4.1 4.0 4.1 4.0  CL 103 103 105 102 103  CO2 26 29 28 29 28   GLUCOSE 84 177* 129* 136* 130*  BUN 20 17 18 18 16   CREATININE 0.82 0.72 0.83 0.86 0.74  CALCIUM 9.3 8.9 8.7* 8.6* 8.6*  MG  --   --  2.2  --   --    . Liver Function Tests: No results for input(s): AST, ALT, ALKPHOS, BILITOT, PROT, ALBUMIN in the last 168 hours. No results for input(s): LIPASE, AMYLASE in the last 168 hours. No results for input(s): AMMONIA in the last 168  hours. . CBC: Recent Labs  Lab 05/25/19 1831 05/26/19 0323 05/27/19 0102 05/28/19 0347 05/28/19 2122 05/29/19 0316  WBC 9.9 8.2 7.0 7.2 10.8* 8.2  NEUTROABS 6.2  --  3.3  --  9.1*  --   HGB 14.2 13.7 13.5 13.3 12.7* 12.6*  HCT 44.2 42.5 41.3 41.4 38.9* 39.6  MCV 94.0 93.0 93.0 94.5 93.1 95.7  PLT 208 186 184 186 190 214   . CBG: Recent Labs  Lab 05/28/19 1718 05/28/19 2113 05/29/19 0748  05/29/19 1115 05/29/19 1301  GLUCAP 85 235* 101* 98 101*    Micro Data  . Blood cultures pending, 7/20  Imaging  MRI left foot with and without contrast: Osteomyelitis of the distal phalanx of the great toe. 2. Edema and enhancement of the distal aspect of the proximal . phalanx of the great toe is likely reactive.     Cardiology Data  No results for input(s): CKTOTAL, CKMB, CKMBINDEX, TROPONINI in the last 168 hours.  Other Data  .   Scheduled Meds: . [MAR Hold] amLODipine  5 mg Oral QHS  . [MAR Hold] aspirin EC  81 mg Oral QHS  . [MAR Hold] atorvastatin  80 mg Oral q1800  . [MAR Hold] buPROPion  150 mg Oral Daily  . [MAR Hold] carvedilol  12.5 mg Oral BID WC  . [MAR Hold] cholecalciferol  1,000 Units Oral Daily  . [MAR Hold] clopidogrel  75 mg Oral Q breakfast  . fentaNYL      . [MAR Hold] insulin aspart  0-5 Units Subcutaneous QHS  . [MAR Hold] insulin aspart  0-9 Units Subcutaneous TID WC  . [MAR Hold] latanoprost  1 drop Both Eyes QHS  . [MAR Hold] losartan  50 mg Oral Daily  . [MAR Hold] magnesium oxide  400 mg Oral Daily  . midazolam      . [MAR Hold] multivitamin with minerals  1 tablet Oral Daily  . [MAR Hold] mupirocin cream   Topical Daily  . [MAR Hold] omega-3 acid ethyl esters  1 g Oral Daily  . [MAR Hold] pregabalin  150 mg Oral TID  . [MAR Hold] sodium chloride flush  3 mL Intravenous Q12H  . [MAR Hold] vitamin B-12  1,000 mcg Oral Once per day on Mon Thu  . [MAR Hold] vitamin C  1,000 mg Oral Daily   Continuous Infusions: . sodium chloride 100 mL/hr at 05/28/19 1315  . [MAR Hold] sodium chloride    . [MAR Hold] cefTRIAXone (ROCEPHIN)  IV 1 g (05/29/19 0240)  . [MAR Hold] vancomycin 1,250 mg (05/29/19 0630)    Principal Problem:   Left hallux osteomyelitis ([place]) Active Problems:   Diabetes mellitus type 2 with neurological manifestations ([place])   Hyperlipidemia   Essential hypertension   History of CVA (cerebrovascular accident) without  residual deficits   COPD (chronic obstructive pulmonary disease) ([place])   Depression with anxiety   Cellulitis of left foot   Diabetic ulcer of left great toe ([place])   LOS: 3 days   [name]  [facility] How to contact the [facility] Attending or Consulting provider [place] or covering provider during after hours [place], for this patient?  1. Check the care team in [facility] and look for a) attending/consulting [facility] provider listed and b) the [facility] team listed 2. Log into [email or web address] and use [place]'s universal password to access. If you do not have the password, please contact the hospital operator. 3. Locate the [facility] provider  you are looking for under [facility] and page to a number that you can be directly reached. 4. If you still have difficulty reaching the provider, please page the [facility] (Director on Call) for the Hospitalists listed on amion for assistance.

## 2019-05-29 NOTE — Transfer of Care (Signed)
Immediate Anesthesia Transfer of Care Note  Patient: [name]  Procedure(s) Performed: AMPUTATION LEFT GREAT TOE (Left Toe)  Patient Location: PACU  Anesthesia Type:MAC combined with regional for post-op pain  Level of Consciousness: awake and patient cooperative  Airway & Oxygen Therapy: Patient Spontanous Breathing  Post-op Assessment: Report given to RN and Post -op Vital signs reviewed and stable  Post vital signs: Reviewed and stable  Last Vitals:  Vitals Value Taken Time  BP 99/61 05/29/19 1521  Temp    Pulse 79 05/29/19 1522  Resp 17 05/29/19 1522  SpO2 93 % 05/29/19 1522  Vitals shown include unvalidated device data.  Last Pain:  Vitals:   05/29/19 1430  TempSrc:   PainSc: 0-No pain      Patients Stated Pain Goal: 3 (38/18/29 9371)  Complications: No apparent anesthesia complications

## 2019-05-29 NOTE — Brief Op Note (Signed)
05/29/2019  3:18 PM  PATIENT:  [name]  73 y.o. male  PRE-OPERATIVE DIAGNOSIS:  OSTEOMYELITIS  POST-OPERATIVE DIAGNOSIS:  OSTEOMYELITIS  PROCEDURE:  Procedure(s): AMPUTATION LEFT GREAT TOE (Left)  SURGEON:  Surgeon(s) and Role:    * Trula Slade, DPM - Primary  PHYSICIAN ASSISTANT:   ASSISTANTS: none   ANESTHESIA:   MAC  EBL:  25cc   BLOOD ADMINISTERED:none  DRAINS: none   LOCAL MEDICATIONS USED:  NONE  SPECIMEN:  Source of Specimen:  left hallux toe  DISPOSITION OF SPECIMEN:  PATHOLOGY  COUNTS:  YES  TOURNIQUET:  * No tourniquets in log *  DICTATION: .Dragon Dictation  PLAN OF CARE: Admit to inpatient   PATIENT DISPOSITION:  PACU - hemodynamically stable.   Delay start of Pharmacological VTE agent (>24hrs) due to surgical blood loss or risk of bleeding: no  Intraoperative findings: Toe disarticulated at the level of the MPJ. Metatarsal head appeared viable without signs of infection. No proximal tracking of infection noted, no abscess. Would primarily closed without tension.   Plan: From a surgical standpoint if he does well overnight then he can be discharged home tomorrow. Would like to do a week of oral antibiotics, likely Augmentin. Heel WB in CAM boot. Elevation at all times. I will see him in the office next week for dressing change.

## 2019-05-29 NOTE — Anesthesia Procedure Notes (Signed)
Anesthesia Regional Block: Ankle block   Pre-Anesthetic Checklist: ,, timeout performed, Correct Patient, Correct Site, Correct Laterality, Correct Procedure, Correct Position, site marked, Risks and benefits discussed, pre-op evaluation,  At surgeon's request and post-op pain management  Laterality: Left  Prep: Maximum Sterile Barrier Precautions used, chloraprep       Needles:  Injection technique: Single-shot  Needle Type: Echogenic Needle     Needle Length: 4cm  Needle Gauge: 25     Additional Needles:   (landmark technique)  Narrative:  Start time: 05/29/2019 2:16 PM End time: 05/29/2019 2:20 PM Injection made incrementally with aspirations every 5 mL.  Performed by: Personally  Anesthesiologist: Brennan Bailey, MD  Additional Notes: Risks, benefits, and alternative discussed. Patient gave consent for procedure. Patient prepped and draped in sterile fashion. Sedation administered, patient remains easily responsive to voice. Local anesthetic given in 5cc increments with no signs or symptoms of intravascular injection. No pain or paraesthesias with injection. Patient monitored throughout procedure with signs of LAST or immediate complications. Tolerated well.    Tawny Asal, MD

## 2019-05-29 NOTE — Progress Notes (Signed)
Orthopedic Tech Progress Note Patient Details:  [name] [birth year] [id number]  Ortho Devices Type of Ortho Device: CAM walker Ortho Device/Splint Location: left in room Ortho Device/Splint Interventions: Renard Matter 05/29/2019, 5:49 PM

## 2019-05-30 DIAGNOSIS — E1151 Type 2 diabetes mellitus with diabetic peripheral angiopathy without gangrene: Secondary | ICD-10-CM

## 2019-05-30 LAB — CULTURE, BLOOD (ROUTINE X 2)
Culture: NO GROWTH
Culture: NO GROWTH
Special Requests: ADEQUATE
Special Requests: ADEQUATE

## 2019-05-30 LAB — GLUCOSE, CAPILLARY: Glucose-Capillary: 130 mg/dL — ABNORMAL HIGH (ref 70–99)

## 2019-05-30 MED ORDER — AMOXICILLIN-POT CLAVULANATE 875-125 MG PO TABS: 1.0000 | ORAL_TABLET | Freq: Two times a day (BID) | ORAL | 0 refills | Status: DC

## 2019-05-30 MED ORDER — CLOPIDOGREL BISULFATE 75 MG PO TABS: 75.0000 mg | ORAL_TABLET | Freq: Every day | ORAL | 2 refills | Status: DC

## 2019-05-30 MED ORDER — HYDROCODONE-ACETAMINOPHEN 5-325 MG PO TABS: 1.0000 | ORAL_TABLET | ORAL | 0 refills | Status: DC | PRN

## 2019-05-30 NOTE — Progress Notes (Signed)
Orthopedic Tech Progress Note Patient Details:  [name] May 31, 1946 [id number]  Ortho Devices Type of Ortho Device: Darco shoe Ortho Device/Splint Location: left Ortho Device/Splint Interventions: Application   Post Interventions Patient Tolerated: Well Instructions Provided: Care of device   [name] 05/30/2019, 10:31 AM

## 2019-05-30 NOTE — Discharge Summary (Signed)
[name] WPY:[id number] DOB: February 28, 1946 DOA: 05/25/2019  PCP: [name], MD  Admit date: 05/25/2019 Discharge date: 05/30/2019  Admitted From: HOME Disposition: HOME  Recommendations for Outpatient Follow-up:  1. Follow up with PCP in 1-2 weeks, Vascular (Dr. Thana Ates) in 3-4 weeks, Podiatry ( Dr. Jacqualyn Posey) on 7/28 2. Please obtain BMP/CBC in one week 3. New medications: short script of pain medications provided,augmentin x 7 days aspirin and plavix  Home Health: No Equipment/Devices: Left Darco wedge shoe, cam boot  Discharge Condition: Stable CODE STATUS: Full Diet recommendation: Carb modified  Brief/Interim Summary: History of present illness:     [name] is a 73 y.o. year old male with medical history significant for hypertension, hyperlipidemia, diabetes mellitus, COPD, stroke, GERD, gout, depression, anxiety, PVD, back pain, cerebral aneurysm without rupture, AAA who presented on 05/25/2019 with longstanding left greater toe ulcer with worsening redness and drainage despite initial course of doxycycline and was found to have left greater toe cellulitis/osteomyelitis. Remaining hospital course addressed in problem based format below:   Hospital Course:   Left hallux ulceration with osteomyelitis.  MRI confirmed OM.  Empirically treated with vancomycin and ceftriaxone.  Blood cultures remain negative.  Patient underwent left hallux amputation by Dr. Jacqualyn Posey on 0/53 without complications.  Remained afebrile without any signs or symptoms of sepsis throughout hospital stay.  Will discharge on 1 week of Augmentin.  Has close follow-up arranged with podiatry , Dr. Jacqualyn Posey  Critical limb ischemiaofleft lower extremity with tissue loss and nonhealing wound.  Status post iliac stents for left common iliac occlusion by vascular on 7/23.  Has much better perfusion and palpable DP pulses bilaterally.  Has right groin ecchymosis with no significant hematoma.  Continue aspirin and  Plavix on discharge.  Vascular has arranged follow-up  Hypertension, stable.  Continue home amlodipine, Coreg, losartan, patient was discontinued off clonidine and HCTZ prior to admission several months ago  Type 2 diabetes with diabetic neuropathy,  Controlled (A1c 5.9%, 04/24/19).  CBGs at goal here, resume home oral hypoglycemics  History of CVA. Continue aspirin and lipitor  Depression with anxiety. Continue wellbutrin   Consultations:  Podiatry, vascular surgery  Procedures/Studies: Left hallux amputation, May 31, 2023  Lateral iliac stents for left common iliac artery occlusion on 7/23  Subjective: Feels fine this morning no foot pain.  No chest pain or shortness of breath.  Discharge Exam: Vitals:   05/30/19 0527 05/30/19 0757  BP: 123/61 120/68  Pulse: 76   Resp: 20 (!) 24  Temp: 97.9 F (36.6 C) 98.3 F (36.8 C)  SpO2: 92% 93%   Vitals:   May 31, 2019 2100 2019/05/31 2200 05/30/19 0527 05/30/19 0757  BP: 116/63 114/65 123/61 120/68  Pulse: 72 70 76   Resp: 18 20 20  (!) 24  Temp:   97.9 F (36.6 C) 98.3 F (36.8 C)  TempSrc:   Oral Oral  SpO2: 91% 91% 92% 93%  Weight:      Height:        Awake Alert, Oriented X 3, No new F.N deficits, Normal affect Symmetrical Chest wall movement, Good air movement bilaterally, CTAB RRR,No Gallops,Rubs or new Murmurs, No Parasternal Heave +ve B.Sounds, Abd Soft, No tenderness, No organomegaly appreciated, No rebound - guarding or rigidity. Clean left foot dressing in place Bruising in right groin stable Palpable bilateral DP pulses   Discharge Diagnoses:  Principal Problem:   Left hallux osteomyelitis ([place]) Active Problems:   Diabetes mellitus type 2 with neurological manifestations ([place])  Hyperlipidemia   Essential hypertension   History of CVA (cerebrovascular accident) without residual deficits   COPD (chronic obstructive pulmonary disease) (H[facility])   Depression with anxiety   Cellulitis of left foot   Diabetic ulcer  of left great toe [facility])   Critical lower limb ischemia    Discharge Instructions  Discharge Instructions    Diet - low sodium heart healthy   Complete by: As directed    Increase activity slowly   Complete by: As directed      Allergies as of 05/30/2019      Reactions   No Known Allergies       Medication List    STOP taking these medications   cloNIDine 0.2 MG tablet Commonly known as: CATAPRES   doxycycline 100 MG tablet Commonly known as: VIBRA-TABS   hydrochlorothiazide 12.5 MG capsule Commonly known as: MICROZIDE     TAKE these medications   amLODipine 5 MG tablet Commonly known as: NORVASC Take 1 tablet (5 mg total) by mouth at bedtime.   aspirin EC 81 MG tablet Take 81 mg by mouth at bedtime.   atorvastatin 80 MG tablet Commonly known as: LIPITOR TAKE 1 TABLET BY MOUTH  EVERY DAY AT 6PM What changed: See the new instructions.   buPROPion 150 MG 12 hr tablet Commonly known as: Wellbutrin SR Take 1 tablet (150 mg total) by mouth daily.   carvedilol 25 MG tablet Commonly known as: COREG TAKE 1/2 TABLET BY MOUTH  TWICE A DAY WITH MEALS What changed: See the new instructions.   cholecalciferol 1000 units tablet Commonly known as: VITAMIN D Take 1,000 Units by mouth daily.   clopidogrel 75 MG tablet Commonly known as: PLAVIX Take 1 tablet (75 mg total) by mouth daily with breakfast. Start taking on: May 31, 2019   Fish Oil 1000 MG Caps Take 1,000 mg by mouth 2 (two) times daily.   glimepiride 4 MG tablet Commonly known as: AMARYL TAKE 1 TABLET BY MOUTH TWO  TIMES DAILY What changed: when to take this   HYDROcodone-acetaminophen 5-325 MG tablet Commonly known as: NORCO/VICODIN Take 1 tablet by mouth every 4 (four) hours as needed for moderate pain. What changed:   how much to take  when to take this   latanoprost 0.005 % ophthalmic solution Commonly known as: XALATAN Place 1 drop into both eyes at bedtime.   losartan 50 MG  tablet Commonly known as: COZAAR TAKE 1 TABLET BY MOUTH  DAILY   Magnesium 500 MG Tabs Take 500 mg by mouth daily.   metFORMIN 1000 MG tablet Commonly known as: GLUCOPHAGE TAKE 1 TABLET BY MOUTH  EVERY MORNING, 1/2 TABLET  AT LUNCH AND 1 TABLET EVERY EVENING. What changed: See the new instructions.   multivitamin tablet Take 1 tablet by mouth daily.   pregabalin 150 MG capsule Commonly known as: Lyrica Take 1 capsule (150 mg total) by mouth 3 (three) times daily.   ProAir HFA 108 (90 Base) MCG/ACT inhaler Generic drug: albuterol USE 2 PUFFS EVERY 6 HOURS  AS NEEDED FOR WHEEZING OR  SHORTNESS OF BREATH What changed: See the new instructions.   vitamin B-12 1000 MCG tablet Commonly known as: CYANOCOBALAMIN Take 1,000 mcg by mouth 2 (two) times a week. Monday and Thursday   vitamin C 1000 MG tablet Take 1,000 mg by mouth daily.      Follow-up Information    Marty Heck, MD Follow up in 3 week(s).   Specialty: Vascular Surgery Why: office  will call Contact information: [address] [place] [address] [phone]          Allergies  Allergen Reactions  . No Known Allergies         The results of significant diagnostics from this hospitalization (including imaging, microbiology, ancillary and laboratory) are listed below for reference.     Microbiology: Recent Results (from the past 240 hour(s))  Culture, blood (routine x 2)     Status: None   Collection Time: 05/25/19  6:40 PM   Specimen: BLOOD  Result Value Ref Range Status   Specimen Description   Final    BLOOD LEFT ANTECUBITAL Performed at [facility], [address] [place], [place] [address]    Special Requests   Final    BOTTLES DRAWN AEROBIC AND ANAEROBIC Blood Culture adequate volume Performed at [facility], [address] [place], [place] [address]    Culture   Final    NO GROWTH 5 DAYS Performed at [facility], [place] [address]  [place], [place] [address]    Report Status 05/30/2019 FINAL  Final  Culture, blood (routine x 2)     Status: None   Collection Time: 05/25/19  6:45 PM   Specimen: BLOOD  Result Value Ref Range Status   Specimen Description   Final    BLOOD BLOOD LEFT HAND Performed at [facility], [address] [place], [place] [address]    Special Requests   Final    BOTTLES DRAWN AEROBIC AND ANAEROBIC Blood Culture adequate volume Performed at [facility], [address] [place], [place] [address]    Culture   Final    NO GROWTH 5 DAYS Performed at [facility], [place] [address], [place],  [place] [address]    Report Status 05/30/2019 FINAL  Final  SARS Coronavirus 2 (Performed in [place] hospital lab)     Status: None   Collection Time: 05/25/19  8:19 PM   Specimen: Nasopharyngeal Swab  Result Value Ref Range Status   SARS Coronavirus 2 NEGATIVE NEGATIVE Final    Comment: (NOTE) If result is NEGATIVE SARS-CoV-2 target nucleic acids are NOT DETECTED. The SARS-CoV-2 RNA is generally detectable in upper and lower  respiratory specimens during the acute phase of infection. The lowest  concentration of SARS-CoV-2 viral copies this assay can detect is 250  copies / mL. A negative result does not preclude SARS-CoV-2 infection  and should not be used as the sole basis for treatment or other  patient management decisions.  A negative result may occur with  improper specimen collection / handling, submission of specimen other  than nasopharyngeal swab, presence of viral mutation(s) within the  areas targeted by this assay, and inadequate number of viral copies  (<250 copies / mL). A negative result must be combined with clinical  observations, patient history, and epidemiological information. If result is POSITIVE SARS-CoV-2 target nucleic acids are DETECTED. The SARS-CoV-2 RNA is generally detectable in upper and lower  respiratory specimens dur ing the acute phase of  infection.  Positive  results are indicative of active infection with SARS-CoV-2.  Clinical  correlation with patient history and other diagnostic information is  necessary to determine patient infection status.  Positive results do  not rule out bacterial infection or co-infection with other viruses. If result is PRESUMPTIVE POSTIVE SARS-CoV-2 nucleic acids MAY BE PRESENT.   A presumptive positive result was obtained  on the submitted specimen  and confirmed on repeat testing.  While 2019 novel coronavirus  (SARS-CoV-2) nucleic acids may be present in the submitted sample  additional confirmatory testing may be necessary for epidemiological  and / or clinical management purposes  to differentiate between  SARS-CoV-2 and other Sarbecovirus currently known to infect humans.  If clinically indicated additional testing with an alternate test  methodology [phone]) is advised. The SARS-CoV-2 RNA is generally  detectable in upper and lower respiratory sp ecimens during the acute  phase of infection. The expected result is Negative. Fact Sheet for Patients:  StrictlyIdeas.no Fact Sheet for Healthcare Providers: BankingDealers.co.za This test is not yet approved or cleared by the [place] FDA and has been authorized for detection and/or diagnosis of SARS-CoV-2 by FDA under an Emergency Use Authorization (EUA).  This EUA will remain in effect (meaning this test can be used) for the duration of the COVID-19 declaration under Section 564(b)(1) of the Act, 21 U.S.C. section 360bbb-3(b)(1), unless the authorization is terminated or revoked sooner. Performed at [facility], [address] [place], [place] [address]      Labs: BNP (last 3 results) No results for input(s): BNP in the last 8760 hours. Basic Metabolic Panel: Recent Labs  Lab 05/25/19 1831 05/26/19 0323 05/27/19 0102 05/28/19 0347 05/29/19 0316  NA 140 139 141  141 141  K 3.9 4.1 4.0 4.1 4.0  CL 103 103 105 102 103  CO2 26 29 28 29 28   GLUCOSE 84 177* 129* 136* 130*  BUN 20 17 18 18 16   CREATININE 0.82 0.72 0.83 0.86 0.74  CALCIUM 9.3 8.9 8.7* 8.6* 8.6*  MG  --   --  2.2  --   --    Liver Function Tests: No results for input(s): AST, ALT, ALKPHOS, BILITOT, PROT, ALBUMIN in the last 168 hours. No results for input(s): LIPASE, AMYLASE in the last 168 hours. No results for input(s): AMMONIA in the last 168 hours. CBC: Recent Labs  Lab 05/25/19 1831 05/26/19 0323 05/27/19 0102 05/28/19 0347 05/28/19 2122 05/29/19 0316  WBC 9.9 8.2 7.0 7.2 10.8* 8.2  NEUTROABS 6.2  --  3.3  --  9.1*  --   HGB 14.2 13.7 13.5 13.3 12.7* 12.6*  HCT 44.2 42.5 41.3 41.4 38.9* 39.6  MCV 94.0 93.0 93.0 94.5 93.1 95.7  PLT 208 186 184 186 190 214   Cardiac Enzymes: No results for input(s): CKTOTAL, CKMB, CKMBINDEX, TROPONINI in the last 168 hours. BNP: Invalid input(s): POCBNP CBG: Recent Labs  Lab 05/29/19 1301 05/29/19 1532 05/29/19 1601 05/29/19 2057 05/30/19 0638  GLUCAP 101* 113* 161* 228* 130*   D-Dimer No results for input(s): DDIMER in the last 72 hours. Hgb A1c No results for input(s): HGBA1C in the last 72 hours. Lipid Profile No results for input(s): CHOL, HDL, LDLCALC, TRIG, CHOLHDL, LDLDIRECT in the last 72 hours. Thyroid function studies No results for input(s): TSH, T4TOTAL, T3FREE, THYROIDAB in the last 72 hours.  Invalid input(s): FREET3 Anemia work up No results for input(s): VITAMINB12, FOLATE, FERRITIN, TIBC, IRON, RETICCTPCT in the last 72 hours. Urinalysis    Component Value Date/Time   BILIRUBINUR Small 07/15/2015 1103   PROTEINUR 100 07/15/2015 1103   UROBILINOGEN 0.2 07/15/2015 1103   NITRITE Negative 07/15/2015 1103   LEUKOCYTESUR Negative 07/15/2015 1103   Sepsis Labs Invalid input(s): PROCALCITONIN,  WBC,  LACTICIDVEN Microbiology Recent Results (from the past 240 hour(s))  Culture, blood (routine x 2)  Status: None   Collection Time: 05/25/19  6:40 PM   Specimen: BLOOD  Result Value Ref Range Status   Specimen Description   Final    BLOOD LEFT ANTECUBITAL Performed at [facility], [address] [place], [place] [address]    Special Requests   Final    BOTTLES DRAWN AEROBIC AND ANAEROBIC Blood Culture adequate volume Performed at [facility], [address] [place], [place] [address]    Culture   Final    NO GROWTH 5 DAYS Performed at [facility], [place] [address], [place], [place] [address]    Report Status 05/30/2019 FINAL  Final  Culture, blood (routine x 2)     Status: None   Collection Time: 05/25/19  6:45 PM   Specimen: BLOOD  Result Value Ref Range Status   Specimen Description   Final    BLOOD BLOOD LEFT HAND Performed at [facility], [address] [place], [place] [address]    Special Requests   Final    BOTTLES DRAWN AEROBIC AND ANAEROBIC Blood Culture adequate volume Performed at [facility], [address] [place], [place] [address]    Culture   Final    NO GROWTH 5 DAYS Performed at [facility], [place] [address] [place], [place] [address]    Report Status 05/30/2019 FINAL  Final  SARS Coronavirus 2 (Performed in [place] hospital lab)     Status: None   Collection Time: 05/25/19  8:19 PM   Specimen: Nasopharyngeal Swab  Result Value Ref Range Status   SARS Coronavirus 2 NEGATIVE NEGATIVE Final    Comment: (NOTE) If result is NEGATIVE SARS-CoV-2 target nucleic acids are NOT DETECTED. The SARS-CoV-2 RNA is generally detectable in upper and lower  respiratory specimens during the acute phase of infection. The lowest  concentration of SARS-CoV-2 viral copies this assay can detect is 250  copies / mL. A negative result does not preclude SARS-CoV-2 infection  and should not be used as the sole basis for treatment or other  patient management decisions.  A negative result may occur with   improper specimen collection / handling, submission of specimen other  than nasopharyngeal swab, presence of viral mutation(s) within the  areas targeted by this assay, and inadequate number of viral copies  (<250 copies / mL). A negative result must be combined with clinical  observations, patient history, and epidemiological information. If result is POSITIVE SARS-CoV-2 target nucleic acids are DETECTED. The SARS-CoV-2 RNA is generally detectable in upper and lower  respiratory specimens dur ing the acute phase of infection.  Positive  results are indicative of active infection with SARS-CoV-2.  Clinical  correlation with patient history and other diagnostic information is  necessary to determine patient infection status.  Positive results do  not rule out bacterial infection or co-infection with other viruses. If result is PRESUMPTIVE POSTIVE SARS-CoV-2 nucleic acids MAY BE PRESENT.   A presumptive positive result was obtained on the submitted specimen  and confirmed on repeat testing.  While 2019 novel coronavirus  (SARS-CoV-2) nucleic acids may be present in the submitted sample  additional confirmatory testing may be necessary for epidemiological  and / or clinical management purposes  to differentiate between  SARS-CoV-2 and other Sarbecovirus currently known to infect humans.  If clinically indicated additional testing with an alternate test  methodology [phone]) is advised. The SARS-CoV-2 RNA is generally  detectable in  upper and lower respiratory sp ecimens during the acute  phase of infection. The expected result is Negative. Fact Sheet for Patients:  StrictlyIdeas.no Fact Sheet for Healthcare Providers: BankingDealers.co.za This test is not yet approved or cleared by the [place] FDA and has been authorized for detection and/or diagnosis of SARS-CoV-2 by FDA under an Emergency Use Authorization (EUA).  This EUA will  remain in effect (meaning this test can be used) for the duration of the COVID-19 declaration under Section 564(b)(1) of the Act, 21 U.S.C. section 360bbb-3(b)(1), unless the authorization is terminated or revoked sooner. Performed at [facility], [address] [place], [place] [address]      Time coordinating discharge: Over 30 minutes  SIGNED:   [name], MD  [facility] 05/30/2019, 8:58 AM Pager   If 7PM-7AM, please contact night-coverage [email or web address] Password [email or web address]

## 2019-05-30 NOTE — Progress Notes (Signed)
Subjective: POD #1 s/p left hallux amputation. He is doing well and his pain is currently controlled. He is getting mild pain where the toe use to be. Otherwise he has no concerns.  Denies any systemic complaints such as fevers, chills, nausea, vomiting. No acute changes since last appointment, and no other complaints at this time.   Objective: AAO x3, NAD Bandage clean, dry, intact without any drainage on the bandage. No other open lesions are identified today.  No pain with calf compression, swelling, warmth, erythema  Assessment: POD #1 s/p left hallux amputation  Plan: From my standpoint his pain is controlled and can be discharged today. Recommend 1 week for oral antibiotics, Augmentin. I have ordered a [place]. The boot does not seem to fit with the bandage. We discussed at length today the importance of staying off of his foot and keeping it elevated. He has a walker at home he can use for assistance. He has a follow up with me on Tuesday morning. Encouraged to call with any questions, concerns or any changes.   Celesta Gentile, DPM O: [phone] C: [phone]

## 2019-05-30 NOTE — Progress Notes (Addendum)
[facility]  Subjective  - Doing well, states left LE has pain at amputation site, but feels better over all.  Objective 120/68 76 98.3 F (36.8 C) (Oral) (!) 24 93%  Intake/Output Summary (Last 24 hours) at 05/30/2019 0813 Last data filed at 05/30/2019 0631 Gross per 24 hour  Intake 2690 ml  Output 2020 ml  Net 670 ml    Palpable right DP, doppler left AT Left foot dressing clean and dry Groin on the right soft with ecchymosis Lungs non labored breathing  Gen NAD  Assessment/Planning: POD # 2  s/p bilateral kissing iliac stents for left common iliac occlusion in setting of left lower extremity tissue loss.  Patent stents B, palpable right DP and doppler DP left. S/P left GT amputation followed by Dr. Jacqualyn Posey F/U with Dr. Carlis Abbott in 3-4 weeks Iliac duplex and [name]  Carotid duplex 05/27/2019 < 39% stenosis B ICA S/P CEA B by Dr. Scot Dock 2008 AAA Followed by Dr. Gwenlyn Found 3.9cm in January 2020  Stable for discharge from a vascular point of view.  Roxy Horseman 05/30/2019 8:13 AM --  Laboratory Lab Results: Recent Labs    05/28/19 2122 05/29/19 0316  WBC 10.8* 8.2  HGB 12.7* 12.6*  HCT 38.9* 39.6  PLT 190 214   BMET Recent Labs    05/28/19 0347 05/29/19 0316  NA 141 141  K 4.1 4.0  CL 102 103  CO2 29 28  GLUCOSE 136* 130*  BUN 18 16  CREATININE 0.86 0.74  CALCIUM 8.6* 8.6*    COAG Lab Results  Component Value Date   INR 1.1 05/26/2019   No results found for: PTT  I have interviewed the patient and examined the patient. I agree with the findings by the PA.  He has brisk Doppler flow in the left foot.  He has ecchymosis in the right groin but no significant hematoma.  Vascular surgery will be available as needed.  We have arranged follow-up.  Gae Gallop, MD [phone]

## 2019-05-30 NOTE — Progress Notes (Signed)
Removed PIV access and pt received discharge instructions with prescriptions. Pt took Lt. Leg bracae and Darco shoe. Pt understood it well. HS [employer]

## 2019-05-31 NOTE — Anesthesia Postprocedure Evaluation (Signed)
Anesthesia Post Note  Patient: [name]  Procedure(s) Performed: AMPUTATION LEFT GREAT TOE (Left Toe)     Patient location during evaluation: PACU Anesthesia Type: Regional Level of consciousness: awake and alert Pain management: pain level controlled Vital Signs Assessment: post-procedure vital signs reviewed and stable Respiratory status: spontaneous breathing, nonlabored ventilation and respiratory function stable Cardiovascular status: blood pressure returned to baseline and stable Postop Assessment: no apparent nausea or vomiting Anesthetic complications: no                 Brennan Bailey

## 2019-06-01 ENCOUNTER — Encounter (HOSPITAL_COMMUNITY): Payer: Self-pay | Admitting: Podiatry

## 2019-06-02 ENCOUNTER — Other Ambulatory Visit: Payer: Self-pay

## 2019-06-02 ENCOUNTER — Encounter (INDEPENDENT_AMBULATORY_CARE_PROVIDER_SITE_OTHER): Payer: Self-pay | Admitting: Bariatrics

## 2019-06-02 ENCOUNTER — Ambulatory Visit (INDEPENDENT_AMBULATORY_CARE_PROVIDER_SITE_OTHER): Payer: Self-pay | Admitting: Podiatry

## 2019-06-02 ENCOUNTER — Ambulatory Visit (INDEPENDENT_AMBULATORY_CARE_PROVIDER_SITE_OTHER): Payer: Medicare Other

## 2019-06-02 ENCOUNTER — Telehealth (INDEPENDENT_AMBULATORY_CARE_PROVIDER_SITE_OTHER): Payer: Medicare Other | Admitting: Bariatrics

## 2019-06-02 DIAGNOSIS — E669 Obesity, unspecified: Secondary | ICD-10-CM

## 2019-06-02 DIAGNOSIS — Z9889 Other specified postprocedural states: Secondary | ICD-10-CM

## 2019-06-02 DIAGNOSIS — S98132A Complete traumatic amputation of one left lesser toe, initial encounter: Secondary | ICD-10-CM

## 2019-06-02 DIAGNOSIS — E1149 Type 2 diabetes mellitus with other diabetic neurological complication: Secondary | ICD-10-CM

## 2019-06-02 DIAGNOSIS — Z6834 Body mass index (BMI) 34.0-34.9, adult: Secondary | ICD-10-CM

## 2019-06-02 DIAGNOSIS — I10 Essential (primary) hypertension: Secondary | ICD-10-CM | POA: Diagnosis not present

## 2019-06-02 DIAGNOSIS — M545 Low back pain: Secondary | ICD-10-CM | POA: Diagnosis not present

## 2019-06-02 DIAGNOSIS — F3289 Other specified depressive episodes: Secondary | ICD-10-CM | POA: Diagnosis not present

## 2019-06-02 MED ORDER — BUPROPION HCL ER (SR) 150 MG PO TB12: 150.0000 mg | ORAL_TABLET | Freq: Every day | ORAL | 0 refills | Status: DC
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
## 2019-06-04 ENCOUNTER — Telehealth: Payer: Self-pay | Admitting: Podiatry

## 2019-06-04 ENCOUNTER — Encounter: Payer: Self-pay | Admitting: Podiatry

## 2019-06-04 NOTE — Telephone Encounter (Signed)
Left message informing pt I was calling to get more information for Dr. Jacqualyn Posey or to see if I could help, I would pass this message to Dr. Jacqualyn Posey or he could call me again tomorrow.

## 2019-06-04 NOTE — Telephone Encounter (Signed)
I tried to call 3 times. Went right to [employer]. Sent [employer].

## 2019-06-04 NOTE — Telephone Encounter (Signed)
Pt called requesting a call from [place]. Pt did not give any further information, only that he would like a call back.

## 2019-06-05 ENCOUNTER — Other Ambulatory Visit: Payer: Self-pay | Admitting: Podiatry

## 2019-06-05 MED ORDER — AMOXICILLIN-POT CLAVULANATE 875-125 MG PO TABS: 1.0000 | ORAL_TABLET | Freq: Two times a day (BID) | ORAL | 0 refills | Status: DC

## 2019-06-06 ENCOUNTER — Encounter: Payer: Self-pay | Admitting: Family Medicine

## 2019-06-08 MED ORDER — GLIMEPIRIDE 4 MG PO TABS: 4.0000 mg | ORAL_TABLET | Freq: Two times a day (BID) | ORAL | 1 refills | Status: DC

## 2019-06-08 MED ORDER — CARVEDILOL 25 MG PO TABS: 12.5000 mg | ORAL_TABLET | Freq: Two times a day (BID) | ORAL | 1 refills | Status: DC

## 2019-06-08 MED ORDER — LOSARTAN POTASSIUM 50 MG PO TABS: 50.0000 mg | ORAL_TABLET | Freq: Every day | ORAL | 1 refills | Status: DC

## 2019-06-08 NOTE — Progress Notes (Signed)
Subjective: [name] is a 73 y.o. is seen today in office s/p left hallux amputation preformed on 05/29/2019. His pain has been controlled. He is still on Augmentin. He has been trying to stay off of his foot as much as possible.  Denies any systemic complaints such as fevers, chills, nausea, vomiting. No calf pain, chest pain, shortness of breath.   Objective: General: No acute distress, AAOx3  DP/PT pulses palpable 2/4, CRT < 3 sec to all digits.  LEFT foot: Incision is well coapted without any evidence of dehiscence. Mild erythema around the incision but this is associated with the swelling. There is some fresh blood on the bandage. no purulence expressed. No ascending cellulitis. No fluctuance, crepitance, malodor.  No other open lesions or pre-ulcerative lesions.  No pain with calf compression, swelling, warmth, erythema.   Assessment and Plan:  Status post left hallux amputation  -Treatment options discussed including all alternatives, risks, and complications -X-rays were obtained and reviewed with the patient. Status postop hallux amputation.  -Betadine was applied followed by a DSD -NWB for now -Continue Augmentin -Monitor for any clinical signs or symptoms of infection and DVT/PE and directed to call the office immediately should any occur or go to the ER. -Follow-up in 1 week or sooner if any problems arise. In the meantime, encouraged to call the office with any questions, concerns, change in symptoms.   Celesta Gentile, DPM

## 2019-06-09 ENCOUNTER — Encounter: Payer: Self-pay | Admitting: Podiatry

## 2019-06-09 ENCOUNTER — Other Ambulatory Visit: Payer: Self-pay

## 2019-06-09 ENCOUNTER — Ambulatory Visit (INDEPENDENT_AMBULATORY_CARE_PROVIDER_SITE_OTHER): Payer: Medicare Other | Admitting: Podiatry

## 2019-06-09 VITALS — Temp 97.4°F

## 2019-06-09 DIAGNOSIS — Z9889 Other specified postprocedural states: Secondary | ICD-10-CM

## 2019-06-09 DIAGNOSIS — M869 Osteomyelitis, unspecified: Secondary | ICD-10-CM

## 2019-06-09 MED ORDER — AMOXICILLIN-POT CLAVULANATE 875-125 MG PO TABS: 1.0000 | ORAL_TABLET | Freq: Two times a day (BID) | ORAL | 0 refills | Status: AC

## 2019-06-10 ENCOUNTER — Ambulatory Visit
Admission: RE | Admit: 2019-06-10 | Discharge: 2019-06-10 | Disposition: A | Discharge status: Home / Self Care | Payer: Medicare Other | Source: Ambulatory Visit | Attending: Orthopedic Surgery | Admitting: Orthopedic Surgery

## 2019-06-10 DIAGNOSIS — M75101 Unspecified rotator cuff tear or rupture of right shoulder, not specified as traumatic: Secondary | ICD-10-CM

## 2019-06-10 DIAGNOSIS — M25511 Pain in right shoulder: Secondary | ICD-10-CM

## 2019-06-10 DIAGNOSIS — M75111 Incomplete rotator cuff tear or rupture of right shoulder, not specified as traumatic: Secondary | ICD-10-CM | POA: Diagnosis not present

## 2019-06-10 DIAGNOSIS — M19011 Primary osteoarthritis, right shoulder: Secondary | ICD-10-CM | POA: Diagnosis not present

## 2019-06-10 DIAGNOSIS — R600 Localized edema: Secondary | ICD-10-CM | POA: Diagnosis not present

## 2019-06-10 NOTE — Progress Notes (Signed)
Subjective: [name] is a 73 y.o. is seen today in office s/p left hallux amputation preformed on 05/29/2019.  States he is doing well.  He is on Augmentin.  He has been wearing the Darco wedge shoe and using a walker.  His pain is controlled.  Denies any systemic complaints such as fevers, chills, nausea, vomiting. No calf pain, chest pain, shortness of breath.   Objective: General: No acute distress, AAOx3  DP/PT pulses palpable 2/4, CRT < 3 sec to all digits.  LEFT foot: Incision is well coapted without any evidence of dehiscence.  There is mild swelling erythema is noted on the incision but no ascending cellulitis or increase in warmth there is no drainage or pus coming from the incision.  No fluctuation crepitation any malodor.  No other lesions.  Hammertoe contractures present. No other open lesions or pre-ulcerative lesions.  No pain with calf compression, swelling, warmth, erythema.   Assessment and Plan:  Status post left hallux amputation  -Treatment options discussed including all alternatives, risks, and complications -Incision appears to be healing well.  Would continue Augmentin given the mild erythema but I think this is more from inflammation as opposed to infection given the swelling and recent surgery. -Limit weightbearing.  Elevation. -Refill Augmentin -We will set up home health.  [facility] was contacted.  Change the dressing 3 times a week.  Apply small meta Betadine to the incision followed by nonstick dressing of 4 x 4's, Kerlix, Ace. -Monitor for any clinical signs or symptoms of infection and directed to call the office immediately should any occur or go to the ER.   [name]

## 2019-06-11 ENCOUNTER — Telehealth: Payer: Self-pay | Admitting: *Deleted

## 2019-06-11 NOTE — Telephone Encounter (Signed)
Dr. Jacqualyn Posey ordered dressing changes 3 x week, apply small amount of betadine to the incision to left hallux amputation, cover with non-stick dressing, then 4x4, kerlix and ace wrap. Orders faxed on required form, with clinicals and demographics to [facility].

## 2019-06-12 ENCOUNTER — Other Ambulatory Visit: Payer: Self-pay

## 2019-06-12 ENCOUNTER — Ambulatory Visit (INDEPENDENT_AMBULATORY_CARE_PROVIDER_SITE_OTHER): Payer: Medicare Other | Admitting: Family Medicine

## 2019-06-12 ENCOUNTER — Telehealth: Payer: Self-pay | Admitting: Podiatry

## 2019-06-12 DIAGNOSIS — E7849 Other hyperlipidemia: Secondary | ICD-10-CM | POA: Diagnosis not present

## 2019-06-12 DIAGNOSIS — E119 Type 2 diabetes mellitus without complications: Secondary | ICD-10-CM | POA: Diagnosis not present

## 2019-06-12 DIAGNOSIS — M869 Osteomyelitis, unspecified: Secondary | ICD-10-CM

## 2019-06-12 DIAGNOSIS — I10 Essential (primary) hypertension: Secondary | ICD-10-CM | POA: Diagnosis not present

## 2019-06-12 DIAGNOSIS — E1149 Type 2 diabetes mellitus with other diabetic neurological complication: Secondary | ICD-10-CM

## 2019-06-12 DIAGNOSIS — N289 Disorder of kidney and ureter, unspecified: Secondary | ICD-10-CM

## 2019-06-12 MED ORDER — AMLODIPINE BESYLATE 5 MG PO TABS: 2.5000 mg | ORAL_TABLET | Freq: Every day | ORAL | 1 refills | Status: DC
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
## 2019-06-12 NOTE — Assessment & Plan Note (Signed)
Is s/p amputation after having sudden onset of occlusion. He had to have vascular stenting and his foot is perfusing well now. He is following with Dr Jacqualyn Posey of podiatry as he heals up. He reports he is doing well. No fevers or chills or systemic symptoms.

## 2019-06-12 NOTE — Telephone Encounter (Signed)
Faxed required form with orders, clinicals and demographics.

## 2019-06-12 NOTE — Assessment & Plan Note (Signed)
Blood pressures have been running on the low side. Some systolics below 833 and many diastolics around 50 with pulse usually in the 60s will try dropping the Amlodipine from 5 to 2.5 mg daily.

## 2019-06-12 NOTE — Telephone Encounter (Signed)
I would like to start ASAP. Since it is not set up then he should come in Monday for a dressing change. If the wife is comfortable changing it then she could change it today. She can apply a small amount of betadine to the incision followed by a dressing. Otherwise we will do it Monday morning. If he can come in this afternoon before 3:30 I will be happy to do it today.

## 2019-06-12 NOTE — Telephone Encounter (Signed)
[name] - [facility] states they are unable to accept pt his insurance is out of net work, and she tried [facility] they are not able to staff for pt.

## 2019-06-12 NOTE — Telephone Encounter (Signed)
I told pt that the [facility] we had referred to did not have staffing for his care and I was still looking for an agency.

## 2019-06-12 NOTE — Telephone Encounter (Signed)
I called pt's home and spoke with pt's wife, [name], and informed of Dr. Leigh Aurora recommendations. [name] states she feels comfortable performing the dressing changes and will get the betadine and dressing and take care of the surgery site. I told [name] then it would be fine to keep the Tuesday appt.

## 2019-06-12 NOTE — Telephone Encounter (Signed)
Pt had amputation on 05/29/19 and is supposed to have a home health nurse coming to change his bandages. Pt has not heard anything from the Home health facility and is calling to follow up.

## 2019-06-12 NOTE — Assessment & Plan Note (Signed)
Hydrate and monitor 

## 2019-06-12 NOTE — Telephone Encounter (Signed)
That sounds great.  Thank you

## 2019-06-12 NOTE — Assessment & Plan Note (Signed)
hgba1c acceptable, minimize simple carbs. Increase exercise as tolerated. Continue current meds 

## 2019-06-12 NOTE — Telephone Encounter (Signed)
[facility] states they do not cover [place].

## 2019-06-12 NOTE — Telephone Encounter (Signed)
I called [facility], Ronalee Belts states they accept [employer], but will have to see if they have staffing in the [place] area. Robert Bellow states the nurse for that area is on vacation, try [facility].

## 2019-06-12 NOTE — Assessment & Plan Note (Signed)
Tolerating statin, encouraged heart healthy diet, avoid trans fats, minimize simple carbs and saturated fats. Increase exercise as tolerated 

## 2019-06-15 DIAGNOSIS — M25511 Pain in right shoulder: Secondary | ICD-10-CM | POA: Diagnosis not present

## 2019-06-15 NOTE — Telephone Encounter (Signed)
[facility] states [facility] staff member stated they do not have staffing for pt's home area.

## 2019-06-16 ENCOUNTER — Encounter (INDEPENDENT_AMBULATORY_CARE_PROVIDER_SITE_OTHER): Payer: Self-pay | Admitting: Bariatrics

## 2019-06-16 ENCOUNTER — Ambulatory Visit: Payer: Medicare Other | Admitting: Orthotics

## 2019-06-16 ENCOUNTER — Telehealth (INDEPENDENT_AMBULATORY_CARE_PROVIDER_SITE_OTHER): Payer: Medicare Other | Admitting: Bariatrics

## 2019-06-16 ENCOUNTER — Encounter: Payer: Self-pay | Admitting: Podiatry

## 2019-06-16 ENCOUNTER — Other Ambulatory Visit: Payer: Self-pay

## 2019-06-16 ENCOUNTER — Ambulatory Visit (INDEPENDENT_AMBULATORY_CARE_PROVIDER_SITE_OTHER): Payer: Self-pay | Admitting: Podiatry

## 2019-06-16 VITALS — Temp 97.8°F

## 2019-06-16 DIAGNOSIS — Z9889 Other specified postprocedural states: Secondary | ICD-10-CM

## 2019-06-16 DIAGNOSIS — E1149 Type 2 diabetes mellitus with other diabetic neurological complication: Secondary | ICD-10-CM

## 2019-06-16 DIAGNOSIS — I10 Essential (primary) hypertension: Secondary | ICD-10-CM | POA: Diagnosis not present

## 2019-06-16 DIAGNOSIS — Z6834 Body mass index (BMI) 34.0-34.9, adult: Secondary | ICD-10-CM | POA: Diagnosis not present

## 2019-06-16 DIAGNOSIS — E669 Obesity, unspecified: Secondary | ICD-10-CM

## 2019-06-16 DIAGNOSIS — M869 Osteomyelitis, unspecified: Secondary | ICD-10-CM

## 2019-06-16 NOTE — Telephone Encounter (Signed)
As long as the wife is OK with changing the dressing it should be OK. I will discuss at his appointment.
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
## 2019-06-17 NOTE — Progress Notes (Signed)
Subjective: [name] is a 73 y.o. is seen today in office s/p left hallux amputation preformed on 05/29/2019.  His wife to change the bandage and the area is been doing well.  Denies any pain denies any redness or swelling.  He has been more active on his foot.  He has been wearing the cam boot however. Denies any systemic complaints such as fevers, chills, nausea, vomiting. No calf pain, chest pain, shortness of breath.   Objective: General: No acute distress, AAOx3  DP/PT pulses palpable 2/4, CRT < 3 sec to all digits.  LEFT foot: Incision is well coapted without any evidence of dehiscence.  There is decreased edema to the area there is no surrounding erythema there is no ascending cellulitis.  No drainage no fluctuation crepitation any malodor.  No pain with calf compression, swelling, warmth, erythema.   Assessment and Plan:  Status post left hallux amputation, doing well  -Treatment options discussed including all alternatives, risks, and complications -Incision appears to be healing well.  Finish course of Augmentin.  There is still some mild motion across the incision as well as the sutures intact.  Small meta Betadine was applied followed by dressing.  His wife needed every other day dressing changes.  Continue to limit weightbearing with the cam boot.  Elevation.   Return in about 1 week (around 06/23/2019).  Possible suture removal.  [name]

## 2019-06-18 ENCOUNTER — Other Ambulatory Visit: Payer: Self-pay

## 2019-06-18 DIAGNOSIS — I70229 Atherosclerosis of native arteries of extremities with rest pain, unspecified extremity: Secondary | ICD-10-CM

## 2019-06-18 DIAGNOSIS — I771 Stricture of artery: Secondary | ICD-10-CM

## 2019-06-18 DIAGNOSIS — I998 Other disorder of circulatory system: Secondary | ICD-10-CM

## 2019-06-23 ENCOUNTER — Telehealth: Payer: Self-pay | Admitting: *Deleted

## 2019-06-23 ENCOUNTER — Ambulatory Visit (INDEPENDENT_AMBULATORY_CARE_PROVIDER_SITE_OTHER): Payer: Medicare Other | Admitting: Podiatry

## 2019-06-23 ENCOUNTER — Encounter: Payer: Self-pay | Admitting: Podiatry

## 2019-06-23 ENCOUNTER — Other Ambulatory Visit: Payer: Self-pay

## 2019-06-23 ENCOUNTER — Telehealth: Payer: Self-pay | Admitting: Podiatry

## 2019-06-23 VITALS — Temp 98.1°F

## 2019-06-23 DIAGNOSIS — M869 Osteomyelitis, unspecified: Secondary | ICD-10-CM

## 2019-06-23 DIAGNOSIS — L539 Erythematous condition, unspecified: Secondary | ICD-10-CM

## 2019-06-23 DIAGNOSIS — Z9889 Other specified postprocedural states: Secondary | ICD-10-CM

## 2019-06-23 MED ORDER — CEPHALEXIN 500 MG PO CAPS: 500.0000 mg | ORAL_CAPSULE | Freq: Three times a day (TID) | ORAL | 0 refills | Status: DC

## 2019-06-23 NOTE — Telephone Encounter (Signed)
Pt's wife, [name] states pt's antibiotic was not sent to the pharmacy.

## 2019-06-23 NOTE — Telephone Encounter (Signed)
Pt wife called and wanted to know if her husband anti-biotic could be sent to the pharmacy and also she had a few questions about the type of shoe he is suppose to wear.

## 2019-06-23 NOTE — Telephone Encounter (Signed)
Lattie Haw called and left a voicemail for her.

## 2019-06-23 NOTE — Progress Notes (Signed)
   Subjective: [name] is a 73 y.o. is seen today in office s/p left hallux amputation preformed on 05/29/2019. His wife has been changing the bandages daily.  Patient had been feeling well however is noticed some blister, scab at the distal aspect of the second toe still some swelling or redness of the second toe.  He is still wearing his surgical boot.Denies any systemic complaints such as fevers, chills, nausea, vomiting. No calf pain, chest pain, shortness of breath.   Objective: General: No acute distress, AAOx3  DP/PT pulses palpable 2/4, CRT < 3 sec to all digits.  LEFT foot: Incision is well coapted without any evidence of dehiscence.  Sutures intact.  There is minimal edema there is no surrounding erythema or warmth.  Hammertoe contracture present second toe with hyperkeratotic lesion of the distal aspect the toe with localized edema and erythema to the distal toe.  No increase in warmth.  No fluctuation or crepitation any malodor.    No pain with calf compression, swelling, warmth, erythema.   Assessment and Plan:  Status post left hallux amputation, left second toe erythema  -Treatment options discussed including all alternatives, risks, and complications -Sutures removed without complications.  Incision appears to be doing well.  We will concern about the second toe today.  I debrided some loose tissue at the distal aspect and complications.  Prescribed Keflex.  On return back to wearing the Darco wedge shoe. -Monitor for any clinical signs or symptoms of infection and directed to call the office immediately should any occur or go to the ER.  Return in about 1 week (around 06/30/2019).  [name]

## 2019-06-23 NOTE — Telephone Encounter (Signed)
Per Dr Jacqualyn Posey is sending the antibiotics over right now and the shoe that Dr Jacqualyn Posey wanted the patient to wear is the wedge darco shoe and I left a message for the patient to call me back at [phone]. Lattie Haw

## 2019-06-26 ENCOUNTER — Telehealth: Payer: Self-pay | Admitting: *Deleted

## 2019-06-26 NOTE — Telephone Encounter (Signed)
Please call and set up lab only appointment per [name].  Lab orders are in.  (wife Sammie Akkerman will have phone message to set up a lab appointment as well)

## 2019-06-29 ENCOUNTER — Other Ambulatory Visit: Payer: Self-pay | Admitting: Family Medicine

## 2019-06-30 ENCOUNTER — Encounter (INDEPENDENT_AMBULATORY_CARE_PROVIDER_SITE_OTHER): Payer: Self-pay | Admitting: Bariatrics

## 2019-06-30 ENCOUNTER — Ambulatory Visit (INDEPENDENT_AMBULATORY_CARE_PROVIDER_SITE_OTHER): Payer: Medicare Other | Admitting: Vascular Surgery

## 2019-06-30 ENCOUNTER — Ambulatory Visit (INDEPENDENT_AMBULATORY_CARE_PROVIDER_SITE_OTHER)
Admission: RE | Admit: 2019-06-30 | Discharge: 2019-06-30 | Disposition: A | Discharge status: Home / Self Care | Payer: Medicare Other | Source: Ambulatory Visit | Attending: Vascular Surgery | Admitting: Vascular Surgery

## 2019-06-30 ENCOUNTER — Other Ambulatory Visit: Payer: Self-pay

## 2019-06-30 ENCOUNTER — Telehealth (INDEPENDENT_AMBULATORY_CARE_PROVIDER_SITE_OTHER): Payer: Medicare Other | Admitting: Bariatrics

## 2019-06-30 ENCOUNTER — Ambulatory Visit (HOSPITAL_COMMUNITY)
Admission: RE | Admit: 2019-06-30 | Discharge: 2019-06-30 | Disposition: A | Discharge status: Home / Self Care | Payer: Medicare Other | Source: Ambulatory Visit | Attending: Vascular Surgery | Admitting: Vascular Surgery

## 2019-06-30 ENCOUNTER — Encounter: Payer: Self-pay | Admitting: Vascular Surgery

## 2019-06-30 VITALS — BP 132/78 | HR 67 | Temp 97.2°F | Resp 16 | Ht 69.0 in | Wt 213.0 lb

## 2019-06-30 DIAGNOSIS — Z6834 Body mass index (BMI) 34.0-34.9, adult: Secondary | ICD-10-CM

## 2019-06-30 DIAGNOSIS — F3289 Other specified depressive episodes: Secondary | ICD-10-CM | POA: Diagnosis not present

## 2019-06-30 DIAGNOSIS — E1149 Type 2 diabetes mellitus with other diabetic neurological complication: Secondary | ICD-10-CM

## 2019-06-30 DIAGNOSIS — I771 Stricture of artery: Secondary | ICD-10-CM | POA: Diagnosis not present

## 2019-06-30 DIAGNOSIS — I998 Other disorder of circulatory system: Secondary | ICD-10-CM

## 2019-06-30 DIAGNOSIS — I70229 Atherosclerosis of native arteries of extremities with rest pain, unspecified extremity: Secondary | ICD-10-CM

## 2019-06-30 DIAGNOSIS — E669 Obesity, unspecified: Secondary | ICD-10-CM | POA: Diagnosis not present

## 2019-06-30 MED ORDER — BUPROPION HCL ER (SR) 200 MG PO TB12: 200.0000 mg | ORAL_TABLET | Freq: Every day | ORAL | 0 refills | Status: DC
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
## 2019-07-01 ENCOUNTER — Other Ambulatory Visit (INDEPENDENT_AMBULATORY_CARE_PROVIDER_SITE_OTHER): Payer: Medicare Other

## 2019-07-01 DIAGNOSIS — E7849 Other hyperlipidemia: Secondary | ICD-10-CM | POA: Diagnosis not present

## 2019-07-01 DIAGNOSIS — I10 Essential (primary) hypertension: Secondary | ICD-10-CM | POA: Diagnosis not present

## 2019-07-01 DIAGNOSIS — E1149 Type 2 diabetes mellitus with other diabetic neurological complication: Secondary | ICD-10-CM

## 2019-07-01 LAB — CBC
HCT: 43 % (ref 39.0–52.0)
Hemoglobin: 14.4 g/dL (ref 13.0–17.0)
MCHC: 33.6 g/dL (ref 30.0–36.0)
MCV: 92.9 fl (ref 78.0–100.0)
Platelets: 195 10*3/uL (ref 150.0–400.0)
RBC: 4.63 Mil/uL (ref 4.22–5.81)
RDW: 14.5 % (ref 11.5–15.5)
WBC: 6.6 10*3/uL (ref 4.0–10.5)

## 2019-07-01 LAB — COMPREHENSIVE METABOLIC PANEL
ALT: 38 U/L (ref 0–53)
AST: 21 U/L (ref 0–37)
Albumin: 4.3 g/dL (ref 3.5–5.2)
Alkaline Phosphatase: 86 U/L (ref 39–117)
BUN: 26 mg/dL — ABNORMAL HIGH (ref 6–23)
CO2: 29 mEq/L (ref 19–32)
Calcium: 9.3 mg/dL (ref 8.4–10.5)
Chloride: 102 mEq/L (ref 96–112)
Creatinine, Ser: 0.85 mg/dL (ref 0.40–1.50)
GFR: 88.63 mL/min (ref >60.00)
Glucose, Bld: 124 mg/dL — ABNORMAL HIGH (ref 70–99)
Potassium: 4.9 mEq/L (ref 3.5–5.1)
Sodium: 139 mEq/L (ref 135–145)
Total Bilirubin: 0.3 mg/dL (ref 0.2–1.2)
Total Protein: 6.9 g/dL (ref 6.0–8.3)

## 2019-07-01 LAB — TSH: TSH: 3.93 u[IU]/mL (ref 0.35–4.50)

## 2019-07-01 LAB — LIPID PANEL
Cholesterol: 129 mg/dL (ref 0–200)
HDL: 31.2 mg/dL — ABNORMAL LOW (ref >39.00)
LDL Cholesterol: 62 mg/dL (ref 0–99)
NonHDL: 97.63
Total CHOL/HDL Ratio: 4
Triglycerides: 178 mg/dL — ABNORMAL HIGH (ref 0.0–149.0)
VLDL: 35.6 mg/dL (ref 0.0–40.0)

## 2019-07-01 LAB — HEMOGLOBIN A1C: Hgb A1c MFr Bld: 5.6 % (ref 4.6–6.5)

## 2019-07-02 ENCOUNTER — Encounter: Payer: Self-pay | Admitting: Podiatry

## 2019-07-02 ENCOUNTER — Ambulatory Visit (INDEPENDENT_AMBULATORY_CARE_PROVIDER_SITE_OTHER): Payer: Self-pay | Admitting: Podiatry

## 2019-07-02 ENCOUNTER — Other Ambulatory Visit: Payer: Self-pay

## 2019-07-02 DIAGNOSIS — L539 Erythematous condition, unspecified: Secondary | ICD-10-CM

## 2019-07-02 DIAGNOSIS — M869 Osteomyelitis, unspecified: Secondary | ICD-10-CM

## 2019-07-02 DIAGNOSIS — Z9889 Other specified postprocedural states: Secondary | ICD-10-CM

## 2019-07-02 MED ORDER — MUPIROCIN 2 % EX OINT: 1.0000 "application " | TOPICAL_OINTMENT | Freq: Two times a day (BID) | CUTANEOUS | 2 refills | Status: DC
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
## 2019-07-08 NOTE — Progress Notes (Signed)
Subjective: [name] is a 73 y.o. is seen today in office s/p left hallux amputation preformed on 05/29/2019.  Overall he states he is doing much better.  He is eager to get back into a regular shoe and start to do more activities.  He says the redness on the second toe is been doing much better.  He is on antibiotics. Denies any systemic complaints such as fevers, chills, nausea, vomiting. No calf pain, chest pain, shortness of breath.   Objective: General: No acute distress, AAOx3  DP/PT pulses palpable 2/4, CRT < 3 sec to all digits.  LEFT foot: Incision is well coapted without any evidence of dehiscence.  The scar is formed.  No pain noted.  No edema, erythema.  Significant improved erythema to the second toe and there was some thick callused dried blood area under the tip of the toe on the second.  Upon debridement a small superficial wound is present.  There is no probing, undermining or tunneling.  No fluctuation crepitation.  Hammertoe contractures present which is rigid.  No other open lesions or pre-ulcerative lesions.  No pain with calf compression, swelling, warmth, erythema.   Assessment and Plan:  Status post left hallux amputation, resolving left second toe erythema  -Treatment options discussed including all alternatives, risks, and complications -Debrided the hyperkeratotic tissue to the distal aspect left second toe.  Superficial wounds present.  Symptoms improved erythema but still remaining somewhat distally.  Continue antibiotics and refilled Keflex.  Continue daily dressing changes.  Monitoring signs or symptoms of infection.  I will see him back in 1 week or sooner if any issues are to arise.  Return in about 1 week (around 07/09/2019).  [name]

## 2019-07-09 ENCOUNTER — Other Ambulatory Visit: Payer: Self-pay

## 2019-07-09 ENCOUNTER — Ambulatory Visit (INDEPENDENT_AMBULATORY_CARE_PROVIDER_SITE_OTHER): Payer: Medicare Other | Admitting: Podiatry

## 2019-07-09 DIAGNOSIS — M869 Osteomyelitis, unspecified: Secondary | ICD-10-CM

## 2019-07-09 DIAGNOSIS — Z9889 Other specified postprocedural states: Secondary | ICD-10-CM

## 2019-07-14 ENCOUNTER — Other Ambulatory Visit: Payer: Self-pay

## 2019-07-14 ENCOUNTER — Ambulatory Visit (INDEPENDENT_AMBULATORY_CARE_PROVIDER_SITE_OTHER): Payer: Medicare Other | Admitting: Orthotics

## 2019-07-14 DIAGNOSIS — E1142 Type 2 diabetes mellitus with diabetic polyneuropathy: Secondary | ICD-10-CM

## 2019-07-14 DIAGNOSIS — L84 Corns and callosities: Secondary | ICD-10-CM | POA: Diagnosis not present

## 2019-07-14 DIAGNOSIS — M216X2 Other acquired deformities of left foot: Secondary | ICD-10-CM | POA: Diagnosis not present

## 2019-07-14 DIAGNOSIS — Z9889 Other specified postprocedural states: Secondary | ICD-10-CM

## 2019-07-14 DIAGNOSIS — M869 Osteomyelitis, unspecified: Secondary | ICD-10-CM

## 2019-07-14 DIAGNOSIS — S98132A Complete traumatic amputation of one left lesser toe, initial encounter: Secondary | ICD-10-CM

## 2019-07-14 NOTE — Progress Notes (Signed)
Patient came in today to pick up diabetic shoes and custom inserts. LEFT hallux toe filler.   Same was well pleased with fit and function.   The patient could ambulate without any discomfort; there were no signs of any quality issues. The foot ortheses offered full contact with plantar surface and contoured the arch well.   The shoes fit well with no heel slippage and areas of pressure concern.   Patient advised to contact us if any problems arise.  Patient also advised on how to report any issues.

## 2019-07-15 NOTE — Progress Notes (Signed)
Subjective: [name] is a 73 y.o. is seen today in office s/p left hallux amputation preformed on 05/29/2019.  Overall he states that he is doing well and the amputation site is healed very nicely.  The second toe is doing well denies any drainage or pus and there is been substantially improved edema erythema to the toe.  He states he still gets some swelling to the right foot.  No redness or warmth.  He is no longer on any antibiotics. Denies any systemic complaints such as fevers, chills, nausea, vomiting. No calf pain, chest pain, shortness of breath.   Objective: General: No acute distress, AAOx3  DP/PT pulses palpable 2/4, CRT < 3 sec to all digits.  LEFT foot: Incision is well coapted without any evidence of dehiscen and a scar is formed overlying the amputation site.  The distal aspect of the second toes are hyperkeratotic tissue as well as old dried blister.  The wound is almost completely healed.  There is minimal edema to the toe there is no significant fluctuation crepitation or any ascending cellulitis.  Minimal erythema at the distal aspect.  Overall much improved. No pain with calf compression, swelling, warmth, erythema.   Assessment and Plan:  Status post left hallux amputation, resolving left second toe erythema  -Treatment options discussed including all alternatives, risks, and complications -Early debrided some bullous hyperkeratotic tissue in the left second toe today with any complications.  Continue with daily dressing changes.  Encouraged elevation but the residual swelling.  He is going to see Liliane Channel next week for toe filler.  Hopefully can offload second toes well and start getting back into a regular shoe.  Hold off on getting the area wet for now as there is still a small wound still evident on the second toe.  Return in about 2 weeks (around 07/23/2019).  [name]

## 2019-07-16 ENCOUNTER — Ambulatory Visit (INDEPENDENT_AMBULATORY_CARE_PROVIDER_SITE_OTHER): Payer: Medicare Other

## 2019-07-16 ENCOUNTER — Other Ambulatory Visit: Payer: Self-pay

## 2019-07-16 DIAGNOSIS — Z23 Encounter for immunization: Secondary | ICD-10-CM

## 2019-07-16 NOTE — Progress Notes (Signed)
Pre visit review using our clinic review tool, if applicable. No additional management support is needed unless otherwise documented below in the visit note. Patient here today for flu vaccine. 0.34mL high dose flu vaccine given in left deltoid IM. Patient tolerated well.

## 2019-07-20 DIAGNOSIS — D229 Melanocytic nevi, unspecified: Secondary | ICD-10-CM | POA: Diagnosis not present

## 2019-07-20 DIAGNOSIS — L409 Psoriasis, unspecified: Secondary | ICD-10-CM | POA: Diagnosis not present

## 2019-07-20 DIAGNOSIS — L570 Actinic keratosis: Secondary | ICD-10-CM | POA: Diagnosis not present

## 2019-07-21 ENCOUNTER — Encounter (INDEPENDENT_AMBULATORY_CARE_PROVIDER_SITE_OTHER): Payer: Self-pay | Admitting: Bariatrics

## 2019-07-21 ENCOUNTER — Other Ambulatory Visit: Payer: Self-pay

## 2019-07-21 ENCOUNTER — Telehealth (INDEPENDENT_AMBULATORY_CARE_PROVIDER_SITE_OTHER): Payer: Medicare Other | Admitting: Bariatrics

## 2019-07-21 DIAGNOSIS — E119 Type 2 diabetes mellitus without complications: Secondary | ICD-10-CM | POA: Diagnosis not present

## 2019-07-21 DIAGNOSIS — Z6831 Body mass index (BMI) 31.0-31.9, adult: Secondary | ICD-10-CM | POA: Diagnosis not present

## 2019-07-21 DIAGNOSIS — F3289 Other specified depressive episodes: Secondary | ICD-10-CM | POA: Diagnosis not present

## 2019-07-21 DIAGNOSIS — E669 Obesity, unspecified: Secondary | ICD-10-CM

## 2019-07-21 MED ORDER — BUPROPION HCL ER (SR) 200 MG PO TB12: 200.0000 mg | ORAL_TABLET | Freq: Every day | ORAL | 0 refills | Status: DC

## 2019-07-23 ENCOUNTER — Other Ambulatory Visit: Payer: Self-pay

## 2019-07-23 ENCOUNTER — Ambulatory Visit (INDEPENDENT_AMBULATORY_CARE_PROVIDER_SITE_OTHER): Payer: Medicare Other | Admitting: Podiatry

## 2019-07-23 DIAGNOSIS — M869 Osteomyelitis, unspecified: Secondary | ICD-10-CM

## 2019-07-23 DIAGNOSIS — Z9889 Other specified postprocedural states: Secondary | ICD-10-CM
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
## 2019-07-23 NOTE — Progress Notes (Signed)
Subjective: [name] is a 73 y.o. is seen today in office s/p left hallux amputation preformed on 05/29/2019.  States he is doing well.  He has slowly been wearing the diabetic shoe with insert and he is doing well.  Denies any new issues.  Some occasional swelling to the foot but overall improving.  No erythema or warmth.  He has no other concerns today. No redness or warmth.  He is no longer on any antibiotics. Denies any systemic complaints such as fevers, chills, nausea, vomiting. No calf pain, chest pain, shortness of breath.   Objective: General: No acute distress, AAOx3  DP/PT pulses palpable 2/4, CRT < 3 sec to all digits.  LEFT foot: Incision is well coapted without any evidence of dehiscence and scar is well formed at the amputation site.  Hammertoe contractures present a hyperkeratotic lesion of the distal aspect the left second toe without any underlying ulceration drainage or any signs of infection.  Mild edema to the foot there is no erythema or warmth or any signs of infection noted otherwise.   No pain with calf compression, swelling, warmth, erythema.   Assessment and Plan:  Status post left hallux amputation, resolving left second toe erythema  -Treatment options discussed including all alternatives, risks, and complications -Lightly debrided the hyperkeratotic tissue without any complications or bleeding on the left side.  Continue offloading.  Gradually continue to increase the amount of time he is wearing the diabetic shoe.  He presented today in a surgical shoe.  Elevation.  Discussed importance daily foot inspection.  Return in about 2 weeks (around 08/06/2019).  [name]

## 2019-07-24 DIAGNOSIS — H401131 Primary open-angle glaucoma, bilateral, mild stage: Secondary | ICD-10-CM | POA: Diagnosis not present

## 2019-07-28 ENCOUNTER — Other Ambulatory Visit: Payer: Self-pay

## 2019-07-28 ENCOUNTER — Ambulatory Visit: Payer: Medicare Other | Admitting: Orthotics

## 2019-07-28 DIAGNOSIS — Z9889 Other specified postprocedural states: Secondary | ICD-10-CM

## 2019-07-28 DIAGNOSIS — M869 Osteomyelitis, unspecified: Secondary | ICD-10-CM

## 2019-07-28 DIAGNOSIS — S98132A Complete traumatic amputation of one left lesser toe, initial encounter: Secondary | ICD-10-CM

## 2019-07-28 NOTE — Progress Notes (Signed)
Did toe filler for left foot; will bring back to finish second toe filler same foot.
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
## 2019-07-30 ENCOUNTER — Other Ambulatory Visit: Payer: Self-pay

## 2019-07-30 ENCOUNTER — Encounter: Payer: Self-pay | Admitting: *Deleted

## 2019-07-30 ENCOUNTER — Ambulatory Visit (INDEPENDENT_AMBULATORY_CARE_PROVIDER_SITE_OTHER): Payer: Medicare Other | Admitting: *Deleted

## 2019-07-30 DIAGNOSIS — Z0000 Encounter for general adult medical examination without abnormal findings: Secondary | ICD-10-CM | POA: Diagnosis not present

## 2019-07-30 NOTE — Patient Instructions (Signed)
Please schedule your next medicare wellness visit with me in 1 yr.  Continue to eat heart healthy diet (full of fruits, vegetables, whole grains, lean protein, water--limit salt, fat, and sugar intake) and increase physical activity as tolerated.  Continue doing brain stimulating activities (puzzles, reading, adult coloring books, staying active) to keep memory sharp.    [name] , Thank you for taking time to come for your Medicare Wellness Visit. I appreciate your ongoing commitment to your health goals. Please review the following plan we discussed and let me know if I can assist you in the future.   These are the goals we discussed: Goals    . Increase physical activity       This is a list of the screening recommended for you and due dates:  Health Maintenance  Topic Date Due  . Colon Cancer Screening  05/27/2016  . Stool Blood Test  08/05/2017  . Eye exam for diabetics  01/07/2018  . Hemoglobin A1C  01/01/2020  . Complete foot exam   01/20/2020  . Tetanus Vaccine  07/24/2028  . Flu Shot  Completed  .  Hepatitis C: One time screening is recommended by Center for Disease Control  (CDC) for  adults born from [birth year] through 1964.   Completed  . Pneumonia vaccines  Completed     Health Maintenance After Age 30 After age 94, you are at a higher risk for certain long-term diseases and infections as well as injuries from falls. Falls are a major cause of broken bones and head injuries in people who are older than age 52. Getting regular preventive care can help to keep you healthy and well. Preventive care includes getting regular testing and making lifestyle changes as recommended by your health care provider. Talk with your health care provider about:  Which screenings and tests you should have. A screening is a test that checks for a disease when you have no symptoms.  A diet and exercise plan that is right for you. What should I know about screenings and tests to prevent falls?  Screening and testing are the best ways to find a health problem early. Early diagnosis and treatment give you the best chance of managing medical conditions that are common after age 33. Certain conditions and lifestyle choices may make you more likely to have a fall. Your health care provider may recommend:  Regular vision checks. Poor vision and conditions such as cataracts can make you more likely to have a fall. If you wear glasses, make sure to get your prescription updated if your vision changes.  Medicine review. Work with your health care provider to regularly review all of the medicines you are taking, including over-the-counter medicines. Ask your health care provider about any side effects that may make you more likely to have a fall. Tell your health care provider if any medicines that you take make you feel dizzy or sleepy.  Osteoporosis screening. Osteoporosis is a condition that causes the bones to get weaker. This can make the bones weak and cause them to break more easily.  Blood pressure screening. Blood pressure changes and medicines to control blood pressure can make you feel dizzy.  Strength and balance checks. Your health care provider may recommend certain tests to check your strength and balance while standing, walking, or changing positions.  Foot health exam. Foot pain and numbness, as well as not wearing proper footwear, can make you more likely to have a fall.  Depression screening. You  may be more likely to have a fall if you have a fear of falling, feel emotionally low, or feel unable to do activities that you used to do.  Alcohol use screening. Using too much alcohol can affect your balance and may make you more likely to have a fall. What actions can I take to lower my risk of falls? General instructions  Talk with your health care provider about your risks for falling. Tell your health care provider if: ? You fall. Be sure to tell your health care provider  about all falls, even ones that seem minor. ? You feel dizzy, sleepy, or off-balance.  Take over-the-counter and prescription medicines only as told by your health care provider. These include any supplements.  Eat a healthy diet and maintain a healthy weight. A healthy diet includes low-fat dairy products, low-fat (lean) meats, and fiber from whole grains, beans, and lots of fruits and vegetables. Home safety  Remove any tripping hazards, such as rugs, cords, and clutter.  Install safety equipment such as grab bars in bathrooms and safety rails on stairs.  Keep rooms and walkways well-lit. Activity   Follow a regular exercise program to stay fit. This will help you maintain your balance. Ask your health care provider what types of exercise are appropriate for you.  If you need a cane or walker, use it as recommended by your health care provider.  Wear supportive shoes that have nonskid soles. Lifestyle  Do not drink alcohol if your health care provider tells you not to drink.  If you drink alcohol, limit how much you have: ? 0-1 drink a day for women. ? 0-2 drinks a day for men.  Be aware of how much alcohol is in your drink. In the [place] one drink equals one typical bottle of beer (12 oz), one-half glass of wine (5 oz), or one shot of hard liquor (1 oz).  Do not use any products that contain nicotine or tobacco, such as cigarettes and e-cigarettes. If you need help quitting, ask your health care provider. Summary  Having a healthy lifestyle and getting preventive care can help to protect your health and wellness after age 25.  Screening and testing are the best way to find a health problem early and help you avoid having a fall. Early diagnosis and treatment give you the best chance for managing medical conditions that are more common for people who are older than age 34.  Falls are a major cause of broken bones and head injuries in people who are older than age 82. Take  precautions to prevent a fall at home.  Work with your health care provider to learn what changes you can make to improve your health and wellness and to prevent falls. This information is not intended to replace advice given to you by your health care provider. Make sure you discuss any questions you have with your health care provider. Document Released: 09/04/2017 Document Revised: 02/12/2019 Document Reviewed: 09/04/2017 Elsevier Patient Education  2020 [employer].

## 2019-08-04 ENCOUNTER — Encounter (INDEPENDENT_AMBULATORY_CARE_PROVIDER_SITE_OTHER): Payer: Self-pay | Admitting: Bariatrics

## 2019-08-04 ENCOUNTER — Telehealth (INDEPENDENT_AMBULATORY_CARE_PROVIDER_SITE_OTHER): Payer: Medicare Other | Admitting: Bariatrics

## 2019-08-04 ENCOUNTER — Other Ambulatory Visit: Payer: Self-pay

## 2019-08-04 DIAGNOSIS — F3289 Other specified depressive episodes: Secondary | ICD-10-CM

## 2019-08-04 DIAGNOSIS — E119 Type 2 diabetes mellitus without complications: Secondary | ICD-10-CM

## 2019-08-04 DIAGNOSIS — E669 Obesity, unspecified: Secondary | ICD-10-CM

## 2019-08-04 DIAGNOSIS — Z6831 Body mass index (BMI) 31.0-31.9, adult: Secondary | ICD-10-CM

## 2019-08-04 MED ORDER — BUPROPION HCL ER (SR) 200 MG PO TB12: 200.0000 mg | ORAL_TABLET | Freq: Every day | ORAL | 0 refills | Status: DC
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
## 2019-08-06 ENCOUNTER — Other Ambulatory Visit: Payer: Self-pay

## 2019-08-06 ENCOUNTER — Ambulatory Visit (INDEPENDENT_AMBULATORY_CARE_PROVIDER_SITE_OTHER): Payer: Medicare Other | Admitting: Podiatry

## 2019-08-06 DIAGNOSIS — Z9889 Other specified postprocedural states: Secondary | ICD-10-CM

## 2019-08-06 DIAGNOSIS — M869 Osteomyelitis, unspecified: Secondary | ICD-10-CM

## 2019-08-06 DIAGNOSIS — E1142 Type 2 diabetes mellitus with diabetic polyneuropathy: Secondary | ICD-10-CM

## 2019-08-10 NOTE — Progress Notes (Signed)
Subjective: [name] is a 73 y.o. is seen today in office s/p left hallux amputation preformed on 05/29/2019.  He states the surgical site is doing well and is well-healed.  Is been back to wearing a regular shoe but he feels his regular top of his toes with the insert.  The second has been doing well and denies any new ulcerations. Denies any systemic complaints such as fevers, chills, nausea, vomiting. No calf pain, chest pain, shortness of breath.   Objective: General: No acute distress, AAOx3  DP/PT pulses palpable 2/4, CRT < 3 sec to all digits.  LEFT foot: Incision is well coapted without any evidence of dehiscence and scar is well formed at the amputation site.  Hammertoe contractures present a hyperkeratotic lesion of the distal aspect the left second toe without any underlying ulceration drainage or any signs of infection.  With the foot there is no erythema or warmth. Dried blood under the toenails. No pain with calf compression, swelling, warmth, erythema.   Assessment and Plan:  Status post left hallux amputation, hammertoe deformity  -Treatment options discussed including all alternatives, risks, and complications -Today I had Rick evaluate his insert and distal portion of the insert was grinded down in order to help take pressure off the toes while wearing the insert.  There was some dried blood underneath toenails I debrided the nail still with any complications or bleeding.  No signs of infection but monitor closely. -Compression anklet.   -Discussed importance daily foot inspection.  Return in about 3 weeks (around 08/27/2019).  [name]

## 2019-08-18 ENCOUNTER — Encounter (INDEPENDENT_AMBULATORY_CARE_PROVIDER_SITE_OTHER): Payer: Self-pay | Admitting: Bariatrics

## 2019-08-18 ENCOUNTER — Other Ambulatory Visit: Payer: Self-pay

## 2019-08-18 ENCOUNTER — Telehealth (INDEPENDENT_AMBULATORY_CARE_PROVIDER_SITE_OTHER): Payer: Medicare Other | Admitting: Bariatrics

## 2019-08-18 DIAGNOSIS — E669 Obesity, unspecified: Secondary | ICD-10-CM

## 2019-08-18 DIAGNOSIS — Z6831 Body mass index (BMI) 31.0-31.9, adult: Secondary | ICD-10-CM | POA: Diagnosis not present

## 2019-08-18 DIAGNOSIS — E66811 Obesity, class 1: Secondary | ICD-10-CM

## 2019-08-18 DIAGNOSIS — F3289 Other specified depressive episodes: Secondary | ICD-10-CM

## 2019-08-18 DIAGNOSIS — E119 Type 2 diabetes mellitus without complications: Secondary | ICD-10-CM | POA: Diagnosis not present

## 2019-08-18 MED ORDER — BUPROPION HCL ER (SR) 200 MG PO TB12: 200.0000 mg | ORAL_TABLET | Freq: Every day | ORAL | 0 refills | Status: DC
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
## 2019-08-22 ENCOUNTER — Other Ambulatory Visit: Payer: Self-pay | Admitting: Family Medicine

## 2019-08-27 ENCOUNTER — Ambulatory Visit: Payer: Medicare Other | Admitting: Podiatry

## 2019-08-27 ENCOUNTER — Ambulatory Visit (INDEPENDENT_AMBULATORY_CARE_PROVIDER_SITE_OTHER): Payer: Medicare Other

## 2019-08-27 ENCOUNTER — Other Ambulatory Visit: Payer: Self-pay

## 2019-08-27 ENCOUNTER — Other Ambulatory Visit: Payer: Self-pay | Admitting: Podiatry

## 2019-08-27 DIAGNOSIS — M2042 Other hammer toe(s) (acquired), left foot: Secondary | ICD-10-CM

## 2019-08-27 DIAGNOSIS — M722 Plantar fascial fibromatosis: Secondary | ICD-10-CM

## 2019-08-27 DIAGNOSIS — M2041 Other hammer toe(s) (acquired), right foot: Secondary | ICD-10-CM | POA: Diagnosis not present

## 2019-08-27 DIAGNOSIS — M84375A Stress fracture, left foot, initial encounter for fracture: Secondary | ICD-10-CM | POA: Diagnosis not present

## 2019-08-27 DIAGNOSIS — M7750 Other enthesopathy of unspecified foot: Secondary | ICD-10-CM

## 2019-08-27 MED ORDER — DICLOFENAC SODIUM 1 % TD GEL: 2.0000 g | Freq: Four times a day (QID) | TRANSDERMAL | 2 refills | Status: DC

## 2019-08-27 NOTE — Progress Notes (Signed)
Subjective: 73 year old male presents the office today for follow-up evaluation hammertoe and after undergoing hallux amputation left foot.  He states the foot feels fine.  He states he has been on his feet a lot recently has been working.  He is currently remodeling a house and he has a few jobs going on right now. Denies injury.  Still gets swelling to the second toe but no redness or warmth.  Over the last month he is also developing pain to the bottom of his right no recent injury or falls. Denies any systemic complaints such as fevers, chills, nausea, vomiting. No acute changes since last appointment, and no other complaints at this time.   Objective: AAO x3, NAD DP/PT pulses palpable bilaterally, CRT less than 3 seconds There is tenderness palpation of the plantar medial tubercle of the calcaneus at the insertion on the past on the right foot.  On the past appears to be intact.  No other pain on the right foot. The left foot amputation site is well-healed.  Hammertoe contractures present.  Small superficial wound of the distal aspect left second toe with no probing, undermining or tunneling.  There is no erythema there is no warmth.  No drainage or pus identified.  There is no change or concerning fluctuation crepitation. No pain with calf compression, swelling, warmth, erythema  Assessment: Superficial wound left second toe with stress fractures left foot; right foot plantar fasciitis  Plan: -All treatment options discussed with the patient including all alternatives, risks, complications.  -X-rays were obtained reviewed.  There is healing stress fractures present the left second tarsal neck as well as the left third digit.  On the right side inferior calcaneal spurs present. -Regards to the left foot he has been on his foot a lot more recently likely resulting in stress fractures.  Overall the swelling has improved pain is under compression ankle.  Discussed surgical shoe particularly if he  is to be on his feet a lot.  There is elevation.  We discussed a flexor tenotomy of the second toe.  He will consider this next appointment.  Given he is been on his foot recently this also contributed to diarrhea.  Irrigation second toe.  System offloading at all times.  He has not been keeping a bandage over and offloading pads. -Regards the right foot discussed traction, icing daily.  We discussed how to ice the foot.  Prescribed Voltaren gel to apply to be healed.  Continue supportive shoes. -Patient encouraged to call the office with any questions, concerns, change in symptoms.   Return in about 3 weeks (around 09/17/2019).   [name]

## 2019-08-27 NOTE — Patient Instructions (Signed)

## 2019-08-29 ENCOUNTER — Encounter: Payer: Self-pay | Admitting: Family Medicine

## 2019-08-31 MED ORDER — CLOPIDOGREL BISULFATE 75 MG PO TABS: 75.0000 mg | ORAL_TABLET | Freq: Every day | ORAL | 0 refills | Status: DC

## 2019-09-03 ENCOUNTER — Ambulatory Visit (INDEPENDENT_AMBULATORY_CARE_PROVIDER_SITE_OTHER): Payer: Medicare Other | Admitting: Bariatrics

## 2019-09-03 ENCOUNTER — Encounter (INDEPENDENT_AMBULATORY_CARE_PROVIDER_SITE_OTHER): Payer: Self-pay | Admitting: Bariatrics

## 2019-09-03 ENCOUNTER — Other Ambulatory Visit: Payer: Self-pay

## 2019-09-03 DIAGNOSIS — Z6834 Body mass index (BMI) 34.0-34.9, adult: Secondary | ICD-10-CM

## 2019-09-03 DIAGNOSIS — E669 Obesity, unspecified: Secondary | ICD-10-CM

## 2019-09-03 DIAGNOSIS — F418 Other specified anxiety disorders: Secondary | ICD-10-CM | POA: Diagnosis not present

## 2019-09-03 DIAGNOSIS — E119 Type 2 diabetes mellitus without complications: Secondary | ICD-10-CM | POA: Diagnosis not present

## 2019-09-03 MED ORDER — BUPROPION HCL ER (SR) 200 MG PO TB12: 200.0000 mg | ORAL_TABLET | Freq: Every day | ORAL | 0 refills | Status: DC

## 2019-09-03 MED ORDER — BUPROPION HCL ER (SR) 200 MG PO TB12: 200.0000 mg | ORAL_TABLET | Freq: Two times a day (BID) | ORAL | 0 refills | Status: DC
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
## 2019-09-04 DIAGNOSIS — H401131 Primary open-angle glaucoma, bilateral, mild stage: Secondary | ICD-10-CM | POA: Diagnosis not present

## 2019-09-17 ENCOUNTER — Ambulatory Visit (INDEPENDENT_AMBULATORY_CARE_PROVIDER_SITE_OTHER): Payer: Medicare Other

## 2019-09-17 ENCOUNTER — Ambulatory Visit: Payer: Medicare Other | Admitting: Podiatry

## 2019-09-17 ENCOUNTER — Other Ambulatory Visit: Payer: Self-pay

## 2019-09-17 ENCOUNTER — Encounter: Payer: Self-pay | Admitting: Podiatry

## 2019-09-17 DIAGNOSIS — M2042 Other hammer toe(s) (acquired), left foot: Secondary | ICD-10-CM

## 2019-09-17 DIAGNOSIS — M84375D Stress fracture, left foot, subsequent encounter for fracture with routine healing: Secondary | ICD-10-CM

## 2019-09-18 NOTE — Progress Notes (Signed)
Subjective: 73 year old male presents the office today for follow-up evaluation hammertoe and after undergoing hallux amputation left foot as well as for stress fractures.  He states he is doing much better.  Is not having any significant swelling he denies any redness or warmth.  Still has a callus to the tip of the toe but has had no drainage or redness or other issues. Denies any systemic complaints such as fevers, chills, nausea, vomiting. No acute changes since last appointment, and no other complaints at this time.   Objective: AAO x3, NAD DP/PT pulses palpable bilaterally, CRT less than 3 seconds At this time there is no tenderness to the forefoot.  Minimal swelling to the foot there is no erythema or warmth.  Hammertoe contractures are present most notably the second digit.  Hyperkeratotic tissue preulcerative at the distal aspect of the second toe.  There is no obvious signs of clinical infection today. No pain with calf compression, swelling, warmth, erythema  Assessment: Preulcerative area left second toe due to hammertoe deformity  Plan: -All treatment options discussed with the patient including all alternatives, risks, complications.  -X-rays obtained reviewed.  Stress fractures likely of the second third metatarsals, digits.  There is evidence of increased healing. -I sharply debrided some of his hyperkeratotic tissue on the second toe without any complications or bleeding.  Discussed with him flexor tenotomy but he wants to hold off on this since he is doing well.  Monitor for any signs or symptoms of infection.  Return in about 4 weeks (around 10/15/2019).  [name]

## 2019-09-19 ENCOUNTER — Other Ambulatory Visit: Payer: Self-pay | Admitting: Family Medicine

## 2019-09-21 MED ORDER — CLOPIDOGREL BISULFATE 75 MG PO TABS: 75.0000 mg | ORAL_TABLET | Freq: Every day | ORAL | 0 refills | Status: DC

## 2019-09-24 ENCOUNTER — Ambulatory Visit (INDEPENDENT_AMBULATORY_CARE_PROVIDER_SITE_OTHER): Payer: Medicare Other | Admitting: Bariatrics

## 2019-09-24 ENCOUNTER — Encounter (INDEPENDENT_AMBULATORY_CARE_PROVIDER_SITE_OTHER): Payer: Self-pay | Admitting: Bariatrics

## 2019-09-24 ENCOUNTER — Other Ambulatory Visit: Payer: Self-pay

## 2019-09-24 DIAGNOSIS — F3289 Other specified depressive episodes: Secondary | ICD-10-CM

## 2019-09-24 DIAGNOSIS — E669 Obesity, unspecified: Secondary | ICD-10-CM | POA: Diagnosis not present

## 2019-09-24 DIAGNOSIS — Z6831 Body mass index (BMI) 31.0-31.9, adult: Secondary | ICD-10-CM

## 2019-09-24 DIAGNOSIS — E119 Type 2 diabetes mellitus without complications: Secondary | ICD-10-CM

## 2019-09-24 MED ORDER — BUPROPION HCL ER (SR) 200 MG PO TB12: 200.0000 mg | ORAL_TABLET | Freq: Two times a day (BID) | ORAL | 0 refills | Status: DC

## 2019-09-28 ENCOUNTER — Encounter (INDEPENDENT_AMBULATORY_CARE_PROVIDER_SITE_OTHER): Payer: Self-pay | Admitting: Bariatrics

## 2019-09-28 ENCOUNTER — Other Ambulatory Visit: Payer: Self-pay | Admitting: Family Medicine
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
## 2019-10-14 ENCOUNTER — Other Ambulatory Visit: Payer: Self-pay

## 2019-10-14 ENCOUNTER — Encounter (INDEPENDENT_AMBULATORY_CARE_PROVIDER_SITE_OTHER): Payer: Self-pay | Admitting: Bariatrics

## 2019-10-14 ENCOUNTER — Telehealth (INDEPENDENT_AMBULATORY_CARE_PROVIDER_SITE_OTHER): Payer: Medicare Other | Admitting: Bariatrics

## 2019-10-14 DIAGNOSIS — Z6831 Body mass index (BMI) 31.0-31.9, adult: Secondary | ICD-10-CM | POA: Diagnosis not present

## 2019-10-14 DIAGNOSIS — F3289 Other specified depressive episodes: Secondary | ICD-10-CM

## 2019-10-14 DIAGNOSIS — E6609 Other obesity due to excess calories: Secondary | ICD-10-CM

## 2019-10-14 DIAGNOSIS — I10 Essential (primary) hypertension: Secondary | ICD-10-CM | POA: Diagnosis not present

## 2019-10-14 MED ORDER — BUPROPION HCL ER (SR) 200 MG PO TB12: 200.0000 mg | ORAL_TABLET | Freq: Two times a day (BID) | ORAL | 0 refills | Status: DC
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
## 2019-10-18 ENCOUNTER — Other Ambulatory Visit: Payer: Self-pay | Admitting: Family Medicine

## 2019-10-19 MED ORDER — CLOPIDOGREL BISULFATE 75 MG PO TABS: 75.0000 mg | ORAL_TABLET | Freq: Every day | ORAL | 2 refills | Status: DC

## 2019-10-19 NOTE — Telephone Encounter (Signed)
Dr Charlett Blake -- Clopidogrel refill sent. Pt last seen by you 06/2019 and has no future appts with you. When is pt due for follow up? He is currently seeing medical weight management.

## 2019-10-19 NOTE — Telephone Encounter (Signed)
He needs a follow up 6 months after last visit

## 2019-10-20 NOTE — Telephone Encounter (Signed)
Please schedule patient a visit in about 6 months  Please advise

## 2019-10-21 NOTE — Telephone Encounter (Signed)
Last OV 06/12/2019 - scheduled next OV for 12/08/2019

## 2019-10-22 ENCOUNTER — Encounter: Payer: Self-pay | Admitting: Podiatry

## 2019-10-22 ENCOUNTER — Other Ambulatory Visit: Payer: Self-pay

## 2019-10-22 ENCOUNTER — Encounter: Payer: Self-pay | Admitting: Family Medicine

## 2019-10-22 ENCOUNTER — Ambulatory Visit: Payer: Medicare Other | Admitting: Podiatry

## 2019-10-22 DIAGNOSIS — L84 Corns and callosities: Secondary | ICD-10-CM

## 2019-10-22 DIAGNOSIS — M2042 Other hammer toe(s) (acquired), left foot: Secondary | ICD-10-CM

## 2019-10-25 NOTE — Progress Notes (Signed)
Subjective: 73 year old male presents the office today for follow-up evaluation hammertoe and after undergoing hallux amputation left foot as well as for stress fractures.  Overall he states he has been doing well.  He is still wearing his regular shoes and being active he is in no increase in swelling.  He does state he would like to consider doing a flexor tenotomy on the left second toe.  He has had drainage previously but no drainage currently.  No swelling or redness of the second toe currently. Denies any systemic complaints such as fevers, chills, nausea, vomiting. No acute changes since last appointment, and no other complaints at this time.   Objective: AAO x3, NAD DP/PT pulses palpable bilaterally, CRT less than 3 seconds At this time there is no tenderness to the forefoot.  Incision from prior amputation site is well-healed.  Hyperkeratotic tissue the distal aspect left second toe which is preulcerative.  There is no drainage or pus identified today there is no edema, erythema to the toe.  No other open lesions are identified.  No pain with calf compression, swelling, warmth, erythema  Assessment: Preulcerative area left second toe due to hammertoe deformity  Plan: -All treatment options discussed with the patient including all alternatives, risks, complications.  -Overall he seems to be doing better.  We discussed doing a flexor tenotomy on the left second toe today we will switch to after the holidays to do this.  Discussed pros and cons of doing this as well as alternatives risks and complications.  We will schedule him after the holidays for the procedure.  For now continue offloading at all times.  I did debride some of the nails today but they were elongated without any complications or bleeding.  No follow-ups on file.  [name]

## 2019-10-26 MED ORDER — PREGABALIN 150 MG PO CAPS: 150.0000 mg | ORAL_CAPSULE | Freq: Three times a day (TID) | ORAL | 1 refills | Status: DC

## 2019-10-26 NOTE — Telephone Encounter (Signed)
Can you resend to [facility]

## 2019-10-27 ENCOUNTER — Encounter: Payer: Self-pay | Admitting: Family Medicine

## 2019-10-28 ENCOUNTER — Telehealth (INDEPENDENT_AMBULATORY_CARE_PROVIDER_SITE_OTHER): Payer: Medicare Other | Admitting: Bariatrics

## 2019-10-28 ENCOUNTER — Encounter (INDEPENDENT_AMBULATORY_CARE_PROVIDER_SITE_OTHER): Payer: Self-pay | Admitting: Bariatrics

## 2019-10-28 ENCOUNTER — Other Ambulatory Visit: Payer: Self-pay

## 2019-10-28 DIAGNOSIS — F3289 Other specified depressive episodes: Secondary | ICD-10-CM | POA: Diagnosis not present

## 2019-10-28 DIAGNOSIS — E119 Type 2 diabetes mellitus without complications: Secondary | ICD-10-CM
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
## 2019-11-09 ENCOUNTER — Ambulatory Visit: Payer: Medicare Other | Admitting: Podiatry

## 2019-11-09 ENCOUNTER — Other Ambulatory Visit: Payer: Self-pay

## 2019-11-09 ENCOUNTER — Encounter: Payer: Self-pay | Admitting: Podiatry

## 2019-11-09 VITALS — Temp 98.0°F

## 2019-11-09 DIAGNOSIS — M2042 Other hammer toe(s) (acquired), left foot: Secondary | ICD-10-CM | POA: Diagnosis not present

## 2019-11-09 DIAGNOSIS — L84 Corns and callosities: Secondary | ICD-10-CM | POA: Diagnosis not present

## 2019-11-09 MED ORDER — CEPHALEXIN 500 MG PO CAPS: 500.0000 mg | ORAL_CAPSULE | Freq: Three times a day (TID) | ORAL | 0 refills | Status: DC

## 2019-11-17 NOTE — Progress Notes (Signed)
Subjective: 74 year old male presents the office today for a flexor tenotomy on the left second toe given ongoing pressure to the distal aspect of the toe.  This has been resulted in hyperkeratotic tissue and he states that occasionally will open up.  And attempt to help prevent further infection we discussed flexor tenotomy or hammertoe repair.  After discussion he wants to go to proceed with a flexor tenotomy. Denies any systemic complaints such as fevers, chills, nausea, vomiting. No acute changes since last appointment, and no other complaints at this time.   Objective: AAO x3, NAD DP/PT pulses palpable bilaterally, CRT less than 3 seconds Amputation site is well-healed.  The distal aspect left second toe is a hyperkeratotic lesion with a small superficial granular wound at the distal aspect.  There is no drainage or pus identified today.  There is no fluctuation crepitation.  No open lesions or pre-ulcerative lesions.  No pain with calf compression, swelling, warmth, erythema  Assessment: Hammertoe deformity left second toe resulting in preulcerative area with superficial wound second toe distal aspect  Plan: -All treatment options discussed with the patient including all alternatives, risks, complications.  -We again discussed treatment options both conservative as well as surgical.  At this point wishes to proceed with flexor tenotomy.  Consent was signed after discussing alternatives, risks, complications. -Keflex  Procedure: Flexor Tenotomy Indication for Procedure: toe with semi-reducible hammertoe with distal tip ulceration. Flexor tenotomy indicated to alleviate contracture, reduce pressure, and enhance healing of the ulceration. Location: left, 2nd toe Anesthesia: 3 cc mix of lidocaine and marcaine plain digital block Instrumentation: 10-gauge needle Technique: The toe was anesthetized as above and prepped in the usual fashion.  The 18-gauge needle was inserted on the plantar  aspect of the toe along the course of the IPJ on the flexor tendon.  The flexor tendon was incised with noted release of the hammertoe deformity.  Areas cleaned with alcohol and hemostasis achieved.  Dressing applied.  There was found to be in immediate capillary fill time of the conclusion of the procedure. Patient tolerated the procedure well. Dressing: Dry, sterile, compression dressing. Disposition: Patient tolerated procedure well. Patient to return in 1 week for follow-up.  [name] DPM

## 2019-11-19 ENCOUNTER — Encounter: Payer: Self-pay | Admitting: Podiatry

## 2019-11-19 ENCOUNTER — Telehealth (INDEPENDENT_AMBULATORY_CARE_PROVIDER_SITE_OTHER): Payer: Medicare Other | Admitting: Bariatrics

## 2019-11-19 ENCOUNTER — Other Ambulatory Visit: Payer: Self-pay

## 2019-11-19 ENCOUNTER — Ambulatory Visit: Payer: Medicare Other | Admitting: Podiatry

## 2019-11-19 DIAGNOSIS — M2042 Other hammer toe(s) (acquired), left foot: Secondary | ICD-10-CM

## 2019-11-19 DIAGNOSIS — L84 Corns and callosities: Secondary | ICD-10-CM

## 2019-11-19 NOTE — Progress Notes (Signed)
Subjective: 74 year old male presents the office today for follow-up evaluation of flexor tenotomy left second toe.  He states that he is doing well and can tell the toe is straighter.  Still has a callus at the tip of the toe but denies any drainage or pus or any swelling.  He is on antibiotics without any side effects. Denies any systemic complaints such as fevers, chills, nausea, vomiting. No acute changes since last appointment, and no other complaints at this time.   Objective: AAO x3, NAD DP/PT pulses palpable bilaterally, CRT less than 3 seconds Post flexor tenotomy of the left second toe.  There is hyperkeratotic tissue with a superficial area at the distal aspect of the toe.  There is no drainage or pus there is no significant edema, erythema.  No other open lesions identified.  He is wearing a regular shoe. No open lesions or pre-ulcerative lesions.  No pain with calf compression, swelling, warmth, erythema      Assessment: Status post flexor tenotomy left second toe, superficial area of skin breakdown distal second toe Plan: -All treatment options discussed with the patient including all alternatives, risks, complications.  -He is doing well after having the flexor tenotomy.  Finish course of antibiotics.  I lightly debrided some of the loose hyperkeratotic tissue without any complications.  Continue antibiotic ointment dressing changes daily and offloading pads. -Monitor for any clinical signs or symptoms of infection and directed to call the office immediately should any occur or go to the ER. -Patient encouraged to call the office with any questions, concerns, change in symptoms.   Return in about 4 weeks (around 12/17/2019).

## 2019-11-24 ENCOUNTER — Encounter: Payer: Self-pay | Admitting: Family Medicine

## 2019-11-24 ENCOUNTER — Other Ambulatory Visit: Payer: Self-pay | Admitting: Family Medicine

## 2019-11-24 MED ORDER — PREGABALIN 150 MG PO CAPS: 150.0000 mg | ORAL_CAPSULE | Freq: Three times a day (TID) | ORAL | 1 refills | Status: DC

## 2019-11-24 NOTE — Telephone Encounter (Signed)
Requesting:lyrica Contract:no UDS:no Last OV:09/24/19 Next OV:12/08/19 Last Refill:10/26/19  #[id number] Database:   Please advise

## 2019-11-25 ENCOUNTER — Telehealth (INDEPENDENT_AMBULATORY_CARE_PROVIDER_SITE_OTHER): Payer: Medicare Other | Admitting: Bariatrics

## 2019-11-26 ENCOUNTER — Telehealth (INDEPENDENT_AMBULATORY_CARE_PROVIDER_SITE_OTHER): Payer: Medicare Other | Admitting: Bariatrics

## 2019-11-26 ENCOUNTER — Encounter (INDEPENDENT_AMBULATORY_CARE_PROVIDER_SITE_OTHER): Payer: Self-pay | Admitting: Bariatrics

## 2019-11-26 ENCOUNTER — Other Ambulatory Visit: Payer: Self-pay

## 2019-11-26 DIAGNOSIS — Z6831 Body mass index (BMI) 31.0-31.9, adult: Secondary | ICD-10-CM | POA: Diagnosis not present

## 2019-11-26 DIAGNOSIS — E1149 Type 2 diabetes mellitus with other diabetic neurological complication: Secondary | ICD-10-CM | POA: Diagnosis not present

## 2019-11-26 DIAGNOSIS — E6609 Other obesity due to excess calories: Secondary | ICD-10-CM | POA: Diagnosis not present

## 2019-11-26 DIAGNOSIS — F3289 Other specified depressive episodes: Secondary | ICD-10-CM | POA: Diagnosis not present

## 2019-11-26 MED ORDER — BUPROPION HCL ER (SR) 200 MG PO TB12: 200.0000 mg | ORAL_TABLET | Freq: Two times a day (BID) | ORAL | 0 refills | Status: DC

## 2019-11-27 DIAGNOSIS — U071 COVID-19: Secondary | ICD-10-CM | POA: Diagnosis not present

## 2019-11-29 ENCOUNTER — Encounter: Payer: Self-pay | Admitting: Family Medicine

## 2019-11-30 NOTE — Progress Notes (Signed)
TeleHealth Visit:  Due to the COVID-19 pandemic, this visit was completed with telemedicine (audio/video) technology to reduce patient and provider exposure as well as to preserve personal protective equipment.   [name] has verbally consented to this TeleHealth visit. The patient is located at home, the provider is located at the [employer] [facility] office. The participants in this visit include the listed provider and patient. The visit was conducted today via face time.   Chief Complaint: OBESITY [name] is here to discuss his progress with his obesity treatment plan along with follow-up of his obesity related diagnoses. [name] is on the Category 3 Plan and states he is following his eating plan approximately 70% of the time. [name] states he is doing 0 minutes 0 times per week.  Today's visit was #: 29 Starting weight: 236 lbs Starting date: 08/25/18  Interim History: Con states that his weight remains the same (218 lbs). He is getting in adequate water and protein.  Subjective:   1. Other depression,with emotional eating [name] states his Wellbutrin is helping with cravings, better than previously.  2. Diabetes mellitus type 2 with neurological manifestations ([name]) [name] is taking metformin in the morning. His fasting BGs range in 130's. He denies low blood sugars.  Assessment/Plan:   1. Other depression,with emotional eating Behavior modification techniques were discussed today to help [name] deal with his emotional/non-hunger eating behaviors. We will refill Wellbutrin for 1 month. Orders and follow up as documented in patient record.   - buPROPion (WELLBUTRIN SR) 200 MG 12 hr tablet; Take 1 tablet (200 mg total) by mouth 2 (two) times daily.  Dispense: 60 tablet; Refill: 0  2. Diabetes mellitus type 2 with neurological manifestations ([name]) Good blood sugar control is important to decrease the likelihood of diabetic complications such as nephropathy, neuropathy, limb  loss, blindness, coronary artery disease, and death. Intensive lifestyle modification including diet, exercise and weight loss are the first line of treatment for diabetes. [name] is to decrease simple carbohydrates, increase healthy protein and healthy fats.  3. Class 1 obesity due to excess calories with serious comorbidity and body mass index (BMI) of 31.0 to 31.9 in adult [name] is currently in the action stage of change. As such, his goal is to continue with weight loss efforts. He has agreed to the Category 3 Plan.   Exercise goals: [name] will be more active at home.  Behavioral modification strategies: increasing lean protein intake, decreasing simple carbohydrates, increasing vegetables, increasing water intake, decreasing eating out, no skipping meals, meal planning and cooking strategies, keeping healthy foods in the home and planning for success.  [name] has agreed to follow-up with our clinic in 2 weeks. He was informed of the importance of frequent follow-up visits to maximize his success with intensive lifestyle modifications for his multiple health conditions.  Objective:   VITALS: Per patient if applicable, see vitals. GENERAL: Alert and in no acute distress. CARDIOPULMONARY: No increased WOB. Speaking in clear sentences.  PSYCH: Pleasant and cooperative. Speech normal rate and rhythm. Affect is appropriate. Insight and judgement are appropriate. Attention is focused, linear, and appropriate.  NEURO: Oriented as arrived to appointment on time with no prompting.   Lab Results  Component Value Date   CREATININE 0.85 07/01/2019   BUN 26 (H) 07/01/2019   NA 139 07/01/2019   K 4.9 07/01/2019   CL 102 07/01/2019   CO2 29 07/01/2019   Lab Results  Component Value Date   ALT 38 07/01/2019  AST 21 07/01/2019   ALKPHOS 86 07/01/2019   BILITOT 0.3 07/01/2019   Lab Results  Component Value Date   HGBA1C 5.6 07/01/2019   HGBA1C 5.9 (H) 04/24/2019   HGBA1C 6.3 (H) 12/15/2018    HGBA1C 7.9 (H) 08/25/2018   HGBA1C 8.8 (H) 05/22/2018   Lab Results  Component Value Date   INSULIN 15.3 12/15/2018   INSULIN 29.0 (H) 08/25/2018   Lab Results  Component Value Date   TSH 3.93 07/01/2019   Lab Results  Component Value Date   CHOL 129 07/01/2019   HDL 31.20 (L) 07/01/2019   LDLCALC 62 07/01/2019   LDLDIRECT 104.9 12/12/2012   TRIG 178.0 (H) 07/01/2019   CHOLHDL 4 07/01/2019   Lab Results  Component Value Date   WBC 6.6 07/01/2019   HGB 14.4 07/01/2019   HCT 43.0 07/01/2019   MCV 92.9 07/01/2019   PLT 195.0 07/01/2019   No results found for: IRON, TIBC, FERRITIN  Attestation Statements:   Reviewed by clinician on day of visit: allergies, medications, problem list, medical history, surgical history, family history, social history, and previous encounter notes.   Wilhemena Durie, am acting as Location manager for [employer], DO.  I have reviewed the above documentation for accuracy and completeness, and I agree with the above. Jearld Lesch, DO

## 2019-12-02 DIAGNOSIS — M545 Low back pain: Secondary | ICD-10-CM | POA: Diagnosis not present

## 2019-12-04 ENCOUNTER — Encounter (HOSPITAL_COMMUNITY): Payer: Self-pay | Admitting: Internal Medicine

## 2019-12-04 ENCOUNTER — Inpatient Hospital Stay (HOSPITAL_COMMUNITY)
Admission: EM | Admit: 2019-12-04 | Discharge: 2019-12-08 | DRG: 177 | Disposition: A | Discharge status: Home / Self Care | Payer: Medicare Other | Attending: Internal Medicine | Admitting: Internal Medicine

## 2019-12-04 ENCOUNTER — Emergency Department (HOSPITAL_COMMUNITY): Payer: Medicare Other

## 2019-12-04 ENCOUNTER — Other Ambulatory Visit: Payer: Self-pay

## 2019-12-04 DIAGNOSIS — E669 Obesity, unspecified: Secondary | ICD-10-CM | POA: Diagnosis present

## 2019-12-04 DIAGNOSIS — N179 Acute kidney failure, unspecified: Secondary | ICD-10-CM | POA: Diagnosis present

## 2019-12-04 DIAGNOSIS — R0602 Shortness of breath: Secondary | ICD-10-CM | POA: Diagnosis not present

## 2019-12-04 DIAGNOSIS — I10 Essential (primary) hypertension: Secondary | ICD-10-CM | POA: Diagnosis not present

## 2019-12-04 DIAGNOSIS — E1149 Type 2 diabetes mellitus with other diabetic neurological complication: Secondary | ICD-10-CM | POA: Diagnosis not present

## 2019-12-04 DIAGNOSIS — F32A Depression, unspecified: Secondary | ICD-10-CM | POA: Diagnosis present

## 2019-12-04 DIAGNOSIS — J1282 Pneumonia due to coronavirus disease 2019: Secondary | ICD-10-CM | POA: Diagnosis present

## 2019-12-04 DIAGNOSIS — R069 Unspecified abnormalities of breathing: Secondary | ICD-10-CM | POA: Diagnosis not present

## 2019-12-04 DIAGNOSIS — Z87442 Personal history of urinary calculi: Secondary | ICD-10-CM

## 2019-12-04 DIAGNOSIS — Z8673 Personal history of transient ischemic attack (TIA), and cerebral infarction without residual deficits: Secondary | ICD-10-CM

## 2019-12-04 DIAGNOSIS — E1151 Type 2 diabetes mellitus with diabetic peripheral angiopathy without gangrene: Secondary | ICD-10-CM | POA: Diagnosis not present

## 2019-12-04 DIAGNOSIS — F329 Major depressive disorder, single episode, unspecified: Secondary | ICD-10-CM | POA: Diagnosis present

## 2019-12-04 DIAGNOSIS — Z833 Family history of diabetes mellitus: Secondary | ICD-10-CM

## 2019-12-04 DIAGNOSIS — E785 Hyperlipidemia, unspecified: Secondary | ICD-10-CM | POA: Diagnosis present

## 2019-12-04 DIAGNOSIS — U071 COVID-19: Secondary | ICD-10-CM | POA: Diagnosis not present

## 2019-12-04 DIAGNOSIS — F418 Other specified anxiety disorders: Secondary | ICD-10-CM | POA: Diagnosis not present

## 2019-12-04 DIAGNOSIS — I714 Abdominal aortic aneurysm, without rupture, unspecified: Secondary | ICD-10-CM | POA: Diagnosis present

## 2019-12-04 DIAGNOSIS — Z823 Family history of stroke: Secondary | ICD-10-CM

## 2019-12-04 DIAGNOSIS — Z9842 Cataract extraction status, left eye: Secondary | ICD-10-CM

## 2019-12-04 DIAGNOSIS — J440 Chronic obstructive pulmonary disease with acute lower respiratory infection: Secondary | ICD-10-CM | POA: Diagnosis present

## 2019-12-04 DIAGNOSIS — Z743 Need for continuous supervision: Secondary | ICD-10-CM | POA: Diagnosis not present

## 2019-12-04 DIAGNOSIS — H9190 Unspecified hearing loss, unspecified ear: Secondary | ICD-10-CM | POA: Diagnosis not present

## 2019-12-04 DIAGNOSIS — I739 Peripheral vascular disease, unspecified: Secondary | ICD-10-CM | POA: Diagnosis present

## 2019-12-04 DIAGNOSIS — Z961 Presence of intraocular lens: Secondary | ICD-10-CM | POA: Diagnosis present

## 2019-12-04 DIAGNOSIS — Z66 Do not resuscitate: Secondary | ICD-10-CM | POA: Diagnosis present

## 2019-12-04 DIAGNOSIS — Z79899 Other long term (current) drug therapy: Secondary | ICD-10-CM

## 2019-12-04 DIAGNOSIS — Z6832 Body mass index (BMI) 32.0-32.9, adult: Secondary | ICD-10-CM | POA: Diagnosis not present

## 2019-12-04 DIAGNOSIS — H409 Unspecified glaucoma: Secondary | ICD-10-CM | POA: Diagnosis not present

## 2019-12-04 DIAGNOSIS — R279 Unspecified lack of coordination: Secondary | ICD-10-CM | POA: Diagnosis not present

## 2019-12-04 DIAGNOSIS — J9601 Acute respiratory failure with hypoxia: Secondary | ICD-10-CM | POA: Diagnosis present

## 2019-12-04 DIAGNOSIS — Z87891 Personal history of nicotine dependence: Secondary | ICD-10-CM

## 2019-12-04 DIAGNOSIS — Z803 Family history of malignant neoplasm of breast: Secondary | ICD-10-CM

## 2019-12-04 DIAGNOSIS — J449 Chronic obstructive pulmonary disease, unspecified: Secondary | ICD-10-CM | POA: Diagnosis present

## 2019-12-04 DIAGNOSIS — K219 Gastro-esophageal reflux disease without esophagitis: Secondary | ICD-10-CM | POA: Diagnosis present

## 2019-12-04 DIAGNOSIS — I152 Hypertension secondary to endocrine disorders: Secondary | ICD-10-CM | POA: Diagnosis present

## 2019-12-04 DIAGNOSIS — M109 Gout, unspecified: Secondary | ICD-10-CM | POA: Diagnosis not present

## 2019-12-04 DIAGNOSIS — Z89412 Acquired absence of left great toe: Secondary | ICD-10-CM

## 2019-12-04 DIAGNOSIS — F419 Anxiety disorder, unspecified: Secondary | ICD-10-CM | POA: Diagnosis present

## 2019-12-04 DIAGNOSIS — G8929 Other chronic pain: Secondary | ICD-10-CM | POA: Diagnosis present

## 2019-12-04 DIAGNOSIS — J441 Chronic obstructive pulmonary disease with (acute) exacerbation: Secondary | ICD-10-CM | POA: Diagnosis present

## 2019-12-04 DIAGNOSIS — Z7982 Long term (current) use of aspirin: Secondary | ICD-10-CM

## 2019-12-04 DIAGNOSIS — Z9841 Cataract extraction status, right eye: Secondary | ICD-10-CM

## 2019-12-04 DIAGNOSIS — E1159 Type 2 diabetes mellitus with other circulatory complications: Secondary | ICD-10-CM | POA: Diagnosis present

## 2019-12-04 DIAGNOSIS — Z8701 Personal history of pneumonia (recurrent): Secondary | ICD-10-CM

## 2019-12-04 DIAGNOSIS — M545 Low back pain: Secondary | ICD-10-CM | POA: Diagnosis not present

## 2019-12-04 DIAGNOSIS — E7800 Pure hypercholesterolemia, unspecified: Secondary | ICD-10-CM | POA: Diagnosis present

## 2019-12-04 DIAGNOSIS — J069 Acute upper respiratory infection, unspecified: Secondary | ICD-10-CM | POA: Diagnosis not present

## 2019-12-04 DIAGNOSIS — Z7984 Long term (current) use of oral hypoglycemic drugs: Secondary | ICD-10-CM

## 2019-12-04 DIAGNOSIS — Z79891 Long term (current) use of opiate analgesic: Secondary | ICD-10-CM

## 2019-12-04 DIAGNOSIS — Z8616 Personal history of COVID-19: Secondary | ICD-10-CM

## 2019-12-04 DIAGNOSIS — Z8249 Family history of ischemic heart disease and other diseases of the circulatory system: Secondary | ICD-10-CM

## 2019-12-04 HISTORY — DX: Personal history of COVID-19: Z86.16

## 2019-12-04 LAB — D-DIMER, QUANTITATIVE: D-Dimer, Quant: 1.04 ug/mL-FEU — ABNORMAL HIGH (ref 0.00–0.50)

## 2019-12-04 LAB — CBC WITH DIFFERENTIAL/PLATELET
Abs Immature Granulocytes: 0.06 10*3/uL (ref 0.00–0.07)
Basophils Absolute: 0 10*3/uL (ref 0.0–0.1)
Basophils Relative: 0 %
Eosinophils Absolute: 0 10*3/uL (ref 0.0–0.5)
Eosinophils Relative: 0 %
HCT: 43.7 % (ref 39.0–52.0)
Hemoglobin: 13.9 g/dL (ref 13.0–17.0)
Immature Granulocytes: 1 %
Lymphocytes Relative: 19 %
Lymphs Abs: 1.3 10*3/uL (ref 0.7–4.0)
MCH: 30.6 pg (ref 26.0–34.0)
MCHC: 31.8 g/dL (ref 30.0–36.0)
MCV: 96.3 fL (ref 80.0–100.0)
Monocytes Absolute: 0.6 10*3/uL (ref 0.1–1.0)
Monocytes Relative: 8 %
Neutro Abs: 5.1 10*3/uL (ref 1.7–7.7)
Neutrophils Relative %: 72 %
Platelets: 202 10*3/uL (ref 150–400)
RBC: 4.54 MIL/uL (ref 4.22–5.81)
RDW: 14.2 % (ref 11.5–15.5)
WBC: 7.1 10*3/uL (ref 4.0–10.5)
nRBC: 0 % (ref 0.0–0.2)

## 2019-12-04 LAB — TROPONIN I (HIGH SENSITIVITY)
Troponin I (High Sensitivity): 10 ng/L (ref <18)
Troponin I (High Sensitivity): 10 ng/L (ref <18)

## 2019-12-04 LAB — COMPREHENSIVE METABOLIC PANEL
ALT: 28 U/L (ref 0–44)
AST: 34 U/L (ref 15–41)
Albumin: 3.1 g/dL — ABNORMAL LOW (ref 3.5–5.0)
Alkaline Phosphatase: 77 U/L (ref 38–126)
Anion gap: 11 (ref 5–15)
BUN: 30 mg/dL — ABNORMAL HIGH (ref 8–23)
CO2: 25 mmol/L (ref 22–32)
Calcium: 8.7 mg/dL — ABNORMAL LOW (ref 8.9–10.3)
Chloride: 103 mmol/L (ref 98–111)
Creatinine, Ser: 1.25 mg/dL — ABNORMAL HIGH (ref 0.61–1.24)
GFR calc Af Amer: >60 mL/min (ref >60)
GFR calc non Af Amer: 57 mL/min — ABNORMAL LOW (ref >60)
Glucose, Bld: 100 mg/dL — ABNORMAL HIGH (ref 70–99)
Potassium: 4.6 mmol/L (ref 3.5–5.1)
Sodium: 139 mmol/L (ref 135–145)
Total Bilirubin: 0.5 mg/dL (ref 0.3–1.2)
Total Protein: 6.7 g/dL (ref 6.5–8.1)

## 2019-12-04 LAB — LACTIC ACID, PLASMA
Lactic Acid, Venous: 1.1 mmol/L (ref 0.5–1.9)
Lactic Acid, Venous: 0.8 mmol/L (ref 0.5–1.9)

## 2019-12-04 LAB — LACTATE DEHYDROGENASE: LDH: 269 U/L — ABNORMAL HIGH (ref 98–192)

## 2019-12-04 LAB — GLUCOSE, CAPILLARY
Glucose-Capillary: 79 mg/dL (ref 70–99)
Glucose-Capillary: 288 mg/dL — ABNORMAL HIGH (ref 70–99)
Glucose-Capillary: 339 mg/dL — ABNORMAL HIGH (ref 70–99)

## 2019-12-04 LAB — C-REACTIVE PROTEIN: CRP: 9.4 mg/dL — ABNORMAL HIGH (ref <1.0)

## 2019-12-04 LAB — PROCALCITONIN: Procalcitonin: <0.1 ng/mL

## 2019-12-04 LAB — FERRITIN: Ferritin: 311 ng/mL (ref 24–336)

## 2019-12-04 LAB — TRIGLYCERIDES: Triglycerides: 85 mg/dL (ref <150)

## 2019-12-04 LAB — FIBRINOGEN: Fibrinogen: 673 mg/dL — ABNORMAL HIGH (ref 210–475)

## 2019-12-04 LAB — BRAIN NATRIURETIC PEPTIDE: B Natriuretic Peptide: 52.9 pg/mL (ref 0.0–100.0)

## 2019-12-04 MED ORDER — AMLODIPINE BESYLATE 5 MG PO TABS
2.5000 mg | ORAL_TABLET | Freq: Every day | ORAL | Status: DC
  Administered 2019-12-04 – 2019-12-07 (×4): 2.5 mg via ORAL
  Filled 2019-12-04 (×4): qty 1

## 2019-12-04 MED ORDER — ATORVASTATIN CALCIUM 40 MG PO TABS
80.0000 mg | ORAL_TABLET | Freq: Every day | ORAL | Status: DC
  Administered 2019-12-04 – 2019-12-07 (×4): 80 mg via ORAL
  Filled 2019-12-04 (×4): qty 2

## 2019-12-04 MED ORDER — BUPROPION HCL ER (SR) 100 MG PO TB12
200.0000 mg | ORAL_TABLET | Freq: Two times a day (BID) | ORAL | Status: DC
  Administered 2019-12-04 – 2019-12-08 (×8): 200 mg via ORAL
  Filled 2019-12-04 (×9): qty 2

## 2019-12-04 MED ORDER — BISACODYL 5 MG PO TBEC: 5.0000 mg | DELAYED_RELEASE_TABLET | Freq: Every day | ORAL | Status: DC | PRN

## 2019-12-04 MED ORDER — ONDANSETRON HCL 4 MG/2ML IJ SOLN: 4.0000 mg | Freq: Four times a day (QID) | INTRAMUSCULAR | Status: DC | PRN

## 2019-12-04 MED ORDER — HYDROCODONE-ACETAMINOPHEN 10-325 MG PO TABS: 0.5000 | ORAL_TABLET | Freq: Four times a day (QID) | ORAL | Status: DC | PRN

## 2019-12-04 MED ORDER — FLEET ENEMA 7-19 GM/118ML RE ENEM: 1.0000 | ENEMA | Freq: Once | RECTAL | Status: DC | PRN

## 2019-12-04 MED ORDER — ADULT MULTIVITAMIN W/MINERALS CH
1.0000 | ORAL_TABLET | Freq: Every day | ORAL | Status: DC
  Administered 2019-12-05 – 2019-12-08 (×4): 1 via ORAL
  Filled 2019-12-04 (×8): qty 1

## 2019-12-04 MED ORDER — HYDROCOD POLST-CPM POLST ER 10-8 MG/5ML PO SUER: 5.0000 mL | Freq: Two times a day (BID) | ORAL | Status: DC | PRN

## 2019-12-04 MED ORDER — SODIUM CHLORIDE 0.9 % IV SOLN
100.0000 mg | Freq: Every day | INTRAVENOUS | Status: AC
  Administered 2019-12-05 – 2019-12-08 (×4): 100 mg via INTRAVENOUS
  Filled 2019-12-04 (×5): qty 20

## 2019-12-04 MED ORDER — HYDROCODONE-ACETAMINOPHEN 7.5-325 MG/15ML PO SOLN: 10.0000 mL | Freq: Four times a day (QID) | ORAL | Status: DC | PRN

## 2019-12-04 MED ORDER — CLOPIDOGREL BISULFATE 75 MG PO TABS
75.0000 mg | ORAL_TABLET | Freq: Every day | ORAL | Status: DC
  Administered 2019-12-05 – 2019-12-08 (×4): 75 mg via ORAL
  Filled 2019-12-04 (×4): qty 1

## 2019-12-04 MED ORDER — POLYETHYLENE GLYCOL 3350 17 G PO PACK: 17.0000 g | PACK | Freq: Every day | ORAL | Status: DC | PRN

## 2019-12-04 MED ORDER — SODIUM CHLORIDE 0.9% FLUSH: 3.0000 mL | INTRAVENOUS | Status: DC | PRN

## 2019-12-04 MED ORDER — ASPIRIN EC 81 MG PO TBEC
81.0000 mg | DELAYED_RELEASE_TABLET | Freq: Every day | ORAL | Status: DC
  Administered 2019-12-05 – 2019-12-07 (×3): 81 mg via ORAL
  Filled 2019-12-04 (×3): qty 1

## 2019-12-04 MED ORDER — SODIUM CHLORIDE 0.9 % IV SOLN
200.0000 mg | Freq: Once | INTRAVENOUS | Status: AC
  Administered 2019-12-04: 200 mg via INTRAVENOUS
  Filled 2019-12-04: qty 40
  Filled 2019-12-04: qty 200

## 2019-12-04 MED ORDER — ASCORBIC ACID 500 MG PO TABS
1000.0000 mg | ORAL_TABLET | Freq: Every day | ORAL | Status: DC
  Administered 2019-12-05 – 2019-12-08 (×4): 1000 mg via ORAL
  Filled 2019-12-04 (×4): qty 2

## 2019-12-04 MED ORDER — OXYCODONE HCL 5 MG PO TABS: 5.0000 mg | ORAL_TABLET | ORAL | Status: DC | PRN

## 2019-12-04 MED ORDER — ZINC SULFATE 220 (50 ZN) MG PO CAPS
220.0000 mg | ORAL_CAPSULE | Freq: Every day | ORAL | Status: DC
  Administered 2019-12-05 – 2019-12-08 (×4): 220 mg via ORAL
  Filled 2019-12-04 (×5): qty 1

## 2019-12-04 MED ORDER — ZINC GLUCONATE 50 MG PO TABS: 50.0000 mg | ORAL_TABLET | Freq: Every day | ORAL | Status: DC

## 2019-12-04 MED ORDER — AEROCHAMBER PLUS FLO-VU LARGE MISC
1.0000 | Freq: Once | Status: AC
  Administered 2019-12-04: 1

## 2019-12-04 MED ORDER — DORZOLAMIDE HCL-TIMOLOL MAL 2-0.5 % OP SOLN
1.0000 [drp] | Freq: Two times a day (BID) | OPHTHALMIC | Status: DC
  Administered 2019-12-04 – 2019-12-08 (×6): 1 [drp] via OPHTHALMIC
  Filled 2019-12-04: qty 10

## 2019-12-04 MED ORDER — ALBUTEROL SULFATE HFA 108 (90 BASE) MCG/ACT IN AERS
2.0000 | INHALATION_SPRAY | RESPIRATORY_TRACT | Status: DC | PRN
  Administered 2019-12-04 – 2019-12-07 (×2): 2 via RESPIRATORY_TRACT
  Filled 2019-12-04: qty 6.7

## 2019-12-04 MED ORDER — ACETAMINOPHEN 325 MG PO TABS: 650.0000 mg | ORAL_TABLET | Freq: Four times a day (QID) | ORAL | Status: DC | PRN

## 2019-12-04 MED ORDER — LATANOPROST 0.005 % OP SOLN
1.0000 [drp] | Freq: Every day | OPHTHALMIC | Status: DC
  Filled 2019-12-04: qty 2.5

## 2019-12-04 MED ORDER — ONDANSETRON HCL 4 MG PO TABS: 4.0000 mg | ORAL_TABLET | Freq: Four times a day (QID) | ORAL | Status: DC | PRN

## 2019-12-04 MED ORDER — INSULIN ASPART 100 UNIT/ML ~~LOC~~ SOLN
0.0000 [IU] | Freq: Three times a day (TID) | SUBCUTANEOUS | Status: DC
  Administered 2019-12-04 – 2019-12-05 (×3): 8 [IU] via SUBCUTANEOUS
  Administered 2019-12-05: 3 [IU] via SUBCUTANEOUS
  Administered 2019-12-06: 8 [IU] via SUBCUTANEOUS
  Administered 2019-12-06: 15 [IU] via SUBCUTANEOUS
  Administered 2019-12-07 (×2): 3 [IU] via SUBCUTANEOUS
  Administered 2019-12-08: 0 [IU] via SUBCUTANEOUS

## 2019-12-04 MED ORDER — DEXAMETHASONE SODIUM PHOSPHATE 10 MG/ML IJ SOLN
6.0000 mg | INTRAMUSCULAR | Status: DC
  Administered 2019-12-05 – 2019-12-08 (×4): 6 mg via INTRAVENOUS
  Filled 2019-12-04 (×3): qty 1

## 2019-12-04 MED ORDER — SODIUM CHLORIDE 0.9% FLUSH
3.0000 mL | Freq: Two times a day (BID) | INTRAVENOUS | Status: DC
  Administered 2019-12-04 – 2019-12-08 (×7): 3 mL via INTRAVENOUS

## 2019-12-04 MED ORDER — CARVEDILOL 12.5 MG PO TABS
12.5000 mg | ORAL_TABLET | Freq: Two times a day (BID) | ORAL | Status: DC
  Administered 2019-12-04 – 2019-12-08 (×8): 12.5 mg via ORAL
  Filled 2019-12-04 (×8): qty 1

## 2019-12-04 MED ORDER — ENOXAPARIN SODIUM 40 MG/0.4ML ~~LOC~~ SOLN
40.0000 mg | SUBCUTANEOUS | Status: DC
  Administered 2019-12-04 – 2019-12-07 (×4): 40 mg via SUBCUTANEOUS
  Filled 2019-12-04 (×4): qty 0.4

## 2019-12-04 MED ORDER — PREGABALIN 50 MG PO CAPS
150.0000 mg | ORAL_CAPSULE | Freq: Three times a day (TID) | ORAL | Status: DC
  Administered 2019-12-04 – 2019-12-08 (×12): 150 mg via ORAL
  Filled 2019-12-04 (×12): qty 1

## 2019-12-04 MED ORDER — INSULIN ASPART 100 UNIT/ML ~~LOC~~ SOLN
0.0000 [IU] | Freq: Every day | SUBCUTANEOUS | Status: DC
  Administered 2019-12-04: 21:00:00 4 [IU] via SUBCUTANEOUS
  Administered 2019-12-05: 22:00:00 2 [IU] via SUBCUTANEOUS
  Administered 2019-12-06: 4 [IU] via SUBCUTANEOUS

## 2019-12-04 MED ORDER — GUAIFENESIN-DM 100-10 MG/5ML PO SYRP: 10.0000 mL | ORAL_SOLUTION | ORAL | Status: DC | PRN

## 2019-12-04 MED ORDER — ALBUTEROL SULFATE HFA 108 (90 BASE) MCG/ACT IN AERS
2.0000 | INHALATION_SPRAY | Freq: Once | RESPIRATORY_TRACT | Status: AC
  Administered 2019-12-04: 2 via RESPIRATORY_TRACT
  Filled 2019-12-04: qty 6.7

## 2019-12-04 MED ORDER — SODIUM CHLORIDE 0.9 % IV SOLN: 250.0000 mL | INTRAVENOUS | Status: DC | PRN

## 2019-12-04 MED ORDER — DEXAMETHASONE SODIUM PHOSPHATE 10 MG/ML IJ SOLN
10.0000 mg | Freq: Once | INTRAMUSCULAR | Status: AC
  Administered 2019-12-04: 10:00:00 10 mg via INTRAVENOUS
  Filled 2019-12-04: qty 1
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
## 2019-12-04 NOTE — ED Notes (Signed)
PTAR on Unit to transfer pt to [facility] per MD order. Claiborne Billings RN to accompany PTAR.

## 2019-12-04 NOTE — Plan of Care (Signed)
Pt arrived to A&Ox4, on 2L, O2, [place] sating 92%, displays dyspnea with exertion. Denies pain. Skin check completed with Mitzi Davenport (please see skin integrity note). Will continue to monitor.   Problem: Education: Goal: Knowledge of risk factors and measures for prevention of condition will improve Outcome: Progressing   Problem: Coping: Goal: Psychosocial and spiritual needs will be supported Outcome: Progressing   Problem: Respiratory: Goal: Will maintain a patent airway Outcome: Progressing Goal: Complications related to the disease process, condition or treatment will be avoided or minimized Outcome: Progressing
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
## 2019-12-04 NOTE — ED Notes (Signed)
Attempted to call nursing report.  

## 2019-12-04 NOTE — ED Triage Notes (Signed)
Pt to ED via POV s/o SHOB. Pt reports dx with COVID on Tuesday, woke up this morning with difficulty breathing. Room Air o2 sats 78%, currently on 2L sats 94%

## 2019-12-05 DIAGNOSIS — I10 Essential (primary) hypertension: Secondary | ICD-10-CM

## 2019-12-05 DIAGNOSIS — E1149 Type 2 diabetes mellitus with other diabetic neurological complication: Secondary | ICD-10-CM

## 2019-12-05 LAB — CBC WITH DIFFERENTIAL/PLATELET
Abs Immature Granulocytes: 0.05 10*3/uL (ref 0.00–0.07)
Basophils Absolute: 0 10*3/uL (ref 0.0–0.1)
Basophils Relative: 0 %
Eosinophils Absolute: 0 10*3/uL (ref 0.0–0.5)
Eosinophils Relative: 0 %
HCT: 41.8 % (ref 39.0–52.0)
Hemoglobin: 13.3 g/dL (ref 13.0–17.0)
Immature Granulocytes: 1 %
Lymphocytes Relative: 16 %
Lymphs Abs: 1.1 10*3/uL (ref 0.7–4.0)
MCH: 30.1 pg (ref 26.0–34.0)
MCHC: 31.8 g/dL (ref 30.0–36.0)
MCV: 94.6 fL (ref 80.0–100.0)
Monocytes Absolute: 0.4 10*3/uL (ref 0.1–1.0)
Monocytes Relative: 6 %
Neutro Abs: 5 10*3/uL (ref 1.7–7.7)
Neutrophils Relative %: 77 %
Platelets: 210 10*3/uL (ref 150–400)
RBC: 4.42 MIL/uL (ref 4.22–5.81)
RDW: 13.8 % (ref 11.5–15.5)
WBC: 6.5 10*3/uL (ref 4.0–10.5)
nRBC: 0 % (ref 0.0–0.2)

## 2019-12-05 LAB — COMPREHENSIVE METABOLIC PANEL
ALT: 24 U/L (ref 0–44)
AST: 22 U/L (ref 15–41)
Albumin: 3.2 g/dL — ABNORMAL LOW (ref 3.5–5.0)
Alkaline Phosphatase: 71 U/L (ref 38–126)
Anion gap: 10 (ref 5–15)
BUN: 28 mg/dL — ABNORMAL HIGH (ref 8–23)
CO2: 27 mmol/L (ref 22–32)
Calcium: 8.6 mg/dL — ABNORMAL LOW (ref 8.9–10.3)
Chloride: 103 mmol/L (ref 98–111)
Creatinine, Ser: 0.78 mg/dL (ref 0.61–1.24)
GFR calc Af Amer: >60 mL/min (ref >60)
GFR calc non Af Amer: >60 mL/min (ref >60)
Glucose, Bld: 226 mg/dL — ABNORMAL HIGH (ref 70–99)
Potassium: 4.4 mmol/L (ref 3.5–5.1)
Sodium: 140 mmol/L (ref 135–145)
Total Bilirubin: 0.5 mg/dL (ref 0.3–1.2)
Total Protein: 7 g/dL (ref 6.5–8.1)

## 2019-12-05 LAB — C-REACTIVE PROTEIN: CRP: 8.7 mg/dL — ABNORMAL HIGH (ref <1.0)

## 2019-12-05 LAB — GLUCOSE, CAPILLARY
Glucose-Capillary: 164 mg/dL — ABNORMAL HIGH (ref 70–99)
Glucose-Capillary: 305 mg/dL — ABNORMAL HIGH (ref 70–99)
Glucose-Capillary: 270 mg/dL — ABNORMAL HIGH (ref 70–99)
Glucose-Capillary: 238 mg/dL — ABNORMAL HIGH (ref 70–99)

## 2019-12-05 LAB — D-DIMER, QUANTITATIVE: D-Dimer, Quant: 0.95 ug/mL-FEU — ABNORMAL HIGH (ref 0.00–0.50)

## 2019-12-05 MED ORDER — PANTOPRAZOLE SODIUM 40 MG PO TBEC
40.0000 mg | DELAYED_RELEASE_TABLET | Freq: Every day | ORAL | Status: DC
  Administered 2019-12-05 – 2019-12-08 (×4): 40 mg via ORAL
  Filled 2019-12-05 (×6): qty 1

## 2019-12-05 MED ORDER — INSULIN DETEMIR 100 UNIT/ML ~~LOC~~ SOLN
5.0000 [IU] | Freq: Every day | SUBCUTANEOUS | Status: DC
  Administered 2019-12-05: 5 [IU] via SUBCUTANEOUS
  Filled 2019-12-05 (×2): qty 0.05

## 2019-12-05 NOTE — Progress Notes (Signed)
   12/05/19 1338  Family/Significant Other Communication  Family/Significant Other Update Called;Updated  Talked to wife, who also has covid.

## 2019-12-05 NOTE — Plan of Care (Signed)
Pt A&Ox4, remains on 2L [place], denies pain. Pt encouraged and supervised while using I/s and flutter valve several times throughout the day. Pt ambulated in the hallway endorsed dizziness and SOB. Desat to 75% while walking, increase O2 to 6 L's, (84-87%) pt able to ambulate 50 ft in hallway. When returned to room pt recovered to 91% on 2L [place]. Now up in chair. Will continue to monitor.   Problem: Education: Goal: Knowledge of risk factors and measures for prevention of condition will improve Outcome: Progressing   Problem: Coping: Goal: Psychosocial and spiritual needs will be supported Outcome: Progressing   Problem: Respiratory: Goal: Will maintain a patent airway Outcome: Progressing Goal: Complications related to the disease process, condition or treatment will be avoided or minimized Outcome: Progressing

## 2019-12-05 NOTE — Progress Notes (Signed)
PROGRESS NOTE                                                                                                                                                                                                             Patient Demographics:    [name], is a 74 y.o. male, DOB - 03/27/1946, [id number]  Admit date - 12/04/2019   Admitting Physician Karmen Bongo, MD  Outpatient Primary MD for the patient is [name], MD  LOS - 1   Chief Complaint  Patient presents with  . covid postiive  . Shortness of Breath       Brief Narrative   74 y.o. male with medical history significant of CVA (2007); HTN; HLD; DM; chronic back pain; PVD s/p B iliac stent placement followed by L great toe amputation in 05/2019; COPD; and AAA presenting with worsening COVID symptoms.  The patient reports about 10 days of COVID symptoms - myalgias, fatigue, malaise, SOB.  SOB worsening over the last few days.  +intermittent cough.  He has had some diarrhea.  His wife is also ill with COVID, although less than him.    Subjective:    [name] today [place] any chest pain, still reports some dyspnea, but report it did improve, he still reporting some cough as well .   Assessment  & Plan :    Principal Problem:   Acute respiratory disease due to COVID-19 virus Active Problems:   Diabetes mellitus type 2 with neurological manifestations (HCC)   Hyperlipidemia   Essential hypertension   Abdominal aortic aneurysm (AAA) without rupture (HCC)   COPD (chronic obstructive pulmonary disease) (HCC)   Depression with anxiety   PVD (peripheral vascular disease) ([place])   Acute hypoxic respiratory failure due to COVID-19 for pneumonia -Patient requiring 2 L nasal cannula, and significant for bilateral opacities. -Continue with IV steroids -Continue with IV remdesivir -He was encouraged to use incentive spirometry, and flutter valve, encouraged to get out of bed to  chair. -Continue to trend inflammatory markers closely. -So far I see no indication for Actemra  COVID-19 Labs  Recent Labs    12/04/19 0912 12/05/19 0215  DDIMER 1.04* 0.95*  FERRITIN 311  --   LDH 269*  --   CRP 9.4* 8.7*    Lab Results  Component Value Date  [place] NEGATIVE 05/25/2019    HTN -Continue Norvasc, Coreg -Hold Cozaar in the setting of AKI, likely associated with COVID infection with continued use of Metformin and ACE as well as Advil  HLD -Continue Lipitor -Hold fish oil due to limited utility in the inpatient setting  DM -Will check A1c; previously with very good control (5.4) -hold Amaryl, Glucophage -Cover with moderate-scale SSI -BG started to increase, will start on low-dose Levemir  COPD -Continue Albuterol HFA but increase frequency if needed due to acute respiratory condition  AAA -4.1 cm  -Dr. Carlis Abbott is following -He is due for 6 month f/u appt next month  PVD -s/p iliac stents and L great toe amputation -Continue ASA and Plavix  Depression/anxiety -Continue Wellbutrin  Glaucoma -Continue Cosopt, Xalatan  Chronic pain -Continue Norco, Lyrica  PAD - continue with aspirin and Plavix   Code Status : DNR  Family Communication  : Discussed with his wife  Disposition Plan  : Home  Barriers For Discharge : Remains hypoxic, on IV remdesivir, and IV steroids  Consults  :  None  Procedures  : None  DVT Prophylaxis  :  [place] Lovenox  Lab Results  Component Value Date   PLT 210 12/05/2019    Antibiotics  :    Anti-infectives (From admission, onward)   Start     Dose/Rate Route Frequency Ordered Stop   12/05/19 1000  remdesivir 100 mg in sodium chloride 0.9 % 100 mL IVPB     100 mg 200 mL/hr over 30 Minutes Intravenous Daily 12/04/19 1156 12/09/19 0959   12/04/19 1300  remdesivir 200 mg in sodium chloride 0.9% 250 mL IVPB     200 mg 580 mL/hr over 30 Minutes Intravenous Once 12/04/19 1156 12/04/19 1644         Objective:   Vitals:   12/05/19 0810 12/05/19 0815 12/05/19 0817 12/05/19 1156  BP:    (!) 130/100  Pulse:  78 74 78  Resp:  (!) 31 (!) 21 17  Temp:    97.7 F (36.5 C)  TempSrc:    Oral  SpO2: (!) 84% (!) 86% 90% 90%  Weight:      Height:        Wt Readings from Last 3 Encounters:  12/04/19 98.9 kg  06/30/19 96.6 kg  05/29/19 97.3 kg     Intake/Output Summary (Last 24 hours) at 12/05/2019 1345 Last data filed at 12/05/2019 1000 Gross per 24 hour  Intake 540 ml  Output 601 ml  Net -61 ml     Physical Exam  Awake Alert, Oriented X 3, No new F.N deficits, Normal affect Symmetrical Chest wall movement, Good air movement bilaterally, CTAB RRR,No Gallops,Rubs or new Murmurs, No Parasternal Heave +ve B.Sounds, Abd Soft, No tenderness, No organomegaly appriciated, No rebound - guarding or rigidity. No Cyanosis, Clubbing or edema, No new Rash or bruise      Data Review:    CBC Recent Labs  Lab 12/04/19 0912 12/05/19 0215  WBC 7.1 6.5  HGB 13.9 13.3  HCT 43.7 41.8  PLT 202 210  MCV 96.3 94.6  MCH 30.6 30.1  MCHC 31.8 31.8  RDW 14.2 13.8  LYMPHSABS 1.3 1.1  MONOABS 0.6 0.4  EOSABS 0.0 0.0  BASOSABS 0.0 0.0    Chemistries  Recent Labs  Lab 12/04/19 0912 12/05/19 0215  NA 139 140  K 4.6 4.4  CL 103 103  CO2 25 27  GLUCOSE 100* 226*  BUN 30* 28*  CREATININE  1.25* 0.78  CALCIUM 8.7* 8.6*  AST 34 22  ALT 28 24  ALKPHOS 77 71  BILITOT 0.5 0.5   ------------------------------------------------------------------------------------------------------------------ Recent Labs    12/04/19 0912  TRIG 85    Lab Results  Component Value Date   HGBA1C 5.6 07/01/2019   ------------------------------------------------------------------------------------------------------------------ No results for input(s): TSH, T4TOTAL, T3FREE, THYROIDAB in the last 72 hours.  Invalid input(s):  FREET3 ------------------------------------------------------------------------------------------------------------------ Recent Labs    12/04/19 0912  FERRITIN 311    Coagulation profile No results for input(s): INR, PROTIME in the last 168 hours.  Recent Labs    12/04/19 0912 12/05/19 0215  DDIMER 1.04* 0.95*    Cardiac Enzymes No results for input(s): CKMB, TROPONINI, MYOGLOBIN in the last 168 hours.  Invalid input(s): CK ------------------------------------------------------------------------------------------------------------------    Component Value Date/Time   BNP 52.9 12/04/2019 0912    Inpatient Medications  Scheduled Meds: . amLODipine  2.5 mg Oral QHS  . vitamin C  1,000 mg Oral Daily  . aspirin EC  81 mg Oral QHS  . atorvastatin  80 mg Oral q1800  . buPROPion  200 mg Oral BID  . carvedilol  12.5 mg Oral BID WC  . clopidogrel  75 mg Oral Q breakfast  . dexamethasone (DECADRON) injection  6 mg Intravenous Q24H  . dorzolamide-timolol  1 drop Both Eyes BID  . enoxaparin (LOVENOX) injection  40 mg Subcutaneous Q24H  . insulin aspart  0-15 Units Subcutaneous TID WC  . insulin aspart  0-5 Units Subcutaneous QHS  . insulin detemir  5 Units Subcutaneous Daily  . latanoprost  1 drop Both Eyes QHS  . multivitamin with minerals  1 tablet Oral Daily  . pantoprazole  40 mg Oral Daily  . pregabalin  150 mg Oral TID  . sodium chloride flush  3 mL Intravenous Q12H  . zinc sulfate  220 mg Oral Daily   Continuous Infusions: . sodium chloride    . remdesivir 100 mg in NS 100 mL 100 mg (12/05/19 0814)   PRN Meds:.sodium chloride, acetaminophen, albuterol, bisacodyl, chlorpheniramine-HYDROcodone, guaiFENesin-dextromethorphan, HYDROcodone-acetaminophen, ondansetron **OR** ondansetron (ZOFRAN) IV, oxyCODONE, polyethylene glycol, sodium chloride flush, sodium phosphate  Micro Results No results found for this or any previous visit (from the past 240  hour(s)).  Radiology Reports DG Chest Port 1 View  Result Date: 12/04/2019 CLINICAL DATA:  Shortness of breath.  Hypoxia.  COVID positive. EXAM: PORTABLE CHEST 1 VIEW COMPARISON:  12/09/2018. FINDINGS: Mediastinum and hilar structures normal. Cardiomegaly. No pulmonary venous congestion. Bilateral multifocal pulmonary infiltrates. No pleural effusion or pneumothorax. Degenerative changes scoliosis thoracic spine. IMPRESSION: 1.  Bilateral multifocal pulmonary infiltrates. 2.  Cardiomegaly.  No pulmonary venous congestion. Electronically Signed   By: Marcello Moores  Register   On: 12/04/2019 09:52      Tere Mcconaughey M.D on 12/05/2019 at 1:45 PM  Between 7am to 7pm - Pager - [phone]  After 7pm go to [email or web address] - password [facility]  [facility] -  Office  [phone]

## 2019-12-06 ENCOUNTER — Other Ambulatory Visit: Payer: Self-pay | Admitting: Family Medicine

## 2019-12-06 LAB — COMPREHENSIVE METABOLIC PANEL
ALT: 28 U/L (ref 0–44)
AST: 23 U/L (ref 15–41)
Albumin: 3 g/dL — ABNORMAL LOW (ref 3.5–5.0)
Alkaline Phosphatase: 67 U/L (ref 38–126)
Anion gap: 8 (ref 5–15)
BUN: 27 mg/dL — ABNORMAL HIGH (ref 8–23)
CO2: 29 mmol/L (ref 22–32)
Calcium: 8.9 mg/dL (ref 8.9–10.3)
Chloride: 102 mmol/L (ref 98–111)
Creatinine, Ser: 0.79 mg/dL (ref 0.61–1.24)
GFR calc Af Amer: >60 mL/min (ref >60)
GFR calc non Af Amer: >60 mL/min (ref >60)
Glucose, Bld: 184 mg/dL — ABNORMAL HIGH (ref 70–99)
Potassium: 5 mmol/L (ref 3.5–5.1)
Sodium: 139 mmol/L (ref 135–145)
Total Bilirubin: 0.4 mg/dL (ref 0.3–1.2)
Total Protein: 6.9 g/dL (ref 6.5–8.1)

## 2019-12-06 LAB — CBC WITH DIFFERENTIAL/PLATELET
Abs Immature Granulocytes: 0.1 10*3/uL — ABNORMAL HIGH (ref 0.00–0.07)
Basophils Absolute: 0 10*3/uL (ref 0.0–0.1)
Basophils Relative: 0 %
Eosinophils Absolute: 0 10*3/uL (ref 0.0–0.5)
Eosinophils Relative: 0 %
HCT: 42.5 % (ref 39.0–52.0)
Hemoglobin: 13.5 g/dL (ref 13.0–17.0)
Immature Granulocytes: 1 %
Lymphocytes Relative: 13 %
Lymphs Abs: 1.3 10*3/uL (ref 0.7–4.0)
MCH: 30.1 pg (ref 26.0–34.0)
MCHC: 31.8 g/dL (ref 30.0–36.0)
MCV: 94.9 fL (ref 80.0–100.0)
Monocytes Absolute: 0.8 10*3/uL (ref 0.1–1.0)
Monocytes Relative: 8 %
Neutro Abs: 8.2 10*3/uL — ABNORMAL HIGH (ref 1.7–7.7)
Neutrophils Relative %: 78 %
Platelets: 254 10*3/uL (ref 150–400)
RBC: 4.48 MIL/uL (ref 4.22–5.81)
RDW: 13.7 % (ref 11.5–15.5)
WBC: 10.3 10*3/uL (ref 4.0–10.5)
nRBC: 0 % (ref 0.0–0.2)

## 2019-12-06 LAB — GLUCOSE, CAPILLARY
Glucose-Capillary: 147 mg/dL — ABNORMAL HIGH (ref 70–99)
Glucose-Capillary: 235 mg/dL — ABNORMAL HIGH (ref 70–99)
Glucose-Capillary: 361 mg/dL — ABNORMAL HIGH (ref 70–99)
Glucose-Capillary: 328 mg/dL — ABNORMAL HIGH (ref 70–99)

## 2019-12-06 LAB — HEMOGLOBIN A1C
Hgb A1c MFr Bld: 6.5 % — ABNORMAL HIGH (ref 4.8–5.6)
Mean Plasma Glucose: 139.85 mg/dL

## 2019-12-06 LAB — C-REACTIVE PROTEIN: CRP: 2.6 mg/dL — ABNORMAL HIGH (ref <1.0)

## 2019-12-06 LAB — D-DIMER, QUANTITATIVE: D-Dimer, Quant: 0.73 ug/mL-FEU — ABNORMAL HIGH (ref 0.00–0.50)

## 2019-12-06 MED ORDER — SODIUM POLYSTYRENE SULFONATE 15 GM/60ML PO SUSP
15.0000 g | Freq: Once | ORAL | Status: AC
  Administered 2019-12-06: 15 g via ORAL
  Filled 2019-12-06: qty 60

## 2019-12-06 MED ORDER — INSULIN DETEMIR 100 UNIT/ML ~~LOC~~ SOLN
10.0000 [IU] | Freq: Every day | SUBCUTANEOUS | Status: DC
  Administered 2019-12-06: 10:00:00 10 [IU] via SUBCUTANEOUS
  Filled 2019-12-06 (×2): qty 0.1

## 2019-12-06 MED ORDER — INSULIN ASPART 100 UNIT/ML ~~LOC~~ SOLN
7.0000 [IU] | Freq: Once | SUBCUTANEOUS | Status: AC
  Administered 2019-12-06: 21:00:00 7 [IU] via SUBCUTANEOUS

## 2019-12-06 NOTE — Progress Notes (Signed)
PROGRESS NOTE                                                                                                                                                                                                             Patient Demographics:    [name], is a 74 y.o. male, DOB - Apr 26, 1946, [id number]  Admit date - 12/04/2019   Admitting Physician Karmen Bongo, MD  Outpatient Primary MD for the patient is [name], MD  LOS - 2   Chief Complaint  Patient presents with  . covid postiive  . Shortness of Breath       Brief Narrative   74 y.o. male with medical history significant of CVA (2007); HTN; HLD; DM; chronic back pain; PVD s/p B iliac stent placement followed by L great toe amputation in 05/2019; COPD; and AAA presenting with worsening COVID symptoms.  The patient reports about 10 days of COVID symptoms - myalgias, fatigue, malaise, SOB.  SOB worsening over the last few days.  +intermittent cough.  He has had some diarrhea.  His wife is also ill with COVID, although less than him.    Subjective:    [name] today denies any chest pain, reports his dyspnea has improving, still reports some cough .   Assessment  & Plan :    Principal Problem:   Acute respiratory disease due to COVID-19 virus Active Problems:   Diabetes mellitus type 2 with neurological manifestations (HCC)   Hyperlipidemia   Essential hypertension   Abdominal aortic aneurysm (AAA) without rupture (HCC)   COPD (chronic obstructive pulmonary disease) (HCC)   Depression with anxiety   PVD (peripheral vascular disease) ([place])   Acute hypoxic respiratory failure due to COVID-19 for pneumonia -Patient requiring 2 L nasal cannula, and significant for bilateral opacities.  Remains on 2 L nasal cannula at rest, yesterday he does desaturate 75% on 6 L with activity. -Continue with IV steroids -Continue with IV remdesivir -He was encouraged to use incentive  spirometry, and flutter valve, encouraged to get out of bed to chair. -Continue to trend inflammatory markers closely. -So far I see no indication for Actemra  COVID-19 Labs  Recent Labs    12/04/19 0912 12/05/19 0215 12/06/19 0212  DDIMER 1.04* 0.95* 0.73*  FERRITIN 311  --   --   LDH 269*  --   --  CRP 9.4* 8.7* 2.6*    Lab Results  Component Value Date   [place] NEGATIVE 05/25/2019    HTN -Continue Norvasc, Coreg -Hold Cozaar in the setting of AKI, likely associated with COVID infection with continued use of Metformin and ACE as well as Advil  HLD -Continue Lipitor -Hold fish oil due to limited utility in the inpatient setting  DM -Will check A1c; previously with very good control (5.4) -hold Amaryl, Glucophage -Cover with moderate-scale SSI -Continue with insulin sliding scale, CBG uncontrolled, will increase Levemir from 5 to 10 units  COPD -Continue Albuterol HFA but increase frequency if needed due to acute respiratory condition  AAA -4.1 cm  -Dr. Carlis Abbott is following -He is due for 6 month f/u appt next month  PVD -s/p iliac stents and L great toe amputation -Continue ASA and Plavix  Depression/anxiety -Continue Wellbutrin  Glaucoma -Continue Cosopt, Xalatan  Chronic pain -Continue Norco, Lyrica  PAD - continue with aspirin and Plavix   Code Status : DNR  Family Communication  : Discussed with his wife 1/31  Disposition Plan  : Home  Barriers For Discharge : Remains hypoxic, on IV remdesivir, and IV steroids  Consults  :  None  Procedures  : None  DVT Prophylaxis  :  [place] Lovenox  Lab Results  Component Value Date   PLT 254 12/06/2019    Antibiotics  :    Anti-infectives (From admission, onward)   Start     Dose/Rate Route Frequency Ordered Stop   12/05/19 1000  remdesivir 100 mg in sodium chloride 0.9 % 100 mL IVPB     100 mg 200 mL/hr over 30 Minutes Intravenous Daily 12/04/19 1156 12/09/19 0959   12/04/19  1300  remdesivir 200 mg in sodium chloride 0.9% 250 mL IVPB     200 mg 580 mL/hr over 30 Minutes Intravenous Once 12/04/19 1156 12/04/19 1644        Objective:   Vitals:   12/05/19 1156 12/05/19 1615 12/05/19 1906 12/06/19 0713  BP: (!) 130/100 (!) 153/84 133/75 132/75  Pulse: 78 89 73 64  Resp: 17 20 17  (!) 21  Temp: 97.7 F (36.5 C) 98.1 F (36.7 C) 98 F (36.7 C) 97.9 F (36.6 C)  TempSrc: Oral Oral Oral Oral  SpO2: 90% 92% 94% 92%  Weight:      Height:        Wt Readings from Last 3 Encounters:  12/04/19 98.9 kg  06/30/19 96.6 kg  05/29/19 97.3 kg     Intake/Output Summary (Last 24 hours) at 12/06/2019 1357 Last data filed at 12/06/2019 1049 Gross per 24 hour  Intake 860 ml  Output 1525 ml  Net -665 ml     Physical Exam  Awake Alert, Oriented X 3, No new F.N deficits, Normal affect Symmetrical Chest wall movement, Good air movement bilaterally, CTAB RRR,No Gallops,Rubs or new Murmurs, No Parasternal Heave +ve B.Sounds, Abd Soft, No tenderness, No rebound - guarding or rigidity. No Cyanosis, Clubbing or edema, No new Rash or bruise        Data Review:    CBC Recent Labs  Lab 12/04/19 0912 12/05/19 0215 12/06/19 0212  WBC 7.1 6.5 10.3  HGB 13.9 13.3 13.5  HCT 43.7 41.8 42.5  PLT 202 210 254  MCV 96.3 94.6 94.9  MCH 30.6 30.1 30.1  MCHC 31.8 31.8 31.8  RDW 14.2 13.8 13.7  LYMPHSABS 1.3 1.1 1.3  MONOABS 0.6 0.4 0.8  EOSABS 0.0 0.0 0.0  BASOSABS  0.0 0.0 0.0    Chemistries  Recent Labs  Lab 12/04/19 0912 12/05/19 0215 12/06/19 0212  NA 139 140 139  K 4.6 4.4 5.0  CL 103 103 102  CO2 25 27 29   GLUCOSE 100* 226* 184*  BUN 30* 28* 27*  CREATININE 1.25* 0.78 0.79  CALCIUM 8.7* 8.6* 8.9  AST 34 22 23  ALT 28 24 28   ALKPHOS 77 71 67  BILITOT 0.5 0.5 0.4   ------------------------------------------------------------------------------------------------------------------ Recent Labs    12/04/19 0912  TRIG 85    Lab Results   Component Value Date   HGBA1C 5.6 07/01/2019   ------------------------------------------------------------------------------------------------------------------ No results for input(s): TSH, T4TOTAL, T3FREE, THYROIDAB in the last 72 hours.  Invalid input(s): FREET3 ------------------------------------------------------------------------------------------------------------------ Recent Labs    12/04/19 0912  FERRITIN 311    Coagulation profile No results for input(s): INR, PROTIME in the last 168 hours.  Recent Labs    12/05/19 0215 12/06/19 0212  DDIMER 0.95* 0.73*    Cardiac Enzymes No results for input(s): CKMB, TROPONINI, MYOGLOBIN in the last 168 hours.  Invalid input(s): CK ------------------------------------------------------------------------------------------------------------------    Component Value Date/Time   BNP 52.9 12/04/2019 0912    Inpatient Medications  Scheduled Meds: . amLODipine  2.5 mg Oral QHS  . vitamin C  1,000 mg Oral Daily  . aspirin EC  81 mg Oral QHS  . atorvastatin  80 mg Oral q1800  . buPROPion  200 mg Oral BID  . carvedilol  12.5 mg Oral BID WC  . clopidogrel  75 mg Oral Q breakfast  . dexamethasone (DECADRON) injection  6 mg Intravenous Q24H  . dorzolamide-timolol  1 drop Both Eyes BID  . enoxaparin (LOVENOX) injection  40 mg Subcutaneous Q24H  . insulin aspart  0-15 Units Subcutaneous TID WC  . insulin aspart  0-5 Units Subcutaneous QHS  . insulin detemir  10 Units Subcutaneous Daily  . latanoprost  1 drop Both Eyes QHS  . multivitamin with minerals  1 tablet Oral Daily  . pantoprazole  40 mg Oral Daily  . pregabalin  150 mg Oral TID  . sodium chloride flush  3 mL Intravenous Q12H  . zinc sulfate  220 mg Oral Daily   Continuous Infusions: . sodium chloride    . remdesivir 100 mg in NS 100 mL 100 mg (12/06/19 1003)   PRN Meds:.sodium chloride, acetaminophen, albuterol, bisacodyl, chlorpheniramine-HYDROcodone,  guaiFENesin-dextromethorphan, HYDROcodone-acetaminophen, ondansetron **OR** ondansetron (ZOFRAN) IV, oxyCODONE, polyethylene glycol, sodium chloride flush, sodium phosphate  Micro Results Recent Results (from the past 240 hour(s))  Blood Culture (routine x 2)     Status: None (Preliminary result)   Collection Time: 12/04/19 10:18 AM   Specimen: BLOOD  Result Value Ref Range Status   Specimen Description BLOOD RIGHT ANTECUBITAL  Final   Special Requests   Final    BOTTLES DRAWN AEROBIC AND ANAEROBIC Blood Culture results may not be optimal due to an inadequate volume of blood received in culture bottles   Culture   Final    NO GROWTH 1 DAY Performed at [facility], [place] [address] [place], [place] [address]    Report Status PENDING  Incomplete  Blood Culture (routine x 2)     Status: None (Preliminary result)   Collection Time: 12/04/19 10:26 AM   Specimen: BLOOD LEFT FOREARM  Result Value Ref Range Status   Specimen Description BLOOD LEFT FOREARM  Final   Special Requests   Final    BOTTLES DRAWN AEROBIC AND ANAEROBIC  Blood Culture adequate volume   Culture   Final    NO GROWTH 1 DAY Performed at [place] [address] [place], [place] [address]    Report Status PENDING  Incomplete    Radiology Reports DG Chest Port 1 View  Result Date: 12/04/2019 CLINICAL DATA:  Shortness of breath.  Hypoxia.  COVID positive. EXAM: PORTABLE CHEST 1 VIEW COMPARISON:  12/09/2018. FINDINGS: Mediastinum and hilar structures normal. Cardiomegaly. No pulmonary venous congestion. Bilateral multifocal pulmonary infiltrates. No pleural effusion or pneumothorax. Degenerative changes scoliosis thoracic spine. IMPRESSION: 1.  Bilateral multifocal pulmonary infiltrates. 2.  Cardiomegaly.  No pulmonary venous congestion. Electronically Signed   By: Marcello Moores  Register   On: 12/04/2019 09:52      Emeline Gins Tien Spooner M.D on 12/06/2019 at 1:57 PM  Between 7am to 7pm - Pager -  [phone]  After 7pm go to [email or web address] - password [facility]  [facility] -  Office  [phone]

## 2019-12-06 NOTE — Plan of Care (Signed)
  Problem: Education: Goal: Knowledge of risk factors and measures for prevention of condition will improve Outcome: Progressing   Problem: Coping: Goal: Psychosocial and spiritual needs will be supported Outcome: Progressing   Problem: Respiratory: Goal: Will maintain a patent airway Outcome: Progressing Goal: Complications related to the disease process, condition or treatment will be avoided or minimized Outcome: Progressing   

## 2019-12-07 LAB — C-REACTIVE PROTEIN: CRP: 0.9 mg/dL (ref <1.0)

## 2019-12-07 LAB — CBC WITH DIFFERENTIAL/PLATELET
Abs Immature Granulocytes: 0.09 10*3/uL — ABNORMAL HIGH (ref 0.00–0.07)
Basophils Absolute: 0 10*3/uL (ref 0.0–0.1)
Basophils Relative: 0 %
Eosinophils Absolute: 0 10*3/uL (ref 0.0–0.5)
Eosinophils Relative: 0 %
HCT: 41.5 % (ref 39.0–52.0)
Hemoglobin: 13.4 g/dL (ref 13.0–17.0)
Immature Granulocytes: 1 %
Lymphocytes Relative: 18 %
Lymphs Abs: 1.6 10*3/uL (ref 0.7–4.0)
MCH: 30.2 pg (ref 26.0–34.0)
MCHC: 32.3 g/dL (ref 30.0–36.0)
MCV: 93.7 fL (ref 80.0–100.0)
Monocytes Absolute: 0.8 10*3/uL (ref 0.1–1.0)
Monocytes Relative: 9 %
Neutro Abs: 6.3 10*3/uL (ref 1.7–7.7)
Neutrophils Relative %: 72 %
Platelets: 262 10*3/uL (ref 150–400)
RBC: 4.43 MIL/uL (ref 4.22–5.81)
RDW: 13.5 % (ref 11.5–15.5)
WBC: 8.8 10*3/uL (ref 4.0–10.5)
nRBC: 0 % (ref 0.0–0.2)

## 2019-12-07 LAB — COMPREHENSIVE METABOLIC PANEL
ALT: 32 U/L (ref 0–44)
AST: 24 U/L (ref 15–41)
Albumin: 3.1 g/dL — ABNORMAL LOW (ref 3.5–5.0)
Alkaline Phosphatase: 70 U/L (ref 38–126)
Anion gap: 10 (ref 5–15)
BUN: 30 mg/dL — ABNORMAL HIGH (ref 8–23)
CO2: 26 mmol/L (ref 22–32)
Calcium: 8.8 mg/dL — ABNORMAL LOW (ref 8.9–10.3)
Chloride: 103 mmol/L (ref 98–111)
Creatinine, Ser: 0.75 mg/dL (ref 0.61–1.24)
GFR calc Af Amer: >60 mL/min (ref >60)
GFR calc non Af Amer: >60 mL/min (ref >60)
Glucose, Bld: 145 mg/dL — ABNORMAL HIGH (ref 70–99)
Potassium: 4 mmol/L (ref 3.5–5.1)
Sodium: 139 mmol/L (ref 135–145)
Total Bilirubin: 0.6 mg/dL (ref 0.3–1.2)
Total Protein: 6.6 g/dL (ref 6.5–8.1)

## 2019-12-07 LAB — GLUCOSE, CAPILLARY
Glucose-Capillary: 408 mg/dL — ABNORMAL HIGH (ref 70–99)
Glucose-Capillary: 118 mg/dL — ABNORMAL HIGH (ref 70–99)
Glucose-Capillary: 225 mg/dL — ABNORMAL HIGH (ref 70–99)
Glucose-Capillary: 217 mg/dL — ABNORMAL HIGH (ref 70–99)
Glucose-Capillary: 197 mg/dL — ABNORMAL HIGH (ref 70–99)

## 2019-12-07 LAB — D-DIMER, QUANTITATIVE: D-Dimer, Quant: 0.73 ug/mL-FEU — ABNORMAL HIGH (ref 0.00–0.50)

## 2019-12-07 MED ORDER — INSULIN DETEMIR 100 UNIT/ML ~~LOC~~ SOLN
15.0000 [IU] | Freq: Every day | SUBCUTANEOUS | Status: DC
  Administered 2019-12-07 – 2019-12-08 (×2): 15 [IU] via SUBCUTANEOUS
  Filled 2019-12-07 (×2): qty 0.15

## 2019-12-07 MED ORDER — INSULIN ASPART 100 UNIT/ML ~~LOC~~ SOLN
4.0000 [IU] | Freq: Three times a day (TID) | SUBCUTANEOUS | Status: DC
  Administered 2019-12-07 – 2019-12-08 (×5): 4 [IU] via SUBCUTANEOUS

## 2019-12-07 NOTE — Progress Notes (Signed)
Pt stable with no signs of distress. Report given to oncoming nurse. Safety maintained.  

## 2019-12-07 NOTE — Progress Notes (Signed)
Inpatient Diabetes Program Recommendations  AACE/ADA: New Consensus Statement on Inpatient Glycemic Control   Target Ranges:  Prepandial:   less than 140 mg/dL      Peak postprandial:   less than 180 mg/dL (1-2 hours)      Critically ill patients:  140 - 180 mg/dL   Results for LENORRIS, RUTT (MRN [id number]) as of 12/07/2019 09:55  Ref. Range 12/06/2019 07:59 12/06/2019 12:01 12/06/2019 15:51 12/06/2019 21:44 12/07/2019 07:25  Glucose-Capillary Latest Ref Range: 70 - 99 mg/dL 147 (H) 235 (H) 361 (H) 328 (H) 118 (H)   Review of Glycemic Control  Diabetes history: DM2  Outpatient Diabetes medications: Amaryl 2 mg TID, Metformin 500 mg QAM Current orders for Inpatient glycemic control: Levemir 15 units daily, Novolog 4 units TID with meals, Novolog 0-15 units TID with meals, Novolog 0-5 units QHS; Decadron 6 mg Q24H  Inpatient Diabetes Program Recommendations:    Insulin-Basal: Noted Levemir increased from 10 to 15 units daily today.   Insulin-Meal Coverage: If steroids are continued and if post prandial glucose is consistently greater than 180 mg/dl, please consider increasing meal coverage to Novolog 8 units TID with meals if patient eats at least 50% of meals.  Thanks, Barnie Alderman, RN, MSN, CDE Diabetes Coordinator Inpatient Diabetes Program [phone] (Team Pager from 8am to 5pm)

## 2019-12-07 NOTE — Progress Notes (Signed)
PROGRESS NOTE                                                                                                                                                                                                             Patient Demographics:    [name], is a 74 y.o. male, DOB - 1946/10/20, [id number]  Admit date - 12/04/2019   Admitting Physician Karmen Bongo, MD  Outpatient Primary MD for the patient is [name], MD  LOS - 3   Chief Complaint  Patient presents with  . covid postiive  . Shortness of Breath       Brief Narrative   74 y.o. male with medical history significant of CVA (2007); HTN; HLD; DM; chronic back pain; PVD s/p B iliac stent placement followed by L great toe amputation in 05/2019; COPD; and AAA presenting with worsening COVID symptoms.  The patient reports about 10 days of COVID symptoms - myalgias, fatigue, malaise, SOB.  SOB worsening over the last few days.  +intermittent cough.  He has had some diarrhea.  His wife is also ill with COVID, although less than him.    Subjective:    [name] today denies any chest pain, reports his dyspnea has improving, still reports some cough .   Assessment  & Plan :    Principal Problem:   Acute respiratory disease due to COVID-19 virus Active Problems:   Diabetes mellitus type 2 with neurological manifestations (HCC)   Hyperlipidemia   Essential hypertension   Abdominal aortic aneurysm (AAA) without rupture (HCC)   COPD (chronic obstructive pulmonary disease) (HCC)   Depression with anxiety   PVD (peripheral vascular disease) ([place])   Acute hypoxic respiratory failure due to COVID-19 for pneumonia -Patient requiring 2 L nasal cannula, and significant for bilateral opacities.  Remains on 2 L nasal cannula at rest, is desaturating on room air with activity, will need home oxygen on discharge . -Continue with IV steroids -Continue with IV remdesivir -He was  encouraged to use incentive spirometry, and flutter valve, encouraged to get out of bed to chair. -Continue to trend inflammatory markers closely. -So far I see no indication for Actemra  COVID-19 Labs  Recent Labs    12/05/19 0215 12/06/19 0212 12/07/19 0415  DDIMER 0.95* 0.73* 0.73*  CRP 8.7* 2.6* 0.9  Lab Results  Component Value Date   [place] NEGATIVE 05/25/2019    HTN -Continue Norvasc, Coreg, will resume Cozaar as AKI resolved  AKI -Hold Cozaar on admission in the setting of AKI, likely associated with COVID infection with continued use of Metformin and ACE as well as Advil -Resolved  HLD -Continue Lipitor -Hold fish oil due to limited utility in the inpatient setting  DM -A1c is 6.5 -hold Amaryl, Glucophage -Cover with moderate-scale SSI, Levemir 15 units daily, and 40 units before meals.  COPD -Continue Albuterol HFA but increase frequency if needed due to acute respiratory condition  AAA -4.1 cm  -Dr. Carlis Abbott is following -He is due for 6 month f/u appt next month  PVD -s/p iliac stents and L great toe amputation -Continue ASA and Plavix  Depression/anxiety -Continue Wellbutrin  Glaucoma -Continue Cosopt, Xalatan  Chronic pain -Continue Norco, Lyrica  PAD - continue with aspirin and Plavix   Code Status : DNR  Family Communication  : Discussed with his wife 1/30  Disposition Plan  : Home  Barriers For Discharge : Home tomorrow with home oxygen  Consults  :  None  Procedures  : None  DVT Prophylaxis  :  [place] Lovenox  Lab Results  Component Value Date   PLT 262 12/07/2019    Antibiotics  :    Anti-infectives (From admission, onward)   Start     Dose/Rate Route Frequency Ordered Stop   12/05/19 1000  remdesivir 100 mg in sodium chloride 0.9 % 100 mL IVPB     100 mg 200 mL/hr over 30 Minutes Intravenous Daily 12/04/19 1156 12/09/19 0959   12/04/19 1300  remdesivir 200 mg in sodium chloride 0.9% 250 mL IVPB      200 mg 580 mL/hr over 30 Minutes Intravenous Once 12/04/19 1156 12/04/19 1644        Objective:   Vitals:   12/07/19 0020 12/07/19 0411 12/07/19 0725 12/07/19 1154  BP: (!) 148/80 (!) 145/75 140/76 135/69  Pulse: 69 66 71 64  Resp: 18 (!) 23 (!) 21 (!) 25  Temp: 98 F (36.7 C) 98.4 F (36.9 C) 98.4 F (36.9 C) 98 F (36.7 C)  TempSrc: Oral Oral Oral Oral  SpO2: 97% 95% 92% 92%  Weight:      Height:        Wt Readings from Last 3 Encounters:  12/04/19 98.9 kg  06/30/19 96.6 kg  05/29/19 97.3 kg     Intake/Output Summary (Last 24 hours) at 12/07/2019 1315 Last data filed at 12/07/2019 0400 Gross per 24 hour  Intake 600 ml  Output 1850 ml  Net -1250 ml     Physical Exam  Awake Alert, Oriented X 3, No new F.N deficits, Normal affect Symmetrical Chest wall movement, Good air movement bilaterally, CTAB RRR,No Gallops,Rubs or new Murmurs, No Parasternal Heave +ve B.Sounds, Abd Soft, No tenderness, No rebound - guarding or rigidity. No Cyanosis, Clubbing or edema, No new Rash or bruise        Data Review:    CBC Recent Labs  Lab 12/04/19 0912 12/05/19 0215 12/06/19 0212 12/07/19 0415  WBC 7.1 6.5 10.3 8.8  HGB 13.9 13.3 13.5 13.4  HCT 43.7 41.8 42.5 41.5  PLT 202 210 254 262  MCV 96.3 94.6 94.9 93.7  MCH 30.6 30.1 30.1 30.2  MCHC 31.8 31.8 31.8 32.3  RDW 14.2 13.8 13.7 13.5  LYMPHSABS 1.3 1.1 1.3 1.6  MONOABS 0.6 0.4 0.8 0.8  EOSABS 0.0 0.0  0.0 0.0  BASOSABS 0.0 0.0 0.0 0.0    Chemistries  Recent Labs  Lab 12/04/19 0912 12/05/19 0215 12/06/19 0212 12/07/19 0415  NA 139 140 139 139  K 4.6 4.4 5.0 4.0  CL 103 103 102 103  CO2 25 27 29 26   GLUCOSE 100* 226* 184* 145*  BUN 30* 28* 27* 30*  CREATININE 1.25* 0.78 0.79 0.75  CALCIUM 8.7* 8.6* 8.9 8.8*  AST 34 22 23 24   ALT 28 24 28  32  ALKPHOS 77 71 67 70  BILITOT 0.5 0.5 0.4 0.6    ------------------------------------------------------------------------------------------------------------------ No results for input(s): CHOL, HDL, LDLCALC, TRIG, CHOLHDL, LDLDIRECT in the last 72 hours.  Lab Results  Component Value Date   HGBA1C 6.5 (H) 12/06/2019   ------------------------------------------------------------------------------------------------------------------ No results for input(s): TSH, T4TOTAL, T3FREE, THYROIDAB in the last 72 hours.  Invalid input(s): FREET3 ------------------------------------------------------------------------------------------------------------------ No results for input(s): VITAMINB12, FOLATE, FERRITIN, TIBC, IRON, RETICCTPCT in the last 72 hours.  Coagulation profile No results for input(s): INR, PROTIME in the last 168 hours.  Recent Labs    12/06/19 0212 12/07/19 0415  DDIMER 0.73* 0.73*    Cardiac Enzymes No results for input(s): CKMB, TROPONINI, MYOGLOBIN in the last 168 hours.  Invalid input(s): CK ------------------------------------------------------------------------------------------------------------------    Component Value Date/Time   BNP 52.9 12/04/2019 0912    Inpatient Medications  Scheduled Meds: . amLODipine  2.5 mg Oral QHS  . vitamin C  1,000 mg Oral Daily  . aspirin EC  81 mg Oral QHS  . atorvastatin  80 mg Oral q1800  . buPROPion  200 mg Oral BID  . carvedilol  12.5 mg Oral BID WC  . clopidogrel  75 mg Oral Q breakfast  . dexamethasone (DECADRON) injection  6 mg Intravenous Q24H  . dorzolamide-timolol  1 drop Both Eyes BID  . enoxaparin (LOVENOX) injection  40 mg Subcutaneous Q24H  . insulin aspart  0-15 Units Subcutaneous TID WC  . insulin aspart  0-5 Units Subcutaneous QHS  . insulin aspart  4 Units Subcutaneous TID WC  . insulin detemir  15 Units Subcutaneous Daily  . latanoprost  1 drop Both Eyes QHS  . multivitamin with minerals  1 tablet Oral Daily  . pantoprazole  40 mg Oral Daily   . pregabalin  150 mg Oral TID  . sodium chloride flush  3 mL Intravenous Q12H  . zinc sulfate  220 mg Oral Daily   Continuous Infusions: . sodium chloride    . remdesivir 100 mg in NS 100 mL 100 mg (12/07/19 0929)   PRN Meds:.sodium chloride, acetaminophen, albuterol, bisacodyl, chlorpheniramine-HYDROcodone, guaiFENesin-dextromethorphan, HYDROcodone-acetaminophen, ondansetron **OR** ondansetron (ZOFRAN) IV, oxyCODONE, polyethylene glycol, sodium chloride flush, sodium phosphate  Micro Results Recent Results (from the past 240 hour(s))  Blood Culture (routine x 2)     Status: None (Preliminary result)   Collection Time: 12/04/19 10:18 AM   Specimen: BLOOD  Result Value Ref Range Status   Specimen Description BLOOD RIGHT ANTECUBITAL  Final   Special Requests   Final    BOTTLES DRAWN AEROBIC AND ANAEROBIC Blood Culture results may not be optimal due to an inadequate volume of blood received in culture bottles   Culture   Final    NO GROWTH 3 DAYS Performed at [facility], [place] [address] [place], [place] [address]    Report Status PENDING  Incomplete  Blood Culture (routine x 2)     Status: None (Preliminary result)   Collection Time: 12/04/19 10:26 AM  Specimen: BLOOD LEFT FOREARM  Result Value Ref Range Status   Specimen Description BLOOD LEFT FOREARM  Final   Special Requests   Final    BOTTLES DRAWN AEROBIC AND ANAEROBIC Blood Culture adequate volume   Culture   Final    NO GROWTH 3 DAYS Performed at [facility], [address], [place], [place] [address]    Report Status PENDING  Incomplete    Radiology Reports DG Chest Port 1 View  Result Date: 12/04/2019 CLINICAL DATA:  Shortness of breath.  Hypoxia.  COVID positive. EXAM: PORTABLE CHEST 1 VIEW COMPARISON:  12/09/2018. FINDINGS: Mediastinum and hilar structures normal. Cardiomegaly. No pulmonary venous congestion. Bilateral multifocal pulmonary infiltrates. No pleural effusion or pneumothorax.  Degenerative changes scoliosis thoracic spine. IMPRESSION: 1.  Bilateral multifocal pulmonary infiltrates. 2.  Cardiomegaly.  No pulmonary venous congestion. Electronically Signed   By: Marcello Moores  Register   On: 12/04/2019 09:52      Victorious Cosio M.D on 12/07/2019 at 1:15 PM  Between 7am to 7pm - Pager - [phone]  After 7pm go to [email or web address] - password [facility]  [facility] -  Office  [phone]

## 2019-12-07 NOTE — Progress Notes (Signed)
SATURATION QUALIFICATIONS: (This note is used to comply with regulatory documentation for home oxygen)  Patient Saturations on Room Air at Rest = 94%  Patient Saturations on Room Air while Ambulating = 84%  Patient Saturations on 2 Liters of oxygen while Ambulating = 86-94%  Please briefly explain why patient needs home oxygen: Pt requires supplemental oxygen to maintain SpO2 at 88% and above while ambulating.

## 2019-12-07 NOTE — Progress Notes (Signed)
Ambulation Note  Saturation Pre: 94 on RA  Ambulation Distance: 460 ft  Saturation During Ambulation: 86-94% on 2L  Notes: Pt desaturated to 84% on RA after standing up out of bed. Placed on 2L and SpO2 increased quickly. SpO2 was maintained on 2L. Pt walked independently. Tolerated well. Pt returned to bed side with call bell within reach, SpO2 was 95% on 1L.   Philis Kendall, MS, ACSM CEP 10:53 AM 12/07/2019

## 2019-12-08 ENCOUNTER — Ambulatory Visit: Payer: Medicare Other | Admitting: Family Medicine

## 2019-12-08 DIAGNOSIS — F418 Other specified anxiety disorders: Secondary | ICD-10-CM

## 2019-12-08 DIAGNOSIS — J9601 Acute respiratory failure with hypoxia: Secondary | ICD-10-CM

## 2019-12-08 LAB — GLUCOSE, CAPILLARY
Glucose-Capillary: 100 mg/dL — ABNORMAL HIGH (ref 70–99)
Glucose-Capillary: 119 mg/dL — ABNORMAL HIGH (ref 70–99)

## 2019-12-08 LAB — CBC WITH DIFFERENTIAL/PLATELET
Abs Immature Granulocytes: 0.16 10*3/uL — ABNORMAL HIGH (ref 0.00–0.07)
Basophils Absolute: 0.1 10*3/uL (ref 0.0–0.1)
Basophils Relative: 1 %
Eosinophils Absolute: 0.1 10*3/uL (ref 0.0–0.5)
Eosinophils Relative: 1 %
HCT: 40.3 % (ref 39.0–52.0)
Hemoglobin: 13 g/dL (ref 13.0–17.0)
Immature Granulocytes: 2 %
Lymphocytes Relative: 24 %
Lymphs Abs: 2 10*3/uL (ref 0.7–4.0)
MCH: 30.4 pg (ref 26.0–34.0)
MCHC: 32.3 g/dL (ref 30.0–36.0)
MCV: 94.4 fL (ref 80.0–100.0)
Monocytes Absolute: 0.8 10*3/uL (ref 0.1–1.0)
Monocytes Relative: 10 %
Neutro Abs: 5.2 10*3/uL (ref 1.7–7.7)
Neutrophils Relative %: 62 %
Platelets: 251 10*3/uL (ref 150–400)
RBC: 4.27 MIL/uL (ref 4.22–5.81)
RDW: 13.5 % (ref 11.5–15.5)
WBC: 8.3 10*3/uL (ref 4.0–10.5)
nRBC: 0 % (ref 0.0–0.2)

## 2019-12-08 LAB — COMPREHENSIVE METABOLIC PANEL WITH GFR
ALT: 31 U/L (ref 0–44)
AST: 20 U/L (ref 15–41)
Albumin: 3 g/dL — ABNORMAL LOW (ref 3.5–5.0)
Alkaline Phosphatase: 68 U/L (ref 38–126)
Anion gap: 12 (ref 5–15)
BUN: 29 mg/dL — ABNORMAL HIGH (ref 8–23)
CO2: 25 mmol/L (ref 22–32)
Calcium: 8.4 mg/dL — ABNORMAL LOW (ref 8.9–10.3)
Chloride: 100 mmol/L (ref 98–111)
Creatinine, Ser: 0.77 mg/dL (ref 0.61–1.24)
GFR calc Af Amer: >60 mL/min
GFR calc non Af Amer: >60 mL/min
Glucose, Bld: 129 mg/dL — ABNORMAL HIGH (ref 70–99)
Potassium: 3.6 mmol/L (ref 3.5–5.1)
Sodium: 137 mmol/L (ref 135–145)
Total Bilirubin: 0.7 mg/dL (ref 0.3–1.2)
Total Protein: 6.3 g/dL — ABNORMAL LOW (ref 6.5–8.1)

## 2019-12-08 LAB — D-DIMER, QUANTITATIVE: D-Dimer, Quant: 0.92 ug/mL-FEU — ABNORMAL HIGH (ref 0.00–0.50)

## 2019-12-08 LAB — C-REACTIVE PROTEIN: CRP: 0.9 mg/dL (ref <1.0)

## 2019-12-08 MED ORDER — POTASSIUM CHLORIDE CRYS ER 20 MEQ PO TBCR
40.0000 meq | EXTENDED_RELEASE_TABLET | Freq: Once | ORAL | Status: AC
  Administered 2019-12-08: 08:00:00 40 meq via ORAL
  Filled 2019-12-08: qty 2

## 2019-12-08 MED ORDER — PANTOPRAZOLE SODIUM 40 MG PO TBEC: 40.0000 mg | DELAYED_RELEASE_TABLET | Freq: Every day | ORAL | 0 refills | Status: DC

## 2019-12-08 MED ORDER — DEXAMETHASONE 6 MG PO TABS: 6.0000 mg | ORAL_TABLET | Freq: Every day | ORAL | 0 refills | Status: DC
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
## 2019-12-08 NOTE — Discharge Instructions (Signed)
Person Under Monitoring Name: [name]  Location: [place] [address]   Infection Prevention Recommendations for Individuals Confirmed to have, or Being Evaluated for, 2019 Novel Coronavirus (COVID-19) Infection Who Receive Care at Home  Individuals who are confirmed to have, or are being evaluated for, COVID-19 should follow the prevention steps below until a healthcare provider or local or state health department says they can return to normal activities.  Stay home except to get medical care You should restrict activities outside your home, except for getting medical care. Do not go to work, school, or public areas, and do not use public transportation or taxis.  Call ahead before visiting your doctor Before your medical appointment, call the healthcare provider and tell them that you have, or are being evaluated for, COVID-19 infection. This will help the healthcare provider's office take steps to keep other people from getting infected. Ask your healthcare provider to call the local or state health department.  Monitor your symptoms Seek prompt medical attention if your illness is worsening (e.g., difficulty breathing). Before going to your medical appointment, call the healthcare provider and tell them that you have, or are being evaluated for, COVID-19 infection. Ask your healthcare provider to call the local or state health department.  Wear a facemask You should wear a facemask that covers your nose and mouth when you are in the same room with other people and when you visit a healthcare provider. People who live with or visit you should also wear a facemask while they are in the same room with you.  Separate yourself from other people in your home As much as possible, you should stay in a different room from other people in your home. Also, you should use a separate bathroom, if available.  Avoid sharing household items You should not  share dishes, drinking glasses, cups, eating utensils, towels, bedding, or other items with other people in your home. After using these items, you should wash them thoroughly with soap and water.  Cover your coughs and sneezes Cover your mouth and nose with a tissue when you cough or sneeze, or you can cough or sneeze into your sleeve. Throw used tissues in a lined trash can, and immediately wash your hands with soap and water for at least 20 seconds or use an alcohol-based hand rub.  Wash your [facility] your hands often and thoroughly with soap and water for at least 20 seconds. You can use an alcohol-based hand sanitizer if soap and water are not available and if your hands are not visibly dirty. Avoid touching your eyes, nose, and mouth with unwashed hands.   Prevention Steps for Caregivers and Household Members of Individuals Confirmed to have, or Being Evaluated for, COVID-19 Infection Being Cared for in the Home  If you live with, or provide care at home for, a person confirmed to have, or being evaluated for, COVID-19 infection please follow these guidelines to prevent infection:  Follow healthcare provider's instructions Make sure that you understand and can help the patient follow any healthcare provider instructions for all care.  Provide for the patient's basic needs You should help the patient with basic needs in the home and provide support for getting groceries, prescriptions, and other personal needs.  Monitor the patient's symptoms If they are getting sicker, call his or her medical provider and tell them that the patient has, or is being evaluated for, COVID-19 infection. This will help the healthcare provider's  office take steps to keep other people from getting infected. Ask the healthcare provider to call the local or state health department.  Limit the number of people who have contact with the patient  If possible, have only one caregiver for the  patient.  Other household members should stay in another home or place of residence. If this is not possible, they should stay  in another room, or be separated from the patient as much as possible. Use a separate bathroom, if available.  Restrict visitors who do not have an essential need to be in the home.  Keep older adults, very young children, and other sick people away from the patient Keep older adults, very young children, and those who have compromised immune systems or chronic health conditions away from the patient. This includes people with chronic heart, lung, or kidney conditions, diabetes, and cancer.  Ensure good ventilation Make sure that shared spaces in the home have good air flow, such as from an air conditioner or an opened window, weather permitting.  Wash your hands often  Wash your hands often and thoroughly with soap and water for at least 20 seconds. You can use an alcohol based hand sanitizer if soap and water are not available and if your hands are not visibly dirty.  Avoid touching your eyes, nose, and mouth with unwashed hands.  Use disposable paper towels to dry your hands. If not available, use dedicated cloth towels and replace them when they become wet.  Wear a facemask and gloves  Wear a disposable facemask at all times in the room and gloves when you touch or have contact with the patient's blood, body fluids, and/or secretions or excretions, such as sweat, saliva, sputum, nasal mucus, vomit, urine, or feces.  Ensure the mask fits over your nose and mouth tightly, and do not touch it during use.  Throw out disposable facemasks and gloves after using them. Do not reuse.  Wash your hands immediately after removing your facemask and gloves.  If your personal clothing becomes contaminated, carefully remove clothing and launder. Wash your hands after handling contaminated clothing.  Place all used disposable facemasks, gloves, and other waste in a lined  container before disposing them with other household waste.  Remove gloves and wash your hands immediately after handling these items.  Do not share dishes, glasses, or other household items with the patient  Avoid sharing household items. You should not share dishes, drinking glasses, cups, eating utensils, towels, bedding, or other items with a patient who is confirmed to have, or being evaluated for, COVID-19 infection.  After the person uses these items, you should wash them thoroughly with soap and water.  Wash laundry thoroughly  Immediately remove and wash clothes or bedding that have blood, body fluids, and/or secretions or excretions, such as sweat, saliva, sputum, nasal mucus, vomit, urine, or feces, on them.  Wear gloves when handling laundry from the patient.  Read and follow directions on labels of laundry or clothing items and detergent. In general, wash and dry with the warmest temperatures recommended on the label.  Clean all areas the individual has used often  Clean all touchable surfaces, such as counters, tabletops, doorknobs, bathroom fixtures, toilets, phones, keyboards, tablets, and bedside tables, every day. Also, clean any surfaces that may have blood, body fluids, and/or secretions or excretions on them.  Wear gloves when cleaning surfaces the patient has come in contact with.  Use a diluted bleach solution (e.g., dilute bleach with 1  part bleach and 10 parts water) or a household disinfectant with a label that says EPA-registered for coronaviruses. To make a bleach solution at home, add 1 tablespoon of bleach to 1 quart (4 cups) of water. For a larger supply, add  cup of bleach to 1 gallon (16 cups) of water.  Read labels of cleaning products and follow recommendations provided on product labels. Labels contain instructions for safe and effective use of the cleaning product including precautions you should take when applying the product, such as wearing gloves or  eye protection and making sure you have good ventilation during use of the product.  Remove gloves and wash hands immediately after cleaning.  Monitor yourself for signs and symptoms of illness Caregivers and household members are considered close contacts, should monitor their health, and will be asked to limit movement outside of the home to the extent possible. Follow the monitoring steps for close contacts listed on the symptom monitoring form.   ? If you have additional questions, contact your local health department or call the epidemiologist on call at [phone] (available 24/7). ? This guidance is subject to change. For the most up-to-date guidance from [facility], please refer to their website: [email or web address]

## 2019-12-08 NOTE — TOC Transition Note (Signed)
Transition of Care [facility]) - CM/SW Discharge Note   Patient Details  Name: [name] MRN: [id number] Date of Birth: 1946-03-01  [facility] of [facility]) CM/SW Contact:  Ross Ludwig, LCSW Phone Number: 12/08/2019, 11:52 AM   Clinical Narrative:     Patient will be discharging back home with his wife, and oxygen.  CSW spoke to patient's wife and informed her that he may be ready for discharge today.  Patient's wife stated she has Covid and had some questions for physician or bedside nurse, CSW asked them to speak to her.  CSW spoke to [place] at [facility] and they will deliver a portable and a concentrator to his home, and notify CSW once equipment has been delivered.   Final next level of care: Home/Self Care Barriers to Discharge: Barriers Resolved   Patient Goals and CMS Choice Patient states their goals for this hospitalization and ongoing recovery are:: To return back home with his wife. CMS Medicare.gov Compare Post Acute Care list provided to:: Patient Represenative (must comment) Choice offered to / list presented to : Spouse  Discharge Placement                       Discharge Plan and Services                DME Arranged: Oxygen DME Agency: [facility] Date DME Agency Contacted: 12/08/19 Time DME Agency Contacted: [id number] Representative spoke with at DME Agency: Gracy Racer Arranged: NA          Social Determinants of Health ([place]) Interventions     Readmission Risk Interventions No flowsheet data found.

## 2019-12-08 NOTE — Progress Notes (Deleted)
SATURATION QUALIFICATIONS: (This note is used to comply with regulatory documentation for home oxygen)  Patient Saturations on Room Air at Rest = 80  Patient Saturations on 2 Liters  at Rest 91%    Patient Saturations on 3 Liters of oxygen while Ambulating = 90%  Please briefly explain why patient needs home oxygen: To be safe at home

## 2019-12-08 NOTE — Progress Notes (Signed)
Ambulation Note  Saturation Pre: 91% on RA  Ambulation Distance: 460 ft  Saturation During Ambulation: 87-91% on 3L  Notes: Pt's SpO2 dropped to 86% upon standing and recovered to >90% on 2L. About 50 ft into the walk SpO2 fell to 82%, increased to 3L where SpO2 stayed at 87-91%. Pt walked independently. Tolerated well. Pt returned to bed side with call bell within reach, SpO2 93% on 2L.   Philis Kendall, MS, ACSM CEP 10:40 AM 12/08/2019

## 2019-12-08 NOTE — Care Management Important Message (Signed)
Important Message  Patient Details  Name: [name] MRN: [id number] Date of Birth: 01/21/1946   Medicare Important Message Given:  Yes - Important Message mailed due to current National Emergency  Verbal consent obtained due to current National Emergency  Relationship to patient: Spouse/Significant Other Contact Name: Skanda Doub Call Date: 12/08/19  Time: 1403 Phone: [phone] Outcome: Spoke with contact Important Message mailed to: Patient address on file    [place] 12/08/2019, 2:04 PM

## 2019-12-08 NOTE — Progress Notes (Addendum)
SATURATION QUALIFICATIONS: (This note is used to comply with regulatory documentation for home oxygen)  Patient Saturations on Room Air at Rest = 91%  Patient Saturations on Room Air while Ambulating = 86%  Patient Saturations on 3 Liters of oxygen while Ambulating = 91%  Please briefly explain why patient needs home oxygen: Pt requires supplemental oxygen to maintain SpO2 at 88% and above while ambulating.

## 2019-12-09 ENCOUNTER — Telehealth: Payer: Self-pay | Admitting: *Deleted

## 2019-12-09 LAB — CULTURE, BLOOD (ROUTINE X 2)
Culture: NO GROWTH
Culture: NO GROWTH
Special Requests: ADEQUATE

## 2019-12-09 NOTE — Telephone Encounter (Signed)
LVM for pt to call office to schedule virtual hospital follow up.

## 2019-12-10 ENCOUNTER — Encounter: Payer: Self-pay | Admitting: Podiatry

## 2019-12-10 ENCOUNTER — Encounter (INDEPENDENT_AMBULATORY_CARE_PROVIDER_SITE_OTHER): Payer: Self-pay | Admitting: Bariatrics

## 2019-12-10 ENCOUNTER — Other Ambulatory Visit: Payer: Self-pay

## 2019-12-10 ENCOUNTER — Telehealth (INDEPENDENT_AMBULATORY_CARE_PROVIDER_SITE_OTHER): Payer: Medicare Other | Admitting: Bariatrics

## 2019-12-10 DIAGNOSIS — E6609 Other obesity due to excess calories: Secondary | ICD-10-CM | POA: Diagnosis not present

## 2019-12-10 DIAGNOSIS — E1149 Type 2 diabetes mellitus with other diabetic neurological complication: Secondary | ICD-10-CM

## 2019-12-10 DIAGNOSIS — Z6831 Body mass index (BMI) 31.0-31.9, adult: Secondary | ICD-10-CM

## 2019-12-10 DIAGNOSIS — I10 Essential (primary) hypertension: Secondary | ICD-10-CM | POA: Diagnosis not present

## 2019-12-10 NOTE — Telephone Encounter (Signed)
We can have his visit and then set an appt just for labs. I reviewed his hospital labs and there is nothing that is terribly urgent to repeat.

## 2019-12-10 NOTE — Telephone Encounter (Signed)
Transition Care Management Follow-up Telephone Call   Date discharged? 12/07/18   How have you been since you were released from the hospital? "Doing well"    Do you understand why you were in the hospital? yes   Do you understand the discharge instructions? yes   Where were you discharged to? Home w/ wife.   Items Reviewed:  Medications reviewed: "They added dexamethasone and pantoprazole. Everything else is the same."  Allergies reviewed: yes  Dietary changes reviewed: yes  Referrals reviewed: yes   Functional Questionnaire:   Activities of Daily Living (ADLs):   He states they are independent in the following: ambulation, bathing and hygiene, feeding, continence, grooming, toileting and dressing States they require assistance with the following: na   Any transportation issues/concerns?: no   Any patient concerns? yes, reports pt was told he would need to come in for lab work and want to know how he will get labs since his follow up is virtual.    Confirmed importance and date/time of follow-up visits scheduled yes  Provider Appointment booked with PCP 12/25/19 virtually  Confirmed with patient if condition begins to worsen call PCP or go to the ER.  Patient was given the office number and encouraged to call back with question or concerns.  : yes

## 2019-12-11 NOTE — Telephone Encounter (Signed)
Pt.notified

## 2019-12-14 NOTE — Progress Notes (Signed)
TeleHealth Visit:  Due to the COVID-19 pandemic, this visit was completed with telemedicine (audio/video) technology to reduce patient and provider exposure as well as to preserve personal protective equipment.   [name] has verbally consented to this TeleHealth visit. The patient is located at home, the provider is located at the [facility] office. The participants in this visit include the listed provider and patient. The visit was conducted today via FaceTime.  Chief Complaint: OBESITY Benzino is here to discuss his progress with his obesity treatment plan along with follow-up of his obesity related diagnoses. Vito is on the Category 3 Plan and states he is following his eating plan approximately 0% of the time. Govind states he is exercising 0 minutes 0 times per week.  Today's visit was #: 30 Starting weight: 236 lbs Starting date: 08/25/2018  Interim History: Dwyane states that he has had COVID-19 and was hospitalized, but is now out of the hospital (weight 218). He is on oxygen at 3 L/min with oxygen saturation 96%.  Subjective:   Diabetes mellitus type 2 with neurological manifestations ([place]). Majestic was given steroids in the hospital. He states fasting blood sugars are in the 180's. He reports having increased blood sugars in the 300-400's.  Lab Results  Component Value Date   HGBA1C 6.5 (H) 12/06/2019   HGBA1C 5.6 07/01/2019   HGBA1C 5.9 (H) 04/24/2019   Lab Results  Component Value Date   MICROALBUR 0.6 12/12/2012   LDLCALC 62 07/01/2019   CREATININE 0.77 12/08/2019   Lab Results  Component Value Date   INSULIN 15.3 12/15/2018   INSULIN 29.0 (H) 08/25/2018   Essential hypertension. Julez is taking Norvasc, Coreg, and losartan. This was well controlled while in the hospital. No lightheadedness.  BP Readings from Last 3 Encounters:  12/08/19 132/63  06/30/19 132/78  05/30/19 120/68   Lab Results  Component Value Date   CREATININE 0.77  12/08/2019   CREATININE 0.75 12/07/2019   CREATININE 0.79 12/06/2019   Assessment/Plan:   Diabetes mellitus type 2 with neurological manifestations ([place]). Good blood sugar control is important to decrease the likelihood of diabetic complications such as nephropathy, neuropathy, limb loss, blindness, coronary artery disease, and death. Intensive lifestyle modification including diet, exercise and weight loss are the first line of treatment for diabetes. Anjay will decrease carbohydrates and continue medications as prescribed.  Essential hypertension. Ziya is working on healthy weight loss and exercise to improve blood pressure control. We will watch for signs of hypotension as he continues his lifestyle modifications. He will continue medications as prescribed and will continue to monitor his blood pressure at home.  Class 1 obesity due to excess calories with serious comorbidity and body mass index (BMI) of 31.0 to 31.9 in adult.  Walker is currently in the action stage of change. As such, his goal is to continue with weight loss efforts. He has agreed to the Category 3 Plan.   He will work on meal planning and healthy snacking.  Exercise goals: Older adults should follow the adult guidelines. When older adults cannot meet the adult guidelines, they should be as physically active as their abilities and conditions will allow.   Behavioral modification strategies: increasing lean protein intake, decreasing simple carbohydrates, increasing vegetables, increasing water intake, decreasing eating out, no skipping meals, meal planning and cooking strategies, keeping healthy foods in the home, ways to avoid boredom eating, better snacking choices, emotional eating strategies and planning for success.  Jadeveon has agreed  to follow-up with our clinic in 2-3 weeks. He was informed of the importance of frequent follow-up visits to maximize his success with intensive lifestyle modifications for his multiple  health conditions.  Objective:   VITALS: Per patient if applicable, see vitals. GENERAL: Alert and in no acute distress. CARDIOPULMONARY: No increased WOB. Speaking in clear sentences.  PSYCH: Pleasant and cooperative. Speech normal rate and rhythm. Affect is appropriate. Insight and judgement are appropriate. Attention is focused, linear, and appropriate.  NEURO: Oriented as arrived to appointment on time with no prompting.   Lab Results  Component Value Date   CREATININE 0.77 12/08/2019   BUN 29 (H) 12/08/2019   NA 137 12/08/2019   K 3.6 12/08/2019   CL 100 12/08/2019   CO2 25 12/08/2019   Lab Results  Component Value Date   ALT 31 12/08/2019   AST 20 12/08/2019   ALKPHOS 68 12/08/2019   BILITOT 0.7 12/08/2019   Lab Results  Component Value Date   HGBA1C 6.5 (H) 12/06/2019   HGBA1C 5.6 07/01/2019   HGBA1C 5.9 (H) 04/24/2019   HGBA1C 6.3 (H) 12/15/2018   HGBA1C 7.9 (H) 08/25/2018   Lab Results  Component Value Date   INSULIN 15.3 12/15/2018   INSULIN 29.0 (H) 08/25/2018   Lab Results  Component Value Date   TSH 3.93 07/01/2019   Lab Results  Component Value Date   CHOL 129 07/01/2019   HDL 31.20 (L) 07/01/2019   LDLCALC 62 07/01/2019   LDLDIRECT 104.9 12/12/2012   TRIG 85 12/04/2019   CHOLHDL 4 07/01/2019   Lab Results  Component Value Date   WBC 8.3 12/08/2019   HGB 13.0 12/08/2019   HCT 40.3 12/08/2019   MCV 94.4 12/08/2019   PLT 251 12/08/2019   Lab Results  Component Value Date   FERRITIN 311 12/04/2019   Attestation Statements:   Reviewed by clinician on day of visit: allergies, medications, problem list, medical history, surgical history, family history, social history, and previous encounter notes.  Time spent on visit including pre-visit chart review and post-visit care was 20 minutes.   Migdalia Dk, am acting as Location manager for [employer], DO   I have reviewed the above documentation for accuracy and completeness, and I agree  with the above. Jearld Lesch, DO

## 2019-12-17 ENCOUNTER — Telehealth (INDEPENDENT_AMBULATORY_CARE_PROVIDER_SITE_OTHER): Payer: Medicare Other | Admitting: Podiatry

## 2019-12-17 ENCOUNTER — Encounter: Payer: Self-pay | Admitting: Podiatry

## 2019-12-17 ENCOUNTER — Other Ambulatory Visit: Payer: Self-pay

## 2019-12-17 DIAGNOSIS — M2042 Other hammer toe(s) (acquired), left foot: Secondary | ICD-10-CM

## 2019-12-17 DIAGNOSIS — L84 Corns and callosities: Secondary | ICD-10-CM

## 2019-12-18 NOTE — Progress Notes (Signed)
Virtual Visit via Video Note  I connected with [name] on 12/17/2019 at 12pm EST  by a video enabled telemedicine application and verified that I am speaking with the correct person using two identifiers.  Location: Patient: Home, wife was also present Provider: office   I discussed the limitations of evaluation and management by telemedicine and the availability of in person appointments. The patient expressed understanding and agreed to proceed.  History of Present Illness: 74 year old male with previous hallux amputation developed ulcer of callus to the distal aspect of the second toe.  He previous underwent flexor tenotomy has been doing well.  Since I last saw him he has been diagnosed with COVID and he was admitted to the hospital.  He was discharged home with oxygen.  Due to this he did not like coming to the office.  States the toe is doing well.  No swelling, redness, drainage.  No pain.  No new lesions.  Denies any fevers or chills.    Observations/Objective: Hyperkeratotic lesion of the distal aspect of the second toe without any underlying ulceration identified today.  There is not appear to be any edema, erythema, able to appreciate any drainage.  I do not appreciate any other open lesions.  Assessment and Plan: Preulcerative callus second toe with hammertoe deformity  Continue offloading at all times and small amount of moisturizer daily.  Monitor for any skin breakdown.  Once he left the office next 2 to 3 weeks for follow-up if able.  Follow Up Instructions: See above   I discussed the assessment and treatment plan with the patient. The patient was provided an opportunity to ask questions and all were answered. The patient agreed with the plan and demonstrated an understanding of the instructions.   The patient was advised to call back or seek an in-person evaluation if the symptoms worsen or if the condition fails to improve as anticipated.  I provided 6 minutes  of non-face-to-face time during this encounter.   Trula Slade, DPM

## 2019-12-21 DIAGNOSIS — C4492 Squamous cell carcinoma of skin, unspecified: Secondary | ICD-10-CM | POA: Diagnosis not present

## 2019-12-21 DIAGNOSIS — L409 Psoriasis, unspecified: Secondary | ICD-10-CM | POA: Diagnosis not present

## 2019-12-24 ENCOUNTER — Telehealth (INDEPENDENT_AMBULATORY_CARE_PROVIDER_SITE_OTHER): Payer: Medicare Other | Admitting: Bariatrics

## 2019-12-25 ENCOUNTER — Other Ambulatory Visit: Payer: Self-pay

## 2019-12-25 ENCOUNTER — Ambulatory Visit (INDEPENDENT_AMBULATORY_CARE_PROVIDER_SITE_OTHER): Payer: Medicare Other | Admitting: Family Medicine

## 2019-12-25 DIAGNOSIS — Z6831 Body mass index (BMI) 31.0-31.9, adult: Secondary | ICD-10-CM

## 2019-12-25 DIAGNOSIS — E119 Type 2 diabetes mellitus without complications: Secondary | ICD-10-CM

## 2019-12-25 DIAGNOSIS — E6609 Other obesity due to excess calories: Secondary | ICD-10-CM

## 2019-12-25 DIAGNOSIS — J069 Acute upper respiratory infection, unspecified: Secondary | ICD-10-CM

## 2019-12-25 DIAGNOSIS — U071 COVID-19: Secondary | ICD-10-CM

## 2019-12-25 DIAGNOSIS — D649 Anemia, unspecified: Secondary | ICD-10-CM | POA: Diagnosis not present

## 2019-12-25 DIAGNOSIS — I701 Atherosclerosis of renal artery: Secondary | ICD-10-CM | POA: Diagnosis not present
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
## 2019-12-25 NOTE — Patient Instructions (Signed)
[facility] Blood Pressure cuff, upper arm, want BP 100-140/60-90 Pulse oximeter, want oxygen in 90s  Weekly vitals  Take Multivitamin with minerals, selenium Vitamin D 1000-2000 IU daily Probiotic with lactobacillus and bifidophilus Asprin EC 81 mg daily  Melatonin 2-5 mg at bedtime  [email or web address] 

## 2019-12-27 NOTE — Assessment & Plan Note (Signed)
He is doing much better but he is still on O2 at 3 L. He is not noting any dyspnea at rest and his oxygen is staying int he high 90s. He is encouraged to drop his oxygen to 2 L and monitor his O2 levels. If numbers stay in the 90s maintain oxygen at 2L til seen in office next week. If numbers drop can increase back up to 3 L

## 2019-12-27 NOTE — Assessment & Plan Note (Signed)
hgba1c acceptable, minimize simple carbs. Increase exercise as tolerated. Continue current meds 

## 2019-12-27 NOTE — Assessment & Plan Note (Signed)
Has lost weight as he has struggled with COVID but he notes his taste and smell hve returned and he is eating well

## 2019-12-27 NOTE — Assessment & Plan Note (Signed)
Will check cmp when he is in office next week

## 2019-12-27 NOTE — Assessment & Plan Note (Signed)
Recheck cbc next week. Increase leafy greens, consider increased lean red meat and using cast iron cookware. Continue to monitor, report any concerns

## 2019-12-28 ENCOUNTER — Other Ambulatory Visit: Payer: Self-pay

## 2019-12-28 ENCOUNTER — Encounter (INDEPENDENT_AMBULATORY_CARE_PROVIDER_SITE_OTHER): Payer: Self-pay | Admitting: Family Medicine

## 2019-12-28 ENCOUNTER — Telehealth (INDEPENDENT_AMBULATORY_CARE_PROVIDER_SITE_OTHER): Payer: Medicare Other | Admitting: Family Medicine

## 2019-12-28 DIAGNOSIS — E6609 Other obesity due to excess calories: Secondary | ICD-10-CM

## 2019-12-28 DIAGNOSIS — E119 Type 2 diabetes mellitus without complications: Secondary | ICD-10-CM | POA: Diagnosis not present

## 2019-12-28 DIAGNOSIS — Z6831 Body mass index (BMI) 31.0-31.9, adult: Secondary | ICD-10-CM | POA: Diagnosis not present

## 2019-12-28 DIAGNOSIS — I10 Essential (primary) hypertension: Secondary | ICD-10-CM

## 2019-12-29 NOTE — Progress Notes (Signed)
TeleHealth Visit:  Due to the COVID-19 pandemic, this visit was completed with telemedicine (audio/video) technology to reduce patient and provider exposure as well as to preserve personal protective equipment.   [name] has verbally consented to this TeleHealth visit. The patient is located at home, the provider is located at the [facility] office. The participants in this visit include the listed provider, patient, wife [name]), and any and all parties involved. The visit was conducted today via FaceTime.  Chief Complaint: OBESITY [name] is here to discuss his progress with his obesity treatment plan along with follow-up of his obesity related diagnoses. [name] is on the Category 3 Plan and states he is following his eating plan approximately 60% of the time. [name] states he is exercising 0 minutes 0 times per week.  Today's visit was #: 31 Starting weight: 236 lbs Starting date: 08/25/2018  Interim History: [name] was hospitalized with COVID, the end of January, and he is now on oxygen. His appetite is okay and he has no hunger. [name] is doing sugarless cookies or potato chips or bananas for a snack. He has a weight of 214 pounds this morning (12/28/19). He is getting in four ounces of meat at dinner and he is doing a banana sandwich or cottage cheese at lunch.  Subjective:   Type 2 diabetes mellitus without complication, without long-term current use of insulin ([place]) Cadon last Hgb A1c was at 6.5 on 12/06/19. He is on Glimepiride two times daily and metformin. His blood sugars average approximately 250. Holden denies hypoglycemia.  Lab Results  Component Value Date   HGBA1C 6.5 (H) 12/06/2019   HGBA1C 5.6 07/01/2019   HGBA1C 5.9 (H) 04/24/2019   Lab Results  Component Value Date   MICROALBUR 0.6 12/12/2012   LDLCALC 62 07/01/2019   CREATININE 0.77 12/08/2019   Lab Results  Component Value Date   INSULIN 15.3 12/15/2018   INSULIN 29.0 (H) 08/25/2018    Essential hypertension [place] blood pressure is reported as 128/60. He denies chest pain, chest pressure or headache. Ronon has some shortness of breath since his COVID diagnosis.  BP Readings from Last 3 Encounters:  12/25/19 128/60  12/08/19 132/63  06/30/19 132/78   Lab Results  Component Value Date   CREATININE 0.77 12/08/2019   CREATININE 0.75 12/07/2019   CREATININE 0.79 12/06/2019   Assessment/Plan:   Type 2 diabetes mellitus without complication, without long-term current use of insulin ([place]) We will follow up at the next appointment. There is no change in medications.   Essential hypertension  [place] is working on healthy weight loss and exercise to improve blood pressure control.  We will follow up blood pressure at the next appointment. We will watch for signs of hypotension as he continues his lifestyle modifications.  Class 1 obesity due to excess calories with serious comorbidity and body mass index (BMI) of 31.0 to 31.9 in adult [name] is currently in the action stage of change. As such, his goal is to continue with weight loss efforts. He has agreed to the Category 3 Plan.   Exercise goals: All adults should avoid inactivity. Some physical activity is better than none, and adults who participate in any amount of physical activity gain some health benefits.  Behavioral modification strategies: increasing lean protein intake, increasing vegetables, meal planning and cooking strategies, keeping healthy foods in the home and planning for success.  [name] has agreed to follow-up with our clinic in 2 weeks. He was informed  of the importance of frequent follow-up visits to maximize his success with intensive lifestyle modifications for his multiple health conditions.  Objective:   VITALS: Per patient if applicable, see vitals. GENERAL: Alert and in no acute distress. CARDIOPULMONARY: No increased WOB. Speaking in clear sentences.  PSYCH: Pleasant and cooperative. Speech  normal rate and rhythm. Affect is appropriate. Insight and judgement are appropriate. Attention is focused, linear, and appropriate.  NEURO: Oriented as arrived to appointment on time with no prompting.   Lab Results  Component Value Date   CREATININE 0.77 12/08/2019   BUN 29 (H) 12/08/2019   NA 137 12/08/2019   K 3.6 12/08/2019   CL 100 12/08/2019   CO2 25 12/08/2019   Lab Results  Component Value Date   ALT 31 12/08/2019   AST 20 12/08/2019   ALKPHOS 68 12/08/2019   BILITOT 0.7 12/08/2019   Lab Results  Component Value Date   HGBA1C 6.5 (H) 12/06/2019   HGBA1C 5.6 07/01/2019   HGBA1C 5.9 (H) 04/24/2019   HGBA1C 6.3 (H) 12/15/2018   HGBA1C 7.9 (H) 08/25/2018   Lab Results  Component Value Date   INSULIN 15.3 12/15/2018   INSULIN 29.0 (H) 08/25/2018   Lab Results  Component Value Date   TSH 3.93 07/01/2019   Lab Results  Component Value Date   CHOL 129 07/01/2019   HDL 31.20 (L) 07/01/2019   LDLCALC 62 07/01/2019   LDLDIRECT 104.9 12/12/2012   TRIG 85 12/04/2019   CHOLHDL 4 07/01/2019   Lab Results  Component Value Date   WBC 8.3 12/08/2019   HGB 13.0 12/08/2019   HCT 40.3 12/08/2019   MCV 94.4 12/08/2019   PLT 251 12/08/2019   Lab Results  Component Value Date   FERRITIN 311 12/04/2019    Ref. Range 08/25/2018 11:20  Vitamin D, 25-Hydroxy Latest Ref Range: 30.0 - 100.0 ng/mL 41.2    Attestation Statements:   Reviewed by clinician on day of visit: allergies, medications, problem list, medical history, surgical history, family history, social history, and previous encounter notes.  I, Doreene Nest, am acting as transcriptionist for Coralie Common, MD. I have reviewed the above documentation for accuracy and completeness, and I agree with the above. - Ilene Qua, MD D

## 2019-12-31 ENCOUNTER — Ambulatory Visit: Payer: Medicare Other | Admitting: Family Medicine

## 2020-01-04 ENCOUNTER — Telehealth (HOSPITAL_COMMUNITY): Payer: Self-pay

## 2020-01-04 ENCOUNTER — Other Ambulatory Visit: Payer: Self-pay

## 2020-01-04 ENCOUNTER — Ambulatory Visit (INDEPENDENT_AMBULATORY_CARE_PROVIDER_SITE_OTHER): Payer: Medicare Other | Admitting: Family Medicine

## 2020-01-04 VITALS — BP 144/60 | HR 79 | Temp 96.9°F | Resp 16 | Ht 69.0 in | Wt 221.0 lb

## 2020-01-04 DIAGNOSIS — E1149 Type 2 diabetes mellitus with other diabetic neurological complication: Secondary | ICD-10-CM | POA: Diagnosis not present

## 2020-01-04 DIAGNOSIS — I10 Essential (primary) hypertension: Secondary | ICD-10-CM

## 2020-01-04 DIAGNOSIS — D649 Anemia, unspecified: Secondary | ICD-10-CM | POA: Diagnosis not present

## 2020-01-04 DIAGNOSIS — U071 COVID-19: Secondary | ICD-10-CM | POA: Diagnosis not present

## 2020-01-04 DIAGNOSIS — N289 Disorder of kidney and ureter, unspecified: Secondary | ICD-10-CM | POA: Diagnosis not present

## 2020-01-04 NOTE — Assessment & Plan Note (Signed)
hydratwe and monitor

## 2020-01-04 NOTE — Assessment & Plan Note (Signed)
Well controlled, no changes to meds. Encouraged heart healthy diet such as the DASH diet and exercise as tolerated.  °
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
## 2020-01-04 NOTE — Patient Instructions (Signed)
Omron Blood Pressure cuff, upper arm, want BP 100-140/60-90 Pulse oximeter, want oxygen in 90s  Weekly vitals  Take Multivitamin with minerals, selenium Vitamin D 1000-2000 IU daily Probiotic with lactobacillus and bifidophilus Asprin EC 81 mg daily  Melatonin 2-5 mg at bedtime  [email or web address] 

## 2020-01-04 NOTE — Assessment & Plan Note (Signed)
He is now home with his wife but is frustrated with his slow progess and need for oxygen supplementation. In office just while sitting during exam without oxygen his O2 sats dropped as low as 83% when he put the O2 back on at 2L his O2 was yo ti 95 and 06 % again. Will maintain him on O2 for now.

## 2020-01-04 NOTE — Assessment & Plan Note (Signed)
hgba1c acceptable, minimize simple carbs. Increase exercise as tolerated. Continue current meds 

## 2020-01-04 NOTE — Assessment & Plan Note (Signed)
Check CBC today.  

## 2020-01-04 NOTE — Telephone Encounter (Signed)
The above patient or their representative was contacted and gave the following answers to these questions:         Do you have any of the following symptoms?    NO  Fever                    Cough                   Shortness of breath  Do  you have any of the following other symptoms?    muscle pain         vomiting,        diarrhea        rash         weakness        red eye        abdominal pain         bruising          bruising or bleeding              joint pain           severe headache    Have you been in contact with someone who was or has been sick in the past 2 weeks?  NO  Yes                 Unsure                         Unable to assess   Does the person that you were in contact with have any of the following symptoms?   Cough         shortness of breath           muscle pain         vomiting,            diarrhea            rash            weakness           fever            red eye           abdominal pain           bruising  or  bleeding                joint pain                severe headache                 COMMENTS OR ACTION PLAN FOR THIS PATIENT:        ALL QUESTIONS WERE ANSWERED BY PT'S WIFE ( [name])/PT HAD [place] 12/08/2019 WHICH IS PAST THE 21 DAY MARK/[facility]

## 2020-01-05 ENCOUNTER — Ambulatory Visit (INDEPENDENT_AMBULATORY_CARE_PROVIDER_SITE_OTHER)
Admission: RE | Admit: 2020-01-05 | Discharge: 2020-01-05 | Disposition: A | Discharge status: Home / Self Care | Payer: Medicare Other | Source: Ambulatory Visit | Attending: Surgery | Admitting: Surgery

## 2020-01-05 ENCOUNTER — Other Ambulatory Visit: Payer: Self-pay

## 2020-01-05 ENCOUNTER — Ambulatory Visit (INDEPENDENT_AMBULATORY_CARE_PROVIDER_SITE_OTHER): Payer: Medicare Other | Admitting: Podiatry

## 2020-01-05 ENCOUNTER — Ambulatory Visit (HOSPITAL_COMMUNITY)
Admission: RE | Admit: 2020-01-05 | Discharge: 2020-01-05 | Disposition: A | Discharge status: Home / Self Care | Payer: Medicare Other | Source: Ambulatory Visit | Attending: Surgery | Admitting: Surgery

## 2020-01-05 ENCOUNTER — Encounter: Payer: Self-pay | Admitting: Vascular Surgery

## 2020-01-05 ENCOUNTER — Ambulatory Visit: Payer: Medicare Other | Admitting: Vascular Surgery

## 2020-01-05 VITALS — BP 108/63 | HR 73 | Temp 97.2°F | Resp 18 | Ht 69.0 in | Wt 214.0 lb

## 2020-01-05 DIAGNOSIS — I771 Stricture of artery: Secondary | ICD-10-CM

## 2020-01-05 DIAGNOSIS — Z8679 Personal history of other diseases of the circulatory system: Secondary | ICD-10-CM | POA: Diagnosis not present

## 2020-01-05 DIAGNOSIS — I70229 Atherosclerosis of native arteries of extremities with rest pain, unspecified extremity: Secondary | ICD-10-CM

## 2020-01-05 DIAGNOSIS — I998 Other disorder of circulatory system: Secondary | ICD-10-CM

## 2020-01-05 DIAGNOSIS — J9601 Acute respiratory failure with hypoxia: Secondary | ICD-10-CM | POA: Diagnosis not present

## 2020-01-05 DIAGNOSIS — M2042 Other hammer toe(s) (acquired), left foot: Secondary | ICD-10-CM

## 2020-01-05 DIAGNOSIS — J449 Chronic obstructive pulmonary disease, unspecified: Secondary | ICD-10-CM | POA: Diagnosis not present

## 2020-01-05 DIAGNOSIS — L84 Corns and callosities: Secondary | ICD-10-CM
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
## 2020-01-06 ENCOUNTER — Other Ambulatory Visit: Payer: Self-pay | Admitting: *Deleted

## 2020-01-06 DIAGNOSIS — I739 Peripheral vascular disease, unspecified: Secondary | ICD-10-CM

## 2020-01-06 DIAGNOSIS — I771 Stricture of artery: Secondary | ICD-10-CM

## 2020-01-08 NOTE — Progress Notes (Signed)
Subjective: 74 year old male presents the office today for follow-up evaluation of preulcerative callus to the left second toe, hammertoe deformity.  He states that he is doing well.He still on oxygen after recovering from COVID, pneumonia. His wife is pain applying moisturizer to his feet and trying to get the callus off.  She did cut the skin slightly but overall improved.  Denies any open sores currently.  No other concerns.  No increase in swelling to the foot no redness or warmth. Denies any systemic complaints such as fevers, chills, nausea, vomiting. No acute changes since last appointment, and no other complaints at this time.   Objective: AAO x3, NAD DP/PT pulses palpable bilaterally, CRT less than 3 seconds Hammertoe contractures are present.  Previous hallux amputation on the left side.  There is currently no open lesions.  There is a small area with the skin peeled on the plantar foot on trying to get some callus off but there is no open sores.  There is no pain in the foot there is no significant edema, erythema. No open lesions or pre-ulcerative lesions.  No pain with calf compression, swelling, warmth, erythema  Assessment: Dry skin with hammertoe deformity  Plan: -All treatment options discussed with the patient including all alternatives, risks, complications.  -At this time there is no evidence of ulceration.  There is no significant callus formation.  Discussed trying to be careful not to peel the skin off.  Continue moisturizer daily and offloading.  Monitoring skin breakdown or any signs or symptoms of infection. -Patient encouraged to call the office with any questions, concerns, change in symptoms.   [name]

## 2020-01-09 ENCOUNTER — Other Ambulatory Visit: Payer: Self-pay | Admitting: Family Medicine

## 2020-01-09 ENCOUNTER — Encounter: Payer: Self-pay | Admitting: Family Medicine

## 2020-01-11 ENCOUNTER — Other Ambulatory Visit: Payer: Self-pay

## 2020-01-11 ENCOUNTER — Emergency Department (HOSPITAL_COMMUNITY)
Admission: EM | Admit: 2020-01-11 | Discharge: 2020-01-11 | Disposition: A | Discharge status: Home / Self Care | Payer: Medicare Other | Attending: Emergency Medicine | Admitting: Emergency Medicine

## 2020-01-11 ENCOUNTER — Encounter (HOSPITAL_COMMUNITY): Payer: Self-pay

## 2020-01-11 DIAGNOSIS — T24291A Burn of second degree of multiple sites of right lower limb, except ankle and foot, initial encounter: Secondary | ICD-10-CM | POA: Diagnosis not present

## 2020-01-11 DIAGNOSIS — Y999 Unspecified external cause status: Secondary | ICD-10-CM | POA: Insufficient documentation

## 2020-01-11 DIAGNOSIS — I10 Essential (primary) hypertension: Secondary | ICD-10-CM | POA: Insufficient documentation

## 2020-01-11 DIAGNOSIS — Y929 Unspecified place or not applicable: Secondary | ICD-10-CM | POA: Diagnosis not present

## 2020-01-11 DIAGNOSIS — Z7984 Long term (current) use of oral hypoglycemic drugs: Secondary | ICD-10-CM | POA: Insufficient documentation

## 2020-01-11 DIAGNOSIS — E119 Type 2 diabetes mellitus without complications: Secondary | ICD-10-CM | POA: Insufficient documentation

## 2020-01-11 DIAGNOSIS — Z87891 Personal history of nicotine dependence: Secondary | ICD-10-CM | POA: Insufficient documentation

## 2020-01-11 DIAGNOSIS — J449 Chronic obstructive pulmonary disease, unspecified: Secondary | ICD-10-CM | POA: Diagnosis not present

## 2020-01-11 DIAGNOSIS — Z79899 Other long term (current) drug therapy: Secondary | ICD-10-CM | POA: Diagnosis not present

## 2020-01-11 DIAGNOSIS — Y939 Activity, unspecified: Secondary | ICD-10-CM | POA: Insufficient documentation

## 2020-01-11 DIAGNOSIS — X010XXA Exposure to flames in uncontrolled fire, not in building or structure, initial encounter: Secondary | ICD-10-CM | POA: Diagnosis not present

## 2020-01-11 DIAGNOSIS — Z1211 Encounter for screening for malignant neoplasm of colon: Secondary | ICD-10-CM | POA: Diagnosis not present

## 2020-01-11 DIAGNOSIS — Z1212 Encounter for screening for malignant neoplasm of rectum: Secondary | ICD-10-CM | POA: Diagnosis not present

## 2020-01-11 DIAGNOSIS — T25311A Burn of third degree of right ankle, initial encounter: Secondary | ICD-10-CM

## 2020-01-11 LAB — COLOGUARD: Cologuard: NEGATIVE

## 2020-01-11 MED ORDER — SILVER SULFADIAZINE 1 % EX CREA
TOPICAL_CREAM | Freq: Once | CUTANEOUS | Status: AC
  Filled 2020-01-11: qty 85

## 2020-01-11 MED ORDER — GLIMEPIRIDE 2 MG PO TABS: 2.0000 mg | ORAL_TABLET | Freq: Three times a day (TID) | ORAL | 1 refills | Status: DC
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
## 2020-01-11 NOTE — Discharge Instructions (Signed)
Keep the wounds clean and dry.  Apply Silvadene cream then Xeroform gauze then wrap in regular gauze and Kerlex daily.  Change the dressing daily.  You can shower and gently wash the wounds with Dove or Dial antibacterial soap.  You can take 1 to 2 tablets of Tylenol every 6 hours as needed for pain.  I spoke with the PA working with Dr. Claudia Desanctis and they both reviewed your images.  They recommend calling the office tomorrow to schedule an appointment for Wednesday or Thursday of this week.  Return to the emergency department immediately if any concerning signs or symptoms develop such as severe swelling, fevers, streaking of redness up the leg, abnormal drainage or other concerning findings.
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
## 2020-01-11 NOTE — ED Notes (Signed)
Patient verbalizes understanding of discharge instructions. Opportunity for questioning and answers were provided. Armband removed by staff, pt discharged from ED. Pt. ambulatory and discharged home.  

## 2020-01-12 ENCOUNTER — Encounter (INDEPENDENT_AMBULATORY_CARE_PROVIDER_SITE_OTHER): Payer: Self-pay | Admitting: Family Medicine

## 2020-01-12 ENCOUNTER — Telehealth (INDEPENDENT_AMBULATORY_CARE_PROVIDER_SITE_OTHER): Payer: Medicare Other | Admitting: Family Medicine

## 2020-01-12 DIAGNOSIS — F418 Other specified anxiety disorders: Secondary | ICD-10-CM | POA: Diagnosis not present

## 2020-01-12 DIAGNOSIS — E1149 Type 2 diabetes mellitus with other diabetic neurological complication: Secondary | ICD-10-CM | POA: Diagnosis not present

## 2020-01-12 DIAGNOSIS — Z6831 Body mass index (BMI) 31.0-31.9, adult: Secondary | ICD-10-CM

## 2020-01-12 DIAGNOSIS — E6609 Other obesity due to excess calories: Secondary | ICD-10-CM | POA: Diagnosis not present

## 2020-01-12 MED ORDER — BUPROPION HCL ER (SR) 200 MG PO TB12: 200.0000 mg | ORAL_TABLET | Freq: Two times a day (BID) | ORAL | 0 refills | Status: DC
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
## 2020-01-13 ENCOUNTER — Other Ambulatory Visit: Payer: Self-pay | Admitting: Family Medicine

## 2020-01-13 ENCOUNTER — Encounter: Payer: Self-pay | Admitting: Plastic Surgery

## 2020-01-13 ENCOUNTER — Other Ambulatory Visit: Payer: Self-pay

## 2020-01-13 ENCOUNTER — Ambulatory Visit: Payer: Medicare Other | Admitting: Plastic Surgery

## 2020-01-13 VITALS — BP 128/62 | HR 76 | Temp 97.3°F | Ht 69.0 in | Wt 224.6 lb

## 2020-01-13 DIAGNOSIS — T300 Burn of unspecified body region, unspecified degree: Secondary | ICD-10-CM
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
## 2020-01-14 ENCOUNTER — Telehealth: Payer: Self-pay | Admitting: Cardiovascular Disease

## 2020-01-14 ENCOUNTER — Other Ambulatory Visit: Payer: Self-pay | Admitting: Family Medicine

## 2020-01-14 DIAGNOSIS — D649 Anemia, unspecified: Secondary | ICD-10-CM

## 2020-01-14 DIAGNOSIS — U071 COVID-19: Secondary | ICD-10-CM

## 2020-01-14 DIAGNOSIS — E1149 Type 2 diabetes mellitus with other diabetic neurological complication: Secondary | ICD-10-CM

## 2020-01-14 DIAGNOSIS — I10 Essential (primary) hypertension: Secondary | ICD-10-CM

## 2020-01-14 DIAGNOSIS — L97529 Non-pressure chronic ulcer of other part of left foot with unspecified severity: Secondary | ICD-10-CM | POA: Diagnosis not present

## 2020-01-14 DIAGNOSIS — E785 Hyperlipidemia, unspecified: Secondary | ICD-10-CM

## 2020-01-14 NOTE — Telephone Encounter (Signed)
   [facility] Pre-operative Risk Assessment    Request for surgical clearance:  1. What type of surgery is being performed? debridement right ankle bone / skin graft split thickness application of wound vac   2. When is this surgery scheduled? 01/19/20 at 8am   3. What type of clearance is required (medical clearance vs. Pharmacy clearance to hold med vs. Both)? both  4. Are there any medications that need to be held prior to surgery and how long? Plavix - unsure how long..clearance sent to PCP as well.   5. Practice name and name of physician performing surgery? Dr. Mingo Amber / [facility]  6. What is your office phone number [phone]    7.   What is your office fax number [phone]  8.   Anesthesia type (None, local, MAC, general) ? General   Leah Newnam 01/14/2020, 4:21 PM  _________________________________________________________________   (provider comments below)

## 2020-01-14 NOTE — Telephone Encounter (Signed)
Angie from [facility] called back because she states that Dr. Penni Homans would like Dr. Gwenlyn Found to give the clearance for Plavix.

## 2020-01-14 NOTE — Telephone Encounter (Signed)
Spoke with pt who is agreeable to see Donald Dopp, PA-C virtually on 3/15 at 8:45 am. Pt thanked me for the call     Virtual Visit Pre-Appointment Phone Call  "(Name), I am calling you today to discuss your upcoming appointment. We are currently trying to limit exposure to the virus that causes COVID-19 by seeing patients at home rather than in the office."  1. "What is the BEST phone number to call the day of the visit?" - include this in appointment notes  2. "Do you have or have access to (through a family member/friend) a smartphone with video capability that we can use for your visit?" a. If yes - list this number in appt notes as "cell" (if different from BEST phone #) and list the appointment type as a VIDEO visit in appointment notes b. If no - list the appointment type as a PHONE visit in appointment notes  3. Confirm consent - "In the setting of the current Covid19 crisis, you are scheduled for a (phone or video) visit with your provider on (date) at (time).  Just as we do with many in-office visits, in order for you to participate in this visit, we must obtain consent.  If you'd like, I can send this to your mychart (if signed up) or email for you to review.  Otherwise, I can obtain your verbal consent now.  All virtual visits are billed to your insurance company just like a normal visit would be.  By agreeing to a virtual visit, we'd like you to understand that the technology does not allow for your provider to perform an examination, and thus may limit your provider's ability to fully assess your condition. If your provider identifies any concerns that need to be evaluated in person, we will make arrangements to do so.  Finally, though the technology is pretty good, we cannot assure that it will always work on either your or our end, and in the setting of a video visit, we may have to convert it to a phone-only visit.  In either situation, we cannot ensure that we have a secure  connection.  Are you willing to proceed?" STAFF: Did the patient verbally acknowledge consent to telehealth visit? Document YES/NO here: yes  4. Advise patient to be prepared - "Two hours prior to your appointment, go ahead and check your blood pressure, pulse, oxygen saturation, and your weight (if you have the equipment to check those) and write them all down. When your visit starts, your provider will ask you for this information. If you have an Apple Watch or Kardia device, please plan to have heart rate information ready on the day of your appointment. Please have a pen and paper handy nearby the day of the visit as well."  5. Give patient instructions for MyChart download to smartphone OR Doximity/Doxy.me as below if video visit (depending on what platform provider is using)  6. Inform patient they will receive a phone call 15 minutes prior to their appointment time (may be from unknown caller ID) so they should be prepared to answer    TELEPHONE CALL NOTE  [name] has been deemed a candidate for a follow-up tele-health visit to limit community exposure during the Covid-19 pandemic. I spoke with the patient via phone to ensure availability of phone/video source, confirm preferred email & phone number, and discuss instructions and expectations.  I reminded [name] to be prepared with any vital sign and/or heart rhythm information that  could potentially be obtained via home monitoring, at the time of his visit. I reminded [name] to expect a phone call prior to his visit.  Jacinta Shoe, CMA 01/14/2020 5:11 PM   INSTRUCTIONS FOR DOWNLOADING THE MYCHART APP TO SMARTPHONE  - The patient must first make sure to have activated MyChart and know their login information - If Apple, go to [employer] and type in MyChart in the search bar and download the app. If Android, ask patient to go to [employer] and type in [place] in the search bar and download the app. The app is  free but as with any other app downloads, their phone may require them to verify saved payment information or Apple/Android password.  - The patient will need to then log into the app with their MyChart username and password, and select [facility] as their healthcare provider to link the account. When it is time for your visit, go to the MyChart app, find appointments, and click Begin Video Visit. Be sure to Select Allow for your device to access the Microphone and Camera for your visit. You will then be connected, and your provider will be with you shortly.  **If they have any issues connecting, or need assistance please contact MyChart service desk [phone])**  **If using a computer, in order to ensure the best quality for their visit they will need to use either of the following Internet Browsers: [employer], or Google Chrome**  IF USING DOXIMITY or DOXY.ME - The patient will receive a link just prior to their visit by text.     FULL LENGTH CONSENT FOR TELE-HEALTH VISIT   I hereby voluntarily request, consent and authorize [facility] and its employed or contracted physicians, physician assistants, nurse practitioners or other licensed health care professionals (the Practitioner), to provide me with telemedicine health care services (the "Services") as deemed necessary by the treating Practitioner. I acknowledge and consent to receive the Services by the Practitioner via telemedicine. I understand that the telemedicine visit will involve communicating with the Practitioner through live audiovisual communication technology and the disclosure of certain medical information by electronic transmission. I acknowledge that I have been given the opportunity to request an in-person assessment or other available alternative prior to the telemedicine visit and am voluntarily participating in the telemedicine visit.  I understand that I have the right to withhold or withdraw my  consent to the use of telemedicine in the course of my care at any time, without affecting my right to future care or treatment, and that the Practitioner or I may terminate the telemedicine visit at any time. I understand that I have the right to inspect all information obtained and/or recorded in the course of the telemedicine visit and may receive copies of available information for a reasonable fee.  I understand that some of the potential risks of receiving the Services via telemedicine include:  [name] Delay or interruption in medical evaluation due to technological equipment failure or disruption; . Information transmitted may not be sufficient (e.g. poor resolution of images) to allow for appropriate medical decision making by the Practitioner; and/or  . In rare instances, security protocols could fail, causing a breach of personal health information.  Furthermore, I acknowledge that it is my responsibility to provide information about my medical history, conditions and care that is complete and accurate to the best of my ability. I acknowledge that Practitioner's advice, recommendations, and/or decision may be based on factors not  within their control, such as incomplete or inaccurate data provided by me or distortions of diagnostic images or specimens that may result from electronic transmissions. I understand that the practice of medicine is not an exact science and that Practitioner makes no warranties or guarantees regarding treatment outcomes. I acknowledge that I will receive a copy of this consent concurrently upon execution via email to the email address I last provided but may also request a printed copy by calling the office of [facility].    I understand that my insurance will be billed for this visit.   I have read or had this consent read to me. . I understand the contents of this consent, which adequately explains the benefits and risks of the Services being provided via telemedicine.   . I have been provided ample opportunity to ask questions regarding this consent and the Services and have had my questions answered to my satisfaction. . I give my informed consent for the services to be provided through the use of telemedicine in my medical care  By participating in this telemedicine visit I agree to the above.

## 2020-01-14 NOTE — Telephone Encounter (Signed)
   Primary Cardiologist:Jonathan Gwenlyn Found, MD  Chart reviewed as part of pre-operative protocol coverage. Because of [name]'s past medical history and time since last visit, he/she will require a follow-up visit in order to better assess preoperative cardiovascular risk.  Pt last seen 10/2018.  Pre-op covering staff: - Please schedule appointment and call patient to inform them. - Please contact requesting surgeon's office via preferred method (i.e, phone, fax) to inform them of need for appointment prior to surgery.  If applicable, this message will also be routed to pharmacy pool and/or primary cardiologist for input on holding anticoagulant/antiplatelet agent as requested below so that this information is available at time of patient's appointment.   Daune Perch, NP  01/14/2020, 4:46 PM

## 2020-01-15 ENCOUNTER — Telehealth: Payer: Self-pay | Admitting: Plastic Surgery

## 2020-01-15 ENCOUNTER — Encounter (HOSPITAL_COMMUNITY): Payer: Self-pay | Admitting: Plastic Surgery

## 2020-01-15 ENCOUNTER — Other Ambulatory Visit: Payer: Self-pay

## 2020-01-15 LAB — COLOGUARD: COLOGUARD: NEGATIVE

## 2020-01-15 NOTE — Telephone Encounter (Signed)
This pt is planned for debridement right ankle bone / skin graft split thickness application of wound vac on 3/16. He has an appointment for clearance on 3/15 (pt has not been seen in the office in over a year). The [facility] office is requesting to be able to hold Plavix prior to the procedure, which would need to start today.  Dr. Gwenlyn Found is on vacation, I will reach out to Dr. Fletcher Anon for input on holding Plavix.

## 2020-01-15 NOTE — Telephone Encounter (Signed)
Actually this patient has been followed by [facility], Dr. Carlis Abbott, since last seen in our office and had iliac artery stents placed for high grade common iliac stenosis with non healing foot wound in 05/2019. He had recent office follow up with [facility] on 01/05/20. I called Angie and advised her to check with [facility] regarding holding plavix for procedure. She stated that she would.

## 2020-01-15 NOTE — Telephone Encounter (Signed)
Received call from [facility] at Vascular Surgery, per PA for Dr. Carlis Abbott, ok to hold Plavix 5-7 days before surgery. Surgery scheduled for 3/16 as of right now. Patient was informed by me to not take Plavix starting tomorrow, since he usually takes this medication with breakfast. The patient expressed understanding and will hold the Plavix until after surgery and safe to resume by physicians.

## 2020-01-15 NOTE — Progress Notes (Signed)
Spoke with pt for pre-op call. Pt sees Dr. Gwenlyn Found for an Abdominal Aortic Aneurysm. Denies any other cardiac history. Pt will see the PA on Monday, 01/18/20 for cardiac clearance. Pt is on Plavix and Aspirin by [facility] and they have instructed him to hold his Plavix as of today prior to surgery, last dose is today. He states that he was not instructed to stop his Aspirin.   Pt tested positive for Covid on 11/27/19 and was admitted to the hospital. He will not need to be tested prior to surgery.   Pt is a type 2 diabetic, last A1C was 6.5 on 12/06/19. He states he has not been checking his blood sugar for several weeks. Instructed pt to hold his evening dose of Glimepiride Monday and not to take it morning of surgery. Instructed pt to check his blood sugar when he gets up Tuesday AM. If blood sugar is 70 or below, treat with 1/2 cup of clear juice (apple or cranberry) and recheck blood sugar 15 minutes after drinking juice. Instructed pt to let nurse know on arrival if he had to treat a low blood sugar.
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
## 2020-01-18 ENCOUNTER — Telehealth: Payer: Self-pay | Admitting: *Deleted

## 2020-01-18 ENCOUNTER — Other Ambulatory Visit: Payer: Self-pay

## 2020-01-18 ENCOUNTER — Telehealth (INDEPENDENT_AMBULATORY_CARE_PROVIDER_SITE_OTHER): Payer: Medicare Other | Admitting: Physician Assistant

## 2020-01-18 ENCOUNTER — Encounter: Payer: Self-pay | Admitting: Physician Assistant

## 2020-01-18 VITALS — BP 122/60 | HR 76 | Ht 69.0 in | Wt 224.0 lb

## 2020-01-18 DIAGNOSIS — Z01810 Encounter for preprocedural cardiovascular examination: Secondary | ICD-10-CM | POA: Diagnosis not present

## 2020-01-18 NOTE — Telephone Encounter (Signed)
Received request for Surgical Clearance back from patient's PCP.  Stating patient recently seen and evaluated by PCP is cleared medically pending cardiac clearance he has been referred to Cardiology for evaluation.//AB/CMA
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
## 2020-01-18 NOTE — Telephone Encounter (Signed)
Called patient's PCP ([name]) on (01/14/20) and spoke with [place] and was informed that per [name] the patient will need clearance from his Cardiologist (Dr. Quay Burow).  Rod Holler stated that the patient has not been seen by Dr. Gwenlyn Found since (10/28/18),so a referral was placed to Dr. Gwenlyn Found.    Asked Rod Holler about the instruction for the Plavix, and she stated that per [name] that will also need to be addressed by Dr. Gwenlyn Found.     Called Dr. Kennon Holter office and spoke with Modena Nunnery regarding the clearance, and she stated that she didn't see that a clearance was faxed, so she took the information over the phone for the clearance.  Lea stated we should hear back from them by Friday (01/15/20)./AB/CMA

## 2020-01-18 NOTE — Telephone Encounter (Signed)
Will be using the Prevena wound vac.//AB/CMA

## 2020-01-18 NOTE — Telephone Encounter (Signed)
-----   Message from Mitchell sent at 01/14/2020 10:49 AM EST ----- Regarding: surgery and VAC I see the clearance that was faxed to PCP and Cardiology for this patient. Can we get an update on the status? I have scheduled the surgery for 3/16 at 8 am for [facility].   VAC on surgery route - do we have this?  CAM boot - patient has a CAM boot from a previous surgery. I advised to bring with him to the OR and if it doesn't work, we are checking to make sure the hospital has one that can be placed if needed. Otherwise, it would be better from an insurance standpoint to write an order for DME to a supply store; patient will pay less in co-insurance versus an OOP cost to [employer], etc.   Please let me know that status of the PCP and Cards clearance once we have more information.   Thank you!

## 2020-01-18 NOTE — Patient Instructions (Signed)
Medication Instructions:   Your physician recommends that you continue on your current medications as directed. Please refer to the Current Medication list given to you today.  *If you need a refill on your cardiac medications before your next appointment, please call your pharmacy*  Lab Work:  None ordered today  If you have labs (blood work) drawn today and your tests are completely normal, you will receive your results only by: [name] MyChart Message (if you have MyChart) OR . A paper copy in the mail If you have any lab test that is abnormal or we need to change your treatment, we will call you to review the results.  Testing/Procedures:  None ordered today  Follow-Up: At [facility], you and your health needs are our priority.  As part of our continuing mission to provide you with exceptional heart care, we have created designated Provider Care Teams.  These Care Teams include your primary Cardiologist (physician) and Advanced Practice Providers (APPs -  Physician Assistants and Nurse Practitioners) who all work together to provide you with the care you need, when you need it.  We recommend signing up for the patient portal called "MyChart".  Sign up information is provided on this After Visit Summary.  MyChart is used to connect with patients for Virtual Visits (Telemedicine).  Patients are able to view lab/test results, encounter notes, upcoming appointments, etc.  Non-urgent messages can be sent to your provider as well.   To learn more about what you can do with MyChart, go to [email or web address].    Your next appointment:   9 month(s)  The format for your next appointment:   In Person  Provider:   Quay Burow, MD
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
## 2020-01-18 NOTE — Telephone Encounter (Signed)
Called Dr. Kennon Holter office back on (01/15/20) to follow-up on the Surgical Clearance.  Spoke with Lea and was informed that the patient will need an appointment, and he was scheduled with Richardson Dopp on (Mon 01/18/20 @ 8:45am).  Asked about the instruction for the Plavix.  If it needed to be stopped, how long, and if a bridge is needed.  Was informed they would give me a call back.     Called [facility] back to follow-up on the instructions for the Plavix and spoke with Nia and she informed me that Dr. Gwenlyn Found did not prescribe the Plavix.  She stated that the Plavix was prescribed by Dr. Retta Mac Surg.  She said the patient was placed on the Plavix after having stents placed on (05/28/19) by Dr. Carlis Abbott.  The Plavix is used for peripheral and not cardiac.     Called [facility] and spoke with Larene Beach.  I informed her of the Surgical Clearance we are getting on the patient.  Informed her that we needed instructions for the Plavix that the patient is on.  She looked into the patient's chart and stated that the patient's PCP was filling the Plavix, and they go by who fills the medication.  Informed her that I spoke with the patient's PCP and she stated that Dr. Gwenlyn Found need to manage the Plavix, and Dr. Kennon Holter office stated that Dr. Carlis Abbott will need to manage it because the patient was placed on it because of the stents that were placed.  Larene Beach stated Dr. Carlis Abbott was not in the office today, but she will speak with one of the PA's in the office and give [name] a call back.  She informed me that she will not be able to speak with the PA until after the finish seeing patient's.  Informed her that would be fine.   Called Shannon back and asked her to give [facility] our scheduler a call back when she's able to get an answer from the PA.  She verbalized understanding and agreed.//AB/CMA   Received message back from [facility] stating she received an answer back regarding the Plavix from Dr. Ainsley Spinner office.  Okay to  hold Plavix 5-7 days before surgery.  Frances Furbish stated she will call the patient and let him know.//AB/CMA

## 2020-01-19 ENCOUNTER — Telehealth: Payer: Self-pay | Admitting: Plastic Surgery

## 2020-01-19 ENCOUNTER — Encounter (HOSPITAL_COMMUNITY)
Admission: AD | Disposition: A | Discharge status: Home / Self Care | Payer: Self-pay | Source: Home / Self Care | Attending: Plastic Surgery

## 2020-01-19 ENCOUNTER — Other Ambulatory Visit: Payer: Self-pay

## 2020-01-19 ENCOUNTER — Observation Stay (HOSPITAL_COMMUNITY)
Admission: AD | Admit: 2020-01-19 | Discharge: 2020-01-20 | Disposition: A | Discharge status: Home / Self Care | Payer: Medicare Other | Attending: Plastic Surgery | Admitting: Plastic Surgery

## 2020-01-19 ENCOUNTER — Ambulatory Visit (HOSPITAL_COMMUNITY): Payer: Medicare Other | Admitting: Vascular Surgery

## 2020-01-19 ENCOUNTER — Encounter (HOSPITAL_COMMUNITY): Payer: Self-pay | Admitting: Plastic Surgery

## 2020-01-19 DIAGNOSIS — Z8673 Personal history of transient ischemic attack (TIA), and cerebral infarction without residual deficits: Secondary | ICD-10-CM | POA: Insufficient documentation

## 2020-01-19 DIAGNOSIS — T25019A Burn of unspecified degree of unspecified ankle, initial encounter: Secondary | ICD-10-CM | POA: Diagnosis present

## 2020-01-19 DIAGNOSIS — Z79899 Other long term (current) drug therapy: Secondary | ICD-10-CM | POA: Insufficient documentation

## 2020-01-19 DIAGNOSIS — K219 Gastro-esophageal reflux disease without esophagitis: Secondary | ICD-10-CM | POA: Diagnosis not present

## 2020-01-19 DIAGNOSIS — Z961 Presence of intraocular lens: Secondary | ICD-10-CM | POA: Insufficient documentation

## 2020-01-19 DIAGNOSIS — Z87891 Personal history of nicotine dependence: Secondary | ICD-10-CM | POA: Insufficient documentation

## 2020-01-19 DIAGNOSIS — T300 Burn of unspecified body region, unspecified degree: Secondary | ICD-10-CM | POA: Diagnosis not present

## 2020-01-19 DIAGNOSIS — J449 Chronic obstructive pulmonary disease, unspecified: Secondary | ICD-10-CM | POA: Diagnosis not present

## 2020-01-19 DIAGNOSIS — Z9842 Cataract extraction status, left eye: Secondary | ICD-10-CM | POA: Diagnosis not present

## 2020-01-19 DIAGNOSIS — Z8616 Personal history of COVID-19: Secondary | ICD-10-CM | POA: Diagnosis not present

## 2020-01-19 DIAGNOSIS — E1151 Type 2 diabetes mellitus with diabetic peripheral angiopathy without gangrene: Secondary | ICD-10-CM | POA: Insufficient documentation

## 2020-01-19 DIAGNOSIS — Z9841 Cataract extraction status, right eye: Secondary | ICD-10-CM | POA: Insufficient documentation

## 2020-01-19 DIAGNOSIS — T25311A Burn of third degree of right ankle, initial encounter: Secondary | ICD-10-CM | POA: Diagnosis not present

## 2020-01-19 DIAGNOSIS — M1990 Unspecified osteoarthritis, unspecified site: Secondary | ICD-10-CM | POA: Insufficient documentation

## 2020-01-19 DIAGNOSIS — X010XXA Exposure to flames in uncontrolled fire, not in building or structure, initial encounter: Secondary | ICD-10-CM | POA: Insufficient documentation

## 2020-01-19 DIAGNOSIS — E785 Hyperlipidemia, unspecified: Secondary | ICD-10-CM | POA: Insufficient documentation

## 2020-01-19 DIAGNOSIS — Z9981 Dependence on supplemental oxygen: Secondary | ICD-10-CM | POA: Diagnosis not present

## 2020-01-19 DIAGNOSIS — Z89422 Acquired absence of other left toe(s): Secondary | ICD-10-CM | POA: Diagnosis not present

## 2020-01-19 DIAGNOSIS — I2510 Atherosclerotic heart disease of native coronary artery without angina pectoris: Secondary | ICD-10-CM | POA: Insufficient documentation

## 2020-01-19 DIAGNOSIS — Z7982 Long term (current) use of aspirin: Secondary | ICD-10-CM | POA: Insufficient documentation

## 2020-01-19 DIAGNOSIS — T8189XA Other complications of procedures, not elsewhere classified, initial encounter: Secondary | ICD-10-CM | POA: Diagnosis not present

## 2020-01-19 DIAGNOSIS — Z7902 Long term (current) use of antithrombotics/antiplatelets: Secondary | ICD-10-CM | POA: Insufficient documentation

## 2020-01-19 DIAGNOSIS — I10 Essential (primary) hypertension: Secondary | ICD-10-CM | POA: Insufficient documentation

## 2020-01-19 DIAGNOSIS — Z7984 Long term (current) use of oral hypoglycemic drugs: Secondary | ICD-10-CM | POA: Diagnosis not present

## 2020-01-19 DIAGNOSIS — F329 Major depressive disorder, single episode, unspecified: Secondary | ICD-10-CM | POA: Insufficient documentation

## 2020-01-19 HISTORY — PX: APPLICATION OF WOUND VAC: SHX5189

## 2020-01-19 HISTORY — DX: Personal history of urinary calculi: Z87.442

## 2020-01-19 HISTORY — PX: SKIN SPLIT GRAFT: SHX444

## 2020-01-19 HISTORY — PX: INCISION AND DRAINAGE OF WOUND: SHX1803

## 2020-01-19 LAB — BASIC METABOLIC PANEL
Anion gap: 11 (ref 5–15)
BUN: 17 mg/dL (ref 8–23)
CO2: 29 mmol/L (ref 22–32)
Calcium: 9.1 mg/dL (ref 8.9–10.3)
Chloride: 100 mmol/L (ref 98–111)
Creatinine, Ser: 0.76 mg/dL (ref 0.61–1.24)
GFR calc Af Amer: >60 mL/min (ref >60)
GFR calc non Af Amer: >60 mL/min (ref >60)
Glucose, Bld: 141 mg/dL — ABNORMAL HIGH (ref 70–99)
Potassium: 4.5 mmol/L (ref 3.5–5.1)
Sodium: 140 mmol/L (ref 135–145)

## 2020-01-19 LAB — CBC
HCT: 39.2 % (ref 39.0–52.0)
Hemoglobin: 12.4 g/dL — ABNORMAL LOW (ref 13.0–17.0)
MCH: 30.8 pg (ref 26.0–34.0)
MCHC: 31.6 g/dL (ref 30.0–36.0)
MCV: 97.5 fL (ref 80.0–100.0)
Platelets: 188 10*3/uL (ref 150–400)
RBC: 4.02 MIL/uL — ABNORMAL LOW (ref 4.22–5.81)
RDW: 14.7 % (ref 11.5–15.5)
WBC: 9 10*3/uL (ref 4.0–10.5)
nRBC: 0 % (ref 0.0–0.2)

## 2020-01-19 LAB — GLUCOSE, CAPILLARY
Glucose-Capillary: 144 mg/dL — ABNORMAL HIGH (ref 70–99)
Glucose-Capillary: 138 mg/dL — ABNORMAL HIGH (ref 70–99)

## 2020-01-19 SURGERY — IRRIGATION AND DEBRIDEMENT WOUND
Anesthesia: General | Site: Ankle | Laterality: Right

## 2020-01-19 MED ORDER — ASPIRIN EC 81 MG PO TBEC
81.0000 mg | DELAYED_RELEASE_TABLET | Freq: Every day | ORAL | Status: DC
  Administered 2020-01-19: 81 mg via ORAL
  Filled 2020-01-19: qty 1

## 2020-01-19 MED ORDER — PREGABALIN 75 MG PO CAPS
150.0000 mg | ORAL_CAPSULE | Freq: Two times a day (BID) | ORAL | Status: DC
  Administered 2020-01-19 – 2020-01-20 (×2): 150 mg via ORAL
  Filled 2020-01-19 (×2): qty 2

## 2020-01-19 MED ORDER — BUPROPION HCL ER (SR) 100 MG PO TB12
200.0000 mg | ORAL_TABLET | Freq: Two times a day (BID) | ORAL | Status: DC
  Administered 2020-01-19 – 2020-01-20 (×2): 200 mg via ORAL
  Filled 2020-01-19 (×2): qty 2

## 2020-01-19 MED ORDER — AMLODIPINE BESYLATE 5 MG PO TABS
5.0000 mg | ORAL_TABLET | Freq: Every day | ORAL | Status: DC
  Administered 2020-01-20: 5 mg via ORAL
  Filled 2020-01-19: qty 1

## 2020-01-19 MED ORDER — GLIMEPIRIDE 2 MG PO TABS
2.0000 mg | ORAL_TABLET | Freq: Three times a day (TID) | ORAL | Status: DC
  Administered 2020-01-19 – 2020-01-20 (×2): 2 mg via ORAL
  Filled 2020-01-19 (×3): qty 1

## 2020-01-19 MED ORDER — LOSARTAN POTASSIUM 50 MG PO TABS
50.0000 mg | ORAL_TABLET | Freq: Every day | ORAL | Status: DC
  Administered 2020-01-19 – 2020-01-20 (×2): 50 mg via ORAL
  Filled 2020-01-19 (×2): qty 1

## 2020-01-19 MED ORDER — CARVEDILOL 12.5 MG PO TABS
12.5000 mg | ORAL_TABLET | Freq: Two times a day (BID) | ORAL | Status: DC
  Administered 2020-01-19 – 2020-01-20 (×2): 12.5 mg via ORAL
  Filled 2020-01-19 (×2): qty 1

## 2020-01-19 MED ORDER — HYDROCODONE-ACETAMINOPHEN 5-325 MG PO TABS: 1.0000 | ORAL_TABLET | ORAL | 0 refills | Status: DC | PRN

## 2020-01-19 MED ORDER — LIDOCAINE 2% (20 MG/ML) 5 ML SYRINGE
INTRAMUSCULAR | Status: AC
  Filled 2020-01-19: qty 5

## 2020-01-19 MED ORDER — MINERAL OIL LIGHT EX OIL
TOPICAL_OIL | CUTANEOUS | Status: DC | PRN
  Administered 2020-01-19: 20 mL via TOPICAL

## 2020-01-19 MED ORDER — ATORVASTATIN CALCIUM 80 MG PO TABS
80.0000 mg | ORAL_TABLET | Freq: Every day | ORAL | Status: DC
  Administered 2020-01-19: 80 mg via ORAL
  Filled 2020-01-19: qty 1

## 2020-01-19 MED ORDER — FENTANYL CITRATE (PF) 100 MCG/2ML IJ SOLN
INTRAMUSCULAR | Status: AC
  Filled 2020-01-19: qty 2

## 2020-01-19 MED ORDER — FENTANYL CITRATE (PF) 250 MCG/5ML IJ SOLN
INTRAMUSCULAR | Status: AC
  Filled 2020-01-19: qty 5

## 2020-01-19 MED ORDER — DEXAMETHASONE SODIUM PHOSPHATE 10 MG/ML IJ SOLN
INTRAMUSCULAR | Status: DC | PRN
  Administered 2020-01-19: 4 mg via INTRAVENOUS

## 2020-01-19 MED ORDER — METFORMIN HCL 500 MG PO TABS
1000.0000 mg | ORAL_TABLET | Freq: Every day | ORAL | Status: DC
  Administered 2020-01-19: 1000 mg via ORAL
  Filled 2020-01-19 (×2): qty 2

## 2020-01-19 MED ORDER — MIDAZOLAM HCL 2 MG/2ML IJ SOLN
INTRAMUSCULAR | Status: DC | PRN
  Administered 2020-01-19: 1 mg via INTRAVENOUS

## 2020-01-19 MED ORDER — PHENYLEPHRINE HCL-NACL 10-0.9 MG/250ML-% IV SOLN
INTRAVENOUS | Status: DC | PRN
  Administered 2020-01-19: 25 ug/min via INTRAVENOUS

## 2020-01-19 MED ORDER — ACETAMINOPHEN 500 MG PO TABS
1000.0000 mg | ORAL_TABLET | Freq: Once | ORAL | Status: AC
  Administered 2020-01-19: 1000 mg via ORAL
  Filled 2020-01-19: qty 2

## 2020-01-19 MED ORDER — FENTANYL CITRATE (PF) 100 MCG/2ML IJ SOLN
25.0000 ug | INTRAMUSCULAR | Status: DC | PRN
  Administered 2020-01-19: 50 ug via INTRAVENOUS

## 2020-01-19 MED ORDER — LIDOCAINE 2% (20 MG/ML) 5 ML SYRINGE
INTRAMUSCULAR | Status: DC | PRN
  Administered 2020-01-19: 60 mg via INTRAVENOUS

## 2020-01-19 MED ORDER — BUPIVACAINE HCL (PF) 0.5 % IJ SOLN
INTRAMUSCULAR | Status: AC
  Filled 2020-01-19: qty 30

## 2020-01-19 MED ORDER — FENTANYL CITRATE (PF) 250 MCG/5ML IJ SOLN
INTRAMUSCULAR | Status: DC | PRN
  Administered 2020-01-19 (×3): 50 ug via INTRAVENOUS

## 2020-01-19 MED ORDER — EPINEPHRINE PF 1 MG/ML IJ SOLN
INTRAMUSCULAR | Status: AC
  Filled 2020-01-19: qty 1

## 2020-01-19 MED ORDER — HYDROCODONE-ACETAMINOPHEN 5-325 MG PO TABS
ORAL_TABLET | ORAL | Status: AC
  Filled 2020-01-19: qty 2

## 2020-01-19 MED ORDER — CEFAZOLIN SODIUM-DEXTROSE 2-4 GM/100ML-% IV SOLN
2.0000 g | INTRAVENOUS | Status: AC
  Administered 2020-01-19: 2 g via INTRAVENOUS
  Filled 2020-01-19: qty 100

## 2020-01-19 MED ORDER — DEXAMETHASONE SODIUM PHOSPHATE 10 MG/ML IJ SOLN
INTRAMUSCULAR | Status: AC
  Filled 2020-01-19: qty 1

## 2020-01-19 MED ORDER — APREMILAST 30 MG PO TABS: 30.0000 mg | ORAL_TABLET | Freq: Two times a day (BID) | ORAL | Status: DC

## 2020-01-19 MED ORDER — EPINEPHRINE PF 1 MG/ML IJ SOLN
INTRAMUSCULAR | Status: DC | PRN
  Administered 2020-01-19: 1 mg via SUBCUTANEOUS

## 2020-01-19 MED ORDER — MIDAZOLAM HCL 2 MG/2ML IJ SOLN
INTRAMUSCULAR | Status: AC
  Filled 2020-01-19: qty 2

## 2020-01-19 MED ORDER — HYDROCODONE-ACETAMINOPHEN 5-325 MG PO TABS
1.0000 | ORAL_TABLET | ORAL | Status: DC | PRN
  Administered 2020-01-19 – 2020-01-20 (×4): 2 via ORAL
  Administered 2020-01-20: 1 via ORAL
  Filled 2020-01-19: qty 1
  Filled 2020-01-19 (×4): qty 2

## 2020-01-19 MED ORDER — ONDANSETRON HCL 4 MG/2ML IJ SOLN
INTRAMUSCULAR | Status: AC
  Filled 2020-01-19: qty 2

## 2020-01-19 MED ORDER — ALBUTEROL SULFATE (2.5 MG/3ML) 0.083% IN NEBU: 3.0000 mL | INHALATION_SOLUTION | Freq: Four times a day (QID) | RESPIRATORY_TRACT | Status: DC | PRN

## 2020-01-19 MED ORDER — EPHEDRINE SULFATE-NACL 50-0.9 MG/10ML-% IV SOSY
PREFILLED_SYRINGE | INTRAVENOUS | Status: DC | PRN
  Administered 2020-01-19: 10 mg via INTRAVENOUS
  Administered 2020-01-19: 5 mg via INTRAVENOUS

## 2020-01-19 MED ORDER — ONDANSETRON HCL 4 MG/2ML IJ SOLN: 4.0000 mg | Freq: Once | INTRAMUSCULAR | Status: DC | PRN

## 2020-01-19 MED ORDER — ONDANSETRON HCL 4 MG/2ML IJ SOLN: 4.0000 mg | Freq: Four times a day (QID) | INTRAMUSCULAR | Status: DC | PRN

## 2020-01-19 MED ORDER — PANTOPRAZOLE SODIUM 40 MG PO TBEC
40.0000 mg | DELAYED_RELEASE_TABLET | Freq: Every day | ORAL | Status: DC
  Administered 2020-01-19 – 2020-01-20 (×2): 40 mg via ORAL
  Filled 2020-01-19 (×2): qty 1

## 2020-01-19 MED ORDER — ONDANSETRON 4 MG PO TBDP: 4.0000 mg | ORAL_TABLET | Freq: Four times a day (QID) | ORAL | Status: DC | PRN

## 2020-01-19 MED ORDER — PROPOFOL 10 MG/ML IV BOLUS
INTRAVENOUS | Status: DC | PRN
  Administered 2020-01-19: 200 mg via INTRAVENOUS

## 2020-01-19 MED ORDER — ONDANSETRON HCL 4 MG/2ML IJ SOLN
INTRAMUSCULAR | Status: DC | PRN
  Administered 2020-01-19: 4 mg via INTRAVENOUS

## 2020-01-19 MED ORDER — BUPIVACAINE HCL 0.25 % IJ SOLN
INTRAMUSCULAR | Status: DC | PRN
  Administered 2020-01-19: 30 mL

## 2020-01-19 MED ORDER — DORZOLAMIDE HCL-TIMOLOL MAL 2-0.5 % OP SOLN
1.0000 [drp] | Freq: Two times a day (BID) | OPHTHALMIC | Status: DC
  Administered 2020-01-19: 1 [drp] via OPHTHALMIC
  Filled 2020-01-19: qty 10

## 2020-01-19 MED ORDER — PROPOFOL 10 MG/ML IV BOLUS
INTRAVENOUS | Status: AC
  Filled 2020-01-19: qty 20

## 2020-01-19 MED ORDER — LACTATED RINGERS IV SOLN: INTRAVENOUS | Status: DC

## 2020-01-19 MED FILL — Mineral Oil Light (Bulk): Qty: 20 | Status: AC

## 2020-01-19 SURGICAL SUPPLY — 49 items
BLADE CLIPPER SURG (BLADE) ×1 IMPLANT
BLADE DERMATOME SS (BLADE) ×2 IMPLANT
BLADE SURG 15 STRL LF DISP TIS (BLADE) ×1 IMPLANT
BLADE SURG 15 STRL SS (BLADE) ×2
BNDG GAUZE ELAST 4 BULKY (GAUZE/BANDAGES/DRESSINGS) IMPLANT
CANISTER SUCT 1200ML W/VALVE (MISCELLANEOUS) ×2 IMPLANT
CANISTER SUCT 3000ML PPV (MISCELLANEOUS) IMPLANT
CANISTER WOUND CARE 500ML ATS (WOUND CARE) IMPLANT
COVER SURGICAL LIGHT HANDLE (MISCELLANEOUS) ×2 IMPLANT
DERMACARRIERS GRAFT 1 TO 1.5 (DISPOSABLE) ×2
DRAPE HALF SHEET 40X57 (DRAPES) ×2 IMPLANT
DRAPE INCISE IOBAN 66X45 STRL (DRAPES) IMPLANT
DRAPE LAPAROTOMY T 98X78 PEDS (DRAPES) IMPLANT
DRSG MEPITEL 4X7.2 (GAUZE/BANDAGES/DRESSINGS) ×1 IMPLANT
DRSG OPSITE 6X11 MED (GAUZE/BANDAGES/DRESSINGS) IMPLANT
DRSG OPSITE POSTOP 4X8 (GAUZE/BANDAGES/DRESSINGS) ×1 IMPLANT
DRSG PAD ABDOMINAL 8X10 ST (GAUZE/BANDAGES/DRESSINGS) IMPLANT
DRSG TELFA 3X8 NADH (GAUZE/BANDAGES/DRESSINGS) ×4 IMPLANT
DRSG VAC ATS LRG SENSATRAC (GAUZE/BANDAGES/DRESSINGS) IMPLANT
DRSG VAC ATS MED SENSATRAC (GAUZE/BANDAGES/DRESSINGS) ×1 IMPLANT
DRSG VAC ATS SM SENSATRAC (GAUZE/BANDAGES/DRESSINGS) IMPLANT
ELECT CAUTERY BLADE 6.4 (BLADE) ×2 IMPLANT
ELECT REM PT RETURN 9FT ADLT (ELECTROSURGICAL) ×2
ELECTRODE REM PT RTRN 9FT ADLT (ELECTROSURGICAL) ×1 IMPLANT
FILTER STRAW FLUID ASPIR (MISCELLANEOUS) ×2 IMPLANT
GAUZE SPONGE 4X4 12PLY STRL (GAUZE/BANDAGES/DRESSINGS) ×2 IMPLANT
GLOVE BIOGEL PI IND STRL 7.5 (GLOVE) IMPLANT
GLOVE BIOGEL PI IND STRL 8 (GLOVE) ×1 IMPLANT
GLOVE BIOGEL PI INDICATOR 7.5 (GLOVE) ×3
GLOVE BIOGEL PI INDICATOR 8 (GLOVE) ×1
GLOVE INDICATOR 8.0 STRL GRN (GLOVE) ×4 IMPLANT
GOWN STRL REUS W/ TWL LRG LVL3 (GOWN DISPOSABLE) ×2 IMPLANT
GOWN STRL REUS W/TWL LRG LVL3 (GOWN DISPOSABLE) ×4
GRAFT DERMACARRIERS 1 TO 1.5 (DISPOSABLE) IMPLANT
IV NS 1000ML (IV SOLUTION) ×2
IV NS 1000ML BAXH (IV SOLUTION) ×1 IMPLANT
KIT BASIN OR (CUSTOM PROCEDURE TRAY) ×2 IMPLANT
KIT DRSG PREVENA PLUS 7DAY 125 (MISCELLANEOUS) ×1 IMPLANT
KIT TURNOVER KIT A (KITS) ×2 IMPLANT
KIT TURNOVER KIT B (KITS) ×2 IMPLANT
NS IRRIG 1000ML POUR BTL (IV SOLUTION) ×2 IMPLANT
PACK GENERAL/GYN (CUSTOM PROCEDURE TRAY) ×2 IMPLANT
PAD ARMBOARD 7.5X6 YLW CONV (MISCELLANEOUS) ×4 IMPLANT
PAD DRESSING TELFA 3X8 NADH (GAUZE/BANDAGES/DRESSINGS) IMPLANT
SOLUTION BETADINE 4OZ (MISCELLANEOUS) ×2 IMPLANT
SUT VIC AB 4-0 PS2 18 (SUTURE) ×1 IMPLANT
SYR 50ML LL SCALE MARK (SYRINGE) ×2 IMPLANT
TOWEL GREEN STERILE (TOWEL DISPOSABLE) ×2 IMPLANT
TOWEL GREEN STERILE FF (TOWEL DISPOSABLE) ×2 IMPLANT

## 2020-01-19 NOTE — Plan of Care (Signed)

## 2020-01-19 NOTE — Anesthesia Procedure Notes (Signed)
Procedure Name: LMA Insertion Date/Time: 01/19/2020 8:09 AM Performed by: Bryson Corona, CRNA Pre-anesthesia Checklist: Patient identified, Emergency Drugs available, Suction available and Patient being monitored Patient Re-evaluated:Patient Re-evaluated prior to induction Oxygen Delivery Method: Circle System Utilized Preoxygenation: Pre-oxygenation with 100% oxygen Induction Type: IV induction LMA: LMA with gastric port inserted LMA Size: 4.0 Number of attempts: 1 Placement Confirmation: positive ETCO2 Tube secured with: Tape Dental Injury: Teeth and Oropharynx as per pre-operative assessment

## 2020-01-19 NOTE — Progress Notes (Signed)
Paged MD for diet order. Awaiting return call.
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
## 2020-01-19 NOTE — Telephone Encounter (Signed)
I completed this

## 2020-01-19 NOTE — Care Management CC44 (Signed)
Condition Code 44 Documentation Completed  Patient Details  Name: [name] MRN: [id number] Date of Birth: 05/12/1946   Condition Code 44 given:  Yes Patient signature on Condition Code 44 notice:  Yes Documentation of 2 MD's agreement:  Yes Code 44 added to claim:  Yes    Marilu Favre, RN 01/19/2020, 4:30 PM

## 2020-01-19 NOTE — Discharge Instructions (Signed)
Activity As tolerated: NO showers until after 1 week follow up. No showers while wound vac is on. NO driving No heavy activities Avoid excessive movement of R ankle to avoid shearing skin graft. Keep boot in place for 1 week. Ensure adequate cushioning of foot/leg within boot to prevent abrasions to leg/foot.  RESTART your normal dosing schedule of PLAVIX ON Thursday 3/18.   Diet: Regular  Wound Care: Keep dressing clean & dry. Keep dressing in place over right lower leg and upper leg. Do not change dressings.  Special Instructions: Call Doctor if any unusual problems occur such as pain, excessive Bleeding, unrelieved Nausea/vomiting, Fever &/or chills  Follow-up appointment: Scheduled for next week. We will remove wound vac at that time.

## 2020-01-19 NOTE — Anesthesia Postprocedure Evaluation (Signed)
Anesthesia Post Note  Patient: [name]  Procedure(s) Performed: Debridement right ankle bone (Right Ankle) SKIN GRAFT SPLIT THICKNESS (Right Ankle) APPLICATION OF WOUND VAC (Right Ankle)     Patient location during evaluation: PACU Anesthesia Type: General Level of consciousness: awake and alert Pain management: pain level controlled Vital Signs Assessment: post-procedure vital signs reviewed and stable Respiratory status: spontaneous breathing, nonlabored ventilation, respiratory function stable and patient connected to nasal cannula oxygen Cardiovascular status: blood pressure returned to baseline and stable Postop Assessment: no apparent nausea or vomiting Anesthetic complications: no    Last Vitals:  Vitals:   01/19/20 1751 01/19/20 2047  BP: (!) 146/84 121/63  Pulse: 72 83  Resp: 18 18  Temp: 36.6 C 36.9 C  SpO2: 97% 96%    Last Pain:  Vitals:   01/19/20 2047  TempSrc: Oral  PainSc:                  [name]

## 2020-01-19 NOTE — Transfer of Care (Signed)
Immediate Anesthesia Transfer of Care Note  Patient: [name]  Procedure(s) Performed: Debridement right ankle bone (Right Ankle) SKIN GRAFT SPLIT THICKNESS (Right Ankle) APPLICATION OF WOUND VAC (Right Ankle)  Patient Location: PACU  Anesthesia Type:General  Level of Consciousness: awake, alert  and oriented  Airway & Oxygen Therapy: Patient Spontanous Breathing and Patient connected to face mask oxygen  Post-op Assessment: Report given to RN and Post -op Vital signs reviewed and stable  Post vital signs: Reviewed and stable  Last Vitals:  Vitals Value Taken Time  BP 136/75   Temp    Pulse 74   Resp 16   SpO2 98%     Last Pain:  Vitals:   01/19/20 0639  PainSc: 6       Patients Stated Pain Goal: 2 ([id number])  Complications: No apparent anesthesia complications
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
## 2020-01-19 NOTE — Telephone Encounter (Signed)
Mendel Ryder, nurse at [facility], calling to get diet orders for patient. She can be reached at [phone] to advise.

## 2020-01-20 ENCOUNTER — Other Ambulatory Visit: Payer: Self-pay | Admitting: Surgical

## 2020-01-20 DIAGNOSIS — I2510 Atherosclerotic heart disease of native coronary artery without angina pectoris: Secondary | ICD-10-CM | POA: Diagnosis not present

## 2020-01-20 DIAGNOSIS — Z7902 Long term (current) use of antithrombotics/antiplatelets: Secondary | ICD-10-CM | POA: Diagnosis not present

## 2020-01-20 DIAGNOSIS — E1151 Type 2 diabetes mellitus with diabetic peripheral angiopathy without gangrene: Secondary | ICD-10-CM | POA: Diagnosis not present

## 2020-01-20 DIAGNOSIS — Z9841 Cataract extraction status, right eye: Secondary | ICD-10-CM | POA: Diagnosis not present

## 2020-01-20 DIAGNOSIS — Z9842 Cataract extraction status, left eye: Secondary | ICD-10-CM | POA: Diagnosis not present

## 2020-01-20 DIAGNOSIS — J449 Chronic obstructive pulmonary disease, unspecified: Secondary | ICD-10-CM | POA: Diagnosis not present

## 2020-01-20 DIAGNOSIS — Z8616 Personal history of COVID-19: Secondary | ICD-10-CM | POA: Diagnosis not present

## 2020-01-20 DIAGNOSIS — Z7984 Long term (current) use of oral hypoglycemic drugs: Secondary | ICD-10-CM | POA: Diagnosis not present

## 2020-01-20 DIAGNOSIS — M1990 Unspecified osteoarthritis, unspecified site: Secondary | ICD-10-CM | POA: Diagnosis not present

## 2020-01-20 DIAGNOSIS — Z8673 Personal history of transient ischemic attack (TIA), and cerebral infarction without residual deficits: Secondary | ICD-10-CM | POA: Diagnosis not present

## 2020-01-20 DIAGNOSIS — Z87891 Personal history of nicotine dependence: Secondary | ICD-10-CM | POA: Diagnosis not present

## 2020-01-20 DIAGNOSIS — E785 Hyperlipidemia, unspecified: Secondary | ICD-10-CM | POA: Diagnosis not present

## 2020-01-20 DIAGNOSIS — Z89422 Acquired absence of other left toe(s): Secondary | ICD-10-CM | POA: Diagnosis not present

## 2020-01-20 DIAGNOSIS — Z961 Presence of intraocular lens: Secondary | ICD-10-CM | POA: Diagnosis not present

## 2020-01-20 DIAGNOSIS — Z79899 Other long term (current) drug therapy: Secondary | ICD-10-CM | POA: Diagnosis not present

## 2020-01-20 DIAGNOSIS — Z9981 Dependence on supplemental oxygen: Secondary | ICD-10-CM | POA: Diagnosis not present

## 2020-01-20 DIAGNOSIS — K219 Gastro-esophageal reflux disease without esophagitis: Secondary | ICD-10-CM | POA: Diagnosis not present

## 2020-01-20 DIAGNOSIS — Z7982 Long term (current) use of aspirin: Secondary | ICD-10-CM | POA: Diagnosis not present

## 2020-01-20 DIAGNOSIS — I10 Essential (primary) hypertension: Secondary | ICD-10-CM | POA: Diagnosis not present

## 2020-01-20 DIAGNOSIS — T25311A Burn of third degree of right ankle, initial encounter: Secondary | ICD-10-CM | POA: Diagnosis not present

## 2020-01-20 MED ORDER — HYDROCODONE-ACETAMINOPHEN 5-325 MG PO TABS: 1.0000 | ORAL_TABLET | Freq: Four times a day (QID) | ORAL | 0 refills | Status: AC | PRN

## 2020-01-20 NOTE — TOC Transition Note (Addendum)
Transition of Care [facility]) - CM/SW Discharge Note   Patient Details  Name: [name] MRN: [id number] Date of Birth: July 07, 1946  Transition of Care [facility]) CM/SW Contact:  Maryclare Labrador, RN Phone Number: 01/20/2020, 8:09 AM   Clinical Narrative:   Pt is deemed stable to discharge home today.  Bedside nurse confirm to confirm if pt will go home with [facility] or [facility] - both are at bedside.  Bedside nurse to follow up with CM   Update:  Per surgery PA - pt will discharge home on [facility] vac .  No other CM needs - CM signing off   Final next level of care: Home/Self Care Barriers to Discharge: Barriers Resolved   Patient Goals and CMS Choice Patient states their goals for this hospitalization and ongoing recovery are:: to return home CMS Medicare.gov Compare Post Acute Care list provided to:: Patient Choice offered to / list presented to : NA  Discharge Placement                       Discharge Plan and Services   Discharge Planning Services: CM Consult Post Acute Care Choice: NA          DME Arranged: Vac DME Agency: [facility] Date DME Agency Contacted: 01/19/20 Time DME Agency Contacted: [phone] Representative spoke with at DME Agency: [facility] Arranged: NA          Social Determinants of Health ([place]) Interventions     Readmission Risk Interventions No flowsheet data found.
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
## 2020-01-20 NOTE — Progress Notes (Signed)
Postop pain medications

## 2020-01-21 ENCOUNTER — Encounter (INDEPENDENT_AMBULATORY_CARE_PROVIDER_SITE_OTHER): Payer: Self-pay

## 2020-01-21 ENCOUNTER — Ambulatory Visit: Payer: Medicare Other | Admitting: Family Medicine

## 2020-01-22 ENCOUNTER — Telehealth: Payer: Self-pay | Admitting: Plastic Surgery

## 2020-01-22 ENCOUNTER — Encounter (INDEPENDENT_AMBULATORY_CARE_PROVIDER_SITE_OTHER): Payer: Self-pay

## 2020-01-22 MED ORDER — LIDOCAINE-PRILOCAINE 2.5-2.5 % EX CREA: 1.0000 "application " | TOPICAL_CREAM | CUTANEOUS | 0 refills | Status: DC | PRN

## 2020-01-22 NOTE — Telephone Encounter (Signed)
Dr. Marla Roe called and spoke with the patient regarding the message below.  She informed him to skip his Plavix dose on tomorrow morning, and she will be sending in EMLA cream for him to use on the area.  She also asked him to apply ice to the area every 5 mins on and off.  Patient verbalized understanding and agreed.//AB/CMA

## 2020-01-22 NOTE — Telephone Encounter (Signed)
Patient's wife called to say that every time her husband gets up, the place where he has his graft bleeds through the bandage. Please call to advise whether they should come in or what to do.

## 2020-01-23 ENCOUNTER — Encounter (INDEPENDENT_AMBULATORY_CARE_PROVIDER_SITE_OTHER): Payer: Self-pay

## 2020-01-23 ENCOUNTER — Other Ambulatory Visit: Payer: Self-pay | Admitting: Family Medicine

## 2020-01-24 ENCOUNTER — Encounter (INDEPENDENT_AMBULATORY_CARE_PROVIDER_SITE_OTHER): Payer: Self-pay

## 2020-01-24 MED ORDER — CLOPIDOGREL BISULFATE 75 MG PO TABS: 75.0000 mg | ORAL_TABLET | Freq: Every day | ORAL | 1 refills | Status: DC

## 2020-01-25 ENCOUNTER — Encounter (INDEPENDENT_AMBULATORY_CARE_PROVIDER_SITE_OTHER): Payer: Self-pay

## 2020-01-26 ENCOUNTER — Encounter (INDEPENDENT_AMBULATORY_CARE_PROVIDER_SITE_OTHER): Payer: Self-pay | Admitting: Family Medicine

## 2020-01-26 ENCOUNTER — Other Ambulatory Visit: Payer: Self-pay

## 2020-01-26 ENCOUNTER — Encounter (INDEPENDENT_AMBULATORY_CARE_PROVIDER_SITE_OTHER): Payer: Self-pay

## 2020-01-26 ENCOUNTER — Telehealth (INDEPENDENT_AMBULATORY_CARE_PROVIDER_SITE_OTHER): Payer: Medicare Other | Admitting: Family Medicine

## 2020-01-26 DIAGNOSIS — Z6831 Body mass index (BMI) 31.0-31.9, adult: Secondary | ICD-10-CM

## 2020-01-26 DIAGNOSIS — I10 Essential (primary) hypertension: Secondary | ICD-10-CM

## 2020-01-26 DIAGNOSIS — E6609 Other obesity due to excess calories: Secondary | ICD-10-CM | POA: Diagnosis not present

## 2020-01-26 DIAGNOSIS — E119 Type 2 diabetes mellitus without complications: Secondary | ICD-10-CM | POA: Diagnosis not present

## 2020-01-26 DIAGNOSIS — E1149 Type 2 diabetes mellitus with other diabetic neurological complication: Secondary | ICD-10-CM

## 2020-01-26 DIAGNOSIS — H524 Presbyopia: Secondary | ICD-10-CM | POA: Diagnosis not present

## 2020-01-26 DIAGNOSIS — H401131 Primary open-angle glaucoma, bilateral, mild stage: Secondary | ICD-10-CM | POA: Diagnosis not present

## 2020-01-26 LAB — HM DIABETES EYE EXAM
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
## 2020-01-27 ENCOUNTER — Other Ambulatory Visit: Payer: Self-pay

## 2020-01-27 ENCOUNTER — Encounter: Payer: Self-pay | Admitting: Plastic Surgery

## 2020-01-27 ENCOUNTER — Ambulatory Visit (INDEPENDENT_AMBULATORY_CARE_PROVIDER_SITE_OTHER): Payer: Medicare Other | Admitting: Plastic Surgery

## 2020-01-27 ENCOUNTER — Other Ambulatory Visit: Payer: Self-pay | Admitting: Plastic Surgery

## 2020-01-27 ENCOUNTER — Encounter (INDEPENDENT_AMBULATORY_CARE_PROVIDER_SITE_OTHER): Payer: Self-pay

## 2020-01-27 VITALS — BP 121/68 | HR 88 | Temp 97.1°F | Ht 69.0 in | Wt 224.0 lb

## 2020-01-27 DIAGNOSIS — T300 Burn of unspecified body region, unspecified degree: Secondary | ICD-10-CM

## 2020-01-27 MED ORDER — HYDROCODONE-ACETAMINOPHEN 5-325 MG PO TABS: 1.0000 | ORAL_TABLET | ORAL | 0 refills | Status: DC | PRN

## 2020-01-27 MED ORDER — HYDROCODONE-ACETAMINOPHEN 5-325 MG PO TABS: 1.0000 | ORAL_TABLET | Freq: Four times a day (QID) | ORAL | 0 refills | Status: DC | PRN
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
## 2020-01-28 ENCOUNTER — Telehealth: Payer: Self-pay | Admitting: Internal Medicine

## 2020-01-28 NOTE — Telephone Encounter (Signed)
Pt said pain meds [facility] [address] but nothing is there. Please call patient to advise.

## 2020-01-28 NOTE — Telephone Encounter (Signed)
Called and spoke with the patient's wife and informed her that the pain med prescription is at the pharmacy and ready to be picked up.  The wife verbalized understanding and agreed.//AB/CMA

## 2020-01-29 ENCOUNTER — Encounter (INDEPENDENT_AMBULATORY_CARE_PROVIDER_SITE_OTHER): Payer: Self-pay

## 2020-01-29 ENCOUNTER — Other Ambulatory Visit: Payer: Self-pay | Admitting: Plastic Surgery

## 2020-01-30 ENCOUNTER — Encounter (INDEPENDENT_AMBULATORY_CARE_PROVIDER_SITE_OTHER): Payer: Self-pay

## 2020-01-31 ENCOUNTER — Encounter (INDEPENDENT_AMBULATORY_CARE_PROVIDER_SITE_OTHER): Payer: Self-pay

## 2020-02-01 ENCOUNTER — Encounter (INDEPENDENT_AMBULATORY_CARE_PROVIDER_SITE_OTHER): Payer: Self-pay

## 2020-02-02 ENCOUNTER — Telehealth: Payer: Self-pay | Admitting: Plastic Surgery

## 2020-02-02 NOTE — Telephone Encounter (Signed)
It looks like this is a patient of Dr. Claudia Desanctis.  I recommend you follow up with him or matt.  Thanks!

## 2020-02-02 NOTE — Telephone Encounter (Signed)
Pt's wife called to advise that they are running low on xeroform dressings  and sterilux gauze. Please call to advise.

## 2020-02-02 NOTE — Telephone Encounter (Signed)
Called and spoke with the patient's wife and she stated that they will be running out of supplies soon.  Asked her how much did they have left and she stated:(#6 left of the Xerorform-4x4 and #3-4 left of the Sterilux gauze).  Asked the wife how often is the dressing been changed and she stated daily.  I informed her that I will give [facility] a call to see if we can get more supplies, but I did inform her that depending on the number of days the order asked for we may have to wait before another order can be sent.  She verbalized understanding and agreed.  Called the wife back and informed her that I spoke with [facility] and the first order was for 30 days, so we will have to wait until (02/14/20) before sending another order so the insurance can pay.  She stated they will run out before then.  Informed her that I will speak with Dr. Claudia Desanctis to see what he would like for them to do.  She verbalized understanding and agreed.  Called back and spoke with the wife and informed her that I spoke with [facility] and she recommended instead of placing (2) Xeroforms on the ankle, place (1) and then cut another one in half, then she will be placing 11/2 of Xeroform on the ankle.  Asked her to stop using the Xeroform on the thigh, because the order for the Xeroform was for the ankle only.  She can use plain vaseline and gauze on the thigh.  Explained to her that the Xeroform has vaseline in it.  Also informed her if she runs out of the Xeroform then she can also use the plain vaseline on the ankle as well.  She stated that she hopes the 11/2 of the Xeroform will cover the ankle.  She verbalized understanding and agreed.//AB/CMA

## 2020-02-03 ENCOUNTER — Encounter: Payer: Self-pay | Admitting: *Deleted

## 2020-02-04 ENCOUNTER — Telehealth: Payer: Self-pay | Admitting: Plastic Surgery

## 2020-02-04 ENCOUNTER — Other Ambulatory Visit (INDEPENDENT_AMBULATORY_CARE_PROVIDER_SITE_OTHER): Payer: Medicare Other | Admitting: Plastic Surgery

## 2020-02-04 DIAGNOSIS — T300 Burn of unspecified body region, unspecified degree: Secondary | ICD-10-CM

## 2020-02-04 MED ORDER — HYDROCODONE-ACETAMINOPHEN 5-325 MG PO TABS: 1.0000 | ORAL_TABLET | Freq: Three times a day (TID) | ORAL | 0 refills | Status: AC | PRN

## 2020-02-04 NOTE — Telephone Encounter (Signed)
Pt called to request a refill on his hydrocodone. Please call patient to advise.

## 2020-02-04 NOTE — Progress Notes (Signed)
Sent 20 tablets of Norco to the pharmacy and informed patient that this was the last pain Rx we would write so he needs to save them for severe pain.

## 2020-02-05 DIAGNOSIS — J9601 Acute respiratory failure with hypoxia: Secondary | ICD-10-CM | POA: Diagnosis not present

## 2020-02-05 DIAGNOSIS — J449 Chronic obstructive pulmonary disease, unspecified: Secondary | ICD-10-CM | POA: Diagnosis not present

## 2020-02-08 ENCOUNTER — Telehealth: Payer: Self-pay | Admitting: Plastic Surgery

## 2020-02-08 NOTE — Telephone Encounter (Signed)
Returned patients call. He picked up the Norco on 02/05/20-per Beverly Gust

## 2020-02-08 NOTE — Telephone Encounter (Signed)
Patient lvm because he missed our call on Thursday. He was following up on the prescription refill request.

## 2020-02-08 NOTE — Telephone Encounter (Signed)
Returned patients call. He picked up the Norco on 02/05/20

## 2020-02-09 ENCOUNTER — Other Ambulatory Visit (INDEPENDENT_AMBULATORY_CARE_PROVIDER_SITE_OTHER): Payer: Self-pay | Admitting: Family Medicine

## 2020-02-09 DIAGNOSIS — F418 Other specified anxiety disorders: Secondary | ICD-10-CM

## 2020-02-10 ENCOUNTER — Encounter (INDEPENDENT_AMBULATORY_CARE_PROVIDER_SITE_OTHER): Payer: Self-pay | Admitting: Family Medicine

## 2020-02-11 ENCOUNTER — Ambulatory Visit (INDEPENDENT_AMBULATORY_CARE_PROVIDER_SITE_OTHER): Payer: Medicare Other | Admitting: Plastic Surgery

## 2020-02-11 ENCOUNTER — Other Ambulatory Visit: Payer: Self-pay

## 2020-02-11 ENCOUNTER — Encounter: Payer: Self-pay | Admitting: Plastic Surgery

## 2020-02-11 VITALS — BP 148/87 | HR 82 | Temp 97.9°F | Ht 69.0 in | Wt 217.0 lb

## 2020-02-11 DIAGNOSIS — T300 Burn of unspecified body region, unspecified degree: Secondary | ICD-10-CM
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
## 2020-02-12 ENCOUNTER — Telehealth: Payer: Self-pay

## 2020-02-12 NOTE — Telephone Encounter (Signed)
Orders faxed to [facility] for wound/dressing supplies- per Dr. Claudia Desanctis

## 2020-02-15 DIAGNOSIS — L97529 Non-pressure chronic ulcer of other part of left foot with unspecified severity: Secondary | ICD-10-CM | POA: Diagnosis not present

## 2020-02-16 ENCOUNTER — Ambulatory Visit: Payer: Medicare Other | Admitting: Podiatry

## 2020-02-16 ENCOUNTER — Encounter: Payer: Self-pay | Admitting: Podiatry

## 2020-02-16 ENCOUNTER — Other Ambulatory Visit: Payer: Self-pay

## 2020-02-16 VITALS — Temp 96.9°F

## 2020-02-16 DIAGNOSIS — L84 Corns and callosities: Secondary | ICD-10-CM | POA: Diagnosis not present

## 2020-02-16 DIAGNOSIS — M2042 Other hammer toe(s) (acquired), left foot: Secondary | ICD-10-CM
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
## 2020-02-18 ENCOUNTER — Ambulatory Visit (INDEPENDENT_AMBULATORY_CARE_PROVIDER_SITE_OTHER): Payer: Medicare Other | Admitting: Family Medicine

## 2020-02-23 ENCOUNTER — Other Ambulatory Visit: Payer: Self-pay

## 2020-02-23 ENCOUNTER — Telehealth: Payer: Self-pay | Admitting: Family Medicine

## 2020-02-23 ENCOUNTER — Ambulatory Visit (INDEPENDENT_AMBULATORY_CARE_PROVIDER_SITE_OTHER): Payer: Medicare Other | Admitting: Family Medicine

## 2020-02-23 VITALS — BP 120/60 | HR 75 | Temp 98.7°F | Resp 12 | Ht 69.0 in | Wt 226.4 lb

## 2020-02-23 DIAGNOSIS — E785 Hyperlipidemia, unspecified: Secondary | ICD-10-CM | POA: Diagnosis not present

## 2020-02-23 DIAGNOSIS — I10 Essential (primary) hypertension: Secondary | ICD-10-CM | POA: Diagnosis not present

## 2020-02-23 DIAGNOSIS — E1149 Type 2 diabetes mellitus with other diabetic neurological complication: Secondary | ICD-10-CM | POA: Diagnosis not present

## 2020-02-23 DIAGNOSIS — N289 Disorder of kidney and ureter, unspecified: Secondary | ICD-10-CM | POA: Diagnosis not present

## 2020-02-23 DIAGNOSIS — T300 Burn of unspecified body region, unspecified degree: Secondary | ICD-10-CM | POA: Diagnosis not present

## 2020-02-23 NOTE — Patient Instructions (Signed)
COVID-19 COVID-19 is a respiratory infection that is caused by a virus called severe acute respiratory syndrome coronavirus 2 (SARS-CoV-2). The disease is also known as coronavirus disease or novel coronavirus. In some people, the virus may not cause any symptoms. In others, it may cause a serious infection. The infection can get worse quickly and can lead to complications, such as:  Pneumonia, or infection of the lungs.  Acute respiratory distress syndrome or ARDS. This is a condition in which fluid build-up in the lungs prevents the lungs from filling with air and passing oxygen into the blood.  Acute respiratory failure. This is a condition in which there is not enough oxygen passing from the lungs to the body or when carbon dioxide is not passing from the lungs out of the body.  Sepsis or septic shock. This is a serious bodily reaction to an infection.  Blood clotting problems.  Secondary infections due to bacteria or fungus.  Organ failure. This is when your body's organs stop working. The virus that causes COVID-19 is contagious. This means that it can spread from person to person through droplets from coughs and sneezes (respiratory secretions). What are the causes? This illness is caused by a virus. You may catch the virus by:  Breathing in droplets from an infected person. Droplets can be spread by a person breathing, speaking, singing, coughing, or sneezing.  Touching something, like a table or a doorknob, that was exposed to the virus (contaminated) and then touching your mouth, nose, or eyes. What increases the risk? Risk for infection You are more likely to be infected with this virus if you:  Are within 6 feet (2 meters) of a person with COVID-19.  Provide care for or live with a person who is infected with COVID-19.  Spend time in crowded indoor spaces or live in shared housing. Risk for serious illness You are more likely to become seriously ill from the virus if you:   Are 50 years of age or older. The higher your age, the more you are at risk for serious illness.  Live in a nursing home or long-term care facility.  Have cancer.  Have a long-term (chronic) disease such as: ? Chronic lung disease, including chronic obstructive pulmonary disease or asthma. ? A long-term disease that lowers your body's ability to fight infection (immunocompromised). ? Heart disease, including heart failure, a condition in which the arteries that lead to the heart become narrow or blocked (coronary artery disease), a disease which makes the heart muscle thick, weak, or stiff (cardiomyopathy). ? Diabetes. ? Chronic kidney disease. ? Sickle cell disease, a condition in which red blood cells have an abnormal "sickle" shape. ? Liver disease.  Are obese. What are the signs or symptoms? Symptoms of this condition can range from mild to severe. Symptoms may appear any time from 2 to 14 days after being exposed to the virus. They include:  A fever or chills.  A cough.  Difficulty breathing.  Headaches, body aches, or muscle aches.  Runny or stuffy (congested) nose.  A sore throat.  New loss of taste or smell. Some people may also have stomach problems, such as nausea, vomiting, or diarrhea. Other people may not have any symptoms of COVID-19. How is this diagnosed? This condition may be diagnosed based on:  Your signs and symptoms, especially if: ? You live in an area with a COVID-19 outbreak. ? You recently traveled to or from an area where the virus is common. ? You   provide care for or live with a person who was diagnosed with COVID-19. ? You were exposed to a person who was diagnosed with COVID-19.  A physical exam.  Lab tests, which may include: ? Taking a sample of fluid from the back of your nose and throat (nasopharyngeal fluid), your nose, or your throat using a swab. ? A sample of mucus from your lungs (sputum). ? Blood tests.  Imaging tests, which  may include, X-rays, CT scan, or ultrasound. How is this treated? At present, there is no medicine to treat COVID-19. Medicines that treat other diseases are being used on a trial basis to see if they are effective against COVID-19. Your health care provider will talk with you about ways to treat your symptoms. For most people, the infection is mild and can be managed at home with rest, fluids, and over-the-counter medicines. Treatment for a serious infection usually takes places in a hospital intensive care unit (ICU). It may include one or more of the following treatments. These treatments are given until your symptoms improve.  Receiving fluids and medicines through an IV.  Supplemental oxygen. Extra oxygen is given through a tube in the nose, a face mask, or a hood.  Positioning you to lie on your stomach (prone position). This makes it easier for oxygen to get into the lungs.  Continuous positive airway pressure (CPAP) or bi-level positive airway pressure (BPAP) machine. This treatment uses mild air pressure to keep the airways open. A tube that is connected to a motor delivers oxygen to the body.  Ventilator. This treatment moves air into and out of the lungs by using a tube that is placed in your windpipe.  Tracheostomy. This is a procedure to create a hole in the neck so that a breathing tube can be inserted.  Extracorporeal membrane oxygenation (ECMO). This procedure gives the lungs a chance to recover by taking over the functions of the heart and lungs. It supplies oxygen to the body and removes carbon dioxide. Follow these instructions at home: Lifestyle  If you are sick, stay home except to get medical care. Your health care provider will tell you how long to stay home. Call your health care provider before you go for medical care.  Rest at home as told by your health care provider.  Do not use any products that contain nicotine or tobacco, such as cigarettes, e-cigarettes, and  chewing tobacco. If you need help quitting, ask your health care provider.  Return to your normal activities as told by your health care provider. Ask your health care provider what activities are safe for you. General instructions  Take over-the-counter and prescription medicines only as told by your health care provider.  Drink enough fluid to keep your urine pale yellow.  Keep all follow-up visits as told by your health care provider. This is important. How is this prevented?  There is no vaccine to help prevent COVID-19 infection. However, there are steps you can take to protect yourself and others from this virus. To protect yourself:   Do not travel to areas where COVID-19 is a risk. The areas where COVID-19 is reported change often. To identify high-risk areas and travel restrictions, check the CDC travel website: wwwnc.cdc.gov/travel/notices  If you live in, or must travel to, an area where COVID-19 is a risk, take precautions to avoid infection. ? Stay away from people who are sick. ? Wash your hands often with soap and water for 20 seconds. If soap and water   are not available, use an alcohol-based hand sanitizer. ? Avoid touching your mouth, face, eyes, or nose. ? Avoid going out in public, follow guidance from your state and local health authorities. ? If you must go out in public, wear a cloth face covering or face mask. Make sure your mask covers your nose and mouth. ? Avoid crowded indoor spaces. Stay at least 6 feet (2 meters) away from others. ? Disinfect objects and surfaces that are frequently touched every day. This may include:  Counters and tables.  Doorknobs and light switches.  Sinks and faucets.  Electronics, such as phones, remote controls, keyboards, computers, and tablets. To protect others: If you have symptoms of COVID-19, take steps to prevent the virus from spreading to others.  If you think you have a COVID-19 infection, contact your health care  provider right away. Tell your health care team that you think you may have a COVID-19 infection.  Stay home. Leave your house only to seek medical care. Do not use public transport.  Do not travel while you are sick.  Wash your hands often with soap and water for 20 seconds. If soap and water are not available, use alcohol-based hand sanitizer.  Stay away from other members of your household. Let healthy household members care for children and pets, if possible. If you have to care for children or pets, wash your hands often and wear a mask. If possible, stay in your own room, separate from others. Use a different bathroom.  Make sure that all people in your household wash their hands well and often.  Cough or sneeze into a tissue or your sleeve or elbow. Do not cough or sneeze into your hand or into the air.  Wear a cloth face covering or face mask. Make sure your mask covers your nose and mouth. Where to find more information  Centers for Disease Control and Prevention: www.cdc.gov/coronavirus/2019-ncov/index.html  World Health Organization: www.who.int/health-topics/coronavirus Contact a health care provider if:  You live in or have traveled to an area where COVID-19 is a risk and you have symptoms of the infection.  You have had contact with someone who has COVID-19 and you have symptoms of the infection. Get help right away if:  You have trouble breathing.  You have pain or pressure in your chest.  You have confusion.  You have bluish lips and fingernails.  You have difficulty waking from sleep.  You have symptoms that get worse. These symptoms may represent a serious problem that is an emergency. Do not wait to see if the symptoms will go away. Get medical help right away. Call your local emergency services (911 in the [place] Do not drive yourself to the hospital. Let the emergency medical personnel know if you think you have COVID-19. Summary  COVID-19 is a  respiratory infection that is caused by a virus. It is also known as coronavirus disease or novel coronavirus. It can cause serious infections, such as pneumonia, acute respiratory distress syndrome, acute respiratory failure, or sepsis.  The virus that causes COVID-19 is contagious. This means that it can spread from person to person through droplets from breathing, speaking, singing, coughing, or sneezing.  You are more likely to develop a serious illness if you are 50 years of age or older, have a weak immune system, live in a nursing home, or have chronic disease.  There is no medicine to treat COVID-19. Your health care provider will talk with you about ways to treat your symptoms.    Take steps to protect yourself and others from infection. Wash your hands often and disinfect objects and surfaces that are frequently touched every day. Stay away from people who are sick and wear a mask if you are sick. This information is not intended to replace advice given to you by your health care provider. Make sure you discuss any questions you have with your health care provider. Document Revised: 08/21/2019 Document Reviewed: 11/27/2018 Elsevier Patient Education  2020 [employer]  
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
## 2020-02-23 NOTE — Assessment & Plan Note (Signed)
Encouraged heart healthy diet, increase exercise, avoid trans fats, consider a krill oil cap daily 

## 2020-02-23 NOTE — Telephone Encounter (Signed)
Patient is requesting virtual appt spouse of Ayal Lagow . They would like back to back appt. Please advise , if possible .

## 2020-02-23 NOTE — Assessment & Plan Note (Signed)
Well controlled, no changes to meds. Encouraged heart healthy diet such as the DASH diet and exercise as tolerated.  °

## 2020-02-23 NOTE — Assessment & Plan Note (Signed)
Hydrate and monitor 
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
## 2020-02-24 ENCOUNTER — Ambulatory Visit (INDEPENDENT_AMBULATORY_CARE_PROVIDER_SITE_OTHER): Payer: Medicare Other | Admitting: Plastic Surgery

## 2020-02-24 ENCOUNTER — Encounter: Payer: Self-pay | Admitting: Plastic Surgery

## 2020-02-24 ENCOUNTER — Telehealth: Payer: Self-pay

## 2020-02-24 VITALS — BP 151/89 | HR 74 | Temp 97.0°F | Ht 69.0 in | Wt 217.0 lb

## 2020-02-24 DIAGNOSIS — T25311D Burn of third degree of right ankle, subsequent encounter: Secondary | ICD-10-CM

## 2020-02-24 LAB — COMPREHENSIVE METABOLIC PANEL
ALT: 24 U/L (ref 0–53)
AST: 17 U/L (ref 0–37)
Albumin: 4.3 g/dL (ref 3.5–5.2)
Alkaline Phosphatase: 107 U/L (ref 39–117)
BUN: 20 mg/dL (ref 6–23)
CO2: 28 mEq/L (ref 19–32)
Calcium: 9.1 mg/dL (ref 8.4–10.5)
Chloride: 102 mEq/L (ref 96–112)
Creatinine, Ser: 1 mg/dL (ref 0.40–1.50)
GFR: 73.34 mL/min (ref >60.00)
Glucose, Bld: 99 mg/dL (ref 70–99)
Potassium: 4.5 mEq/L (ref 3.5–5.1)
Sodium: 140 mEq/L (ref 135–145)
Total Bilirubin: 0.4 mg/dL (ref 0.2–1.2)
Total Protein: 6.8 g/dL (ref 6.0–8.3)

## 2020-02-24 LAB — CBC
HCT: 39.7 % (ref 39.0–52.0)
Hemoglobin: 13 g/dL (ref 13.0–17.0)
MCHC: 32.8 g/dL (ref 30.0–36.0)
MCV: 94.4 fl (ref 78.0–100.0)
Platelets: 211 10*3/uL (ref 150.0–400.0)
RBC: 4.2 Mil/uL — ABNORMAL LOW (ref 4.22–5.81)
RDW: 15.9 % — ABNORMAL HIGH (ref 11.5–15.5)
WBC: 7.1 10*3/uL (ref 4.0–10.5)

## 2020-02-24 LAB — LIPID PANEL
Cholesterol: 160 mg/dL (ref 0–200)
HDL: 35.7 mg/dL — ABNORMAL LOW (ref >39.00)
LDL Cholesterol: 100 mg/dL — ABNORMAL HIGH (ref 0–99)
NonHDL: 124.6
Total CHOL/HDL Ratio: 4
Triglycerides: 121 mg/dL (ref 0.0–149.0)
VLDL: 24.2 mg/dL (ref 0.0–40.0)

## 2020-02-24 LAB — HEMOGLOBIN A1C: Hgb A1c MFr Bld: 6.4 % (ref 4.6–6.5)

## 2020-02-24 LAB — TSH: TSH: 3.19 u[IU]/mL (ref 0.35–4.50)

## 2020-02-24 NOTE — Telephone Encounter (Signed)
Faxed order to [facility] for ace wrap-weekly, collagenase santyl-daily, mepilex boarder dressing-daily.

## 2020-02-25 ENCOUNTER — Encounter: Payer: Self-pay | Admitting: Family Medicine

## 2020-02-25 DIAGNOSIS — T25311A Burn of third degree of right ankle, initial encounter: Secondary | ICD-10-CM | POA: Diagnosis not present

## 2020-02-25 DIAGNOSIS — L97312 Non-pressure chronic ulcer of right ankle with fat layer exposed: Secondary | ICD-10-CM | POA: Diagnosis not present

## 2020-02-25 NOTE — Telephone Encounter (Signed)
Appt made

## 2020-02-26 ENCOUNTER — Telehealth: Payer: Self-pay | Admitting: Surgical

## 2020-02-26 MED ORDER — COLLAGENASE 250 UNIT/GM EX OINT: 1.0000 "application " | TOPICAL_OINTMENT | Freq: Every day | CUTANEOUS | 0 refills | Status: DC

## 2020-02-26 NOTE — Telephone Encounter (Signed)
Order for collagenase

## 2020-03-01 ENCOUNTER — Other Ambulatory Visit: Payer: Self-pay

## 2020-03-01 ENCOUNTER — Encounter (INDEPENDENT_AMBULATORY_CARE_PROVIDER_SITE_OTHER): Payer: Self-pay | Admitting: Family Medicine

## 2020-03-01 ENCOUNTER — Ambulatory Visit (INDEPENDENT_AMBULATORY_CARE_PROVIDER_SITE_OTHER): Payer: Medicare Other | Admitting: Family Medicine

## 2020-03-01 VITALS — BP 120/64 | HR 81 | Temp 97.6°F | Ht 69.0 in | Wt 223.0 lb

## 2020-03-01 DIAGNOSIS — E669 Obesity, unspecified: Secondary | ICD-10-CM | POA: Diagnosis not present

## 2020-03-01 DIAGNOSIS — E1165 Type 2 diabetes mellitus with hyperglycemia: Secondary | ICD-10-CM | POA: Diagnosis not present

## 2020-03-01 DIAGNOSIS — Z6833 Body mass index (BMI) 33.0-33.9, adult: Secondary | ICD-10-CM

## 2020-03-01 DIAGNOSIS — F418 Other specified anxiety disorders: Secondary | ICD-10-CM | POA: Diagnosis not present

## 2020-03-01 MED ORDER — BUPROPION HCL ER (SR) 100 MG PO TB12: 100.0000 mg | ORAL_TABLET | Freq: Every day | ORAL | 0 refills | Status: DC
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
## 2020-03-06 DIAGNOSIS — J9601 Acute respiratory failure with hypoxia: Secondary | ICD-10-CM | POA: Diagnosis not present

## 2020-03-06 DIAGNOSIS — J449 Chronic obstructive pulmonary disease, unspecified: Secondary | ICD-10-CM | POA: Diagnosis not present
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
## 2020-03-09 ENCOUNTER — Other Ambulatory Visit: Payer: Self-pay

## 2020-03-09 ENCOUNTER — Encounter: Payer: Self-pay | Admitting: Plastic Surgery

## 2020-03-09 ENCOUNTER — Ambulatory Visit (INDEPENDENT_AMBULATORY_CARE_PROVIDER_SITE_OTHER): Payer: Medicare Other | Admitting: Plastic Surgery

## 2020-03-09 VITALS — BP 148/83 | HR 78 | Temp 98.4°F | Ht 69.0 in | Wt 222.0 lb

## 2020-03-09 DIAGNOSIS — T25011D Burn of unspecified degree of right ankle, subsequent encounter: Secondary | ICD-10-CM

## 2020-03-14 ENCOUNTER — Telehealth: Payer: Self-pay

## 2020-03-14 NOTE — Telephone Encounter (Signed)
Patient called wanting update on order for medical supplies.

## 2020-03-14 NOTE — Telephone Encounter (Signed)
Returned patients call. Advised that [facility] had tried to contact him, but could not reach him. Provided phone number to patient.

## 2020-03-15 ENCOUNTER — Telehealth: Payer: Self-pay

## 2020-03-15 NOTE — Telephone Encounter (Signed)
Patient called yesterday and said he only got a few mepilex and no santyl. This order was faxed in 02/24/20. [facility] received it, but did not tell us that santyl is a prescription and they do not provide product. Was notified when I called [facility] that his insurance only pays for 12.  However, they did not offer patient a discount or to pay out of pocket for additional supplies.  They are calling patient back today. Can you send in Santyl to his pharmacy. Sent message to Venetia Night to send in [place]

## 2020-03-16 ENCOUNTER — Telehealth: Payer: Self-pay | Admitting: *Deleted

## 2020-03-16 ENCOUNTER — Telehealth: Payer: Self-pay

## 2020-03-16 NOTE — Telephone Encounter (Signed)
Mr. Weisel, I just found out that Santyl is not a product that [facility] has. I have attached a coupon that you can use at your pharmacy to pick up.  If you have any questions, please give our office a call.  Windy Canny, CMA

## 2020-03-16 NOTE — Telephone Encounter (Signed)
Faxed order to [facility] on (03/10/20) for supplies for the patient.  Confirmation received.  Copy scanned into the chart.//AB/CMA

## 2020-03-17 ENCOUNTER — Encounter (INDEPENDENT_AMBULATORY_CARE_PROVIDER_SITE_OTHER): Payer: Self-pay | Admitting: Family Medicine

## 2020-03-17 ENCOUNTER — Other Ambulatory Visit: Payer: Self-pay

## 2020-03-17 ENCOUNTER — Ambulatory Visit (INDEPENDENT_AMBULATORY_CARE_PROVIDER_SITE_OTHER): Payer: Medicare Other | Admitting: Family Medicine

## 2020-03-17 VITALS — BP 139/75 | HR 85 | Temp 97.9°F | Ht 69.0 in | Wt 225.0 lb

## 2020-03-17 DIAGNOSIS — I10 Essential (primary) hypertension: Secondary | ICD-10-CM | POA: Diagnosis not present

## 2020-03-17 DIAGNOSIS — E669 Obesity, unspecified: Secondary | ICD-10-CM

## 2020-03-17 DIAGNOSIS — Z6833 Body mass index (BMI) 33.0-33.9, adult: Secondary | ICD-10-CM | POA: Diagnosis not present

## 2020-03-17 DIAGNOSIS — E1165 Type 2 diabetes mellitus with hyperglycemia: Secondary | ICD-10-CM

## 2020-03-17 MED ORDER — GLIMEPIRIDE 2 MG PO TABS: 2.0000 mg | ORAL_TABLET | Freq: Two times a day (BID) | ORAL | 0 refills | Status: DC

## 2020-03-18 ENCOUNTER — Other Ambulatory Visit: Payer: Self-pay | Admitting: Family Medicine

## 2020-03-21 NOTE — Progress Notes (Signed)
Chief Complaint:   OBESITY [name] is here to discuss his progress with his obesity treatment plan along with follow-up of his obesity related diagnoses. [name] is on the Category 3 Plan and states he is following his eating plan approximately 65% of the time. [name] states he is exercising for 0 minutes 0 times per week.  Today's visit was #: 54 Starting weight: 236 lbs Starting date: 08/25/2018 Today's weight: 225 lbs Today's date: 03/17/2020 Total lbs lost to date: 11 lbs Total lbs lost since last in-office visit: 0  Interim History: [name] has been working on following the plan as strictly as he can.  His recent A1c is 6.4.  He is having 2 scrambled eggs and 4 strips of bacon for breakfast.  For lunch, he has meat and vegetables (around 6 ounces protein).  Dinner is a beef pot pie and he reports that his wife is estimating the protein amount as his wife is measuring  He is doing sugar-free cookies and nuts for snacks.  Subjective:   1. Type 2 diabetes mellitus with hyperglycemia, without long-term current use of insulin (HCC) [name] denies hypoglycemia.  He is taking metformin and glimepiride.    Lab Results  Component Value Date   HGBA1C 6.4 02/23/2020   HGBA1C 6.5 (H) 12/06/2019   HGBA1C 5.6 07/01/2019   Lab Results  Component Value Date   MICROALBUR 0.6 12/12/2012   LDLCALC 100 (H) 02/23/2020   CREATININE 1.00 02/23/2020   Lab Results  Component Value Date   INSULIN 15.3 12/15/2018   INSULIN 29.0 (H) 08/25/2018   2. Essential hypertension Review: taking medications as instructed, no medication side effects noted, no chest pain on exertion, no dyspnea on exertion, no swelling of ankles.  Blood pressure is controlled today.  BP Readings from Last 3 Encounters:  03/17/20 139/75  03/09/20 (!) 148/83  03/01/20 120/64   Assessment/Plan:   1. Type 2 diabetes mellitus with hyperglycemia, without long-term current use of insulin (HCC) Good blood sugar control is  important to decrease the likelihood of diabetic complications such as nephropathy, neuropathy, limb loss, blindness, coronary artery disease, and death. Intensive lifestyle modification including diet, exercise and weight loss are the first line of treatment for diabetes.  [name] will decrease glimepiride to twice daily dosing.  2. Essential hypertension [name] is working on healthy weight loss and exercise to improve blood pressure control. We will watch for signs of hypotension as he continues his lifestyle modifications.  3. Class 1 obesity with serious comorbidity and body mass index (BMI) of 33.0 to 33.9 in adult, unspecified obesity type [name] is currently in the action stage of change. As such, his goal is to continue with weight loss efforts. He has agreed to the Category 3 Plan.   Exercise goals: Older adults should follow the adult guidelines. When older adults cannot meet the adult guidelines, they should be as physically active as their abilities and conditions will allow.   Behavioral modification strategies: increasing lean protein intake, increasing vegetables, meal planning and cooking strategies, keeping healthy foods in the home and planning for success.  [name] has agreed to follow-up with our clinic in 2 weeks. He was informed of the importance of frequent follow-up visits to maximize his success with intensive lifestyle modifications for his multiple health conditions.   Objective:   Blood pressure 139/75, pulse 85, temperature 97.9 F (36.6 C), temperature source Oral, height 5\' 9"  (1.753 m), weight 225 lb (102.1 kg), SpO2 93 %. Body  mass index is 33.23 kg/m.  General: Cooperative, alert, well developed, in no acute distress. HEENT: Conjunctivae and lids unremarkable. Cardiovascular: Regular rhythm.  Lungs: Normal work of breathing. Neurologic: No focal deficits.   Lab Results  Component Value Date   CREATININE 1.00 02/23/2020   BUN 20 02/23/2020   NA 140 02/23/2020    K 4.5 02/23/2020   CL 102 02/23/2020   CO2 28 02/23/2020   Lab Results  Component Value Date   ALT 24 02/23/2020   AST 17 02/23/2020   ALKPHOS 107 02/23/2020   BILITOT 0.4 02/23/2020   Lab Results  Component Value Date   HGBA1C 6.4 02/23/2020   HGBA1C 6.5 (H) 12/06/2019   HGBA1C 5.6 07/01/2019   HGBA1C 5.9 (H) 04/24/2019   HGBA1C 6.3 (H) 12/15/2018   Lab Results  Component Value Date   INSULIN 15.3 12/15/2018   INSULIN 29.0 (H) 08/25/2018   Lab Results  Component Value Date   TSH 3.19 02/23/2020   Lab Results  Component Value Date   CHOL 160 02/23/2020   HDL 35.70 (L) 02/23/2020   LDLCALC 100 (H) 02/23/2020   LDLDIRECT 104.9 12/12/2012   TRIG 121.0 02/23/2020   CHOLHDL 4 02/23/2020   Lab Results  Component Value Date   WBC 7.1 02/23/2020   HGB 13.0 02/23/2020   HCT 39.7 02/23/2020   MCV 94.4 02/23/2020   PLT 211.0 02/23/2020   Lab Results  Component Value Date   FERRITIN 311 12/04/2019   Obesity Behavioral Intervention Documentation for Insurance:   Approximately 15 minutes were spent on the discussion below.  ASK: We discussed the diagnosis of obesity with [name] today and [name] agreed to give [name] permission to discuss obesity behavioral modification therapy today.  ASSESS: [name] has the diagnosis of obesity and his BMI today is 33.2. [name] is in the action stage of change.   ADVISE: [name] was educated on the multiple health risks of obesity as well as the benefit of weight loss to improve his health. He was advised of the need for long term treatment and the importance of lifestyle modifications to improve his current health and to decrease his risk of future health problems.  AGREE: Multiple dietary modification options and treatment options were discussed and [name] agreed to follow the recommendations documented in the above note.  ARRANGE: [name] was educated on the importance of frequent visits to treat obesity as outlined per CMS and USPSTF  guidelines and agreed to schedule his next follow up appointment today.  Attestation Statements:   Reviewed by clinician on day of visit: allergies, medications, problem list, medical history, surgical history, family history, social history, and previous encounter notes.  I, Water quality scientist, CMA, am acting as transcriptionist for Coralie Common, MD.  I have reviewed the above documentation for accuracy and completeness, and I agree with the above. - Jinny Blossom, MD

## 2020-03-28 ENCOUNTER — Ambulatory Visit: Payer: Medicare Other | Admitting: Podiatry

## 2020-03-28 ENCOUNTER — Other Ambulatory Visit: Payer: Self-pay

## 2020-03-28 ENCOUNTER — Encounter: Payer: Self-pay | Admitting: Podiatry

## 2020-03-28 DIAGNOSIS — E1142 Type 2 diabetes mellitus with diabetic polyneuropathy: Secondary | ICD-10-CM

## 2020-03-28 DIAGNOSIS — M79674 Pain in right toe(s): Secondary | ICD-10-CM

## 2020-03-28 DIAGNOSIS — L84 Corns and callosities: Secondary | ICD-10-CM | POA: Diagnosis not present

## 2020-03-28 DIAGNOSIS — B351 Tinea unguium: Secondary | ICD-10-CM

## 2020-03-28 DIAGNOSIS — M79675 Pain in left toe(s): Secondary | ICD-10-CM
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
## 2020-03-31 ENCOUNTER — Other Ambulatory Visit (INDEPENDENT_AMBULATORY_CARE_PROVIDER_SITE_OTHER): Payer: Self-pay | Admitting: Family Medicine

## 2020-03-31 DIAGNOSIS — F418 Other specified anxiety disorders: Secondary | ICD-10-CM
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
## 2020-04-06 ENCOUNTER — Ambulatory Visit (INDEPENDENT_AMBULATORY_CARE_PROVIDER_SITE_OTHER): Payer: Medicare Other | Admitting: Family Medicine

## 2020-04-06 ENCOUNTER — Encounter (INDEPENDENT_AMBULATORY_CARE_PROVIDER_SITE_OTHER): Payer: Self-pay | Admitting: Family Medicine

## 2020-04-06 ENCOUNTER — Other Ambulatory Visit: Payer: Self-pay

## 2020-04-06 VITALS — BP 151/74 | HR 54 | Temp 97.6°F | Ht 69.0 in | Wt 223.0 lb

## 2020-04-06 DIAGNOSIS — E1165 Type 2 diabetes mellitus with hyperglycemia: Secondary | ICD-10-CM | POA: Diagnosis not present

## 2020-04-06 DIAGNOSIS — E669 Obesity, unspecified: Secondary | ICD-10-CM

## 2020-04-06 DIAGNOSIS — Z6833 Body mass index (BMI) 33.0-33.9, adult: Secondary | ICD-10-CM

## 2020-04-06 DIAGNOSIS — J9601 Acute respiratory failure with hypoxia: Secondary | ICD-10-CM | POA: Diagnosis not present

## 2020-04-06 DIAGNOSIS — J449 Chronic obstructive pulmonary disease, unspecified: Secondary | ICD-10-CM | POA: Diagnosis not present

## 2020-04-06 DIAGNOSIS — I10 Essential (primary) hypertension: Secondary | ICD-10-CM
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
## 2020-04-07 ENCOUNTER — Encounter: Payer: Self-pay | Admitting: Plastic Surgery

## 2020-04-07 ENCOUNTER — Telehealth: Payer: Self-pay | Admitting: Plastic Surgery

## 2020-04-07 ENCOUNTER — Ambulatory Visit (INDEPENDENT_AMBULATORY_CARE_PROVIDER_SITE_OTHER): Payer: Medicare Other | Admitting: Plastic Surgery

## 2020-04-07 VITALS — BP 127/71 | HR 72 | Temp 97.5°F | Ht 69.0 in | Wt 225.0 lb

## 2020-04-07 DIAGNOSIS — T25011D Burn of unspecified degree of right ankle, subsequent encounter: Secondary | ICD-10-CM

## 2020-04-07 NOTE — Telephone Encounter (Signed)
Patient's wife called and lvm to say they called [facility] regarding Sterilite 4x4 and they said they needed a doctor's order to request supplies. Please fax or call in order to [facility] so they can get additional supplies. She left a vendor parts number [id number]

## 2020-04-07 NOTE — Telephone Encounter (Signed)
Sent message through patient's portal.  [name],  I received your message in regards to requesting 4x4 sterile guaze. I tried to call you back, but your voicemail is full. I have spoken with Venetia Night, our PA you saw today. She would like for you to continue the daily changes using xeroforms ,or Vaseline, and wrap with kerlix to avoid any adhesive on new pink skin. She does not want anything dry to touch the wound.   If you have any questions, please give [name] a call.   Sincerely,   Windy Canny, CMA

## 2020-04-08 ENCOUNTER — Telehealth: Payer: Self-pay

## 2020-04-08 DIAGNOSIS — L97521 Non-pressure chronic ulcer of other part of left foot limited to breakdown of skin: Secondary | ICD-10-CM | POA: Diagnosis not present

## 2020-04-08 NOTE — Telephone Encounter (Signed)
Called [facility] (for [place]) and spoke with Tori. Requested Xeroform and Kerlix to be sent to patient.
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
## 2020-04-25 ENCOUNTER — Other Ambulatory Visit: Payer: Self-pay

## 2020-04-25 ENCOUNTER — Encounter (INDEPENDENT_AMBULATORY_CARE_PROVIDER_SITE_OTHER): Payer: Self-pay | Admitting: Adult Health

## 2020-04-25 ENCOUNTER — Ambulatory Visit (INDEPENDENT_AMBULATORY_CARE_PROVIDER_SITE_OTHER): Payer: Medicare Other | Admitting: Adult Health

## 2020-04-25 VITALS — BP 149/69 | HR 77 | Temp 97.9°F | Ht 69.0 in | Wt 223.0 lb

## 2020-04-25 DIAGNOSIS — E1165 Type 2 diabetes mellitus with hyperglycemia: Secondary | ICD-10-CM

## 2020-04-25 DIAGNOSIS — E669 Obesity, unspecified: Secondary | ICD-10-CM

## 2020-04-25 DIAGNOSIS — Z6833 Body mass index (BMI) 33.0-33.9, adult: Secondary | ICD-10-CM

## 2020-04-25 DIAGNOSIS — F418 Other specified anxiety disorders: Secondary | ICD-10-CM

## 2020-04-25 MED ORDER — BUPROPION HCL ER (SR) 100 MG PO TB12: 100.0000 mg | ORAL_TABLET | Freq: Every day | ORAL | 0 refills | Status: DC
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
## 2020-05-03 DIAGNOSIS — H43811 Vitreous degeneration, right eye: Secondary | ICD-10-CM | POA: Diagnosis not present

## 2020-05-03 DIAGNOSIS — H401131 Primary open-angle glaucoma, bilateral, mild stage: Secondary | ICD-10-CM | POA: Diagnosis not present

## 2020-05-05 ENCOUNTER — Encounter (INDEPENDENT_AMBULATORY_CARE_PROVIDER_SITE_OTHER): Payer: Self-pay

## 2020-05-05 ENCOUNTER — Ambulatory Visit (INDEPENDENT_AMBULATORY_CARE_PROVIDER_SITE_OTHER): Payer: Medicare Other | Admitting: Plastic Surgery

## 2020-05-05 ENCOUNTER — Encounter: Payer: Self-pay | Admitting: Plastic Surgery

## 2020-05-05 ENCOUNTER — Other Ambulatory Visit: Payer: Self-pay

## 2020-05-05 VITALS — BP 116/58 | HR 67 | Temp 98.0°F

## 2020-05-05 DIAGNOSIS — T25311D Burn of third degree of right ankle, subsequent encounter: Secondary | ICD-10-CM

## 2020-05-05 NOTE — Progress Notes (Signed)
Patient is a 74 year old male here for follow-up after undergoing debridement of full-thickness burn on his right ankle (7 x 7 cm) and placement of split thickness skin graft on 01/19/2020 with Dr. Claudia Desanctis.  Graft donor site was right thigh.  ~ 15 weeks PO Today patient is accompanied by his wife and grandson.  Patient reports he is doing well.  Wound has healed nicely.  Small scab remaining in the center area.  No signs of infection or drainage.  Patient denies fever, shortness of breath, chest pain, nausea/vomiting.  He may shower normally, avoid any rough scrubbing of the new tissue.  He does not need to have any kind of dressing covering the wound, however he should protect it if anything is going to rub against it or if it would get extremely dirty.  Discussed protecting the skin from any kind of sunburn.  He may go swimming in their pool; but try not to keep it submerged under water for long periods of time. Follow-up in 2 to 3 weeks.  May cancel if no issues. Call office with any questions/concerns.      Pictures were obtained of the patient and placed in the chart with the patient's or guardian's permission.  The [place] was signed into law in 2016 which includes the topic of electronic health records.  This provides immediate access to information in MyChart.  This includes consultation notes, operative notes, office notes, lab results and pathology reports.  If you have any questions about what you read please let us know at your next visit or call us at the office.  We are right here with you.

## 2020-05-06 ENCOUNTER — Telehealth: Payer: Self-pay | Admitting: *Deleted

## 2020-05-06 DIAGNOSIS — J449 Chronic obstructive pulmonary disease, unspecified: Secondary | ICD-10-CM | POA: Diagnosis not present

## 2020-05-06 DIAGNOSIS — J9601 Acute respiratory failure with hypoxia: Secondary | ICD-10-CM | POA: Diagnosis not present

## 2020-05-06 DIAGNOSIS — L97521 Non-pressure chronic ulcer of other part of left foot limited to breakdown of skin: Secondary | ICD-10-CM | POA: Diagnosis not present

## 2020-05-06 NOTE — Telephone Encounter (Signed)
Received standard written order via of fax from [facility].  Requesting signature.    Orders signed and faxed.  Confirmation received and copy scanned into the chart.//AB/CMA

## 2020-05-10 ENCOUNTER — Ambulatory Visit (INDEPENDENT_AMBULATORY_CARE_PROVIDER_SITE_OTHER): Payer: Medicare Other | Admitting: Adult Health

## 2020-05-10 ENCOUNTER — Other Ambulatory Visit: Payer: Self-pay

## 2020-05-10 ENCOUNTER — Encounter (INDEPENDENT_AMBULATORY_CARE_PROVIDER_SITE_OTHER): Payer: Self-pay | Admitting: Adult Health

## 2020-05-10 VITALS — BP 122/75 | HR 76 | Temp 97.7°F | Ht 69.0 in | Wt 219.0 lb

## 2020-05-10 DIAGNOSIS — I152 Hypertension secondary to endocrine disorders: Secondary | ICD-10-CM

## 2020-05-10 DIAGNOSIS — I10 Essential (primary) hypertension: Secondary | ICD-10-CM

## 2020-05-10 DIAGNOSIS — F32A Depression, unspecified: Secondary | ICD-10-CM | POA: Insufficient documentation

## 2020-05-10 DIAGNOSIS — E669 Obesity, unspecified: Secondary | ICD-10-CM

## 2020-05-10 DIAGNOSIS — E119 Type 2 diabetes mellitus without complications: Secondary | ICD-10-CM | POA: Diagnosis not present

## 2020-05-10 DIAGNOSIS — Z6832 Body mass index (BMI) 32.0-32.9, adult: Secondary | ICD-10-CM

## 2020-05-10 DIAGNOSIS — F3289 Other specified depressive episodes: Secondary | ICD-10-CM

## 2020-05-10 DIAGNOSIS — E1159 Type 2 diabetes mellitus with other circulatory complications: Secondary | ICD-10-CM

## 2020-05-10 NOTE — Progress Notes (Addendum)
Chief Complaint:   OBESITY [name] is here to discuss his progress with his obesity treatment plan along with follow-up of his obesity related diagnoses. [name] is on the Category 3 Plan and states he is following his eating plan approximately 75% of the time. [name] states he is exercising 0 minutes 0 times per week.  Today's visit was #: 74 Starting weight: 236 lbs Starting date: 08/25/2018 Today's weight: 219 lbs Today's date: 05/10/2020 Total lbs lost to date: 17 Total lbs lost since last in-office visit: 4  Interim History: [name] reports a slight increase in energy levels since starting the program. He denies excessive cravings or polyphagia when he eats all food on the Category 3 meal plan. Since restarting bupropion SR 100 mg, he has noticed cessation of carbohydrate cravings.  Subjective:   Type 2 diabetes mellitus without complication, without long-term current use of insulin ([place]). [name] is on metformin 1000 mg daily and glimepiride 2 mg QD. Ambulatory fasting blood glucose levels are in the 130's with postprandial readings in the 150's. He denies episodes of hypoglycemia.  Lab Results  Component Value Date   HGBA1C 6.4 02/23/2020   HGBA1C 6.5 (H) 12/06/2019   HGBA1C 5.6 07/01/2019   Lab Results  Component Value Date   MICROALBUR 0.6 12/12/2012   LDLCALC 100 (H) 02/23/2020   CREATININE 1.00 02/23/2020   Lab Results  Component Value Date   INSULIN 15.3 12/15/2018   INSULIN 29.0 (H) 08/25/2018   Hypertension associated with diabetes ([place]). Blood pressure is well controlled at today's office visit. Seward is on amlodipine 5 mg daily, carvedilol 25 mg 1/2 tab BID, and losartan 50 mg daily.  BP Readings from Last 3 Encounters:  05/10/20 122/75  05/05/20 (!) 116/58  04/25/20 (!) 149/69   Lab Results  Component Value Date   CREATININE 1.00 02/23/2020   CREATININE 0.76 01/19/2020   CREATININE 0.77 12/08/2019   Other depression,with emotional eating. [name]  is struggling with emotional eating and using food for comfort to the extent that it is negatively impacting his health. He has been working on behavior modification techniques to help reduce his emotional eating and has been somewhat successful. He shows no sign of suicidal or homicidal ideations. [name] was previously on bupropion SR 150 mg daily and then titrated off due to prescription ineffectiveness. In June excessive cravings made it difficult to stay on plan and bupropion SR 100 mg daily was restarted. He now reports complete cessation of carbohydrate cravings. Blood pressure is at goal today.  Assessment/Plan:   Type 2 diabetes mellitus without complication, without long-term current use of insulin ([name]). Good blood sugar control is important to decrease the likelihood of diabetic complications such as nephropathy, neuropathy, limb loss, blindness, coronary artery disease, and death. Intensive lifestyle modification including diet, exercise and weight loss are the first line of treatment for diabetes.  [name] will continue his current anti-diabetic regimen and will have labs checked at his next office visit.  Hypertension associated with diabetes ([place]). [name] is working on healthy weight loss and exercise to improve blood pressure control. We will watch for signs of hypotension as he continues his lifestyle modifications. He will continue his current anti-hypertensive regimen and will have labs checked at his next office visit.  Other depression,with emotional eating. Behavior modification techniques were discussed today to help [name] deal with his emotional/non-hunger eating behaviors.  Orders and follow up as documented in patient record. [name] will continue his current bupropion regimen (  no medication refill today).  Class 1 obesity with serious comorbidity and body mass index (BMI) of 32.0 to 32.9 in adult, unspecified obesity type.  [name] is currently in the action stage of change. As such,  his goal is to continue with weight loss efforts. He has agreed to the Category 3 Plan.   Exercise goals: No exercise has been prescribed at this time.  Behavioral modification strategies: increasing lean protein intake, decreasing simple carbohydrates, no skipping meals, meal planning and cooking strategies and better snacking choices.  [name] has agreed to follow-up with our clinic in 2 weeks. He was informed of the importance of frequent follow-up visits to maximize his success with intensive lifestyle modifications for his multiple health conditions.   Objective:   Blood pressure 122/75, pulse 76, temperature 97.7 F (36.5 C), temperature source Oral, height 5\' 9"  (1.753 m), weight 219 lb (99.3 kg), SpO2 95 %. Body mass index is 32.34 kg/m.  General: Cooperative, alert, well developed, in no acute distress. HEENT: Conjunctivae and lids unremarkable. Cardiovascular: Regular rhythm.  Lungs: Normal work of breathing. Neurologic: No focal deficits.   Lab Results  Component Value Date   CREATININE 1.00 02/23/2020   BUN 20 02/23/2020   NA 140 02/23/2020   K 4.5 02/23/2020   CL 102 02/23/2020   CO2 28 02/23/2020   Lab Results  Component Value Date   ALT 24 02/23/2020   AST 17 02/23/2020   ALKPHOS 107 02/23/2020   BILITOT 0.4 02/23/2020   Lab Results  Component Value Date   HGBA1C 6.4 02/23/2020   HGBA1C 6.5 (H) 12/06/2019   HGBA1C 5.6 07/01/2019   HGBA1C 5.9 (H) 04/24/2019   HGBA1C 6.3 (H) 12/15/2018   Lab Results  Component Value Date   INSULIN 15.3 12/15/2018   INSULIN 29.0 (H) 08/25/2018   Lab Results  Component Value Date   TSH 3.19 02/23/2020   Lab Results  Component Value Date   CHOL 160 02/23/2020   HDL 35.70 (L) 02/23/2020   LDLCALC 100 (H) 02/23/2020   LDLDIRECT 104.9 12/12/2012   TRIG 121.0 02/23/2020   CHOLHDL 4 02/23/2020   Lab Results  Component Value Date   WBC 7.1 02/23/2020   HGB 13.0 02/23/2020   HCT 39.7 02/23/2020   MCV 94.4  02/23/2020   PLT 211.0 02/23/2020   Lab Results  Component Value Date   FERRITIN 311 12/04/2019   Obesity Behavioral Intervention Documentation for Insurance:   Approximately 15 minutes were spent on the discussion below.  ASK: We discussed the diagnosis of obesity with [name] today and Giuliano agreed to give [name] permission to discuss obesity behavioral modification therapy today.  ASSESS: Casin has the diagnosis of obesity and his BMI today is 32.4. Jarrius is in the action stage of change.   ADVISE: Ayiden was educated on the multiple health risks of obesity as well as the benefit of weight loss to improve his health. He was advised of the need for long term treatment and the importance of lifestyle modifications to improve his current health and to decrease his risk of future health problems.  AGREE: Multiple dietary modification options and treatment options were discussed and Keavon agreed to follow the recommendations documented in the above note.  ARRANGE: Karey was educated on the importance of frequent visits to treat obesity as outlined per CMS and USPSTF guidelines and agreed to schedule his next follow up appointment today.  Attestation Statements:   Reviewed by clinician on day of visit: allergies, medications,  problem list, medical history, surgical history, family history, social history, and previous encounter notes.  I, Michaelene Song, am acting as Location manager for [employer], NP-C  I have reviewed the above documentation for accuracy and completeness, and I agree with the above. -  Esaw Grandchild, NP

## 2020-05-14 ENCOUNTER — Other Ambulatory Visit (INDEPENDENT_AMBULATORY_CARE_PROVIDER_SITE_OTHER): Payer: Self-pay | Admitting: Adult Health

## 2020-05-14 ENCOUNTER — Encounter: Payer: Self-pay | Admitting: Family Medicine

## 2020-05-14 DIAGNOSIS — F418 Other specified anxiety disorders: Secondary | ICD-10-CM

## 2020-05-16 ENCOUNTER — Ambulatory Visit: Payer: Medicare Other | Admitting: Physician Assistant

## 2020-05-16 ENCOUNTER — Other Ambulatory Visit: Payer: Self-pay

## 2020-05-16 ENCOUNTER — Encounter: Payer: Self-pay | Admitting: Physician Assistant

## 2020-05-16 DIAGNOSIS — Z85828 Personal history of other malignant neoplasm of skin: Secondary | ICD-10-CM

## 2020-05-16 DIAGNOSIS — L570 Actinic keratosis: Secondary | ICD-10-CM

## 2020-05-16 DIAGNOSIS — L409 Psoriasis, unspecified: Secondary | ICD-10-CM

## 2020-05-16 MED ORDER — TRIAMCINOLONE ACETONIDE 10 MG/ML IJ SUSP
10.0000 mg | Freq: Once | INTRAMUSCULAR | Status: AC
  Administered 2020-05-16: 10 mg

## 2020-05-16 MED ORDER — CLOPIDOGREL BISULFATE 75 MG PO TABS: 75.0000 mg | ORAL_TABLET | Freq: Every day | ORAL | 3 refills | Status: DC

## 2020-05-16 NOTE — Progress Notes (Addendum)
   Follow up Visit  Subjective  [name] is a 74 y.o. male who presents for the following: Follow-up (otezla samples needed also scalp flare). He feels for the most part his scalp does well and is much better but it flares and itches on occasion. He feels that the [place] [employer] is working well and he is having no problems with it. He has a place above his left ear on his scalp that itches and he is not sure if this is part of the psoriasis.  Objective  Well appearing patient in no apparent distress; mood and affect are within normal limits.  All skin waist up examined. No suspicious moles noted on back.   Objective  Left Forearm - Posterior (2), Left Mid Helix (3), Right Mid Helix (3): Erythematous patches with gritty scale.  Objective  Mid Occipital Scalp:  3 small plaques of psoriasis.  There is also a pink scaling area above the left ear. He is not sure if this is a lesion or part of his psoriasis. I gave him the option to biopsy. He does not want to do that today.  Objective  Left Dorsal Hand: Dyspigmented scar. 2019  Assessment & Plan  AK (actinic keratosis) (8) Left Forearm - Posterior (2); Left Mid Helix (3); Right Mid Helix (3)  Destruction of lesion - Left Forearm - Posterior, Left Mid Helix, Right Mid Helix Complexity: simple   Destruction method: cryotherapy   Informed consent: discussed and consent obtained   Timeout:  patient name, date of birth, surgical site, and procedure verified Lesion destroyed using liquid nitrogen: Yes   Outcome: patient tolerated procedure well with no complications    Psoriasis Mid Occipital Scalp  Otezla samples given so he can continue that. Two samples of Halog lotion given to try on psoriasis on scalp and area over left ear. He is to call if the patch over the left ear gets worse.  Intralesional injection - Mid Occipital Scalp Location: occipital scalp  Informed Consent: Discussed risks (infection, pain, bleeding, bruising,  thinning of the skin, loss of skin pigment,  Indentation, lack of resolution, and recurrence of lesion) and benefits of the procedure, as well as the alternatives. Informed consent was obtained. Preparation: The area was prepared in a standard fashion.   Procedure Details: An intralesional injection was performed with Kenalog 5 mg/cc. 1  cc in total were injected.  Total number of injections: 6  Plan: The patient was instructed on post-op care. Recommend OTC analgesia as needed for pain.   Personal history of squamous cell carcinoma of skin Left Dorsal Hand

## 2020-05-21 ENCOUNTER — Other Ambulatory Visit: Payer: Self-pay | Admitting: Family Medicine

## 2020-05-23 ENCOUNTER — Encounter (INDEPENDENT_AMBULATORY_CARE_PROVIDER_SITE_OTHER): Payer: Self-pay | Admitting: Adult Health

## 2020-05-23 ENCOUNTER — Other Ambulatory Visit: Payer: Self-pay

## 2020-05-23 ENCOUNTER — Ambulatory Visit (INDEPENDENT_AMBULATORY_CARE_PROVIDER_SITE_OTHER): Payer: Medicare Other | Admitting: Adult Health

## 2020-05-23 VITALS — BP 117/66 | HR 75 | Temp 97.7°F | Ht 69.0 in | Wt 216.0 lb

## 2020-05-23 DIAGNOSIS — Z6832 Body mass index (BMI) 32.0-32.9, adult: Secondary | ICD-10-CM

## 2020-05-23 DIAGNOSIS — E118 Type 2 diabetes mellitus with unspecified complications: Secondary | ICD-10-CM

## 2020-05-23 DIAGNOSIS — E669 Obesity, unspecified: Secondary | ICD-10-CM | POA: Diagnosis not present

## 2020-05-23 DIAGNOSIS — I10 Essential (primary) hypertension: Secondary | ICD-10-CM

## 2020-05-23 DIAGNOSIS — F3289 Other specified depressive episodes: Secondary | ICD-10-CM

## 2020-05-23 NOTE — Progress Notes (Signed)
Chief Complaint:   OBESITY [name] is here to discuss his progress with his obesity treatment plan along with follow-up of his obesity related diagnoses. [name] is on the Category 3 Plan and states he is following his eating plan approximately 70% of the time. [name] states he is exercising 0 minutes 0 times per week.  Today's visit was #: 40 Starting weight: 236 lbs Starting date: 08/25/2018 Today's weight: 216 lbs Today's date: 05/23/2020 Total lbs lost to date: 20 Total lbs lost since last in-office visit: 3  Interim History: [name] reports reduction of craving since restarting bupropion SR 100 mg daily. Blood pressure is excellent today. He is boring of the Category 3 meal plan and is interested in changing plans.  Subjective:   Essential hypertension. Blood pressure is at goal at today's office visit. [name] denies cardiac symptoms. He estimates to have quit tobacco use ~2008.  BP Readings from Last 3 Encounters:  05/23/20 117/66  05/10/20 122/75  05/05/20 (!) 116/58   Lab Results  Component Value Date   CREATININE 1.00 02/23/2020   CREATININE 0.76 01/19/2020   CREATININE 0.77 12/08/2019   Other depression,with emotional eating. [name] is struggling with emotional eating and using food for comfort to the extent that it is negatively impacting his health. He has been working on behavior modification techniques to help reduce his emotional eating and has been somewhat successful. He shows no sign of suicidal or homicidal ideations. [name] reports a reduction in cravings. Blood pressure is excellent today.  Type 2 diabetes mellitus with complications ([name]). [name] is on metformin and glimepiride, which he is tolerating well.  Lab Results  Component Value Date   HGBA1C 6.4 02/23/2020   HGBA1C 6.5 (H) 12/06/2019   HGBA1C 5.6 07/01/2019   Lab Results  Component Value Date   MICROALBUR 0.6 12/12/2012   LDLCALC 100 (H) 02/23/2020   CREATININE 1.00 02/23/2020   Lab  Results  Component Value Date   INSULIN 15.3 12/15/2018   INSULIN 29.0 (H) 08/25/2018   Assessment/Plan:   Essential hypertension. [name] is working on healthy weight loss and exercise to improve blood pressure control. We will watch for signs of hypotension as he continues his lifestyle modifications. [name] will continue his current anti-hypertensive regimen and comprehensive metabolic panel, Lipid Panel With LDL/HDL Ratio, Vitamin B12 labs will be checked today.  Other depression,with emotional eating. Behavior modification techniques were discussed today to help [name] deal with his emotional/non-hunger eating behaviors.  Orders and follow up as documented in patient record. [name] will continue bupropion SR 100 mg as directed. We discussed healthiest snacking options.  Type 2 diabetes mellitus with complications ([name]). Good blood sugar control is important to decrease the likelihood of diabetic complications such as nephropathy, neuropathy, limb loss, blindness, coronary artery disease, and death. Intensive lifestyle modification including diet, exercise and weight loss are the first line of treatment for diabetes. Favor will continue his current anti-diabetic regimen. Comprehensive metabolic panel, Hemoglobin A1c, Insulin, random, Vitamin B12 labs will be checked today.  Class 1 obesity with serious comorbidity and body mass index (BMI) of 32.0 to 32.9 in adult, unspecified obesity type.  [name] is currently in the action stage of change. As such, his goal is to continue with weight loss efforts. He has agreed to following a lower carbohydrate, vegetable and lean protein rich diet plan.   Handout was provided on High Protein/Low Calorie Snacks.  Exercise goals: No exercise has been prescribed at this time.  Behavioral modification strategies: increasing lean protein intake, decreasing simple carbohydrates, increasing vegetables, increasing water intake, meal planning and cooking strategies and  planning for success.  [name] has agreed to follow-up with our clinic in 2 weeks. He was informed of the importance of frequent follow-up visits to maximize his success with intensive lifestyle modifications for his multiple health conditions.   [name] was informed we would discuss his lab results at his next visit unless there is a critical issue that needs to be addressed sooner. [name] agreed to keep his next visit at the agreed upon time to discuss these results.  Objective:   Blood pressure 117/66, pulse 75, temperature 97.7 F (36.5 C), temperature source Oral, height 5\' 9"  (1.753 m), weight 216 lb (98 kg), SpO2 94 %. Body mass index is 31.9 kg/m.  General: Cooperative, alert, well developed, in no acute distress. HEENT: Conjunctivae and lids unremarkable. Cardiovascular: Regular rhythm.  Lungs: Normal work of breathing. Neurologic: No focal deficits.   Lab Results  Component Value Date   CREATININE 1.00 02/23/2020   BUN 20 02/23/2020   NA 140 02/23/2020   K 4.5 02/23/2020   CL 102 02/23/2020   CO2 28 02/23/2020   Lab Results  Component Value Date   ALT 24 02/23/2020   AST 17 02/23/2020   ALKPHOS 107 02/23/2020   BILITOT 0.4 02/23/2020   Lab Results  Component Value Date   HGBA1C 6.4 02/23/2020   HGBA1C 6.5 (H) 12/06/2019   HGBA1C 5.6 07/01/2019   HGBA1C 5.9 (H) 04/24/2019   HGBA1C 6.3 (H) 12/15/2018   Lab Results  Component Value Date   INSULIN 15.3 12/15/2018   INSULIN 29.0 (H) 08/25/2018   Lab Results  Component Value Date   TSH 3.19 02/23/2020   Lab Results  Component Value Date   CHOL 160 02/23/2020   HDL 35.70 (L) 02/23/2020   LDLCALC 100 (H) 02/23/2020   LDLDIRECT 104.9 12/12/2012   TRIG 121.0 02/23/2020   CHOLHDL 4 02/23/2020   Lab Results  Component Value Date   WBC 7.1 02/23/2020   HGB 13.0 02/23/2020   HCT 39.7 02/23/2020   MCV 94.4 02/23/2020   PLT 211.0 02/23/2020   Lab Results  Component Value Date   FERRITIN 311 12/04/2019    Obesity Behavioral Intervention Documentation for Insurance:   Approximately 15 minutes were spent on the discussion below.  ASK: We discussed the diagnosis of obesity with [name] today and [name] agreed to give [name] permission to discuss obesity behavioral modification therapy today.  ASSESS: [name] has the diagnosis of obesity and his BMI today is 32.4. Eain is in the action stage of change.   ADVISE: [name] was educated on the multiple health risks of obesity as well as the benefit of weight loss to improve his health. He was advised of the need for long term treatment and the importance of lifestyle modifications to improve his current health and to decrease his risk of future health problems.  AGREE: Multiple dietary modification options and treatment options were discussed and [name] agreed to follow the recommendations documented in the above note.  ARRANGE: [name] was educated on the importance of frequent visits to treat obesity as outlined per CMS and USPSTF guidelines and agreed to schedule his next follow up appointment today.  Attestation Statements:   Reviewed by clinician on day of visit: allergies, medications, problem list, medical history, surgical history, family history, social history, and previous encounter notes.  Migdalia Dk, am acting as Location manager for  Mina Marble, NP-C   I have reviewed the above documentation for accuracy and completeness, and I agree with the above. -  Esaw Grandchild, NP

## 2020-05-24 LAB — COMPREHENSIVE METABOLIC PANEL
ALT: 18 IU/L (ref 0–44)
AST: 15 IU/L (ref 0–40)
Albumin/Globulin Ratio: 2.1 (ref 1.2–2.2)
Albumin: 4.5 g/dL (ref 3.7–4.7)
Alkaline Phosphatase: 117 IU/L (ref 48–121)
BUN/Creatinine Ratio: 22 (ref 10–24)
BUN: 20 mg/dL (ref 8–27)
Bilirubin Total: 0.3 mg/dL (ref 0.0–1.2)
CO2: 26 mmol/L (ref 20–29)
Calcium: 9.8 mg/dL (ref 8.6–10.2)
Chloride: 102 mmol/L (ref 96–106)
Creatinine, Ser: 0.93 mg/dL (ref 0.76–1.27)
GFR calc Af Amer: 94 mL/min/{1.73_m2} (ref >59)
GFR calc non Af Amer: 82 mL/min/{1.73_m2} (ref >59)
Globulin, Total: 2.1 g/dL (ref 1.5–4.5)
Glucose: 109 mg/dL — ABNORMAL HIGH (ref 65–99)
Potassium: 4.9 mmol/L (ref 3.5–5.2)
Sodium: 140 mmol/L (ref 134–144)
Total Protein: 6.6 g/dL (ref 6.0–8.5)

## 2020-05-24 LAB — LIPID PANEL WITH LDL/HDL RATIO
Cholesterol, Total: 144 mg/dL (ref 100–199)
HDL: 34 mg/dL — ABNORMAL LOW (ref >39)
LDL Chol Calc (NIH): 84 mg/dL (ref 0–99)
LDL/HDL Ratio: 2.5 ratio (ref 0.0–3.6)
Triglycerides: 147 mg/dL (ref 0–149)
VLDL Cholesterol Cal: 26 mg/dL (ref 5–40)

## 2020-05-24 LAB — HEMOGLOBIN A1C
Est. average glucose Bld gHb Est-mCnc: 140 mg/dL
Hgb A1c MFr Bld: 6.5 % — ABNORMAL HIGH (ref 4.8–5.6)

## 2020-05-24 LAB — VITAMIN B12: Vitamin B-12: 1170 pg/mL (ref 232–1245)

## 2020-05-24 LAB — INSULIN, RANDOM: INSULIN: 16.7 u[IU]/mL (ref 2.6–24.9)

## 2020-05-26 ENCOUNTER — Ambulatory Visit (INDEPENDENT_AMBULATORY_CARE_PROVIDER_SITE_OTHER): Payer: Medicare Other | Admitting: Surgical

## 2020-05-26 ENCOUNTER — Encounter: Payer: Self-pay | Admitting: Surgical

## 2020-05-26 ENCOUNTER — Other Ambulatory Visit: Payer: Self-pay

## 2020-05-26 VITALS — BP 126/67 | HR 84 | Temp 97.9°F

## 2020-05-26 DIAGNOSIS — T25311D Burn of third degree of right ankle, subsequent encounter: Secondary | ICD-10-CM

## 2020-05-26 NOTE — Progress Notes (Signed)
   Subjective:     Patient ID: [name], male    DOB: [birth year], 74 y.o.   MRN: [id number]  Chief Complaint  Patient presents with  . Wound Check    HPI: The patient is a 74 y.o. male here for follow-up on his right ankle after placement of split-thickness skin graft on 01/19/2020 with Dr. Claudia Desanctis.  Patient here is here for evaluation of wound healing.  He is doing well, reports he has been applying sunscreen when exposed to the sun and lotion.  He is not having any fevers or chills.  No complaints.  Review of Systems  Constitutional: Negative.   Cardiovascular: Negative.   Skin: Negative.      Objective:   Vital Signs BP 126/67 (BP Location: Right Arm, Patient Position: Sitting, Cuff Size: Normal)   Pulse 84   Temp 97.9 F (36.6 C) (Oral)   SpO2 100%  Vital Signs and Nursing Note Reviewed  Physical Exam Constitutional:      Appearance: Normal appearance.  HENT:     Head: Normocephalic and atraumatic.  Musculoskeletal:       Feet:  Neurological:     Mental Status: He is alert.     Gait: Gait normal.  Psychiatric:        Mood and Affect: Mood normal.        Behavior: Behavior normal.       Assessment/Plan:     ICD-10-CM   1. Full thickness burn of right ankle, subsequent encounter  T25.311D      [name] is doing well  Recommend applying Vaseline daily for the next 1 to 2 weeks, continue to use sunscreen when exposed to sun or cover with bandage or sock.  Recommend calling with questions or concerns. Follow up as needed Pictures were obtained of the patient and placed in the chart with the patient's or guardian's permission.    Carola Rhine Mostafa Yuan, PA-C 05/26/2020, 10:30 AM

## 2020-05-27 ENCOUNTER — Ambulatory Visit: Payer: Medicare Other | Admitting: Podiatry

## 2020-05-27 DIAGNOSIS — L89892 Pressure ulcer of other site, stage 2: Secondary | ICD-10-CM

## 2020-05-27 DIAGNOSIS — E1142 Type 2 diabetes mellitus with diabetic polyneuropathy: Secondary | ICD-10-CM

## 2020-05-28 ENCOUNTER — Other Ambulatory Visit (INDEPENDENT_AMBULATORY_CARE_PROVIDER_SITE_OTHER): Payer: Self-pay | Admitting: Adult Health

## 2020-05-28 DIAGNOSIS — F418 Other specified anxiety disorders: Secondary | ICD-10-CM

## 2020-05-29 NOTE — Progress Notes (Signed)
Subjective: 74 year old male presents the office for concerns of a new wound on the left foot submetatarsal 1.  This is been ongoing about a month and is not getting any better.  Denies any drainage or pus.  No swelling.  He has been keeping a bandage on the wound daily.  Apply the same product that he was used on the right ankle burn.  He is going barefoot in the house and not wearing offloading devices.  Denies any systemic complaints such as fevers, chills, nausea, vomiting. No acute changes since last appointment, and no other complaints at this time.   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  Minimal hyperkeratotic tissue of the second toe distally without any underlying ulceration, drainage or signs of infection.  Wound present submetatarsal 1 with hyperkeratotic tissue with a granular wound base.  Periwound measurements measure 0.5 x 0.2 x 0.2 cm and after debridement was 0.8 x 0.4 x 0.3 cm.  Prior to debridement there is fibrotic tissue within the wound bed as well as hyperkeratotic/nonviable periwound.  There is no probing to bone, undermining or tunneling.  There is no surrounding erythema, ascending cellulitis No pain with calf compression, swelling, warmth, erythema  Assessment: Ulceration left foot  Plan: -All treatment options discussed with the patient including all alternatives, risks, complications.  -Today utilized a #094 with scalpel as well as a tissue nipper to sharply debride the wound on the left foot submetatarsal 1 without any complications with minimal blood loss.  Hopefully now that the wound is debrided nonviable tissue removal progress and start to heal better.  Pre and post wound measurements are above. -His surgical shoe at home, and to offload daily.  Do not go barefoot around the house. -Continue daily dressing changes with Prisma. -Monitor for any clinical  signs or symptoms of infection and directed to call the office immediately should any occur or go to the ER.  Return in about 2 weeks (around 06/10/2020).  [name]

## 2020-05-30 ENCOUNTER — Telehealth: Payer: Self-pay | Admitting: Family Medicine

## 2020-05-30 ENCOUNTER — Ambulatory Visit (INDEPENDENT_AMBULATORY_CARE_PROVIDER_SITE_OTHER): Payer: Medicare Other | Admitting: Family Medicine

## 2020-05-30 ENCOUNTER — Other Ambulatory Visit: Payer: Self-pay

## 2020-05-30 DIAGNOSIS — E11621 Type 2 diabetes mellitus with foot ulcer: Secondary | ICD-10-CM

## 2020-05-30 DIAGNOSIS — L97529 Non-pressure chronic ulcer of other part of left foot with unspecified severity: Secondary | ICD-10-CM | POA: Diagnosis not present

## 2020-05-30 DIAGNOSIS — H6691 Otitis media, unspecified, right ear: Secondary | ICD-10-CM | POA: Insufficient documentation

## 2020-05-30 DIAGNOSIS — F418 Other specified anxiety disorders: Secondary | ICD-10-CM

## 2020-05-30 DIAGNOSIS — E1149 Type 2 diabetes mellitus with other diabetic neurological complication: Secondary | ICD-10-CM | POA: Diagnosis not present

## 2020-05-30 DIAGNOSIS — L97509 Non-pressure chronic ulcer of other part of unspecified foot with unspecified severity: Secondary | ICD-10-CM | POA: Insufficient documentation

## 2020-05-30 DIAGNOSIS — I10 Essential (primary) hypertension: Secondary | ICD-10-CM

## 2020-05-30 DIAGNOSIS — H6090 Unspecified otitis externa, unspecified ear: Secondary | ICD-10-CM | POA: Insufficient documentation

## 2020-05-30 DIAGNOSIS — H6092 Unspecified otitis externa, left ear: Secondary | ICD-10-CM | POA: Diagnosis not present

## 2020-05-30 DIAGNOSIS — H6592 Unspecified nonsuppurative otitis media, left ear: Secondary | ICD-10-CM

## 2020-05-30 HISTORY — DX: Type 2 diabetes mellitus with foot ulcer: E11.621

## 2020-05-30 HISTORY — DX: Otitis media, unspecified, right ear: H66.91

## 2020-05-30 MED ORDER — ALPRAZOLAM 0.25 MG PO TABS
0.2500 mg | ORAL_TABLET | Freq: Two times a day (BID) | ORAL | 0 refills | Status: DC | PRN
Start: 2020-05-30 — End: 2021-01-22

## 2020-05-30 MED ORDER — SERTRALINE HCL 50 MG PO TABS: 50.0000 mg | ORAL_TABLET | Freq: Every day | ORAL | 3 refills | Status: DC

## 2020-05-30 MED ORDER — BUPROPION HCL ER (SR) 100 MG PO TB12: 100.0000 mg | ORAL_TABLET | Freq: Every day | ORAL | 0 refills | Status: DC

## 2020-05-30 MED ORDER — NEOMYCIN-POLYMYXIN-HC 3.5-10000-1 OT SOLN
3.0000 [drp] | Freq: Three times a day (TID) | OTIC | 0 refills | Status: DC
Start: 2020-05-30 — End: 2020-11-11

## 2020-05-30 MED ORDER — FLUTICASONE PROPIONATE 50 MCG/ACT NA SUSP
2.0000 | Freq: Every day | NASAL | 6 refills | Status: DC
Start: 2020-05-30 — End: 2020-11-23

## 2020-05-30 NOTE — Assessment & Plan Note (Signed)
Has been present about a month and he has been seen by podiatry who has debrided it and is guiding care. Encouraged to continue course. Report if worsens.

## 2020-05-30 NOTE — Assessment & Plan Note (Signed)
Has worsened since his right ankle burn and he notes low mood and very anxious moments especially when the grandkids are aroung. Started  Sertraline 50 mg tabs, 1/2 tab po daily x 7 days then increase to 50 mg daily. Given #5 Alprazolam to use sparingly for anxiety. reasesses at next visit or as needed. Warned regarding possible side effects

## 2020-05-30 NOTE — Assessment & Plan Note (Signed)
Is following with podiatry who is debriding and managing his wound. No pain or discharge. Has a follow up in next 2 weeks. Seek care if worsens. No going barefoot

## 2020-05-30 NOTE — Assessment & Plan Note (Signed)
Cortisporin otic qid

## 2020-05-30 NOTE — Assessment & Plan Note (Signed)
hgba1c acceptable, minimize simple carbs. Increase exercise as tolerated. Continue current meds 
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
## 2020-05-30 NOTE — Assessment & Plan Note (Addendum)
Well controlled, no changes to meds. Encouraged heart healthy diet such as the DASH diet and exercise as tolerated.  °

## 2020-05-30 NOTE — Assessment & Plan Note (Signed)
Add Flonase and nasal saline followed by valsalva maneuver and consider ENT evaluation if persists.
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
## 2020-05-30 NOTE — Patient Instructions (Signed)

## 2020-05-31 ENCOUNTER — Ambulatory Visit: Payer: Medicare Other | Admitting: Podiatry

## 2020-05-31 NOTE — Telephone Encounter (Signed)
Left message on machine that order faxed to [facility].  Order faxed to [facility] at [phone]

## 2020-06-07 DIAGNOSIS — M545 Low back pain: Secondary | ICD-10-CM | POA: Diagnosis not present

## 2020-06-09 ENCOUNTER — Encounter (INDEPENDENT_AMBULATORY_CARE_PROVIDER_SITE_OTHER): Payer: Self-pay | Admitting: Adult Health

## 2020-06-09 ENCOUNTER — Ambulatory Visit (INDEPENDENT_AMBULATORY_CARE_PROVIDER_SITE_OTHER): Payer: Medicare Other | Admitting: Adult Health

## 2020-06-09 ENCOUNTER — Other Ambulatory Visit: Payer: Self-pay

## 2020-06-09 VITALS — BP 137/84 | HR 65 | Temp 97.9°F | Ht 69.0 in | Wt 222.0 lb

## 2020-06-09 DIAGNOSIS — E669 Obesity, unspecified: Secondary | ICD-10-CM

## 2020-06-09 DIAGNOSIS — E785 Hyperlipidemia, unspecified: Secondary | ICD-10-CM

## 2020-06-09 DIAGNOSIS — E1169 Type 2 diabetes mellitus with other specified complication: Secondary | ICD-10-CM | POA: Diagnosis not present

## 2020-06-09 DIAGNOSIS — E118 Type 2 diabetes mellitus with unspecified complications: Secondary | ICD-10-CM | POA: Diagnosis not present

## 2020-06-09 DIAGNOSIS — E1159 Type 2 diabetes mellitus with other circulatory complications: Secondary | ICD-10-CM | POA: Diagnosis not present

## 2020-06-09 DIAGNOSIS — Z6832 Body mass index (BMI) 32.0-32.9, adult: Secondary | ICD-10-CM

## 2020-06-09 DIAGNOSIS — F3289 Other specified depressive episodes: Secondary | ICD-10-CM

## 2020-06-09 DIAGNOSIS — F418 Other specified anxiety disorders: Secondary | ICD-10-CM

## 2020-06-09 DIAGNOSIS — I152 Hypertension secondary to endocrine disorders: Secondary | ICD-10-CM

## 2020-06-09 DIAGNOSIS — I10 Essential (primary) hypertension: Secondary | ICD-10-CM

## 2020-06-09 MED ORDER — BUPROPION HCL ER (SR) 100 MG PO TB12: 100.0000 mg | ORAL_TABLET | Freq: Every day | ORAL | 0 refills | Status: DC

## 2020-06-13 DIAGNOSIS — E1169 Type 2 diabetes mellitus with other specified complication: Secondary | ICD-10-CM

## 2020-06-13 DIAGNOSIS — E785 Hyperlipidemia, unspecified: Secondary | ICD-10-CM | POA: Insufficient documentation

## 2020-06-13 HISTORY — DX: Type 2 diabetes mellitus with other specified complication: E11.69

## 2020-06-13 NOTE — Progress Notes (Signed)
Chief Complaint:   OBESITY [name] is here to discuss his progress with his obesity treatment plan along with follow-up of his obesity related diagnoses. [name] is on the Category 3 Plan/Low Carb Plan and states he is following his eating plan approximately 60% of the time. [name] states he is exercising 0 minutes 0 times per week.  Today's visit was #: 40 Starting weight: 236 lbs Starting date: 08/25/2018 Today's weight: 222 lbs Today's date: 06/09/2020 Total lbs lost to date: 14 Total lbs lost since last in-office visit: 0  Interim History: [name] recently traveled to [place] with his wife for vacation and deviated from the plan while out-of-state. He reports a slight decrease in normal anxiety levels since his PCP started him on low dose SSRI. He prefers the structure of a category plan versus a low carbohydrate plan.  Subjective:   Type 2 diabetes mellitus with complications ([place]). [name] has a small increase in his A1c, however, it is still at goal. He is on glimepiride 2 mg daily and metformin 1000 mg daily. Labs were discussed with the patient today.    Lab Results  Component Value Date   HGBA1C 6.5 (H) 05/23/2020   HGBA1C 6.4 02/23/2020   HGBA1C 6.5 (H) 12/06/2019   Lab Results  Component Value Date   MICROALBUR 0.6 12/12/2012   LDLCALC 84 05/23/2020   CREATININE 0.93 05/23/2020   Lab Results  Component Value Date   INSULIN 16.7 05/23/2020   INSULIN 15.3 12/15/2018   INSULIN 29.0 (H) 08/25/2018   Hypertension associated with diabetes ([place]). Blood pressure is at goal on today's office visit. CMP is stable. Tsuneo is on multiple anti-hypertensive medications.   Labs were discussed with the patient today.     BP Readings from Last 3 Encounters:  06/09/20 137/84  05/30/20 118/68  05/26/20 126/67   Lab Results  Component Value Date   CREATININE 0.93 05/23/2020   CREATININE 1.00 02/23/2020   CREATININE 0.76 01/19/2020   Depression with anxiety. [name]  states symptoms worsened with right ankle burn and exacerbated when grandchildren are around. His PCP started him on sertraline, has yet to titrate to full dose of 50mg  QD. and his PCP also provided him with a prescription for alprazolam PRN.  Other depression,with emotional eating. [name] is struggling with emotional eating and using food for comfort to the extent that it is negatively impacting his health. He has been working on behavior modification techniques to help reduce his emotional eating and has been somewhat successful. He shows no sign of suicidal or homicidal ideations. [name] reports a reduction in his cravings of sugar/high salt snacks. Blood pressure is at goal today.  Hyperlipidemia associated with type 2 diabetes mellitus ([place]). LDL is slightly above goal at 84. Henry is on atorvastatin 80 mg daily. He reports mild myalgias. Labs were discussed with the patient today.     Lab Results  Component Value Date   CHOL 144 05/23/2020   HDL 34 (L) 05/23/2020   LDLCALC 84 05/23/2020   LDLDIRECT 104.9 12/12/2012   TRIG 147 05/23/2020   CHOLHDL 4 02/23/2020   Lab Results  Component Value Date   ALT 18 05/23/2020   AST 15 05/23/2020   ALKPHOS 117 05/23/2020   BILITOT 0.3 05/23/2020   The 10-year ASCVD risk score [name] DC Jr., et al., 2013) is: 45%   Values used to calculate the score:     Age: 39 years     Sex: Male  Is Non-Hispanic African American: No     Diabetic: Yes     Tobacco smoker: No     Systolic Blood Pressure: 672 mmHg     Is BP treated: Yes     HDL Cholesterol: 34 mg/dL     Total Cholesterol: 144 mg/dL  Assessment/Plan:   Type 2 diabetes mellitus with complications ([place]). Good blood sugar control is important to decrease the likelihood of diabetic complications such as nephropathy, neuropathy, limb loss, blindness, coronary artery disease, and death. Intensive lifestyle modification including diet, exercise and weight loss are the first line of treatment for  diabetes. [name] will continue his current medication regimen as directed.   Hypertension associated with diabetes ([place]). Tc is working on healthy weight loss and exercise to improve blood pressure control. We will watch for signs of hypotension as he continues his lifestyle modifications. Harrell will continue his current medication regimen.  Depression with anxiety.   Other depression,with emotional eating. Behavior modification techniques were discussed today to help [name] deal with his emotional/non-hunger eating behaviors.  Orders and follow up as documented in patient record. Refill was given for buPROPion (WELLBUTRIN SR) 100 MG 12 hr tablet QAM #30 with 0 refills.  Hyperlipidemia associated with type 2 diabetes mellitus ([place]). Cardiovascular risk and specific lipid/LDL goals reviewed.  We discussed several lifestyle modifications today and [name] will continue to work on diet, exercise and weight loss efforts. Orders and follow up as documented in patient record. [name] was instructed to continue his current statin therapy as directed and to decrease saturated fats in his diet.  Counseling Intensive lifestyle modifications are the first line treatment for this issue. . Dietary changes: Increase soluble fiber. Decrease simple carbohydrates. . Exercise changes: Moderate to vigorous-intensity aerobic activity 150 minutes per week if tolerated. . Lipid-lowering medications: see documented in medical record.  Class 1 obesity with serious comorbidity and body mass index (BMI) of 32.0 to 32.9 in adult, unspecified obesity type.  [name] is currently in the action stage of change. As such, his goal is to continue with weight loss efforts. He has agreed to the Category 3 Plan.   Exercise goals: No exercise has been prescribed at this time.  Behavioral modification strategies: increasing lean protein intake, decreasing simple carbohydrates, increasing water intake, decreasing sodium intake, meal  planning and cooking strategies and planning for success.  [name] has agreed to follow-up with our clinic in 2 weeks. He was informed of the importance of frequent follow-up visits to maximize his success with intensive lifestyle modifications for his multiple health conditions.   Objective:   Blood pressure 137/84, pulse 65, temperature 97.9 F (36.6 C), temperature source Oral, height 5\' 9"  (1.753 m), weight 222 lb (100.7 kg), SpO2 93 %. Body mass index is 32.78 kg/m.  General: Cooperative, alert, well developed, in no acute distress. HEENT: Conjunctivae and lids unremarkable. Cardiovascular: Regular rhythm.  Lungs: Normal work of breathing. Neurologic: No focal deficits.   Lab Results  Component Value Date   CREATININE 0.93 05/23/2020   BUN 20 05/23/2020   NA 140 05/23/2020   K 4.9 05/23/2020   CL 102 05/23/2020   CO2 26 05/23/2020   Lab Results  Component Value Date   ALT 18 05/23/2020   AST 15 05/23/2020   ALKPHOS 117 05/23/2020   BILITOT 0.3 05/23/2020   Lab Results  Component Value Date   HGBA1C 6.5 (H) 05/23/2020   HGBA1C 6.4 02/23/2020   HGBA1C 6.5 (H) 12/06/2019   HGBA1C 5.6 07/01/2019  HGBA1C 5.9 (H) 04/24/2019   Lab Results  Component Value Date   INSULIN 16.7 05/23/2020   INSULIN 15.3 12/15/2018   INSULIN 29.0 (H) 08/25/2018   Lab Results  Component Value Date   TSH 3.19 02/23/2020   Lab Results  Component Value Date   CHOL 144 05/23/2020   HDL 34 (L) 05/23/2020   LDLCALC 84 05/23/2020   LDLDIRECT 104.9 12/12/2012   TRIG 147 05/23/2020   CHOLHDL 4 02/23/2020   Lab Results  Component Value Date   WBC 7.1 02/23/2020   HGB 13.0 02/23/2020   HCT 39.7 02/23/2020   MCV 94.4 02/23/2020   PLT 211.0 02/23/2020   Lab Results  Component Value Date   FERRITIN 311 12/04/2019   Obesity Behavioral Intervention Documentation for Insurance:   Approximately 15 minutes were spent on the discussion below.  ASK: We discussed the diagnosis of  obesity with [name] today and Binyomin agreed to give [name] permission to discuss obesity behavioral modification therapy today.  ASSESS: Demyan has the diagnosis of obesity and his BMI today is 32.8. Xiong is in the action stage of change.   ADVISE: Leonard was educated on the multiple health risks of obesity as well as the benefit of weight loss to improve his health. He was advised of the need for long term treatment and the importance of lifestyle modifications to improve his current health and to decrease his risk of future health problems.  AGREE: Multiple dietary modification options and treatment options were discussed and Darran agreed to follow the recommendations documented in the above note.  ARRANGE: Teyon was educated on the importance of frequent visits to treat obesity as outlined per CMS and USPSTF guidelines and agreed to schedule his next follow up appointment today.  Attestation Statements:   Reviewed by clinician on day of visit: allergies, medications, problem list, medical history, surgical history, family history, social history, and previous encounter notes.  I, Michaelene Song, am acting as Location manager for [employer], NP-C   I have reviewed the above documentation for accuracy and completeness, and I agree with the above. -  Esaw Grandchild, NP

## 2020-06-14 ENCOUNTER — Ambulatory Visit: Payer: Medicare Other | Admitting: Podiatry

## 2020-06-22 ENCOUNTER — Encounter (INDEPENDENT_AMBULATORY_CARE_PROVIDER_SITE_OTHER): Payer: Self-pay | Admitting: Adult Health

## 2020-06-22 ENCOUNTER — Other Ambulatory Visit: Payer: Self-pay | Admitting: Family Medicine

## 2020-06-22 ENCOUNTER — Other Ambulatory Visit: Payer: Self-pay

## 2020-06-22 ENCOUNTER — Ambulatory Visit (INDEPENDENT_AMBULATORY_CARE_PROVIDER_SITE_OTHER): Payer: Medicare Other | Admitting: Adult Health

## 2020-06-22 VITALS — BP 113/73 | HR 72 | Temp 97.7°F | Ht 69.0 in | Wt 216.0 lb

## 2020-06-22 DIAGNOSIS — E118 Type 2 diabetes mellitus with unspecified complications: Secondary | ICD-10-CM | POA: Diagnosis not present

## 2020-06-22 DIAGNOSIS — E1159 Type 2 diabetes mellitus with other circulatory complications: Secondary | ICD-10-CM

## 2020-06-22 DIAGNOSIS — Z6832 Body mass index (BMI) 32.0-32.9, adult: Secondary | ICD-10-CM

## 2020-06-22 DIAGNOSIS — E669 Obesity, unspecified: Secondary | ICD-10-CM

## 2020-06-22 DIAGNOSIS — F3289 Other specified depressive episodes: Secondary | ICD-10-CM

## 2020-06-22 DIAGNOSIS — I152 Hypertension secondary to endocrine disorders: Secondary | ICD-10-CM

## 2020-06-22 DIAGNOSIS — I10 Essential (primary) hypertension: Secondary | ICD-10-CM

## 2020-06-22 MED ORDER — BUPROPION HCL ER (SR) 100 MG PO TB12: 100.0000 mg | ORAL_TABLET | Freq: Every day | ORAL | 0 refills | Status: DC

## 2020-06-23 NOTE — Progress Notes (Signed)
Chief Complaint:   OBESITY [name] is here to discuss his progress with his obesity treatment plan along with follow-up of his obesity related diagnoses. [name] is on the Category 3 Plan and states he is following his eating plan approximately 70% of the time. [name] states he is exercising 0 minutes 0 times per week.  Today's visit was #: 50 Starting weight: 236 lbs Starting date: 08/25/2018 Today's weight: 216 lbs Today's date: 06/22/2020 Total lbs lost to date: 20 Total lbs lost since last in-office visit: 6  Interim History: [name] and his wife traveled to [place] for 3 days and he really enjoyed his vacation relaxing with friends and golfing frequently. He stayed on track with focusing on protein and vegetables at meals while on vacation. His ultimate goal is to lose down to 185 lbs.  Subjective:   Type 2 diabetes mellitus with complications ([name]) without longterm insulin use. [name] is on metformin and Amaryl. He reports ambulatory fasting blood glucose levels in the range of 110 to 120's.  Lab Results  Component Value Date   HGBA1C 6.5 (H) 05/23/2020   HGBA1C 6.4 02/23/2020   HGBA1C 6.5 (H) 12/06/2019   Lab Results  Component Value Date   MICROALBUR 0.6 12/12/2012   LDLCALC 84 05/23/2020   CREATININE 0.93 05/23/2020   Lab Results  Component Value Date   INSULIN 16.7 05/23/2020   INSULIN 15.3 12/15/2018   INSULIN 29.0 (H) 08/25/2018    Other depression,with emotional eating. [name] is struggling with emotional eating and using food for comfort to the extent that it is negatively impacting his health. He has been working on behavior modification techniques to help reduce his emotional eating and has been somewhat successful. He shows no sign of suicidal or homicidal ideations. [name] reports continued resolution of cravings since restarting low dose bupropion. Blood pressure is excellent at office visit today (113/73).  Hypertension associated with type 2 diabetes  mellitus ([name]). Blood pressure is at goal at today's office visit. [name] is on amlodipine 5 mg daily, carvedilol 25 mg 1/2 tab daily, and losartan 50 mg daily.  BP Readings from Last 3 Encounters:  06/22/20 113/73  06/09/20 137/84  05/30/20 118/68   Lab Results  Component Value Date   CREATININE 0.93 05/23/2020   CREATININE 1.00 02/23/2020   CREATININE 0.76 01/19/2020   Assessment/Plan:   Type 2 diabetes mellitus with complications ([name]) without longterm insulin use. Good blood sugar control is important to decrease the likelihood of diabetic complications such as nephropathy, neuropathy, limb loss, blindness, coronary artery disease, and death. Intensive lifestyle modification including diet, exercise and weight loss are the first line of treatment for diabetes. [name] will continue his current anti-diabetic regimen and continue to follow the Category 3 meal plan.  Other depression,with emotional eating. Behavior modification techniques were discussed today to help [name] deal with his emotional/non-hunger eating behaviors.  Orders and follow up as documented in patient record. Refill was given for buPROPion (WELLBUTRIN SR) 100 MG 12 hr tablet daily #30 with 0 refills.  Hypertension associated with type 2 diabetes mellitus ([name]). [name] is working on healthy weight loss and exercise to improve blood pressure control. We will watch for signs of hypotension as he continues his lifestyle modifications. [name] will continue his current anti-hypertensives and continue to follow the Category 3 meal plan.  Class 1 obesity with serious comorbidity and body mass index (BMI) of 32.0 to 32.9 in adult, unspecified obesity type.  [name] is currently  in the action stage of change. As such, his goal is to continue with weight loss efforts. He has agreed to the Category 3 Plan.   Exercise goals: [name] will continue his current exercise regimen (golfing).   Behavioral modification strategies: increasing lean  protein intake, meal planning and cooking strategies, travel eating strategies and planning for success.  [name] has agreed to follow-up with our clinic in 2 weeks. He was informed of the importance of frequent follow-up visits to maximize his success with intensive lifestyle modifications for his multiple health conditions.   Objective:   Blood pressure 113/73, pulse 72, temperature 97.7 F (36.5 C), temperature source Oral, height 5\' 9"  (1.753 m), weight 216 lb (98 kg), SpO2 94 %. Body mass index is 31.9 kg/m.  General: Cooperative, alert, well developed, in no acute distress. HEENT: Conjunctivae and lids unremarkable. Cardiovascular: Regular rhythm.  Lungs: Normal work of breathing. Neurologic: No focal deficits.   Lab Results  Component Value Date   CREATININE 0.93 05/23/2020   BUN 20 05/23/2020   NA 140 05/23/2020   K 4.9 05/23/2020   CL 102 05/23/2020   CO2 26 05/23/2020   Lab Results  Component Value Date   ALT 18 05/23/2020   AST 15 05/23/2020   ALKPHOS 117 05/23/2020   BILITOT 0.3 05/23/2020   Lab Results  Component Value Date   HGBA1C 6.5 (H) 05/23/2020   HGBA1C 6.4 02/23/2020   HGBA1C 6.5 (H) 12/06/2019   HGBA1C 5.6 07/01/2019   HGBA1C 5.9 (H) 04/24/2019   Lab Results  Component Value Date   INSULIN 16.7 05/23/2020   INSULIN 15.3 12/15/2018   INSULIN 29.0 (H) 08/25/2018   Lab Results  Component Value Date   TSH 3.19 02/23/2020   Lab Results  Component Value Date   CHOL 144 05/23/2020   HDL 34 (L) 05/23/2020   LDLCALC 84 05/23/2020   LDLDIRECT 104.9 12/12/2012   TRIG 147 05/23/2020   CHOLHDL 4 02/23/2020   Lab Results  Component Value Date   WBC 7.1 02/23/2020   HGB 13.0 02/23/2020   HCT 39.7 02/23/2020   MCV 94.4 02/23/2020   PLT 211.0 02/23/2020   Lab Results  Component Value Date   FERRITIN 311 12/04/2019   Obesity Behavioral Intervention Documentation for Insurance:   Approximately 15 minutes were spent on the discussion  below.  ASK: We discussed the diagnosis of obesity with [name] today and [name] agreed to give [name] permission to discuss obesity behavioral modification therapy today.  ASSESS: [name] has the diagnosis of obesity and his BMI today is 32.0. [name] is in the action stage of change.   ADVISE: [name] was educated on the multiple health risks of obesity as well as the benefit of weight loss to improve his health. He was advised of the need for long term treatment and the importance of lifestyle modifications to improve his current health and to decrease his risk of future health problems.  AGREE: Multiple dietary modification options and treatment options were discussed and [name] agreed to follow the recommendations documented in the above note.  ARRANGE: [name] was educated on the importance of frequent visits to treat obesity as outlined per CMS and USPSTF guidelines and agreed to schedule his next follow up appointment today.  Attestation Statements:   Reviewed by clinician on day of visit: allergies, medications, problem list, medical history, surgical history, family history, social history, and previous encounter notes.  IMichaelene Song, am acting as Location manager for [employer], NP-C  I have reviewed the above documentation for accuracy and completeness, and I agree with the above. -  Esaw Grandchild, NP

## 2020-06-26 ENCOUNTER — Encounter: Payer: Self-pay | Admitting: Family Medicine

## 2020-06-26 ENCOUNTER — Other Ambulatory Visit (INDEPENDENT_AMBULATORY_CARE_PROVIDER_SITE_OTHER): Payer: Self-pay | Admitting: Adult Health

## 2020-06-26 DIAGNOSIS — F3289 Other specified depressive episodes: Secondary | ICD-10-CM

## 2020-06-27 ENCOUNTER — Other Ambulatory Visit: Payer: Self-pay | Admitting: Family Medicine

## 2020-06-27 MED ORDER — PREGABALIN 150 MG PO CAPS: 150.0000 mg | ORAL_CAPSULE | Freq: Two times a day (BID) | ORAL | 1 refills | Status: DC

## 2020-06-28 ENCOUNTER — Encounter (INDEPENDENT_AMBULATORY_CARE_PROVIDER_SITE_OTHER): Payer: Self-pay

## 2020-06-30 ENCOUNTER — Ambulatory Visit (INDEPENDENT_AMBULATORY_CARE_PROVIDER_SITE_OTHER): Payer: Medicare Other | Admitting: Podiatry

## 2020-06-30 ENCOUNTER — Other Ambulatory Visit: Payer: Self-pay

## 2020-06-30 DIAGNOSIS — L97522 Non-pressure chronic ulcer of other part of left foot with fat layer exposed: Secondary | ICD-10-CM

## 2020-06-30 DIAGNOSIS — E1142 Type 2 diabetes mellitus with diabetic polyneuropathy: Secondary | ICD-10-CM | POA: Diagnosis not present

## 2020-06-30 MED ORDER — AMOXICILLIN-POT CLAVULANATE 875-125 MG PO TABS: 1.0000 | ORAL_TABLET | Freq: Two times a day (BID) | ORAL | 0 refills | Status: DC

## 2020-07-02 NOTE — Progress Notes (Signed)
Subjective: 74 year old male presents the office for follow-up evaluation of a wound on the left foot submetatarsal 1.  He states that the wound does not heal in the way he would like for it to heal.  Denies any drainage or pus any swelling or redness.  He has been working more recently but he is interested at work. He recently was at the [employer]. His wife has been changing the bandage with iodosorb. Denies any systemic complaints such as fevers, chills, nausea, vomiting. No acute changes since last appointment, and no other complaints at this time.   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  Ulceration submetatarsal 1 with hyperkeratotic periwound.  Upon debridement, this is growing in the wound measures 1 x 1 x 0.3 cm that is larger compared to last appointment.  There is no probing to bone, undermining, there is no surrounding erythema, ascending cellulitis.  There is no fluctuation capitation.  There is no malodor. No pain with calf compression, swelling, warmth, erythema  Assessment: Ulceration left foot  Plan: -All treatment options discussed with the patient including all alternatives, risks, complications.  -I sharply debrided the ulceration with a #312 blade scalpel without any complication including none healthy, bleeding, viable tissue. -Will switch to prisma dressing changes daily -Offloading- modified surgical shoe -Discussed possible surgical intervention -Monitor for any clinical signs or symptoms of infection and directed to call the office immediately should any occur or go to the ER.  *X-ray next appointment   Return in about 2 weeks (around 07/14/2020).  [name]

## 2020-07-07 ENCOUNTER — Ambulatory Visit (INDEPENDENT_AMBULATORY_CARE_PROVIDER_SITE_OTHER): Payer: Medicare Other | Admitting: Adult Health

## 2020-07-07 ENCOUNTER — Other Ambulatory Visit: Payer: Self-pay

## 2020-07-07 ENCOUNTER — Encounter (INDEPENDENT_AMBULATORY_CARE_PROVIDER_SITE_OTHER): Payer: Self-pay | Admitting: Adult Health

## 2020-07-07 VITALS — BP 117/64 | HR 66 | Temp 98.0°F | Ht 69.0 in | Wt 220.0 lb

## 2020-07-07 DIAGNOSIS — E669 Obesity, unspecified: Secondary | ICD-10-CM

## 2020-07-07 DIAGNOSIS — L97529 Non-pressure chronic ulcer of other part of left foot with unspecified severity: Secondary | ICD-10-CM

## 2020-07-07 DIAGNOSIS — K921 Melena: Secondary | ICD-10-CM | POA: Diagnosis not present

## 2020-07-07 DIAGNOSIS — I152 Hypertension secondary to endocrine disorders: Secondary | ICD-10-CM

## 2020-07-07 DIAGNOSIS — E1159 Type 2 diabetes mellitus with other circulatory complications: Secondary | ICD-10-CM

## 2020-07-07 DIAGNOSIS — E118 Type 2 diabetes mellitus with unspecified complications: Secondary | ICD-10-CM | POA: Diagnosis not present

## 2020-07-07 DIAGNOSIS — I10 Essential (primary) hypertension: Secondary | ICD-10-CM

## 2020-07-07 DIAGNOSIS — F3289 Other specified depressive episodes: Secondary | ICD-10-CM

## 2020-07-07 DIAGNOSIS — Z6832 Body mass index (BMI) 32.0-32.9, adult: Secondary | ICD-10-CM

## 2020-07-07 NOTE — Progress Notes (Signed)
Chief Complaint:   [name] is here to discuss his progress with his Donald treatment plan along with follow-up of his Donald related diagnoses. [name] is on the Category 3 Plan and states he is following his eating plan approximately 70-75% of the time. [name] states he is exercising 0 minutes 0 times per week.  Today's visit was #: 37 Starting weight: 236 lbs Starting date: 08/25/2018 Today's weight: 220 lbs Today's date: 07/07/2020 Total lbs lost to date: 16 Total lbs lost since last in-office visit: 0  Interim History: [name] recently underwent wound debridement of a left foot submetarsal wound. He hasn't been as active due to healing and wearing a surgical shoe. He reports GI upset since starting oral antibiotics.  Subjective:   Type 2 diabetes mellitus with complications ([place]) without longterm insulin use. [name] is on metformin 1,000 mg daily and glimepiride 2 mg BID. A1c is 6.5, not at goal. Ambulatory fasting blood glucose levels are in the range of 110 and 130's.  Lab Results  Component Value Date   HGBA1C 6.5 (H) 05/23/2020   HGBA1C 6.4 02/23/2020   HGBA1C 6.5 (H) 12/06/2019   Lab Results  Component Value Date   MICROALBUR 0.6 12/12/2012   LDLCALC 84 05/23/2020   CREATININE 0.93 05/23/2020   Lab Results  Component Value Date   INSULIN 16.7 05/23/2020   INSULIN 15.3 12/15/2018   INSULIN 29.0 (H) 08/25/2018   Hypertension associated with type 2 diabetes mellitus ([place]). Blood pressure is excellent at 117/64 with a heart rate of 66. [name] is on amlodipine 5 mg daily, carvedilol 25 mg 1/2 tablet BID, and losartan 50 mg daily.  BP Readings from Last 3 Encounters:  07/07/20 117/64  06/22/20 113/73  06/09/20 137/84   Lab Results  Component Value Date   CREATININE 0.93 05/23/2020   CREATININE 1.00 02/23/2020   CREATININE 0.76 01/19/2020   Other depression,with emotional eating. [name] is struggling with emotional eating and using food for comfort to  the extent that it is negatively impacting his health. He has been working on behavior modification techniques to help reduce his emotional eating and has been somewhat successful. He shows no sign of suicidal or homicidal ideations. [name] is on bupropion SR 100 mg and sertraline 50 mg daily. He denies excessive cravings and reports a stable mood.  Black tarry stools. [name] was started on Augmentin 875/125 mg BID on 06/30/2020. He reports having darkened stools almost immediately. He denies abdominal pain and denies personal or family history of colon cancer. He reports his last colonoscopy was normal.  Ulcer of left foot, with unspecified severity ([place]). [name] performed wound I&D. [name] is on oral antibiotics and is wearing a surgical shoe. Blood glucose is well controlled.  Assessment/Plan:   Type 2 diabetes mellitus with complications ([place]) without longterm insulin use. Good blood sugar control is important to decrease the likelihood of diabetic complications such as nephropathy, neuropathy, limb loss, blindness, coronary artery disease, and death. Intensive lifestyle modification including diet, exercise and weight loss are the first line of treatment for diabetes. [name] will continue his current anti-diabetic regimen and will continue to follow the Category 3 meal plan.  Hypertension associated with type 2 diabetes mellitus ([place]). [name] is working on healthy weight loss and exercise to improve blood pressure control. We will watch for signs of hypotension as he continues his lifestyle modifications. He will continue his current anti-hypertensive medications and will continue to follow the Category 3 meal  plan.  Other depression,with emotional eating. Behavior modification techniques were discussed today to help [name] deal with his emotional/non-hunger eating behaviors.  Orders and follow up as documented in patient record. [name] will continue his current medication regimen. He does not need a  refill of his bupropion today.  Black tarry stools. If abdominal pain develops, or if black tarry stools persist 2 days after completing antibiotics, [name] will contact his PCP.  Ulcer of left foot, with unspecified severity ([place]). [name] will follow-up with Podiatry as directed.   Class 1 Donald with serious comorbidity and body mass index (BMI) of 32.0 to 32.9 in adult, unspecified Donald type.  [name] is currently in the action stage of change. As such, his goal is to continue with weight loss efforts. He has agreed to the Category 3 Plan.   Exercise goals: No exercise has been prescribed at this time. [name] is recovering from left foot submetatarsal wound debridement.   Behavioral modification strategies: increasing lean protein intake, meal planning and cooking strategies and planning for success.  [name] has agreed to follow-up with our clinic in 2 weeks. He was informed of the importance of frequent follow-up visits to maximize his success with intensive lifestyle modifications for his multiple health conditions.   Objective:   Blood pressure 117/64, pulse 66, temperature 98 F (36.7 C), temperature source Oral, height 5\' 9"  (1.753 m), weight 220 lb (99.8 kg), SpO2 94 %. Body mass index is 32.49 kg/m.  General: Cooperative, alert, well developed, in no acute distress. HEENT: Conjunctivae and lids unremarkable. Cardiovascular: Regular rhythm.  Lungs: Normal work of breathing. Neurologic: No focal deficits.   Lab Results  Component Value Date   CREATININE 0.93 05/23/2020   BUN 20 05/23/2020   NA 140 05/23/2020   K 4.9 05/23/2020   CL 102 05/23/2020   CO2 26 05/23/2020   Lab Results  Component Value Date   ALT 18 05/23/2020   AST 15 05/23/2020   ALKPHOS 117 05/23/2020   BILITOT 0.3 05/23/2020   Lab Results  Component Value Date   HGBA1C 6.5 (H) 05/23/2020   HGBA1C 6.4 02/23/2020   HGBA1C 6.5 (H) 12/06/2019   HGBA1C 5.6 07/01/2019   HGBA1C 5.9 (H) 04/24/2019    Lab Results  Component Value Date   INSULIN 16.7 05/23/2020   INSULIN 15.3 12/15/2018   INSULIN 29.0 (H) 08/25/2018   Lab Results  Component Value Date   TSH 3.19 02/23/2020   Lab Results  Component Value Date   CHOL 144 05/23/2020   HDL 34 (L) 05/23/2020   LDLCALC 84 05/23/2020   LDLDIRECT 104.9 12/12/2012   TRIG 147 05/23/2020   CHOLHDL 4 02/23/2020   Lab Results  Component Value Date   WBC 7.1 02/23/2020   HGB 13.0 02/23/2020   HCT 39.7 02/23/2020   MCV 94.4 02/23/2020   PLT 211.0 02/23/2020   Lab Results  Component Value Date   FERRITIN 311 12/04/2019   Attestation Statements:   Reviewed by clinician on day of visit: allergies, medications, problem list, medical history, surgical history, family history, social history, and previous encounter notes.  Time spent on visit including pre-visit chart review and post-visit charting and care was 31 minutes.   I, Michaelene Song, am acting as Location manager for [employer], NP-C   I have reviewed the above documentation for accuracy and completeness, and I agree with the above. -  Esaw Grandchild, NP

## 2020-07-14 ENCOUNTER — Other Ambulatory Visit: Payer: Self-pay

## 2020-07-14 ENCOUNTER — Ambulatory Visit (INDEPENDENT_AMBULATORY_CARE_PROVIDER_SITE_OTHER): Payer: Medicare Other

## 2020-07-14 ENCOUNTER — Ambulatory Visit (INDEPENDENT_AMBULATORY_CARE_PROVIDER_SITE_OTHER): Payer: Medicare Other | Admitting: Podiatry

## 2020-07-14 DIAGNOSIS — L97522 Non-pressure chronic ulcer of other part of left foot with fat layer exposed: Secondary | ICD-10-CM

## 2020-07-21 ENCOUNTER — Encounter (INDEPENDENT_AMBULATORY_CARE_PROVIDER_SITE_OTHER): Payer: Self-pay | Admitting: Adult Health

## 2020-07-21 ENCOUNTER — Ambulatory Visit (INDEPENDENT_AMBULATORY_CARE_PROVIDER_SITE_OTHER): Payer: Medicare Other | Admitting: Adult Health

## 2020-07-21 ENCOUNTER — Other Ambulatory Visit: Payer: Self-pay

## 2020-07-21 VITALS — BP 128/53 | HR 60 | Temp 97.9°F | Ht 69.0 in | Wt 220.0 lb

## 2020-07-21 DIAGNOSIS — E669 Obesity, unspecified: Secondary | ICD-10-CM

## 2020-07-21 DIAGNOSIS — F3289 Other specified depressive episodes: Secondary | ICD-10-CM

## 2020-07-21 DIAGNOSIS — Z6832 Body mass index (BMI) 32.0-32.9, adult: Secondary | ICD-10-CM | POA: Diagnosis not present

## 2020-07-21 DIAGNOSIS — K921 Melena: Secondary | ICD-10-CM

## 2020-07-21 MED ORDER — BUPROPION HCL ER (SR) 100 MG PO TB12: 100.0000 mg | ORAL_TABLET | Freq: Every day | ORAL | 0 refills | Status: DC

## 2020-07-24 NOTE — Progress Notes (Signed)
Subjective: 74 year old male presents the office for follow-up evaluation of a wound on the left foot submetatarsal 1.  They have been applying Prisma to the wound daily.  He is not sure how the wound is doing but he would like for the wound to be healed.  Denies any drainage or pus or any swelling or redness to his foot.  He recently started a new job as well please try to stop the foot otherwise.  Currently denies any fevers, chills, nausea, vomiting.  No calf pain, chest pain, shortness of breath.   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  Ulceration submetatarsal 1 with hyperkeratotic periwound.  Upon debridement, this is growing in the wound measures 0.8 x 0.5 x 0.3 cm and there is no probing, undermining or tunneling.  There is no surrounding erythema, ascending cellulitis but there is no drainage or pus or any fluctuation or crepitation.  There is no malodor. No pain with calf compression, swelling, warmth, erythema  Assessment: Ulceration left foot  Plan: -All treatment options discussed with the patient including all alternatives, risks, complications.  -X-rays obtained reviewed.  No significant changes and there is no evidence of acute fracture, osteomyelitis or soft tissue emphysema. -Today I sharply debrided the ulceration with a #312 blade scalpel without any complication including none healthy, bleeding, viable tissue. -Continue with prisma dressing changes  -Offloading- modified surgical shoe -Discussed possible surgical intervention -Monitor for any clinical signs or symptoms of infection and directed to call the office immediately should any occur or go to the ER.  Return in about 2 weeks (around 07/28/2020).  [name]

## 2020-07-26 NOTE — Progress Notes (Signed)
Chief Complaint:   OBESITY [name] is here to discuss his progress with his obesity treatment plan along with follow-up of his obesity related diagnoses. Khaden is on the Category 3 Plan and states he is following his eating plan approximately 70% of the time. Omare states he is exercising 0 minutes 0 times per week.  Today's visit was #: 26 Starting weight: 236 lbs Starting date: 08/25/2018 Today's weight: 220 lbs Today's date: 07/21/2020 Total lbs lost to date: 16 Total lbs lost since last in-office visit: 0  Interim History: Areon has eliminated soda and sugar. He has not been closely tracking his snack calories per day. He reports complete resolution of black stools and has completed a course of antibiotics. He will follow-up with Podiatry 08/02/2020 regarding his left foot wound.  Subjective:   Black tarry stools. Adonijah has completed a course of Augmentin and black stools have resolved for greater than 1.5 weeks. He denies abdominal pain.  Other depression,with emotional eating. Aedon is struggling with emotional eating and using food for comfort to the extent that it is negatively impacting his health. He has been working on behavior modification techniques to help reduce his emotional eating and has been somewhat successful. He shows no sign of suicidal or homicidal ideations. Ramie reports excellent control of cravings since restarting bupropion. Blood pressure and heart rate are excellent today.  Assessment/Plan:   Black tarry stools. Will continue to monitor. Zayd will continue to follow the Category 3 meal plan.  Other depression,with emotional eating. Behavior modification techniques were discussed today to help Chou deal with his emotional/non-hunger eating behaviors.  Orders and follow up as documented in patient record. Refill was given for buPROPion (WELLBUTRIN SR) 100 MG 12 hr tablet daily #30 with 0 refills.  Class 1 obesity with serious comorbidity and body  mass index (BMI) of 32.0 to 32.9 in adult, unspecified obesity type.  Rasheem is currently in the action stage of change. As such, his goal is to continue with weight loss efforts. He has agreed to the Category 3 Plan. He will keep running calorie intake of snack calories per day.  Exercise goals: None- due to lower extremity wound that is slow healing.   Behavioral modification strategies: increasing lean protein intake, no skipping meals, better snacking choices and planning for success.  Gentry has agreed to follow-up with our clinic in 2 weeks. He was informed of the importance of frequent follow-up visits to maximize his success with intensive lifestyle modifications for his multiple health conditions.   Objective:   Blood pressure (!) 128/53, pulse 60, temperature 97.9 F (36.6 C), height 5\' 9"  (1.753 m), weight 220 lb (99.8 kg), SpO2 94 %. Body mass index is 32.49 kg/m.  General: Cooperative, alert, well developed, in no acute distress. HEENT: Conjunctivae and lids unremarkable. Cardiovascular: Regular rhythm.  Lungs: Normal work of breathing. Neurologic: No focal deficits.   Lab Results  Component Value Date   CREATININE 0.93 05/23/2020   BUN 20 05/23/2020   NA 140 05/23/2020   K 4.9 05/23/2020   CL 102 05/23/2020   CO2 26 05/23/2020   Lab Results  Component Value Date   ALT 18 05/23/2020   AST 15 05/23/2020   ALKPHOS 117 05/23/2020   BILITOT 0.3 05/23/2020   Lab Results  Component Value Date   HGBA1C 6.5 (H) 05/23/2020   HGBA1C 6.4 02/23/2020   HGBA1C 6.5 (H) 12/06/2019   HGBA1C 5.6 07/01/2019   HGBA1C 5.9 (H)  04/24/2019   Lab Results  Component Value Date   INSULIN 16.7 05/23/2020   INSULIN 15.3 12/15/2018   INSULIN 29.0 (H) 08/25/2018   Lab Results  Component Value Date   TSH 3.19 02/23/2020   Lab Results  Component Value Date   CHOL 144 05/23/2020   HDL 34 (L) 05/23/2020   LDLCALC 84 05/23/2020   LDLDIRECT 104.9 12/12/2012   TRIG 147 05/23/2020     CHOLHDL 4 02/23/2020   Lab Results  Component Value Date   WBC 7.1 02/23/2020   HGB 13.0 02/23/2020   HCT 39.7 02/23/2020   MCV 94.4 02/23/2020   PLT 211.0 02/23/2020   Lab Results  Component Value Date   FERRITIN 311 12/04/2019   Obesity Behavioral Intervention:   Approximately 15 minutes were spent on the discussion below.  ASK: We discussed the diagnosis of obesity with [name] today and Esai agreed to give [name] permission to discuss obesity behavioral modification therapy today.  ASSESS: Laithan has the diagnosis of obesity and his BMI today is 32.5. Wilbert is in the action stage of change.   ADVISE: Iliya was educated on the multiple health risks of obesity as well as the benefit of weight loss to improve his health. He was advised of the need for long term treatment and the importance of lifestyle modifications to improve his current health and to decrease his risk of future health problems.  AGREE: Multiple dietary modification options and treatment options were discussed and Anne agreed to follow the recommendations documented in the above note.  ARRANGE: Garmon was educated on the importance of frequent visits to treat obesity as outlined per CMS and USPSTF guidelines and agreed to schedule his next follow up appointment today.  Attestation Statements:   Reviewed by clinician on day of visit: allergies, medications, problem list, medical history, surgical history, family history, social history, and previous encounter notes.  I, Michaelene Song, am acting as Location manager for [employer], NP-C   I have reviewed the above documentation for accuracy and completeness, and I agree with the above. -  Esaw Grandchild, NP

## 2020-07-28 ENCOUNTER — Encounter: Payer: Self-pay | Admitting: Physician Assistant

## 2020-07-28 ENCOUNTER — Ambulatory Visit: Payer: Medicare Other | Admitting: Physician Assistant

## 2020-07-28 ENCOUNTER — Other Ambulatory Visit: Payer: Self-pay

## 2020-07-28 DIAGNOSIS — L409 Psoriasis, unspecified: Secondary | ICD-10-CM | POA: Diagnosis not present

## 2020-07-28 MED ORDER — TRIAMCINOLONE ACETONIDE 10 MG/ML IJ SUSP
5.0000 mg | Freq: Once | INTRAMUSCULAR | Status: AC
  Administered 2020-07-28: 5 mg

## 2020-07-28 NOTE — Progress Notes (Signed)
   Follow up Visit  Subjective  [name] is a 74 y.o. male who presents for the following: Follow-up (psoriasis flare scalp patient needs samples). Is taking [place] [employer] and doing well. No headaches or diarrhea. Psoriasis has flared and his scalp is itching.  Objective  Well appearing patient in no apparent distress; mood and affect are within normal limits.  A focused examination was performed including scalp. Relevant physical exam findings are noted in the Assessment and Plan.   Objective  Left Occipital Scalp, Mid Occipital Scalp, Right Occipital Scalp: Well-marginated erythematous papules/plaques with silvery scale.   Assessment & Plan  Psoriasis (3) Left Occipital Scalp; Right Occipital Scalp; Mid Occipital Scalp  Otezla samples given  Intralesional injection - Left Occipital Scalp, Mid Occipital Scalp, Right Occipital Scalp Location: occipital scalp  Informed Consent: Discussed risks (infection, pain, bleeding, bruising, thinning of the skin, loss of skin pigment,  Indentation, lack of resolution, and recurrence of lesion) and benefits of the procedure, as well as the alternatives. Informed consent was obtained. Preparation: The area was prepared in a standard fashion.   Procedure Details: An intralesional injection was performed with Kenalog 5 mg/cc. 3 cc in total were injected.  Total number of injections: greater than 7  Plan: The patient was instructed on post-op care. Recommend OTC analgesia as needed for pain.

## 2020-08-01 DIAGNOSIS — H401131 Primary open-angle glaucoma, bilateral, mild stage: Secondary | ICD-10-CM | POA: Diagnosis not present

## 2020-08-02 ENCOUNTER — Ambulatory Visit: Payer: Medicare Other | Admitting: Podiatry

## 2020-08-02 ENCOUNTER — Other Ambulatory Visit: Payer: Self-pay

## 2020-08-02 DIAGNOSIS — E1142 Type 2 diabetes mellitus with diabetic polyneuropathy: Secondary | ICD-10-CM | POA: Diagnosis not present

## 2020-08-02 DIAGNOSIS — L97522 Non-pressure chronic ulcer of other part of left foot with fat layer exposed: Secondary | ICD-10-CM

## 2020-08-03 NOTE — Progress Notes (Signed)
Subjective: 74 year old male presents the office for follow-up evaluation of a wound on the left foot submetatarsal 1.  They have been continuing with the [place] dressing changes daily.  There is been some bloody drainage on the bandage.  No increase in swelling no redness or warmth.  No drainage or pus.  He does need a new surgical shoe as long as it is too large.  Denies any fevers, chills, nausea, vomiting.  No calf pain, chest pain, shortness of breath.   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  Ulceration submetatarsal 1 with hyperkeratotic periwound.  After debridement the wound measures 0.7 x 0.5 x 0.2 cm and is somewhat more superficial.  There is no probing, undermining or tunneling.  There is no surrounding erythema, ascending cellulitis.  No fluctuation or crepitation.  There is no malodor. No pain with calf compression, swelling, warmth, erythema  Assessment: Ulceration left foot  Plan: -All treatment options discussed with the patient including all alternatives, risks, complications.  -Sharply debrided the ulceration with a #312 blade scalpel without any complication including none healthy, bleeding, viable tissue. -Continue with prisma dressing changes  -Offloading-dispensed new surgical shoe with further offloading. -Recommend dressing off the foot is much as possible. -Monitor for any clinical signs or symptoms of infection and directed to call the office immediately should any occur or go to the ER.  [name]

## 2020-08-04 ENCOUNTER — Ambulatory Visit (INDEPENDENT_AMBULATORY_CARE_PROVIDER_SITE_OTHER): Payer: Medicare Other | Admitting: Adult Health

## 2020-08-09 ENCOUNTER — Ambulatory Visit (INDEPENDENT_AMBULATORY_CARE_PROVIDER_SITE_OTHER): Payer: Medicare Other | Admitting: Adult Health

## 2020-08-10 ENCOUNTER — Other Ambulatory Visit: Payer: Self-pay

## 2020-08-10 ENCOUNTER — Ambulatory Visit (INDEPENDENT_AMBULATORY_CARE_PROVIDER_SITE_OTHER): Payer: Medicare Other

## 2020-08-10 DIAGNOSIS — Z23 Encounter for immunization: Secondary | ICD-10-CM | POA: Diagnosis not present

## 2020-08-16 ENCOUNTER — Ambulatory Visit (INDEPENDENT_AMBULATORY_CARE_PROVIDER_SITE_OTHER): Payer: Medicare Other | Admitting: Adult Health

## 2020-08-16 ENCOUNTER — Encounter (INDEPENDENT_AMBULATORY_CARE_PROVIDER_SITE_OTHER): Payer: Self-pay | Admitting: Adult Health

## 2020-08-16 ENCOUNTER — Other Ambulatory Visit: Payer: Self-pay

## 2020-08-16 VITALS — BP 143/71 | HR 72 | Temp 98.0°F | Ht 69.0 in | Wt 220.0 lb

## 2020-08-16 DIAGNOSIS — F3289 Other specified depressive episodes: Secondary | ICD-10-CM

## 2020-08-16 DIAGNOSIS — E118 Type 2 diabetes mellitus with unspecified complications: Secondary | ICD-10-CM

## 2020-08-16 DIAGNOSIS — E669 Obesity, unspecified: Secondary | ICD-10-CM

## 2020-08-16 DIAGNOSIS — I152 Hypertension secondary to endocrine disorders: Secondary | ICD-10-CM

## 2020-08-16 DIAGNOSIS — Z6832 Body mass index (BMI) 32.0-32.9, adult: Secondary | ICD-10-CM | POA: Diagnosis not present

## 2020-08-16 DIAGNOSIS — E1159 Type 2 diabetes mellitus with other circulatory complications: Secondary | ICD-10-CM

## 2020-08-16 MED ORDER — BUPROPION HCL ER (SR) 100 MG PO TB12: 100.0000 mg | ORAL_TABLET | Freq: Every day | ORAL | 0 refills | Status: DC

## 2020-08-16 NOTE — Progress Notes (Signed)
Chief Complaint:   OBESITY [name] is here to discuss his progress with his obesity treatment plan along with follow-up of his obesity related diagnoses. [name] is on the Category 3 Plan and states he is following his eating plan approximately 50-60% of the time. [name] states he is exercising 0 minutes 0 times per week.  Today's visit was #: 21 Starting weight: 236 lbs Starting date: 08/25/2018 Today's weight: 220 lbs Today's date: 08/16/2020 Total lbs lost to date: 16 Total lbs lost since last in-office visit: 0  Interim History: [name] is frustrated that he cannot be more active due to wound on his left foot submetatarsal 1. He continues to be closely followed by Podiatry/Dr. Jacqualyn Park. He is still able to work at the [employer] and the [employer] with frequent periods of sitting. He is able to ambulate safely without an assistive device and he consistently wears surgical show on L foot to.  Subjective:   Type 2 diabetes mellitus with complications ([name]) without longterm insulin use. 05/23/2020 blood glucose 109, A1c 6.5 (slight bump from 6.4 on 02/23/2020) with an insulin level of 16.7. Ambulatory fasting blood glucose 80-130's. He denies symptoms of hypoglycemia with blood glucose less than 90.  Lab Results  Component Value Date   HGBA1C 6.5 (H) 05/23/2020   HGBA1C 6.4 02/23/2020   HGBA1C 6.5 (H) 12/06/2019   Lab Results  Component Value Date   MICROALBUR 0.6 12/12/2012   LDLCALC 84 05/23/2020   CREATININE 0.93 05/23/2020   Lab Results  Component Value Date   INSULIN 16.7 05/23/2020   INSULIN 15.3 12/15/2018   INSULIN 29.0 (H) 08/25/2018   Hypertension associated with type 2 diabetes mellitus ([name]). Blood pressure and heart rate are stable. [name] is on amlodipine 5 mg daily, Carvedilol 25 mg 1/2 tab BID with meal, and losartan 50 mg daily.  BP Readings from Last 3 Encounters:  08/16/20 (!) 143/71  07/21/20 (!) 128/53  07/07/20 117/64   Lab Results    Component Value Date   CREATININE 0.93 05/23/2020   CREATININE 1.00 02/23/2020   CREATININE 0.76 01/19/2020   Other depression,with emotional eating. [name] is struggling with emotional eating and using food for comfort to the extent that it is negatively impacting his health. He has been working on behavior modification techniques to help reduce his emotional eating and has been somewhat successful. He shows no sign of suicidal or homicidal ideations. Blood pressure and heart rate are stable at today's office visit. [name] is on bupropion SR 100 mg daily. He reports a significant reduction of cravings since restarting bupropion.  Assessment/Plan:   Type 2 diabetes mellitus with complications ([name]) without longterm insulin use. Good blood sugar control is important to decrease the likelihood of diabetic complications such as nephropathy, neuropathy, limb loss, blindness, coronary artery disease, and death. Intensive lifestyle modification including diet, exercise and weight loss are the first line of treatment for diabetes. [name] will continue his current anti-diabetic regimen as directed and consistently follow the Category 3 meal plan.  Hypertension associated with type 2 diabetes mellitus ([name]). [name] is working on healthy weight loss and exercise to improve blood pressure control. We will watch for signs of hypotension as he continues his lifestyle modifications. He will continue his current anti-hypertensive regimen as directed.   Other depression,with emotional eating. Behavior modification techniques were discussed today to help [name] deal with his emotional/non-hunger eating behaviors.  Orders and follow up as documented in patient record. Refill was given  for buPROPion (WELLBUTRIN SR) 100 MG 12 hr tablet #30 with 0 refills.  Class 1 obesity with serious comorbidity and body mass index (BMI) of 32.0 to 32.9 in adult, unspecified obesity type.  [name] is currently in the action stage of  change. As such, his goal is to continue with weight loss efforts. He has agreed to the Category 3 Plan.   He will follow the Category 3 meal plan more consistently and increase protein intake at meals.  Exercise goals: No exercise has been prescribed at this time.  Behavioral modification strategies: increasing lean protein intake, decreasing eating out, no skipping meals, meal planning and cooking strategies and planning for success.  [name] has agreed to follow-up with our clinic in 2 weeks. He was informed of the importance of frequent follow-up visits to maximize his success with intensive lifestyle modifications for his multiple health conditions.   Objective:   Blood pressure (!) 143/71, pulse 72, temperature 98 F (36.7 C), height 5\' 9"  (1.753 m), weight 220 lb (99.8 kg), SpO2 95 %. Body mass index is 32.49 kg/m.  General: Cooperative, alert, well developed, in no acute distress. HEENT: Conjunctivae and lids unremarkable. Cardiovascular: Regular rhythm.  Lungs: Normal work of breathing. Neurologic: No focal deficits.   Lab Results  Component Value Date   CREATININE 0.93 05/23/2020   BUN 20 05/23/2020   NA 140 05/23/2020   K 4.9 05/23/2020   CL 102 05/23/2020   CO2 26 05/23/2020   Lab Results  Component Value Date   ALT 18 05/23/2020   AST 15 05/23/2020   ALKPHOS 117 05/23/2020   BILITOT 0.3 05/23/2020   Lab Results  Component Value Date   HGBA1C 6.5 (H) 05/23/2020   HGBA1C 6.4 02/23/2020   HGBA1C 6.5 (H) 12/06/2019   HGBA1C 5.6 07/01/2019   HGBA1C 5.9 (H) 04/24/2019   Lab Results  Component Value Date   INSULIN 16.7 05/23/2020   INSULIN 15.3 12/15/2018   INSULIN 29.0 (H) 08/25/2018   Lab Results  Component Value Date   TSH 3.19 02/23/2020   Lab Results  Component Value Date   CHOL 144 05/23/2020   HDL 34 (L) 05/23/2020   LDLCALC 84 05/23/2020   LDLDIRECT 104.9 12/12/2012   TRIG 147 05/23/2020   CHOLHDL 4 02/23/2020   Lab Results  Component  Value Date   WBC 7.1 02/23/2020   HGB 13.0 02/23/2020   HCT 39.7 02/23/2020   MCV 94.4 02/23/2020   PLT 211.0 02/23/2020   Lab Results  Component Value Date   FERRITIN 311 12/04/2019   Obesity Behavioral Intervention:   Approximately 15 minutes were spent on the discussion below.  ASK: We discussed the diagnosis of obesity with [name] today and [name] agreed to give [name] permission to discuss obesity behavioral modification therapy today.  ASSESS: [name] has the diagnosis of obesity and his BMI today is 32.5. [name] is in the action stage of change.   ADVISE: Bain was educated on the multiple health risks of obesity as well as the benefit of weight loss to improve his health. He was advised of the need for long term treatment and the importance of lifestyle modifications to improve his current health and to decrease his risk of future health problems.  AGREE: Multiple dietary modification options and treatment options were discussed and Darik agreed to follow the recommendations documented in the above note.  ARRANGE: [name] was educated on the importance of frequent visits to treat obesity as outlined per CMS and USPSTF  guidelines and agreed to schedule his next follow up appointment today.  Attestation Statements:   Reviewed by clinician on day of visit: allergies, medications, problem list, medical history, surgical history, family history, social history, and previous encounter notes.  I, Michaelene Song, am acting as Location manager for [employer], NP-C   I have reviewed the above documentation for accuracy and completeness, and I agree with the above. -  Melanee Cordial d. Atthew Coutant, NP-C

## 2020-08-17 ENCOUNTER — Ambulatory Visit: Payer: Medicare Other | Admitting: Physician Assistant

## 2020-08-22 ENCOUNTER — Other Ambulatory Visit: Payer: Self-pay | Admitting: Family Medicine

## 2020-08-23 ENCOUNTER — Other Ambulatory Visit: Payer: Self-pay

## 2020-08-23 ENCOUNTER — Ambulatory Visit: Payer: Medicare Other | Admitting: Podiatry

## 2020-08-23 DIAGNOSIS — E1142 Type 2 diabetes mellitus with diabetic polyneuropathy: Secondary | ICD-10-CM | POA: Diagnosis not present

## 2020-08-23 DIAGNOSIS — M2042 Other hammer toe(s) (acquired), left foot: Secondary | ICD-10-CM | POA: Diagnosis not present

## 2020-08-23 DIAGNOSIS — L84 Corns and callosities: Secondary | ICD-10-CM

## 2020-08-23 DIAGNOSIS — L97522 Non-pressure chronic ulcer of other part of left foot with fat layer exposed: Secondary | ICD-10-CM

## 2020-08-23 DIAGNOSIS — M2041 Other hammer toe(s) (acquired), right foot: Secondary | ICD-10-CM

## 2020-08-24 NOTE — Progress Notes (Signed)
Subjective: 74 year old male presents the office for follow-up evaluation of a wound on the left foot submetatarsal 1. His wife has been changing the bandage and they have been applying Prisma to the wound daily.  Patient has a minimal bloody drainage but no purulence or other drainage identified.  There is no surrounding redness or red streaks to report.  He has been working all he tries to sit at work.  Denies any fevers, chills, nausea, vomiting.  No calf pain, chest, shortness of breath.   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  Ulceration submetatarsal 1 with hyperkeratotic periwound.  After debridement the wound measures 0.7 x 0.6 x 0.2 cm multiple to last appointment.  There is no surrounding erythema, ascending cellulitis but there is no fluctuation crepitation but there is no malodor.  No obvious signs of infection noted today. Hammertoes are present with preulcerative.  There is a second toe but there is no edema, erythema or signs of infection. No pain with calf compression, swelling, warmth, erythema  Assessment: Ulceration left foot  Plan: -All treatment options discussed with the patient including all alternatives, risks, complications.  -Sharply debrided the ulceration with a #312 blade scalpel without any complication including none healthy, bleeding, viable tissue. -Wound was cleaned and Prisma was applied.  May switch to using a silver alginate dressing.  This was ordered today through prism -Discussed that if he continues to have the wound possible total contact cast. -Continue offloading and recommend staying off of his foot.  Also discussed surgical intervention to remove the sesamoids. -Hammertoes are still evident.  Discussed offloading possible surgical intervention in future to help to pressure particular at the distal second toe.  [name]

## 2020-08-26 ENCOUNTER — Telehealth: Payer: Self-pay

## 2020-08-26 DIAGNOSIS — L97521 Non-pressure chronic ulcer of other part of left foot limited to breakdown of skin: Secondary | ICD-10-CM | POA: Diagnosis not present

## 2020-08-26 NOTE — Telephone Encounter (Signed)
Error

## 2020-08-30 ENCOUNTER — Ambulatory Visit (INDEPENDENT_AMBULATORY_CARE_PROVIDER_SITE_OTHER): Payer: Medicare Other | Admitting: Adult Health

## 2020-08-30 ENCOUNTER — Encounter (INDEPENDENT_AMBULATORY_CARE_PROVIDER_SITE_OTHER): Payer: Self-pay | Admitting: Adult Health

## 2020-08-30 VITALS — BP 138/63 | HR 71 | Temp 98.2°F | Ht 69.0 in | Wt 217.0 lb

## 2020-08-30 DIAGNOSIS — I152 Hypertension secondary to endocrine disorders: Secondary | ICD-10-CM | POA: Diagnosis not present

## 2020-08-30 DIAGNOSIS — S91302A Unspecified open wound, left foot, initial encounter: Secondary | ICD-10-CM

## 2020-08-30 DIAGNOSIS — Z6832 Body mass index (BMI) 32.0-32.9, adult: Secondary | ICD-10-CM

## 2020-08-30 DIAGNOSIS — E1159 Type 2 diabetes mellitus with other circulatory complications: Secondary | ICD-10-CM

## 2020-08-30 DIAGNOSIS — E118 Type 2 diabetes mellitus with unspecified complications: Secondary | ICD-10-CM | POA: Diagnosis not present

## 2020-08-30 DIAGNOSIS — F3289 Other specified depressive episodes: Secondary | ICD-10-CM

## 2020-08-30 DIAGNOSIS — E669 Obesity, unspecified: Secondary | ICD-10-CM

## 2020-08-30 MED ORDER — BUPROPION HCL ER (SR) 100 MG PO TB12: 100.0000 mg | ORAL_TABLET | Freq: Every day | ORAL | 0 refills | Status: DC

## 2020-08-31 DIAGNOSIS — S91302A Unspecified open wound, left foot, initial encounter: Secondary | ICD-10-CM | POA: Insufficient documentation

## 2020-08-31 NOTE — Progress Notes (Signed)
Chief Complaint:   OBESITY [name] is here to discuss his progress with his obesity treatment plan along with follow-up of his obesity related diagnoses. [name] is on the Category 3 Plan and states he is following his eating plan approximately 70% of the time. [name] states he is walking for exercise.  Today's visit was #: 40 Starting weight: 236 lbs Starting date: 08/25/2018 Today's weight: 217 lbs Today's date: 08/30/2020 Total lbs lost to date: 19 Total lbs lost since last in-office visit: 3  Interim History: Reason is able to ambulate better with smaller surgical shoe. He manages pain with PRN Norco, rest, and elevating left lower extremity when able. He has been following the Category 3 meal plan greater than 70% of the time and denies polyphagia or excessive cravings.  Subjective:   Hypertension associated with type 2 diabetes mellitus ([name]). Blood pressure and heart rate were stable on today's office visit. [name] denies cardiac symptoms. He is on amlodipine 5 mg (denies lower extremity edema) and carvedilol 25 mg 1/2 tab BID with meals.  BP Readings from Last 3 Encounters:  08/30/20 138/63  08/16/20 (!) 143/71  07/21/20 (!) 128/53   Lab Results  Component Value Date   CREATININE 0.93 05/23/2020   CREATININE 1.00 02/23/2020   CREATININE 0.76 01/19/2020   Type 2 diabetes mellitus with complications ([name]) without longterm insulin use. 05/23/2020 A1c 6.5, at goal. GFR greater than 60. When [name] checks his fasting blood glucose levels, they run 130-140's. He denies episodes of hypoglycemia. He is on metformin 1000 mg daily and Amaryl 2 mg BID.  Lab Results  Component Value Date   HGBA1C 6.5 (H) 05/23/2020   HGBA1C 6.4 02/23/2020   HGBA1C 6.5 (H) 12/06/2019   Lab Results  Component Value Date   MICROALBUR 0.6 12/12/2012   LDLCALC 84 05/23/2020   CREATININE 0.93 05/23/2020   Lab Results  Component Value Date   INSULIN 16.7 05/23/2020   INSULIN 15.3  12/15/2018   INSULIN 29.0 (H) 08/25/2018   Wound of left foot submetatarsal 1. Podiatry is considering casting and recommends offloading/remaining off the left foot as often as possible. Surgical intervention has been considered to remove sesamoids and correct hammertoe.  Other depression,with emotional eating. [name] is struggling with emotional eating and using food for comfort to the extent that it is negatively impacting his health. He has been working on behavior modification techniques to help reduce his emotional eating and has been somewhat successful. He shows no sign of suicidal or homicidal ideations. [name] continues to report excellent control of nighttime cravings. He denies history of seizures. Blood pressure and heart rate stable. 12/07/2019 EKG was normal.  Assessment/Plan:   Hypertension associated with type 2 diabetes mellitus ([name]). [name] is working on healthy weight loss and exercise to improve blood pressure control. We will watch for signs of hypotension as he continues his lifestyle modifications. He will continue his current antihypertensive therapy as directed and will continue to follow the Category 3 meal plan. Labs will be checked at his next office visit.  Type 2 diabetes mellitus with complications ([name]) without longterm insulin use. Good blood sugar control is important to decrease the likelihood of diabetic complications such as nephropathy, neuropathy, limb loss, blindness, coronary artery disease, and death. Intensive lifestyle modification including diet, exercise and weight loss are the first line of treatment for diabetes. [name] will continue his current antidiabetic regimen as directed. Labs will be checked at his next office visit.  Wound of left foot submetatarsal 1. [name] will continue follow-up with Podiatry as directed.   Other depression,with emotional eating. Behavior modification techniques were discussed today to help [name] deal with his  emotional/non-hunger eating behaviors.  Orders and follow up as documented in patient record. Refill was given for buPROPion (WELLBUTRIN SR) 100 MG 12 hr tablet daily #30 with 0 refills.  Class 1 obesity with serious comorbidity and body mass index (BMI) of 32.0 to 32.9 in adult, unspecified obesity type.  [name] is currently in the action stage of change. As such, his goal is to continue with weight loss efforts. He has agreed to the Category 3 Plan.   Fasting labs will be checked at the time of his next office visit.  Exercise goals: [name] will continue walking for exercise.  Behavioral modification strategies: increasing lean protein intake, decreasing simple carbohydrates, meal planning and cooking strategies and planning for success.  [name] has agreed to follow-up with our clinic fasting in 2 weeks. He was informed of the importance of frequent follow-up visits to maximize his success with intensive lifestyle modifications for his multiple health conditions.   Objective:   Blood pressure 138/63, pulse 71, temperature 98.2 F (36.8 C), height 5\' 9"  (1.753 m), weight 217 lb (98.4 kg), SpO2 96 %. Body mass index is 32.05 kg/m.  General: Cooperative, alert, well developed, in no acute distress. HEENT: Conjunctivae and lids unremarkable. Cardiovascular: Regular rhythm.  Lungs: Normal work of breathing. Neurologic: No focal deficits.   Lab Results  Component Value Date   CREATININE 0.93 05/23/2020   BUN 20 05/23/2020   NA 140 05/23/2020   K 4.9 05/23/2020   CL 102 05/23/2020   CO2 26 05/23/2020   Lab Results  Component Value Date   ALT 18 05/23/2020   AST 15 05/23/2020   ALKPHOS 117 05/23/2020   BILITOT 0.3 05/23/2020   Lab Results  Component Value Date   HGBA1C 6.5 (H) 05/23/2020   HGBA1C 6.4 02/23/2020   HGBA1C 6.5 (H) 12/06/2019   HGBA1C 5.6 07/01/2019   HGBA1C 5.9 (H) 04/24/2019   Lab Results  Component Value Date   INSULIN 16.7 05/23/2020   INSULIN 15.3  12/15/2018   INSULIN 29.0 (H) 08/25/2018   Lab Results  Component Value Date   TSH 3.19 02/23/2020   Lab Results  Component Value Date   CHOL 144 05/23/2020   HDL 34 (L) 05/23/2020   LDLCALC 84 05/23/2020   LDLDIRECT 104.9 12/12/2012   TRIG 147 05/23/2020   CHOLHDL 4 02/23/2020   Lab Results  Component Value Date   WBC 7.1 02/23/2020   HGB 13.0 02/23/2020   HCT 39.7 02/23/2020   MCV 94.4 02/23/2020   PLT 211.0 02/23/2020   Lab Results  Component Value Date   FERRITIN 311 12/04/2019   Obesity Behavioral Intervention:   Approximately 15 minutes were spent on the discussion below.  ASK: We discussed the diagnosis of obesity with [name] today and [name] agreed to give [name] permission to discuss obesity behavioral modification therapy today.  ASSESS: [name] has the diagnosis of obesity and his BMI today is 32.1. Nestor is in the action stage of change.   ADVISE: [name] was educated on the multiple health risks of obesity as well as the benefit of weight loss to improve his health. He was advised of the need for long term treatment and the importance of lifestyle modifications to improve his current health and to decrease his risk of future health problems.  AGREE:  Multiple dietary modification options and treatment options were discussed and [name] agreed to follow the recommendations documented in the above note.  ARRANGE: [name] was educated on the importance of frequent visits to treat obesity as outlined per CMS and USPSTF guidelines and agreed to schedule his next follow up appointment today.  Attestation Statements:   Reviewed by clinician on day of visit: allergies, medications, problem list, medical history, surgical history, family history, social history, and previous encounter notes.  I, Michaelene Song, am acting as Location manager for [employer], NP-C   I have reviewed the above documentation for accuracy and completeness, and I agree with the above. -  Maryn Freelove d.  Donald Blandon, NP-C

## 2020-09-04 ENCOUNTER — Other Ambulatory Visit (INDEPENDENT_AMBULATORY_CARE_PROVIDER_SITE_OTHER): Payer: Self-pay | Admitting: Adult Health

## 2020-09-04 DIAGNOSIS — F3289 Other specified depressive episodes: Secondary | ICD-10-CM

## 2020-09-05 NOTE — Telephone Encounter (Signed)
[name] pt

## 2020-09-13 ENCOUNTER — Encounter: Payer: Self-pay | Admitting: Podiatry

## 2020-09-13 ENCOUNTER — Other Ambulatory Visit: Payer: Self-pay

## 2020-09-13 ENCOUNTER — Ambulatory Visit (INDEPENDENT_AMBULATORY_CARE_PROVIDER_SITE_OTHER): Payer: Medicare Other | Admitting: Podiatry

## 2020-09-13 DIAGNOSIS — L97522 Non-pressure chronic ulcer of other part of left foot with fat layer exposed: Secondary | ICD-10-CM | POA: Diagnosis not present

## 2020-09-14 ENCOUNTER — Encounter (INDEPENDENT_AMBULATORY_CARE_PROVIDER_SITE_OTHER): Payer: Self-pay | Admitting: Adult Health

## 2020-09-14 ENCOUNTER — Other Ambulatory Visit: Payer: Self-pay

## 2020-09-14 ENCOUNTER — Ambulatory Visit (INDEPENDENT_AMBULATORY_CARE_PROVIDER_SITE_OTHER): Payer: Medicare Other | Admitting: Adult Health

## 2020-09-14 VITALS — BP 106/61 | HR 71 | Temp 97.9°F | Ht 69.0 in | Wt 219.0 lb

## 2020-09-14 DIAGNOSIS — F3289 Other specified depressive episodes: Secondary | ICD-10-CM

## 2020-09-14 DIAGNOSIS — E1159 Type 2 diabetes mellitus with other circulatory complications: Secondary | ICD-10-CM

## 2020-09-14 DIAGNOSIS — E118 Type 2 diabetes mellitus with unspecified complications: Secondary | ICD-10-CM

## 2020-09-14 DIAGNOSIS — E669 Obesity, unspecified: Secondary | ICD-10-CM

## 2020-09-14 DIAGNOSIS — I152 Hypertension secondary to endocrine disorders: Secondary | ICD-10-CM | POA: Diagnosis not present

## 2020-09-14 NOTE — Progress Notes (Signed)
Chief Complaint:   [name] is here to discuss his progress with his Donald treatment plan along with follow-up of his Donald related diagnoses. [name] is on the Category 3 Plan and states he is following his eating plan approximately 65-70% of the time. [name] states he is exercising 0 minutes 0 times per week.  Today's visit was #: 34 Starting weight: 236 lbs Starting date: 08/25/2018 Today's weight: 219 lbs Today's date: 09/14/2020 Total lbs lost to date: 17 Total lbs lost since last in-office visit: 0  Interim History: [name] is unable to work at the [employer] while he has a surgical shoe. He feels that the time off has allowed his left foot time to heal - wonderful! He will follow-up with Podiatry in 2 weeks.  He has deviated from Cat 3 slightly but feels that he is ready to re-focus and get back on track.  Subjective:   Type 2 diabetes mellitus with complications ([place]) without longterm insulin use. 05/23/2020 A1c 6.5, at goal. CMP showed GFR 82. Ambulatory fasting blood sugars 120-130's. [name] denies episodes of hypoglycemia. He is on metformin 1000 mg and Amaryl 2 mg BID.   Lab Results  Component Value Date   HGBA1C 6.5 (H) 05/23/2020   HGBA1C 6.4 02/23/2020   HGBA1C 6.5 (H) 12/06/2019   Lab Results  Component Value Date   MICROALBUR 0.6 12/12/2012   LDLCALC 84 05/23/2020   CREATININE 0.93 05/23/2020   Lab Results  Component Value Date   INSULIN 16.7 05/23/2020   INSULIN 15.3 12/15/2018   INSULIN 29.0 (H) 08/25/2018   Hypertension associated with type 2 diabetes mellitus ([place]). Blood pressure and heart rate are stable on today's office visit. [name] is on losartan 50 mg daily, Coreg 25 mg 1/2 tab BID, and Norvasc 5 mg daily. He denies symptoms of hypotension. He does not check his blood pressure at home.  BP Readings from Last 3 Encounters:  09/14/20 106/61  08/30/20 138/63  08/16/20 (!) 143/71   Lab Results  Component Value Date    CREATININE 0.93 05/23/2020   CREATININE 1.00 02/23/2020   CREATININE 0.76 01/19/2020   Other depression,with emotional eating. [name] is struggling with emotional eating and using food for comfort to the extent that it is negatively impacting his health. He has been working on behavior modification techniques to help reduce his emotional eating and has been somewhat successful. He shows no sign of suicidal or homicidal ideations.Blood pressure and heart rate are excellent on today's office visit.  [name] denies history of seizure. He reports excellent control of his cravings.  Assessment/Plan:   Type 2 diabetes mellitus with complications ([place]) without longterm insulin use. Good blood sugar control is important to decrease the likelihood of diabetic complications such as nephropathy, neuropathy, limb loss, blindness, coronary artery disease, and death. Intensive lifestyle modification including diet, exercise and weight loss are the first line of treatment for diabetes. Labs will be checked at the time of his next office visit. [name] will continue his current medications as directed.   Hypertension associated with type 2 diabetes mellitus ([place]). [name] is working on healthy weight loss and exercise to improve blood pressure control. We will watch for signs of hypotension as he continues his lifestyle modifications. He will continue his current antihypertensive therapy as directed.   Other depression,with emotional eating. Behavior modification techniques were discussed today to help [name] deal with his emotional/non-hunger eating behaviors.  Orders and follow up as documented in patient  record. We can only refill prescription every 30 days - last refill was 08/30/2020.  Class 1 Donald with serious comorbidity and body mass index (BMI) of 32.0 to 32.9 in adult, unspecified Donald type.  [name] is currently in the action stage of change. As such, his goal is to continue with weight loss efforts. He has  agreed to the Category 3 Plan.   Fasting labs will be obtained at the time of his next office visit.  Handout was provided on Thanksgiving.  Exercise goals: No exercise has been prescribed at this time.  Behavioral modification strategies: increasing lean protein intake, decreasing simple carbohydrates, no skipping meals, meal planning and cooking strategies and planning for success.  [name] has agreed to follow-up with our clinic fasting in 3 weeks. He was informed of the importance of frequent follow-up visits to maximize his success with intensive lifestyle modifications for his multiple health conditions.   Objective:   Blood pressure 106/61, pulse 71, temperature 97.9 F (36.6 C), height 5\' 9"  (1.753 m), weight 219 lb (99.3 kg), SpO2 96 %. Body mass index is 32.34 kg/m.  General: Cooperative, alert, well developed, in no acute distress. HEENT: Conjunctivae and lids unremarkable. Cardiovascular: Regular rhythm.  Lungs: Normal work of breathing. Neurologic: No focal deficits.   Lab Results  Component Value Date   CREATININE 0.93 05/23/2020   BUN 20 05/23/2020   NA 140 05/23/2020   K 4.9 05/23/2020   CL 102 05/23/2020   CO2 26 05/23/2020   Lab Results  Component Value Date   ALT 18 05/23/2020   AST 15 05/23/2020   ALKPHOS 117 05/23/2020   BILITOT 0.3 05/23/2020   Lab Results  Component Value Date   HGBA1C 6.5 (H) 05/23/2020   HGBA1C 6.4 02/23/2020   HGBA1C 6.5 (H) 12/06/2019   HGBA1C 5.6 07/01/2019   HGBA1C 5.9 (H) 04/24/2019   Lab Results  Component Value Date   INSULIN 16.7 05/23/2020   INSULIN 15.3 12/15/2018   INSULIN 29.0 (H) 08/25/2018   Lab Results  Component Value Date   TSH 3.19 02/23/2020   Lab Results  Component Value Date   CHOL 144 05/23/2020   HDL 34 (L) 05/23/2020   LDLCALC 84 05/23/2020   LDLDIRECT 104.9 12/12/2012   TRIG 147 05/23/2020   CHOLHDL 4 02/23/2020   Lab Results  Component Value Date   WBC 7.1 02/23/2020   HGB 13.0  02/23/2020   HCT 39.7 02/23/2020   MCV 94.4 02/23/2020   PLT 211.0 02/23/2020   Lab Results  Component Value Date   FERRITIN 311 12/04/2019   Attestation Statements:   Reviewed by clinician on day of visit: allergies, medications, problem list, medical history, surgical history, family history, social history, and previous encounter notes.  Time spent on visit including pre-visit chart review and post-visit charting and care was 27 minutes.   I, Michaelene Song, am acting as Location manager for [employer], NP-C   I have reviewed the above documentation for accuracy and completeness, and I agree with the above. -  Madaleine Simmon d. Caesar Mannella, NP-C

## 2020-09-14 NOTE — Progress Notes (Signed)
Subjective: 74 year old male presents the office for follow-up evaluation of a wound on the left foot submetatarsal 1.  His wife states that there is not been much drainage.  Continue with Prisma dressing changes daily.  Denies any increase in swelling or redness and denies any pus.  No pain to the area.  He has no other concerns today.  He did discuss today that he has been working long days.  He does try to sit when he works.  Denies any fevers, chills, nausea, vomiting.  No calf pain, chest, shortness of breath.   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  Ulceration submetatarsal 1 with hyperkeratotic periwound.  After debridement the wound measures 0.5 x 0.6 x 0.2 cm.  There is no drainage or pus identified today.  There is no surrounding erythema, ascending cellulitis but there is no fluctuation crepitation but there is no malodor.  No obvious signs of infection noted today. Hammertoes are present without any ulcerations today.  No pain with calf compression, swelling, warmth, erythema  Assessment: Ulceration left foot  Plan: -All treatment options discussed with the patient including all alternatives, risks, complications.  -Sharply debrided the ulceration with a #312 blade scalpel without any complication including none healthy, bleeding, viable tissue. -Wound was cleaned and Prisma was reapplied.   -If not healing consider TCC.  -Continue offloading and recommend staying off of his foot.  Also discussed surgical intervention to remove the sesamoids.  [name]

## 2020-09-27 ENCOUNTER — Other Ambulatory Visit: Payer: Self-pay

## 2020-09-27 ENCOUNTER — Ambulatory Visit: Payer: Medicare Other | Admitting: Podiatry

## 2020-09-27 DIAGNOSIS — L97522 Non-pressure chronic ulcer of other part of left foot with fat layer exposed: Secondary | ICD-10-CM | POA: Diagnosis not present

## 2020-10-03 NOTE — Progress Notes (Signed)
Subjective: 74 year old male presents the office for follow-up evaluation of a wound on the left foot submetatarsal 1.  The wound is doing much better.  If you continue with Prisma dressing changes daily.  There is no drainage or pus identified no swelling or redness.  Preulcerative callus still present on the second toe but denies any opening. Denies any fevers, chills, nausea, vomiting.  No calf pain, chest, shortness of breath.   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  Ulceration submetatarsal 1 with hyperkeratotic periwound.  After debridement the wound measures 0.4 x 0.3 x 0.2 cm.  There is no drainage or pus identified today.  There is no surrounding erythema, ascending cellulitis but there is no fluctuation crepitation but there is no malodor.  No obvious signs of infection noted today. Preulcerative hyperkeratotic lesion distal aspect left second toe without any underlying ulceration drainage or signs of infection. Hammertoes are present without any ulcerations today.  No pain with calf compression, swelling, warmth, erythema  Assessment: Ulceration left foot; preulcerative callus  Plan: -All treatment options discussed with the patient including all alternatives, risks, complications.  -Sharply debrided the ulceration with a #312 blade scalpel without any complication including none healthy, bleeding, viable tissue to promote wound healing. -Wound was cleaned and Prisma was reapplied.   -Monitor left second toe.  No open sore identified at this time.  [name]

## 2020-10-04 DIAGNOSIS — Z03818 Encounter for observation for suspected exposure to other biological agents ruled out: Secondary | ICD-10-CM | POA: Diagnosis not present

## 2020-10-05 ENCOUNTER — Encounter (INDEPENDENT_AMBULATORY_CARE_PROVIDER_SITE_OTHER): Payer: Self-pay | Admitting: Adult Health

## 2020-10-05 ENCOUNTER — Other Ambulatory Visit: Payer: Self-pay

## 2020-10-05 ENCOUNTER — Ambulatory Visit (INDEPENDENT_AMBULATORY_CARE_PROVIDER_SITE_OTHER): Payer: Medicare Other | Admitting: Adult Health

## 2020-10-05 VITALS — BP 136/66 | HR 67 | Temp 97.4°F | Ht 69.0 in | Wt 219.0 lb

## 2020-10-05 DIAGNOSIS — E118 Type 2 diabetes mellitus with unspecified complications: Secondary | ICD-10-CM

## 2020-10-05 DIAGNOSIS — E1159 Type 2 diabetes mellitus with other circulatory complications: Secondary | ICD-10-CM

## 2020-10-05 DIAGNOSIS — E669 Obesity, unspecified: Secondary | ICD-10-CM | POA: Diagnosis not present

## 2020-10-05 DIAGNOSIS — E1169 Type 2 diabetes mellitus with other specified complication: Secondary | ICD-10-CM

## 2020-10-05 DIAGNOSIS — F3289 Other specified depressive episodes: Secondary | ICD-10-CM

## 2020-10-05 DIAGNOSIS — E785 Hyperlipidemia, unspecified: Secondary | ICD-10-CM

## 2020-10-05 DIAGNOSIS — Z6832 Body mass index (BMI) 32.0-32.9, adult: Secondary | ICD-10-CM | POA: Diagnosis not present

## 2020-10-05 DIAGNOSIS — I152 Hypertension secondary to endocrine disorders: Secondary | ICD-10-CM

## 2020-10-05 MED ORDER — BUPROPION HCL ER (SR) 100 MG PO TB12: 100.0000 mg | ORAL_TABLET | Freq: Every day | ORAL | 0 refills | Status: DC

## 2020-10-05 NOTE — Progress Notes (Signed)
Chief Complaint:   OBESITY [name] is here to discuss his progress with his obesity treatment plan along with follow-up of his obesity related diagnoses. [name] is on the Category 3 Plan and states he is following his eating plan approximately 70% of the time. [name] states he is exercising 0 minutes 0 times per week.  Today's visit was #: 84 Starting weight: 236 lbs Starting date: 08/25/2018 Today's weight: 219 lbs Today's date: 10/05/2020 Total lbs lost to date: 17 Total lbs lost since last in-office visit: 0  Interim History: For Thanksgiving [name] had ham, green beans, deviled eggs, cranberry sauce, and candied yams (with Splenda). He focused on PC/[name] during holiday eating.  L foot wound was debrided by [facility] on 09/27/20- he is still in surgical shoe and will f/u with Dr. Jacqualyn Park in 2 weeks.   Subjective:   Type 2 diabetes mellitus with complications ([place]). 05/23/2020 A1c 6.5, at goal. [name] is on metformin 1000 mg daily and glimepiride 2 mg daily.   Lab Results  Component Value Date   HGBA1C 6.5 (H) 05/23/2020   HGBA1C 6.4 02/23/2020   HGBA1C 6.5 (H) 12/06/2019   Lab Results  Component Value Date   MICROALBUR 0.6 12/12/2012   LDLCALC 84 05/23/2020   CREATININE 0.93 05/23/2020   Lab Results  Component Value Date   INSULIN 16.7 05/23/2020   INSULIN 15.3 12/15/2018   INSULIN 29.0 (H) 08/25/2018   Other depression,with emotional eating. [name] is struggling with emotional eating and using food for comfort to the extent that it is negatively impacting his health. He has been working on behavior modification techniques to help reduce his emotional eating and has been somewhat successful. He shows no sign of suicidal or homicidal ideations. Blood pressure and heart rate are excellent on today's office visit. [name] reports excellent control of cravings on bupropion SR 100 mg daily.  Hyperlipidemia associated with type 2 diabetes mellitus ([place]). Lipid panel on  05/23/2020 showed LDL 84, above goal of 70 for diabetics. [name] is on atorvastatin 80 mg daily.   Lab Results  Component Value Date   CHOL 144 05/23/2020   HDL 34 (L) 05/23/2020   LDLCALC 84 05/23/2020   LDLDIRECT 104.9 12/12/2012   TRIG 147 05/23/2020   CHOLHDL 4 02/23/2020   Lab Results  Component Value Date   ALT 18 05/23/2020   AST 15 05/23/2020   ALKPHOS 117 05/23/2020   BILITOT 0.3 05/23/2020   The 10-year ASCVD risk score [name] Bussing DC Jr., et al., 2013) is: 47%   Values used to calculate the score:     Age: 74 years     Sex: Male     Is Non-Hispanic African American: No     Diabetic: Yes     Tobacco smoker: No     Systolic Blood Pressure: 347 mmHg     Is BP treated: Yes     HDL Cholesterol: 34 mg/dL     Total Cholesterol: 144 mg/dL  Hypertension associated with type 2 diabetes mellitus ([place]). Blood pressure and heart rate are at goal on today's office visit.   BP Readings from Last 3 Encounters:  10/05/20 136/66  09/14/20 106/61  08/30/20 138/63   Lab Results  Component Value Date   CREATININE 0.93 05/23/2020   CREATININE 1.00 02/23/2020   CREATININE 0.76 01/19/2020   Assessment/Plan:   Type 2 diabetes mellitus with complications ([place]). Good blood sugar control is important to decrease the likelihood of diabetic complications such  as nephropathy, neuropathy, limb loss, blindness, coronary artery disease, and death. Intensive lifestyle modification including diet, exercise and weight loss are the first line of treatment for diabetes. Labs will be checked today.   Other depression,with emotional eating. Behavior modification techniques were discussed today to help [name] deal with his emotional/non-hunger eating behaviors.  Orders and follow up as documented in patient record. Refill was given for buPROPion (WELLBUTRIN SR) 100 MG 12 hr tablet daily #30 with 0 refills.  Hyperlipidemia associated with type 2 diabetes mellitus ([place]). Cardiovascular risk and specific  lipid/LDL goals reviewed.  We discussed several lifestyle modifications today and [name] will continue to work on diet, exercise and weight loss efforts. Orders and follow up as documented in patient record. Labs will be checked today.   Counseling Intensive lifestyle modifications are the first line treatment for this issue. . Dietary changes: Increase soluble fiber. Decrease simple carbohydrates. . Exercise changes: Moderate to vigorous-intensity aerobic activity 150 minutes per week if tolerated. . Lipid-lowering medications: see documented in medical record.  Hypertension associated with type 2 diabetes mellitus ([place]). [name] is working on healthy weight loss and exercise to improve blood pressure control. We will watch for signs of hypotension as he continues his lifestyle modifications. Labs will be checked today.   Class 1 obesity with serious comorbidity and body mass index (BMI) of 32.0 to 32.9 in adult, unspecified obesity type.  [name] is currently in the action stage of change. As such, his goal is to continue with weight loss efforts. He has agreed to the Category 3 Plan.   Exercise goals: No exercise has been prescribed at this time. He is unable to exercise due to left lower extremity wound.  Behavioral modification strategies: increasing lean protein intake, meal planning and cooking strategies and planning for success.  [name] has agreed to follow-up with our clinic in 2 weeks. He was informed of the importance of frequent follow-up visits to maximize his success with intensive lifestyle modifications for his multiple health conditions.   [name] was informed we would discuss his lab results at his next visit unless there is a critical issue that needs to be addressed sooner. [name] agreed to keep his next visit at the agreed upon time to discuss these results.  Objective:   Blood pressure 136/66, pulse 67, temperature (!) 97.4 F (36.3 C), height 5\' 9"  (1.753 m), weight 219 lb  (99.3 kg), SpO2 96 %. Body mass index is 32.34 kg/m.  General: Cooperative, alert, well developed, in no acute distress. HEENT: Conjunctivae and lids unremarkable. Cardiovascular: Regular rhythm.  Lungs: Normal work of breathing. Neurologic: No focal deficits.   Lab Results  Component Value Date   CREATININE 0.93 05/23/2020   BUN 20 05/23/2020   NA 140 05/23/2020   K 4.9 05/23/2020   CL 102 05/23/2020   CO2 26 05/23/2020   Lab Results  Component Value Date   ALT 18 05/23/2020   AST 15 05/23/2020   ALKPHOS 117 05/23/2020   BILITOT 0.3 05/23/2020   Lab Results  Component Value Date   HGBA1C 6.5 (H) 05/23/2020   HGBA1C 6.4 02/23/2020   HGBA1C 6.5 (H) 12/06/2019   HGBA1C 5.6 07/01/2019   HGBA1C 5.9 (H) 04/24/2019   Lab Results  Component Value Date   INSULIN 16.7 05/23/2020   INSULIN 15.3 12/15/2018   INSULIN 29.0 (H) 08/25/2018   Lab Results  Component Value Date   TSH 3.19 02/23/2020   Lab Results  Component Value Date   CHOL  144 05/23/2020   HDL 34 (L) 05/23/2020   LDLCALC 84 05/23/2020   LDLDIRECT 104.9 12/12/2012   TRIG 147 05/23/2020   CHOLHDL 4 02/23/2020   Lab Results  Component Value Date   WBC 7.1 02/23/2020   HGB 13.0 02/23/2020   HCT 39.7 02/23/2020   MCV 94.4 02/23/2020   PLT 211.0 02/23/2020   Lab Results  Component Value Date   FERRITIN 311 12/04/2019   Obesity Behavioral Intervention:   Approximately 15 minutes were spent on the discussion below.  ASK: We discussed the diagnosis of obesity with [name] today and [name] agreed to give [name] permission to discuss obesity behavioral modification therapy today.  ASSESS: [name] has the diagnosis of obesity and his BMI today is 32.4. Andreu is in the action stage of change.   ADVISE: Arif was educated on the multiple health risks of obesity as well as the benefit of weight loss to improve his health. He was advised of the need for long term treatment and the importance of lifestyle  modifications to improve his current health and to decrease his risk of future health problems.  AGREE: Multiple dietary modification options and treatment options were discussed and Gor agreed to follow the recommendations documented in the above note.  ARRANGE: Manus was educated on the importance of frequent visits to treat obesity as outlined per CMS and USPSTF guidelines and agreed to schedule his next follow up appointment today.  Attestation Statements:   Reviewed by clinician on day of visit: allergies, medications, problem list, medical history, surgical history, family history, social history, and previous encounter notes.  I, Michaelene Song, am acting as Location manager for [employer], NP-C   I have reviewed the above documentation for accuracy and completeness, and I agree with the above. -  Jing Howatt d. Knowledge [place], NP-C

## 2020-10-06 LAB — COMPREHENSIVE METABOLIC PANEL
ALT: 28 IU/L (ref 0–44)
AST: 20 IU/L (ref 0–40)
Albumin/Globulin Ratio: 1.8 (ref 1.2–2.2)
Albumin: 4.2 g/dL (ref 3.7–4.7)
Alkaline Phosphatase: 103 IU/L (ref 44–121)
BUN/Creatinine Ratio: 19 (ref 10–24)
BUN: 17 mg/dL (ref 8–27)
Bilirubin Total: 0.3 mg/dL (ref 0.0–1.2)
CO2: 26 mmol/L (ref 20–29)
Calcium: 9.2 mg/dL (ref 8.6–10.2)
Chloride: 104 mmol/L (ref 96–106)
Creatinine, Ser: 0.88 mg/dL (ref 0.76–1.27)
GFR calc Af Amer: 99 mL/min/{1.73_m2} (ref >59)
GFR calc non Af Amer: 85 mL/min/{1.73_m2} (ref >59)
Globulin, Total: 2.3 g/dL (ref 1.5–4.5)
Glucose: 92 mg/dL (ref 65–99)
Potassium: 4.5 mmol/L (ref 3.5–5.2)
Sodium: 142 mmol/L (ref 134–144)
Total Protein: 6.5 g/dL (ref 6.0–8.5)

## 2020-10-06 LAB — LIPID PANEL
Chol/HDL Ratio: 3.5 ratio (ref 0.0–5.0)
Cholesterol, Total: 126 mg/dL (ref 100–199)
HDL: 36 mg/dL — ABNORMAL LOW (ref >39)
LDL Chol Calc (NIH): 77 mg/dL (ref 0–99)
Triglycerides: 61 mg/dL (ref 0–149)
VLDL Cholesterol Cal: 13 mg/dL (ref 5–40)

## 2020-10-06 LAB — INSULIN, RANDOM: INSULIN: 10.8 u[IU]/mL (ref 2.6–24.9)

## 2020-10-06 LAB — HEMOGLOBIN A1C
Est. average glucose Bld gHb Est-mCnc: 126 mg/dL
Hgb A1c MFr Bld: 6 % — ABNORMAL HIGH (ref 4.8–5.6)

## 2020-10-17 ENCOUNTER — Other Ambulatory Visit: Payer: Self-pay

## 2020-10-17 ENCOUNTER — Ambulatory Visit (INDEPENDENT_AMBULATORY_CARE_PROVIDER_SITE_OTHER): Payer: Medicare Other | Admitting: Podiatry

## 2020-10-17 ENCOUNTER — Ambulatory Visit: Payer: Medicare Other | Admitting: Orthotics

## 2020-10-17 DIAGNOSIS — L97522 Non-pressure chronic ulcer of other part of left foot with fat layer exposed: Secondary | ICD-10-CM | POA: Diagnosis not present

## 2020-10-17 DIAGNOSIS — E1142 Type 2 diabetes mellitus with diabetic polyneuropathy: Secondary | ICD-10-CM

## 2020-10-17 DIAGNOSIS — M2042 Other hammer toe(s) (acquired), left foot: Secondary | ICD-10-CM

## 2020-10-17 NOTE — Progress Notes (Signed)

## 2020-10-23 ENCOUNTER — Other Ambulatory Visit: Payer: Self-pay | Admitting: Family Medicine

## 2020-10-24 NOTE — Progress Notes (Signed)
Subjective: 74 year old male presents the office for follow-up evaluation of a wound on the left foot submetatarsal 1.  States that the wound is doing better and still applying Prisma to the wound.  Denies any drainage or pus pain swelling or redness.  He has no other concerns today. Denies any fevers, chills, nausea, vomiting.  No calf pain, chest, shortness of breath.   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  Ulceration submetatarsal 1 with hyperkeratotic periwound.  After debridement the wound measures 0.3 x 0.2 x 0.2 cm.  There is no drainage or pus identified today.  There is no surrounding erythema, ascending cellulitis but there is no fluctuation crepitation but there is no malodor.  No obvious signs of infection noted today. Preulcerative hyperkeratotic lesion distal aspect left second toe without any underlying ulceration drainage or signs of infection. Hammertoes are present without any ulcerations today.  No pain with calf compression, swelling, warmth, erythema  Assessment: Ulceration left foot; preulcerative callus  Plan: -All treatment options discussed with the patient including all alternatives, risks, complications.  -Sharply debrided the ulceration with a #312 blade scalpel without any complication to remove nonviable, devitalized tissue down to healthy, wound, granular tissue to promote wound healing. -Wound was cleaned and Prisma was reapplied.   -Monitor for any clinical signs or symptoms of infection and directed to call the office immediately should any occur or go to the ER.  [name]

## 2020-10-25 ENCOUNTER — Ambulatory Visit (INDEPENDENT_AMBULATORY_CARE_PROVIDER_SITE_OTHER): Payer: Medicare Other | Admitting: Adult Health

## 2020-10-25 ENCOUNTER — Encounter (INDEPENDENT_AMBULATORY_CARE_PROVIDER_SITE_OTHER): Payer: Self-pay | Admitting: Adult Health

## 2020-10-25 ENCOUNTER — Other Ambulatory Visit: Payer: Self-pay

## 2020-10-25 VITALS — BP 112/62 | HR 76 | Temp 97.9°F | Ht 69.0 in | Wt 216.0 lb

## 2020-10-25 DIAGNOSIS — E1169 Type 2 diabetes mellitus with other specified complication: Secondary | ICD-10-CM | POA: Diagnosis not present

## 2020-10-25 DIAGNOSIS — Z6832 Body mass index (BMI) 32.0-32.9, adult: Secondary | ICD-10-CM | POA: Diagnosis not present

## 2020-10-25 DIAGNOSIS — E118 Type 2 diabetes mellitus with unspecified complications: Secondary | ICD-10-CM

## 2020-10-25 DIAGNOSIS — E785 Hyperlipidemia, unspecified: Secondary | ICD-10-CM

## 2020-10-25 DIAGNOSIS — F3289 Other specified depressive episodes: Secondary | ICD-10-CM | POA: Diagnosis not present

## 2020-10-25 DIAGNOSIS — E669 Obesity, unspecified: Secondary | ICD-10-CM

## 2020-10-25 MED ORDER — BUPROPION HCL ER (SR) 100 MG PO TB12: 100.0000 mg | ORAL_TABLET | Freq: Every day | ORAL | 0 refills | Status: DC

## 2020-10-25 NOTE — Progress Notes (Signed)
Chief Complaint:   OBESITY [name] is here to discuss his progress with his obesity treatment plan along with follow-up of his obesity related diagnoses. Dustin is on the Category 3 Plan and states he is following his eating plan approximately 60% of the time. Kayen states he is exercising 0 minutes 0 times per week.  Today's visit was #: 54 Starting weight: 236 lbs Starting date: 08/25/2018 Today's weight: 216 lbs Today's date: 10/25/2020 Total lbs lost to date: 20 Total lbs lost since last in-office visit: 3  Interim History: Petra' wife had right total knee replacement 10/06/2020 and he has been assisting with her recovery. He has been getting in extra steps/exercise due to assisting with her recovery.  We discussed the risks/benefits of COVID-19 vaccine and he again defers vaccination at this time.  Subjective:   Type 2 diabetes mellitus with complications ([place]). 10/05/2020 blood glucose 92, A1c 6.0 with an insulin level of 10.8. A1c is at goal. CMP on 10/05/2020 was stable. Ashtyn is on metformin 1000 mg daily and glimepiride 2 mg BID.   Lab Results  Component Value Date   HGBA1C 6.0 (H) 10/05/2020   HGBA1C 6.5 (H) 05/23/2020   HGBA1C 6.4 02/23/2020   Lab Results  Component Value Date   MICROALBUR 0.6 12/12/2012   LDLCALC 77 10/05/2020   CREATININE 0.88 10/05/2020   Lab Results  Component Value Date   INSULIN 10.8 10/05/2020   INSULIN 16.7 05/23/2020   INSULIN 15.3 12/15/2018   INSULIN 29.0 (H) 08/25/2018   Hyperlipidemia associated with type 2 diabetes mellitus ([place]). Lipid panel on 10/05/2020 showed LDL improved at 77, slightly above goal of <70 for diabetics. Issaiah is on atorvastatin 80 mg daily and denies myalgias.   Lab Results  Component Value Date   CHOL 126 10/05/2020   HDL 36 (L) 10/05/2020   LDLCALC 77 10/05/2020   LDLDIRECT 104.9 12/12/2012   TRIG 61 10/05/2020   CHOLHDL 3.5 10/05/2020   Lab Results  Component Value Date   ALT 28  10/05/2020   AST 20 10/05/2020   ALKPHOS 103 10/05/2020   BILITOT 0.3 10/05/2020   The ASCVD Risk score [name] DC Jr., et al., 2013) failed to calculate for the following reasons:   The valid total cholesterol range is 130 to 320 mg/dL  Other depression,with emotional eating. Whitaker is struggling with emotional eating and using food for comfort to the extent that it is negatively impacting his health. He has been working on behavior modification techniques to help reduce his emotional eating and has been somewhat successful. He shows no sign of suicidal or homicidal ideations. Blood pressure and heart rate are excellent on today's office visit. Jaxton denies history of seizure. He reports good craving control on bupropion SR 100 mg daily.  Assessment/Plan:   Type 2 diabetes mellitus with complications ([place]). Good blood sugar control is important to decrease the likelihood of diabetic complications such as nephropathy, neuropathy, limb loss, blindness, coronary artery disease, and death. Intensive lifestyle modification including diet, exercise and weight loss are the first line of treatment for diabetes. Elizer will follow-up with his PCP and discuss glimepiride prescription. He verbalized understanding/agreement.  Hyperlipidemia associated with type 2 diabetes mellitus ([place]). Cardiovascular risk and specific lipid/LDL goals reviewed.  We discussed several lifestyle modifications today and Jhamal will continue to work on diet, exercise and weight loss efforts. Orders and follow up as documented in patient record. Thadd will continue statin therapy as directed.  Counseling Intensive lifestyle modifications are the first line treatment for this issue. . Dietary changes: Increase soluble fiber. Decrease simple carbohydrates. . Exercise changes: Moderate to vigorous-intensity aerobic activity 150 minutes per week if tolerated.  . Lipid-lowering medications: see documented in medical record.  Other  depression,with emotional eating. Behavior modification techniques were discussed today to help Tochukwu deal with his emotional/non-hunger eating behaviors.  Orders and follow up as documented in patient record. Refill was given for buPROPion (WELLBUTRIN SR) 100 MG 12 hr tablet daily #30 with 0 refills.  Class 1 obesity with serious comorbidity and body mass index (BMI) of 32.0 to 32.9 in adult, unspecified obesity type.    Leanord is currently in the action stage of change. As such, his goal is to continue with weight loss efforts. He has agreed to the Category 3 Plan.   Handouts were provided on Holiday Recipes and Holiday Strategies.  Exercise goals: No exercise has been prescribed at this time.  Behavioral modification strategies: increasing lean protein intake, decreasing simple carbohydrates, meal planning and cooking strategies, better snacking choices and planning for success.  Lorenz has agreed to follow-up with our clinic in 3 weeks. He was informed of the importance of frequent follow-up visits to maximize his success with intensive lifestyle modifications for his multiple health conditions.   Objective:   Blood pressure 112/62, pulse 76, temperature 97.9 F (36.6 C), height 5\' 9"  (1.753 m), weight 216 lb (98 kg), SpO2 96 %. Body mass index is 31.9 kg/m.  General: Cooperative, alert, well developed, in no acute distress. HEENT: Conjunctivae and lids unremarkable. Cardiovascular: Regular rhythm.  Lungs: Normal work of breathing. Neurologic: No focal deficits.   Lab Results  Component Value Date   CREATININE 0.88 10/05/2020   BUN 17 10/05/2020   NA 142 10/05/2020   K 4.5 10/05/2020   CL 104 10/05/2020   CO2 26 10/05/2020   Lab Results  Component Value Date   ALT 28 10/05/2020   AST 20 10/05/2020   ALKPHOS 103 10/05/2020   BILITOT 0.3 10/05/2020   Lab Results  Component Value Date   HGBA1C 6.0 (H) 10/05/2020   HGBA1C 6.5 (H) 05/23/2020   HGBA1C 6.4 02/23/2020    HGBA1C 6.5 (H) 12/06/2019   HGBA1C 5.6 07/01/2019   Lab Results  Component Value Date   INSULIN 10.8 10/05/2020   INSULIN 16.7 05/23/2020   INSULIN 15.3 12/15/2018   INSULIN 29.0 (H) 08/25/2018   Lab Results  Component Value Date   TSH 3.19 02/23/2020   Lab Results  Component Value Date   CHOL 126 10/05/2020   HDL 36 (L) 10/05/2020   LDLCALC 77 10/05/2020   LDLDIRECT 104.9 12/12/2012   TRIG 61 10/05/2020   CHOLHDL 3.5 10/05/2020   Lab Results  Component Value Date   WBC 7.1 02/23/2020   HGB 13.0 02/23/2020   HCT 39.7 02/23/2020   MCV 94.4 02/23/2020   PLT 211.0 02/23/2020   Lab Results  Component Value Date   FERRITIN 311 12/04/2019   Obesity Behavioral Intervention:   Approximately 15 minutes were spent on the discussion below.  ASK: We discussed the diagnosis of obesity with [name] today and Traysen agreed to give [name] permission to discuss obesity behavioral modification therapy today.  ASSESS: Berlon has the diagnosis of obesity and his BMI today is 32.4. Jaqai is in the action stage of change.   ADVISE: Zaidin was educated on the multiple health risks of obesity as well as the benefit of weight loss  to improve his health. He was advised of the need for long term treatment and the importance of lifestyle modifications to improve his current health and to decrease his risk of future health problems.  AGREE: Multiple dietary modification options and treatment options were discussed and Nyxon agreed to follow the recommendations documented in the above note.  ARRANGE: Kayla was educated on the importance of frequent visits to treat obesity as outlined per CMS and USPSTF guidelines and agreed to schedule his next follow up appointment today.  Attestation Statements:   Reviewed by clinician on day of visit: allergies, medications, problem list, medical history, surgical history, family history, social history, and previous encounter notes.  I, Michaelene Song, am acting  as Location manager for [employer], NP-C   I have reviewed the above documentation for accuracy and completeness, and I agree with the above. -  Vic Esco d. [employer], NP-C

## 2020-11-01 ENCOUNTER — Ambulatory Visit: Payer: Medicare Other | Admitting: Podiatry

## 2020-11-01 ENCOUNTER — Other Ambulatory Visit: Payer: Self-pay

## 2020-11-01 DIAGNOSIS — L97522 Non-pressure chronic ulcer of other part of left foot with fat layer exposed: Secondary | ICD-10-CM | POA: Diagnosis not present

## 2020-11-01 DIAGNOSIS — E1142 Type 2 diabetes mellitus with diabetic polyneuropathy: Secondary | ICD-10-CM

## 2020-11-04 NOTE — Progress Notes (Signed)
Subjective: 74 year old male presents the office for follow-up evaluation of a wound on the left foot submetatarsal 1.  States that there is improvement and the wound is getting smaller not seen significant drainage.  Denies any fevers, chills, nausea, vomiting.  No calf pain, chest, shortness of breath.   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  Ulceration submetatarsal 1 with hyperkeratotic periwound.  After debridement the wound measures the same size at 0.3 x 0.2 x 0.2 cm.  There is no drainage or pus identified today.  There is no surrounding erythema, ascending cellulitis but there is no fluctuation crepitation but there is no malodor.  No obvious signs of infection noted today. Hammertoes are present without any ulcerations today.  No pain with calf compression, swelling, warmth, erythema  Assessment: Ulceration left foot; preulcerative callus  Plan: -All treatment options discussed with the patient including all alternatives, risks, complications.  -Sharply debrided the ulceration with a #312 blade scalpel without any complication to remove nonviable, devitalized tissue down to healthy, wound, granular tissue to promote wound healing.  The wound is old Prisma in the wound.  This is delaying the healing.  We will switch to using iodosorb.  -Offloading -Monitor for any clinical signs or symptoms of infection and directed to call the office immediately should any occur or go to the ER.  Vivi Barrack DPM

## 2020-11-11 ENCOUNTER — Other Ambulatory Visit: Payer: Self-pay

## 2020-11-11 ENCOUNTER — Telehealth (INDEPENDENT_AMBULATORY_CARE_PROVIDER_SITE_OTHER): Payer: Medicare Other | Admitting: Family Medicine

## 2020-11-11 DIAGNOSIS — E6609 Other obesity due to excess calories: Secondary | ICD-10-CM

## 2020-11-11 DIAGNOSIS — L97529 Non-pressure chronic ulcer of other part of left foot with unspecified severity: Secondary | ICD-10-CM

## 2020-11-11 DIAGNOSIS — E1149 Type 2 diabetes mellitus with other diabetic neurological complication: Secondary | ICD-10-CM

## 2020-11-11 DIAGNOSIS — N289 Disorder of kidney and ureter, unspecified: Secondary | ICD-10-CM | POA: Diagnosis not present

## 2020-11-11 DIAGNOSIS — E1159 Type 2 diabetes mellitus with other circulatory complications: Secondary | ICD-10-CM | POA: Diagnosis not present

## 2020-11-11 DIAGNOSIS — Z8616 Personal history of COVID-19: Secondary | ICD-10-CM | POA: Diagnosis not present

## 2020-11-11 DIAGNOSIS — Z6832 Body mass index (BMI) 32.0-32.9, adult: Secondary | ICD-10-CM

## 2020-11-11 DIAGNOSIS — I152 Hypertension secondary to endocrine disorders: Secondary | ICD-10-CM | POA: Diagnosis not present
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
## 2020-11-13 NOTE — Assessment & Plan Note (Signed)
hgba1c acceptable, minimize simple carbs. Increase exercise as tolerated. Continue current meds 

## 2020-11-13 NOTE — Assessment & Plan Note (Signed)
He has chosen not to get the covid vaccine despite the fact that he is at high risk for complications if he gets covid and his immunity will have waned a good deal by now. He is aware.

## 2020-11-13 NOTE — Assessment & Plan Note (Signed)
Hydrate and monitor 

## 2020-11-13 NOTE — Assessment & Plan Note (Signed)
He is working with [facility] and he reports he is doing well.

## 2020-11-13 NOTE — Assessment & Plan Note (Signed)
Monitor and report any concerns, no changes to meds. Encouraged heart healthy diet such as the DASH diet and exercise as tolerated.  ?

## 2020-11-13 NOTE — Assessment & Plan Note (Signed)
He is working with Dr Jacqualyn Posey of [facility] and is slowly healing.

## 2020-11-14 ENCOUNTER — Ambulatory Visit: Payer: Medicare Other | Admitting: Family Medicine

## 2020-11-15 ENCOUNTER — Encounter (INDEPENDENT_AMBULATORY_CARE_PROVIDER_SITE_OTHER): Payer: Self-pay | Admitting: Adult Health

## 2020-11-15 ENCOUNTER — Other Ambulatory Visit: Payer: Self-pay

## 2020-11-15 ENCOUNTER — Encounter: Payer: Medicare Other | Admitting: Podiatry

## 2020-11-15 ENCOUNTER — Ambulatory Visit (INDEPENDENT_AMBULATORY_CARE_PROVIDER_SITE_OTHER): Payer: Medicare Other | Admitting: Adult Health

## 2020-11-15 VITALS — BP 130/72 | HR 67 | Temp 97.4°F | Ht 69.0 in | Wt 217.0 lb

## 2020-11-15 DIAGNOSIS — I152 Hypertension secondary to endocrine disorders: Secondary | ICD-10-CM | POA: Diagnosis not present

## 2020-11-15 DIAGNOSIS — E1159 Type 2 diabetes mellitus with other circulatory complications: Secondary | ICD-10-CM

## 2020-11-15 DIAGNOSIS — F3289 Other specified depressive episodes: Secondary | ICD-10-CM

## 2020-11-15 DIAGNOSIS — Z6832 Body mass index (BMI) 32.0-32.9, adult: Secondary | ICD-10-CM | POA: Diagnosis not present

## 2020-11-15 DIAGNOSIS — E6609 Other obesity due to excess calories: Secondary | ICD-10-CM | POA: Diagnosis not present

## 2020-11-15 DIAGNOSIS — E118 Type 2 diabetes mellitus with unspecified complications: Secondary | ICD-10-CM

## 2020-11-16 NOTE — Progress Notes (Signed)
Chief Complaint:   OBESITY [name] is here to discuss his progress with his obesity treatment plan along with follow-up of his obesity related diagnoses. [name] is on the Category 3 Plan and states he is following his eating plan approximately 70% of the time. [name] states he is exercising 0 minutes 0 times per week.  Today's visit was #: 17 Starting weight: 236 lbs Starting date: 08/25/2018 Today's weight: 217 lbs Today's date: 11/15/2020 Total lbs lost to date: 19 lbs Total lbs lost since last in-office visit: 0  Interim History: [name]' podiatrist has recently removed his surgical shoe and he can now wear regular foot wear! He was seen by his PCP on 11/11/2020-they discussed stopping Amaryl, continuing Metformin. He has been off Amaryl since 11/12/2020. Fasting ambulatory blood sugar 120-140's.  Subjective:   1. Hypertension associated with type 2 diabetes mellitus ([name]) Blood pressure and heart rate excellent in the OV today. He denies cardiac symptoms. He is on losartan 50 mg daily, Amlodipine 5 mg daily, and carvedilol 25 mg 1/2 tab BID.   BP Readings from Last 3 Encounters:  11/15/20 130/72  10/25/20 112/62  10/05/20 136/66    2. Type 2 diabetes mellitus with complications ([name]) 04/07/1496 PCP visit- discussed stopping Amaryl and continuing Metformin. Ambulatory fasting blood sugars 120-140. [name] denies episodes of hypoglycemia. He has been off Amaryl since 11/12/2020.  Lab Results  Component Value Date   HGBA1C 6.0 (H) 10/05/2020   HGBA1C 6.5 (H) 05/23/2020   HGBA1C 6.4 02/23/2020   Lab Results  Component Value Date   MICROALBUR 0.6 12/12/2012   LDLCALC 77 10/05/2020   CREATININE 0.88 10/05/2020   Lab Results  Component Value Date   INSULIN 10.8 10/05/2020   INSULIN 16.7 05/23/2020   INSULIN 15.3 12/15/2018   INSULIN 29.0 (H) 08/25/2018    3. Other depression,with emotional eating Blood pressure and heart rate are excellent at the office today. He reports  excellent control of cravings. She is on Wellbutrin SR 100 mg daily. He denies history of seizures.    Assessment/Plan:   1. Hypertension associated with type 2 diabetes mellitus ([name]) [name] is working on healthy weight loss and exercise to improve blood pressure control. We will watch for signs of hypotension as he continues his lifestyle modifications. Continue current anti-hypertensive regimen.  2. Type 2 diabetes mellitus with complications ([name]) Good blood sugar control is important to decrease the likelihood of diabetic complications such as nephropathy, neuropathy, limb loss, blindness, coronary artery disease, and death. Intensive lifestyle modification including diet, exercise and weight loss are the first line of treatment for diabetes. Closely monitor fasting blood sugar. Goal is <150.  3. Other depression,with emotional eating Behavior modification techniques were discussed today to help [name] deal with his emotional/non-hunger eating behaviors.  Orders and follow up as documented in patient record. Refill Wellbutrin SR 100 mg.  4. Class 1 obesity due to excess calories with serious comorbidity and body mass index (BMI) of 32.0 to 32.9 in adult [name] is currently in the action stage of change. As such, his goal is to continue with weight loss efforts. He has agreed to the Category 3 Plan.   Handout: Multiple recipes  Exercise goals: No exercise has been prescribed at this time.  Behavioral modification strategies: increasing lean protein intake, decreasing simple carbohydrates, no skipping meals, meal planning and cooking strategies and planning for success.  [name] has agreed to follow-up with our clinic in 2 weeks. He was informed of the  importance of frequent follow-up visits to maximize his success with intensive lifestyle modifications for his multiple health conditions.   Objective:   Blood pressure 130/72, pulse 67, temperature (!) 97.4 F (36.3 C), height 5\' 9"  (1.753  m), weight 217 lb (98.4 kg), SpO2 96 %. Body mass index is 32.05 kg/m.  General: Cooperative, alert, well developed, in no acute distress. HEENT: Conjunctivae and lids unremarkable. Cardiovascular: Regular rhythm.  Lungs: Normal work of breathing. Neurologic: No focal deficits.   Lab Results  Component Value Date   CREATININE 0.88 10/05/2020   BUN 17 10/05/2020   NA 142 10/05/2020   K 4.5 10/05/2020   CL 104 10/05/2020   CO2 26 10/05/2020   Lab Results  Component Value Date   ALT 28 10/05/2020   AST 20 10/05/2020   ALKPHOS 103 10/05/2020   BILITOT 0.3 10/05/2020   Lab Results  Component Value Date   HGBA1C 6.0 (H) 10/05/2020   HGBA1C 6.5 (H) 05/23/2020   HGBA1C 6.4 02/23/2020   HGBA1C 6.5 (H) 12/06/2019   HGBA1C 5.6 07/01/2019   Lab Results  Component Value Date   INSULIN 10.8 10/05/2020   INSULIN 16.7 05/23/2020   INSULIN 15.3 12/15/2018   INSULIN 29.0 (H) 08/25/2018   Lab Results  Component Value Date   TSH 3.19 02/23/2020   Lab Results  Component Value Date   CHOL 126 10/05/2020   HDL 36 (L) 10/05/2020   LDLCALC 77 10/05/2020   LDLDIRECT 104.9 12/12/2012   TRIG 61 10/05/2020   CHOLHDL 3.5 10/05/2020   Lab Results  Component Value Date   WBC 7.1 02/23/2020   HGB 13.0 02/23/2020   HCT 39.7 02/23/2020   MCV 94.4 02/23/2020   PLT 211.0 02/23/2020   Lab Results  Component Value Date   FERRITIN 311 12/04/2019    Obesity Behavioral Intervention:   Approximately 15 minutes were spent on the discussion below.  ASK: We discussed the diagnosis of obesity with [name] today and [name] agreed to give [name] permission to discuss obesity behavioral modification therapy today.  ASSESS: [name] has the diagnosis of obesity and his BMI today is 32.1. [name] is in the action stage of change.   ADVISE: [name] was educated on the multiple health risks of obesity as well as the benefit of weight loss to improve his health. He was advised of the need for long term  treatment and the importance of lifestyle modifications to improve his current health and to decrease his risk of future health problems.  AGREE: Multiple dietary modification options and treatment options were discussed and [name] agreed to follow the recommendations documented in the above note.  ARRANGE: [name] was educated on the importance of frequent visits to treat obesity as outlined per CMS and USPSTF guidelines and agreed to schedule his next follow up appointment today.  Attestation Statements:   Reviewed by clinician on day of visit: allergies, medications, problem list, medical history, surgical history, family history, social history, and previous encounter notes.  Coral Ceo, am acting as Location manager for Mina Marble, NP.  I have reviewed the above documentation for accuracy and completeness, and I agree with the above. -  Reis Goga d. Kynli Chou, NP-C

## 2020-11-18 ENCOUNTER — Telehealth: Payer: Self-pay | Admitting: Podiatry

## 2020-11-18 NOTE — Telephone Encounter (Signed)
Patient has been rescheduled for wound care check for 745 11/22/20

## 2020-11-18 NOTE — Telephone Encounter (Signed)
See if they can do 7:45 on Tuesday morning

## 2020-11-18 NOTE — Telephone Encounter (Signed)
Patients wife called inquiring about upcoming appointment, stated they requested morning appointment for a Tuesday and they will not be able to make appointment at 1pm. There isn't anything available for patient at the moment but patients wife stated they were told they could be worked in for their preference,Please Advise

## 2020-11-22 ENCOUNTER — Ambulatory Visit (INDEPENDENT_AMBULATORY_CARE_PROVIDER_SITE_OTHER): Payer: Medicare Other | Admitting: Podiatry

## 2020-11-22 ENCOUNTER — Other Ambulatory Visit: Payer: Self-pay

## 2020-11-22 DIAGNOSIS — L97522 Non-pressure chronic ulcer of other part of left foot with fat layer exposed: Secondary | ICD-10-CM | POA: Diagnosis not present

## 2020-11-23 ENCOUNTER — Encounter: Payer: Self-pay | Admitting: Cardiovascular Disease

## 2020-11-23 ENCOUNTER — Ambulatory Visit: Payer: Medicare Other | Admitting: Cardiovascular Disease

## 2020-11-23 DIAGNOSIS — I739 Peripheral vascular disease, unspecified: Secondary | ICD-10-CM

## 2020-11-23 DIAGNOSIS — I714 Abdominal aortic aneurysm, without rupture, unspecified: Secondary | ICD-10-CM

## 2020-11-23 DIAGNOSIS — I152 Hypertension secondary to endocrine disorders: Secondary | ICD-10-CM

## 2020-11-23 DIAGNOSIS — E1159 Type 2 diabetes mellitus with other circulatory complications: Secondary | ICD-10-CM

## 2020-11-23 NOTE — Assessment & Plan Note (Signed)
History of essential hypertension a blood pressure measured today at 136/68.  He is on amlodipine, carvedilol and losartan.

## 2020-11-23 NOTE — Assessment & Plan Note (Signed)
History of hyperlipidemia on statin therapy with lipid profile performed 12/07/2019 revealing total cholesterol of 126, LDL 77 and HDL 36.

## 2020-11-23 NOTE — Progress Notes (Signed)
error 

## 2020-11-23 NOTE — Progress Notes (Signed)
11/23/2020 [name]   1946/04/22  [id number]  Primary Physician [name], MD Primary Cardiologist: Lorretta Harp MD FACP, [place], [place], [place]  HPI:  [name] is a 75 y.o.  moderately overweight married Caucasian male father of 16, grandfather of 6 grandchildren who I last saw in the office 10/28/2018. He was referred by [name] for peripheral vascular evaluation because of a recent CTA that showed a 4 cm abdominal aortic aneurysm and left iliac stenosis.  He does have a history of 60-pack-year tobacco abuse having quit 2008.  He also has treated hypertension, diabetes and hyperlipidemia.  He had a stroke back in 2007 but is never had a heart attack.  Does complain of some dyspnea probably related to COPD but denies chest pain.  He has a known abdominal aortic aneurysm, renal artery stenosis and left iliac stenosis.  Abdominal ultrasound performed 10/01/2016 did show a moderately severe left common iliac artery stenosis with a aortic dimension of 3.5 x 3.7 cm.  Recent CTA performed 09/18/2018 revealed an aortic dimension of 3.7 x 4 cm with what appeared to be a left common iliac artery stenosis.  Does have some left lower extremity discomfort with exercise consistent with claudication but this does not appear to be lifestyle limiting at this time.  Since I saw him he was admitted January 2021 with COVID-pneumonia which she recovered from.  He also had critical limb ischemia and underwent left great toe amputation with "kissing balloon" stenting using VBX stents by Dr. Carlis Abbott 05/28/2019.  His most recent Doppler studies performed 01/05/2020 revealed these to be widely patent.  He denies chest pain, shortness of breath or claudication.   Current Meds  Medication Sig  . ALPRAZolam (XANAX) 0.25 MG tablet Take 1 tablet (0.25 mg total) by mouth 2 (two) times daily as needed for anxiety.  [name] amLODipine (NORVASC) 5 MG tablet TAKE 1 TABLET BY MOUTH  DAILY  . Apremilast (OTEZLA) 30 MG TABS  Take 30 mg by mouth 2 (two) times daily.  . Ascorbic Acid (VITAMIN C) 1000 MG tablet Take 1,000 mg by mouth daily.  [name] aspirin EC 81 MG tablet Take 81 mg by mouth at bedtime.  [name] atorvastatin (LIPITOR) 80 MG tablet TAKE 1 TABLET BY MOUTH  EVERY DAY AT 6PM  . buPROPion (WELLBUTRIN SR) 100 MG 12 hr tablet Take 1 tablet (100 mg total) by mouth daily.  . carvedilol (COREG) 25 MG tablet Take 0.5 tablets (12.5 mg total) by mouth 2 (two) times daily with a meal.  . cholecalciferol (VITAMIN D) 1000 UNITS tablet Take 1,000 Units by mouth daily.  . clopidogrel (PLAVIX) 75 MG tablet Take 1 tablet (75 mg total) by mouth daily with breakfast.  . dorzolamide-timolol (COSOPT) 22.3-6.8 MG/ML ophthalmic solution Place 1 drop into both eyes 2 (two) times daily.   [name] HYDROcodone-acetaminophen (NORCO) 10-325 MG tablet SMARTSIG:0.5-1 Tablet(s) By Mouth Every 12 Hours PRN  . Latanoprost 0.005 % EMUL Place 1 drop into both eyes in the morning and at bedtime.  [name] losartan (COZAAR) 50 MG tablet Take 1 tablet (50 mg total) by mouth daily.  . Magnesium 500 MG TABS Take 500 mg by mouth daily.  . Multiple Vitamin (MULTIVITAMIN) tablet Take 1 tablet by mouth daily.  . Omega-3 Fatty Acids (FISH OIL) 1000 MG CAPS Take 1,000 mg by mouth 2 (two) times daily.  . pregabalin (LYRICA) 150 MG capsule Take 1 capsule (150 mg total) by mouth 2 (two) times  daily.  [name] PROAIR HFA 108 (90 Base) MCG/ACT inhaler USE 2 PUFFS EVERY 6 HOURS  AS NEEDED FOR WHEEZING OR  SHORTNESS OF BREATH  . sertraline (ZOLOFT) 50 MG tablet Take 1 tablet (50 mg total) by mouth daily.  . vitamin B-12 (CYANOCOBALAMIN) 1000 MCG tablet Take 1,000 mcg by mouth 2 (two) times a week. Monday and Thursday  . zinc gluconate 50 MG tablet Take 50 mg by mouth daily.     Allergies  Allergen Reactions  . No Known Allergies     Social History   Socioeconomic History  . Marital status: Married    Spouse name: [name]  . Number of children: 3  . Years of education: 12th  grade  . Highest education level: Not on file  Occupational History  . Occupation: Maintenance    Comment: Primary Care at [employer]  Tobacco Use  . Smoking status: Former Smoker    Packs/day: 2.00    Years: 40.00    Pack years: 80.00    Types: Cigarettes, Cigars    Quit date: 05/04/2006    Years since quitting: 14.5  . Smokeless tobacco: Never Used  Vaping Use  . Vaping Use: Never used  Substance and Sexual Activity  . Alcohol use: Not Currently    Alcohol/week: 0.0 standard drinks    Comment: rare - 11/10/2012 "quit > 20 yr ago"  . Drug use: No  . Sexual activity: Not Currently  Other Topics Concern  . Not on file  Social History Narrative   Lives with wife (1993), no pets   Grown children.   Occupation: retired, Games developer, [employer])   Activity: golf, gardening    Diet: good water, fruits/vegetables daily   Social Determinants of Radio broadcast assistant Strain: Not on file  Food Insecurity: Not on file  Transportation Needs: Not on file  Physical Activity: Not on file  Stress: Not on file  Social Connections: Not on file  Intimate Partner Violence: Not on file     Review of Systems: General: negative for chills, fever, night sweats or weight changes.  Cardiovascular: negative for chest pain, dyspnea on exertion, edema, orthopnea, palpitations, paroxysmal nocturnal dyspnea or shortness of breath Dermatological: negative for rash Respiratory: negative for cough or wheezing Urologic: negative for hematuria Abdominal: negative for nausea, vomiting, diarrhea, bright red blood per rectum, melena, or hematemesis Neurologic: negative for visual changes, syncope, or dizziness All other systems reviewed and are otherwise negative except as noted above.    Blood pressure 136/68, pulse 68, height 5\' 9"  (1.753 m), weight 223 lb (101.2 kg).  General appearance: alert and no distress Neck: no adenopathy, no JVD, supple, symmetrical, trachea midline,  thyroid not enlarged, symmetric, no tenderness/mass/nodules and Soft right carotid bruit Lungs: clear to auscultation bilaterally Heart: regular rate and rhythm, S1, S2 normal, no murmur, click, rub or gallop Extremities: extremities normal, atraumatic, no cyanosis or edema Pulses: 2+ and symmetric Skin: Skin color, texture, turgor normal. No rashes or lesions Neurologic: Alert and oriented X 3, normal strength and tone. Normal symmetric reflexes. Normal coordination and gait  EKG sinus rhythm at 68 with right bundle branch block.  I personally reviewed this EKG.  ASSESSMENT AND PLAN:   PVD (peripheral vascular disease) [facility]) Mr. Bowdish had critical ischemia with a gangrenous left great toe.  He underwent amputation and bilateral iliac artery VBX kissing stenting by Dr. Carlis Abbott 05/28/2019.  His wound ultimately healed.  His most recent lower extremity arterial Doppler studies  performed 01/05/2020 revealed patent stents.  Hyperlipidemia History of hyperlipidemia on statin therapy with lipid profile performed 12/07/2019 revealing total cholesterol of 126, LDL 77 and HDL 36.  Hypertension associated with type 2 diabetes mellitus ([place]) History of essential hypertension a blood pressure measured today at 136/68.  He is on amlodipine, carvedilol and losartan.      Lorretta Harp MD FACP,FACC,FAHA, [facility] 11/23/2020 9:33 AM

## 2020-11-23 NOTE — Assessment & Plan Note (Signed)
[name] had critical ischemia with a gangrenous left great toe.  He underwent amputation and bilateral iliac artery VBX kissing stenting by Dr. Carlis Abbott 05/28/2019.  His wound ultimately healed.  His most recent lower extremity arterial Doppler studies performed 01/05/2020 revealed patent stents.

## 2020-11-23 NOTE — Progress Notes (Signed)
Subjective: 75 year old male presents the office for follow-up evaluation of a wound on the left foot submetatarsal 1.  States the wound is continued to improve.  Denies injury pus or any swelling or redness.  No pain.   He is continue with iodosorb dressing changes. Denies any fevers, chills, nausea, vomiting.  No calf pain, chest, shortness of breath.   Objective: AAO x3, NAD- wearing a regular shoe DP/PT pulses palpable bilaterally, CRT less than 3 seconds Previous hallux amputation of the left side which is well-healed.  Ulceration submetatarsal 1 with hyperkeratotic periwound.  After debridement the wound measures the same size at 0.5 x 0.4 x 0.2 cm.  There is no drainage or pus identified today.  There is no surrounding erythema, ascending cellulitis but there is no fluctuation crepitation but there is no malodor.  No obvious signs of infection noted today. Hammertoes are present without any ulcerations today.  No pain with calf compression, swelling, warmth, erythema  Assessment: Ulceration left foot  Plan: -All treatment options discussed with the patient including all alternatives, risks, complications.  -Sharply debrided the ulceration with a #312 blade scalpel without any complication to remove nonviable, devitalized tissue down to healthy, wound, granular tissue to promote wound healing.  No significant fat tissue today was a debrided.  Recommend switch back to using Prisma she was doing better with this. -Offloading -Monitor for any clinical signs or symptoms of infection and directed to call the office immediately should any occur or go to the ER.  [name]

## 2020-11-23 NOTE — Patient Instructions (Signed)
Medication Instructions:  Your physician recommends that you continue on your current medications as directed. Please refer to the Current Medication list given to you today.  *If you need a refill on your cardiac medications before your next appointment, please call your pharmacy*  Testing/Procedures: Your physician has requested that you have an aorto-iliac duplex due in March. During this test, an ultrasound is used to evaluate the aorta and iliac arteries. Do not eat after midnight the day before and avoid carbonated beverages  Follow-Up: At [facility], you and your health needs are our priority.  As part of our continuing mission to provide you with exceptional heart care, we have created designated Provider Care Teams.  These Care Teams include your primary Cardiologist (physician) and Advanced Practice Providers (APPs -  Physician Assistants and Nurse Practitioners) who all work together to provide you with the care you need, when you need it.  We recommend signing up for the patient portal called "MyChart".  Sign up information is provided on this After Visit Summary.  MyChart is used to connect with patients for Virtual Visits (Telemedicine).  Patients are able to view lab/test results, encounter notes, upcoming appointments, etc.  Non-urgent messages can be sent to your provider as well.   To learn more about what you can do with MyChart, go to [email or web address].    Your next appointment:   12 month(s)  The format for your next appointment:   In Person  Provider:   Quay Burow, MD

## 2020-11-23 NOTE — Assessment & Plan Note (Signed)
Measured 4.3 cm by recent duplex ultrasound performed 01/05/2020.  This will be repeated on annual basis.

## 2020-11-29 ENCOUNTER — Ambulatory Visit (INDEPENDENT_AMBULATORY_CARE_PROVIDER_SITE_OTHER): Payer: Medicare Other | Admitting: Adult Health

## 2020-11-29 ENCOUNTER — Encounter (INDEPENDENT_AMBULATORY_CARE_PROVIDER_SITE_OTHER): Payer: Self-pay | Admitting: Adult Health

## 2020-11-29 ENCOUNTER — Other Ambulatory Visit: Payer: Self-pay

## 2020-11-29 VITALS — BP 141/74 | HR 77 | Temp 98.0°F | Ht 69.0 in | Wt 214.0 lb

## 2020-11-29 DIAGNOSIS — I152 Hypertension secondary to endocrine disorders: Secondary | ICD-10-CM | POA: Diagnosis not present

## 2020-11-29 DIAGNOSIS — F3289 Other specified depressive episodes: Secondary | ICD-10-CM

## 2020-11-29 DIAGNOSIS — Z6831 Body mass index (BMI) 31.0-31.9, adult: Secondary | ICD-10-CM | POA: Diagnosis not present

## 2020-11-29 DIAGNOSIS — E1169 Type 2 diabetes mellitus with other specified complication: Secondary | ICD-10-CM | POA: Diagnosis not present

## 2020-11-29 DIAGNOSIS — E6609 Other obesity due to excess calories: Secondary | ICD-10-CM

## 2020-11-29 DIAGNOSIS — E1159 Type 2 diabetes mellitus with other circulatory complications: Secondary | ICD-10-CM | POA: Diagnosis not present

## 2020-11-29 MED ORDER — BUPROPION HCL ER (SR) 100 MG PO TB12: 100.0000 mg | ORAL_TABLET | Freq: Every day | ORAL | 0 refills | Status: DC

## 2020-11-29 NOTE — Progress Notes (Signed)
Chief Complaint:   OBESITY [name] is here to discuss his progress with his obesity treatment plan along with follow-up of his obesity related diagnoses. [name] is on the Category 3 Plan and states he is following his eating plan approximately 70% of the time. [name] states he is walking 3,000 steps 4 times per week.  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 He has a follow up (12/13/20) with Dr. Sula Rumple regarding left foot submetatarsal wound. He was seen by Dr. Priscille Heidelberg 11/23/2020- No change to anti-hypertensives or statin therapy. His wife continues to recover for R total knee replacement- she is able to safely ambulate with a cane and has been cleared to drive.  Subjective:   1. Hypertension associated with type 2 diabetes mellitus ([place]) Pt was recently seen by Dr. Priscille Heidelberg 11/23/2020. No change to anti-hypertensives and EKG was completed. EKG detected normal sinus rhythm with RBBB. Pt denies acute cardiac symptoms. SBP slightly elevated at OV.  He wife monitors his BP at home, he is unable to report home readings.  BP Readings from Last 3 Encounters:  11/29/20 (!) 141/74  11/23/20 136/68  11/15/20 130/72    2. Type 2 diabetes mellitus with other specified complication, without long-term current use of insulin (HCC) A1c on 10/05/2020 was 6.0-at goal. Pt is on Metformin 1000 mg in the evening. He denies GI upset. PCP/Dr. Randel Pigg is managing diabetes. Metformin is missing from medication list, however, pt reports taking it daily. Ambulatory fasting blood sugars 110-130's.  He has remained off Amaryl per directions of PCP.  Lab Results  Component Value Date   HGBA1C  6.0 (H) 10/05/2020   HGBA1C 6.5 (H) 05/23/2020   HGBA1C 6.4 02/23/2020   Lab Results  Component Value Date   MICROALBUR 0.6 12/12/2012   LDLCALC 77 10/05/2020   CREATININE 0.88 10/05/2020   Lab Results  Component Value Date   INSULIN 10.8 10/05/2020   INSULIN 16.7 05/23/2020   INSULIN 15.3 12/15/2018   INSULIN 29.0 (H) 08/25/2018    3. Other depression,with emotional eating 11/23/2020 EKG showed normal sinus rhythm with RBBB. BP today 141/74. Pt reports good control of cravings with Wellbutrin SR 100 mg daily.  He has been on several different dosages of Bupropion SR- highest 200mg  BID.    Assessment/Plan:   1. Hypertension associated with type 2 diabetes mellitus ([place]) [name] is working on healthy weight loss and exercise to improve blood pressure control. We will watch for signs of hypotension as he continues his lifestyle modifications. Check ambulatory blood pressure at home and bring log to next OV.  2. Type 2 diabetes mellitus with other specified complication, without long-term current use of insulin (HCC) Good blood sugar control is important to decrease the likelihood of diabetic complications such as nephropathy, neuropathy, limb loss, blindness, coronary artery disease, and death. Intensive lifestyle modification including diet, exercise and weight loss are the first line of treatment for diabetes. Update medication list- Metformin 1,000mg  with evening meal.  3. Other depression,with emotional eating Check ambulatory blood pressure at home and bring log to next OV. We will refill Wellbutrin, as per below.  - buPROPion (WELLBUTRIN SR) 100 MG 12 hr tablet; Take  1 tablet (100 mg total) by mouth daily.  Dispense: 30 tablet; Refill: 0  4. Class 1 obesity due to excess calories with serious comorbidity and body mass index (BMI) of 31.0 to 31.9 in adult [name] is currently in the action stage of change. As such, his goal is to continue with weight loss efforts. He has agreed to  the Category 3 Plan.   Exercise goals: As is  Behavioral modification strategies: increasing lean protein intake, meal planning and cooking strategies and planning for success.  [name] has agreed to follow-up with our clinic in 2 weeks. He was informed of the importance of frequent follow-up visits to maximize his success with intensive lifestyle modifications for his multiple health conditions.   Objective:   Blood pressure (!) 141/74, pulse 77, temperature 98 F (36.7 C), temperature source Oral, height 5\' 9"  (1.753 m), weight 214 lb (97.1 kg), SpO2 99 %. Body mass index is 31.6 kg/m.  General: Cooperative, alert, well developed, in no acute distress. HEENT: Conjunctivae and lids unremarkable. Cardiovascular: Regular rhythm.  Lungs: Normal work of breathing. Neurologic: No focal deficits.   Lab Results  Component Value Date   CREATININE 0.88 10/05/2020   BUN 17 10/05/2020   NA 142 10/05/2020   K 4.5 10/05/2020   CL 104 10/05/2020   CO2 26 10/05/2020   Lab Results  Component Value Date   ALT 28 10/05/2020   AST 20 10/05/2020   ALKPHOS 103 10/05/2020   BILITOT 0.3 10/05/2020   Lab Results  Component Value Date   HGBA1C 6.0 (H) 10/05/2020   HGBA1C 6.5 (H) 05/23/2020   HGBA1C 6.4 02/23/2020   HGBA1C 6.5 (H) 12/06/2019   HGBA1C 5.6 07/01/2019   Lab Results  Component Value Date   INSULIN 10.8 10/05/2020   INSULIN 16.7 05/23/2020   INSULIN 15.3 12/15/2018   INSULIN 29.0 (H) 08/25/2018   Lab Results  Component Value Date   TSH 3.19 02/23/2020   Lab Results  Component Value Date   CHOL 126 10/05/2020   HDL 36 (L) 10/05/2020   LDLCALC 77 10/05/2020   LDLDIRECT 104.9 12/12/2012   TRIG 61 10/05/2020   CHOLHDL 3.5 10/05/2020   Lab Results  Component Value Date   WBC 7.1 02/23/2020   HGB 13.0 02/23/2020   HCT 39.7 02/23/2020   MCV 94.4 02/23/2020   PLT 211.0 02/23/2020   Lab Results  Component Value Date   FERRITIN 311 12/04/2019    Obesity Behavioral  Intervention:   Approximately 15 minutes were spent on the discussion below.  ASK: We discussed the diagnosis of obesity with [name] today and [name] agreed to give [name] permission to discuss obesity behavioral modification therapy today.  ASSESS: [name] has the diagnosis of obesity and his BMI today is 31.7. [name] is in the action stage of change.   ADVISE: [name] was educated on the multiple health risks of obesity as well as the benefit of weight loss to improve his health. He was advised of the need for long term treatment and the importance of lifestyle modifications to improve his current health and to decrease his risk of future health problems.  AGREE: Multiple dietary modification options and treatment options were discussed and Dreydan agreed to follow the recommendations documented in the above note.  ARRANGE: [name] was educated on the importance of frequent visits to treat obesity as outlined per CMS and USPSTF guidelines and agreed to schedule his next follow up appointment today.  Attestation Statements:   Reviewed by  clinician on day of visit: allergies, medications, problem list, medical history, surgical history, family history, social history, and previous encounter notes.  Coral Ceo, am acting as Location manager for Mina Marble, NP.  I have reviewed the above documentation for accuracy and completeness, and I agree with the above. -  Regginald Pask d. Bandon Sherwin, NP-C

## 2020-12-01 ENCOUNTER — Encounter: Payer: Self-pay | Admitting: Dermatology

## 2020-12-01 ENCOUNTER — Ambulatory Visit: Payer: Medicare Other | Admitting: Dermatology

## 2020-12-01 ENCOUNTER — Other Ambulatory Visit: Payer: Self-pay

## 2020-12-01 DIAGNOSIS — Z79899 Other long term (current) drug therapy: Secondary | ICD-10-CM

## 2020-12-01 DIAGNOSIS — Z1283 Encounter for screening for malignant neoplasm of skin: Secondary | ICD-10-CM | POA: Diagnosis not present

## 2020-12-01 DIAGNOSIS — L409 Psoriasis, unspecified: Secondary | ICD-10-CM | POA: Diagnosis not present

## 2020-12-04 ENCOUNTER — Encounter: Payer: Self-pay | Admitting: Dermatology

## 2020-12-04 LAB — COMPREHENSIVE METABOLIC PANEL
AG Ratio: 1.8 (calc) (ref 1.0–2.5)
ALT: 25 U/L (ref 9–46)
AST: 19 U/L (ref 10–35)
Albumin: 4.2 g/dL (ref 3.6–5.1)
Alkaline phosphatase (APISO): 103 U/L (ref 35–144)
BUN: 19 mg/dL (ref 7–25)
CO2: 29 mmol/L (ref 20–32)
Calcium: 8.9 mg/dL (ref 8.6–10.3)
Chloride: 102 mmol/L (ref 98–110)
Creat: 0.82 mg/dL (ref 0.70–1.18)
Globulin: 2.3 g/dL (calc) (ref 1.9–3.7)
Glucose, Bld: 156 mg/dL — ABNORMAL HIGH (ref 65–139)
Potassium: 4.4 mmol/L (ref 3.5–5.3)
Sodium: 138 mmol/L (ref 135–146)
Total Bilirubin: 0.3 mg/dL (ref 0.2–1.2)
Total Protein: 6.5 g/dL (ref 6.1–8.1)

## 2020-12-04 LAB — ANTI-NUCLEAR AB-TITER (ANA TITER): ANA Titer 1: 1:40 {titer} — ABNORMAL HIGH

## 2020-12-04 LAB — QUANTIFERON-TB GOLD PLUS
Mitogen-NIL: >10 IU/mL
NIL: 0.04 IU/mL
QuantiFERON-TB Gold Plus: NEGATIVE
TB1-NIL: 0 IU/mL
TB2-NIL: 0 IU/mL

## 2020-12-04 LAB — CBC WITH DIFFERENTIAL/PLATELET
Absolute Monocytes: 503 cells/uL (ref 200–950)
Basophils Absolute: 48 cells/uL (ref 0–200)
Basophils Relative: 0.7 %
Eosinophils Absolute: 313 cells/uL (ref 15–500)
Eosinophils Relative: 4.6 %
HCT: 42 % (ref 38.5–50.0)
Hemoglobin: 14.1 g/dL (ref 13.2–17.1)
Lymphs Abs: 2108 cells/uL (ref 850–3900)
MCH: 30.6 pg (ref 27.0–33.0)
MCHC: 33.6 g/dL (ref 32.0–36.0)
MCV: 91.1 fL (ref 80.0–100.0)
MPV: 10 fL (ref 7.5–12.5)
Monocytes Relative: 7.4 %
Neutro Abs: 3828 cells/uL (ref 1500–7800)
Neutrophils Relative %: 56.3 %
Platelets: 218 10*3/uL (ref 140–400)
RBC: 4.61 10*6/uL (ref 4.20–5.80)
RDW: 12.4 % (ref 11.0–15.0)
Total Lymphocyte: 31 %
WBC: 6.8 10*3/uL (ref 3.8–10.8)

## 2020-12-04 LAB — HEPATITIS B SURFACE ANTIBODY, QUANTITATIVE: Hep B S AB Quant (Post): <5 m[IU]/mL — ABNORMAL LOW (ref >=10)

## 2020-12-04 LAB — HEPATITIS C ANTIBODY
Hepatitis C Ab: NONREACTIVE
SIGNAL TO CUT-OFF: 0.01 (ref <1.00)

## 2020-12-04 LAB — HEPATITIS B SURFACE ANTIGEN: Hepatitis B Surface Ag: NONREACTIVE

## 2020-12-04 LAB — HEPATITIS B CORE ANTIBODY, TOTAL: Hep B Core Total Ab: NONREACTIVE

## 2020-12-04 LAB — ANA: Anti Nuclear Antibody (ANA): POSITIVE — AB

## 2020-12-05 ENCOUNTER — Encounter: Payer: Self-pay | Admitting: Dermatology

## 2020-12-05 NOTE — Progress Notes (Signed)
   Follow-Up Visit   Subjective  BHAVESH VAZQUEZ is a 75 y.o. male who presents for the following: Psoriasis (Just on Scalp-Follow up - not clear- tx otezla).  Psoriasis Location: Worst on scalp Duration:  Quality: No longer well controlled Associated Signs/Symptoms: Of intense itching Modifying Factors: Otezla Severity:  Timing: Context: Also would like moles looked at  Objective  Well appearing patient in no apparent distress; mood and affect are within normal limits. Objective  Mid Parietal Scalp: Moderately severe thick plaque psoriasis particularly on head and scalp areas despite Otezla therapy.  Secondary Koebnerization.  Objective  Mid Back: Waist up skin examination, no atypical moles or melanoma.    All skin waist up examined.   Assessment & Plan    Psoriasis Mid Parietal Scalp  Essentially all topical and systemic treatments again reviewed with Mr. Alvizo.  If we can obtain it at an affordable price and if negative TB- skyrizi new start  Other Related Procedures Comprehensive metabolic panel CBC with Differential/Platelet Hepatitis B surface antibody,quantitative Hepatitis B surface antigen Hepatitis B core antibody, total Hepatitis C antibody QuantiFERON-TB Gold Plus ANA  Drug therapy  Other Related Procedures Comprehensive metabolic panel CBC with Differential/Platelet Hepatitis B surface antibody,quantitative Hepatitis B surface antigen Hepatitis B core antibody, total Hepatitis C antibody QuantiFERON-TB Gold Plus ANA  Encounter for screening for malignant neoplasm of skin Mid Back  Annual skin examination.   Other Procedures Placed This Encounter Anti-nuclear ab-titer (ANA titer)     I, Lavonna Monarch, MD, have reviewed all documentation for this visit.  The documentation on 12/05/20 for the exam, diagnosis, procedures, and orders are all accurate and complete.

## 2020-12-06 DIAGNOSIS — H401131 Primary open-angle glaucoma, bilateral, mild stage: Secondary | ICD-10-CM | POA: Diagnosis not present

## 2020-12-08 ENCOUNTER — Ambulatory Visit (HOSPITAL_COMMUNITY)
Admission: EM | Admit: 2020-12-08 | Discharge: 2020-12-08 | Disposition: A | Discharge status: Home / Self Care | Payer: Medicare Other | Attending: Family Medicine | Admitting: Family Medicine

## 2020-12-08 ENCOUNTER — Encounter (HOSPITAL_COMMUNITY): Payer: Self-pay

## 2020-12-08 ENCOUNTER — Other Ambulatory Visit: Payer: Self-pay

## 2020-12-08 DIAGNOSIS — S161XXA Strain of muscle, fascia and tendon at neck level, initial encounter: Secondary | ICD-10-CM | POA: Diagnosis not present

## 2020-12-08 DIAGNOSIS — I10 Essential (primary) hypertension: Secondary | ICD-10-CM

## 2020-12-08 MED ORDER — MELOXICAM 7.5 MG PO TABS: 7.5000 mg | ORAL_TABLET | Freq: Every day | ORAL | 0 refills | Status: DC

## 2020-12-08 MED ORDER — TIZANIDINE HCL 2 MG PO TABS: 2.0000 mg | ORAL_TABLET | Freq: Three times a day (TID) | ORAL | 0 refills | Status: DC | PRN
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
## 2020-12-08 NOTE — ED Triage Notes (Signed)
Pt c/o neck pain X 4 days. Pt states this morning he woke up with swelling around his neck. States he feels he might have pulled a muscle. Pt states he feels his neck is sore. Pt states the pain radiates to his left shoulder.

## 2020-12-08 NOTE — Discharge Instructions (Addendum)
Your blood pressure was noted to be elevated during your visit today. If you are currently taking medication for high blood pressure, please ensure you are taking this as directed. If you do not have a history of high blood pressure and your blood pressure remains persistently elevated, you may need to begin taking a medication at some point. You may return here within the next few days to recheck if unable to see your primary care provider or if you do not have a one.  BP (!) 184/81 (BP Location: Left Arm)   Pulse 76   Temp 98.5 F (36.9 C) (Oral)   Resp 15   SpO2 97%   BP Readings from Last 3 Encounters:  12/08/20 (!) 184/81  11/29/20 (!) 141/74  11/23/20 136/68

## 2020-12-09 DIAGNOSIS — G8929 Other chronic pain: Secondary | ICD-10-CM | POA: Diagnosis not present

## 2020-12-09 DIAGNOSIS — M545 Low back pain, unspecified: Secondary | ICD-10-CM | POA: Diagnosis not present

## 2020-12-13 ENCOUNTER — Encounter: Payer: Medicare Other | Admitting: Orthotics

## 2020-12-13 ENCOUNTER — Encounter (INDEPENDENT_AMBULATORY_CARE_PROVIDER_SITE_OTHER): Payer: Self-pay | Admitting: Adult Health

## 2020-12-13 ENCOUNTER — Ambulatory Visit (INDEPENDENT_AMBULATORY_CARE_PROVIDER_SITE_OTHER): Payer: Medicare Other | Admitting: Adult Health

## 2020-12-13 ENCOUNTER — Other Ambulatory Visit: Payer: Self-pay

## 2020-12-13 ENCOUNTER — Ambulatory Visit (INDEPENDENT_AMBULATORY_CARE_PROVIDER_SITE_OTHER): Payer: Medicare Other | Admitting: Podiatry

## 2020-12-13 VITALS — BP 146/76 | HR 60 | Temp 97.6°F | Ht 69.0 in | Wt 219.0 lb

## 2020-12-13 DIAGNOSIS — F3289 Other specified depressive episodes: Secondary | ICD-10-CM | POA: Diagnosis not present

## 2020-12-13 DIAGNOSIS — M216X2 Other acquired deformities of left foot: Secondary | ICD-10-CM | POA: Diagnosis not present

## 2020-12-13 DIAGNOSIS — E1142 Type 2 diabetes mellitus with diabetic polyneuropathy: Secondary | ICD-10-CM

## 2020-12-13 DIAGNOSIS — E669 Obesity, unspecified: Secondary | ICD-10-CM

## 2020-12-13 DIAGNOSIS — Z6832 Body mass index (BMI) 32.0-32.9, adult: Secondary | ICD-10-CM | POA: Diagnosis not present

## 2020-12-13 DIAGNOSIS — I152 Hypertension secondary to endocrine disorders: Secondary | ICD-10-CM | POA: Diagnosis not present

## 2020-12-13 DIAGNOSIS — L97522 Non-pressure chronic ulcer of other part of left foot with fat layer exposed: Secondary | ICD-10-CM | POA: Diagnosis not present

## 2020-12-13 DIAGNOSIS — E1169 Type 2 diabetes mellitus with other specified complication: Secondary | ICD-10-CM | POA: Diagnosis not present

## 2020-12-13 DIAGNOSIS — E1159 Type 2 diabetes mellitus with other circulatory complications: Secondary | ICD-10-CM | POA: Diagnosis not present

## 2020-12-13 MED ORDER — AMLODIPINE BESYLATE 2.5 MG PO TABS: 2.5000 mg | ORAL_TABLET | Freq: Every day | ORAL | 0 refills | Status: DC

## 2020-12-13 MED ORDER — BUPROPION HCL ER (SR) 100 MG PO TB12: 100.0000 mg | ORAL_TABLET | Freq: Every day | ORAL | 0 refills | Status: DC

## 2020-12-14 NOTE — Progress Notes (Signed)
Chief Complaint:   OBESITY [name] is here to discuss his progress with his obesity treatment plan along with follow-up of his obesity related diagnoses. [name] is on the Category 3 Plan and states he is following his eating plan approximately 70% of the time. [name] states he is doing 0 minutes 0 times per week.  Today's visit was #: 46 Starting weight: 236 lbs Starting date: 08/25/2018 Today's weight: 219 lbs Today's date: 12/13/2020 Total lbs lost to date: 17 lbs Total lbs lost since last in-office visit: 0  Interim History: Last week [name] woke up with cervical neck pain 12/06/2020. He was treated at Urgent Care on 12/08/2020. He was given meloxicam and Zanaflex. His BP was quite elevated at UC. Systolic BP slightly elevated at OV. He rates neck pain 7:10 on pain scale. He has stressors including: financial stress, keeping grandchildren during the week, and chronic pain.  Subjective:   1. Hypertension associated with type 2 diabetes mellitus (HCC) BP at [facility] visit 12/08/2020 was 184/81. Ambulatory readings SBP 130-160 and DBP 70's. BP has been above goal at last several OV's. Pt denies acute cardiac symptoms. He denies edema of lower extremities. He is taking losartan 50 mg daily, carvedilol 25 mg 1/2/tab BID, and amlodipine 5 mg. His heart rate is in the low 60's.  BP Readings from Last 3 Encounters:  12/13/20 (!) 146/76  12/08/20 (!) 184/81  11/29/20 (!) 141/74    2. Other depression,with emotional eating Pt reports excellent control of cravings on Wellbutrin SR 100 mg daily. Highest dose of Wellbutrin SR 200 mg BID.  3. Type 2 diabetes mellitus with other specified complication, without long-term current use of insulin (HCC) Ambulatory fasting BG 110-120. Pt denies episodes of hypoglycemia. He is on Metformin 1000 mg daily with dinner.  Lab Results  Component Value Date   HGBA1C 6.0 (H) 10/05/2020   HGBA1C 6.5 (H) 05/23/2020   HGBA1C 6.4 02/23/2020   Lab Results  Component  Value Date   MICROALBUR 0.6 12/12/2012   LDLCALC 77 10/05/2020   CREATININE 0.82 12/01/2020   Lab Results  Component Value Date   INSULIN 10.8 10/05/2020   INSULIN 16.7 05/23/2020   INSULIN 15.3 12/15/2018   INSULIN 29.0 (H) 08/25/2018    Assessment/Plan:   1. Hypertension associated with type 2 diabetes mellitus ([place]) [name] is working on healthy weight loss and exercise to improve blood pressure control. We will watch for signs of hypotension as he continues his lifestyle modifications. One change to anti-hypertensive therapy- add 2.5 mg to 5 mg daily-  Start amlodipine 2.5 mg daily. Amlodipine 5 mg + 2.5 mg = 7.5 mg total.  No change to any other anti-hypertensive meds. Check ambulatory BP and bring log to next OV. If Red Flag symptoms develop seek immediate medical care. Pt verbalized understanding/agreement.  2. Other depression,with emotional eating Behavior modification techniques were discussed today to help [name] deal with his emotional/non-hunger eating behaviors.  Orders and follow up as documented in patient record.   - buPROPion (WELLBUTRIN SR) 100 MG 12 hr tablet; Take 1 tablet (100 mg total) by mouth daily.  Dispense: 30 tablet; Refill: 0  3. Type 2 diabetes mellitus with other specified complication, without long-term current use of insulin (HCC) Good blood sugar control is important to decrease the likelihood of diabetic complications such as nephropathy, neuropathy, limb loss, blindness, coronary artery disease, and death. Intensive lifestyle modification including diet, exercise and weight loss are the first line of treatment for  diabetes. Continue to monitor BG.  4. Class 1 obesity with serious comorbidity and body mass index (BMI) of 32.0 to 32.9 in adult, unspecified obesity type [name] is currently in the action stage of change. As such, his goal is to continue with weight loss efforts. He has agreed to the Category 3 Plan.   Exercise goals: No exercise has been  prescribed at this time.  Behavioral modification strategies: increasing lean protein intake, decreasing sodium intake, meal planning and cooking strategies and planning for success.  [name] has agreed to follow-up with our clinic in 2 weeks. He was informed of the importance of frequent follow-up visits to maximize his success with intensive lifestyle modifications for his multiple health conditions.   Objective:   Blood pressure (!) 146/76, pulse 60, temperature 97.6 F (36.4 C), height 5\' 9"  (1.753 m), weight 219 lb (99.3 kg), SpO2 96 %. Body mass index is 32.34 kg/m.  General: Cooperative, alert, well developed, in no acute distress. HEENT: Conjunctivae and lids unremarkable. Cardiovascular: Regular rhythm.  Lungs: Normal work of breathing. Neurologic: No focal deficits.   Lab Results  Component Value Date   CREATININE 0.82 12/01/2020   BUN 19 12/01/2020   NA 138 12/01/2020   K 4.4 12/01/2020   CL 102 12/01/2020   CO2 29 12/01/2020   Lab Results  Component Value Date   ALT 25 12/01/2020   AST 19 12/01/2020   ALKPHOS 103 10/05/2020   BILITOT 0.3 12/01/2020   Lab Results  Component Value Date   HGBA1C 6.0 (H) 10/05/2020   HGBA1C 6.5 (H) 05/23/2020   HGBA1C 6.4 02/23/2020   HGBA1C 6.5 (H) 12/06/2019   HGBA1C 5.6 07/01/2019   Lab Results  Component Value Date   INSULIN 10.8 10/05/2020   INSULIN 16.7 05/23/2020   INSULIN 15.3 12/15/2018   INSULIN 29.0 (H) 08/25/2018   Lab Results  Component Value Date   TSH 3.19 02/23/2020   Lab Results  Component Value Date   CHOL 126 10/05/2020   HDL 36 (L) 10/05/2020   LDLCALC 77 10/05/2020   LDLDIRECT 104.9 12/12/2012   TRIG 61 10/05/2020   CHOLHDL 3.5 10/05/2020   Lab Results  Component Value Date   WBC 6.8 12/01/2020   HGB 14.1 12/01/2020   HCT 42.0 12/01/2020   MCV 91.1 12/01/2020   PLT 218 12/01/2020   Lab Results  Component Value Date   FERRITIN 311 12/04/2019    Obesity Behavioral Intervention:    Approximately 15 minutes were spent on the discussion below.  ASK: We discussed the diagnosis of obesity with [name] today and [name] agreed to give [name] permission to discuss obesity behavioral modification therapy today.  ASSESS: Symir has the diagnosis of obesity and his BMI today is 32.4. [name] is in the action stage of change.   ADVISE: [name] was educated on the multiple health risks of obesity as well as the benefit of weight loss to improve his health. He was advised of the need for long term treatment and the importance of lifestyle modifications to improve his current health and to decrease his risk of future health problems.  AGREE: Multiple dietary modification options and treatment options were discussed and Leng agreed to follow the recommendations documented in the above note.  ARRANGE: [name] was educated on the importance of frequent visits to treat obesity as outlined per CMS and USPSTF guidelines and agreed to schedule his next follow up appointment today.  Attestation Statements:   Reviewed by clinician on day of  visit: allergies, medications, problem list, medical history, surgical history, family history, social history, and previous encounter notes.  Coral Ceo, am acting as Location manager for Mina Marble, NP.  I have reviewed the above documentation for accuracy and completeness, and I agree with the above. -  Duron Meister d. Copeland Neisen, NP-C

## 2020-12-20 NOTE — Progress Notes (Signed)
Subjective: 75 year old male presents the office for follow-up evaluation of a wound on the left foot submetatarsal 1.  If continue daily dressing changes with Prisma.  He does admit that he does not wear his offloading shoe all the time particular on the house.  Denies any drainage or pus any swelling or redness.  No red streaks. Denies any fevers, chills, nausea, vomiting.  No calf pain, chest, shortness of breath.   Objective: AAO x3, NAD- wearing a regular shoe DP/PT pulses palpable bilaterally, CRT less than 3 seconds Previous hallux amputation of the left side which is well-healed.  Ulceration submetatarsal 1 with hyperkeratotic periwound.  After debridement the wound measures the same size at 0.7 x 0.5 x 0.2 cm, which is larger today.  There is no drainage or pus identified today.  There is no surrounding erythema, ascending cellulitis but there is no fluctuation crepitation but there is no malodor.  No obvious signs of infection noted today. Hammertoes are present without any ulcerations today.  No pain with calf compression, swelling, warmth, erythema  Assessment: Ulceration left foot-no signs of infection  Plan: -All treatment options discussed with the patient including all alternatives, risks, complications.  -Sharply debrided the ulceration with a #312 blade scalpel without any complication to remove nonviable, devitalized tissue down to healthy, wound, granular tissue to promote wound healing.  No significant fat tissue today was a debrided.   -Continue using Prisma daily. -Offloading-encouraged offloading shoe, surgical shoe at all times. -Discussed surgical intervention with the sesamoids if needed. -Monitor for any clinical signs or symptoms of infection and directed to call the office immediately should any occur or go to the ER.  [name]

## 2020-12-22 ENCOUNTER — Telehealth: Payer: Self-pay

## 2020-12-22 NOTE — Telephone Encounter (Signed)
Information sent to senderra for skyrizi approval

## 2020-12-27 ENCOUNTER — Encounter (INDEPENDENT_AMBULATORY_CARE_PROVIDER_SITE_OTHER): Payer: Self-pay | Admitting: Adult Health

## 2020-12-27 ENCOUNTER — Ambulatory Visit (INDEPENDENT_AMBULATORY_CARE_PROVIDER_SITE_OTHER): Payer: Medicare Other | Admitting: Adult Health

## 2020-12-27 ENCOUNTER — Other Ambulatory Visit: Payer: Self-pay

## 2020-12-27 VITALS — BP 148/62 | HR 69 | Temp 97.6°F | Ht 69.0 in | Wt 219.0 lb

## 2020-12-27 DIAGNOSIS — E66811 Obesity, class 1: Secondary | ICD-10-CM

## 2020-12-27 DIAGNOSIS — E669 Obesity, unspecified: Secondary | ICD-10-CM | POA: Diagnosis not present

## 2020-12-27 DIAGNOSIS — E1159 Type 2 diabetes mellitus with other circulatory complications: Secondary | ICD-10-CM | POA: Diagnosis not present

## 2020-12-27 DIAGNOSIS — M542 Cervicalgia: Secondary | ICD-10-CM

## 2020-12-27 DIAGNOSIS — I152 Hypertension secondary to endocrine disorders: Secondary | ICD-10-CM

## 2020-12-27 DIAGNOSIS — Z6832 Body mass index (BMI) 32.0-32.9, adult: Secondary | ICD-10-CM | POA: Diagnosis not present

## 2020-12-27 DIAGNOSIS — F3289 Other specified depressive episodes: Secondary | ICD-10-CM

## 2020-12-27 HISTORY — DX: Cervicalgia: M54.2

## 2020-12-27 MED ORDER — BUPROPION HCL ER (SR) 100 MG PO TB12: 100.0000 mg | ORAL_TABLET | Freq: Every day | ORAL | 0 refills | Status: DC

## 2020-12-28 NOTE — Progress Notes (Signed)
Chief Complaint:   OBESITY [name] is here to discuss his progress with his obesity treatment plan along with follow-up of his obesity related diagnoses. [name] is on the Category 3 Plan and states he is following his eating plan approximately 75% of the time. [name] states he is walking at work 2 times per week.  Today's visit was #: 36 Starting weight: 236 lbs Starting date: 08/25/2018 Today's weight: 219 lbs Today's date: 12/27/2020 Total lbs lost to date: 17 lbs Total lbs lost since last in-office visit: 0  Interim History: Dr. Jacqualyn Posey at podiatry placed [place] in a post op shoe when not working. He feels that he has hit a plateau with his weight loss. He has maintained his weight the last 2 visits. He would like to space out appointments more. He did not bring in ambulatory BP log, however, he reports readings have been elevated at home, despite recent increase in amlodipine from 5mg  to 7.5mg  QD.  He denies increase in lower ext edema.  He has chronic 1+ edema of LLE.  Subjective:   1. Hypertension associated with type 2 diabetes mellitus ([place]) [name] did not bring ambulatory BP log to OV. He reports elevated BP at home, as high as SBP 180. He denies any cardiac symptoms. He has increased amlodipine from 5 mg to 7.5 mg (5+2.5).  He denies increase in lower ext edema.  He has chronic 1+ edema of LLE. He has continued all other anti-hypertensives without change.  He has a history of renal artery stenosis.   BP Readings from Last 3 Encounters:  12/27/20 (!) 148/62  12/13/20 (!) 146/76  12/08/20 (!) 184/81   2. Other depression,with emotional eating Amin reports stable mood. Pt denies suicidal or homicidal ideations. He is on sertraline 50 mg daily and Wellbutrin SR 100 mg daily, which he feels has decreased his cravings.  3. Posterior neck pain [name] has continued posterior neck pain, despite oral topical analgesia.   Assessment/Plan:   1. Hypertension associated with type 2  diabetes mellitus ([place]) [name] is working on healthy weight loss and exercise to improve blood pressure control.  Follow up with PCP for better control of HTN. Epic review shows he has OV with PCP 12/30/20 If Red Flag symptoms develop, seek immediate medical assistance. Pt verbalized understanding and agreement.   2. Other depression,with emotional eating Behavior modification techniques were discussed today to help [name] deal with his emotional/non-hunger eating behaviors.  Orders and follow up as documented in patient record.   - buPROPion (WELLBUTRIN SR) 100 MG 12 hr tablet; Take 1 tablet (100 mg total) by mouth daily.  Dispense: 45 tablet; Refill: 0  3. Posterior neck pain Apply heating pad to neck. Follow up with [name] at neurosurgery.  4. Class 1 obesity with serious comorbidity and body mass index (BMI) of 32.0 to 32.9 in adult, unspecified obesity type [name] is currently in the action stage of change. As such, his goal is to continue with weight loss efforts. He has agreed to the Category 3 Plan.   Reduce snacking- <300 cal/day. Increase daily protein.  Exercise goals: As is  Behavioral modification strategies: increasing lean protein intake, decreasing simple carbohydrates, no skipping meals, meal planning and cooking strategies, better snacking choices and planning for success.  [name] has agreed to follow-up with our clinic in 6 weeks. He was informed of the importance of frequent follow-up visits to maximize his success with intensive lifestyle modifications for his multiple health conditions.  Objective:   Blood pressure (!) 148/62, pulse 69, temperature 97.6 F (36.4 C), height 5\' 9"  (1.753 m), weight 219 lb (99.3 kg), SpO2 95 %. Body mass index is 32.34 kg/m.  General: Cooperative, alert, well developed, in no acute distress. HEENT: Conjunctivae and lids unremarkable. Cardiovascular: Regular rhythm.  Lungs: Normal work of breathing. Neurologic: No focal  deficits.   Lab Results  Component Value Date   CREATININE 0.82 12/01/2020   BUN 19 12/01/2020   NA 138 12/01/2020   K 4.4 12/01/2020   CL 102 12/01/2020   CO2 29 12/01/2020   Lab Results  Component Value Date   ALT 25 12/01/2020   AST 19 12/01/2020   ALKPHOS 103 10/05/2020   BILITOT 0.3 12/01/2020   Lab Results  Component Value Date   HGBA1C 6.0 (H) 10/05/2020   HGBA1C 6.5 (H) 05/23/2020   HGBA1C 6.4 02/23/2020   HGBA1C 6.5 (H) 12/06/2019   HGBA1C 5.6 07/01/2019   Lab Results  Component Value Date   INSULIN 10.8 10/05/2020   INSULIN 16.7 05/23/2020   INSULIN 15.3 12/15/2018   INSULIN 29.0 (H) 08/25/2018   Lab Results  Component Value Date   TSH 3.19 02/23/2020   Lab Results  Component Value Date   CHOL 126 10/05/2020   HDL 36 (L) 10/05/2020   LDLCALC 77 10/05/2020   LDLDIRECT 104.9 12/12/2012   TRIG 61 10/05/2020   CHOLHDL 3.5 10/05/2020   Lab Results  Component Value Date   WBC 6.8 12/01/2020   HGB 14.1 12/01/2020   HCT 42.0 12/01/2020   MCV 91.1 12/01/2020   PLT 218 12/01/2020   Lab Results  Component Value Date   FERRITIN 311 12/04/2019    Obesity Behavioral Intervention:   Approximately 15 minutes were spent on the discussion below.  ASK: We discussed the diagnosis of obesity with [name] today and [name] agreed to give [name] permission to discuss obesity behavioral modification therapy today.  ASSESS: [name] has the diagnosis of obesity and his BMI today is 32.4. [name] is in the action stage of change.   ADVISE: [name] was educated on the multiple health risks of obesity as well as the benefit of weight loss to improve his health. He was advised of the need for long term treatment and the importance of lifestyle modifications to improve his current health and to decrease his risk of future health problems.  AGREE: Multiple dietary modification options and treatment options were discussed and [name] agreed to follow the recommendations documented  in the above note.  ARRANGE: [name] was educated on the importance of frequent visits to treat obesity as outlined per CMS and USPSTF guidelines and agreed to schedule his next follow up appointment today.  Attestation Statements:   Reviewed by clinician on day of visit: allergies, medications, problem list, medical history, surgical history, family history, social history, and previous encounter notes.  Coral Ceo, am acting as Location manager for Mina Marble, NP.  I have reviewed the above documentation for accuracy and completeness, and I agree with the above. -  [name] d. Suhailah Kwan, NP-C

## 2020-12-29 ENCOUNTER — Ambulatory Visit (INDEPENDENT_AMBULATORY_CARE_PROVIDER_SITE_OTHER): Payer: Medicare Other | Admitting: Podiatry

## 2020-12-29 ENCOUNTER — Other Ambulatory Visit: Payer: Self-pay

## 2020-12-29 DIAGNOSIS — L97522 Non-pressure chronic ulcer of other part of left foot with fat layer exposed: Secondary | ICD-10-CM | POA: Diagnosis not present

## 2020-12-29 DIAGNOSIS — E1142 Type 2 diabetes mellitus with diabetic polyneuropathy: Secondary | ICD-10-CM

## 2020-12-30 ENCOUNTER — Encounter: Payer: Self-pay | Admitting: Internal Medicine

## 2020-12-30 ENCOUNTER — Ambulatory Visit (INDEPENDENT_AMBULATORY_CARE_PROVIDER_SITE_OTHER): Payer: Medicare Other | Admitting: Internal Medicine

## 2020-12-30 VITALS — BP 142/70 | HR 72 | Temp 97.5°F | Resp 18 | Ht 69.0 in | Wt 226.1 lb

## 2020-12-30 DIAGNOSIS — E1159 Type 2 diabetes mellitus with other circulatory complications: Secondary | ICD-10-CM

## 2020-12-30 DIAGNOSIS — M542 Cervicalgia: Secondary | ICD-10-CM | POA: Diagnosis not present

## 2020-12-30 DIAGNOSIS — I152 Hypertension secondary to endocrine disorders: Secondary | ICD-10-CM

## 2020-12-30 MED ORDER — PREDNISONE 10 MG PO TABS
ORAL_TABLET | ORAL | 0 refills | Status: DC
Start: 2020-12-30 — End: 2021-03-22

## 2020-12-30 NOTE — Patient Instructions (Signed)
For your neck pain: Continue with Tylenol  500 mg OTC 2 tabs a day every 8 hours as needed for pain Heating pad twice a day Take prednisone as prescribed If you are not gradually better please let me know.  Check the  blood pressure daily.  If this is slightly elevated, rest, drink some water and check again BP GOAL is between 110/65 and  135/85. If it is consistently higher or lower, let me know next week.   Prednisone may increase your blood sugar, if they are consistently higher than 180 stop prednisone let us know

## 2020-12-30 NOTE — Progress Notes (Signed)
Pre visit review using our clinic review tool, if applicable. No additional management support is needed unless otherwise documented below in the visit note. 
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
## 2021-01-01 NOTE — Progress Notes (Signed)
Subjective: 75 year old male presents the office for follow-up evaluation of a wound on the left foot submetatarsal 1.  He has been continue with the Prisma dressing changes daily.  Denies any drainage approximately swelling or redness.  No pain.  He has no new concerns today otherwise. Denies any fevers, chills, nausea, vomiting.  No calf pain, chest, shortness of breath.   Objective: AAO x3, NAD- wearing a regular shoe DP/PT pulses palpable bilaterally, CRT less than 3 seconds Previous hallux amputation of the left side which is well-healed.  Ulceration submetatarsal 1 with hyperkeratotic periwound.  After debridement the wound measures the same size at 0.4 x 0.3 x 0.2 cm. There is no drainage or pus identified today.  There is no surrounding erythema, ascending cellulitis but there is no fluctuation crepitation but there is no malodor.  No obvious signs of infection noted today. Hammertoes are present without any ulcerations today.  No pain with calf compression, swelling, warmth, erythema  Assessment: Ulceration left foot-no signs of infection  Plan: -All treatment options discussed with the patient including all alternatives, risks, complications.  -Sharply debrided the ulceration with a #312 blade scalpel without any complication to remove nonviable, devitalized tissue down to healthy, wound, granular tissue to promote wound healing.  No significant fat tissue today was a debrided.   -Continue using Prisma every other day. -Offloading-encouraged offloading shoe, surgical shoe at all times. -Discussed surgical intervention with the sesamoids if needed. -Monitor for any clinical signs or symptoms of infection and directed to call the office immediately should any occur or go to the ER.  [name]

## 2021-01-03 MED ORDER — TIZANIDINE HCL 2 MG PO TABS: 2.0000 mg | ORAL_TABLET | Freq: Three times a day (TID) | ORAL | 0 refills | Status: DC | PRN

## 2021-01-08 ENCOUNTER — Other Ambulatory Visit (INDEPENDENT_AMBULATORY_CARE_PROVIDER_SITE_OTHER): Payer: Self-pay | Admitting: Adult Health

## 2021-01-10 ENCOUNTER — Telehealth: Payer: Self-pay | Admitting: Dermatology

## 2021-01-10 NOTE — Telephone Encounter (Signed)
Says insurance OK'd the Rx  BUT he can't afford the copay: $18000 for 3 months. He doesn't know name of med, but there was only one. ST patient, previously saw [facility]

## 2021-01-10 NOTE — Telephone Encounter (Signed)
Phone call to patient to inform him that now [name] will forward his information to the Financial Assistance department for review for available funding to help him get the Skyrizi at an affordable price.  Patient aware.

## 2021-01-16 NOTE — Telephone Encounter (Signed)
[facility] update 3330.12 is copay for skyrizi and financial assistance department will contact us

## 2021-01-22 ENCOUNTER — Other Ambulatory Visit: Payer: Self-pay | Admitting: Family Medicine

## 2021-01-22 ENCOUNTER — Other Ambulatory Visit (INDEPENDENT_AMBULATORY_CARE_PROVIDER_SITE_OTHER): Payer: Self-pay | Admitting: Adult Health

## 2021-01-22 DIAGNOSIS — F3289 Other specified depressive episodes: Secondary | ICD-10-CM

## 2021-01-23 MED ORDER — AMLODIPINE BESYLATE 2.5 MG PO TABS: 2.5000 mg | ORAL_TABLET | Freq: Every day | ORAL | 0 refills | Status: DC

## 2021-01-23 MED ORDER — LOSARTAN POTASSIUM 50 MG PO TABS: 50.0000 mg | ORAL_TABLET | Freq: Every day | ORAL | 1 refills | Status: DC

## 2021-01-23 MED ORDER — ALPRAZOLAM 0.25 MG PO TABS: 0.2500 mg | ORAL_TABLET | Freq: Two times a day (BID) | ORAL | 1 refills | Status: DC | PRN

## 2021-01-23 MED ORDER — CARVEDILOL 25 MG PO TABS: 12.5000 mg | ORAL_TABLET | Freq: Two times a day (BID) | ORAL | 1 refills | Status: DC

## 2021-01-23 NOTE — Telephone Encounter (Signed)
Requesting: alprazolam 0.25mg  Contract: 12/06/2016 UDS: None Last Visit: 12/30/2020 Next Visit: 05/18/2021 Last Refill: 05/30/2020 #5 and 0RF  Please Advise

## 2021-01-23 NOTE — Telephone Encounter (Signed)
Refill request

## 2021-01-24 ENCOUNTER — Ambulatory Visit (INDEPENDENT_AMBULATORY_CARE_PROVIDER_SITE_OTHER): Payer: Medicare Other | Admitting: Podiatry

## 2021-01-24 ENCOUNTER — Other Ambulatory Visit: Payer: Self-pay

## 2021-01-24 DIAGNOSIS — L03119 Cellulitis of unspecified part of limb: Secondary | ICD-10-CM

## 2021-01-24 DIAGNOSIS — E1159 Type 2 diabetes mellitus with other circulatory complications: Secondary | ICD-10-CM | POA: Diagnosis not present

## 2021-01-24 DIAGNOSIS — Z89412 Acquired absence of left great toe: Secondary | ICD-10-CM | POA: Diagnosis not present

## 2021-01-24 DIAGNOSIS — L02619 Cutaneous abscess of unspecified foot: Secondary | ICD-10-CM

## 2021-01-24 DIAGNOSIS — L97522 Non-pressure chronic ulcer of other part of left foot with fat layer exposed: Secondary | ICD-10-CM

## 2021-01-24 DIAGNOSIS — L97509 Non-pressure chronic ulcer of other part of unspecified foot with unspecified severity: Secondary | ICD-10-CM | POA: Diagnosis not present

## 2021-01-24 DIAGNOSIS — E1142 Type 2 diabetes mellitus with diabetic polyneuropathy: Secondary | ICD-10-CM

## 2021-01-24 MED ORDER — AMOXICILLIN-POT CLAVULANATE 875-125 MG PO TABS: 1.0000 | ORAL_TABLET | Freq: Two times a day (BID) | ORAL | 0 refills | Status: DC

## 2021-01-24 NOTE — Patient Instructions (Signed)

## 2021-01-28 NOTE — Progress Notes (Addendum)
Subjective: 75 year old male presents the office for follow-up evaluation of a wound on the left foot submetatarsal 1.  He has been wearing a surgical shoe with offloading but feels this is wearing out and actually causes pressure.  He is continue with daily dressing changes with Prisma.  No drainage or pus he reports.  No other concerns today. Denies any fevers, chills, nausea, vomiting.  No calf pain, chest, shortness of breath.   Objective: AAO x3, NAD- wearing a regular shoe DP/PT pulses palpable bilaterally, CRT less than 3 seconds Previous hallux amputation of the left side which is well-healed.  Ulceration submetatarsal 1 with hyperkeratotic periwound.  After debridement the wound measures the same size at 0.6 x 0.5 x 0.2 cm.  There is minimal surrounding edema there is no significant erythema or warmth.  No ascending cellulitis and there is no fluctuation or crepitation.  There is no malodor.   Hammertoes are present without any ulcerations today.  No pain with calf compression, swelling, warmth, erythema  Assessment: Ulceration left foot  Plan: -All treatment options discussed with the patient including all alternatives, risks, complications.  -Sharply debrided the ulceration with a #312 blade scalpel without any complication to remove nonviable, devitalized tissue down to healthy, wound, granular tissue to promote wound healing.  No significant fat tissue today was a debrided.   -There is increased size of the wound as well as slight edema on the order Augmentin.  We dispensed diabetic shoes today.  This has offloading for the first metatarsal head.  Hopefully this will further offload the wound in order to help promote wound healing.  Getting the surgical shoes causing irritation as well.  However discussed if issues causing any issues to return back to the surgical shoe discussed daily foot inspection.  Oral and written break-in instructions were provided to the patient today.  [name]   *Addendum: New L5000 was dispensed today. The patient previously had one however he did develop a new ulcer and the insert was no longer fitting/offloading appropriately. The previous insert had been modified in order try to offload the new ulcer but did not do so. Therefore a new L5000 was made.

## 2021-01-31 ENCOUNTER — Other Ambulatory Visit: Payer: Self-pay

## 2021-01-31 ENCOUNTER — Encounter: Payer: Self-pay | Admitting: Podiatry

## 2021-01-31 ENCOUNTER — Ambulatory Visit (INDEPENDENT_AMBULATORY_CARE_PROVIDER_SITE_OTHER): Payer: Medicare Other | Admitting: Podiatry

## 2021-01-31 DIAGNOSIS — L97522 Non-pressure chronic ulcer of other part of left foot with fat layer exposed: Secondary | ICD-10-CM | POA: Diagnosis not present

## 2021-01-31 DIAGNOSIS — L97529 Non-pressure chronic ulcer of other part of left foot with unspecified severity: Secondary | ICD-10-CM

## 2021-01-31 DIAGNOSIS — E11621 Type 2 diabetes mellitus with foot ulcer: Secondary | ICD-10-CM

## 2021-02-02 NOTE — Progress Notes (Signed)
Subjective: 75 year old male presents the office for follow-up evaluation of a wound on the left foot submetatarsal 1.  States he is doing fine.  Not seeing any swelling or redness or any drainage.  He still using the Prisma to the wound.  Denies any drainage or pus or any red streaks.  Denies any fevers or chills.  No nausea or vomiting.  No other systemic symptoms.  Objective: AAO x3, NAD- wearing a regular shoe DP/PT pulses palpable bilaterally, CRT less than 3 seconds Previous hallux amputation of the left side which is well-healed.  Ulceration submetatarsal 1 with hyperkeratotic periwound.  There is slight increased hyperkeratotic tissue.  After debridement the wound is larger measuring 1.2 x 0.8 cm x 0.3 cm.  There is no undermining or tunneling.  Nose probing to bone, undermining tunneling.  No surrounding erythema, ascending cellulitis drainage or pus or any signs of infection. Hammertoes are present without any ulcerations today.  No pain with calf compression, swelling, warmth, erythema  Assessment: Ulceration left foot  Plan: -All treatment options discussed with the patient including all alternatives, risks, complications.  -Sharply debrided the ulceration with a #312 blade scalpel without any complication to remove nonviable, devitalized tissue down to healthy, wound, granular tissue to promote wound healing.  Tolerated the procedure well and complications with minimal blood loss. -Wound does not appear to be infected however not healing.  We will try to order a graft for him through [employer] -Discussed surgical intervention if needed. -Continue offloading at all times. -Monitor for any clinical signs or symptoms of infection and directed to call the office immediately should any occur or go to the ER.   [name]

## 2021-02-07 ENCOUNTER — Ambulatory Visit (INDEPENDENT_AMBULATORY_CARE_PROVIDER_SITE_OTHER): Payer: Medicare Other | Admitting: Adult Health

## 2021-02-07 ENCOUNTER — Encounter (INDEPENDENT_AMBULATORY_CARE_PROVIDER_SITE_OTHER): Payer: Self-pay | Admitting: Adult Health

## 2021-02-07 ENCOUNTER — Other Ambulatory Visit: Payer: Self-pay

## 2021-02-07 VITALS — BP 134/66 | HR 60 | Temp 97.8°F | Ht 69.0 in | Wt 220.0 lb

## 2021-02-07 DIAGNOSIS — Z6832 Body mass index (BMI) 32.0-32.9, adult: Secondary | ICD-10-CM | POA: Diagnosis not present

## 2021-02-07 DIAGNOSIS — E669 Obesity, unspecified: Secondary | ICD-10-CM | POA: Diagnosis not present

## 2021-02-07 DIAGNOSIS — E1169 Type 2 diabetes mellitus with other specified complication: Secondary | ICD-10-CM

## 2021-02-07 DIAGNOSIS — E1159 Type 2 diabetes mellitus with other circulatory complications: Secondary | ICD-10-CM

## 2021-02-07 DIAGNOSIS — I152 Hypertension secondary to endocrine disorders: Secondary | ICD-10-CM | POA: Diagnosis not present

## 2021-02-07 MED ORDER — AMLODIPINE BESYLATE 5 MG PO TABS: 5.0000 mg | ORAL_TABLET | Freq: Every day | ORAL | 0 refills | Status: DC

## 2021-02-08 NOTE — Progress Notes (Signed)
Chief Complaint:   OBESITY [name] is here to discuss his progress with his obesity treatment plan along with follow-up of his obesity related diagnoses. [name] is on the Category 3 Plan and states he is following his eating plan approximately 60% of the time. [name] states he is not currently exercising.  Today's visit was #: 47 Starting weight: 236 lbs Starting date: 08/25/2018 Today's weight: 220 lbs Today's date: 02/07/2021 Total lbs lost to date: 16 lbs Total lbs lost since last in-office visit: 0  Interim History: When he eats off plan, [name] will consume potato chips, nab crackers, and sweets (ie: sugar free cookies). He will f/u with Dr. Jacqualyn Posey at podiatry on 02/20/2021 to continue treatment for his left foot sub metatarsal wound.   Subjective:   1. Hypertension associated with type 2 diabetes mellitus ([name]) [name] is taking Coreg 25 mg 1/2 tab BID, Norvasc 2.5 mg + 5 mg for 7.5 mg QD, and losartan 50 mg QD. His ambulatory readings are SBP 130-160 (mostly 130-140) and DBP 70-80. He denies cardiac symptoms.  BP Readings from Last 3 Encounters:  02/07/21 134/66  12/30/20 (!) 142/70  12/27/20 (!) 148/62    2. Type 2 diabetes mellitus with other specified complication, without long-term current use of insulin ([name]) [name] had an A1c of 6.0 on 10/05/2020. He is on Metformin 1,000 mg QD. His ambulatory fasting BG 120-141 with an occasional elevated 160, 180, 220.  Lab Results  Component Value Date   HGBA1C 6.0 (H) 10/05/2020   HGBA1C 6.5 (H) 05/23/2020   HGBA1C 6.4 02/23/2020   Lab Results  Component Value Date   MICROALBUR 0.6 12/12/2012   LDLCALC 77 10/05/2020   CREATININE 0.82 12/01/2020   Lab Results  Component Value Date   INSULIN 10.8 10/05/2020   INSULIN 16.7 05/23/2020   INSULIN 15.3 12/15/2018   INSULIN 29.0 (H) 08/25/2018    Assessment/Plan:   1. Hypertension associated with type 2 diabetes mellitus ([name]) [name] is working on healthy weight loss and  exercise to improve blood pressure control. We will watch for signs of hypotension as he continues his lifestyle modifications. Continue current anti-hypertensive regimen. Continue to monitor ambulatory readings.  - amLODipine (NORVASC) 5 MG tablet; Take 1 tablet (5 mg total) by mouth daily. Pt to take 2.5mg  and 5 mg daily to equal 7.5mg  QD  Dispense: 30 tablet; Refill: 0  2. Type 2 diabetes mellitus with other specified complication, without long-term current use of insulin ([name]) Good blood sugar control is important to decrease the likelihood of diabetic complications such as nephropathy, neuropathy, limb loss, blindness, coronary artery disease, and death. Intensive lifestyle modification including diet, exercise and weight loss are the first line of treatment for diabetes. Continue Metformin and category 3 meal plan.  3. Class 1 obesity with serious comorbidity and body mass index (BMI) of 32.0 to 32.9 in adult, unspecified obesity type [name] is currently in the action stage of change. As such, his goal is to continue with weight loss efforts. He has agreed to the Category 3 Plan.   Exercise goals: As is  Behavioral modification strategies: increasing lean protein intake, decreasing simple carbohydrates, meal planning and cooking strategies, better snacking choices and planning for success.  [name] has agreed to follow-up with our clinic in 4 weeks. He was informed of the importance of frequent follow-up visits to maximize his success with intensive lifestyle modifications for his multiple health conditions.   Objective:   Blood pressure 134/66, pulse 60,  temperature 97.8 F (36.6 C), height 5\' 9"  (1.753 m), weight 220 lb (99.8 kg), SpO2 96 %. Body mass index is 32.49 kg/m.  General: Cooperative, alert, well developed, in no acute distress. HEENT: Conjunctivae and lids unremarkable. Cardiovascular: Regular rhythm.  Lungs: Normal work of breathing. Neurologic: No focal deficits.   Lab  Results  Component Value Date   CREATININE 0.82 12/01/2020   BUN 19 12/01/2020   NA 138 12/01/2020   K 4.4 12/01/2020   CL 102 12/01/2020   CO2 29 12/01/2020   Lab Results  Component Value Date   ALT 25 12/01/2020   AST 19 12/01/2020   ALKPHOS 103 10/05/2020   BILITOT 0.3 12/01/2020   Lab Results  Component Value Date   HGBA1C 6.0 (H) 10/05/2020   HGBA1C 6.5 (H) 05/23/2020   HGBA1C 6.4 02/23/2020   HGBA1C 6.5 (H) 12/06/2019   HGBA1C 5.6 07/01/2019   Lab Results  Component Value Date   INSULIN 10.8 10/05/2020   INSULIN 16.7 05/23/2020   INSULIN 15.3 12/15/2018   INSULIN 29.0 (H) 08/25/2018   Lab Results  Component Value Date   TSH 3.19 02/23/2020   Lab Results  Component Value Date   CHOL 126 10/05/2020   HDL 36 (L) 10/05/2020   LDLCALC 77 10/05/2020   LDLDIRECT 104.9 12/12/2012   TRIG 61 10/05/2020   CHOLHDL 3.5 10/05/2020   Lab Results  Component Value Date   WBC 6.8 12/01/2020   HGB 14.1 12/01/2020   HCT 42.0 12/01/2020   MCV 91.1 12/01/2020   PLT 218 12/01/2020   Lab Results  Component Value Date   FERRITIN 311 12/04/2019    Obesity Behavioral Intervention:   Approximately 15 minutes were spent on the discussion below.  ASK: We discussed the diagnosis of obesity with [name] today and [name] agreed to give [name] permission to discuss obesity behavioral modification therapy today.  ASSESS: [name] has the diagnosis of obesity and his BMI today is 32.5. [name] is in the action stage of change.   ADVISE: [name] was educated on the multiple health risks of obesity as well as the benefit of weight loss to improve his health. He was advised of the need for long term treatment and the importance of lifestyle modifications to improve his current health and to decrease his risk of future health problems.  AGREE: Multiple dietary modification options and treatment options were discussed and [name] agreed to follow the recommendations documented in the above  note.  ARRANGE: [name] was educated on the importance of frequent visits to treat obesity as outlined per CMS and USPSTF guidelines and agreed to schedule his next follow up appointment today.  Attestation Statements:   Reviewed by clinician on day of visit: allergies, medications, problem list, medical history, surgical history, family history, social history, and previous encounter notes.  Coral Ceo, am acting as Location manager for Mina Marble, NP.  I have reviewed the above documentation for accuracy and completeness, and I agree with the above. -  Beniah Magnan d. Asti Mackley, NP-C

## 2021-02-15 ENCOUNTER — Ambulatory Visit: Payer: Medicare Other | Admitting: Podiatry

## 2021-02-15 ENCOUNTER — Other Ambulatory Visit: Payer: Self-pay

## 2021-02-15 DIAGNOSIS — L97529 Non-pressure chronic ulcer of other part of left foot with unspecified severity: Secondary | ICD-10-CM

## 2021-02-15 DIAGNOSIS — E11621 Type 2 diabetes mellitus with foot ulcer: Secondary | ICD-10-CM | POA: Diagnosis not present

## 2021-02-15 DIAGNOSIS — L97522 Non-pressure chronic ulcer of other part of left foot with fat layer exposed: Secondary | ICD-10-CM

## 2021-02-15 MED ORDER — DOXYCYCLINE HYCLATE 100 MG PO TABS: 100.0000 mg | ORAL_TABLET | Freq: Two times a day (BID) | ORAL | 0 refills | Status: DC

## 2021-02-16 ENCOUNTER — Telehealth: Payer: Self-pay

## 2021-02-16 ENCOUNTER — Encounter: Payer: Self-pay | Admitting: Podiatry

## 2021-02-16 NOTE — Telephone Encounter (Signed)
Patient's wife came by the office today and dropped off his patient assistance paperwork for [employer].

## 2021-02-16 NOTE — Progress Notes (Signed)
Subjective: 75 year old male presents the office for follow-up evaluation of a wound on the left foot submetatarsal 1.  States he is doing fine.  Not seeing any swelling or redness or any drainage.  He has been using Medihoney.  Denies any drainage or pus or any red streaks.  Denies any fevers or chills.  No nausea or vomiting.  No other systemic symptoms.  Objective: AAO x3, NAD- wearing a regular shoe DP/PT pulses palpable bilaterally, CRT less than 3 seconds Previous hallux amputation of the left side which is well-healed.  Ulceration submetatarsal 1 with hyperkeratotic periwound.  There is slight increased hyperkeratotic tissue.  After debridement the wound is larger measuring 1.2 x 0.8 cm x 0.3 cm.  There is no undermining or tunneling.  Nose probing to bone, undermining tunneling.  No surrounding erythema, ascending cellulitis drainage or pus or any signs of infection. Hammertoes are present without any ulcerations today.  No pain with calf compression, swelling, warmth, erythema  Assessment: Ulceration left foot~stagnant  Plan: -All treatment options discussed with the patient including all alternatives, risks, complications.  -Sharply debrided the ulceration with a #312 blade scalpel without any complication to remove nonviable, devitalized tissue down to healthy, wound, granular tissue to promote wound healing.  Tolerated the procedure well and complications with minimal blood loss. -Patient was placed on doxycycline for skin and soft tissue prophylaxis. -Awaiting graft authorization through [employer] -Discussed surgical intervention if needed. -Continue offloading at all times. -Monitor for any clinical signs or symptoms of infection and directed to call the office immediately should any occur or go to the ER.   Felipa Furnace DPM

## 2021-02-16 NOTE — Telephone Encounter (Signed)
Faxed patient's [employer] patient assistance paperwork to [facility] Patient Assistance.

## 2021-02-20 ENCOUNTER — Ambulatory Visit (INDEPENDENT_AMBULATORY_CARE_PROVIDER_SITE_OTHER): Payer: Medicare Other | Admitting: Podiatry

## 2021-02-20 ENCOUNTER — Other Ambulatory Visit: Payer: Self-pay

## 2021-02-20 DIAGNOSIS — E11621 Type 2 diabetes mellitus with foot ulcer: Secondary | ICD-10-CM | POA: Diagnosis not present

## 2021-02-20 DIAGNOSIS — L97529 Non-pressure chronic ulcer of other part of left foot with unspecified severity: Secondary | ICD-10-CM

## 2021-02-20 DIAGNOSIS — L97522 Non-pressure chronic ulcer of other part of left foot with fat layer exposed: Secondary | ICD-10-CM

## 2021-02-21 ENCOUNTER — Telehealth: Payer: Self-pay | Admitting: *Deleted

## 2021-02-21 NOTE — Telephone Encounter (Signed)
My [employer] assist sent letter stating they have received patient assistance application for [employer]ri[employer] and will contact us with results.

## 2021-02-23 NOTE — Progress Notes (Signed)
Subjective: 75 year old male presents the office for follow-up evaluation of a wound on the left foot submetatarsal 1.  He has followed up with Dr. Posey Pronto since I was out of the office and he was placed on antibiotics.  Some mild drainage was apparently reported.  He still on doxycycline. Denies any fevers or chills.  No nausea or vomiting.  No other systemic symptoms.  Objective: AAO x3, NAD- wearing a regular shoe DP/PT pulses palpable bilaterally, CRT less than 3 seconds Previous hallux amputation of the left side which is well-healed.  Ulceration submetatarsal 1 with hyperkeratotic periwound.  Hyperkeratotic tissue is noted on the periphery of the wound.  After debridement the wound is still the same size at 1.2 x 0.7 x 0.3 cm.  There is no probing to bone, undermining or tunneling.  There is no surrounding erythema, ascending cellulitis.  There is no fluctuation crepitation.  There is no malodor. Hammertoes are present without any ulcerations today.  No pain with calf compression, swelling, warmth, erythema  Assessment: Ulceration left foot  Plan: -All treatment options discussed with the patient including all alternatives, risks, complications.  -Sharply debrided the ulceration with a #312 blade scalpel without any complication to remove nonviable, devitalized tissue down to healthy, wound, granular tissue to promote wound healing.  Tolerated the procedure well and complications with minimal blood loss. -Finish course of doxycycline. -Continue offloading. -Awaiting graft approval  Return in about 1 week (around 02/27/2021).  [name]

## 2021-02-27 ENCOUNTER — Telehealth: Payer: Self-pay | Admitting: *Deleted

## 2021-02-27 ENCOUNTER — Ambulatory Visit: Payer: Medicare Other | Admitting: Podiatry

## 2021-02-27 ENCOUNTER — Encounter: Payer: Self-pay | Admitting: Podiatry

## 2021-02-27 ENCOUNTER — Ambulatory Visit (INDEPENDENT_AMBULATORY_CARE_PROVIDER_SITE_OTHER): Payer: Medicare Other

## 2021-02-27 ENCOUNTER — Other Ambulatory Visit: Payer: Self-pay

## 2021-02-27 VITALS — BP 167/89 | HR 79 | Temp 97.2°F

## 2021-02-27 DIAGNOSIS — L03116 Cellulitis of left lower limb: Secondary | ICD-10-CM

## 2021-02-27 DIAGNOSIS — L97522 Non-pressure chronic ulcer of other part of left foot with fat layer exposed: Secondary | ICD-10-CM

## 2021-02-27 MED ORDER — SULFAMETHOXAZOLE-TRIMETHOPRIM 800-160 MG PO TABS: 1.0000 | ORAL_TABLET | Freq: Two times a day (BID) | ORAL | 0 refills | Status: DC

## 2021-02-27 MED ORDER — MUPIROCIN 2 % EX OINT: 1.0000 "application " | TOPICAL_OINTMENT | Freq: Two times a day (BID) | CUTANEOUS | 2 refills | Status: DC

## 2021-02-27 NOTE — Telephone Encounter (Signed)
[name] tried calling him last week about this. It is covered however he has a 20% coinsurnace until he meets his deductible of $3600 and apparently has only met $250. So he would be responsible for the graft cost.

## 2021-02-27 NOTE — Telephone Encounter (Signed)
Called and spoke to patients wife about what Dr. Lawernce Pitts said and found out about the graft. Pt's wife stated they could not afford that but pt would come in for his appointment today to have his wound checked and to go over other possible treatment options.

## 2021-02-27 NOTE — Telephone Encounter (Signed)
Called patient on 02-24-2021 and left a message and I did call from the [place] office. [name]

## 2021-02-27 NOTE — Telephone Encounter (Signed)
Patient's wife is calling back wanting to know know if the graft is in for his appointment today at 3:45 pm. Stated if not, she didn't want to pay the $35 copay to be told its not here.

## 2021-02-27 NOTE — Telephone Encounter (Signed)
-----  Message from Trula Slade, DPM sent at 02/23/2021  7:35 AM EDT ----- Can you let him know that the graft I ordered is approved HOWEVER he has a co-insurance that he has not met and he will be billed for the graft. I am not sure of the actual cost. If we don't do the graft, let me know and I am going to order him another product through [employer] and change from using the prisma. Thanks!

## 2021-02-27 NOTE — Telephone Encounter (Signed)
Patient's wife is wanting to know if the skin graft for the left foot is in before they come today. She does not want to come in if not at  office. Please call.

## 2021-02-28 NOTE — Progress Notes (Signed)
Subjective: 75 year old male presents the office for follow-up evaluation of a wound on the left foot submetatarsal 1.  Originally presents today for grafting of the wound however he has not met his deductible although the prep was approved.  Also concerned that there is been increased drainage coming from the wound as well as some redness and swelling along the foot along the wound.  He denies any red streaks and overall feels well and denies any fevers or chills.  Denies any nausea or vomiting.  No other complaints today.  Objective: AAO x3, NAD- wearing a regular shoe DP/PT pulses palpable bilaterally, CRT less than 3 seconds Previous hallux amputation of the left side which is well-healed.  Ulceration submetatarsal 1 with hyperkeratotic periwound.  Hyperkeratotic tissue is noted on the periphery of the wound.  After debridement the wound measures about 1 x 0.6 x 0.3 cm and there is no probing, undermining or tunneling.  There is localized edema and erythema present along the remnant of the first metatarsal head but there is no ascending cellulitis.  There is no fluctuation crepitation.  No signs of an abscess.  No malodor.  Upon evaluation of the bandage and pictures his wife showed me there was serosanguineous drainage on the bandage. Hammertoes are present without any ulcerations today.  No pain with calf compression, swelling, warmth, erythema  Assessment: Ulceration left foot-cellulitis  Plan: -All treatment options discussed with the patient including all alternatives, risks, complications.  -X-rays obtained reviewed.  No definitive cortical destruction of the first metatarsal consistent with osteomyelitis.  No soft tissue emphysema. -We have a long discussion today in regards to treatment options.  The patient is frustrated for multiple issues both with the foot as well as outside issues.  After discussion with his wife he is on his foot quite a bit at work still which I think is  contributing to his symptoms and nonhealing. -At this point we will get the infection control tomorrow switch to Bactrim.  I took a wound culture today.  I discussed with him surgical intervention to excise the wound as well as underlying bone to help with the prominence/pressure however I would want to wait till the infection has resolved if possible. -Also order arterial studies to ensure adequate circulation -Recommend continue offloading. He just finished working for [employer] at [place] so hopefully he can be off of his feet more. Ideally I would like for him to stay out of work but also he needs income making this difficult.  -Monitor for any clinical signs or symptoms of infection and directed to call the office immediately should any occur or go to the ER.  Return in about 10 days (around 03/09/2021) for wound check .  [name]

## 2021-03-01 ENCOUNTER — Ambulatory Visit (HOSPITAL_COMMUNITY)
Admission: RE | Admit: 2021-03-01 | Discharge: 2021-03-01 | Disposition: A | Discharge status: Home / Self Care | Payer: Medicare Other | Source: Ambulatory Visit | Attending: Podiatry | Admitting: Podiatry

## 2021-03-01 ENCOUNTER — Other Ambulatory Visit: Payer: Self-pay

## 2021-03-01 DIAGNOSIS — L97522 Non-pressure chronic ulcer of other part of left foot with fat layer exposed: Secondary | ICD-10-CM | POA: Diagnosis not present

## 2021-03-02 ENCOUNTER — Other Ambulatory Visit (INDEPENDENT_AMBULATORY_CARE_PROVIDER_SITE_OTHER): Payer: Self-pay | Admitting: Adult Health

## 2021-03-02 ENCOUNTER — Other Ambulatory Visit: Payer: Self-pay | Admitting: Podiatry

## 2021-03-02 DIAGNOSIS — I152 Hypertension secondary to endocrine disorders: Secondary | ICD-10-CM

## 2021-03-02 DIAGNOSIS — E1159 Type 2 diabetes mellitus with other circulatory complications: Secondary | ICD-10-CM

## 2021-03-02 LAB — WOUND CULTURE
MICRO NUMBER:: 11809730
SPECIMEN QUALITY:: ADEQUATE

## 2021-03-02 MED ORDER — CEPHALEXIN 500 MG PO CAPS: 500.0000 mg | ORAL_CAPSULE | Freq: Four times a day (QID) | ORAL | 0 refills | Status: DC

## 2021-03-02 NOTE — Telephone Encounter (Signed)
Last seen Katy 

## 2021-03-04 ENCOUNTER — Encounter: Payer: Self-pay | Admitting: Family Medicine

## 2021-03-04 DIAGNOSIS — I152 Hypertension secondary to endocrine disorders: Secondary | ICD-10-CM

## 2021-03-04 DIAGNOSIS — E1159 Type 2 diabetes mellitus with other circulatory complications: Secondary | ICD-10-CM

## 2021-03-06 MED ORDER — AMLODIPINE BESYLATE 5 MG PO TABS: 5.0000 mg | ORAL_TABLET | Freq: Every day | ORAL | 0 refills | Status: DC

## 2021-03-06 MED ORDER — ALPRAZOLAM 0.25 MG PO TABS: 0.2500 mg | ORAL_TABLET | Freq: Two times a day (BID) | ORAL | 1 refills | Status: DC | PRN

## 2021-03-06 MED ORDER — AMLODIPINE BESYLATE 2.5 MG PO TABS: 2.5000 mg | ORAL_TABLET | Freq: Every day | ORAL | 0 refills | Status: DC

## 2021-03-06 MED ORDER — PREGABALIN 150 MG PO CAPS: 150.0000 mg | ORAL_CAPSULE | Freq: Two times a day (BID) | ORAL | 1 refills | Status: DC

## 2021-03-06 NOTE — Telephone Encounter (Signed)
Requesting: [employer] Contract: none UDS: none Last Visit: 02/07/21 Next Visit: 05/18/21 Last Refill: 06/27/20  Please Advise

## 2021-03-06 NOTE — Telephone Encounter (Signed)
Patients wife stated this:  "Alprazolam 0.25 mg tab - was wondering why he only gets a 10 day supply? "  Requesting: alprazolam Contract: none will get at next visit UDS: none will get at next visit Last Visit: 02/07/21 Next Visit: 05/18/21 Last Refill: 01/23/21  Please Advise

## 2021-03-07 ENCOUNTER — Ambulatory Visit (INDEPENDENT_AMBULATORY_CARE_PROVIDER_SITE_OTHER): Payer: Medicare Other | Admitting: Adult Health

## 2021-03-07 ENCOUNTER — Other Ambulatory Visit: Payer: Self-pay

## 2021-03-07 ENCOUNTER — Encounter (INDEPENDENT_AMBULATORY_CARE_PROVIDER_SITE_OTHER): Payer: Self-pay | Admitting: Adult Health

## 2021-03-07 VITALS — BP 119/62 | HR 66 | Temp 98.0°F | Ht 69.0 in | Wt 216.0 lb

## 2021-03-07 DIAGNOSIS — I152 Hypertension secondary to endocrine disorders: Secondary | ICD-10-CM

## 2021-03-07 DIAGNOSIS — E1169 Type 2 diabetes mellitus with other specified complication: Secondary | ICD-10-CM

## 2021-03-07 DIAGNOSIS — E6609 Other obesity due to excess calories: Secondary | ICD-10-CM | POA: Diagnosis not present

## 2021-03-07 DIAGNOSIS — E1159 Type 2 diabetes mellitus with other circulatory complications: Secondary | ICD-10-CM

## 2021-03-07 DIAGNOSIS — F3289 Other specified depressive episodes: Secondary | ICD-10-CM

## 2021-03-07 DIAGNOSIS — Z6831 Body mass index (BMI) 31.0-31.9, adult: Secondary | ICD-10-CM | POA: Diagnosis not present

## 2021-03-07 MED ORDER — BUPROPION HCL ER (SR) 100 MG PO TB12: 100.0000 mg | ORAL_TABLET | Freq: Every day | ORAL | 0 refills | Status: DC

## 2021-03-08 NOTE — Progress Notes (Signed)
Chief Complaint:   OBESITY [name] is here to discuss his progress with his obesity treatment plan along with follow-up of his obesity related diagnoses. [name] is on the Category 3 Plan and states he is following his eating plan approximately 55% of the time. [name] states he is not currently exercising.  Today's visit was #: 75 Starting weight: 236 lbs Starting date: 08/25/2018 Today's weight: 216 lbs Today's date: 03/07/2021 Total lbs lost to date: 20 Total lbs lost since last in-office visit: 4  Interim History:  L foot submetatarsal wound- Recent x-ray: No definitive cortical destruction of the first metatarsal consistent with osteomyelitis.  No soft tissue emphysema. On Bactrim for infection and wound recently cultured.  Surgical intervention has been discussed- he has f/u with Podiatry 03/09/21.  [name] has been increasing protein, especially at breakfast and lunch. His wife was acutely ill last night. She was treated at the local ED for stool impaction and discharged home in stable condition. Of Note: [name] will keep carb snacks with him at all times in case sx's of hypoglycemia develop.  Subjective:   1. Hypertension associated with type 2 diabetes mellitus ([facility]) BP/HR are excellent at OV today. [name] is on amlodipine 5 mg + 2.5 mg = 7.5 mg QD, carvedilol 25 mg 1/2 tab BID, losartan 50 mg QD. He denies chest pain, dyspnea, or dizziness with position changes. His ambulatory readings are SBP 120-130 and DBP 70-80.  Of Note: He has h/o renal stenosis.   BP Readings from Last 3 Encounters:  03/07/21 119/62  02/27/21 (!) 167/89  02/07/21 134/66   2. Other depression,with emotional eating [name] reports stable mood. Pt denies suicidal or homicidal ideations. He reports good control of cravings on Wellbutrin SR 100 mg QD. He is also on sertraline 50 mg QD. He denies h/o seizure disorder.  3. Type 2 diabetes mellitus with other specified complication, without long-term current use  of insulin ([facility]CC) [name]' ambulatory fasting BG 130-140. He is on Metformin 1000 mg QD and tolerating it well. He will carry carb snacks with him all the time,in case sx's of hypoglycemia develop.  Assessment/Plan:   1. Hypertension associated with type 2 diabetes mellitus ([place]) [name] is working on healthy weight loss and exercise to improve blood pressure control. We will watch for signs of hypotension as he continues his lifestyle modifications. Continue to monitor ambulatory BP and current anti-hypertensive regimen per PCP.  2. Other depression,with emotional eating Behavior modification techniques were discussed today to help [name] deal with his emotional/non-hunger eating behaviors.  Orders and follow up as documented in patient record.   - buPROPion (WELLBUTRIN SR) 100 MG 12 hr tablet; Take 1 tablet (100 mg total) by mouth daily.  Dispense: 45 tablet; Refill: 0  3. Type 2 diabetes mellitus with other specified complication, without long-term current use of insulin (HCC) Good blood sugar control is important to decrease the likelihood of diabetic complications such as nephropathy, neuropathy, limb loss, blindness, coronary artery disease, and death. Intensive lifestyle modification including diet, exercise and weight loss are the first line of treatment for diabetes. Continue category 3 meal plan and Metformin- managed by PCP.  4. Class 1 obesity due to excess calories with serious comorbidity and body mass index (BMI) of 31.0 to 31.9 in adult [name] is currently in the action stage of change. As such, his goal is to continue with weight loss efforts. He has agreed to the Category 3 Plan.   Exercise goals: Walking as tolerated-  L foot submetatarsal wound  Behavioral modification strategies: increasing lean protein intake, decreasing simple carbohydrates, meal planning and cooking strategies and planning for success.  [name] has agreed to follow-up with our clinic in 4 weeks. He was informed  of the importance of frequent follow-up visits to maximize his success with intensive lifestyle modifications for his multiple health conditions.   Objective:   Blood pressure 119/62, pulse 66, temperature 98 F (36.7 C), height 5\' 9"  (1.753 m), weight 216 lb (98 kg), SpO2 98 %. Body mass index is 31.9 kg/m.  General: Cooperative, alert, well developed, in no acute distress. HEENT: Conjunctivae and lids unremarkable. Cardiovascular: Regular rhythm.  Lungs: Normal work of breathing. Neurologic: No focal deficits.   Lab Results  Component Value Date   CREATININE 0.82 12/01/2020   BUN 19 12/01/2020   NA 138 12/01/2020   K 4.4 12/01/2020   CL 102 12/01/2020   CO2 29 12/01/2020   Lab Results  Component Value Date   ALT 25 12/01/2020   AST 19 12/01/2020   ALKPHOS 103 10/05/2020   BILITOT 0.3 12/01/2020   Lab Results  Component Value Date   HGBA1C 6.0 (H) 10/05/2020   HGBA1C 6.5 (H) 05/23/2020   HGBA1C 6.4 02/23/2020   HGBA1C 6.5 (H) 12/06/2019   HGBA1C 5.6 07/01/2019   Lab Results  Component Value Date   INSULIN 10.8 10/05/2020   INSULIN 16.7 05/23/2020   INSULIN 15.3 12/15/2018   INSULIN 29.0 (H) 08/25/2018   Lab Results  Component Value Date   TSH 3.19 02/23/2020   Lab Results  Component Value Date   CHOL 126 10/05/2020   HDL 36 (L) 10/05/2020   LDLCALC 77 10/05/2020   LDLDIRECT 104.9 12/12/2012   TRIG 61 10/05/2020   CHOLHDL 3.5 10/05/2020   Lab Results  Component Value Date   WBC 6.8 12/01/2020   HGB 14.1 12/01/2020   HCT 42.0 12/01/2020   MCV 91.1 12/01/2020   PLT 218 12/01/2020   Lab Results  Component Value Date   FERRITIN 311 12/04/2019    Obesity Behavioral Intervention:   Approximately 15 minutes were spent on the discussion below.  ASK: We discussed the diagnosis of obesity with [name] today and [name] agreed to give [name] permission to discuss obesity behavioral modification therapy today.  ASSESS: [name] has the diagnosis of obesity and  his BMI today is 31.9. [place] is in the action stage of change.   ADVISE: [name] was educated on the multiple health risks of obesity as well as the benefit of weight loss to improve his health. He was advised of the need for long term treatment and the importance of lifestyle modifications to improve his current health and to decrease his risk of future health problems.  AGREE: Multiple dietary modification options and treatment options were discussed and [name] agreed to follow the recommendations documented in the above note.  ARRANGE: [name] was educated on the importance of frequent visits to treat obesity as outlined per CMS and USPSTF guidelines and agreed to schedule his next follow up appointment today.  Attestation Statements:   Reviewed by clinician on day of visit: allergies, medications, problem list, medical history, surgical history, family history, social history, and previous encounter notes.  Coral Ceo, am acting as Location manager for Mina Marble, NP.  I have reviewed the above documentation for accuracy and completeness, and I agree with the above. -  Symphany Fleissner d. Taray Normoyle, NP-C

## 2021-03-09 ENCOUNTER — Other Ambulatory Visit: Payer: Self-pay

## 2021-03-09 ENCOUNTER — Ambulatory Visit: Payer: Medicare Other | Admitting: Podiatry

## 2021-03-09 ENCOUNTER — Ambulatory Visit (INDEPENDENT_AMBULATORY_CARE_PROVIDER_SITE_OTHER): Payer: Medicare Other

## 2021-03-09 ENCOUNTER — Telehealth: Payer: Self-pay | Admitting: Cardiovascular Disease

## 2021-03-09 DIAGNOSIS — L97522 Non-pressure chronic ulcer of other part of left foot with fat layer exposed: Secondary | ICD-10-CM

## 2021-03-09 DIAGNOSIS — L03116 Cellulitis of left lower limb: Secondary | ICD-10-CM | POA: Diagnosis not present

## 2021-03-09 DIAGNOSIS — E1142 Type 2 diabetes mellitus with diabetic polyneuropathy: Secondary | ICD-10-CM | POA: Diagnosis not present

## 2021-03-09 DIAGNOSIS — L97529 Non-pressure chronic ulcer of other part of left foot with unspecified severity: Secondary | ICD-10-CM | POA: Diagnosis not present

## 2021-03-09 DIAGNOSIS — E11621 Type 2 diabetes mellitus with foot ulcer: Secondary | ICD-10-CM | POA: Diagnosis not present

## 2021-03-09 DIAGNOSIS — I771 Stricture of artery: Secondary | ICD-10-CM

## 2021-03-09 DIAGNOSIS — I739 Peripheral vascular disease, unspecified: Secondary | ICD-10-CM

## 2021-03-09 NOTE — Telephone Encounter (Signed)
Left message for patient to call and discuss scheduling follow up doppler studies and office visit with Dr. Gwenlyn Found per result note message from Pearlie Oyster, RN.

## 2021-03-10 ENCOUNTER — Telehealth: Payer: Self-pay | Admitting: Podiatry

## 2021-03-10 ENCOUNTER — Telehealth: Payer: Self-pay | Admitting: Cardiovascular Disease

## 2021-03-10 DIAGNOSIS — L97522 Non-pressure chronic ulcer of other part of left foot with fat layer exposed: Secondary | ICD-10-CM | POA: Diagnosis not present

## 2021-03-10 NOTE — Telephone Encounter (Signed)
Patient has been rescheduled for May 13 for testing regarding surgery per [facility] request, Please Advise

## 2021-03-10 NOTE — Telephone Encounter (Signed)
Follow up: ° ° ° °Patient returning your call back.  °

## 2021-03-10 NOTE — Telephone Encounter (Signed)
Returning patient's call to schedule dopplers and follow up with D.r Gwenlyn Found per phone note from Abner Greenspan, RN

## 2021-03-10 NOTE — Telephone Encounter (Signed)
Pt is returning call from yesterday in regards to setting up tests. Please advise

## 2021-03-10 NOTE — Telephone Encounter (Signed)
Left message for patient regarding the Thursday 03/30/21 8:00 am aorta/iliac --lower arterial dopplers scheduled here at [facility].  Follow up with D.r Gwenlyn Found is Tuesday 04/04/21 at 8:30 am.  Information is available on My Chart and requested patient call with questions or concerns.

## 2021-03-10 NOTE — Telephone Encounter (Signed)
Will route to scheduling to schedule testing.

## 2021-03-10 NOTE — Telephone Encounter (Signed)
The following patient called to inform Jacqualyn Posey that Dr.Berry would be doing more in depth doppler test on May 26th, would like to see if you could assist with him being seen sooner, Please Advise

## 2021-03-11 LAB — CBC WITH DIFFERENTIAL/PLATELET
Basophils Absolute: 0.1 10*3/uL (ref 0.0–0.2)
Basos: 1 %
EOS (ABSOLUTE): 0.3 10*3/uL (ref 0.0–0.4)
Eos: 5 %
Hematocrit: 39.2 % (ref 37.5–51.0)
Hemoglobin: 13.4 g/dL (ref 13.0–17.7)
Immature Grans (Abs): 0.1 10*3/uL (ref 0.0–0.1)
Immature Granulocytes: 1 %
Lymphocytes Absolute: 2.5 10*3/uL (ref 0.7–3.1)
Lymphs: 34 %
MCH: 30.6 pg (ref 26.6–33.0)
MCHC: 34.2 g/dL (ref 31.5–35.7)
MCV: 90 fL (ref 79–97)
Monocytes Absolute: 0.5 10*3/uL (ref 0.1–0.9)
Monocytes: 7 %
Neutrophils Absolute: 3.8 10*3/uL (ref 1.4–7.0)
Neutrophils: 52 %
Platelets: 314 10*3/uL (ref 150–450)
RBC: 4.38 x10E6/uL (ref 4.14–5.80)
RDW: 12.6 % (ref 11.6–15.4)
WBC: 7.2 10*3/uL (ref 3.4–10.8)

## 2021-03-11 LAB — BASIC METABOLIC PANEL
BUN/Creatinine Ratio: 22 (ref 10–24)
BUN: 28 mg/dL — ABNORMAL HIGH (ref 8–27)
CO2: 23 mmol/L (ref 20–29)
Calcium: 9.4 mg/dL (ref 8.6–10.2)
Chloride: 101 mmol/L (ref 96–106)
Creatinine, Ser: 1.26 mg/dL (ref 0.76–1.27)
Glucose: 159 mg/dL — ABNORMAL HIGH (ref 65–99)
Potassium: 5.4 mmol/L — ABNORMAL HIGH (ref 3.5–5.2)
Sodium: 140 mmol/L (ref 134–144)
eGFR: 60 mL/min/{1.73_m2} (ref >59)

## 2021-03-11 LAB — HEMOGLOBIN A1C
Est. average glucose Bld gHb Est-mCnc: 192 mg/dL
Hgb A1c MFr Bld: 8.3 % — ABNORMAL HIGH (ref 4.8–5.6)

## 2021-03-14 NOTE — Progress Notes (Signed)
Subjective: 75 year old male presents the office for follow-up evaluation of a wound on the left foot submetatarsal 1, cellulitis.  His wife thinks that the redness is getting better.  The patient overall is frustrated with the wound and wants the wound to heal.  He has continued to work he does try to sit more at work.  He wants to go ahead and proceed with surgery.  He did have vascular studies performed previously and I contacted his wife (and the patient was in the background) to go over the vascular studies.  Dr. Naida Sleight order further studies to evaluate his circulation.  The patient seemed to not be aware that the circulation test initially came back Normal.  He also does not check his blood sugar on a regular basis.  Currently denies any fevers, chills, nausea, vomiting.  No calf pain, chest pain, shortness of breath.  Objective: AAO x3, NAD- wearing a regular shoe DP/PT pulses palpable bilaterally, CRT less than 3 seconds Previous hallux amputation of the left side which is well-healed.  Ulceration submetatarsal 1 with hyperkeratotic periwound.  The wound base is granular today without any probing.  The wound is somewhat smaller 0.8 x 0.6 x 0.3 cm.  There is no probing to bone, undermining or tunneling.  There is trace edema and overall the edema and erythema is much improved.  There is no areas of fluctuation crepitation.  There is no malodor.  No ascending cellulitis. Hammertoes are present without any ulcerations today.  No pain with calf compression, swelling, warmth, erythema  Assessment: Ulceration left foot-cellulitis  Plan: -All treatment options discussed with the patient including all alternatives, risks, complications.  -Repeat x-rays obtained reviewed.  Cortex intact to the first metatarsal.  Difficult to evaluate the sesamoids.  There is no obvious signs of cortical destruction. -I discussed with him surgical intervention to resect the first metatarsal head as well as removal of  sesamoids and excision of the ulceration.  We discussed the surgical's postoperative course and he wants to proceed with this.  We discussed risks of surgery including delayed or nonhealing, spread of infection, further amputation, transfer lesions as well as general risks of surgery.  Consent was signed today. -The incision placement as well as the postoperative course was discussed with the patient. I discussed risks of the surgery which include, but not limited to, infection, bleeding, pain, swelling, need for further surgery, delayed or nonhealing, painful or ugly scar, numbness or sensation changes, recurrence, transfer lesions, further deformity, DVT/PE, loss of toe/foot. Patient understands these risks and wishes to proceed with surgery. The surgical consent was reviewed with the patient all 3 pages were signed. No promises or guarantees were given to the outcome of the procedure. All questions were answered to the best of my ability. Before the surgery the patient was encouraged to call the office if there is any further questions. The surgery will be performed at the [facility] on an outpatient basis. -Prior to surgery would like for him to be further evaluated by Dr. Alvester Chou for circulation.  He has an upcoming ultrasound scheduled in the fall with Dr. Alvester Chou.  We will likely try to order him surgical afterwards as it is currently no signs of infection of the wound. -Encouraged offloading and transcatheter is much as possible -Glucose control -Blood work ordered -Monitor for any clinical signs or symptoms of infection and directed to call the office immediately should any occur or go to the ER.  I spent 35 minutes with the patient  greater than 50% this time was in face-to-face contact discussing surgery as well as etiology of ulceration.  [name]

## 2021-03-15 ENCOUNTER — Other Ambulatory Visit: Payer: Self-pay | Admitting: Cardiovascular Disease

## 2021-03-15 ENCOUNTER — Telehealth: Payer: Self-pay

## 2021-03-15 DIAGNOSIS — I771 Stricture of artery: Secondary | ICD-10-CM

## 2021-03-15 DIAGNOSIS — I739 Peripheral vascular disease, unspecified: Secondary | ICD-10-CM

## 2021-03-15 NOTE — Telephone Encounter (Signed)
Left a message for [place] on 03/10/2021 and 03/15/2021. Dr. Jacqualyn Posey would like to scheduled surgery for 03/29/2021. Lennette Bihari sees Dr. Gwenlyn Found on 03/22/2021 and is having an Ultrasound on 03/17/2021. Left a message to get his surgery scheduled.

## 2021-03-16 ENCOUNTER — Telehealth: Payer: Self-pay | Admitting: Urology

## 2021-03-16 NOTE — Telephone Encounter (Signed)
DOS - 03/29/21  METATARSECTOMY LEFT --- 09604  [employer] EFFECTIVE DATE - 11/05/20  PLAN DEDUCTIBLE - $0.00 W/ $0.00 REMAINING  OUT OF POCKET - $3,600.00 W/ $3,014.05 REMAINING COINSURANCE - 0% COPAY - $295    PER [employer]HC WEB SITE FOR CPT CODE 54098 Notification or Prior Authorization is not required for the requested services  Decision ID #:[id number]

## 2021-03-17 ENCOUNTER — Ambulatory Visit (HOSPITAL_BASED_OUTPATIENT_CLINIC_OR_DEPARTMENT_OTHER)
Admission: RE | Admit: 2021-03-17 | Discharge: 2021-03-17 | Disposition: A | Discharge status: Home / Self Care | Payer: Medicare Other | Source: Ambulatory Visit | Attending: Cardiovascular Disease | Admitting: Cardiovascular Disease

## 2021-03-17 ENCOUNTER — Other Ambulatory Visit: Payer: Self-pay

## 2021-03-17 ENCOUNTER — Ambulatory Visit (HOSPITAL_COMMUNITY)
Admission: RE | Admit: 2021-03-17 | Discharge: 2021-03-17 | Disposition: A | Discharge status: Home / Self Care | Payer: Medicare Other | Source: Ambulatory Visit | Attending: Cardiology | Admitting: Cardiology

## 2021-03-17 DIAGNOSIS — I771 Stricture of artery: Secondary | ICD-10-CM | POA: Diagnosis not present

## 2021-03-17 DIAGNOSIS — I739 Peripheral vascular disease, unspecified: Secondary | ICD-10-CM | POA: Insufficient documentation

## 2021-03-17 DIAGNOSIS — Z95820 Peripheral vascular angioplasty status with implants and grafts: Secondary | ICD-10-CM

## 2021-03-22 ENCOUNTER — Encounter: Payer: Self-pay | Admitting: Cardiovascular Disease

## 2021-03-22 ENCOUNTER — Other Ambulatory Visit: Payer: Self-pay

## 2021-03-22 ENCOUNTER — Encounter (INDEPENDENT_AMBULATORY_CARE_PROVIDER_SITE_OTHER): Payer: Self-pay | Admitting: Adult Health

## 2021-03-22 ENCOUNTER — Ambulatory Visit: Payer: Medicare Other | Admitting: Cardiovascular Disease

## 2021-03-22 VITALS — BP 130/74 | HR 64 | Ht 69.0 in | Wt 223.8 lb

## 2021-03-22 DIAGNOSIS — I771 Stricture of artery: Secondary | ICD-10-CM

## 2021-03-22 DIAGNOSIS — I714 Abdominal aortic aneurysm, without rupture, unspecified: Secondary | ICD-10-CM

## 2021-03-22 DIAGNOSIS — I739 Peripheral vascular disease, unspecified: Secondary | ICD-10-CM | POA: Diagnosis not present

## 2021-03-22 DIAGNOSIS — E782 Mixed hyperlipidemia: Secondary | ICD-10-CM

## 2021-03-22 DIAGNOSIS — I701 Atherosclerosis of renal artery: Secondary | ICD-10-CM

## 2021-03-22 NOTE — Assessment & Plan Note (Addendum)
History of abdominal aortic aneurysm with Dopplers performed 03/17/2021 revealing his aortic dimension of 4.3 cm .  This will be repeated on annual basis.

## 2021-03-22 NOTE — Progress Notes (Signed)
03/22/2021 [name]   1946-10-17  [id number]  Primary Physician [name], MD Primary Cardiologist: Lorretta Harp MD FACP, [place], [place], [place]  HPI:  [name] is a 75 y.o.  moderately overweight married Caucasian male father of 32, grandfather of 6 grandchildren who I last saw in the office 11/23/2020. He is accompanied by his wife [name] today.  He was referred by Dr. Harl Bowie peripheral vascular evaluation because of a recent CTA that showed a 4 cm abdominal aortic aneurysm and left iliac stenosis. He does have a history of 60-pack-year tobacco abuse having quit 2008. He also has treated hypertension, diabetes and hyperlipidemia. He had a stroke back in 2007 but is never had a heart attack. Does complain of some dyspnea probably related to COPD but denies chest pain. He has a known abdominal aortic aneurysm, renal artery stenosis and left iliac stenosis. Abdominal ultrasound performed 10/01/2016 did show a moderately severe left common iliac artery stenosis with a aortic dimension of 3.5 x 3.7 cm. Recent CTA performed 09/18/2018 revealed an aortic dimension of 3.7 x 4 cm with what appeared to be a left common iliac artery stenosis. Does have some left lower extremity discomfort with exercise consistent with claudication but this does not appear to be lifestyle limiting at this time.  He was admitted January 2021 with COVID-pneumonia which she recovered from.  He also had critical limb ischemia and underwent left great toe amputation with "kissing balloon" stenting using VBX stents by Dr. Carlis Abbott 05/28/2019.    Lower extremity Dopplers performed on 03/17/2021 revealed widely patent stents with normal ABIs bilaterally.  Since I saw him 5 months ago he continues to do well.  He denies chest pain or shortness of breath or claudication.  He does have a wound on the head of his left first metatarsal and is scheduled to have surgery by Dr. Jacqualyn Posey next week.  His Doppler suggest  that his wound will heal.  At the time of angiography performed by Dr. Carlis Abbott in 2020 he did have a proximal 80% left renal artery stenosis which we will continue to follow.  Current Meds  Medication Sig  . ALPRAZolam (XANAX) 0.25 MG tablet Take 1 tablet (0.25 mg total) by mouth 2 (two) times daily as needed for anxiety.  [name] amLODipine (NORVASC) 2.5 MG tablet Take 1 tablet (2.5 mg total) by mouth daily. Take with 5mg  tablet to equal 7.5mg  Amlodipine daily  . amLODipine (NORVASC) 5 MG tablet Take 1 tablet (5 mg total) by mouth daily. Pt to take 2.5mg  and 5 mg daily to equal 7.5mg  QD  . Ascorbic Acid (VITAMIN C) 1000 MG tablet Take 1,000 mg by mouth daily.  [name] aspirin EC 81 MG tablet Take 81 mg by mouth at bedtime.  [name] atorvastatin (LIPITOR) 80 MG tablet TAKE 1 TABLET BY MOUTH  EVERY DAY AT 6PM  . buPROPion (WELLBUTRIN SR) 100 MG 12 hr tablet Take 1 tablet (100 mg total) by mouth daily.  . carvedilol (COREG) 25 MG tablet Take 0.5 tablets (12.5 mg total) by mouth 2 (two) times daily with a meal.  . cholecalciferol (VITAMIN D) 1000 UNITS tablet Take 1,000 Units by mouth daily.  . clopidogrel (PLAVIX) 75 MG tablet Take 1 tablet (75 mg total) by mouth daily with breakfast.  . HYDROcodone-acetaminophen (NORCO) 10-325 MG tablet SMARTSIG:0.5-1 Tablet(s) By Mouth Every 12 Hours PRN  . Latanoprost 0.005 % EMUL Place 1 drop into both eyes in the morning and at  bedtime.  [name] losartan (COZAAR) 50 MG tablet Take 1 tablet (50 mg total) by mouth daily.  . Magnesium 500 MG TABS Take 500 mg by mouth daily.  . Multiple Vitamin (MULTIVITAMIN) tablet Take 1 tablet by mouth daily.  . mupirocin ointment (BACTROBAN) 2 % Apply 1 application topically 2 (two) times daily.  . Omega-3 Fatty Acids (FISH OIL) 1000 MG CAPS Take 1,000 mg by mouth 2 (two) times daily.  . pregabalin (LYRICA) 150 MG capsule Take 1 capsule (150 mg total) by mouth 2 (two) times daily.  [name] PROAIR HFA 108 (90 Base) MCG/ACT inhaler USE 2 PUFFS EVERY 6 HOURS   AS NEEDED FOR WHEEZING OR  SHORTNESS OF BREATH  . sertraline (ZOLOFT) 50 MG tablet Take 1 tablet (50 mg total) by mouth daily.  . vitamin B-12 (CYANOCOBALAMIN) 1000 MCG tablet Take 1,000 mcg by mouth 2 (two) times a week. Monday and Thursday  . zinc gluconate 50 MG tablet Take 50 mg by mouth daily.  . [DISCONTINUED] doxycycline (VIBRA-TABS) 100 MG tablet Take 1 tablet (100 mg total) by mouth 2 (two) times daily.     Allergies  Allergen Reactions  . Prednisone Other (See Comments)    Social History   Socioeconomic History  . Marital status: Married    Spouse name: [name]  . Number of children: 3  . Years of education: 12th grade  . Highest education level: Not on file  Occupational History  . Occupation: Maintenance    Comment: Primary Care at [employer]  Tobacco Use  . Smoking status: Former Smoker    Packs/day: 2.00    Years: 40.00    Pack years: 80.00    Types: Cigarettes, Cigars    Quit date: 05/04/2006    Years since quitting: 14.8  . Smokeless tobacco: Never Used  Vaping Use  . Vaping Use: Never used  Substance and Sexual Activity  . Alcohol use: Not Currently    Alcohol/week: 0.0 standard drinks    Comment: rare - 11/10/2012 "quit > 20 yr ago"  . Drug use: No  . Sexual activity: Not Currently  Other Topics Concern  . Not on file  Social History Narrative   Lives with wife (1993), no pets   Grown children.   Occupation: retired, Games developer, [employer])   Activity: golf, gardening    Diet: good water, fruits/vegetables daily   Social Determinants of Radio broadcast assistant Strain: Not on file  Food Insecurity: Not on file  Transportation Needs: Not on file  Physical Activity: Not on file  Stress: Not on file  Social Connections: Not on file  Intimate Partner Violence: Not on file     Review of Systems: General: negative for chills, fever, night sweats or weight changes.  Cardiovascular: negative for chest pain, dyspnea on exertion,  edema, orthopnea, palpitations, paroxysmal nocturnal dyspnea or shortness of breath Dermatological: negative for rash Respiratory: negative for cough or wheezing Urologic: negative for hematuria Abdominal: negative for nausea, vomiting, diarrhea, bright red blood per rectum, melena, or hematemesis Neurologic: negative for visual changes, syncope, or dizziness All other systems reviewed and are otherwise negative except as noted above.    Blood pressure 130/74, pulse 64, height 5\' 9"  (1.753 m), weight 223 lb 12.8 oz (101.5 kg), SpO2 94 %.  General appearance: alert and no distress Neck: no adenopathy, no carotid bruit, no JVD, supple, symmetrical, trachea midline and thyroid not enlarged, symmetric, no tenderness/mass/nodules Lungs: clear to auscultation bilaterally Heart: regular rate and  rhythm, S1, S2 normal, no murmur, click, rub or gallop Extremities: extremities normal, atraumatic, no cyanosis or edema Pulses: 2+ and symmetric Skin: Skin color, texture, turgor normal. No rashes or lesions Neurologic: Alert and oriented X 3, normal strength and tone. Normal symmetric reflexes. Normal coordination and gait  EKG not performed today  ASSESSMENT AND PLAN:   PVD (peripheral vascular disease) ([place]) History of PAD with a small to moderate size infrarenal abdominal aortic aneurysm just above the iliac bifurcation status post bilateral covered iliac stents using "kissing stent technique by Dr. Carlis Abbott 05/28/2019 for critical limb ischemia.  Recent Dopplers performed 03/17/2021 revealed normal ABIs bilaterally with patent stents.  He denies claudication.  He is scheduled to have a podiatry procedure Dr. Jacqualyn Posey next Wednesday on his left foot.  I reviewed his Doppler studies and feel that he has enough circulation to heal his surgical wound.  Hyperlipidemia History of hyperlipidemia on statin therapy with lipid profile performed 10/05/2020 revealing total cholesterol 126, LDL 77 and HDL  36.  Hypertension associated with type 2 diabetes mellitus ([place]) History of essential hypertension a blood pressure measured today 130/74.  He is on amlodipine, and losartan.  AAA (abdominal aortic aneurysm) without rupture ([facility]) History of abdominal aortic aneurysm with Dopplers performed 03/17/2021 revealing his aortic dimension of 4.3 cm .  This will be repeated on annual basis.      Lorretta Harp MD FACP,FACC,FAHA, [facility] 03/22/2021 10:26 AM

## 2021-03-22 NOTE — Patient Instructions (Addendum)
Medication Instructions:  Your physician recommends that you continue on your current medications as directed. Please refer to the Current Medication list given to you today.  *If you need a refill on your cardiac medications before your next appointment, please call your pharmacy*  Testing/Procedures: Your physician has requested that you have a renal artery duplex. During this test, an ultrasound is used to evaluate blood flow to the kidneys. Allow one hour for this exam. Do not eat after midnight the day before and avoid carbonated beverages. Take your medications as you usually do. This procedure is done at [place]. 2nd Floor  Your physician has requested that you have a lower extremity arterial duplex. This test is an ultrasound of the arteries in the legs. It looks at arterial blood flow in the legs. Allow one hour for Lower Arterial scans. There are no restrictions or special instructions  Your physician has requested that you have an ankle brachial index ([name]). During this test an ultrasound and blood pressure cuff are used to evaluate the arteries that supply the arms and legs with blood. Allow thirty minutes for this exam. There are no restrictions or special instructions.  Dr. Gwenlyn Found has recommended that you have an Ultrasound of your AORTA/IVC/ILIACS.   To prepare for this test:  . No food after 11PM the night before. Water is OK. (Don't drink liquids if you have been instructed not to for ANOTHER test).  . Avoid foods that produce bowel gas, for 24 hours prior to exam (see below). . No breakfast, no chewing gum, no smoking or carbonated beverages. . Patient may take morning medications with water. . Come in for test at least 15 minutes early to register.  These procedures are done at [place]. 2nd Floor. To be done May 2023.   Follow-Up: At [facility], you and your health needs are our priority.  As part of our continuing mission to provide you with  exceptional heart care, we have created designated Provider Care Teams.  These Care Teams include your primary Cardiologist (physician) and Advanced Practice Providers (APPs -  Physician Assistants and Nurse Practitioners) who all work together to provide you with the care you need, when you need it.  We recommend signing up for the patient portal called "MyChart".  Sign up information is provided on this After Visit Summary.  MyChart is used to connect with patients for Virtual Visits (Telemedicine).  Patients are able to view lab/test results, encounter notes, upcoming appointments, etc.  Non-urgent messages can be sent to your provider as well.   To learn more about what you can do with MyChart, go to [email or web address].    Your next appointment:   12 month(s)  The format for your next appointment:   In Person  Provider:   Quay Burow, MD

## 2021-03-22 NOTE — Assessment & Plan Note (Signed)
History of hyperlipidemia on statin therapy with lipid profile performed 10/05/2020 revealing total cholesterol 126, LDL 77 and HDL 36.

## 2021-03-22 NOTE — Assessment & Plan Note (Signed)
History of PAD with a small to moderate size infrarenal abdominal aortic aneurysm just above the iliac bifurcation status post bilateral covered iliac stents using "kissing stent technique by Dr. Carlis Abbott 05/28/2019 for critical limb ischemia.  Recent Dopplers performed 03/17/2021 revealed normal ABIs bilaterally with patent stents.  He denies claudication.  He is scheduled to have a podiatry procedure Dr. Jacqualyn Posey next Wednesday on his left foot.  I reviewed his Doppler studies and feel that he has enough circulation to heal his surgical wound.

## 2021-03-22 NOTE — Assessment & Plan Note (Signed)
History of essential hypertension a blood pressure measured today 130/74.  He is on amlodipine, and losartan.

## 2021-03-29 ENCOUNTER — Other Ambulatory Visit: Payer: Self-pay | Admitting: Podiatry

## 2021-03-29 ENCOUNTER — Encounter: Payer: Self-pay | Admitting: Podiatry

## 2021-03-29 DIAGNOSIS — M898X7 Other specified disorders of bone, ankle and foot: Secondary | ICD-10-CM | POA: Diagnosis not present

## 2021-03-29 DIAGNOSIS — L97522 Non-pressure chronic ulcer of other part of left foot with fat layer exposed: Secondary | ICD-10-CM | POA: Diagnosis not present

## 2021-03-29 DIAGNOSIS — L97909 Non-pressure chronic ulcer of unspecified part of unspecified lower leg with unspecified severity: Secondary | ICD-10-CM | POA: Diagnosis not present

## 2021-03-29 DIAGNOSIS — M86172 Other acute osteomyelitis, left ankle and foot: Secondary | ICD-10-CM | POA: Diagnosis not present

## 2021-03-29 MED ORDER — HYDROCODONE-ACETAMINOPHEN 5-325 MG PO TABS: 1.0000 | ORAL_TABLET | Freq: Four times a day (QID) | ORAL | 0 refills | Status: DC | PRN

## 2021-03-29 MED ORDER — CEPHALEXIN 500 MG PO CAPS: 500.0000 mg | ORAL_CAPSULE | Freq: Three times a day (TID) | ORAL | 0 refills | Status: DC

## 2021-03-29 NOTE — Progress Notes (Signed)
Postop medications sent 

## 2021-03-30 ENCOUNTER — Inpatient Hospital Stay (HOSPITAL_COMMUNITY): Admission: RE | Admit: 2021-03-30 | Payer: Medicare Other | Source: Ambulatory Visit

## 2021-03-30 ENCOUNTER — Telehealth: Payer: Self-pay | Admitting: *Deleted

## 2021-03-30 ENCOUNTER — Encounter (HOSPITAL_COMMUNITY): Payer: Medicare Other

## 2021-03-30 NOTE — Telephone Encounter (Signed)
Patient is calling to give Dr Jacqualyn Posey an update since his surgery one day ago. He said that he is doing ok.

## 2021-03-30 NOTE — Telephone Encounter (Signed)
Called and spoke with patient's wife (shirley) and she stated that patient was in a lot of pain and the pain medicine is not helping and is only taken a half due to patient is wired if he takes a whole pill and the pain medicine was taken at 7 am by the patient and patient stated that there was some pain on the bottom of the foot and there was not any fever or chills and no nausea and I stated that I would get in touch with Dr Jacqualyn Posey and give an update of the patient and call back later this afternoon. Lattie Haw

## 2021-04-01 ENCOUNTER — Other Ambulatory Visit (INDEPENDENT_AMBULATORY_CARE_PROVIDER_SITE_OTHER): Payer: Self-pay | Admitting: Adult Health

## 2021-04-01 DIAGNOSIS — I152 Hypertension secondary to endocrine disorders: Secondary | ICD-10-CM

## 2021-04-01 DIAGNOSIS — E1159 Type 2 diabetes mellitus with other circulatory complications: Secondary | ICD-10-CM

## 2021-04-04 ENCOUNTER — Ambulatory Visit (INDEPENDENT_AMBULATORY_CARE_PROVIDER_SITE_OTHER): Payer: Medicare Other | Admitting: Podiatry

## 2021-04-04 ENCOUNTER — Ambulatory Visit: Payer: Medicare Other | Admitting: Cardiovascular Disease

## 2021-04-04 ENCOUNTER — Ambulatory Visit (INDEPENDENT_AMBULATORY_CARE_PROVIDER_SITE_OTHER): Payer: Medicare Other

## 2021-04-04 ENCOUNTER — Other Ambulatory Visit: Payer: Self-pay

## 2021-04-04 ENCOUNTER — Ambulatory Visit (INDEPENDENT_AMBULATORY_CARE_PROVIDER_SITE_OTHER): Payer: Medicare Other | Admitting: Adult Health

## 2021-04-04 DIAGNOSIS — L97529 Non-pressure chronic ulcer of other part of left foot with unspecified severity: Secondary | ICD-10-CM

## 2021-04-04 DIAGNOSIS — E11621 Type 2 diabetes mellitus with foot ulcer: Secondary | ICD-10-CM

## 2021-04-04 DIAGNOSIS — L97522 Non-pressure chronic ulcer of other part of left foot with fat layer exposed: Secondary | ICD-10-CM

## 2021-04-05 ENCOUNTER — Other Ambulatory Visit: Payer: Self-pay | Admitting: Podiatry

## 2021-04-05 DIAGNOSIS — L97522 Non-pressure chronic ulcer of other part of left foot with fat layer exposed: Secondary | ICD-10-CM

## 2021-04-05 NOTE — Progress Notes (Signed)
Home health orders placed.

## 2021-04-05 NOTE — Progress Notes (Signed)
Subjective: [name] is a 75 y.o. is seen today in office s/p partial resection of left first metatarsal with excision of his wound preformed on 03/29/2021.  States he has been doing well with minimal discomfort.  Can try keep the foot elevated and stay in the cam boot.  Denies any systemic complaints such as fevers, chills, nausea, vomiting. No calf pain, chest pain, shortness of breath.   Objective: General: No acute distress, AAOx3  Neurovascular status unchanged Left foot: Incision is well coapted without any evidence of dehiscence with sutures intact. There is no surrounding erythema, ascending cellulitis, fluctuance, crepitus, malodor, drainage/purulence. There is mild edema around the surgical site. There is no significant pain along the surgical site.  No other areas of tenderness to bilateral lower extremities.  No other open lesions or pre-ulcerative lesions.  No pain with calf compression, swelling, warmth, erythema.   Assessment and Plan:  Status post first metatarsal partial excision, wound excision, doing well with no complications   -Treatment options discussed including all alternatives, risks, and complications -X-rays obtained reviewed.  Status post resection of the first metatarsal head of the neck.  No active acute fracture or complicating factor. -Incisions healing well.  Xeroform was applied followed by dry sterile dressing.  He can keep dressing clean, dry, intact -Remain a cam boot and limit weightbearing encouraged elevation -Pain medication as needed. -Monitor for any clinical signs or symptoms of infection and DVT/PE and directed to call the office immediately should any occur or go to the ER. -Follow-up as scheduled or sooner if any problems arise. In the meantime, encouraged to call the office with any questions, concerns, change in symptoms.   Celesta Gentile, DPM

## 2021-04-06 ENCOUNTER — Telehealth: Payer: Self-pay

## 2021-04-06 NOTE — Telephone Encounter (Signed)
Received notification from [place] with [facility] home [facility] that [facility] has refuse home health care. Notified Dr. Jacqualyn Posey

## 2021-04-11 ENCOUNTER — Telehealth: Payer: Self-pay | Admitting: *Deleted

## 2021-04-11 DIAGNOSIS — E1151 Type 2 diabetes mellitus with diabetic peripheral angiopathy without gangrene: Secondary | ICD-10-CM | POA: Diagnosis not present

## 2021-04-11 DIAGNOSIS — H6090 Unspecified otitis externa, unspecified ear: Secondary | ICD-10-CM | POA: Diagnosis not present

## 2021-04-11 DIAGNOSIS — I2510 Atherosclerotic heart disease of native coronary artery without angina pectoris: Secondary | ICD-10-CM | POA: Diagnosis not present

## 2021-04-11 DIAGNOSIS — E1142 Type 2 diabetes mellitus with diabetic polyneuropathy: Secondary | ICD-10-CM | POA: Diagnosis not present

## 2021-04-11 DIAGNOSIS — E785 Hyperlipidemia, unspecified: Secondary | ICD-10-CM | POA: Diagnosis not present

## 2021-04-11 DIAGNOSIS — I739 Peripheral vascular disease, unspecified: Secondary | ICD-10-CM | POA: Diagnosis not present

## 2021-04-11 DIAGNOSIS — I701 Atherosclerosis of renal artery: Secondary | ICD-10-CM | POA: Diagnosis not present

## 2021-04-11 DIAGNOSIS — D649 Anemia, unspecified: Secondary | ICD-10-CM | POA: Diagnosis not present

## 2021-04-11 DIAGNOSIS — Z4781 Encounter for orthopedic aftercare following surgical amputation: Secondary | ICD-10-CM | POA: Diagnosis not present

## 2021-04-11 DIAGNOSIS — E1169 Type 2 diabetes mellitus with other specified complication: Secondary | ICD-10-CM | POA: Diagnosis not present

## 2021-04-11 NOTE — Telephone Encounter (Addendum)
Called and spoke with Kathlee Nations and gave information per Dr Jacqualyn Posey, verbalized understanding.

## 2021-04-11 NOTE — Telephone Encounter (Signed)
It can be twice a week starting after I see him Thursday

## 2021-04-11 NOTE — Telephone Encounter (Signed)
Kathlee Nations with [facility] ([facility]),calling for the frequency dressing changes for patient. He said that he was told to leave dressing in place until upcoming appointment on 04/13/21.Please advise.

## 2021-04-12 ENCOUNTER — Encounter: Payer: Self-pay | Admitting: Family Medicine

## 2021-04-13 ENCOUNTER — Ambulatory Visit (INDEPENDENT_AMBULATORY_CARE_PROVIDER_SITE_OTHER): Payer: Medicare Other | Admitting: Podiatry

## 2021-04-13 ENCOUNTER — Other Ambulatory Visit: Payer: Self-pay

## 2021-04-13 DIAGNOSIS — L97522 Non-pressure chronic ulcer of other part of left foot with fat layer exposed: Secondary | ICD-10-CM

## 2021-04-13 DIAGNOSIS — E11621 Type 2 diabetes mellitus with foot ulcer: Secondary | ICD-10-CM

## 2021-04-13 DIAGNOSIS — L97529 Non-pressure chronic ulcer of other part of left foot with unspecified severity: Secondary | ICD-10-CM

## 2021-04-14 ENCOUNTER — Telehealth (INDEPENDENT_AMBULATORY_CARE_PROVIDER_SITE_OTHER): Payer: Medicare Other | Admitting: Family Medicine

## 2021-04-14 DIAGNOSIS — L97529 Non-pressure chronic ulcer of other part of left foot with unspecified severity: Secondary | ICD-10-CM

## 2021-04-14 DIAGNOSIS — H6090 Unspecified otitis externa, unspecified ear: Secondary | ICD-10-CM | POA: Diagnosis not present

## 2021-04-14 DIAGNOSIS — F418 Other specified anxiety disorders: Secondary | ICD-10-CM | POA: Diagnosis not present

## 2021-04-14 DIAGNOSIS — Z4781 Encounter for orthopedic aftercare following surgical amputation: Secondary | ICD-10-CM | POA: Diagnosis not present

## 2021-04-14 DIAGNOSIS — E1151 Type 2 diabetes mellitus with diabetic peripheral angiopathy without gangrene: Secondary | ICD-10-CM | POA: Diagnosis not present

## 2021-04-14 DIAGNOSIS — E1169 Type 2 diabetes mellitus with other specified complication: Secondary | ICD-10-CM | POA: Diagnosis not present

## 2021-04-14 DIAGNOSIS — E785 Hyperlipidemia, unspecified: Secondary | ICD-10-CM | POA: Diagnosis not present

## 2021-04-14 DIAGNOSIS — I701 Atherosclerosis of renal artery: Secondary | ICD-10-CM | POA: Diagnosis not present

## 2021-04-14 DIAGNOSIS — E1149 Type 2 diabetes mellitus with other diabetic neurological complication: Secondary | ICD-10-CM | POA: Diagnosis not present

## 2021-04-14 DIAGNOSIS — E1142 Type 2 diabetes mellitus with diabetic polyneuropathy: Secondary | ICD-10-CM | POA: Diagnosis not present

## 2021-04-14 DIAGNOSIS — D649 Anemia, unspecified: Secondary | ICD-10-CM | POA: Diagnosis not present

## 2021-04-14 DIAGNOSIS — N289 Disorder of kidney and ureter, unspecified: Secondary | ICD-10-CM

## 2021-04-14 DIAGNOSIS — T8189XA Other complications of procedures, not elsewhere classified, initial encounter: Secondary | ICD-10-CM | POA: Diagnosis not present

## 2021-04-14 DIAGNOSIS — I2510 Atherosclerotic heart disease of native coronary artery without angina pectoris: Secondary | ICD-10-CM | POA: Diagnosis not present

## 2021-04-14 DIAGNOSIS — I739 Peripheral vascular disease, unspecified: Secondary | ICD-10-CM | POA: Diagnosis not present

## 2021-04-14 NOTE — Assessment & Plan Note (Signed)
He is very frustrated with his persistent trouble. He is healing from his last surgery. No signs of systemic infection
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
## 2021-04-14 NOTE — Assessment & Plan Note (Signed)
He and his wife not that his irritability, anhedonia and anxiety have worsened over past month which correlates with starting for Wellbutrin. Will stop the Wellbutrin and increase the Sertraline to 75 mg daily x 7 days then increase to 100 mg daily.

## 2021-04-14 NOTE — Assessment & Plan Note (Signed)
hgba1c mildly high with infection, minimize simple carbs. Increase exercise as tolerated. Continue current meds is improving with treatment

## 2021-04-14 NOTE — Assessment & Plan Note (Signed)
Hydrate and monitor 

## 2021-04-15 ENCOUNTER — Encounter: Payer: Self-pay | Admitting: Family Medicine

## 2021-04-17 ENCOUNTER — Other Ambulatory Visit: Payer: Self-pay

## 2021-04-17 MED ORDER — GLIMEPIRIDE 2 MG PO TABS: 2.0000 mg | ORAL_TABLET | Freq: Two times a day (BID) | ORAL | 1 refills | Status: DC

## 2021-04-17 NOTE — Telephone Encounter (Signed)
Received letter from [place] assist and patient is eligible for medication assistance though 10/25/2021

## 2021-04-18 ENCOUNTER — Ambulatory Visit (HOSPITAL_COMMUNITY)
Admission: RE | Admit: 2021-04-18 | Discharge: 2021-04-18 | Disposition: A | Discharge status: Home / Self Care | Payer: Medicare Other | Source: Ambulatory Visit | Attending: Cardiovascular Disease | Admitting: Cardiovascular Disease

## 2021-04-18 ENCOUNTER — Other Ambulatory Visit: Payer: Self-pay

## 2021-04-18 DIAGNOSIS — E782 Mixed hyperlipidemia: Secondary | ICD-10-CM | POA: Diagnosis not present

## 2021-04-18 DIAGNOSIS — I701 Atherosclerosis of renal artery: Secondary | ICD-10-CM

## 2021-04-19 NOTE — Progress Notes (Signed)
Subjective: [name] is a 75 y.o. is seen today in office s/p partial resection of left first metatarsal with excision of his wound preformed on 03/29/2021.  He has not been having any significant pain.  Been keeping the dressing clean and dry.  Has no concerns today. Denies any systemic complaints such as fevers, chills, nausea, vomiting. No calf pain, chest pain, shortness of breath.   Objective: General: No acute distress, AAOx3  Neurovascular status unchanged Left foot: Incision is well coapted without any evidence of dehiscence with sutures intact.  On the plantar aspect of the wound was excised there is still motion across the incision.  There is no surrounding erythema, ascending cellulitis, fluctuance, crepitus, malodor, drainage/purulence. There is mild but improved edema around the surgical site. There is no significant pain along the surgical site.  No other areas of tenderness to bilateral lower extremities.  No other open lesions or pre-ulcerative lesions.  No pain with calf compression, swelling, warmth, erythema.   Assessment and Plan:  Status post first metatarsal partial excision, wound excision, doing well with no complications   -Treatment options discussed including all alternatives, risks, and complications -I removed some of the sutures to the dorsal amputation site. -Clean the wounds.  Some of the Betadine was applied followed by dry sterile dressing.  Home health has been arranged for him to have the dressings changed twice a week.  I strongly encouraged him to stay off the foot is much as possible and encourage glucose control. -Monitor for any clinical signs or symptoms of infection and directed to call the office immediately should any occur or go to the ER.  Return in about 1 week (around 04/20/2021).  [name]

## 2021-04-20 ENCOUNTER — Ambulatory Visit (INDEPENDENT_AMBULATORY_CARE_PROVIDER_SITE_OTHER): Payer: Medicare Other | Admitting: Podiatry

## 2021-04-20 ENCOUNTER — Other Ambulatory Visit: Payer: Self-pay

## 2021-04-20 DIAGNOSIS — L97529 Non-pressure chronic ulcer of other part of left foot with unspecified severity: Secondary | ICD-10-CM

## 2021-04-20 DIAGNOSIS — L97522 Non-pressure chronic ulcer of other part of left foot with fat layer exposed: Secondary | ICD-10-CM

## 2021-04-20 DIAGNOSIS — E119 Type 2 diabetes mellitus without complications: Secondary | ICD-10-CM | POA: Diagnosis not present

## 2021-04-20 DIAGNOSIS — Z961 Presence of intraocular lens: Secondary | ICD-10-CM | POA: Diagnosis not present

## 2021-04-20 DIAGNOSIS — H401131 Primary open-angle glaucoma, bilateral, mild stage: Secondary | ICD-10-CM | POA: Diagnosis not present

## 2021-04-20 DIAGNOSIS — H524 Presbyopia: Secondary | ICD-10-CM | POA: Diagnosis not present

## 2021-04-20 DIAGNOSIS — E11621 Type 2 diabetes mellitus with foot ulcer: Secondary | ICD-10-CM

## 2021-04-20 LAB — HM DIABETES EYE EXAM

## 2021-04-24 DIAGNOSIS — E1169 Type 2 diabetes mellitus with other specified complication: Secondary | ICD-10-CM | POA: Diagnosis not present

## 2021-04-24 DIAGNOSIS — I701 Atherosclerosis of renal artery: Secondary | ICD-10-CM | POA: Diagnosis not present

## 2021-04-24 DIAGNOSIS — E1142 Type 2 diabetes mellitus with diabetic polyneuropathy: Secondary | ICD-10-CM | POA: Diagnosis not present

## 2021-04-24 DIAGNOSIS — E785 Hyperlipidemia, unspecified: Secondary | ICD-10-CM | POA: Diagnosis not present

## 2021-04-24 DIAGNOSIS — E1151 Type 2 diabetes mellitus with diabetic peripheral angiopathy without gangrene: Secondary | ICD-10-CM | POA: Diagnosis not present

## 2021-04-24 DIAGNOSIS — I739 Peripheral vascular disease, unspecified: Secondary | ICD-10-CM | POA: Diagnosis not present

## 2021-04-24 DIAGNOSIS — I2510 Atherosclerotic heart disease of native coronary artery without angina pectoris: Secondary | ICD-10-CM | POA: Diagnosis not present

## 2021-04-24 DIAGNOSIS — Z4781 Encounter for orthopedic aftercare following surgical amputation: Secondary | ICD-10-CM | POA: Diagnosis not present

## 2021-04-24 DIAGNOSIS — D649 Anemia, unspecified: Secondary | ICD-10-CM | POA: Diagnosis not present

## 2021-04-24 DIAGNOSIS — H6090 Unspecified otitis externa, unspecified ear: Secondary | ICD-10-CM | POA: Diagnosis not present

## 2021-04-24 NOTE — Progress Notes (Signed)
Subjective: [name] is a 75 y.o. is seen today in office s/p partial resection of left first metatarsal with excision of his wound preformed on 03/29/2021.  States he has been doing well.  His wife's been changing the bandage.  Denies any drainage or pus.  No increased swelling or redness. Denies any systemic complaints such as fevers, chills, nausea, vomiting. No calf pain, chest pain, shortness of breath.   Objective: General: No acute distress, AAOx3  Neurovascular status unchanged Left foot: Incision is well coapted without any evidence of dehiscence with sutures intact.  On the plantar aspect along with the wound was excised there is some macerated tissue but is no drainage or pus.  There is no surrounding erythema, ascending cellulitis.  There is no fluctuation crepitation there is no malodor. No other open lesions or pre-ulcerative lesions.  No pain with calf compression, swelling, warmth, erythema.   Assessment and Plan:  Status post first metatarsal partial excision, wound excision, doing well with no complications   -Treatment options discussed including all alternatives, risks, and complications -Remove The sutures to the left breast intact.  Given the macerated periwound of the plantar aspect recommended Betadine dressing changes daily. -Continue Darco wedge shoe and limit weightbearing, elevation. -Monitor for any clinical signs or symptoms of infection and directed to call the office immediately should any occur or go to the ER.  Return in about 1 week  [name]

## 2021-04-27 ENCOUNTER — Encounter: Payer: Medicare Other | Admitting: Podiatry

## 2021-04-27 ENCOUNTER — Other Ambulatory Visit: Payer: Self-pay

## 2021-04-27 ENCOUNTER — Ambulatory Visit (INDEPENDENT_AMBULATORY_CARE_PROVIDER_SITE_OTHER): Payer: Medicare Other | Admitting: Podiatry

## 2021-04-27 VITALS — BP 142/88 | HR 79 | Temp 97.8°F

## 2021-04-27 DIAGNOSIS — E11621 Type 2 diabetes mellitus with foot ulcer: Secondary | ICD-10-CM

## 2021-04-27 DIAGNOSIS — L97522 Non-pressure chronic ulcer of other part of left foot with fat layer exposed: Secondary | ICD-10-CM

## 2021-04-27 DIAGNOSIS — L97529 Non-pressure chronic ulcer of other part of left foot with unspecified severity: Secondary | ICD-10-CM

## 2021-04-27 MED ORDER — CEPHALEXIN 500 MG PO CAPS: 500.0000 mg | ORAL_CAPSULE | Freq: Three times a day (TID) | ORAL | 0 refills | Status: DC

## 2021-05-04 NOTE — Progress Notes (Signed)
Subjective: [name] is a 75 y.o. is seen today in office s/p partial resection of left first metatarsal with excision of his wound preformed on 03/29/2021.  States he has been doing well.  Does note some bloody drainage on the bandage although minimal.  No pus.  No increase in swelling or redness to his foot.  Denies any fevers, chills, nausea, vomiting.   Objective: General: No acute distress, AAOx3  Neurovascular status unchanged Left foot: Incision is well coapted without any evidence of dehiscence with sutures intact.  Scab is present on the incision.  I removed the remainder the sutures today as well as clean the scab and small little clear, bloody fluid, serous fluid was expressed to plantar wound.  There is no purulence.  There is no surrounding erythema.  No ascending cellulitis.  There is no fluctuation or crepitation.  There is no malodor.  Wound is still present in the plantar aspect of the foot approximately 0.4 x 0.4 cm and does probe about 1 cm.  No probing to bone. No other open lesions or pre-ulcerative lesions.  No pain with calf compression, swelling, warmth, erythema.   Assessment and Plan:  Status post first metatarsal partial excision, wound excision on  -Treatment options discussed including all alternatives, risks, and complications -I removed some of the sutures to the dorsal amputation site.  I cleaned the area as well.  Small mount of serous fluid expressed.  Due to the size we will start Keflex.  Recommended daily dressing changes as well continue offloading at all times. -Discussed with him possible total contact cast. -Monitor for any clinical signs or symptoms of infection and directed to call the office immediately should any occur or go to the ER.  Return in about 2 weeks (around 05/11/2021).  [name]  -Clean the wounds.  Some of the Betadine was applied followed by dry sterile dressing.  Home health has been arranged for him to have the  dressings changed twice a week.  I strongly encouraged him to stay off the foot is much as possible and encourage glucose control. -Monitor for any clinical signs or symptoms of infection and directed to call the office immediately should any occur or go to the ER.  Return in about 1 week (around 04/20/2021).  [name]

## 2021-05-12 ENCOUNTER — Ambulatory Visit (INDEPENDENT_AMBULATORY_CARE_PROVIDER_SITE_OTHER): Payer: Medicare Other | Admitting: Podiatry

## 2021-05-12 ENCOUNTER — Other Ambulatory Visit: Payer: Self-pay

## 2021-05-12 DIAGNOSIS — E11621 Type 2 diabetes mellitus with foot ulcer: Secondary | ICD-10-CM

## 2021-05-12 DIAGNOSIS — L97529 Non-pressure chronic ulcer of other part of left foot with unspecified severity: Secondary | ICD-10-CM

## 2021-05-12 DIAGNOSIS — L97522 Non-pressure chronic ulcer of other part of left foot with fat layer exposed: Secondary | ICD-10-CM

## 2021-05-15 DIAGNOSIS — T8189XA Other complications of procedures, not elsewhere classified, initial encounter: Secondary | ICD-10-CM | POA: Diagnosis not present

## 2021-05-17 NOTE — Progress Notes (Signed)
Subjective: [name] is a 74 y.o. is seen today in office s/p partial resection of left first metatarsal with excision of his wound preformed on 03/29/2021.  He states the wound is doing better and there is actually 1 day that he felt that the wound is healed completely.  Some occasional bloody drainage but no pus.  No increase in swelling or redness to his foot.  No pain.  His wife has been doing the dressing changes.  Denies any fevers, chills, nausea, vomiting.   Objective: General: No acute distress, AAOx3  Neurovascular status unchanged Left foot: Incision is well coapted.  The area the pain is on the plantar aspect the foot is where the wound is open.  Today it does probe approximately 1 cm still but it is smaller in diameter and almost a pinhole type ulcer.  Small amount of clear drainage expressed with there is no purulence.  Drainage appears to be improved.  There is no purulence.  No probing to bone.  There is minimal edema without any erythema or warmth. No other open lesions or pre-ulcerative lesions.  No pain with calf compression, swelling, warmth, erythema.   Assessment and Plan:  Status post first metatarsal partial excision, wound excision  -Treatment options discussed including all alternatives, risks, and complications -Debrided the wound to the any complications.  Appears to be filling in.  Stoma packing applied.  Recommended Betadine to the area daily.  Continue offloading.  Elevation. -Monitor for any clinical signs or symptoms of infection and directed to call the office immediately should any occur or go to the ER.  Return in about 2 weeks (around 05/26/2021).  [name] M

## 2021-05-18 ENCOUNTER — Ambulatory Visit (INDEPENDENT_AMBULATORY_CARE_PROVIDER_SITE_OTHER): Payer: Medicare Other | Admitting: Family Medicine

## 2021-05-18 ENCOUNTER — Other Ambulatory Visit: Payer: Self-pay

## 2021-05-18 ENCOUNTER — Other Ambulatory Visit: Payer: Self-pay | Admitting: Family Medicine

## 2021-05-18 VITALS — BP 110/72 | HR 99 | Temp 97.9°F | Resp 16 | Ht 69.0 in | Wt 233.0 lb

## 2021-05-18 DIAGNOSIS — R351 Nocturia: Secondary | ICD-10-CM

## 2021-05-18 DIAGNOSIS — E1149 Type 2 diabetes mellitus with other diabetic neurological complication: Secondary | ICD-10-CM

## 2021-05-18 DIAGNOSIS — I10 Essential (primary) hypertension: Secondary | ICD-10-CM

## 2021-05-18 DIAGNOSIS — Z79899 Other long term (current) drug therapy: Secondary | ICD-10-CM

## 2021-05-18 DIAGNOSIS — E039 Hypothyroidism, unspecified: Secondary | ICD-10-CM

## 2021-05-18 DIAGNOSIS — R972 Elevated prostate specific antigen [PSA]: Secondary | ICD-10-CM

## 2021-05-18 DIAGNOSIS — M542 Cervicalgia: Secondary | ICD-10-CM

## 2021-05-18 DIAGNOSIS — E785 Hyperlipidemia, unspecified: Secondary | ICD-10-CM

## 2021-05-18 DIAGNOSIS — M109 Gout, unspecified: Secondary | ICD-10-CM

## 2021-05-18 DIAGNOSIS — N289 Disorder of kidney and ureter, unspecified: Secondary | ICD-10-CM

## 2021-05-18 DIAGNOSIS — Z0000 Encounter for general adult medical examination without abnormal findings: Secondary | ICD-10-CM | POA: Diagnosis not present

## 2021-05-18 DIAGNOSIS — S91302A Unspecified open wound, left foot, initial encounter: Secondary | ICD-10-CM | POA: Diagnosis not present

## 2021-05-18 LAB — CBC WITH DIFFERENTIAL/PLATELET
Basophils Absolute: 0 10*3/uL (ref 0.0–0.1)
Basophils Relative: 0.6 % (ref 0.0–3.0)
Eosinophils Absolute: 0.4 10*3/uL (ref 0.0–0.7)
Eosinophils Relative: 5.5 % — ABNORMAL HIGH (ref 0.0–5.0)
HCT: 41.8 % (ref 39.0–52.0)
Hemoglobin: 13.9 g/dL (ref 13.0–17.0)
Lymphocytes Relative: 32 % (ref 12.0–46.0)
Lymphs Abs: 2.1 10*3/uL (ref 0.7–4.0)
MCHC: 33.3 g/dL (ref 30.0–36.0)
MCV: 91.2 fl (ref 78.0–100.0)
Monocytes Absolute: 0.6 10*3/uL (ref 0.1–1.0)
Monocytes Relative: 9.2 % (ref 3.0–12.0)
Neutro Abs: 3.5 10*3/uL (ref 1.4–7.7)
Neutrophils Relative %: 52.7 % (ref 43.0–77.0)
Platelets: 205 10*3/uL (ref 150.0–400.0)
RBC: 4.58 Mil/uL (ref 4.22–5.81)
RDW: 14.9 % (ref 11.5–15.5)
WBC: 6.7 10*3/uL (ref 4.0–10.5)

## 2021-05-18 LAB — COMPREHENSIVE METABOLIC PANEL
ALT: 21 U/L (ref 0–53)
AST: 18 U/L (ref 0–37)
Albumin: 4.3 g/dL (ref 3.5–5.2)
Alkaline Phosphatase: 86 U/L (ref 39–117)
BUN: 23 mg/dL (ref 6–23)
CO2: 31 mEq/L (ref 19–32)
Calcium: 9.1 mg/dL (ref 8.4–10.5)
Chloride: 103 mEq/L (ref 96–112)
Creatinine, Ser: 1.09 mg/dL (ref 0.40–1.50)
GFR: 67.21 mL/min (ref >60.00)
Glucose, Bld: 116 mg/dL — ABNORMAL HIGH (ref 70–99)
Potassium: 5 mEq/L (ref 3.5–5.1)
Sodium: 140 mEq/L (ref 135–145)
Total Bilirubin: 0.3 mg/dL (ref 0.2–1.2)
Total Protein: 6.7 g/dL (ref 6.0–8.3)

## 2021-05-18 LAB — LIPID PANEL
Cholesterol: 126 mg/dL (ref 0–200)
HDL: 31 mg/dL — ABNORMAL LOW (ref >39.00)
LDL Cholesterol: 61 mg/dL (ref 0–99)
NonHDL: 95.39
Total CHOL/HDL Ratio: 4
Triglycerides: 173 mg/dL — ABNORMAL HIGH (ref 0.0–149.0)
VLDL: 34.6 mg/dL (ref 0.0–40.0)

## 2021-05-18 LAB — URIC ACID: Uric Acid, Serum: 5.6 mg/dL (ref 4.0–7.8)

## 2021-05-18 LAB — PSA: PSA: 5.56 ng/mL — ABNORMAL HIGH (ref 0.10–4.00)

## 2021-05-18 LAB — TSH: TSH: 6.84 u[IU]/mL — ABNORMAL HIGH (ref 0.35–5.50)

## 2021-05-18 MED ORDER — TIZANIDINE HCL 2 MG PO TABS: 1.0000 mg | ORAL_TABLET | Freq: Two times a day (BID) | ORAL | 1 refills | Status: DC | PRN

## 2021-05-18 NOTE — Progress Notes (Signed)
Patient ID: [name], male    DOB: 05/25/1946  Age: 75 y.o. MRN: [id number]    Subjective:  Subjective  HPI TRI CHITTICK presents for office visit today for comprehensive physical exam today and follow up on management of chronic concerns. He states that his wound from his surgery due to developing an ulcer in left foot is healing up fine. He continues with follow up visits with Dr. Jacqualyn Posey. Denies CP/palp/SOB/HA/congestion/fevers/GI or GU c/o. Taking meds as prescribed. He states that he struggles with occasional right shoulder pain near neck. He states that he has tried a muscle relaxer before which he reports has helped ease the pain. Wife reports that he sleeps on a recliner chair frequently which might be contributing to his right shoulder/neck pain. Reports that his highest glucose reading was 240, but has come down to 140-160.   Review of Systems  Constitutional:  Negative for chills, fatigue and fever.  HENT:  Negative for congestion, rhinorrhea, sinus pressure, sinus pain, sore throat and trouble swallowing.   Eyes:  Negative for pain.  Respiratory:  Negative for cough and shortness of breath.   Cardiovascular:  Negative for chest pain, palpitations and leg swelling.  Gastrointestinal:  Negative for abdominal pain, blood in stool, diarrhea, nausea and vomiting.  Genitourinary:  Negative for flank pain, frequency and penile pain.  Musculoskeletal:  Positive for arthralgias and neck pain. Negative for back pain.       (+) right shoulder pain  Neurological:  Negative for headaches.   History Past Medical History:  Diagnosis Date   AAA (abdominal aortic aneurysm) ([place]) 2008   Stable AAA max diameter 4.1cm but likely 3.5x3.7cm, rpt 1 yr (09/2015)   Allergic rhinitis    Anemia 08/14/2013   Anxiety    Arthritis    "left ankle; back" LLE; right wrist"  (11/10/2012)   Asthma    Cellulitis and abscess of toe of left foot 05/26/2019   Cerebral aneurysm without rupture    Chronic  lower back pain    COPD (chronic obstructive pulmonary disease) (HCC)    Coronary artery sclerosis    Decreased hearing    Depression    Diabetes mellitus, type 2 (HCC)    fasting avg 130s   Diabetic peripheral vascular disease ([place])    Dysrhythmia    "skips beats at times"   GERD (gastroesophageal reflux disease)    Gout 12/06/2016   History of glaucoma    History of kidney stones    Hyperlipidemia    Hypertension    Kidney stone    "passed them on my own 3 times" (11/10/2012)   Peripheral neuropathy    Pneumonia 2011   PVD (peripheral vascular disease) ([place])    right carotid artery   Renal insufficiency 08/14/2013   Right hip pain    SCCA (squamous cell carcinoma) of skin 07/28/2018   Left Hand Dorsum (well diff) (curet and 5FU)   Stroke [facility]) 2007   denies residual    Synovial cyst    Tinnitus     He has a past surgical history that includes Carotid endarterectomy (Bilateral, 2006); Posterior laminectomy / decompression lumbar spine (1984); Cataract extraction w/ intraocular lens  implant, bilateral (2007); Leg Surgery (1995); Wrist fracture surgery (1985); Decompressive lumbar laminectomy level 1 (11/10/2012); Lumbar laminectomy/decompression microdiscectomy (11/10/2012); Lumbar laminectomy/decompression microdiscectomy (N/A, 04/29/2017); ABDOMINAL AORTOGRAM W/LOWER EXTREMITY (Bilateral, 05/28/2019); PERIPHERAL VASCULAR INTERVENTION (05/28/2019); Amputation (Left, 05/29/2019); Incision and drainage of wound (Right, 01/19/2020); Skin split graft (Right, 01/19/2020); and  Application if wound vac (Right, 01/19/2020).   His family history includes Arthritis (age of onset: 66) in his son; CAD in his maternal grandfather; Cancer (age of onset: 42) in his daughter; Cancer (age of onset: 72) in his father; Diabetes in his mother; Hypertension in his father; Lupus in his daughter; Stroke in his father.He reports that he quit smoking about 15 years ago. His smoking use included cigarettes and cigars. He  has a 80.00 pack-year smoking history. He has never used smokeless tobacco. He reports previous alcohol use. He reports that he does not use drugs.  Current Outpatient Medications on File Prior to Visit  Medication Sig Dispense Refill   ALPRAZolam (XANAX) 0.25 MG tablet Take 1 tablet (0.25 mg total) by mouth 2 (two) times daily as needed for anxiety. 20 tablet 1   amLODipine (NORVASC) 2.5 MG tablet Take 1 tablet (2.5 mg total) by mouth daily. Take with 5mg  tablet to equal 7.5mg  Amlodipine daily 90 tablet 0   amLODipine (NORVASC) 5 MG tablet Take 1 tablet (5 mg total) by mouth daily. Pt to take 2.5mg  and 5 mg daily to equal 7.5mg  QD 90 tablet 0   Ascorbic Acid (VITAMIN C) 1000 MG tablet Take 1,000 mg by mouth daily.     aspirin EC 81 MG tablet Take 81 mg by mouth at bedtime.     atorvastatin (LIPITOR) 80 MG tablet TAKE 1 TABLET BY MOUTH  EVERY DAY AT 6PM 90 tablet 3   carvedilol (COREG) 25 MG tablet Take 0.5 tablets (12.5 mg total) by mouth 2 (two) times daily with a meal. 90 tablet 1   cephALEXin (KEFLEX) 500 MG capsule Take 1 capsule (500 mg total) by mouth 3 (three) times daily. 40 capsule 0   cholecalciferol (VITAMIN D) 1000 UNITS tablet Take 1,000 Units by mouth daily.     clopidogrel (PLAVIX) 75 MG tablet Take 1 tablet (75 mg total) by mouth daily with breakfast. 90 tablet 3   glimepiride (AMARYL) 2 MG tablet Take 1 tablet (2 mg total) by mouth 2 (two) times daily. 90 tablet 1   HYDROcodone-acetaminophen (NORCO) 10-325 MG tablet SMARTSIG:0.5-1 Tablet(s) By Mouth Every 12 Hours PRN     Latanoprost 0.005 % EMUL Place 1 drop into both eyes in the morning and at bedtime.     losartan (COZAAR) 50 MG tablet Take 1 tablet (50 mg total) by mouth daily. 90 tablet 1   Magnesium 500 MG TABS Take 500 mg by mouth daily.     Multiple Vitamin (MULTIVITAMIN) tablet Take 1 tablet by mouth daily.     Omega-3 Fatty Acids (FISH OIL) 1000 MG CAPS Take 1,000 mg by mouth 2 (two) times daily.     pregabalin  (LYRICA) 150 MG capsule Take 1 capsule (150 mg total) by mouth 2 (two) times daily. 180 capsule 1   PROAIR HFA 108 (90 Base) MCG/ACT inhaler USE 2 PUFFS EVERY 6 HOURS  AS NEEDED FOR WHEEZING OR  SHORTNESS OF BREATH 25.5 g 2   sertraline (ZOLOFT) 50 MG tablet Take 1 tablet (50 mg total) by mouth daily. 90 tablet 1   vitamin B-12 (CYANOCOBALAMIN) 1000 MCG tablet Take 1,000 mcg by mouth 2 (two) times a week. Monday and Thursday     zinc gluconate 50 MG tablet Take 50 mg by mouth daily.     No current facility-administered medications on file prior to visit.     Objective:  Objective  Physical Exam Constitutional:      General: He  is not in acute distress.    Appearance: Normal appearance. He is not ill-appearing or toxic-appearing.  HENT:     Head: Normocephalic and atraumatic.     Right Ear: Tympanic membrane, ear canal and external ear normal.     Left Ear: Tympanic membrane, ear canal and external ear normal.     Nose: No congestion or rhinorrhea.  Eyes:     Extraocular Movements: Extraocular movements intact.     Right eye: No nystagmus.     Left eye: No nystagmus.     Pupils: Pupils are equal, round, and reactive to light.  Cardiovascular:     Rate and Rhythm: Normal rate and regular rhythm.     Pulses: Normal pulses.     Heart sounds: Normal heart sounds. No murmur heard. Pulmonary:     Effort: Pulmonary effort is normal. No respiratory distress.     Breath sounds: Normal breath sounds. No wheezing, rhonchi or rales.  Abdominal:     General: Bowel sounds are normal.     Palpations: Abdomen is soft. There is no mass.     Tenderness: no abdominal tenderness There is no guarding.     Hernia: No hernia is present.  Musculoskeletal:        General: Normal range of motion.     Cervical back: Normal range of motion and neck supple.  Skin:    General: Skin is warm and dry.  Neurological:     Mental Status: He is alert and oriented to person, place, and time.     Cranial Nerves:  No facial asymmetry.     Motor: Motor function is intact. No weakness.  Psychiatric:        Behavior: Behavior normal.   BP 110/72   Pulse 99   Temp 97.9 F (36.6 C)   Resp 16   Ht 5\' 9"  (1.753 m)   Wt 233 lb (105.7 kg)   SpO2 (!) 89%   BMI 34.41 kg/m  Wt Readings from Last 3 Encounters:  05/18/21 233 lb (105.7 kg)  03/22/21 223 lb 12.8 oz (101.5 kg)  03/07/21 216 lb (98 kg)     Lab Results  Component Value Date   WBC 6.7 05/18/2021   HGB 13.9 05/18/2021   HCT 41.8 05/18/2021   PLT 205.0 05/18/2021   GLUCOSE 116 (H) 05/18/2021   CHOL 126 05/18/2021   TRIG 173.0 (H) 05/18/2021   HDL 31.00 (L) 05/18/2021   LDLDIRECT 104.9 12/12/2012   LDLCALC 61 05/18/2021   ALT 21 05/18/2021   AST 18 05/18/2021   NA 140 05/18/2021   K 5.0 05/18/2021   CL 103 05/18/2021   CREATININE 1.09 05/18/2021   BUN 23 05/18/2021   CO2 31 05/18/2021   TSH 6.84 (H) 05/18/2021   PSA 5.56 (H) 05/18/2021   INR 1.1 05/26/2019   HGBA1C 8.3 (H) 03/10/2021   MICROALBUR 0.6 12/12/2012    VAS US RENAL ARTERY DUPLEX  Result Date: 04/18/2021 ABDOMINAL VISCERAL Patient Name:  KALEO CONDREY Castillo  Date of Exam:   04/18/2021 Medical Rec #: [id number]      Accession #:    [id number] Date of Birth: [birth year]     Patient Gender: M Patient Age:   30Y Exam Location:  [facility] Procedure:      VAS US RENAL ARTERY DUPLEX Referring Phys: [id number] JONATHAN J BERRY -------------------------------------------------------------------------------- Indications: Renal artery stenosis and infrarenal distal abdominal aortic  aneurysm. Patient denies any abdominal or back pain. High Risk Factors: Hypertension, hyperlipidemia, Diabetes, past history of                    smoking, coronary artery disease, prior CVA. Other Factors: Covid-19, PAD, COPD. Limitations: Air/bowel gas and obesity. Comparison Study: At the time of angiography performed by Dr. Carlis Abbott in 2020 it                   was found that patient has a proximal 80%  left renal artery                   stenosis. Recent CTA performed on 09/18/2018 revealed an                   aortic dimension of 3.7 x 4.0 cm. Performing Technologist: Sharlett Iles RVT  Examination Guidelines: A complete evaluation includes B-mode imaging, spectral Doppler, color Doppler, and power Doppler as needed of all accessible portions of each vessel. Bilateral testing is considered an integral part of a complete examination. Limited examinations for reoccurring indications may be performed as noted.  Duplex Findings: +--------------------+--------+--------+------+----------------------+ Mesenteric          PSV cm/sEDV cm/sPlaque       Comments        +--------------------+--------+--------+------+----------------------+ Aorta Prox             79                 2.4 cm AP x 2.4 cm TRV +--------------------+--------+--------+------+----------------------+ Aorta Mid             114                 2.2 cm AP x 2.2 cm TRV +--------------------+--------+--------+------+----------------------+ Aorta Distal           64                 4.2 cm AP x 4.5 cm TRV +--------------------+--------+--------+------+----------------------+ Celiac Artery Origin  123      26                                +--------------------+--------+--------+------+----------------------+ SMA Origin            103      10                                +--------------------+--------+--------+------+----------------------+  +------------------+--------+--------+-------+ Right Renal ArteryPSV cm/sEDV cm/sComment +------------------+--------+--------+-------+ Origin              235      43           +------------------+--------+--------+-------+ Proximal            367      77           +------------------+--------+--------+-------+ Mid                 168      31           +------------------+--------+--------+-------+ Distal              110      21            +------------------+--------+--------+-------+ +-----------------+--------+--------+-------+ Left Renal ArteryPSV cm/sEDV cm/sComment +-----------------+--------+--------+-------+ Origin             297  35           +-----------------+--------+--------+-------+ Proximal           347      36           +-----------------+--------+--------+-------+ Mid                278      29           +-----------------+--------+--------+-------+ Distal             148      24           +-----------------+--------+--------+-------+ +------------+--------+--------+----+-----------+--------+--------+----+ Right KidneyPSV cm/sEDV cm/sRI  Left KidneyPSV cm/sEDV cm/sRI   +------------+--------+--------+----+-----------+--------+--------+----+ Upper Pole  19      7       0.65Upper Pole 19      5       0.73 +------------+--------+--------+----+-----------+--------+--------+----+ Mid         32      7       0.80Mid        43      8       0.81 +------------+--------+--------+----+-----------+--------+--------+----+ Lower Pole  42      11      0.74Lower Pole 27      5       0.79 +------------+--------+--------+----+-----------+--------+--------+----+ Hilar       59      14      0.76Hilar      75      11      0.85 +------------+--------+--------+----+-----------+--------+--------+----+ +------------------+------+------------------+------+ Right Kidney            Left Kidney              +------------------+------+------------------+------+ RAR                     RAR                      +------------------+------+------------------+------+ RAR (manual)      4.65  RAR (manual)      4.39   +------------------+------+------------------+------+ Cortex            .80 cmCortex            .89 cm +------------------+------+------------------+------+ Cortex thickness        Corex thickness          +------------------+------+------------------+------+  Kidney length (cm)11.20 Kidney length (cm)10.50  +------------------+------+------------------+------+  Summary: Largest Aortic Diameter: Aortoiliac atherosclerosis with abnormal dilatation of the infrarenal distal abdominal aorta. Fusiform distal abdominal aorta, measuring 4.5 cm. These dimensions have increased by .5 compared to prior CT on 09/18/2018.  Renal:  Right: Normal size right kidney. Abnormal right Resistive Index.        Normal cortical thickness of right kidney. Evidence of a        greater than 60% stenosis of the right renal artery. RRV flow        present. Left:  Normal size of left kidney. Abnormal left Resistive Index.        Normal cortical thickness of the left kidney. Evidence of a >        60% stenosis in the left renal artery. LRV flow present. Mesenteric: Normal Celiac artery and Superior Mesenteric artery findings.  Patent IVC.  *See table(s) above for measurements and observations.  Diagnosing physician: Quay Burow MD  Electronically signed by Quay Burow MD on 04/18/2021 at 2:15:58 PM.    Final  Assessment & Plan:  Plan    Meds ordered this encounter  Medications   tiZANidine (ZANAFLEX) 2 MG tablet    Sig: Take 0.5-2 tablets (1-4 mg total) by mouth 2 (two) times daily as needed for muscle spasms.    Dispense:  40 tablet    Refill:  1     Problem List Items Addressed This Visit     Gout - Primary   Relevant Orders   Uric acid (Completed)   Diabetes mellitus type 2 with neurological manifestations ([place])   Relevant Orders   CBC with Differential/Platelet (Completed)   Comprehensive metabolic panel (Completed)   Lipid panel (Completed)   TSH (Completed)   Hemoglobin A1c   Hyperlipidemia   Relevant Orders   CBC with Differential/Platelet (Completed)   Comprehensive metabolic panel (Completed)   Lipid panel (Completed)   TSH (Completed)   Renal insufficiency    Supplement and monitor       Preventative health care    Patient encouraged to  maintain heart healthy diet, regular exercise, adequate sleep. Consider daily probiotics. Take medications as prescribed. Labs ordered and reviewed. Colonoscopy last in 2007 encouraged to proceed with repeat colonoscopy.       Relevant Orders   CBC with Differential/Platelet (Completed)   Comprehensive metabolic panel (Completed)   Lipid panel (Completed)   TSH (Completed)   Wound of left foot submetatarsal 1    He has had to have another surgery with Dr Jacqualyn Posey his podiatrist. He is healing well, he is still wearing an immobility boot.        Neck pain    Encouraged moist heat and gentle stretching as tolerated. May try NSAIDs and prescription meds as directed and report if symptoms worsen or seek immediate care. Xray ordered today. Muscle relaxers prn. Consider MRI and referral if persists       Relevant Orders   DG Cervical Spine Complete   Other Visit Diagnoses     Nocturia       Relevant Orders   PSA (Completed)   Essential hypertension       Relevant Orders   CBC with Differential/Platelet (Completed)   Comprehensive metabolic panel (Completed)   Lipid panel (Completed)   TSH (Completed)   High risk medication use       Relevant Orders   DRUG MONITORING, PANEL 8 WITH CONFIRMATION, URINE       Follow-up: Return in about 4 months (around 09/18/2021), or f/u.  I, Suezanne Jacquet, acting as a scribe for Penni Homans, MD, have documented all relevent documentation on behalf of Penni Homans, MD, as directed by Penni Homans, MD while in the presence of Penni Homans, MD.  I, [name], MD personally performed the services described in this documentation. All medical record entries made by the scribe were at my direction and in my presence. I have reviewed the chart and agree that the record reflects my personal performance and is accurate and complete

## 2021-05-18 NOTE — Patient Instructions (Addendum)
Do a hgba1c on or after 06/12/2021  Paxlovid is the new COVID medication we can give you if you get COVID so make sure you test if you have symptoms because we have to treat by day 5 of symptoms for it to be effective. If you are positive let us know so we can treat. If a home test is negative and your symptoms are persistent get a PCR test. Can check testing locations at [email or web address] If you are positive we will make an appointment with [name] and we will send in Paxlovid if you would like it. Check with your pharmacy before we meet to confirm they have it in stock, if they do not then we can get the prescription at the [place] 65 Years and Older, Male Preventive care refers to lifestyle choices and visits with your health care provider that can promote health and wellness. This includes: A yearly physical exam. This is also called an annual wellness visit. Regular dental and eye exams. Immunizations. Screening for certain conditions. Healthy lifestyle choices, such as: Eating a healthy diet. Getting regular exercise. Not using drugs or products that contain nicotine and tobacco. Limiting alcohol use. What can I expect for my preventive care visit? Physical exam Your health care provider will check your: Height and weight. These may be used to calculate your BMI (body mass index). BMI is a measurement that tells if you are at a healthy weight. Heart rate and blood pressure. Body temperature. Skin for abnormal spots. Counseling Your health care provider may ask you questions about your: Past medical problems. Family's medical history. Alcohol, tobacco, and drug use. Emotional well-being. Home life and relationship well-being. Sexual activity. Diet, exercise, and sleep habits. History of falls. Memory and ability to understand (cognition). Work and work Statistician. Access to firearms. What immunizations do I need?  Vaccines are usually given at  various ages, according to a schedule. Your health care provider will recommend vaccines for you based on your age, medicalhistory, and lifestyle or other factors, such as travel or where you work. What tests do I need? Blood tests Lipid and cholesterol levels. These may be checked every 5 years, or more often depending on your overall health. Hepatitis C test. Hepatitis B test. Screening Lung cancer screening. You may have this screening every year starting at age 77 if you have a 30-pack-year history of smoking and currently smoke or have quit within the past 15 years. Colorectal cancer screening. All adults should have this screening starting at age 11 and continuing until age 65. Your health care provider may recommend screening at age 64 if you are at increased risk. You will have tests every 1-10 years, depending on your results and the type of screening test. Prostate cancer screening. Recommendations will vary depending on your family history and other risks. Genital exam to check for testicular cancer or hernias. Diabetes screening. This is done by checking your blood sugar (glucose) after you have not eaten for a while (fasting). You may have this done every 1-3 years. Abdominal aortic aneurysm (AAA) screening. You may need this if you are a current or former smoker. STD (sexually transmitted disease) testing, if you are at risk. Follow these instructions at home: Eating and drinking  Eat a diet that includes fresh fruits and vegetables, whole grains, lean protein, and low-fat dairy products. Limit your intake of foods with high amounts of sugar, saturated fats, and salt. Take vitamin and mineral supplements as  recommended by your health care provider. Do not drink alcohol if your health care provider tells you not to drink. If you drink alcohol: Limit how much you have to 0-2 drinks a day. Be aware of how much alcohol is in your drink. In the [place] one drink equals one 12 oz  bottle of beer (355 mL), one 5 oz glass of wine (148 mL), or one 1 oz glass of hard liquor (44 mL).  Lifestyle Take daily care of your teeth and gums. Brush your teeth every morning and night with fluoride toothpaste. Floss one time each day. Stay active. Exercise for at least 30 minutes 5 or more days each week. Do not use any products that contain nicotine or tobacco, such as cigarettes, e-cigarettes, and chewing tobacco. If you need help quitting, ask your health care provider. Do not use drugs. If you are sexually active, practice safe sex. Use a condom or other form of protection to prevent STIs (sexually transmitted infections). Talk with your health care provider about taking a low-dose aspirin or statin. Find healthy ways to cope with stress, such as: Meditation, yoga, or listening to music. Journaling. Talking to a trusted person. Spending time with friends and family. Safety Always wear your seat belt while driving or riding in a vehicle. Do not drive: If you have been drinking alcohol. Do not ride with someone who has been drinking. When you are tired or distracted. While texting. Wear a helmet and other protective equipment during sports activities. If you have firearms in your house, make sure you follow all gun safety procedures. What's next? Visit your health care provider once a year for an annual wellness visit. Ask your health care provider how often you should have your eyes and teeth checked. Stay up to date on all vaccines. This information is not intended to replace advice given to you by your health care provider. Make sure you discuss any questions you have with your healthcare provider. Document Revised: 07/21/2019 Document Reviewed: 10/16/2018 Elsevier Patient Education  2022 [employer].

## 2021-05-19 ENCOUNTER — Encounter: Payer: Self-pay | Admitting: Family Medicine

## 2021-05-19 ENCOUNTER — Other Ambulatory Visit (INDEPENDENT_AMBULATORY_CARE_PROVIDER_SITE_OTHER): Payer: Medicare Other

## 2021-05-19 DIAGNOSIS — R7989 Other specified abnormal findings of blood chemistry: Secondary | ICD-10-CM

## 2021-05-19 LAB — T4, FREE: Free T4: 0.71 ng/dL (ref 0.60–1.60)

## 2021-05-20 ENCOUNTER — Other Ambulatory Visit: Payer: Self-pay | Admitting: Family Medicine

## 2021-05-20 DIAGNOSIS — E039 Hypothyroidism, unspecified: Secondary | ICD-10-CM

## 2021-05-20 HISTORY — DX: Hypothyroidism, unspecified: E03.9

## 2021-05-20 MED ORDER — LEVOTHYROXINE SODIUM 25 MCG PO TABS: 25.0000 ug | ORAL_TABLET | Freq: Every day | ORAL | 1 refills | Status: DC

## 2021-05-20 NOTE — Assessment & Plan Note (Signed)
Supplement and monitor 

## 2021-05-20 NOTE — Assessment & Plan Note (Signed)
He has had to have another surgery with Dr Jacqualyn Posey his podiatrist. He is healing well, he is still wearing an immobility boot.

## 2021-05-20 NOTE — Assessment & Plan Note (Signed)
Encouraged moist heat and gentle stretching as tolerated. May try NSAIDs and prescription meds as directed and report if symptoms worsen or seek immediate care. Xray ordered today. Muscle relaxers prn. Consider MRI and referral if persists

## 2021-05-20 NOTE — Assessment & Plan Note (Signed)
Patient encouraged to maintain heart healthy diet, regular exercise, adequate sleep. Consider daily probiotics. Take medications as prescribed. Labs ordered and reviewed. Colonoscopy last in 2007 encouraged to proceed with repeat colonoscopy.

## 2021-05-20 NOTE — Assessment & Plan Note (Signed)
New onset, mild, after discussion he chooses to start treatment started on Levothyroxine 25 mcg daily

## 2021-05-21 LAB — DM TEMPLATE

## 2021-05-21 LAB — DRUG MONITORING, PANEL 8 WITH CONFIRMATION, URINE
6 Acetylmorphine: NEGATIVE ng/mL (ref <10)
Alcohol Metabolites: NEGATIVE ng/mL (ref <500)
Alphahydroxyalprazolam: 299 ng/mL — ABNORMAL HIGH (ref <25)
Alphahydroxymidazolam: NEGATIVE ng/mL (ref <50)
Alphahydroxytriazolam: NEGATIVE ng/mL (ref <50)
Aminoclonazepam: NEGATIVE ng/mL (ref <25)
Amphetamines: NEGATIVE ng/mL (ref <500)
Benzodiazepines: POSITIVE ng/mL — AB (ref <100)
Buprenorphine, Urine: NEGATIVE ng/mL (ref <5)
Cocaine Metabolite: NEGATIVE ng/mL (ref <150)
Codeine: NEGATIVE ng/mL (ref <50)
Creatinine: 248.8 mg/dL (ref >=20.0)
Hydrocodone: 1804 ng/mL — ABNORMAL HIGH (ref <50)
Hydromorphone: 312 ng/mL — ABNORMAL HIGH (ref <50)
Hydroxyethylflurazepam: NEGATIVE ng/mL (ref <50)
Lorazepam: NEGATIVE ng/mL (ref <50)
MDMA: NEGATIVE ng/mL (ref <500)
Marijuana Metabolite: NEGATIVE ng/mL (ref <20)
Morphine: NEGATIVE ng/mL (ref <50)
Nordiazepam: NEGATIVE ng/mL (ref <50)
Norhydrocodone: 1893 ng/mL — ABNORMAL HIGH (ref <50)
Opiates: POSITIVE ng/mL — AB (ref <100)
Oxazepam: NEGATIVE ng/mL (ref <50)
Oxidant: NEGATIVE ug/mL (ref <200)
Oxycodone: NEGATIVE ng/mL (ref <100)
Temazepam: NEGATIVE ng/mL (ref <50)
pH: 5.6 (ref 4.5–9.0)

## 2021-05-22 ENCOUNTER — Other Ambulatory Visit: Payer: Self-pay

## 2021-05-22 ENCOUNTER — Ambulatory Visit (HOSPITAL_BASED_OUTPATIENT_CLINIC_OR_DEPARTMENT_OTHER)
Admission: RE | Admit: 2021-05-22 | Discharge: 2021-05-22 | Disposition: A | Discharge status: Home / Self Care | Payer: Medicare Other | Source: Ambulatory Visit | Attending: Family Medicine | Admitting: Family Medicine

## 2021-05-22 DIAGNOSIS — M542 Cervicalgia: Secondary | ICD-10-CM | POA: Diagnosis not present

## 2021-05-25 ENCOUNTER — Other Ambulatory Visit: Payer: Self-pay | Admitting: Family Medicine

## 2021-05-25 DIAGNOSIS — I779 Disorder of arteries and arterioles, unspecified: Secondary | ICD-10-CM

## 2021-05-29 ENCOUNTER — Ambulatory Visit (INDEPENDENT_AMBULATORY_CARE_PROVIDER_SITE_OTHER): Payer: Medicare Other | Admitting: Podiatry

## 2021-05-29 ENCOUNTER — Encounter: Payer: Self-pay | Admitting: Podiatry

## 2021-05-29 ENCOUNTER — Other Ambulatory Visit: Payer: Self-pay

## 2021-05-29 DIAGNOSIS — L97522 Non-pressure chronic ulcer of other part of left foot with fat layer exposed: Secondary | ICD-10-CM

## 2021-05-29 DIAGNOSIS — E11621 Type 2 diabetes mellitus with foot ulcer: Secondary | ICD-10-CM

## 2021-05-29 DIAGNOSIS — L97529 Non-pressure chronic ulcer of other part of left foot with unspecified severity: Secondary | ICD-10-CM

## 2021-06-01 NOTE — Progress Notes (Signed)
Subjective: [name] is a 75 y.o. is seen today in office s/p partial resection of left first metatarsal with excision of his wound preformed on 03/29/2021.  He states the area is healed measuring about to work.  Denies any swelling or redness or any drainage.  No fevers or chills.  No other concerns today.   Objective: General: No acute distress, AAOx3 -wife present Neurovascular status unchanged Left foot: Incision is well coapted.  On the plantar aspect of the foot along the previous wound this appears to be healed.  There is no probing, undermining or tunneling.  There is minimal hyperkeratotic tissue with a debrided today with any complications or bleeding.  Underlying wound appears to be healed.  There is no fluctuation or crepitation.  There is no malodor. No other open lesions or pre-ulcerative lesions.  No pain with calf compression, swelling, warmth, erythema.   Assessment and Plan:  Status post first metatarsal partial excision, wound excision  -Treatment options discussed including all alternatives, risks, and complications -Wound appears to be healed.  I discussed with him that still keep the bandage on the area and discussed gradual transition back into regular shoe with the importance of daily foot inspection to monitor for any recurrence or any new issues.  If remains healed this week he can start to return next week to work although with limited activity and sitting. -Monitor for any clinical signs or symptoms of infection and directed to call the office immediately should any occur or go to the ER.  Return in about 2 weeks (around 06/12/2021).  [name]

## 2021-06-09 DIAGNOSIS — G8929 Other chronic pain: Secondary | ICD-10-CM | POA: Insufficient documentation

## 2021-06-09 DIAGNOSIS — M545 Low back pain, unspecified: Secondary | ICD-10-CM | POA: Diagnosis not present

## 2021-06-12 ENCOUNTER — Encounter: Payer: Self-pay | Admitting: Family Medicine

## 2021-06-12 ENCOUNTER — Other Ambulatory Visit: Payer: Self-pay | Admitting: Family Medicine

## 2021-06-12 DIAGNOSIS — E1159 Type 2 diabetes mellitus with other circulatory complications: Secondary | ICD-10-CM

## 2021-06-12 MED ORDER — ALPRAZOLAM 0.25 MG PO TABS: 0.2500 mg | ORAL_TABLET | Freq: Two times a day (BID) | ORAL | 1 refills | Status: DC | PRN

## 2021-06-12 MED ORDER — SERTRALINE HCL 50 MG PO TABS: 50.0000 mg | ORAL_TABLET | Freq: Every day | ORAL | 1 refills | Status: DC

## 2021-06-12 MED ORDER — GLIMEPIRIDE 2 MG PO TABS
2.0000 mg | ORAL_TABLET | Freq: Two times a day (BID) | ORAL | 1 refills | Status: DC
Start: 2021-06-12 — End: 2021-07-18

## 2021-06-12 NOTE — Telephone Encounter (Signed)
Requesting: alprazolam 0.'25mg'$  Contract: 12/06/2016 UDS: 05/18/2021 Last Visit: 05/18/2021 Next Visit: 09/19/2021 Last Refill: 03/06/2021 #20 and 1RF  Please Advise

## 2021-06-13 ENCOUNTER — Ambulatory Visit (INDEPENDENT_AMBULATORY_CARE_PROVIDER_SITE_OTHER): Payer: Medicare Other | Admitting: Podiatry

## 2021-06-13 ENCOUNTER — Encounter: Payer: Self-pay | Admitting: Podiatry

## 2021-06-13 ENCOUNTER — Other Ambulatory Visit: Payer: Self-pay

## 2021-06-13 DIAGNOSIS — L97529 Non-pressure chronic ulcer of other part of left foot with unspecified severity: Secondary | ICD-10-CM

## 2021-06-13 DIAGNOSIS — B351 Tinea unguium: Secondary | ICD-10-CM

## 2021-06-13 DIAGNOSIS — M79675 Pain in left toe(s): Secondary | ICD-10-CM | POA: Diagnosis not present

## 2021-06-13 DIAGNOSIS — E11621 Type 2 diabetes mellitus with foot ulcer: Secondary | ICD-10-CM | POA: Diagnosis not present

## 2021-06-13 DIAGNOSIS — M79674 Pain in right toe(s): Secondary | ICD-10-CM | POA: Diagnosis not present

## 2021-06-13 DIAGNOSIS — L97522 Non-pressure chronic ulcer of other part of left foot with fat layer exposed: Secondary | ICD-10-CM

## 2021-06-16 NOTE — Progress Notes (Signed)
Subjective: [name] is a 75 y.o. is seen today in office s/p partial resection of left first metatarsal with excision of his wound preformed on 03/29/2021.  He states that the wound is doing well and is healed.  He worked the [employer] this past weekend has been on his feet wearing regular shoes without any opening or any increase in swelling or redness.  His nails are thickened and elongated which do cause occasional discomfort.  No swelling or redness of the toenail sites.  No other concerns.  Objective: General: No acute distress, AAOx3 -wife present Neurovascular status unchanged Left foot: Incision is well coapted.  On the plantar aspect of the foot on the area the previous wound appears to be healed.  No skin breakdown identified bilaterally.  There is no sign of edema, erythema or any signs of infection. Nails are hypertrophic, dystrophic, brittle, discolored, elongated 9. No surrounding redness or drainage. Tenderness nails 1-5 on the right and 2 through 5 on the left. No pain with calf compression, swelling, warmth, erythema.   Assessment and Plan:  Status post first metatarsal partial excision, wound excision- healed; symptomatic onychomycosis  -Treatment options discussed including all alternatives, risks, and complications -Wound appears to be healed.  Continue offloading and daily foot inspection.  Encourage glucose control. -Sharply debrided the nails x9 without any complications or bleeding. -Monitor for any clinical signs or symptoms of infection and directed to call the office immediately should any occur or go to the ER.  Return in about 4 weeks (around 07/11/2021).  [name]

## 2021-06-24 ENCOUNTER — Other Ambulatory Visit: Payer: Self-pay | Admitting: Family Medicine

## 2021-07-03 ENCOUNTER — Encounter: Payer: Self-pay | Admitting: Family Medicine

## 2021-07-03 MED ORDER — ALBUTEROL SULFATE HFA 108 (90 BASE) MCG/ACT IN AERS: INHALATION_SPRAY | RESPIRATORY_TRACT | 1 refills | Status: DC

## 2021-07-17 ENCOUNTER — Telehealth: Payer: Self-pay | Admitting: *Deleted

## 2021-07-17 ENCOUNTER — Encounter: Payer: Self-pay | Admitting: Family Medicine

## 2021-07-17 NOTE — Telephone Encounter (Signed)
Patient does not want to use this pharmacy.    He wanted to get just a machine to check sugar.  Advised him to call his insurance to see what machine and test strips they pay for.

## 2021-07-18 ENCOUNTER — Other Ambulatory Visit: Payer: Self-pay

## 2021-07-18 ENCOUNTER — Other Ambulatory Visit: Payer: Self-pay | Admitting: Family Medicine

## 2021-07-18 ENCOUNTER — Ambulatory Visit (INDEPENDENT_AMBULATORY_CARE_PROVIDER_SITE_OTHER): Payer: Medicare Other | Admitting: Podiatry

## 2021-07-18 ENCOUNTER — Encounter: Payer: Self-pay | Admitting: Podiatry

## 2021-07-18 ENCOUNTER — Ambulatory Visit (INDEPENDENT_AMBULATORY_CARE_PROVIDER_SITE_OTHER): Payer: Medicare Other

## 2021-07-18 DIAGNOSIS — L97522 Non-pressure chronic ulcer of other part of left foot with fat layer exposed: Secondary | ICD-10-CM | POA: Diagnosis not present

## 2021-07-18 DIAGNOSIS — M2042 Other hammer toe(s) (acquired), left foot: Secondary | ICD-10-CM

## 2021-07-18 DIAGNOSIS — S99922A Unspecified injury of left foot, initial encounter: Secondary | ICD-10-CM

## 2021-07-18 DIAGNOSIS — M2041 Other hammer toe(s) (acquired), right foot: Secondary | ICD-10-CM

## 2021-07-18 MED ORDER — CEPHALEXIN 500 MG PO CAPS: 500.0000 mg | ORAL_CAPSULE | Freq: Three times a day (TID) | ORAL | 0 refills | Status: DC

## 2021-07-19 ENCOUNTER — Other Ambulatory Visit: Payer: Self-pay | Admitting: *Deleted

## 2021-07-19 ENCOUNTER — Other Ambulatory Visit: Payer: Self-pay

## 2021-07-19 MED ORDER — ACCU-CHEK GUIDE VI STRP: ORAL_STRIP | 1 refills | Status: DC

## 2021-07-19 MED ORDER — ACCU-CHEK GUIDE ME W/DEVICE KIT: PACK | 0 refills | Status: DC

## 2021-07-19 MED ORDER — ACCU-CHEK FASTCLIX LANCETS MISC: 1 refills | Status: DC

## 2021-07-23 NOTE — Progress Notes (Signed)
Subjective: [name] is a 75 y.o. is seen today in office s/p partial resection of left first metatarsal with excision of his wound preformed on 03/29/2021.  He states that the wound is healed and done well however he did stub his toes a couple weeks ago.  He gets a callus at the tip of the second toe.  No open sores that he recalls.  No fevers or chills.  He has no other concerns.   Objective: General: No acute distress, AAOx3 -wife present Neurovascular status unchanged Left foot: Incision is well coapted.  On plantar aspect on the area of previous wound this is healed.  There is no skin breakdown to this area.  However at the distal aspect the left second toe is a hyperkeratotic lesion upon debridement superficial granular wound present without any probing to bone, undermining or tunneling.  Localized edema and erythema.  There is no probing to bone.  There is localized edema and erythema there is no fluctuation crepitation.  There is no malodor. Hammertoe present No pain with calf compression, swelling, warmth, erythema.   Assessment and Plan:  Status post first metatarsal partial excision, wound excision- healed; new wounds second toe  -Treatment options discussed including all alternatives, risks, and complications -X-rays obtained reviewed.  No evidence of acute fracture.  Hammertoes present.  No definitive evidence of osteomyelitis. -The wound status post wound is healed.  However to the second toe there is a hyperkeratotic lesion with central granular wound which I sharply debrided today utilizing a #312 with scalpel down to healthy, granular tissue without any blood loss.  Given localized edema and erythema will start cephalexin which was ordered.  Offloading at all times and offloading pad was dispensed. -Monitor for any clinical signs or symptoms of infection and directed to call the office immediately should any occur or go to the ER.  [name]

## 2021-07-24 ENCOUNTER — Encounter: Payer: Self-pay | Admitting: Family Medicine

## 2021-07-24 ENCOUNTER — Other Ambulatory Visit: Payer: Self-pay | Admitting: Family Medicine

## 2021-07-24 MED ORDER — ALPRAZOLAM 0.25 MG PO TABS: 0.2500 mg | ORAL_TABLET | Freq: Two times a day (BID) | ORAL | 1 refills | Status: DC | PRN

## 2021-07-25 DIAGNOSIS — H401131 Primary open-angle glaucoma, bilateral, mild stage: Secondary | ICD-10-CM | POA: Diagnosis not present

## 2021-08-01 ENCOUNTER — Ambulatory Visit (INDEPENDENT_AMBULATORY_CARE_PROVIDER_SITE_OTHER): Payer: Medicare Other | Admitting: Podiatry

## 2021-08-01 ENCOUNTER — Other Ambulatory Visit: Payer: Self-pay

## 2021-08-01 VITALS — Temp 97.2°F

## 2021-08-01 DIAGNOSIS — M2042 Other hammer toe(s) (acquired), left foot: Secondary | ICD-10-CM | POA: Diagnosis not present

## 2021-08-01 DIAGNOSIS — L97522 Non-pressure chronic ulcer of other part of left foot with fat layer exposed: Secondary | ICD-10-CM

## 2021-08-01 DIAGNOSIS — M2041 Other hammer toe(s) (acquired), right foot: Secondary | ICD-10-CM

## 2021-08-04 NOTE — Progress Notes (Signed)
Subjective: [name] is a 75 y.o. is seen today for follow-up evaluation of a wound on the second toe.  He states he is seeing slight drainage.  He still on cephalexin.  He has no fevers or chills.  The wound that he had submetatarsal 1 is doing well and not see any reoccurrence.  No new concerns.    Objective: General: No acute distress, AAOx3 -wife present Neurovascular status unchanged Left foot: Incision is well coapted.  On plantar aspect on the area of previous wound this is healed.  Hammertoe deformities present in the second toe resulted in hyperkeratotic lesion with central granular wound.  Upon debridement there is still superficial wound present without any probing to bone, undermining or tunneling.  There is localized edema and erythema still to the distal portion of the toe.  There is no fluctuation crepitation.  There is no malodor. No pain with calf compression, swelling, warmth, erythema.   Assessment and Plan:  Ulceration left second toe  -Treatment options discussed including all alternatives, risks, and complications -Sharply debrided the hyperkeratotic tissue, the wound down to healthy, granular tissue without any complications.  No significant blood loss.  Tolerated well.  Recommend antibiotic ointment dressing changes for now.  Finish course of cephalexin, offloading.  Discussion with the office was much as possible but has been on his feet more recently as his wife has Slovan.  He is previously had a flexor steroid but discussed trying repeat this to see if will be beneficial.  Consider this next appointment if needed. -Monitor for any clinical signs or symptoms of infection and directed to call the office immediately should any occur or go to the ER.  Return in about 10 days (around 08/11/2021).  [name]

## 2021-08-18 ENCOUNTER — Other Ambulatory Visit: Payer: Self-pay

## 2021-08-18 ENCOUNTER — Ambulatory Visit (INDEPENDENT_AMBULATORY_CARE_PROVIDER_SITE_OTHER): Payer: Medicare Other | Admitting: Podiatry

## 2021-08-18 DIAGNOSIS — L97522 Non-pressure chronic ulcer of other part of left foot with fat layer exposed: Secondary | ICD-10-CM | POA: Diagnosis not present

## 2021-08-18 DIAGNOSIS — M2042 Other hammer toe(s) (acquired), left foot: Secondary | ICD-10-CM

## 2021-08-18 DIAGNOSIS — M2041 Other hammer toe(s) (acquired), right foot: Secondary | ICD-10-CM

## 2021-08-22 NOTE — Progress Notes (Signed)
Subjective: [name] is a 75 y.o. is seen today for follow-up evaluation of a wound on the second toe.  There is concern improvement in the second toe.  His wife did notice that the wound on that was present on the more medial submetatarsal 1 did reopen but she has been changing the bandage.  No drainage or pus.  No increase in swelling or redness.  He is back on his feet at work.  Objective: General: No acute distress, AAOx3 -wife present Neurovascular status unchanged Left foot: Incision is well coapted.  On plantar aspect on the area of previous wound 1 small black spot with a scab present.  No open sore identified today and there is no edema, erythema or signs of infection.  Hammertoe deformities present in the second toe resulted in hyperkeratotic lesion with central granular wound.  Upon debridement there is still superficial wound present without any probing to bone, undermining or tunneling.  It does appear smaller today and improved.  There is improved edema and erythema but still present to the distal portion of the toe.  There is no fluctuation crepitation.  There is no malodor. No pain with calf compression, swelling, warmth, erythema.   Assessment and Plan:  Ulceration left second toe; preulcerative lesion submetatarsal 1  -Treatment options discussed including all alternatives, risks, and complications -Sharply debrided the hyperkeratotic tissue, the wound down to healthy, granular tissue without any complications on the second digit.  No significant blood loss.  Tolerated well.  Recommend antibiotic ointment dressing changes for now.  Finish course of antibiotics.  Continue offloading.  Discussed when has not worked with a surgical shoe for offloading as well.  He is to monitor very closely for signs or symptoms of worsening infection or worsening of the wound.  If any changes with her creatinine is no immediately. -I discussed repeat ultrasound of the second digit.  I will  anticipate helpful as the toe is rigid.  Discussed with him arthroplasty of the digit if symptoms continue. -Monitor for any clinical signs or symptoms of infection and directed to call the office immediately should any occur or go to the ER.  Return in about 2 weeks (around 09/01/2021) for toe ulcer left.  [name]

## 2021-08-23 ENCOUNTER — Ambulatory Visit (INDEPENDENT_AMBULATORY_CARE_PROVIDER_SITE_OTHER): Payer: Medicare Other

## 2021-08-23 VITALS — Ht 69.0 in | Wt 230.0 lb

## 2021-08-23 DIAGNOSIS — Z1211 Encounter for screening for malignant neoplasm of colon: Secondary | ICD-10-CM

## 2021-08-23 DIAGNOSIS — Z0000 Encounter for general adult medical examination without abnormal findings: Secondary | ICD-10-CM | POA: Diagnosis not present
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
## 2021-08-23 NOTE — Patient Instructions (Addendum)
[name] , Thank you for taking time to complete your Medicare Wellness Visit. I appreciate your ongoing commitment to your health goals. Please review the following plan we discussed and let me know if I can assist you in the future.   Screening recommendations/referrals: Colonoscopy: Due, will order cologuard today. Recommended yearly ophthalmology/optometry visit for glaucoma screening and checkup Recommended yearly dental visit for hygiene and checkup  Vaccinations: Influenza vaccine: Due, discuss with PCP or local pharmacy  Pneumococcal vaccine: Up to date Tdap vaccine: Up to date Shingles vaccine: Discuss with you local pharmacy   Covid-19: Discuss with local pharmacy  Advanced directives: copy on file in chart  Conditions/risks identified: See problem list  Next appointment: Follow up in one year for your annual wellness visit. 08/28/22 @9 :40am  Preventive Care 65 Years and Older, Male Preventive care refers to lifestyle choices and visits with your health care provider that can promote health and wellness. What does preventive care include? A yearly physical exam. This is also called an annual well check. Dental exams once or twice a year. Routine eye exams. Ask your health care provider how often you should have your eyes checked. Personal lifestyle choices, including: Daily care of your teeth and gums. Regular physical activity. Eating a healthy diet. Avoiding tobacco and drug use. Limiting alcohol use. Practicing safe sex. Taking low doses of aspirin every day. Taking vitamin and mineral supplements as recommended by your health care provider. What happens during an annual well check? The services and screenings done by your health care provider during your annual well check will depend on your age, overall health, lifestyle risk factors, and family history of disease. Counseling  Your health care provider may ask you questions about your: Alcohol use. Tobacco  use. Drug use. Emotional well-being. Home and relationship well-being. Sexual activity. Eating habits. History of falls. Memory and ability to understand (cognition). Work and work Statistician. Screening  You may have the following tests or measurements: Height, weight, and BMI. Blood pressure. Lipid and cholesterol levels. These may be checked every 5 years, or more frequently if you are over 66 years old. Skin check. Lung cancer screening. You may have this screening every year starting at age 74 if you have a 30-pack-year history of smoking and currently smoke or have quit within the past 15 years. Fecal occult blood test (FOBT) of the stool. You may have this test every year starting at age 74. Flexible sigmoidoscopy or colonoscopy. You may have a sigmoidoscopy every 5 years or a colonoscopy every 10 years starting at age 74. Prostate cancer screening. Recommendations will vary depending on your family history and other risks. Hepatitis C blood test. Hepatitis B blood test. Sexually transmitted disease (STD) testing. Diabetes screening. This is done by checking your blood sugar (glucose) after you have not eaten for a while (fasting). You may have this done every 1-3 years. Abdominal aortic aneurysm (AAA) screening. You may need this if you are a current or former smoker. Osteoporosis. You may be screened starting at age 74 if you are at high risk. Talk with your health care provider about your test results, treatment options, and if necessary, the need for more tests. Vaccines  Your health care provider may recommend certain vaccines, such as: Influenza vaccine. This is recommended every year. Tetanus, diphtheria, and acellular pertussis (Tdap, Td) vaccine. You may need a Td booster every 10 years. Zoster vaccine. You may need this after age 13. Pneumococcal 13-valent conjugate (PCV13) vaccine. One dose  is recommended after age 74. Pneumococcal polysaccharide (PPSV23) vaccine.  One dose is recommended after age 60. Talk to your health care provider about which screenings and vaccines you need and how often you need them. This information is not intended to replace advice given to you by your health care provider. Make sure you discuss any questions you have with your health care provider. Document Released: 11/18/2015 Document Revised: 07/11/2016 Document Reviewed: 08/23/2015 Elsevier Interactive Patient Education  2017 [place] Prevention in the Home Falls can cause injuries. They can happen to people of all ages. There are many things you can do to make your home safe and to help prevent falls. What can I do on the outside of my home? Regularly fix the edges of walkways and driveways and fix any cracks. Remove anything that might make you trip as you walk through a door, such as a raised step or threshold. Trim any bushes or trees on the path to your home. Use bright outdoor lighting. Clear any walking paths of anything that might make someone trip, such as rocks or tools. Regularly check to see if handrails are loose or broken. Make sure that both sides of any steps have handrails. Any raised decks and porches should have guardrails on the edges. Have any leaves, snow, or ice cleared regularly. Use sand or salt on walking paths during winter. Clean up any spills in your garage right away. This includes oil or grease spills. What can I do in the bathroom? Use night lights. Install grab bars by the toilet and in the tub and shower. Do not use towel bars as grab bars. Use non-skid mats or decals in the tub or shower. If you need to sit down in the shower, use a plastic, non-slip stool. Keep the floor dry. Clean up any water that spills on the floor as soon as it happens. Remove soap buildup in the tub or shower regularly. Attach bath mats securely with double-sided non-slip rug tape. Do not have throw rugs and other things on the floor that can make  you trip. What can I do in the bedroom? Use night lights. Make sure that you have a light by your bed that is easy to reach. Do not use any sheets or blankets that are too big for your bed. They should not hang down onto the floor. Have a firm chair that has side arms. You can use this for support while you get dressed. Do not have throw rugs and other things on the floor that can make you trip. What can I do in the kitchen? Clean up any spills right away. Avoid walking on wet floors. Keep items that you use a lot in easy-to-reach places. If you need to reach something above you, use a strong step stool that has a grab bar. Keep electrical cords out of the way. Do not use floor polish or wax that makes floors slippery. If you must use wax, use non-skid floor wax. Do not have throw rugs and other things on the floor that can make you trip. What can I do with my stairs? Do not leave any items on the stairs. Make sure that there are handrails on both sides of the stairs and use them. Fix handrails that are broken or loose. Make sure that handrails are as long as the stairways. Check any carpeting to make sure that it is firmly attached to the stairs. Fix any carpet that is loose or worn. Avoid  having throw rugs at the top or bottom of the stairs. If you do have throw rugs, attach them to the floor with carpet tape. Make sure that you have a light switch at the top of the stairs and the bottom of the stairs. If you do not have them, ask someone to add them for you. What else can I do to help prevent falls? Wear shoes that: Do not have high heels. Have rubber bottoms. Are comfortable and fit you well. Are closed at the toe. Do not wear sandals. If you use a stepladder: Make sure that it is fully opened. Do not climb a closed stepladder. Make sure that both sides of the stepladder are locked into place. Ask someone to hold it for you, if possible. Clearly mark and make sure that you can  see: Any grab bars or handrails. First and last steps. Where the edge of each step is. Use tools that help you move around (mobility aids) if they are needed. These include: Canes. Walkers. Scooters. Crutches. Turn on the lights when you go into a dark area. Replace any light bulbs as soon as they burn out. Set up your furniture so you have a clear path. Avoid moving your furniture around. If any of your floors are uneven, fix them. If there are any pets around you, be aware of where they are. Review your medicines with your doctor. Some medicines can make you feel dizzy. This can increase your chance of falling. Ask your doctor what other things that you can do to help prevent falls. This information is not intended to replace advice given to you by your health care provider. Make sure you discuss any questions you have with your health care provider. Document Released: 08/18/2009 Document Revised: 03/29/2016 Document Reviewed: 11/26/2014 Elsevier Interactive Patient Education  2017 [employer].

## 2021-09-03 ENCOUNTER — Other Ambulatory Visit: Payer: Self-pay | Admitting: Family Medicine

## 2021-09-04 NOTE — Telephone Encounter (Deleted)
Requesting: alprazolam Contract: 07/24/21 UDS: 07/24/21 Last Visit:  Next Visit: Last Refill:  Please Advise

## 2021-09-05 NOTE — Telephone Encounter (Signed)
Requesting: alprazolam Contract: 07/24/21 UDS: 07/24/21 Last Visit: 05/18/21 Next Visit: 09/19/21 Last Refill: 07/24/21  Please Advise

## 2021-09-06 ENCOUNTER — Emergency Department (HOSPITAL_COMMUNITY): Payer: Medicare Other

## 2021-09-06 ENCOUNTER — Other Ambulatory Visit: Payer: Self-pay

## 2021-09-06 ENCOUNTER — Encounter (HOSPITAL_COMMUNITY): Payer: Self-pay | Admitting: *Deleted

## 2021-09-06 ENCOUNTER — Inpatient Hospital Stay (HOSPITAL_COMMUNITY)
Admission: EM | Admit: 2021-09-06 | Discharge: 2021-09-11 | DRG: 177 | Disposition: A | Discharge status: Home / Self Care | Payer: Medicare Other | Attending: Internal Medicine | Admitting: Internal Medicine

## 2021-09-06 DIAGNOSIS — R7989 Other specified abnormal findings of blood chemistry: Secondary | ICD-10-CM | POA: Diagnosis not present

## 2021-09-06 DIAGNOSIS — J449 Chronic obstructive pulmonary disease, unspecified: Secondary | ICD-10-CM | POA: Diagnosis present

## 2021-09-06 DIAGNOSIS — E039 Hypothyroidism, unspecified: Secondary | ICD-10-CM | POA: Diagnosis present

## 2021-09-06 DIAGNOSIS — I10 Essential (primary) hypertension: Secondary | ICD-10-CM | POA: Diagnosis present

## 2021-09-06 DIAGNOSIS — E1159 Type 2 diabetes mellitus with other circulatory complications: Secondary | ICD-10-CM | POA: Diagnosis not present

## 2021-09-06 DIAGNOSIS — Z8673 Personal history of transient ischemic attack (TIA), and cerebral infarction without residual deficits: Secondary | ICD-10-CM | POA: Diagnosis not present

## 2021-09-06 DIAGNOSIS — E1165 Type 2 diabetes mellitus with hyperglycemia: Secondary | ICD-10-CM | POA: Diagnosis present

## 2021-09-06 DIAGNOSIS — R0602 Shortness of breath: Secondary | ICD-10-CM

## 2021-09-06 DIAGNOSIS — E1151 Type 2 diabetes mellitus with diabetic peripheral angiopathy without gangrene: Secondary | ICD-10-CM | POA: Diagnosis not present

## 2021-09-06 DIAGNOSIS — J441 Chronic obstructive pulmonary disease with (acute) exacerbation: Secondary | ICD-10-CM | POA: Diagnosis present

## 2021-09-06 DIAGNOSIS — I2510 Atherosclerotic heart disease of native coronary artery without angina pectoris: Secondary | ICD-10-CM | POA: Diagnosis present

## 2021-09-06 DIAGNOSIS — Z89412 Acquired absence of left great toe: Secondary | ICD-10-CM | POA: Diagnosis not present

## 2021-09-06 DIAGNOSIS — T380X5A Adverse effect of glucocorticoids and synthetic analogues, initial encounter: Secondary | ICD-10-CM | POA: Diagnosis present

## 2021-09-06 DIAGNOSIS — M109 Gout, unspecified: Secondary | ICD-10-CM | POA: Diagnosis present

## 2021-09-06 DIAGNOSIS — Z85828 Personal history of other malignant neoplasm of skin: Secondary | ICD-10-CM | POA: Diagnosis not present

## 2021-09-06 DIAGNOSIS — I152 Hypertension secondary to endocrine disorders: Secondary | ICD-10-CM | POA: Diagnosis not present

## 2021-09-06 DIAGNOSIS — J440 Chronic obstructive pulmonary disease with acute lower respiratory infection: Secondary | ICD-10-CM | POA: Diagnosis present

## 2021-09-06 DIAGNOSIS — Z87891 Personal history of nicotine dependence: Secondary | ICD-10-CM

## 2021-09-06 DIAGNOSIS — F32A Depression, unspecified: Secondary | ICD-10-CM | POA: Diagnosis present

## 2021-09-06 DIAGNOSIS — E1142 Type 2 diabetes mellitus with diabetic polyneuropathy: Secondary | ICD-10-CM | POA: Diagnosis present

## 2021-09-06 DIAGNOSIS — Z28310 Unvaccinated for covid-19: Secondary | ICD-10-CM | POA: Diagnosis not present

## 2021-09-06 DIAGNOSIS — M7989 Other specified soft tissue disorders: Secondary | ICD-10-CM | POA: Diagnosis not present

## 2021-09-06 DIAGNOSIS — U071 COVID-19: Principal | ICD-10-CM | POA: Diagnosis present

## 2021-09-06 DIAGNOSIS — E1149 Type 2 diabetes mellitus with other diabetic neurological complication: Secondary | ICD-10-CM | POA: Diagnosis present

## 2021-09-06 DIAGNOSIS — R0603 Acute respiratory distress: Secondary | ICD-10-CM | POA: Diagnosis not present

## 2021-09-06 DIAGNOSIS — Z833 Family history of diabetes mellitus: Secondary | ICD-10-CM

## 2021-09-06 DIAGNOSIS — Z7982 Long term (current) use of aspirin: Secondary | ICD-10-CM

## 2021-09-06 DIAGNOSIS — J1282 Pneumonia due to coronavirus disease 2019: Secondary | ICD-10-CM | POA: Diagnosis present

## 2021-09-06 DIAGNOSIS — Z79890 Hormone replacement therapy (postmenopausal): Secondary | ICD-10-CM

## 2021-09-06 DIAGNOSIS — E785 Hyperlipidemia, unspecified: Secondary | ICD-10-CM | POA: Diagnosis not present

## 2021-09-06 DIAGNOSIS — Z7902 Long term (current) use of antithrombotics/antiplatelets: Secondary | ICD-10-CM

## 2021-09-06 DIAGNOSIS — Z8616 Personal history of COVID-19: Principal | ICD-10-CM

## 2021-09-06 DIAGNOSIS — E669 Obesity, unspecified: Secondary | ICD-10-CM | POA: Diagnosis present

## 2021-09-06 DIAGNOSIS — Z6834 Body mass index (BMI) 34.0-34.9, adult: Secondary | ICD-10-CM | POA: Diagnosis not present

## 2021-09-06 DIAGNOSIS — F419 Anxiety disorder, unspecified: Secondary | ICD-10-CM | POA: Diagnosis present

## 2021-09-06 DIAGNOSIS — Z823 Family history of stroke: Secondary | ICD-10-CM

## 2021-09-06 DIAGNOSIS — Z79899 Other long term (current) drug therapy: Secondary | ICD-10-CM

## 2021-09-06 DIAGNOSIS — R9431 Abnormal electrocardiogram [ECG] [EKG]: Secondary | ICD-10-CM

## 2021-09-06 DIAGNOSIS — R0902 Hypoxemia: Secondary | ICD-10-CM

## 2021-09-06 DIAGNOSIS — Z8249 Family history of ischemic heart disease and other diseases of the circulatory system: Secondary | ICD-10-CM

## 2021-09-06 DIAGNOSIS — R059 Cough, unspecified: Secondary | ICD-10-CM | POA: Diagnosis not present

## 2021-09-06 DIAGNOSIS — J90 Pleural effusion, not elsewhere classified: Secondary | ICD-10-CM | POA: Diagnosis not present

## 2021-09-06 DIAGNOSIS — Z888 Allergy status to other drugs, medicaments and biological substances status: Secondary | ICD-10-CM

## 2021-09-06 DIAGNOSIS — K219 Gastro-esophageal reflux disease without esophagitis: Secondary | ICD-10-CM | POA: Diagnosis not present

## 2021-09-06 DIAGNOSIS — J9601 Acute respiratory failure with hypoxia: Secondary | ICD-10-CM | POA: Diagnosis present

## 2021-09-06 DIAGNOSIS — R918 Other nonspecific abnormal finding of lung field: Secondary | ICD-10-CM | POA: Diagnosis not present

## 2021-09-06 DIAGNOSIS — Z7984 Long term (current) use of oral hypoglycemic drugs: Secondary | ICD-10-CM

## 2021-09-06 LAB — CBC WITH DIFFERENTIAL/PLATELET
Abs Immature Granulocytes: 0.06 10*3/uL (ref 0.00–0.07)
Basophils Absolute: 0.1 10*3/uL (ref 0.0–0.1)
Basophils Relative: 1 %
Eosinophils Absolute: 0.1 10*3/uL (ref 0.0–0.5)
Eosinophils Relative: 1 %
HCT: 44.9 % (ref 39.0–52.0)
Hemoglobin: 14.9 g/dL (ref 13.0–17.0)
Immature Granulocytes: 1 %
Lymphocytes Relative: 15 %
Lymphs Abs: 1.5 10*3/uL (ref 0.7–4.0)
MCH: 30.9 pg (ref 26.0–34.0)
MCHC: 33.2 g/dL (ref 30.0–36.0)
MCV: 93.2 fL (ref 80.0–100.0)
Monocytes Absolute: 1.1 10*3/uL — ABNORMAL HIGH (ref 0.1–1.0)
Monocytes Relative: 10 %
Neutro Abs: 7.8 10*3/uL — ABNORMAL HIGH (ref 1.7–7.7)
Neutrophils Relative %: 72 %
Platelets: 201 10*3/uL (ref 150–400)
RBC: 4.82 MIL/uL (ref 4.22–5.81)
RDW: 14 % (ref 11.5–15.5)
WBC: 10.6 10*3/uL — ABNORMAL HIGH (ref 4.0–10.5)
nRBC: 0 % (ref 0.0–0.2)

## 2021-09-06 LAB — COMPREHENSIVE METABOLIC PANEL
ALT: 32 U/L (ref 0–44)
AST: 38 U/L (ref 15–41)
Albumin: 4.1 g/dL (ref 3.5–5.0)
Alkaline Phosphatase: 89 U/L (ref 38–126)
Anion gap: 9 (ref 5–15)
BUN: 19 mg/dL (ref 8–23)
CO2: 26 mmol/L (ref 22–32)
Calcium: 8.9 mg/dL (ref 8.9–10.3)
Chloride: 102 mmol/L (ref 98–111)
Creatinine, Ser: 0.82 mg/dL (ref 0.61–1.24)
GFR, Estimated: >60 mL/min (ref >60)
Glucose, Bld: 81 mg/dL (ref 70–99)
Potassium: 3.7 mmol/L (ref 3.5–5.1)
Sodium: 137 mmol/L (ref 135–145)
Total Bilirubin: 0.6 mg/dL (ref 0.3–1.2)
Total Protein: 7.3 g/dL (ref 6.5–8.1)

## 2021-09-06 LAB — RESP PANEL BY RT-PCR (FLU A&B, COVID) ARPGX2
Influenza A by PCR: NEGATIVE
Influenza B by PCR: NEGATIVE
SARS Coronavirus 2 by RT PCR: POSITIVE — AB

## 2021-09-06 LAB — LACTIC ACID, PLASMA: Lactic Acid, Venous: 1 mmol/L (ref 0.5–1.9)

## 2021-09-06 NOTE — ED Provider Notes (Signed)
Emergency Medicine Provider Triage Evaluation Note  BOWDEN BOODY , a 75 y.o. male  was evaluated in triage.  Pt complains of covid positive.  Sore throat, fatigue, congestion, He didn't get covid vaccines.  He was previously on oxygen for six months about 2 years ago after he had covid.  He had tylenol this afternoon.  His oxygen was down to the 90s per repot.   Positive covid test POC at home.   Review of Systems  Positive: Fatigue, congestion, cough, sore throat, hypoxia Negative: Syncope  Physical Exam  BP (!) 165/73 (BP Location: Right Arm)   Pulse 97   Temp 99.3 F (37.4 C) (Oral)   Resp 16   Ht 5\' 9"  (1.753 m)   Wt 104.3 kg   SpO2 91%   BMI 33.97 kg/m  Gen:   Awake, no distress   Resp:  Normal effort  MSK:   Moves extremities without difficulty  Other:  Normal speech.    Medical Decision Making  Medically screening exam initiated at 9:12 PM.  Appropriate orders placed.  [name] was informed that the remainder of the evaluation will be completed by another provider, this initial triage assessment does not replace that evaluation, and the importance of remaining in the ED until their evaluation is complete.  Will order repeat test to ensure he doesn't have coinfection with influenza.    Ollen Gross 09/06/21 2119    Hayden Rasmussen, MD 09/07/21 1029
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
## 2021-09-06 NOTE — ED Triage Notes (Signed)
PT tested positive for Covid at home today.  Symptoms started today . . . Sore throat, congestion, fatigue.  Temp of 103.5 at home.  Wife states sats of 90% at home.    Took 1000 mg tylenol at 1700.

## 2021-09-07 ENCOUNTER — Telehealth: Payer: Medicare Other | Admitting: Internal Medicine

## 2021-09-07 ENCOUNTER — Inpatient Hospital Stay (HOSPITAL_COMMUNITY): Payer: Medicare Other

## 2021-09-07 ENCOUNTER — Telehealth: Payer: Self-pay

## 2021-09-07 ENCOUNTER — Encounter (HOSPITAL_COMMUNITY): Payer: Self-pay | Admitting: Family Medicine

## 2021-09-07 DIAGNOSIS — K219 Gastro-esophageal reflux disease without esophagitis: Secondary | ICD-10-CM | POA: Diagnosis present

## 2021-09-07 DIAGNOSIS — J1282 Pneumonia due to coronavirus disease 2019: Secondary | ICD-10-CM | POA: Diagnosis present

## 2021-09-07 DIAGNOSIS — I152 Hypertension secondary to endocrine disorders: Secondary | ICD-10-CM | POA: Diagnosis present

## 2021-09-07 DIAGNOSIS — E039 Hypothyroidism, unspecified: Secondary | ICD-10-CM | POA: Diagnosis present

## 2021-09-07 DIAGNOSIS — E1151 Type 2 diabetes mellitus with diabetic peripheral angiopathy without gangrene: Secondary | ICD-10-CM | POA: Diagnosis present

## 2021-09-07 DIAGNOSIS — E1159 Type 2 diabetes mellitus with other circulatory complications: Secondary | ICD-10-CM | POA: Diagnosis present

## 2021-09-07 DIAGNOSIS — U071 COVID-19: Secondary | ICD-10-CM | POA: Diagnosis present

## 2021-09-07 DIAGNOSIS — I2510 Atherosclerotic heart disease of native coronary artery without angina pectoris: Secondary | ICD-10-CM | POA: Diagnosis present

## 2021-09-07 DIAGNOSIS — Z8673 Personal history of transient ischemic attack (TIA), and cerebral infarction without residual deficits: Secondary | ICD-10-CM | POA: Diagnosis not present

## 2021-09-07 DIAGNOSIS — M7989 Other specified soft tissue disorders: Secondary | ICD-10-CM

## 2021-09-07 DIAGNOSIS — T380X5A Adverse effect of glucocorticoids and synthetic analogues, initial encounter: Secondary | ICD-10-CM | POA: Diagnosis present

## 2021-09-07 DIAGNOSIS — R9431 Abnormal electrocardiogram [ECG] [EKG]: Secondary | ICD-10-CM

## 2021-09-07 DIAGNOSIS — E1149 Type 2 diabetes mellitus with other diabetic neurological complication: Secondary | ICD-10-CM

## 2021-09-07 DIAGNOSIS — J440 Chronic obstructive pulmonary disease with acute lower respiratory infection: Secondary | ICD-10-CM | POA: Diagnosis present

## 2021-09-07 DIAGNOSIS — Z85828 Personal history of other malignant neoplasm of skin: Secondary | ICD-10-CM | POA: Diagnosis not present

## 2021-09-07 DIAGNOSIS — J449 Chronic obstructive pulmonary disease, unspecified: Secondary | ICD-10-CM | POA: Diagnosis not present

## 2021-09-07 DIAGNOSIS — M109 Gout, unspecified: Secondary | ICD-10-CM | POA: Diagnosis present

## 2021-09-07 DIAGNOSIS — E1142 Type 2 diabetes mellitus with diabetic polyneuropathy: Secondary | ICD-10-CM | POA: Diagnosis present

## 2021-09-07 DIAGNOSIS — E785 Hyperlipidemia, unspecified: Secondary | ICD-10-CM | POA: Diagnosis present

## 2021-09-07 DIAGNOSIS — Z89412 Acquired absence of left great toe: Secondary | ICD-10-CM | POA: Diagnosis not present

## 2021-09-07 DIAGNOSIS — R0902 Hypoxemia: Secondary | ICD-10-CM

## 2021-09-07 DIAGNOSIS — R0603 Acute respiratory distress: Secondary | ICD-10-CM | POA: Diagnosis not present

## 2021-09-07 DIAGNOSIS — Z8616 Personal history of COVID-19: Secondary | ICD-10-CM | POA: Diagnosis not present

## 2021-09-07 DIAGNOSIS — E1165 Type 2 diabetes mellitus with hyperglycemia: Secondary | ICD-10-CM | POA: Diagnosis present

## 2021-09-07 DIAGNOSIS — F32A Depression, unspecified: Secondary | ICD-10-CM | POA: Diagnosis present

## 2021-09-07 DIAGNOSIS — E669 Obesity, unspecified: Secondary | ICD-10-CM | POA: Diagnosis present

## 2021-09-07 DIAGNOSIS — R7989 Other specified abnormal findings of blood chemistry: Secondary | ICD-10-CM | POA: Diagnosis present

## 2021-09-07 DIAGNOSIS — Z28310 Unvaccinated for covid-19: Secondary | ICD-10-CM | POA: Diagnosis not present

## 2021-09-07 DIAGNOSIS — Z6834 Body mass index (BMI) 34.0-34.9, adult: Secondary | ICD-10-CM | POA: Diagnosis not present

## 2021-09-07 DIAGNOSIS — J9601 Acute respiratory failure with hypoxia: Secondary | ICD-10-CM | POA: Diagnosis present

## 2021-09-07 HISTORY — DX: Abnormal electrocardiogram (ECG) (EKG): R94.31

## 2021-09-07 HISTORY — DX: Hypoxemia: R09.02

## 2021-09-07 LAB — COMPREHENSIVE METABOLIC PANEL
ALT: 33 U/L (ref 0–44)
AST: 41 U/L (ref 15–41)
Albumin: 3.7 g/dL (ref 3.5–5.0)
Alkaline Phosphatase: 79 U/L (ref 38–126)
Anion gap: 10 (ref 5–15)
BUN: 17 mg/dL (ref 8–23)
CO2: 25 mmol/L (ref 22–32)
Calcium: 8.7 mg/dL — ABNORMAL LOW (ref 8.9–10.3)
Chloride: 100 mmol/L (ref 98–111)
Creatinine, Ser: 0.92 mg/dL (ref 0.61–1.24)
GFR, Estimated: >60 mL/min (ref >60)
Glucose, Bld: 249 mg/dL — ABNORMAL HIGH (ref 70–99)
Potassium: 4.1 mmol/L (ref 3.5–5.1)
Sodium: 135 mmol/L (ref 135–145)
Total Bilirubin: 0.8 mg/dL (ref 0.3–1.2)
Total Protein: 6.7 g/dL (ref 6.5–8.1)

## 2021-09-07 LAB — FERRITIN: Ferritin: 88 ng/mL (ref 24–336)

## 2021-09-07 LAB — CBG MONITORING, ED
Glucose-Capillary: 241 mg/dL — ABNORMAL HIGH (ref 70–99)
Glucose-Capillary: 307 mg/dL — ABNORMAL HIGH (ref 70–99)

## 2021-09-07 LAB — C-REACTIVE PROTEIN: CRP: 7.6 mg/dL — ABNORMAL HIGH (ref <1.0)

## 2021-09-07 LAB — MAGNESIUM: Magnesium: 1.8 mg/dL (ref 1.7–2.4)

## 2021-09-07 LAB — GLUCOSE, CAPILLARY
Glucose-Capillary: 262 mg/dL — ABNORMAL HIGH (ref 70–99)
Glucose-Capillary: 284 mg/dL — ABNORMAL HIGH (ref 70–99)

## 2021-09-07 LAB — HEMOGLOBIN A1C
Hgb A1c MFr Bld: 7.1 % — ABNORMAL HIGH (ref 4.8–5.6)
Mean Plasma Glucose: 157.07 mg/dL

## 2021-09-07 LAB — PROCALCITONIN: Procalcitonin: 0.38 ng/mL

## 2021-09-07 LAB — D-DIMER, QUANTITATIVE: D-Dimer, Quant: 1.19 ug/mL-FEU — ABNORMAL HIGH (ref 0.00–0.50)

## 2021-09-07 MED ORDER — ASPIRIN EC 81 MG PO TBEC
81.0000 mg | DELAYED_RELEASE_TABLET | Freq: Every day | ORAL | Status: DC
  Administered 2021-09-07 – 2021-09-10 (×4): 81 mg via ORAL
  Filled 2021-09-07 (×4): qty 1

## 2021-09-07 MED ORDER — SODIUM CHLORIDE 0.9 % IV SOLN
100.0000 mg | Freq: Every day | INTRAVENOUS | Status: AC
  Administered 2021-09-08 – 2021-09-11 (×4): 100 mg via INTRAVENOUS
  Filled 2021-09-07 (×4): qty 20

## 2021-09-07 MED ORDER — AMLODIPINE BESYLATE 5 MG PO TABS: 5.0000 mg | ORAL_TABLET | Freq: Every day | ORAL | Status: DC

## 2021-09-07 MED ORDER — MAGNESIUM SULFATE 2 GM/50ML IV SOLN
2.0000 g | Freq: Once | INTRAVENOUS | Status: AC
  Administered 2021-09-07: 2 g via INTRAVENOUS
  Filled 2021-09-07 (×2): qty 50

## 2021-09-07 MED ORDER — DEXAMETHASONE SODIUM PHOSPHATE 10 MG/ML IJ SOLN
6.0000 mg | Freq: Every day | INTRAMUSCULAR | Status: DC
  Filled 2021-09-07: qty 0.6

## 2021-09-07 MED ORDER — ALBUTEROL SULFATE HFA 108 (90 BASE) MCG/ACT IN AERS
2.0000 | INHALATION_SPRAY | RESPIRATORY_TRACT | Status: DC | PRN
  Administered 2021-09-11: 2 via RESPIRATORY_TRACT
  Filled 2021-09-07: qty 6.7

## 2021-09-07 MED ORDER — CLOPIDOGREL BISULFATE 75 MG PO TABS
75.0000 mg | ORAL_TABLET | Freq: Every day | ORAL | Status: DC
  Administered 2021-09-07 – 2021-09-11 (×5): 75 mg via ORAL
  Filled 2021-09-07 (×5): qty 1

## 2021-09-07 MED ORDER — SODIUM CHLORIDE 0.9 % IV SOLN: 100.0000 mg | Freq: Every day | INTRAVENOUS | Status: DC

## 2021-09-07 MED ORDER — LEVOTHYROXINE SODIUM 25 MCG PO TABS
25.0000 ug | ORAL_TABLET | Freq: Every day | ORAL | Status: DC
  Administered 2021-09-07 – 2021-09-11 (×5): 25 ug via ORAL
  Filled 2021-09-07 (×5): qty 1

## 2021-09-07 MED ORDER — CARVEDILOL 12.5 MG PO TABS
12.5000 mg | ORAL_TABLET | Freq: Two times a day (BID) | ORAL | Status: DC
  Administered 2021-09-07 – 2021-09-08 (×4): 12.5 mg via ORAL
  Filled 2021-09-07 (×5): qty 1
  Filled 2021-09-07: qty 4

## 2021-09-07 MED ORDER — FUROSEMIDE 10 MG/ML IJ SOLN
40.0000 mg | Freq: Once | INTRAMUSCULAR | Status: AC
  Administered 2021-09-07: 40 mg via INTRAVENOUS
  Filled 2021-09-07: qty 4

## 2021-09-07 MED ORDER — LACTATED RINGERS IV SOLN: INTRAVENOUS | Status: DC

## 2021-09-07 MED ORDER — DEXAMETHASONE SODIUM PHOSPHATE 10 MG/ML IJ SOLN
10.0000 mg | Freq: Once | INTRAMUSCULAR | Status: AC
  Administered 2021-09-07: 10 mg via INTRAVENOUS
  Filled 2021-09-07: qty 1

## 2021-09-07 MED ORDER — ACETAMINOPHEN 325 MG PO TABS
650.0000 mg | ORAL_TABLET | Freq: Four times a day (QID) | ORAL | Status: DC | PRN
  Administered 2021-09-07 – 2021-09-08 (×2): 650 mg via ORAL
  Filled 2021-09-07 (×2): qty 2

## 2021-09-07 MED ORDER — LATANOPROST 0.005 % OP SOLN
1.0000 [drp] | Freq: Two times a day (BID) | OPHTHALMIC | Status: DC
  Administered 2021-09-07 – 2021-09-11 (×9): 1 [drp] via OPHTHALMIC
  Filled 2021-09-07: qty 2.5

## 2021-09-07 MED ORDER — ATORVASTATIN CALCIUM 80 MG PO TABS
80.0000 mg | ORAL_TABLET | Freq: Every day | ORAL | Status: DC
  Administered 2021-09-07 – 2021-09-11 (×5): 80 mg via ORAL
  Filled 2021-09-07 (×5): qty 1

## 2021-09-07 MED ORDER — KETOROLAC TROMETHAMINE 15 MG/ML IJ SOLN
15.0000 mg | Freq: Once | INTRAMUSCULAR | Status: AC
  Administered 2021-09-07: 15 mg via INTRAVENOUS
  Filled 2021-09-07: qty 1

## 2021-09-07 MED ORDER — INSULIN ASPART 100 UNIT/ML IJ SOLN
0.0000 [IU] | Freq: Three times a day (TID) | INTRAMUSCULAR | Status: DC
  Administered 2021-09-07 (×2): 5 [IU] via SUBCUTANEOUS
  Administered 2021-09-07: 7 [IU] via SUBCUTANEOUS
  Administered 2021-09-07 – 2021-09-08 (×2): 3 [IU] via SUBCUTANEOUS
  Administered 2021-09-08: 2 [IU] via SUBCUTANEOUS

## 2021-09-07 MED ORDER — AMLODIPINE BESYLATE 5 MG PO TABS
7.5000 mg | ORAL_TABLET | Freq: Every day | ORAL | Status: DC
  Administered 2021-09-07 – 2021-09-08 (×2): 7.5 mg via ORAL
  Filled 2021-09-07 (×3): qty 2

## 2021-09-07 MED ORDER — UMECLIDINIUM BROMIDE 62.5 MCG/ACT IN AEPB
1.0000 | INHALATION_SPRAY | Freq: Every day | RESPIRATORY_TRACT | Status: DC
  Administered 2021-09-07 – 2021-09-11 (×5): 1 via RESPIRATORY_TRACT
  Filled 2021-09-07: qty 7

## 2021-09-07 MED ORDER — SENNOSIDES-DOCUSATE SODIUM 8.6-50 MG PO TABS: 1.0000 | ORAL_TABLET | Freq: Every evening | ORAL | Status: DC | PRN

## 2021-09-07 MED ORDER — OXYCODONE HCL 5 MG PO TABS
5.0000 mg | ORAL_TABLET | Freq: Four times a day (QID) | ORAL | Status: DC | PRN
  Administered 2021-09-07: 5 mg via ORAL
  Filled 2021-09-07: qty 1

## 2021-09-07 MED ORDER — DEXAMETHASONE 4 MG PO TABS: 6.0000 mg | ORAL_TABLET | Freq: Every day | ORAL | Status: DC

## 2021-09-07 MED ORDER — BENZONATATE 100 MG PO CAPS
200.0000 mg | ORAL_CAPSULE | Freq: Three times a day (TID) | ORAL | Status: DC | PRN
  Administered 2021-09-07 – 2021-09-09 (×4): 200 mg via ORAL
  Filled 2021-09-07 (×4): qty 2

## 2021-09-07 MED ORDER — LOSARTAN POTASSIUM 50 MG PO TABS
50.0000 mg | ORAL_TABLET | Freq: Every day | ORAL | Status: DC
  Administered 2021-09-07 – 2021-09-09 (×3): 50 mg via ORAL
  Filled 2021-09-07 (×3): qty 1

## 2021-09-07 MED ORDER — SODIUM CHLORIDE 0.9 % IV SOLN: 200.0000 mg | Freq: Once | INTRAVENOUS | Status: DC

## 2021-09-07 MED ORDER — SODIUM CHLORIDE 0.9 % IV SOLN
200.0000 mg | Freq: Once | INTRAVENOUS | Status: AC
  Administered 2021-09-07: 200 mg via INTRAVENOUS
  Filled 2021-09-07: qty 40

## 2021-09-07 MED ORDER — ENOXAPARIN SODIUM 40 MG/0.4ML IJ SOSY
40.0000 mg | PREFILLED_SYRINGE | Freq: Every day | INTRAMUSCULAR | Status: DC
  Administered 2021-09-07 – 2021-09-11 (×5): 40 mg via SUBCUTANEOUS
  Filled 2021-09-07 (×4): qty 0.4

## 2021-09-07 MED ORDER — ALPRAZOLAM 0.25 MG PO TABS
0.2500 mg | ORAL_TABLET | Freq: Two times a day (BID) | ORAL | Status: DC | PRN
  Administered 2021-09-07 – 2021-09-10 (×4): 0.25 mg via ORAL
  Filled 2021-09-07 (×4): qty 1

## 2021-09-07 MED ORDER — SERTRALINE HCL 50 MG PO TABS: 50.0000 mg | ORAL_TABLET | Freq: Every day | ORAL | Status: DC

## 2021-09-07 NOTE — Progress Notes (Signed)
Bilateral lower extremity venous duplex has been completed. Preliminary results can be found in CV Proc through chart review.  Results were given to the patient's nurse, Peter Congo.  09/07/21 1:45 PM Carlos Levering RVT

## 2021-09-07 NOTE — Telephone Encounter (Signed)
Patient currently in ER, appears to have plans for admission.   [facility] Night - ClientTELEPHONE ADVICE RECORDAccessNurse PatientName:[name] PhoneNumber:[phone](Primary) Initial Comment Caller states her husband tested positive for Covid19 today. The pt has a 103.5 temp. Pt is high risk. Oxygen is 90 Translation No Nurse Assessment Nurse: Glean Salvo, RN, [place] Date/Time (Eastern Time): 09/06/2021 6:24:11 PM Confirm and document reason for call. If symptomatic, describe symptoms. ---Caller states her husband tested positive for Covid19 today with an at home test. The pt has a T 103.5 Forehead. Pt is high risk. Oxygen is 90%. Denies Difficulty breathing Does the patient have any new or worsening symptoms? ---Yes Will a triage be completed? ---Yes Related visit to physician within the last 2 weeks? ---No Does the PT have any chronic conditions? (i.e. diabetes, asthma, this includes High risk factors for pregnancy, etc.) ---Yes List chronic conditions. ---Diabetes, HTN, COPD, Is this a behavioral health or substance abuse call? ---No Guidelines Guideline Title Affirmed Question Affirmed Notes Nurse Date/Time (Eastern Time) COVID-19 - Diagnosed or Suspected Oxygen level (e.g., pulse oximetry) 90 percent or lower Walker-Foster, RN, [place] 09/06/2021 6:26:30 PM Disp. Time Eilene Ghazi Time) Disposition Final User PLEASE NOTE: All timestamps contained within this report are represented as [place] Standard Time. CONFIDENTIALTY NOTICE: This fax transmission is intended only for the addressee. It contains information that is legally privileged, confidential or otherwise protected from use or disclosure. If you are not the intended recipient, you are strictly prohibited from reviewing, disclosing, copying using or disseminating any of this information or taking any action in reliance on or regarding this information. If you have received this fax in error,  please notify us immediately by telephone so that we can arrange for its return to [name]. Phone: [phone], Toll-Free: [phone], Fax: [phone] Page: 2 of 2 Call Id: [id number] 09/06/2021 6:29:14 PM Go to ED Now Yes Glean Salvo, RN, Hayden Rasmussen Caller Disagree/Comply Comply Caller Understands Yes PreDisposition Go to ED Care Advice Given Per Guideline GO TO ED NOW: * Leave now. Drive carefully. CALL EMS 911 IF: * Severe difficulty breathing occurs * Lips or face turns blue CARE ADVICE given per COVID-19 - DIAGNOSED OR SUSPECTED (Adult) guideline. Referrals [facility]

## 2021-09-07 NOTE — Plan of Care (Signed)
New admit. Arrived via stretcher. Alert and oriented x 4. Denies any pain or discomfort.  Patient arrived as Full Code, provider paged to change status with patient.  Patient stated he is a DNR.  Lungs clear bilaterally and diminished in the bases. Ambulates with no difficulty.  Patient on 5L via .

## 2021-09-07 NOTE — ED Notes (Signed)
RN sent secure chat to MD to clarify IVF and lasix admin. Dr Sloan Leiter responded to cancel IVF and give IV lasix. Order d/c'd for LR.

## 2021-09-07 NOTE — Progress Notes (Signed)
PROGRESS NOTE        PATIENT DETAILS Name: [name] Age: 75 y.o. Sex: male Date of Birth: [birth year] Admit Date: 09/06/2021 Admitting Physician Eben Burow, MD NGE:[id number], Bonnita Levan, MD  Brief Narrative: Patient is a 75 y.o. male with history of COPD, DM-2, HTN, CAD, PAD who presented with fever, cough, shortness of breath-found to have hypoxia due to probable COVID-19 pneumonia.  Subjective: Will lying comfortably in bed-but gets short of breath with movement.  When O2 removed-O2 saturation drops to the mid 80s-requires around 3-4 L of oxygen to maintain saturation in the low 90s.  Objective: Vitals: Blood pressure 135/66, pulse 89, temperature 99 F (37.2 C), temperature source Oral, resp. rate (!) 22, height 5\' 9"  (1.753 m), weight 104.3 kg, SpO2 94 %.   Exam: Gen Exam:Alert awake-not in any distress HEENT:atraumatic, normocephalic Chest: Few bibasilar rales. CVS:S1S2 regular Abdomen:soft non tender, non distended Extremities:+ edema Neurology: Non focal Skin: no rash  Pertinent Labs/Radiology: Recent Labs  Lab 09/06/21 2136 09/07/21 0700  WBC 10.6*  --   HGB 14.9  --   PLT 201  --   NA 137 135  K 3.7 4.1  CREATININE 0.82 0.92  AST 38 41  ALT 32 33  ALKPHOS 89 79  BILITOT 0.6 0.8   Recent Labs    09/07/21 0700  DDIMER 1.19*  FERRITIN 88  CRP 7.6*   11/2>>CXR: No obvious pneumonia  Assessment/Plan: Acute hypoxic respiratory failure due to presumed COVID-19 pneumonia: Requiring 3-4 L of oxygen to maintain O2 saturation in the low 90s-even though chest x-ray does not show obvious pneumonia-I suspect patient has pneumonia and not visible on chest x-ray.  Patient does have some mild lower extremity edema-we will give 1 dose of IV Lasix-and plan on checking echo and lower extremity Dopplers to rule out DVT.  Plan is to continue steroid/Remdesivir-follow clinical course and repeat chest x-ray tomorrow morning.  DM-2: Monitor  CBGs on SSI-follow and adjust.  Recent Labs    09/07/21 0813  GLUCAP 241*    HTN: BP stable-continue Norvasc, Coreg and Cozaar-follow and adjust.  COPD: Not in exacerbation-continue bronchodilators  Hypothyroidism: Continue Synthroid  HLD: Continue statin  History of PAD: Stable-continue dual antiplatelet agents/statin.  Prolonged QTC: Repeat EKG tomorrow-hold Zoloft.  K stable-we will supplement Mg.  Obesity Estimated body mass index is 33.97 kg/m as calculated from the following:   Height as of this encounter: 5\' 9"  (1.753 m).   Weight as of this encounter: 104.3 kg.   Procedures: None Consults: None DVT Prophylaxis: Lovenox Code Status:Full code  Family Communication: None at bedside  Time spent: 35 minutes-Greater than 50% of this time was spent in counseling, explanation of diagnosis, planning of further management, and coordination of care.   Disposition Plan: Status is: Inpatient  Remains inpatient appropriate because: Hypoxia-COVID-19 pneumonia-needs IV antimicrobial/IV steroids.   Diet: Diet Order             Diet heart healthy/carb modified Room service appropriate? Yes; Fluid consistency: Thin  Diet effective now                     Antimicrobial agents: Anti-infectives (From admission, onward)    Start     Dose/Rate Route Frequency Ordered Stop   09/08/21 1000  remdesivir 100 mg in sodium chloride 0.9 % 100  mL IVPB  Status:  Discontinued       See Hyperspace for full Linked Orders Report.   100 mg 200 mL/hr over 30 Minutes Intravenous Daily 09/07/21 0658 09/07/21 0659   09/08/21 1000  remdesivir 100 mg in sodium chloride 0.9 % 100 mL IVPB       See Hyperspace for full Linked Orders Report.   100 mg 200 mL/hr over 30 Minutes Intravenous Daily 09/07/21 0128 09/12/21 0959   09/07/21 0658  remdesivir 200 mg in sodium chloride 0.9% 250 mL IVPB  Status:  Discontinued       See Hyperspace for full Linked Orders Report.   200 mg 580 mL/hr  over 30 Minutes Intravenous Once 09/07/21 0658 09/07/21 0659   09/07/21 0300  remdesivir 200 mg in sodium chloride 0.9% 250 mL IVPB       See Hyperspace for full Linked Orders Report.   200 mg 580 mL/hr over 30 Minutes Intravenous Once 09/07/21 0128 09/07/21 0415        MEDICATIONS: Scheduled Meds:  amLODipine  7.5 mg Oral Daily   aspirin EC  81 mg Oral QHS   atorvastatin  80 mg Oral Daily   carvedilol  12.5 mg Oral BID WC   clopidogrel  75 mg Oral Q breakfast   [START ON 09/08/2021] dexamethasone (DECADRON) injection  6 mg Intravenous Daily   enoxaparin (LOVENOX) injection  40 mg Subcutaneous Daily   insulin aspart  0-9 Units Subcutaneous TID WC & HS   latanoprost  1 drop Both Eyes BID   levothyroxine  25 mcg Oral Daily   losartan  50 mg Oral Daily   umeclidinium bromide  1 puff Inhalation Daily   Continuous Infusions:  magnesium sulfate bolus IVPB     [START ON 09/08/2021] remdesivir 100 mg in NS 100 mL     PRN Meds:.acetaminophen, albuterol, ALPRAZolam, benzonatate, oxyCODONE, senna-docusate   I have personally reviewed following labs and imaging studies  LABORATORY DATA: CBC: Recent Labs  Lab 09/06/21 2136  WBC 10.6*  NEUTROABS 7.8*  HGB 14.9  HCT 44.9  MCV 93.2  PLT 235    Basic Metabolic Panel: Recent Labs  Lab 09/06/21 2136 09/07/21 0700  NA 137 135  K 3.7 4.1  CL 102 100  CO2 26 25  GLUCOSE 81 249*  BUN 19 17  CREATININE 0.82 0.92  CALCIUM 8.9 8.7*  MG  --  1.8    GFR: Estimated Creatinine Clearance: 83.8 mL/min (by C-G formula based on SCr of 0.92 mg/dL).  Liver Function Tests: Recent Labs  Lab 09/06/21 2136 09/07/21 0700  AST 38 41  ALT 32 33  ALKPHOS 89 79  BILITOT 0.6 0.8  PROT 7.3 6.7  ALBUMIN 4.1 3.7   No results for input(s): LIPASE, AMYLASE in the last 168 hours. No results for input(s): AMMONIA in the last 168 hours.  Coagulation Profile: No results for input(s): INR, PROTIME in the last 168 hours.  Cardiac  Enzymes: No results for input(s): CKTOTAL, CKMB, CKMBINDEX, TROPONINI in the last 168 hours.  BNP (last 3 results) No results for input(s): PROBNP in the last 8760 hours.  Lipid Profile: No results for input(s): CHOL, HDL, LDLCALC, TRIG, CHOLHDL, LDLDIRECT in the last 72 hours.  Thyroid Function Tests: No results for input(s): TSH, T4TOTAL, FREET4, T3FREE, THYROIDAB in the last 72 hours.  Anemia Panel: Recent Labs    09/07/21 0700  FERRITIN 88    Urine analysis:    Component Value Date/Time  BILIRUBINUR Small 07/15/2015 1103   PROTEINUR 100 07/15/2015 1103   UROBILINOGEN 0.2 07/15/2015 1103   NITRITE Negative 07/15/2015 1103   LEUKOCYTESUR Negative 07/15/2015 1103    Sepsis Labs: Lactic Acid, Venous    Component Value Date/Time   LATICACIDVEN 1.0 09/06/2021 2136    MICROBIOLOGY: Recent Results (from the past 240 hour(s))  Resp Panel by RT-PCR (Flu A&B, Covid) Nasopharyngeal Swab     Status: Abnormal   Collection Time: 09/06/21  9:16 PM   Specimen: Nasopharyngeal Swab; Nasopharyngeal(NP) swabs in vial transport medium  Result Value Ref Range Status   SARS Coronavirus 2 by RT PCR POSITIVE (A) NEGATIVE Final    Comment: RESULT CALLED TO, READ BACK BY AND VERIFIED WITH: RN Taygen M. 09/06/21@23 :03 BY TW (NOTE) SARS-CoV-2 target nucleic acids are DETECTED.  The SARS-CoV-2 RNA is generally detectable in upper respiratory specimens during the acute phase of infection. Positive results are indicative of the presence of the identified virus, but do not rule out bacterial infection or co-infection with other pathogens not detected by the test. Clinical correlation with patient history and other diagnostic information is necessary to determine patient infection status. The expected result is Negative.  Fact Sheet for Patients: EntrepreneurPulse.com.au  Fact Sheet for Healthcare Providers: IncredibleEmployment.be  This test is not  yet approved or cleared by the [place] FDA and  has been authorized for detection and/or diagnosis of SARS-CoV-2 by FDA under an Emergency Use Authorization (EUA).  This EUA will remain in effect (meaning this test can be u sed) for the duration of  the COVID-19 declaration under Section 564(b)(1) of the Act, 21 U.S.C. section 360bbb-3(b)(1), unless the authorization is terminated or revoked sooner.     Influenza A by PCR NEGATIVE NEGATIVE Final   Influenza B by PCR NEGATIVE NEGATIVE Final    Comment: (NOTE) The Xpert Xpress SARS-CoV-2/FLU/RSV plus assay is intended as an aid in the diagnosis of influenza from Nasopharyngeal swab specimens and should not be used as a sole basis for treatment. Nasal washings and aspirates are unacceptable for Xpert Xpress SARS-CoV-2/FLU/RSV testing.  Fact Sheet for Patients: EntrepreneurPulse.com.au  Fact Sheet for Healthcare Providers: IncredibleEmployment.be  This test is not yet approved or cleared by the [place] FDA and has been authorized for detection and/or diagnosis of SARS-CoV-2 by FDA under an Emergency Use Authorization (EUA). This EUA will remain in effect (meaning this test can be used) for the duration of the COVID-19 declaration under Section 564(b)(1) of the Act, 21 U.S.C. section 360bbb-3(b)(1), unless the authorization is terminated or revoked.  Performed at [facility], [place] [address] [place], [place] [address]     RADIOLOGY STUDIES/RESULTS: DG Chest 2 View  Result Date: 09/06/2021 CLINICAL DATA:  Cough, COVID positive EXAM: CHEST - 2 VIEW COMPARISON:  12/04/2019 FINDINGS: Lungs are clear.  No pleural effusion or pneumothorax. The heart is normal in size.  Thoracic aortic atherosclerosis. Degenerative changes of the visualized thoracolumbar spine. IMPRESSION: Normal chest radiographs in this patient with known COVID. Electronically Signed   By: Julian Hy M.D.    On: 09/06/2021 22:13     LOS: 0 days   Oren Binet, MD  [facility]    To contact the attending provider between 7A-7P or the covering provider during after hours 7P-7A, please log into the web site [email or web address] and access using universal [place] password for that web site. If you do not have the password, please call the hospital operator.  09/07/2021, 12:20 PM
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
## 2021-09-08 ENCOUNTER — Inpatient Hospital Stay (HOSPITAL_COMMUNITY): Payer: Medicare Other

## 2021-09-08 ENCOUNTER — Encounter: Payer: Medicare Other | Admitting: Podiatry

## 2021-09-08 DIAGNOSIS — R0603 Acute respiratory distress: Secondary | ICD-10-CM

## 2021-09-08 LAB — ECHOCARDIOGRAM COMPLETE
AR max vel: 2.1 cm2
AV Peak grad: 5.7 mmHg
Ao pk vel: 1.19 m/s
Area-P 1/2: 3.51 cm2
Calc EF: 53.8 %
Height: 69 in
S' Lateral: 4.1 cm
Single Plane A2C EF: 59.4 %
Single Plane A4C EF: 51.7 %
Weight: 3702.11 oz

## 2021-09-08 LAB — CBC WITH DIFFERENTIAL/PLATELET
Abs Immature Granulocytes: 0.1 10*3/uL — ABNORMAL HIGH (ref 0.00–0.07)
Basophils Absolute: 0 10*3/uL (ref 0.0–0.1)
Basophils Relative: 0 %
Eosinophils Absolute: 0 10*3/uL (ref 0.0–0.5)
Eosinophils Relative: 0 %
HCT: 43.6 % (ref 39.0–52.0)
Hemoglobin: 14.7 g/dL (ref 13.0–17.0)
Immature Granulocytes: 1 %
Lymphocytes Relative: 12 %
Lymphs Abs: 1.7 10*3/uL (ref 0.7–4.0)
MCH: 31 pg (ref 26.0–34.0)
MCHC: 33.7 g/dL (ref 30.0–36.0)
MCV: 92 fL (ref 80.0–100.0)
Monocytes Absolute: 1.1 10*3/uL — ABNORMAL HIGH (ref 0.1–1.0)
Monocytes Relative: 8 %
Neutro Abs: 11.2 10*3/uL — ABNORMAL HIGH (ref 1.7–7.7)
Neutrophils Relative %: 79 %
Platelets: 178 10*3/uL (ref 150–400)
RBC: 4.74 MIL/uL (ref 4.22–5.81)
RDW: 14.2 % (ref 11.5–15.5)
WBC: 14.2 10*3/uL — ABNORMAL HIGH (ref 4.0–10.5)
nRBC: 0 % (ref 0.0–0.2)

## 2021-09-08 LAB — COMPREHENSIVE METABOLIC PANEL
ALT: 29 U/L (ref 0–44)
AST: 26 U/L (ref 15–41)
Albumin: 3.3 g/dL — ABNORMAL LOW (ref 3.5–5.0)
Alkaline Phosphatase: 74 U/L (ref 38–126)
Anion gap: 8 (ref 5–15)
BUN: 25 mg/dL — ABNORMAL HIGH (ref 8–23)
CO2: 28 mmol/L (ref 22–32)
Calcium: 8.8 mg/dL — ABNORMAL LOW (ref 8.9–10.3)
Chloride: 100 mmol/L (ref 98–111)
Creatinine, Ser: 0.91 mg/dL (ref 0.61–1.24)
GFR, Estimated: >60 mL/min (ref >60)
Glucose, Bld: 174 mg/dL — ABNORMAL HIGH (ref 70–99)
Potassium: 4.1 mmol/L (ref 3.5–5.1)
Sodium: 136 mmol/L (ref 135–145)
Total Bilirubin: 1 mg/dL (ref 0.3–1.2)
Total Protein: 6.6 g/dL (ref 6.5–8.1)

## 2021-09-08 LAB — D-DIMER, QUANTITATIVE: D-Dimer, Quant: 1.11 ug/mL-FEU — ABNORMAL HIGH (ref 0.00–0.50)

## 2021-09-08 LAB — FERRITIN: Ferritin: 130 ng/mL (ref 24–336)

## 2021-09-08 LAB — BRAIN NATRIURETIC PEPTIDE: B Natriuretic Peptide: 70.7 pg/mL (ref 0.0–100.0)

## 2021-09-08 LAB — PROCALCITONIN: Procalcitonin: 1.16 ng/mL

## 2021-09-08 LAB — GLUCOSE, CAPILLARY
Glucose-Capillary: 154 mg/dL — ABNORMAL HIGH (ref 70–99)
Glucose-Capillary: 250 mg/dL — ABNORMAL HIGH (ref 70–99)
Glucose-Capillary: 386 mg/dL — ABNORMAL HIGH (ref 70–99)

## 2021-09-08 LAB — MAGNESIUM: Magnesium: 2.3 mg/dL (ref 1.7–2.4)

## 2021-09-08 LAB — C-REACTIVE PROTEIN: CRP: 16.3 mg/dL — ABNORMAL HIGH (ref <1.0)

## 2021-09-08 MED ORDER — SODIUM CHLORIDE 0.9 % IV SOLN
2.0000 g | INTRAVENOUS | Status: DC
  Administered 2021-09-08 – 2021-09-10 (×3): 2 g via INTRAVENOUS
  Filled 2021-09-08 (×3): qty 20

## 2021-09-08 MED ORDER — PERFLUTREN LIPID MICROSPHERE
1.0000 mL | INTRAVENOUS | Status: AC | PRN
  Administered 2021-09-08: 2 mL via INTRAVENOUS
  Filled 2021-09-08: qty 10

## 2021-09-08 MED ORDER — DOXYCYCLINE HYCLATE 100 MG PO TABS
100.0000 mg | ORAL_TABLET | Freq: Two times a day (BID) | ORAL | Status: DC
  Administered 2021-09-08 – 2021-09-11 (×6): 100 mg via ORAL
  Filled 2021-09-08 (×6): qty 1

## 2021-09-08 MED ORDER — METHYLPREDNISOLONE SODIUM SUCC 125 MG IJ SOLR
100.0000 mg | Freq: Every day | INTRAMUSCULAR | Status: DC
  Administered 2021-09-08 – 2021-09-09 (×2): 100 mg via INTRAVENOUS
  Filled 2021-09-08 (×2): qty 2

## 2021-09-08 MED ORDER — INSULIN ASPART 100 UNIT/ML IJ SOLN
0.0000 [IU] | Freq: Three times a day (TID) | INTRAMUSCULAR | Status: DC
  Administered 2021-09-08: 20 [IU] via SUBCUTANEOUS
  Administered 2021-09-09: 7 [IU] via SUBCUTANEOUS
  Administered 2021-09-09: 20 [IU] via SUBCUTANEOUS
  Administered 2021-09-09: 4 [IU] via SUBCUTANEOUS
  Administered 2021-09-10: 11 [IU] via SUBCUTANEOUS

## 2021-09-08 NOTE — Progress Notes (Signed)
Inpatient Diabetes Program Recommendations  AACE/ADA: New Consensus Statement on Inpatient Glycemic Control (2015)  Target Ranges:  Prepandial:   less than 140 mg/dL      Peak postprandial:   less than 180 mg/dL (1-2 hours)      Critically ill patients:  140 - 180 mg/dL   Lab Results  Component Value Date   GLUCAP 154 (H) 09/08/2021   HGBA1C 7.1 (H) 09/07/2021    Review of Glycemic Control Results for NORV[place]LE, DANI (MRN [id number]) as of 09/08/2021 11:08  Ref. Range 09/07/2021 12:46 09/07/2021 18:38 09/07/2021 20:53 09/08/2021 06:39  Glucose-Capillary Latest Ref Range: 70 - 99 mg/dL 307 (H) 262 (H) 284 (H) 154 (H)   Diabetes history: Type 2 DM Outpatient Diabetes medications: Amaryl 2 mg BID Current orders for Inpatient glycemic control: Novolog 0-9 units TID & HS Decadron 6 mg qd, Solumedrol 100 mg BID  Inpatient Diabetes Program Recommendations:    In the setting of steroids, consider adding Novolog 3 units TID (assuming patient is consuming >50% of meals).   Thanks, Bronson Curb, MSN, RNC-OB Diabetes Coordinator [phone] (8a-5p)

## 2021-09-08 NOTE — Progress Notes (Signed)
PROGRESS NOTE        PATIENT DETAILS Name: [name] Age: 75 y.o. Sex: male Date of Birth: 1946/01/04 Admit Date: 09/06/2021 Admitting Physician Eben Burow, MD ZOX:[id number], Bonnita Levan, MD  Brief Narrative: Patient is a 75 y.o. male with history of COPD, DM-2, HTN, CAD, PAD who presented with fever, cough, shortness of breath-found to have hypoxia due to probable COVID-19 pneumonia.    Patient is unvaccinated-however he did have COVID-19 infection a few years back.  Subjective: Feels better-still on 4 L of oxygen this morning.  Objective: Vitals: Blood pressure 133/69, pulse 72, temperature 98.6 F (37 C), resp. rate 16, height 5\' 9"  (1.753 m), weight 105 kg, SpO2 95 %.   Exam: Gen Exam:Alert awake-not in any distress HEENT:atraumatic, normocephalic Chest: B/L clear to auscultation anteriorly CVS:S1S2 regular Abdomen:soft non tender, non distended Extremities:no edema Neurology: Non focal Skin: no rash   Pertinent Labs/Radiology: Recent Labs  Lab 09/06/21 2136 09/07/21 0700 09/08/21 0417  WBC 10.6*  --  14.2*  HGB 14.9  --  14.7  PLT 201  --  178  NA 137 135 136  K 3.7 4.1 4.1  CREATININE 0.82 0.92 0.91  AST 38 41 26  ALT 32 33 29  ALKPHOS 89 79 74  BILITOT 0.6 0.8 1.0    Recent Labs    09/07/21 0700 09/08/21 0417  DDIMER 1.19* 1.11*  FERRITIN 88 130  CRP 7.6* 16.3*    11/2>>CXR: No obvious pneumonia 11/3>> lower extremity Doppler: No DVT 11/4>> CXR: Left-sided infiltrates. 11/4>> Echo: Pending  Assessment/Plan: Acute hypoxic respiratory failure due to COVID-19 pneumonia: Clinically improved but still remains on 4 L of oxygen.  CRP significantly elevated today CXR on 11/4 now shows mostly left-sided infiltrates.  I still suspect this is COVID-19 pneumonitis-but given mostly left-sided infiltrates and minimally elevated procalcitonin levels-I will plan on starting Rocephin/Doxy x5 days and continuing Remdesivir and  steroids (but switching to Solu-Medrol).  Have asked nursing staff to see if we can titrate down FiO2 as tolerated.    Elevated D-dimer: Due to COVID-19 infection-Dopplers negative for DVT.  On prophylactic Lovenox.  Low suspicion for PE-do not think we need to pursue imaging of the chest-unless hypoxia worsens or D-dimer jumps unexpectedly.  DM-2 with uncontrolled hyperglycemia due to steroids: Change SSI to resistant scale and follow CBGs.  If still persistently--May need Levemir/Semglee while on steroids.  Recent Labs    09/07/21 2053 09/08/21 0639 09/08/21 1135  GLUCAP 284* 154* 250*     HTN: BP stable-continue Norvasc, Coreg and Cozaar-follow and adjust.  COPD: Not in exacerbation-continue bronchodilators  Hypothyroidism: Continue Synthroid  HLD: Continue statin  History of PAD: Stable-continue dual antiplatelet agents/statin.  Prolonged QTC: Improved-hold Zoloft for a few more days-K/Mg stable.    Obesity Estimated body mass index is 34.17 kg/m as calculated from the following:   Height as of this encounter: 5\' 9"  (1.753 m).   Weight as of this encounter: 105 kg.   Procedures: None Consults: None DVT Prophylaxis: Lovenox Code Status:Full code  Family Communication: Spouse at bedside  Time spent: 58 minutes-Greater than 50% of this time was spent in counseling, explanation of diagnosis, planning of further management, and coordination of care.   Disposition Plan: Status is: Inpatient  Remains inpatient appropriate because: Hypoxia-COVID-19 pneumonia-needs IV antimicrobial/IV steroids.   Diet: Diet Order  Diet heart healthy/carb modified Room service appropriate? Yes; Fluid consistency: Thin  Diet effective now                     Antimicrobial agents: Anti-infectives (From admission, onward)    Start     Dose/Rate Route Frequency Ordered Stop   09/08/21 1000  remdesivir 100 mg in sodium chloride 0.9 % 100 mL IVPB  Status:   Discontinued       See Hyperspace for full Linked Orders Report.   100 mg 200 mL/hr over 30 Minutes Intravenous Daily 09/07/21 0658 09/07/21 0659   09/08/21 1000  remdesivir 100 mg in sodium chloride 0.9 % 100 mL IVPB       See Hyperspace for full Linked Orders Report.   100 mg 200 mL/hr over 30 Minutes Intravenous Daily 09/07/21 0128 09/12/21 0959   09/07/21 0658  remdesivir 200 mg in sodium chloride 0.9% 250 mL IVPB  Status:  Discontinued       See Hyperspace for full Linked Orders Report.   200 mg 580 mL/hr over 30 Minutes Intravenous Once 09/07/21 0658 09/07/21 0659   09/07/21 0300  remdesivir 200 mg in sodium chloride 0.9% 250 mL IVPB       See Hyperspace for full Linked Orders Report.   200 mg 580 mL/hr over 30 Minutes Intravenous Once 09/07/21 0128 09/07/21 0415        MEDICATIONS: Scheduled Meds:  amLODipine  7.5 mg Oral Daily   aspirin EC  81 mg Oral QHS   atorvastatin  80 mg Oral Daily   carvedilol  12.5 mg Oral BID WC   clopidogrel  75 mg Oral Q breakfast   enoxaparin (LOVENOX) injection  40 mg Subcutaneous Daily   insulin aspart  0-9 Units Subcutaneous TID WC & HS   latanoprost  1 drop Both Eyes BID   levothyroxine  25 mcg Oral Daily   losartan  50 mg Oral Daily   methylPREDNISolone (SOLU-MEDROL) injection  100 mg Intravenous Daily   umeclidinium bromide  1 puff Inhalation Daily   Continuous Infusions:  remdesivir 100 mg in NS 100 mL 100 mg (09/08/21 0932)   PRN Meds:.acetaminophen, albuterol, ALPRAZolam, benzonatate, oxyCODONE, perflutren lipid microspheres (DEFINITY) IV suspension, senna-docusate   I have personally reviewed following labs and imaging studies  LABORATORY DATA: CBC: Recent Labs  Lab 09/06/21 2136 09/08/21 0417  WBC 10.6* 14.2*  NEUTROABS 7.8* 11.2*  HGB 14.9 14.7  HCT 44.9 43.6  MCV 93.2 92.0  PLT 201 178     Basic Metabolic Panel: Recent Labs  Lab 09/06/21 2136 09/07/21 0700 09/08/21 0417  NA 137 135 136  K 3.7 4.1 4.1   CL 102 100 100  CO2 26 25 28   GLUCOSE 81 249* 174*  BUN 19 17 25*  CREATININE 0.82 0.92 0.91  CALCIUM 8.9 8.7* 8.8*  MG  --  1.8 2.3     GFR: Estimated Creatinine Clearance: 85 mL/min (by C-G formula based on SCr of 0.91 mg/dL).  Liver Function Tests: Recent Labs  Lab 09/06/21 2136 09/07/21 0700 09/08/21 0417  AST 38 41 26  ALT 32 33 29  ALKPHOS 89 79 74  BILITOT 0.6 0.8 1.0  PROT 7.3 6.7 6.6  ALBUMIN 4.1 3.7 3.3*    No results for input(s): LIPASE, AMYLASE in the last 168 hours. No results for input(s): AMMONIA in the last 168 hours.  Coagulation Profile: No results for input(s): INR, PROTIME in the last 168 hours.  Cardiac Enzymes: No results for input(s): CKTOTAL, CKMB, CKMBINDEX, TROPONINI in the last 168 hours.  BNP (last 3 results) No results for input(s): PROBNP in the last 8760 hours.  Lipid Profile: No results for input(s): CHOL, HDL, LDLCALC, TRIG, CHOLHDL, LDLDIRECT in the last 72 hours.  Thyroid Function Tests: No results for input(s): TSH, T4TOTAL, FREET4, T3FREE, THYROIDAB in the last 72 hours.  Anemia Panel: Recent Labs    09/07/21 0700 09/08/21 0417  FERRITIN 88 130     Urine analysis:    Component Value Date/Time   BILIRUBINUR Small 07/15/2015 1103   PROTEINUR 100 07/15/2015 1103   UROBILINOGEN 0.2 07/15/2015 1103   NITRITE Negative 07/15/2015 1103   LEUKOCYTESUR Negative 07/15/2015 1103    Sepsis Labs: Lactic Acid, Venous    Component Value Date/Time   LATICACIDVEN 1.0 09/06/2021 2136    MICROBIOLOGY: Recent Results (from the past 240 hour(s))  Resp Panel by RT-PCR (Flu A&B, Covid) Nasopharyngeal Swab     Status: Abnormal   Collection Time: 09/06/21  9:16 PM   Specimen: Nasopharyngeal Swab; Nasopharyngeal(NP) swabs in vial transport medium  Result Value Ref Range Status   SARS Coronavirus 2 by RT PCR POSITIVE (A) NEGATIVE Final    Comment: RESULT CALLED TO, READ BACK BY AND VERIFIED WITH: RN Tarry M. 09/06/21@23 :03 BY  TW (NOTE) SARS-CoV-2 target nucleic acids are DETECTED.  The SARS-CoV-2 RNA is generally detectable in upper respiratory specimens during the acute phase of infection. Positive results are indicative of the presence of the identified virus, but do not rule out bacterial infection or co-infection with other pathogens not detected by the test. Clinical correlation with patient history and other diagnostic information is necessary to determine patient infection status. The expected result is Negative.  Fact Sheet for Patients: EntrepreneurPulse.com.au  Fact Sheet for Healthcare Providers: IncredibleEmployment.be  This test is not yet approved or cleared by the [place] FDA and  has been authorized for detection and/or diagnosis of SARS-CoV-2 by FDA under an Emergency Use Authorization (EUA).  This EUA will remain in effect (meaning this test can be u sed) for the duration of  the COVID-19 declaration under Section 564(b)(1) of the Act, 21 U.S.C. section 360bbb-3(b)(1), unless the authorization is terminated or revoked sooner.     Influenza A by PCR NEGATIVE NEGATIVE Final   Influenza B by PCR NEGATIVE NEGATIVE Final    Comment: (NOTE) The Xpert Xpress SARS-CoV-2/FLU/RSV plus assay is intended as an aid in the diagnosis of influenza from Nasopharyngeal swab specimens and should not be used as a sole basis for treatment. Nasal washings and aspirates are unacceptable for Xpert Xpress SARS-CoV-2/FLU/RSV testing.  Fact Sheet for Patients: EntrepreneurPulse.com.au  Fact Sheet for Healthcare Providers: IncredibleEmployment.be  This test is not yet approved or cleared by the [place] FDA and has been authorized for detection and/or diagnosis of SARS-CoV-2 by FDA under an Emergency Use Authorization (EUA). This EUA will remain in effect (meaning this test can be used) for the duration of the COVID-19  declaration under Section 564(b)(1) of the Act, 21 U.S.C. section 360bbb-3(b)(1), unless the authorization is terminated or revoked.  Performed at [facility], [place] [address], [place], [place] [address]     RADIOLOGY STUDIES/RESULTS: DG Chest 2 View  Result Date: 09/06/2021 CLINICAL DATA:  Cough, COVID positive EXAM: CHEST - 2 VIEW COMPARISON:  12/04/2019 FINDINGS: Lungs are clear.  No pleural effusion or pneumothorax. The heart is normal in size.  Thoracic aortic atherosclerosis. Degenerative changes  of the visualized thoracolumbar spine. IMPRESSION: Normal chest radiographs in this patient with known COVID. Electronically Signed   By: Julian Hy M.D.   On: 09/06/2021 22:13   DG Chest Port 1V same Day  Result Date: 09/08/2021 CLINICAL DATA:  Shortness of breath, COVID positive EXAM: PORTABLE CHEST 1 VIEW COMPARISON:  Previous studies including the examination 09/06/2021 FINDINGS: Transverse diameter of heart is increased. There are new patchy infiltrates in the left lower lung fields partly obscuring the left hemidiaphragm. Left lateral CP angle is indistinct. Rest of the lung fields are unremarkable. Azygous fissure is seen in right upper lung fields. There is no pneumothorax. IMPRESSION: New patchy infiltrates are seen in the left lower lung fields suggesting pneumonia. Small left pleural effusion. Electronically Signed   By: Elmer Picker M.D.   On: 09/08/2021 08:43   VAS [name] LOWER EXTREMITY VENOUS (DVT)  Result Date: 09/07/2021  Lower Venous DVT Study Patient Name:  [name]  Date of Exam:   09/07/2021 Medical Rec #: [id number]      Accession #:    [id number] Date of Birth: Sep 03, 1946     Patient Gender: M Patient Age:   47 years Exam Location:  [facility] Procedure:      VAS [name] LOWER EXTREMITY VENOUS (DVT) Referring Phys: Oren Binet --------------------------------------------------------------------------------  Indications: Swelling.  Risk Factors:  COVID 19 positive. Comparison Study: No prior studies. Performing Technologist: Oliver Hum RVT  Examination Guidelines: A complete evaluation includes B-mode imaging, spectral Doppler, color Doppler, and power Doppler as needed of all accessible portions of each vessel. Bilateral testing is considered an integral part of a complete examination. Limited examinations for reoccurring indications may be performed as noted. The reflux portion of the exam is performed with the patient in reverse Trendelenburg.  +---------+---------------+---------+-----------+----------+--------------+ RIGHT    CompressibilityPhasicitySpontaneityPropertiesThrombus Aging +---------+---------------+---------+-----------+----------+--------------+ CFV      Full           Yes      Yes                                 +---------+---------------+---------+-----------+----------+--------------+ SFJ      Full                                                        +---------+---------------+---------+-----------+----------+--------------+ FV Prox  Full                                                        +---------+---------------+---------+-----------+----------+--------------+ FV Mid   Full                                                        +---------+---------------+---------+-----------+----------+--------------+ FV DistalFull                                                        +---------+---------------+---------+-----------+----------+--------------+  PFV      Full                                                        +---------+---------------+---------+-----------+----------+--------------+ POP      Full           Yes      Yes                                 +---------+---------------+---------+-----------+----------+--------------+ PTV      Full                                                         +---------+---------------+---------+-----------+----------+--------------+ PERO     Full                                                        +---------+---------------+---------+-----------+----------+--------------+   +---------+---------------+---------+-----------+----------+--------------+ LEFT     CompressibilityPhasicitySpontaneityPropertiesThrombus Aging +---------+---------------+---------+-----------+----------+--------------+ CFV      Full           Yes      Yes                                 +---------+---------------+---------+-----------+----------+--------------+ SFJ      Full                                                        +---------+---------------+---------+-----------+----------+--------------+ FV Prox  Full                                                        +---------+---------------+---------+-----------+----------+--------------+ FV Mid   Full                                                        +---------+---------------+---------+-----------+----------+--------------+ FV DistalFull                                                        +---------+---------------+---------+-----------+----------+--------------+ PFV      Full                                                        +---------+---------------+---------+-----------+----------+--------------+  POP      Full           Yes      Yes                                 +---------+---------------+---------+-----------+----------+--------------+ PTV      Full                                                        +---------+---------------+---------+-----------+----------+--------------+ PERO     Full                                                        +---------+---------------+---------+-----------+----------+--------------+     Summary: RIGHT: - There is no evidence of deep vein thrombosis in the lower extremity.  - No cystic structure found in  the popliteal fossa.  LEFT: - There is no evidence of deep vein thrombosis in the lower extremity.  - No cystic structure found in the popliteal fossa.  *See table(s) above for measurements and observations. Electronically signed by Jamelle Haring on 09/07/2021 at 3:19:45 PM.    Final      LOS: 1 day   Oren Binet, MD  [facility]    To contact the attending provider between 7A-7P or the covering provider during after hours 7P-7A, please log into the web site [email or web address] and access using universal [place] password for that web site. If you do not have the password, please call the hospital operator.  09/08/2021, 3:59 PM

## 2021-09-09 ENCOUNTER — Inpatient Hospital Stay (HOSPITAL_COMMUNITY): Payer: Medicare Other

## 2021-09-09 LAB — CBC WITH DIFFERENTIAL/PLATELET
Abs Immature Granulocytes: 0.07 10*3/uL (ref 0.00–0.07)
Basophils Absolute: 0 10*3/uL (ref 0.0–0.1)
Basophils Relative: 0 %
Eosinophils Absolute: 0 10*3/uL (ref 0.0–0.5)
Eosinophils Relative: 0 %
HCT: 41.5 % (ref 39.0–52.0)
Hemoglobin: 13.7 g/dL (ref 13.0–17.0)
Immature Granulocytes: 1 %
Lymphocytes Relative: 10 %
Lymphs Abs: 1.5 10*3/uL (ref 0.7–4.0)
MCH: 30.8 pg (ref 26.0–34.0)
MCHC: 33 g/dL (ref 30.0–36.0)
MCV: 93.3 fL (ref 80.0–100.0)
Monocytes Absolute: 1 10*3/uL (ref 0.1–1.0)
Monocytes Relative: 7 %
Neutro Abs: 11.9 10*3/uL — ABNORMAL HIGH (ref 1.7–7.7)
Neutrophils Relative %: 82 %
Platelets: 192 10*3/uL (ref 150–400)
RBC: 4.45 MIL/uL (ref 4.22–5.81)
RDW: 14.2 % (ref 11.5–15.5)
WBC: 14.4 10*3/uL — ABNORMAL HIGH (ref 4.0–10.5)
nRBC: 0 % (ref 0.0–0.2)

## 2021-09-09 LAB — COMPREHENSIVE METABOLIC PANEL
ALT: 27 U/L (ref 0–44)
AST: 23 U/L (ref 15–41)
Albumin: 3.1 g/dL — ABNORMAL LOW (ref 3.5–5.0)
Alkaline Phosphatase: 71 U/L (ref 38–126)
Anion gap: 7 (ref 5–15)
BUN: 26 mg/dL — ABNORMAL HIGH (ref 8–23)
CO2: 29 mmol/L (ref 22–32)
Calcium: 8.9 mg/dL (ref 8.9–10.3)
Chloride: 102 mmol/L (ref 98–111)
Creatinine, Ser: 0.97 mg/dL (ref 0.61–1.24)
GFR, Estimated: >60 mL/min (ref >60)
Glucose, Bld: 225 mg/dL — ABNORMAL HIGH (ref 70–99)
Potassium: 4.7 mmol/L (ref 3.5–5.1)
Sodium: 138 mmol/L (ref 135–145)
Total Bilirubin: 0.9 mg/dL (ref 0.3–1.2)
Total Protein: 6.5 g/dL (ref 6.5–8.1)

## 2021-09-09 LAB — C-REACTIVE PROTEIN: CRP: 8.1 mg/dL — ABNORMAL HIGH (ref <1.0)

## 2021-09-09 LAB — GLUCOSE, CAPILLARY
Glucose-Capillary: 223 mg/dL — ABNORMAL HIGH (ref 70–99)
Glucose-Capillary: 198 mg/dL — ABNORMAL HIGH (ref 70–99)
Glucose-Capillary: 368 mg/dL — ABNORMAL HIGH (ref 70–99)
Glucose-Capillary: 352 mg/dL — ABNORMAL HIGH (ref 70–99)

## 2021-09-09 LAB — BRAIN NATRIURETIC PEPTIDE: B Natriuretic Peptide: 92.5 pg/mL (ref 0.0–100.0)

## 2021-09-09 LAB — PROCALCITONIN: Procalcitonin: 0.69 ng/mL

## 2021-09-09 LAB — D-DIMER, QUANTITATIVE: D-Dimer, Quant: 0.75 ug/mL-FEU — ABNORMAL HIGH (ref 0.00–0.50)

## 2021-09-09 MED ORDER — METHYLPREDNISOLONE SODIUM SUCC 125 MG IJ SOLR
120.0000 mg | Freq: Every day | INTRAMUSCULAR | Status: DC
Start: 2021-09-09 — End: 2021-09-09

## 2021-09-09 MED ORDER — CARVEDILOL 6.25 MG PO TABS
6.2500 mg | ORAL_TABLET | Freq: Two times a day (BID) | ORAL | Status: DC
  Administered 2021-09-09 – 2021-09-11 (×4): 6.25 mg via ORAL
  Filled 2021-09-09 (×4): qty 1

## 2021-09-09 MED ORDER — METHYLPREDNISOLONE SODIUM SUCC 125 MG IJ SOLR
60.0000 mg | Freq: Every day | INTRAMUSCULAR | Status: DC
  Administered 2021-09-09: 60 mg via INTRAVENOUS
  Filled 2021-09-09: qty 2

## 2021-09-09 NOTE — Plan of Care (Signed)
  Problem: Health Behavior/Discharge Planning: Goal: Ability to manage health-related needs will improve Outcome: Progressing   Problem: Clinical Measurements: Goal: Cardiovascular complication will be avoided Outcome: Progressing   Problem: Clinical Measurements: Goal: Respiratory complications will improve Outcome: Not Progressing

## 2021-09-09 NOTE — Progress Notes (Signed)
PROGRESS NOTE        PATIENT DETAILS Name: [name] Age: 75 y.o. Sex: male Date of Birth: December 11, 1945 Admit Date: 09/06/2021 Admitting Physician Eben Burow, MD VZC:HYIFO, Bonnita Levan, MD  Brief Narrative: Patient is a 75 y.o. male with history of COPD, DM-2, HTN, CAD, PAD who presented with fever, cough, shortness of breath-found to have hypoxia due to probable COVID-19 pneumonia.    Patient is unvaccinated-however he did have COVID-19 infection a few years back.  Subjective: Patient in bed, appears comfortable, denies any headache, no fever, no chest pain or pressure, no shortness of breath , no abdominal pain. No new focal weakness.   Objective: Vitals: Blood pressure (!) 126/58, pulse 68, temperature (!) 100.6 F (38.1 C), temperature source Oral, resp. rate 18, height 5\' 9"  (1.753 m), weight 105 kg, SpO2 96 %.   Exam:  Awake Alert, No new F.N deficits, Normal affect .AT,PERRAL Supple Neck, No JVD,   Symmetrical Chest wall movement, Good air movement bilaterally, CTAB RRR,No Gallops, Rubs or new Murmurs,  +ve B.Sounds, Abd Soft, No tenderness,   No Cyanosis, Clubbing or edema    Pertinent Tests -   11/2>>CXR: No obvious pneumonia 11/3>> lower extremity Doppler: No DVT 11/4>> CXR: Left-sided infiltrates. 11/4>> Leg [name] - no DVT 11/4 >> Echo: 1. Left ventricular ejection fraction, by estimation, is 55 to 60%. The left ventricle has normal function. The left ventricle has no regional wall motion abnormalities. Left ventricular diastolic parameters were normal.  2. Right ventricular systolic function is normal. The right ventricular size is mildly enlarged. Tricuspid regurgitation signal is inadequate for assessing PA pressure.  3. The mitral valve is grossly normal. No evidence of mitral valve regurgitation. No evidence of mitral stenosis.  4. The aortic valve is tricuspid. There is mild calcification of the aortic valve. Aortic valve  regurgitation is not visualized. No aortic stenosis is present.  5. The inferior vena cava is dilated in size with >50% respiratory variability, suggesting right atrial pressure of 8 mmHg  Assessment/Plan: Acute hypoxic respiratory failure due to COVID-19 pneumonia: Clinically improved but still remains on 2 L of oxygen.  CRP significantly elevated today CXR on 11/4 now shows mostly left-sided infiltrates.  I still suspect this is COVID-19 pneumonitis-but given mostly left-sided infiltrates and minimally elevated procalcitonin levels- continue on Rocephin/Doxy x5 days and continuing Remdesivir and steroids .  Have asked nursing staff to see if we can titrate down FiO2 as tolerated, add IS + FV.  Elevated D-dimer: Due to COVID-19 infection-Dopplers negative for DVT.  On prophylactic Lovenox & trending down.  DM-2 with uncontrolled hyperglycemia due to steroids: Change SSI to resistant scale and follow CBGs.  If still persistently--May need Levemir/Semglee while on steroids.  Recent Labs    09/08/21 1702 09/09/21 0655 09/09/21 1110  GLUCAP 386* 223* 198*    HTN: BP low, IVF, BP meds changed - monitor.  COPD: Not in exacerbation-continue bronchodilators  Hypothyroidism: Continue Synthroid  HLD: Continue statin  History of PAD: Stable-continue dual antiplatelet agents/statin.  Prolonged QTC: Improved-hold Zoloft for a few more days-K/Mg stable.    Obesity Estimated body mass index is 34.17 kg/m as calculated from the following:   Height as of this encounter: 5\' 9"  (1.753 m).   Weight as of this encounter: 105 kg.   SpO2: 96 % O2 Flow Rate (  L/min): 2 L/min  Procedures: None Consults: None DVT Prophylaxis: Lovenox Code Status:Full code  Family Communication: Spouse at bedside  Time spent: 52 minutes-Greater than 50% of this time was spent in counseling, explanation of diagnosis, planning of further management, and coordination of care.   Disposition Plan: Status is:  Inpatient  Remains inpatient appropriate because: Hypoxia-COVID-19 pneumonia-needs IV antimicrobial/IV steroids.   Diet: Diet Order             Diet heart healthy/carb modified Room service appropriate? Yes; Fluid consistency: Thin  Diet effective now                     Antimicrobial agents: Anti-infectives (From admission, onward)    Start     Dose/Rate Route Frequency Ordered Stop   09/08/21 2200  doxycycline (VIBRA-TABS) tablet 100 mg        100 mg Oral Every 12 hours 09/08/21 1608 09/13/21 2159   09/08/21 1700  cefTRIAXone (ROCEPHIN) 2 g in sodium chloride 0.9 % 100 mL IVPB        2 g 200 mL/hr over 30 Minutes Intravenous Every 24 hours 09/08/21 1608 09/13/21 1659   09/08/21 1000  remdesivir 100 mg in sodium chloride 0.9 % 100 mL IVPB  Status:  Discontinued       See Hyperspace for full Linked Orders Report.   100 mg 200 mL/hr over 30 Minutes Intravenous Daily 09/07/21 0658 09/07/21 0659   09/08/21 1000  remdesivir 100 mg in sodium chloride 0.9 % 100 mL IVPB       See Hyperspace for full Linked Orders Report.   100 mg 200 mL/hr over 30 Minutes Intravenous Daily 09/07/21 0128 09/12/21 0959   09/07/21 0658  remdesivir 200 mg in sodium chloride 0.9% 250 mL IVPB  Status:  Discontinued       See Hyperspace for full Linked Orders Report.   200 mg 580 mL/hr over 30 Minutes Intravenous Once 09/07/21 0658 09/07/21 0659   09/07/21 0300  remdesivir 200 mg in sodium chloride 0.9% 250 mL IVPB       See Hyperspace for full Linked Orders Report.   200 mg 580 mL/hr over 30 Minutes Intravenous Once 09/07/21 0128 09/07/21 0415        MEDICATIONS: Scheduled Meds:  aspirin EC  81 mg Oral QHS   atorvastatin  80 mg Oral Daily   carvedilol  6.25 mg Oral BID WC   clopidogrel  75 mg Oral Q breakfast   doxycycline  100 mg Oral Q12H   enoxaparin (LOVENOX) injection  40 mg Subcutaneous Daily   insulin aspart  0-20 Units Subcutaneous TID WC   latanoprost  1 drop Both Eyes BID    levothyroxine  25 mcg Oral Daily   methylPREDNISolone (SOLU-MEDROL) injection  120 mg Intravenous Q1200   umeclidinium bromide  1 puff Inhalation Daily   Continuous Infusions:  cefTRIAXone (ROCEPHIN)  IV Stopped (09/08/21 1813)   remdesivir 100 mg in NS 100 mL 100 mg (09/09/21 0857)   PRN Meds:.acetaminophen, albuterol, ALPRAZolam, benzonatate, oxyCODONE, senna-docusate   I have personally reviewed following labs and imaging studies  LABORATORY DATA:  Recent Labs  Lab 09/06/21 2136 09/08/21 0417 09/09/21 0552  WBC 10.6* 14.2* 14.4*  HGB 14.9 14.7 13.7  HCT 44.9 43.6 41.5  PLT 201 178 192  MCV 93.2 92.0 93.3  MCH 30.9 31.0 30.8  MCHC 33.2 33.7 33.0  RDW 14.0 14.2 14.2  LYMPHSABS 1.5 1.7 1.5  MONOABS 1.1* 1.1* 1.0  EOSABS 0.1 0.0 0.0  BASOSABS 0.1 0.0 0.0    Recent Labs  Lab 09/06/21 2136 09/07/21 0659 09/07/21 0700 09/08/21 0417 09/09/21 0552  NA 137  --  135 136 138  K 3.7  --  4.1 4.1 4.7  CL 102  --  100 100 102  CO2 26  --  25 28 29   GLUCOSE 81  --  249* 174* 225*  BUN 19  --  17 25* 26*  CREATININE 0.82  --  0.92 0.91 0.97  CALCIUM 8.9  --  8.7* 8.8* 8.9  AST 38  --  41 26 23  ALT 32  --  33 29 27  ALKPHOS 89  --  79 74 71  BILITOT 0.6  --  0.8 1.0 0.9  ALBUMIN 4.1  --  3.7 3.3* 3.1*  MG  --   --  1.8 2.3  --   CRP  --   --  7.6* 16.3* 8.1*  DDIMER  --   --  1.19* 1.11* 0.75*  PROCALCITON  --   --  0.38 1.16 0.69  LATICACIDVEN 1.0  --   --   --   --   HGBA1C  --  7.1*  --   --   --   BNP  --   --   --  70.7 92.5          RADIOLOGY STUDIES/RESULTS: DG Chest Port 1 View  Result Date: 09/09/2021 CLINICAL DATA:  75 year old male with cough shortness of breath EXAM: PORTABLE CHEST 1 VIEW COMPARISON:  09/08/2021 FINDINGS: Cardiomediastinal silhouette unchanged in size and contour. Calcifications of the aortic arch. No pneumothorax. Mixed reticulonodular opacities at the left lung base, appear improved from the prior. No new right-sided airspace  disease. No large pleural effusion. IMPRESSION: Persisting small volume interstitial/airspace disease at the left lung base Electronically Signed   By: Corrie Mckusick D.O.   On: 09/09/2021 10:50   DG Chest Port 1V same Day  Result Date: 09/08/2021 CLINICAL DATA:  Shortness of breath, COVID positive EXAM: PORTABLE CHEST 1 VIEW COMPARISON:  Previous studies including the examination 09/06/2021 FINDINGS: Transverse diameter of heart is increased. There are new patchy infiltrates in the left lower lung fields partly obscuring the left hemidiaphragm. Left lateral CP angle is indistinct. Rest of the lung fields are unremarkable. Azygous fissure is seen in right upper lung fields. There is no pneumothorax. IMPRESSION: New patchy infiltrates are seen in the left lower lung fields suggesting pneumonia. Small left pleural effusion. Electronically Signed   By: Elmer Picker M.D.   On: 09/08/2021 08:43   ECHOCARDIOGRAM COMPLETE  Result Date: 09/08/2021    ECHOCARDIOGRAM REPORT   Patient Name:   [name] Marchant Date of Exam: 09/08/2021 Medical Rec #:  [id number]     Height:       69.0 in Accession #:    [id number]    Weight:       231.4 lb Date of Birth:  [birth year]    BSA:          2.198 m Patient Age:    82 years      BP:           111/64 mmHg Patient Gender: M             HR:           70 bpm. Exam Location:  Inpatient Procedure: 2D Echo, Cardiac Doppler, Color Doppler and Intracardiac  Opacification Agent Indications:    Respiratory distress  History:        Patient has prior history of Echocardiogram examinations. COPD;                 Risk Factors:Diabetes and Hypertension.  Sonographer:    Jyl [name] Referring Phys: [place]  1. Left ventricular ejection fraction, by estimation, is 55 to 60%. The left ventricle has normal function. The left ventricle has no regional wall motion abnormalities. Left ventricular diastolic parameters were normal.  2. Right ventricular systolic  function is normal. The right ventricular size is mildly enlarged. Tricuspid regurgitation signal is inadequate for assessing PA pressure.  3. The mitral valve is grossly normal. No evidence of mitral valve regurgitation. No evidence of mitral stenosis.  4. The aortic valve is tricuspid. There is mild calcification of the aortic valve. Aortic valve regurgitation is not visualized. No aortic stenosis is present.  5. The inferior vena cava is dilated in size with >50% respiratory variability, suggesting right atrial pressure of 8 mmHg. FINDINGS  Left Ventricle: Left ventricular ejection fraction, by estimation, is 55 to 60%. The left ventricle has normal function. The left ventricle has no regional wall motion abnormalities. Definity contrast agent was given IV to delineate the left ventricular  endocardial borders. The left ventricular internal cavity size was normal in size. There is no left ventricular hypertrophy. Left ventricular diastolic parameters were normal. Right Ventricle: The right ventricular size is mildly enlarged. No increase in right ventricular wall thickness. Right ventricular systolic function is normal. Tricuspid regurgitation signal is inadequate for assessing PA pressure. Left Atrium: Left atrial size was normal in size. Right Atrium: Right atrial size was normal in size. Pericardium: Trivial pericardial effusion is present. Presence of pericardial fat pad. Mitral Valve: The mitral valve is grossly normal. No evidence of mitral valve regurgitation. No evidence of mitral valve stenosis. Tricuspid Valve: The tricuspid valve is grossly normal. Tricuspid valve regurgitation is not demonstrated. No evidence of tricuspid stenosis. Aortic Valve: The aortic valve is tricuspid. There is mild calcification of the aortic valve. Aortic valve regurgitation is not visualized. No aortic stenosis is present. Aortic valve peak gradient measures 5.7 mmHg. Pulmonic Valve: The pulmonic valve was grossly normal.  Pulmonic valve regurgitation is not visualized. No evidence of pulmonic stenosis. Aorta: The aortic root and ascending aorta are structurally normal, with no evidence of dilitation. Venous: The inferior vena cava is dilated in size with greater than 50% respiratory variability, suggesting right atrial pressure of 8 mmHg. IAS/Shunts: The atrial septum is grossly normal.  LEFT VENTRICLE PLAX 2D LVIDd:         5.60 cm      Diastology LVIDs:         4.10 cm      LV e' medial:    6.09 cm/s LV PW:         1.00 cm      LV E/e' medial:  12.8 LV IVS:        0.90 cm      LV e' lateral:   7.51 cm/s LVOT diam:     2.00 cm      LV E/e' lateral: 10.4 LV SV:         60 LV SV Index:   27 LVOT Area:     3.14 cm  LV Volumes (MOD) LV vol d, MOD A2C: 133.0 ml LV vol d, MOD A4C: 147.0 ml LV vol s, MOD A2C:  54.0 ml LV vol s, MOD A4C: 71.0 ml LV SV MOD A2C:     79.0 ml LV SV MOD A4C:     147.0 ml LV SV MOD BP:      76.4 ml RIGHT VENTRICLE             IVC RV Basal diam:  5.10 cm     IVC diam: 2.10 cm RV Mid diam:    4.40 cm RV S prime:     13.30 cm/s TAPSE (M-mode): 2.8 cm LEFT ATRIUM             Index        RIGHT ATRIUM           Index LA diam:        3.30 cm 1.50 cm/m   RA Area:     22.60 cm LA Vol (A2C):   73.8 ml 33.58 ml/m  RA Volume:   71.10 ml  32.35 ml/m LA Vol (A4C):   48.1 ml 21.88 ml/m LA Biplane Vol: 63.0 ml 28.66 ml/m  AORTIC VALVE AV Area (Vmax): 2.10 cm AV Vmax:        119.00 cm/s AV Peak Grad:   5.7 mmHg LVOT Vmax:      79.60 cm/s LVOT Vmean:     58.700 cm/s LVOT VTI:       0.191 m  AORTA Ao Root diam: 3.70 cm Ao Asc diam:  3.50 cm MITRAL VALVE MV Area (PHT): 3.51 cm    SHUNTS MV Decel Time: 216 msec    Systemic VTI:  0.19 m MV E velocity: 78.00 cm/s  Systemic Diam: 2.00 cm MV A velocity: 78.80 cm/s MV E/A ratio:  0.99 Eleonore Chiquito MD Electronically signed by Eleonore Chiquito MD Signature Date/Time: 09/08/2021/4:58:53 PM    Final    VAS [name] LOWER EXTREMITY VENOUS (DVT)  Result Date: 09/07/2021  Lower Venous DVT  Study Patient Name:  [name]  Date of Exam:   09/07/2021 Medical Rec #: [id number]      Accession #:    [id number] Date of Birth: 1946-09-30     Patient Gender: M Patient Age:   80 years Exam Location:  [facility] Procedure:      VAS [name] LOWER EXTREMITY VENOUS (DVT) Referring Phys: Oren Binet --------------------------------------------------------------------------------  Indications: Swelling.  Risk Factors: COVID 19 positive. Comparison Study: No prior studies. Performing Technologist: Oliver Hum RVT  Examination Guidelines: A complete evaluation includes B-mode imaging, spectral Doppler, color Doppler, and power Doppler as needed of all accessible portions of each vessel. Bilateral testing is considered an integral part of a complete examination. Limited examinations for reoccurring indications may be performed as noted. The reflux portion of the exam is performed with the patient in reverse Trendelenburg.  +---------+---------------+---------+-----------+----------+--------------+ RIGHT    CompressibilityPhasicitySpontaneityPropertiesThrombus Aging +---------+---------------+---------+-----------+----------+--------------+ CFV      Full           Yes      Yes                                 +---------+---------------+---------+-----------+----------+--------------+ SFJ      Full                                                        +---------+---------------+---------+-----------+----------+--------------+  FV Prox  Full                                                        +---------+---------------+---------+-----------+----------+--------------+ FV Mid   Full                                                        +---------+---------------+---------+-----------+----------+--------------+ FV DistalFull                                                        +---------+---------------+---------+-----------+----------+--------------+ PFV       Full                                                        +---------+---------------+---------+-----------+----------+--------------+ POP      Full           Yes      Yes                                 +---------+---------------+---------+-----------+----------+--------------+ PTV      Full                                                        +---------+---------------+---------+-----------+----------+--------------+ PERO     Full                                                        +---------+---------------+---------+-----------+----------+--------------+   +---------+---------------+---------+-----------+----------+--------------+ LEFT     CompressibilityPhasicitySpontaneityPropertiesThrombus Aging +---------+---------------+---------+-----------+----------+--------------+ CFV      Full           Yes      Yes                                 +---------+---------------+---------+-----------+----------+--------------+ SFJ      Full                                                        +---------+---------------+---------+-----------+----------+--------------+ FV Prox  Full                                                        +---------+---------------+---------+-----------+----------+--------------+  FV Mid   Full                                                        +---------+---------------+---------+-----------+----------+--------------+ FV DistalFull                                                        +---------+---------------+---------+-----------+----------+--------------+ PFV      Full                                                        +---------+---------------+---------+-----------+----------+--------------+ POP      Full           Yes      Yes                                 +---------+---------------+---------+-----------+----------+--------------+ PTV      Full                                                         +---------+---------------+---------+-----------+----------+--------------+ PERO     Full                                                        +---------+---------------+---------+-----------+----------+--------------+     Summary: RIGHT: - There is no evidence of deep vein thrombosis in the lower extremity.  - No cystic structure found in the popliteal fossa.  LEFT: - There is no evidence of deep vein thrombosis in the lower extremity.  - No cystic structure found in the popliteal fossa.  *See table(s) above for measurements and observations. Electronically signed by Jamelle Haring on 09/07/2021 at 3:19:45 PM.    Final      LOS: 2 days   Signature  Lala Lund M.D on 09/09/2021 at 1:41 PM   -  To page go to [email or web address]

## 2021-09-10 LAB — CBC WITH DIFFERENTIAL/PLATELET
Abs Immature Granulocytes: 0.1 10*3/uL — ABNORMAL HIGH (ref 0.00–0.07)
Basophils Absolute: 0 10*3/uL (ref 0.0–0.1)
Basophils Relative: 0 %
Eosinophils Absolute: 0 10*3/uL (ref 0.0–0.5)
Eosinophils Relative: 0 %
HCT: 44.8 % (ref 39.0–52.0)
Hemoglobin: 14.5 g/dL (ref 13.0–17.0)
Immature Granulocytes: 1 %
Lymphocytes Relative: 10 %
Lymphs Abs: 1.1 10*3/uL (ref 0.7–4.0)
MCH: 30.3 pg (ref 26.0–34.0)
MCHC: 32.4 g/dL (ref 30.0–36.0)
MCV: 93.5 fL (ref 80.0–100.0)
Monocytes Absolute: 0.6 10*3/uL (ref 0.1–1.0)
Monocytes Relative: 6 %
Neutro Abs: 9.3 10*3/uL — ABNORMAL HIGH (ref 1.7–7.7)
Neutrophils Relative %: 83 %
Platelets: 207 10*3/uL (ref 150–400)
RBC: 4.79 MIL/uL (ref 4.22–5.81)
RDW: 13.6 % (ref 11.5–15.5)
WBC: 11.1 10*3/uL — ABNORMAL HIGH (ref 4.0–10.5)
nRBC: 0 % (ref 0.0–0.2)

## 2021-09-10 LAB — COMPREHENSIVE METABOLIC PANEL
ALT: 29 U/L (ref 0–44)
AST: 22 U/L (ref 15–41)
Albumin: 3.2 g/dL — ABNORMAL LOW (ref 3.5–5.0)
Alkaline Phosphatase: 74 U/L (ref 38–126)
Anion gap: 10 (ref 5–15)
BUN: 26 mg/dL — ABNORMAL HIGH (ref 8–23)
CO2: 25 mmol/L (ref 22–32)
Calcium: 9 mg/dL (ref 8.9–10.3)
Chloride: 99 mmol/L (ref 98–111)
Creatinine, Ser: 0.96 mg/dL (ref 0.61–1.24)
GFR, Estimated: >60 mL/min (ref >60)
Glucose, Bld: 395 mg/dL — ABNORMAL HIGH (ref 70–99)
Potassium: 4.4 mmol/L (ref 3.5–5.1)
Sodium: 134 mmol/L — ABNORMAL LOW (ref 135–145)
Total Bilirubin: 0.9 mg/dL (ref 0.3–1.2)
Total Protein: 6.6 g/dL (ref 6.5–8.1)

## 2021-09-10 LAB — PROCALCITONIN: Procalcitonin: 0.27 ng/mL

## 2021-09-10 LAB — GLUCOSE, CAPILLARY
Glucose-Capillary: 293 mg/dL — ABNORMAL HIGH (ref 70–99)
Glucose-Capillary: 309 mg/dL — ABNORMAL HIGH (ref 70–99)
Glucose-Capillary: 331 mg/dL — ABNORMAL HIGH (ref 70–99)
Glucose-Capillary: 366 mg/dL — ABNORMAL HIGH (ref 70–99)

## 2021-09-10 LAB — C-REACTIVE PROTEIN: CRP: 2.5 mg/dL — ABNORMAL HIGH (ref <1.0)

## 2021-09-10 LAB — D-DIMER, QUANTITATIVE: D-Dimer, Quant: 0.63 ug/mL-FEU — ABNORMAL HIGH (ref 0.00–0.50)

## 2021-09-10 LAB — BRAIN NATRIURETIC PEPTIDE: B Natriuretic Peptide: 139.5 pg/mL — ABNORMAL HIGH (ref 0.0–100.0)

## 2021-09-10 MED ORDER — INSULIN ASPART 100 UNIT/ML IJ SOLN
0.0000 [IU] | Freq: Every day | INTRAMUSCULAR | Status: DC
  Administered 2021-09-10: 5 [IU] via SUBCUTANEOUS

## 2021-09-10 MED ORDER — INSULIN ASPART 100 UNIT/ML IJ SOLN
2.0000 [IU] | Freq: Three times a day (TID) | INTRAMUSCULAR | Status: DC
  Administered 2021-09-10 – 2021-09-11 (×4): 2 [IU] via SUBCUTANEOUS

## 2021-09-10 MED ORDER — INSULIN GLARGINE-YFGN 100 UNIT/ML ~~LOC~~ SOLN
10.0000 [IU] | Freq: Every day | SUBCUTANEOUS | Status: DC
  Administered 2021-09-10: 10 [IU] via SUBCUTANEOUS
  Filled 2021-09-10 (×2): qty 0.1

## 2021-09-10 MED ORDER — INSULIN ASPART 100 UNIT/ML IJ SOLN
0.0000 [IU] | Freq: Three times a day (TID) | INTRAMUSCULAR | Status: DC
  Administered 2021-09-10 – 2021-09-11 (×3): 11 [IU] via SUBCUTANEOUS
  Administered 2021-09-11: 8 [IU] via SUBCUTANEOUS

## 2021-09-10 MED ORDER — METHYLPREDNISOLONE SODIUM SUCC 40 MG IJ SOLR
40.0000 mg | Freq: Every day | INTRAMUSCULAR | Status: DC
Start: 2021-09-10 — End: 2021-09-11
  Administered 2021-09-10: 40 mg via INTRAVENOUS
  Filled 2021-09-10: qty 1

## 2021-09-10 MED ORDER — METHYLPREDNISOLONE SODIUM SUCC 40 MG IJ SOLR
40.0000 mg | Freq: Two times a day (BID) | INTRAMUSCULAR | Status: DC
  Administered 2021-09-10: 40 mg via INTRAVENOUS
  Filled 2021-09-10: qty 1

## 2021-09-10 NOTE — Progress Notes (Signed)
PROGRESS NOTE        PATIENT DETAILS Name: [name] Age: 75 y.o. Sex: male Date of Birth: [birth year] Admit Date: 09/06/2021 Admitting Physician Eben Burow, MD LID:[id number], Bonnita Levan, MD  Brief Narrative: Patient is a 75 y.o. male with history of COPD, DM-2, HTN, CAD, PAD who presented with fever, cough, shortness of breath-found to have hypoxia due to probable COVID-19 pneumonia.    Patient is unvaccinated-however he did have COVID-19 infection a few years back.  Subjective: Patient in bed, appears comfortable, denies any headache, no fever, no chest pain or pressure, improved shortness of breath , no abdominal pain. No new focal weakness.   Objective: Vitals: Blood pressure 119/62, pulse 72, temperature 98 F (36.7 C), resp. rate 19, height 5\' 9"  (1.753 m), weight 105 kg, SpO2 91 %.   Exam:  Awake Alert, No new F.N deficits, Normal affect [place].AT,PERRAL Supple Neck, No JVD,   Symmetrical Chest wall movement, Good air movement bilaterally, CTAB RRR,No Gallops, Rubs or new Murmurs,  +ve B.Sounds, Abd Soft, No tenderness,   No Cyanosis, Clubbing or edema     Pertinent Tests -   11/2>>CXR: No obvious pneumonia 11/3>> lower extremity Doppler: No DVT 11/4>> CXR: Left-sided infiltrates. 11/4>> Leg [name] - no DVT 11/4 >> Echo: 1. Left ventricular ejection fraction, by estimation, is 55 to 60%. The left ventricle has normal function. The left ventricle has no regional wall motion abnormalities. Left ventricular diastolic parameters were normal.  2. Right ventricular systolic function is normal. The right ventricular size is mildly enlarged. Tricuspid regurgitation signal is inadequate for assessing PA pressure.  3. The mitral valve is grossly normal. No evidence of mitral valve regurgitation. No evidence of mitral stenosis.  4. The aortic valve is tricuspid. There is mild calcification of the aortic valve. Aortic valve regurgitation is not visualized.  No aortic stenosis is present.  5. The inferior vena cava is dilated in size with >50% respiratory variability, suggesting right atrial pressure of 8 mmHg  Assessment/Plan:  Acute hypoxic respiratory failure due to COVID-19 pneumonia: Clinically improved but still remains on 2 L of oxygen.  CRP significantly elevated today CXR on 11/4 now shows mostly left-sided infiltrates.  I still suspect this is COVID-19 pneumonitis-but given mostly left-sided infiltrates and minimally elevated procalcitonin levels- continue on Rocephin/Doxy x5 days and continuing Remdesivir and steroids .  Have asked nursing staff to see if we can titrate down FiO2 as tolerated, add IS + FV.  Elevated D-dimer: Due to COVID-19 infection-Dopplers negative for DVT.  On prophylactic Lovenox & trending down.  HTN: BP low, post IVF, BP meds changed -blood pressure is improving continue to monitor closely.  COPD: Not in exacerbation-continue bronchodilators  Hypothyroidism: Continue Synthroid  HLD: Continue statin  History of PAD: Stable-continue dual antiplatelet agents/statin.  Prolonged QTC: Improved-hold Zoloft for a few more days-K/Mg stable.    Obesity BMI of 34 follow with PCP.   DM-2 with uncontrolled hyperglycemia due to steroids: Added on Lantus sliding scale and Premeal NovoLog due to steroid use, upon discharge likely will place on Glucophage with outpatient PCP monitoring, diabetic and insulin education    Lab Results  Component Value Date   HGBA1C 7.1 (H) 09/07/2021    CBG (last 3)  Recent Labs    09/09/21 1705 09/09/21 2103 09/10/21 0648  GLUCAP 368* 352* 293*  Procedures: None Consults: None DVT Prophylaxis: Lovenox Code Status:Full code  Family Communication: Spouse at bedside 09/09/21  Time spent: 35 minutes-Greater than 50% of this time was spent in counseling, explanation of diagnosis, planning of further management, and coordination of care.   Disposition Plan: Status is:  Inpatient  Remains inpatient appropriate because: Hypoxia-COVID-19 pneumonia-needs IV antimicrobial/IV steroids.   Diet: Diet Order             Diet heart healthy/carb modified Room service appropriate? Yes; Fluid consistency: Thin  Diet effective now                     Antimicrobial agents: Anti-infectives (From admission, onward)    Start     Dose/Rate Route Frequency Ordered Stop   09/08/21 2200  doxycycline (VIBRA-TABS) tablet 100 mg        100 mg Oral Every 12 hours 09/08/21 1608 09/13/21 2159   09/08/21 1700  cefTRIAXone (ROCEPHIN) 2 g in sodium chloride 0.9 % 100 mL IVPB        2 g 200 mL/hr over 30 Minutes Intravenous Every 24 hours 09/08/21 1608 09/13/21 1659   09/08/21 1000  remdesivir 100 mg in sodium chloride 0.9 % 100 mL IVPB  Status:  Discontinued       See Hyperspace for full Linked Orders Report.   100 mg 200 mL/hr over 30 Minutes Intravenous Daily 09/07/21 0658 09/07/21 0659   09/08/21 1000  remdesivir 100 mg in sodium chloride 0.9 % 100 mL IVPB       See Hyperspace for full Linked Orders Report.   100 mg 200 mL/hr over 30 Minutes Intravenous Daily 09/07/21 0128 09/12/21 0959   09/07/21 0658  remdesivir 200 mg in sodium chloride 0.9% 250 mL IVPB  Status:  Discontinued       See Hyperspace for full Linked Orders Report.   200 mg 580 mL/hr over 30 Minutes Intravenous Once 09/07/21 0658 09/07/21 0659   09/07/21 0300  remdesivir 200 mg in sodium chloride 0.9% 250 mL IVPB       See Hyperspace for full Linked Orders Report.   200 mg 580 mL/hr over 30 Minutes Intravenous Once 09/07/21 0128 09/07/21 0415        MEDICATIONS: Scheduled Meds:  aspirin EC  81 mg Oral QHS   atorvastatin  80 mg Oral Daily   carvedilol  6.25 mg Oral BID WC   clopidogrel  75 mg Oral Q breakfast   doxycycline  100 mg Oral Q12H   enoxaparin (LOVENOX) injection  40 mg Subcutaneous Daily   insulin aspart  0-15 Units Subcutaneous TID WC   insulin aspart  0-5 Units Subcutaneous  QHS   insulin aspart  2 Units Subcutaneous TID WC   insulin glargine-yfgn  10 Units Subcutaneous Daily   latanoprost  1 drop Both Eyes BID   levothyroxine  25 mcg Oral Daily   methylPREDNISolone (SOLU-MEDROL) injection  40 mg Intravenous Q12H   umeclidinium bromide  1 puff Inhalation Daily   Continuous Infusions:  cefTRIAXone (ROCEPHIN)  IV 2 g (09/09/21 1719)   remdesivir 100 mg in NS 100 mL 100 mg (09/09/21 0857)   PRN Meds:.acetaminophen, albuterol, ALPRAZolam, benzonatate, oxyCODONE, senna-docusate   I have personally reviewed following labs and imaging studies  LABORATORY DATA:  Recent Labs  Lab 09/06/21 2136 09/08/21 0417 09/09/21 0552 09/10/21 0924  WBC 10.6* 14.2* 14.4* 11.1*  HGB 14.9 14.7 13.7 14.5  HCT 44.9 43.6 41.5 44.8  PLT 201 178  192 207  MCV 93.2 92.0 93.3 93.5  MCH 30.9 31.0 30.8 30.3  MCHC 33.2 33.7 33.0 32.4  RDW 14.0 14.2 14.2 13.6  LYMPHSABS 1.5 1.7 1.5 1.1  MONOABS 1.1* 1.1* 1.0 0.6  EOSABS 0.1 0.0 0.0 0.0  BASOSABS 0.1 0.0 0.0 0.0    Recent Labs  Lab 09/06/21 2136 09/07/21 0659 09/07/21 0700 09/08/21 0417 09/09/21 0552 09/10/21 0924  NA 137  --  135 136 138 134*  K 3.7  --  4.1 4.1 4.7 4.4  CL 102  --  100 100 102 99  CO2 26  --  25 28 29 25   GLUCOSE 81  --  249* 174* 225* 395*  BUN 19  --  17 25* 26* 26*  CREATININE 0.82  --  0.92 0.91 0.97 0.96  CALCIUM 8.9  --  8.7* 8.8* 8.9 9.0  AST 38  --  41 26 23 22   ALT 32  --  33 29 27 29   ALKPHOS 89  --  79 74 71 74  BILITOT 0.6  --  0.8 1.0 0.9 0.9  ALBUMIN 4.1  --  3.7 3.3* 3.1* 3.2*  MG  --   --  1.8 2.3  --   --   CRP  --   --  7.6* 16.3* 8.1*  --   DDIMER  --   --  1.19* 1.11* 0.75* 0.63*  PROCALCITON  --   --  0.38 1.16 0.69  --   LATICACIDVEN 1.0  --   --   --   --   --   HGBA1C  --  7.1*  --   --   --   --   BNP  --   --   --  70.7 92.5  --           RADIOLOGY STUDIES/RESULTS: DG Chest Port 1 View  Result Date: 09/09/2021 CLINICAL DATA:  75 year old male with cough  shortness of breath EXAM: PORTABLE CHEST 1 VIEW COMPARISON:  09/08/2021 FINDINGS: Cardiomediastinal silhouette unchanged in size and contour. Calcifications of the aortic arch. No pneumothorax. Mixed reticulonodular opacities at the left lung base, appear improved from the prior. No new right-sided airspace disease. No large pleural effusion. IMPRESSION: Persisting small volume interstitial/airspace disease at the left lung base Electronically Signed   By: Corrie Mckusick D.O.   On: 09/09/2021 10:50   ECHOCARDIOGRAM COMPLETE  Result Date: 09/08/2021    ECHOCARDIOGRAM REPORT   Patient Name:   JAELIN FACKLER Yambao Date of Exam: 09/08/2021 Medical Rec #:  [id number]     Height:       69.0 in Accession #:    [id number]    Weight:       231.4 lb Date of Birth:  February 03, 1946    BSA:          2.198 m Patient Age:    52 years      BP:           111/64 mmHg Patient Gender: M             HR:           70 bpm. Exam Location:  Inpatient Procedure: 2D Echo, Cardiac Doppler, Color Doppler and Intracardiac            Opacification Agent Indications:    Respiratory distress  History:        Patient has prior history of Echocardiogram examinations. COPD;  Risk Factors:Diabetes and Hypertension.  Sonographer:    Jyl [name] Referring Phys: [place]  1. Left ventricular ejection fraction, by estimation, is 55 to 60%. The left ventricle has normal function. The left ventricle has no regional wall motion abnormalities. Left ventricular diastolic parameters were normal.  2. Right ventricular systolic function is normal. The right ventricular size is mildly enlarged. Tricuspid regurgitation signal is inadequate for assessing PA pressure.  3. The mitral valve is grossly normal. No evidence of mitral valve regurgitation. No evidence of mitral stenosis.  4. The aortic valve is tricuspid. There is mild calcification of the aortic valve. Aortic valve regurgitation is not visualized. No aortic stenosis is  present.  5. The inferior vena cava is dilated in size with >50% respiratory variability, suggesting right atrial pressure of 8 mmHg. FINDINGS  Left Ventricle: Left ventricular ejection fraction, by estimation, is 55 to 60%. The left ventricle has normal function. The left ventricle has no regional wall motion abnormalities. Definity contrast agent was given IV to delineate the left ventricular  endocardial borders. The left ventricular internal cavity size was normal in size. There is no left ventricular hypertrophy. Left ventricular diastolic parameters were normal. Right Ventricle: The right ventricular size is mildly enlarged. No increase in right ventricular wall thickness. Right ventricular systolic function is normal. Tricuspid regurgitation signal is inadequate for assessing PA pressure. Left Atrium: Left atrial size was normal in size. Right Atrium: Right atrial size was normal in size. Pericardium: Trivial pericardial effusion is present. Presence of pericardial fat pad. Mitral Valve: The mitral valve is grossly normal. No evidence of mitral valve regurgitation. No evidence of mitral valve stenosis. Tricuspid Valve: The tricuspid valve is grossly normal. Tricuspid valve regurgitation is not demonstrated. No evidence of tricuspid stenosis. Aortic Valve: The aortic valve is tricuspid. There is mild calcification of the aortic valve. Aortic valve regurgitation is not visualized. No aortic stenosis is present. Aortic valve peak gradient measures 5.7 mmHg. Pulmonic Valve: The pulmonic valve was grossly normal. Pulmonic valve regurgitation is not visualized. No evidence of pulmonic stenosis. Aorta: The aortic root and ascending aorta are structurally normal, with no evidence of dilitation. Venous: The inferior vena cava is dilated in size with greater than 50% respiratory variability, suggesting right atrial pressure of 8 mmHg. IAS/Shunts: The atrial septum is grossly normal.  LEFT VENTRICLE PLAX 2D LVIDd:          5.60 cm      Diastology LVIDs:         4.10 cm      LV e' medial:    6.09 cm/s LV PW:         1.00 cm      LV E/e' medial:  12.8 LV IVS:        0.90 cm      LV e' lateral:   7.51 cm/s LVOT diam:     2.00 cm      LV E/e' lateral: 10.4 LV SV:         60 LV SV Index:   27 LVOT Area:     3.14 cm  LV Volumes (MOD) LV vol d, MOD A2C: 133.0 ml LV vol d, MOD A4C: 147.0 ml LV vol s, MOD A2C: 54.0 ml LV vol s, MOD A4C: 71.0 ml LV SV MOD A2C:     79.0 ml LV SV MOD A4C:     147.0 ml LV SV MOD BP:      76.4 ml RIGHT  VENTRICLE             IVC RV Basal diam:  5.10 cm     IVC diam: 2.10 cm RV Mid diam:    4.40 cm RV S prime:     13.30 cm/s TAPSE (M-mode): 2.8 cm LEFT ATRIUM             Index        RIGHT ATRIUM           Index LA diam:        3.30 cm 1.50 cm/m   RA Area:     22.60 cm LA Vol (A2C):   73.8 ml 33.58 ml/m  RA Volume:   71.10 ml  32.35 ml/m LA Vol (A4C):   48.1 ml 21.88 ml/m LA Biplane Vol: 63.0 ml 28.66 ml/m  AORTIC VALVE AV Area (Vmax): 2.10 cm AV Vmax:        119.00 cm/s AV Peak Grad:   5.7 mmHg LVOT Vmax:      79.60 cm/s LVOT Vmean:     58.700 cm/s LVOT VTI:       0.191 m  AORTA Ao Root diam: 3.70 cm Ao Asc diam:  3.50 cm MITRAL VALVE MV Area (PHT): 3.51 cm    SHUNTS MV Decel Time: 216 msec    Systemic VTI:  0.19 m MV E velocity: 78.00 cm/s  Systemic Diam: 2.00 cm MV A velocity: 78.80 cm/s MV E/A ratio:  0.99 Eleonore Chiquito MD Electronically signed by Eleonore Chiquito MD Signature Date/Time: 09/08/2021/4:58:53 PM    Final      LOS: 3 days   Signature  Lala Lund M.D on 09/10/2021 at 10:43 AM   -  To page go to [email or web address]

## 2021-09-10 NOTE — Plan of Care (Signed)
°  Problem: Clinical Measurements: °Goal: Cardiovascular complication will be avoided °Outcome: Progressing °  °Problem: Activity: °Goal: Risk for activity intolerance will decrease °Outcome: Progressing °  °

## 2021-09-11 LAB — GLUCOSE, CAPILLARY
Glucose-Capillary: 302 mg/dL — ABNORMAL HIGH (ref 70–99)
Glucose-Capillary: 320 mg/dL — ABNORMAL HIGH (ref 70–99)
Glucose-Capillary: 291 mg/dL — ABNORMAL HIGH (ref 70–99)

## 2021-09-11 MED ORDER — ALBUTEROL SULFATE HFA 108 (90 BASE) MCG/ACT IN AERS: 2.0000 | INHALATION_SPRAY | Freq: Four times a day (QID) | RESPIRATORY_TRACT | 0 refills | Status: DC | PRN

## 2021-09-11 MED ORDER — METFORMIN HCL 500 MG PO TABS: 500.0000 mg | ORAL_TABLET | Freq: Two times a day (BID) | ORAL | 0 refills | Status: DC

## 2021-09-11 MED ORDER — DEXAMETHASONE 2 MG PO TABS: ORAL_TABLET | ORAL | 0 refills | Status: DC

## 2021-09-11 MED ORDER — DOXYCYCLINE HYCLATE 100 MG PO TABS: 100.0000 mg | ORAL_TABLET | Freq: Two times a day (BID) | ORAL | 0 refills | Status: DC

## 2021-09-11 MED ORDER — CEPHALEXIN 500 MG PO CAPS: 500.0000 mg | ORAL_CAPSULE | Freq: Three times a day (TID) | ORAL | 0 refills | Status: DC

## 2021-09-11 MED ORDER — INSULIN LISPRO (1 UNIT DIAL) 100 UNIT/ML (KWIKPEN): PEN_INJECTOR | SUBCUTANEOUS | 0 refills | Status: DC

## 2021-09-11 MED ORDER — INSULIN STARTER KIT- SYRINGES (ENGLISH)
1.0000 | Freq: Once | Status: AC
  Administered 2021-09-11: 1
  Filled 2021-09-11: qty 1

## 2021-09-11 MED ORDER — INSULIN GLARGINE-YFGN 100 UNIT/ML ~~LOC~~ SOLN
12.0000 [IU] | Freq: Every day | SUBCUTANEOUS | Status: DC
  Administered 2021-09-11: 12 [IU] via SUBCUTANEOUS
  Filled 2021-09-11: qty 0.12

## 2021-09-11 NOTE — Progress Notes (Addendum)
Inpatient Diabetes Program Recommendations  AACE/ADA: New Consensus Statement on Inpatient Glycemic Control   Target Ranges:  Prepandial:   less than 140 mg/dL      Peak postprandial:   less than 180 mg/dL (1-2 hours)      Critically ill patients:  140 - 180 mg/dL   Results for [name], [name] (MRN [id number]) as of 09/11/2021 10:36  Ref. Range 09/10/2021 06:48 09/10/2021 11:54 09/10/2021 16:24 09/10/2021 20:48 09/11/2021 07:00 09/11/2021 07:59  Glucose-Capillary Latest Ref Range: 70 - 99 mg/dL 293 (H) 309 (H) 331 (H) 366 (H) 302 (H) 320 (H)   Results for Donald, Park (MRN [id number]) as of 09/11/2021 10:36  Ref. Range 03/10/2021 10:38 09/07/2021 06:59  Hemoglobin A1C Latest Ref Range: 4.8 - 5.6 % 8.3 (H) 7.1 (H)   Review of Glycemic Control  Diabetes history: DM2 Outpatient Diabetes medications: Amaryl 2 mg BID Current orders for Inpatient glycemic control: Semglee 12 units daily, Novolog 0-15 units TID with meals, Novolog 0-5 units QHS, Novolog 2 units TID with meals; Solumedrol 40 mg daily  Inpatient Diabetes Program Recommendations:    HbgA1C: A1C 7.1% on 09/07/21 indicating an average glucose of 157 mg/dl over the past 2-3 months.  NOTE: Noted consult for diabetes coordinator for DM and insulin education. Diabetes coordinator working remotely. Chart reviewed. Patient has DM hx and takes oral DM medication outpatient and current A1C 7.1% indicating fairly good glycemic control. Anticipate steroids patient is currently ordered are cause of hyperglycemia noted while inpatient. Called patient's room and his wife answered. She reports that patient is having a test done at the moment and to call back later. Sent communication to [place], Therapist, sports to ask that he do insulin teaching with patient (requested by Dr. Candiss Norse).   Addendum 09/11/21_0 :40-Spoke with patient over the phone regarding DM. Patient reports that he take Amaryl 2 mg BID for outpatient DM and is followed by PCP. Patient reports he checks glucose  at home an it is usually 88-low 100's mg/dl with rare glucose of 200 mg/dl.  Patient states he is surprised at how high his glucose has been here. Discussed that he is ordered Solumedrol which is a steroid and can cause hyperglycemia. Discussed current insulin orders with steroids. Inquired about any prior use of insulin and patient states that he has never used insulin but would be open to taking it at home if needed. Explained that if he is discharged on steroids, his glucose is likely going to run high at home. Discussed Semglee and Novolog insulin in detail and how they work. Informed patient that bedside RN should be instructing him on insulin administration if Dr. Candiss Norse is going to discharge him on insulin. Discussed insulin storage, insulin injection sites, importance of rotating sights, and monitoring glucose. Explained that if he is discharged home on steroids and insulin, that after the steroids are completed and wear off his glucose should improve. Patient states that he has never had hypoglycemia that he is aware of. Discussed hypoglycemia, symptoms, along with treatment. Encouraged patient to reach out to his PCP if he has any issues at all with hypoglycemia so his PCP can advise him of what changes to make with DM medications. Patient states that he has a follow up appointment with his PCP on 09/18/21.  Encouraged patient to check glucose 2-3 times a day and to take glucometer with him to follow up appointments. Patient verbalized understanding of information and states he has no questions at this time. Ordered an insulin starter  kit and sent communication to [place], RN to be sure to educate patient on insulin administration.  Thanks, Barnie Alderman, RN, MSN, CDE Diabetes Coordinator Inpatient Diabetes Program [phone] (Team Pager from 8am to 5pm)

## 2021-09-11 NOTE — Plan of Care (Signed)
  Problem: Education: Goal: Knowledge of General Education information will improve Description: Including pain rating scale, medication(s)/side effects and non-pharmacologic comfort measures 09/11/2021 1041 by Dolores Hoose, RN Outcome: Adequate for Discharge 09/11/2021 0745 by Dolores Hoose, RN Outcome: Progressing   Problem: Health Behavior/Discharge Planning: Goal: Ability to manage health-related needs will improve Outcome: Adequate for Discharge   Problem: Clinical Measurements: Goal: Ability to maintain clinical measurements within normal limits will improve Outcome: Adequate for Discharge Goal: Will remain free from infection 09/11/2021 1041 by Dolores Hoose, RN Outcome: Adequate for Discharge 09/11/2021 0745 by Dolores Hoose, RN Outcome: Progressing Goal: Diagnostic test results will improve Outcome: Adequate for Discharge Goal: Respiratory complications will improve 09/11/2021 1041 by Dolores Hoose, RN Outcome: Adequate for Discharge 09/11/2021 0745 by Dolores Hoose, RN Outcome: Progressing Goal: Cardiovascular complication will be avoided Outcome: Adequate for Discharge   Problem: Activity: Goal: Risk for activity intolerance will decrease Outcome: Adequate for Discharge   Problem: Safety: Goal: Ability to remain free from injury will improve Outcome: Adequate for Discharge   Problem: Skin Integrity: Goal: Risk for impaired skin integrity will decrease Outcome: Adequate for Discharge

## 2021-09-11 NOTE — Discharge Summary (Signed)
[name] VWU:[id number] DOB: 1946-10-11 DOA: 09/06/2021  PCP: [name], MD  Admit date: 09/06/2021  Discharge date: 09/11/2021  Admitted From: Home   Disposition:  Home   Recommendations for Outpatient Follow-up:   Follow up with PCP in 1-2 weeks  PCP Please obtain BMP/CBC, 2 view CXR in 1week,  (see Discharge instructions)   PCP Please follow up on the following pending results: Check EKG to monitor QTC on Zoloft, repeat CBC, BMP, magnesium, 2 view chest x-ray in 7 to 10 days.  Monitor blood pressure closely.   Home Health: None   Equipment/Devices: None  Consultations: None  Discharge Condition: Stable    CODE STATUS: Full    Diet Recommendation: Heart Healthy Low Carb  Diet Order             Diet heart healthy/carb modified Room service appropriate? Yes; Fluid consistency: Thin  Diet effective now                    Chief Complaint  Patient presents with   Generalized Body Aches    Sats of 90% at home.     Brief history of present illness from the day of admission and additional interim summary    Patient is a 75 y.o. male with history of COPD, DM-2, HTN, CAD, PAD who presented with fever, cough, shortness of breath-found to have hypoxia due to probable COVID-19 pneumonia.     Patient is unvaccinated-however he did have COVID-19 infection a few years back.                                                                 Hospital Course    Acute hypoxic respiratory failure due to COVID-19 pneumonia: He was treated with combinations of steroids, Remdesivir along with antibiotics as there was suspicion of coexisting bacterial infection as well, clinically improved on RA at rest, did not qualify for home o2 upon ambulation.  He is much better and symptom-free will be discharged home on  gentle steroid taper, 3 more days of oral antibiotics with outpatient PCP follow-up.   Elevated D-dimer: Due to COVID-19 infection-Dopplers negative for DVT.  On prophylactic Lovenox & trending down.   HTN: BP low, blood pressure medications adjusted upon discharge PCP to monitor and adjust next visit.  COPD: Not in exacerbation-continue bronchodilators   Hypothyroidism: Continue Synthroid   HLD: Continue statin   History of PAD: Stable-continue dual antiplatelet agents/statin.   Prolonged QTC: Request PCP to repeat EKG once he resumes Zoloft again.   Obesity BMI of 34 follow with PCP.    DM-2 with uncontrolled hyperglycemia due to steroids: Has testing supplies at home, placed on Glucophage and sliding scale as he is on steroids.  Lab Results  Component Value Date  HGBA1C 7.1 (H) 09/07/2021      Discharge diagnosis     Principal Problem:   EOFHQ-19 virus infection Active Problems:   Diabetes mellitus type 2 with neurological manifestations ([facility])   Hypertension associated with type 2 diabetes mellitus ([facility])   COPD (chronic obstructive pulmonary disease) ([facility])   Hypoxemia   Prolonged QT interval    Discharge instructions    Discharge Instructions     Discharge instructions   Complete by: As directed    Follow with Primary MD [name], MD in 7 days   Get CBC, CMP, 2 view Chest X ray -  checked next visit within 1 week by Primary MD    Activity: As tolerated with Full fall precautions use walker/cane & assistance as needed  Disposition Home    Diet: Heart Healthy Low Carb.  Accuchecks 4 times/day, Once in AM empty stomach and then before each meal. Log in all results and show them to your Prim.MD in 3 days. If any glucose reading is under 80 or above 300 call your Prim MD immidiately. Follow Low glucose instructions for glucose under 80 as instructed.  Special Instructions: If you have smoked or chewed Tobacco  in the last 2 yrs please stop smoking,  stop any regular Alcohol  and or any Recreational drug use.  On your next visit with your primary care physician please Get Medicines reviewed and adjusted.  Please request your Prim.MD to go over all Hospital Tests and Procedure/Radiological results at the follow up, please get all Hospital records sent to your Prim MD by signing hospital release before you go home.  If you experience worsening of your admission symptoms, develop shortness of breath, life threatening emergency, suicidal or homicidal thoughts you must seek medical attention immediately by calling 911 or calling your MD immediately  if symptoms less severe.  You Must read complete instructions/literature along with all the possible adverse reactions/side effects for all the Medicines you take and that have been prescribed to you. Take any new Medicines after you have completely understood and accpet all the possible adverse reactions/side effects.   Increase activity slowly   Complete by: As directed    MyChart COVID-19 home monitoring program   Complete by: Sep 11, 2021    Is the patient willing to use the [place] for home monitoring?: Yes   Temperature monitoring   Complete by: Sep 11, 2021    After how many days would you like to receive a notification of this patient's flowsheet entries?: 1       Discharge Medications   Allergies as of 09/11/2021       Reactions   Prednisone Itching        Medication List     STOP taking these medications    amLODipine 2.5 MG tablet Commonly known as: NORVASC   amLODipine 5 MG tablet Commonly known as: NORVASC   losartan 50 MG tablet Commonly known as: COZAAR       TAKE these medications    Accu-Chek FastClix Lancets Misc Use to check blood sugar 3-4 times a week.  Dx code: E11.9   Accu-Chek Guide Me w/Device Kit Use to check blood sugar 3-4 times a week.  Dx code: E11.9   Accu-Chek Guide test strip Generic drug: glucose blood Use to check blood  sugar 3-4 times a week.  Dx code: E11.9   albuterol 108 (90 Base) MCG/ACT inhaler Commonly known as: ProAir HFA Inhale 2 puffs into the  lungs every 6 (six) hours as needed for shortness of breath.   ALPRAZolam 0.25 MG tablet Commonly known as: XANAX TAKE 1 TABLET BY MOUTH  TWICE DAILY AS NEEDED FOR  ANXIETY What changed:  when to take this reasons to take this additional instructions   aspirin EC 81 MG tablet Take 81 mg by mouth at bedtime.   atorvastatin 80 MG tablet Commonly known as: LIPITOR TAKE 1 TABLET BY MOUTH  EVERY DAY AT 6PM What changed: See the new instructions.   carvedilol 25 MG tablet Commonly known as: COREG TAKE ONE-HALF TABLET BY  MOUTH TWICE DAILY WITH  MEALS What changed: See the new instructions.   cephALEXin 500 MG capsule Commonly known as: KEFLEX Take 1 capsule (500 mg total) by mouth 3 (three) times daily. What changed: Another medication with the same name was removed. Continue taking this medication, and follow the directions you see here.   cholecalciferol 1000 units tablet Commonly known as: VITAMIN D Take 1,000 Units by mouth daily.   clopidogrel 75 MG tablet Commonly known as: PLAVIX TAKE 1 TABLET (75 MG TOTAL) BY MOUTH DAILY WITH BREAKFAST.   dexamethasone 2 MG tablet Commonly known as: DECADRON Take 3 pills for 2 days, then 2 pills for 3 days then 1 pill for 3 days and stop   doxycycline 100 MG tablet Commonly known as: VIBRA-TABS Take 1 tablet (100 mg total) by mouth every 12 (twelve) hours.   Fish Oil 1000 MG Caps Take 1,000 mg by mouth 2 (two) times daily.   glimepiride 2 MG tablet Commonly known as: AMARYL TAKE 1 TABLET BY MOUTH 2 TIMES DAILY.   HYDROcodone-acetaminophen 10-325 MG tablet Commonly known as: NORCO Take 1 tablet by mouth every 12 (twelve) hours as needed for moderate pain.   insulin lispro 100 UNIT/ML KwikPen Commonly known as: HumaLOG KwikPen Before each meal 3 times a day, 140-199 - 3 units, 200-250 - 6  units, 251-299 - 8 units,  300-349 - 10 units,  350 or above 12 units. Insulin PEN if approved, provide syringes and needles if needed.   Latanoprost 0.005 % Emul Place 1 drop into both eyes in the morning and at bedtime.   levothyroxine 25 MCG tablet Commonly known as: SYNTHROID Take 1 tablet (25 mcg total) by mouth daily.   Magnesium 500 MG Tabs Take 500 mg by mouth daily.   metFORMIN 500 MG tablet Commonly known as: Glucophage Take 1 tablet (500 mg total) by mouth 2 (two) times daily with a meal.   multivitamin tablet Take 1 tablet by mouth daily.   pregabalin 150 MG capsule Commonly known as: LYRICA Take 1 capsule (150 mg total) by mouth 2 (two) times daily. What changed: when to take this   sertraline 50 MG tablet Commonly known as: ZOLOFT Take 1 tablet (50 mg total) by mouth daily. What changed: when to take this   tiZANidine 2 MG tablet Commonly known as: ZANAFLEX Take 0.5-2 tablets (1-4 mg total) by mouth 2 (two) times daily as needed for muscle spasms.   vitamin B-12 1000 MCG tablet Commonly known as: CYANOCOBALAMIN Take 1,000 mcg by mouth See admin instructions. Take one tablet by mouth twice weekly on Monday and Thursday per wife   vitamin C 1000 MG tablet Take 1,000 mg by mouth daily.   zinc gluconate 50 MG tablet Take 50 mg by mouth daily.         Follow-up Information     [name], MD. Schedule an  appointment as soon as possible for a visit in 1 week(s).   Specialty: Family Medicine Contact information: [address] [place] [address] [phone]         Lorretta Harp, MD .   Specialties: Cardiology, Radiology Contact information: [address] [place] [address] [phone]                 Major procedures and Radiology Reports - PLEASE review detailed and final reports thoroughly  -        DG Chest 2 View  Result Date: 09/06/2021 CLINICAL DATA:  Cough, COVID positive  EXAM: CHEST - 2 VIEW COMPARISON:  12/04/2019 FINDINGS: Lungs are clear.  No pleural effusion or pneumothorax. The heart is normal in size.  Thoracic aortic atherosclerosis. Degenerative changes of the visualized thoracolumbar spine. IMPRESSION: Normal chest radiographs in this patient with known COVID. Electronically Signed   By: Julian Hy M.D.   On: 09/06/2021 22:13   DG Chest Port 1 View  Result Date: 09/09/2021 CLINICAL DATA:  75 year old male with cough shortness of breath EXAM: PORTABLE CHEST 1 VIEW COMPARISON:  09/08/2021 FINDINGS: Cardiomediastinal silhouette unchanged in size and contour. Calcifications of the aortic arch. No pneumothorax. Mixed reticulonodular opacities at the left lung base, appear improved from the prior. No new right-sided airspace disease. No large pleural effusion. IMPRESSION: Persisting small volume interstitial/airspace disease at the left lung base Electronically Signed   By: Corrie Mckusick D.O.   On: 09/09/2021 10:50   DG Chest Port 1V same Day  Result Date: 09/08/2021 CLINICAL DATA:  Shortness of breath, COVID positive EXAM: PORTABLE CHEST 1 VIEW COMPARISON:  Previous studies including the examination 09/06/2021 FINDINGS: Transverse diameter of heart is increased. There are new patchy infiltrates in the left lower lung fields partly obscuring the left hemidiaphragm. Left lateral CP angle is indistinct. Rest of the lung fields are unremarkable. Azygous fissure is seen in right upper lung fields. There is no pneumothorax. IMPRESSION: New patchy infiltrates are seen in the left lower lung fields suggesting pneumonia. Small left pleural effusion. Electronically Signed   By: Elmer Picker M.D.   On: 09/08/2021 08:43   ECHOCARDIOGRAM COMPLETE  Result Date: 09/08/2021    ECHOCARDIOGRAM REPORT   Patient Name:   [name] Date of Exam: 09/08/2021 Medical Rec #:  [id number]     Height:       69.0 in Accession #:    [id number]    Weight:       231.4 lb Date of  Birth:  1945-12-03    BSA:          2.198 m Patient Age:    50 years      BP:           111/64 mmHg Patient Gender: M             HR:           70 bpm. Exam Location:  Inpatient Procedure: 2D Echo, Cardiac Doppler, Color Doppler and Intracardiac            Opacification Agent Indications:    Respiratory distress  History:        Patient has prior history of Echocardiogram examinations. COPD;                 Risk Factors:Diabetes and Hypertension.  Sonographer:    Jyl [name] Referring Phys: [place]  1. Left ventricular ejection fraction,  by estimation, is 55 to 60%. The left ventricle has normal function. The left ventricle has no regional wall motion abnormalities. Left ventricular diastolic parameters were normal.  2. Right ventricular systolic function is normal. The right ventricular size is mildly enlarged. Tricuspid regurgitation signal is inadequate for assessing PA pressure.  3. The mitral valve is grossly normal. No evidence of mitral valve regurgitation. No evidence of mitral stenosis.  4. The aortic valve is tricuspid. There is mild calcification of the aortic valve. Aortic valve regurgitation is not visualized. No aortic stenosis is present.  5. The inferior vena cava is dilated in size with >50% respiratory variability, suggesting right atrial pressure of 8 mmHg. FINDINGS  Left Ventricle: Left ventricular ejection fraction, by estimation, is 55 to 60%. The left ventricle has normal function. The left ventricle has no regional wall motion abnormalities. Definity contrast agent was given IV to delineate the left ventricular  endocardial borders. The left ventricular internal cavity size was normal in size. There is no left ventricular hypertrophy. Left ventricular diastolic parameters were normal. Right Ventricle: The right ventricular size is mildly enlarged. No increase in right ventricular wall thickness. Right ventricular systolic function is normal. Tricuspid  regurgitation signal is inadequate for assessing PA pressure. Left Atrium: Left atrial size was normal in size. Right Atrium: Right atrial size was normal in size. Pericardium: Trivial pericardial effusion is present. Presence of pericardial fat pad. Mitral Valve: The mitral valve is grossly normal. No evidence of mitral valve regurgitation. No evidence of mitral valve stenosis. Tricuspid Valve: The tricuspid valve is grossly normal. Tricuspid valve regurgitation is not demonstrated. No evidence of tricuspid stenosis. Aortic Valve: The aortic valve is tricuspid. There is mild calcification of the aortic valve. Aortic valve regurgitation is not visualized. No aortic stenosis is present. Aortic valve peak gradient measures 5.7 mmHg. Pulmonic Valve: The pulmonic valve was grossly normal. Pulmonic valve regurgitation is not visualized. No evidence of pulmonic stenosis. Aorta: The aortic root and ascending aorta are structurally normal, with no evidence of dilitation. Venous: The inferior vena cava is dilated in size with greater than 50% respiratory variability, suggesting right atrial pressure of 8 mmHg. IAS/Shunts: The atrial septum is grossly normal.  LEFT VENTRICLE PLAX 2D LVIDd:         5.60 cm      Diastology LVIDs:         4.10 cm      LV e' medial:    6.09 cm/s LV PW:         1.00 cm      LV E/e' medial:  12.8 LV IVS:        0.90 cm      LV e' lateral:   7.51 cm/s LVOT diam:     2.00 cm      LV E/e' lateral: 10.4 LV SV:         60 LV SV Index:   27 LVOT Area:     3.14 cm  LV Volumes (MOD) LV vol d, MOD A2C: 133.0 ml LV vol d, MOD A4C: 147.0 ml LV vol s, MOD A2C: 54.0 ml LV vol s, MOD A4C: 71.0 ml LV SV MOD A2C:     79.0 ml LV SV MOD A4C:     147.0 ml LV SV MOD BP:      76.4 ml RIGHT VENTRICLE             IVC RV Basal diam:  5.10 cm     IVC  diam: 2.10 cm RV Mid diam:    4.40 cm RV S prime:     13.30 cm/s TAPSE (M-mode): 2.8 cm LEFT ATRIUM             Index        RIGHT ATRIUM           Index LA diam:        3.30  cm 1.50 cm/m   RA Area:     22.60 cm LA Vol (A2C):   73.8 ml 33.58 ml/m  RA Volume:   71.10 ml  32.35 ml/m LA Vol (A4C):   48.1 ml 21.88 ml/m LA Biplane Vol: 63.0 ml 28.66 ml/m  AORTIC VALVE AV Area (Vmax): 2.10 cm AV Vmax:        119.00 cm/s AV Peak Grad:   5.7 mmHg LVOT Vmax:      79.60 cm/s LVOT Vmean:     58.700 cm/s LVOT VTI:       0.191 m  AORTA Ao Root diam: 3.70 cm Ao Asc diam:  3.50 cm MITRAL VALVE MV Area (PHT): 3.51 cm    SHUNTS MV Decel Time: 216 msec    Systemic VTI:  0.19 m MV E velocity: 78.00 cm/s  Systemic Diam: 2.00 cm MV A velocity: 78.80 cm/s MV E/A ratio:  0.99 Eleonore Chiquito MD Electronically signed by Eleonore Chiquito MD Signature Date/Time: 09/08/2021/4:58:53 PM    Final    VAS [name] LOWER EXTREMITY VENOUS (DVT)  Result Date: 09/07/2021  Lower Venous DVT Study Patient Name:  [name]  Date of Exam:   09/07/2021 Medical Rec #: [id number]      Accession #:    [id number] Date of Birth: 12/24/1945     Patient Gender: M Patient Age:   36 years Exam Location:  [facility] Procedure:      VAS [name] LOWER EXTREMITY VENOUS (DVT) Referring Phys: Oren Binet --------------------------------------------------------------------------------  Indications: Swelling.  Risk Factors: COVID 19 positive. Comparison Study: No prior studies. Performing Technologist: Oliver Hum RVT  Examination Guidelines: A complete evaluation includes B-mode imaging, spectral Doppler, color Doppler, and power Doppler as needed of all accessible portions of each vessel. Bilateral testing is considered an integral part of a complete examination. Limited examinations for reoccurring indications may be performed as noted. The reflux portion of the exam is performed with the patient in reverse Trendelenburg.  +---------+---------------+---------+-----------+----------+--------------+ RIGHT    CompressibilityPhasicitySpontaneityPropertiesThrombus Aging  +---------+---------------+---------+-----------+----------+--------------+ CFV      Full           Yes      Yes                                 +---------+---------------+---------+-----------+----------+--------------+ SFJ      Full                                                        +---------+---------------+---------+-----------+----------+--------------+ FV Prox  Full                                                        +---------+---------------+---------+-----------+----------+--------------+  FV Mid   Full                                                        +---------+---------------+---------+-----------+----------+--------------+ FV DistalFull                                                        +---------+---------------+---------+-----------+----------+--------------+ PFV      Full                                                        +---------+---------------+---------+-----------+----------+--------------+ POP      Full           Yes      Yes                                 +---------+---------------+---------+-----------+----------+--------------+ PTV      Full                                                        +---------+---------------+---------+-----------+----------+--------------+ PERO     Full                                                        +---------+---------------+---------+-----------+----------+--------------+   +---------+---------------+---------+-----------+----------+--------------+ LEFT     CompressibilityPhasicitySpontaneityPropertiesThrombus Aging +---------+---------------+---------+-----------+----------+--------------+ CFV      Full           Yes      Yes                                 +---------+---------------+---------+-----------+----------+--------------+ SFJ      Full                                                         +---------+---------------+---------+-----------+----------+--------------+ FV Prox  Full                                                        +---------+---------------+---------+-----------+----------+--------------+ FV Mid   Full                                                        +---------+---------------+---------+-----------+----------+--------------+  FV DistalFull                                                        +---------+---------------+---------+-----------+----------+--------------+ PFV      Full                                                        +---------+---------------+---------+-----------+----------+--------------+ POP      Full           Yes      Yes                                 +---------+---------------+---------+-----------+----------+--------------+ PTV      Full                                                        +---------+---------------+---------+-----------+----------+--------------+ PERO     Full                                                        +---------+---------------+---------+-----------+----------+--------------+     Summary: RIGHT: - There is no evidence of deep vein thrombosis in the lower extremity.  - No cystic structure found in the popliteal fossa.  LEFT: - There is no evidence of deep vein thrombosis in the lower extremity.  - No cystic structure found in the popliteal fossa.  *See table(s) above for measurements and observations. Electronically signed by Jamelle Haring on 09/07/2021 at 3:19:45 PM.    Final     Micro Results     Recent Results (from the past 240 hour(s))  Resp Panel by RT-PCR (Flu A&B, Covid) Nasopharyngeal Swab     Status: Abnormal   Collection Time: 09/06/21  9:16 PM   Specimen: Nasopharyngeal Swab; Nasopharyngeal(NP) swabs in vial transport medium  Result Value Ref Range Status   SARS Coronavirus 2 by RT PCR POSITIVE (A) NEGATIVE Final    Comment: RESULT CALLED TO, READ  BACK BY AND VERIFIED WITH: RN Justan M. 09/06/21_0 :03 BY TW (NOTE) SARS-CoV-2 target nucleic acids are DETECTED.  The SARS-CoV-2 RNA is generally detectable in upper respiratory specimens during the acute phase of infection. Positive results are indicative of the presence of the identified virus, but do not rule out bacterial infection or co-infection with other pathogens not detected by the test. Clinical correlation with patient history and other diagnostic information is necessary to determine patient infection status. The expected result is Negative.  Fact Sheet for Patients: EntrepreneurPulse.com.au  Fact Sheet for Healthcare Providers: IncredibleEmployment.be  This test is not yet approved or cleared by the [place] FDA and  has been authorized for detection and/or diagnosis of SARS-CoV-2 by FDA under an Emergency Use Authorization (EUA).  This EUA will remain in effect (meaning this test can be  u sed) for the duration of  the COVID-19 declaration under Section 564(b)(1) of the Act, 21 U.S.C. section 360bbb-3(b)(1), unless the authorization is terminated or revoked sooner.     Influenza A by PCR NEGATIVE NEGATIVE Final   Influenza B by PCR NEGATIVE NEGATIVE Final    Comment: (NOTE) The Xpert Xpress SARS-CoV-2/FLU/RSV plus assay is intended as an aid in the diagnosis of influenza from Nasopharyngeal swab specimens and should not be used as a sole basis for treatment. Nasal washings and aspirates are unacceptable for Xpert Xpress SARS-CoV-2/FLU/RSV testing.  Fact Sheet for Patients: EntrepreneurPulse.com.au  Fact Sheet for Healthcare Providers: IncredibleEmployment.be  This test is not yet approved or cleared by the [place] FDA and has been authorized for detection and/or diagnosis of SARS-CoV-2 by FDA under an Emergency Use Authorization (EUA). This EUA will remain in effect (meaning  this test can be used) for the duration of the COVID-19 declaration under Section 564(b)(1) of the Act, 21 U.S.C. section 360bbb-3(b)(1), unless the authorization is terminated or revoked.  Performed at [facility], [place] [address] [place], [place] [address]     Today   Subjective    [name] today has no headache,no chest abdominal pain,no new weakness tingling or numbness, feels much better wants to go home today.     Objective   Blood pressure 132/66, pulse (!) 56, temperature 99.2 F (37.3 C), resp. rate 16, height _0  (1.753 m), weight 105 kg, SpO2 94 %.   Intake/Output Summary (Last 24 hours) at 09/11/2021 1229 Last data filed at 09/11/2021 0739 Gross per 24 hour  Intake 1717.63 ml  Output 400 ml  Net 1317.63 ml    Exam  Awake Alert, No new F.N deficits, Normal affect [place].AT,PERRAL Supple Neck,No JVD, No cervical lymphadenopathy appriciated.  Symmetrical Chest wall movement, Good air movement bilaterally, CTAB RRR,No Gallops,Rubs or new Murmurs, No Parasternal Heave +ve B.Sounds, Abd Soft, Non tender, No organomegaly appriciated, No rebound -guarding or rigidity. No Cyanosis, Clubbing or edema, No new Rash or bruise   Data Review   CBC w Diff:  Lab Results  Component Value Date   WBC 11.1 (H) 09/10/2021   HGB 14.5 09/10/2021   HGB 13.4 03/10/2021   HCT 44.8 09/10/2021   HCT 39.2 03/10/2021   PLT 207 09/10/2021   PLT 314 03/10/2021   LYMPHOPCT 10 09/10/2021   MONOPCT 6 09/10/2021   EOSPCT 0 09/10/2021   BASOPCT 0 09/10/2021    CMP:  Lab Results  Component Value Date   NA 134 (L) 09/10/2021   NA 140 03/10/2021   K 4.4 09/10/2021   CL 99 09/10/2021   CO2 25 09/10/2021   BUN 26 (H) 09/10/2021   BUN 28 (H) 03/10/2021   CREATININE 0.96 09/10/2021   CREATININE 0.82 12/01/2020   PROT 6.6 09/10/2021   PROT 6.5 10/05/2020   ALBUMIN 3.2 (L) 09/10/2021   ALBUMIN 4.2 10/05/2020   BILITOT 0.9 09/10/2021   BILITOT 0.3 10/05/2020   ALKPHOS  74 09/10/2021   AST 22 09/10/2021   ALT 29 09/10/2021  .  Lab Results  Component Value Date   HGBA1C 7.1 (H) 09/07/2021     Total Time in preparing paper work, data evaluation and todays exam - 7 minutes  Lala Lund M.D on 09/11/2021 at 12:29 PM  [facility]

## 2021-09-11 NOTE — Discharge Instructions (Addendum)
Follow with Primary MD [name], MD in 7 days   Get CBC, CMP, 2 view Chest X ray -  checked next visit within 1 week by Primary MD    Activity: As tolerated with Full fall precautions use walker/cane & assistance as needed  Disposition Home    Diet: Heart Healthy Low Carb.  Accuchecks 4 times/day, Once in AM empty stomach and then before each meal. Log in all results and show them to your Prim.MD in 3 days. If any glucose reading is under 80 or above 300 call your Prim MD immidiately. Follow Low glucose instructions for glucose under 80 as instructed.   Special Instructions: If you have smoked or chewed Tobacco  in the last 2 yrs please stop smoking, stop any regular Alcohol  and or any Recreational drug use.  On your next visit with your primary care physician please Get Medicines reviewed and adjusted.  Please request your Prim.MD to go over all Hospital Tests and Procedure/Radiological results at the follow up, please get all Hospital records sent to your Prim MD by signing hospital release before you go home.  If you experience worsening of your admission symptoms, develop shortness of breath, life threatening emergency, suicidal or homicidal thoughts you must seek medical attention immediately by calling 911 or calling your MD immediately  if symptoms less severe.  You Must read complete instructions/literature along with all the possible adverse reactions/side effects for all the Medicines you take and that have been prescribed to you. Take any new Medicines after you have completely understood and accpet all the possible adverse reactions/side effects.

## 2021-09-11 NOTE — Progress Notes (Signed)
DISCHARGE NOTE HOME BRENNER VISCONTI to be discharged Home per MD order. Discussed prescriptions and follow up appointments with the patient. Prescriptions given to patient; medication list explained in detail. Patient verbalized understanding.  Skin clean, dry and intact without evidence of skin break down, no evidence of skin tears noted. IV catheter discontinued intact. Site without signs and symptoms of complications. Dressing and pressure applied. Pt denies pain at the site currently. No complaints noted.  Patient free of lines, drains, and wounds.   An After Visit Summary (AVS) was printed and given to the patient. Patient escorted via wheelchair, and discharged home via private auto.  Dolores Hoose, RN

## 2021-09-11 NOTE — Progress Notes (Signed)
SATURATION QUALIFICATIONS: (This note is used to comply with regulatory documentation for home oxygen)  Patient Saturations on 1L [place] = 95%%  Patient Saturations on Room Air at Rest = 95%  Patient Saturations on Room Air while Ambulating = 89%  Patient Saturations on 1 Liters of oxygen while Ambulating = 93%  Please briefly explain why patient needs home oxygen:  Patient may benefit Oxygen temporarily for home based on the saturations dropping to low 87% while ambulating .

## 2021-09-11 NOTE — Progress Notes (Signed)
Patient education regarding use of insulin pen done at bedside with wife at bedside. Patient receptive to teaching.

## 2021-09-11 NOTE — Plan of Care (Signed)
  Problem: Education: Goal: Knowledge of General Education information will improve Description: Including pain rating scale, medication(s)/side effects and non-pharmacologic comfort measures Outcome: Progressing   Problem: Clinical Measurements: Goal: Will remain free from infection Outcome: Progressing Goal: Respiratory complications will improve Outcome: Progressing   

## 2021-09-12 ENCOUNTER — Encounter: Payer: Self-pay | Admitting: Family Medicine

## 2021-09-12 ENCOUNTER — Telehealth: Payer: Self-pay

## 2021-09-12 NOTE — Telephone Encounter (Signed)
[name], Please see TCM message. Patient is scheduled to see Mackie Pai on 09/20/21 for hosp f/u.[name] Wife is upset that he is not seeing you. She states he had an appt already scheduled with you on the 15th but it was cancelled. It states in the appt notes that it was cancelled via my chart. There was also an appt scheduled on 11/11 which was also cancelled via my chart. There is a Pharmacist, community message from patient's wife to you from earlier today . Just wanted you to be aware that I have scheduled him an appt for the 16th.

## 2021-09-12 NOTE — Telephone Encounter (Signed)
Transition Care Management Follow-up Telephone Call Date of discharge and from where: 09/11/2021 -[facility] How have you been since you were released from the hospital? Feeling ok just tired Any questions or concerns? No  Items Reviewed: Did the pt receive and understand the discharge instructions provided? Yes  Medications obtained and verified? Yes  Other? Yes  Any new allergies since your discharge? No  Dietary orders reviewed? Yes Do you have support at home? Yes   Home Care and Equipment/Supplies: Were home health services ordered? no If so, what is the name of the agency? N/a  Has the agency set up a time to come to the patient's home? not applicable Were any new equipment or medical supplies ordered?  No What is the name of the medical supply agency? N/a Were you able to get the supplies/equipment? not applicable Do you have any questions related to the use of the equipment or supplies? N/a  Functional Questionnaire: (I = Independent and D = Dependent) ADLs: I  Bathing/Dressing- I  Meal Prep- I  Eating- I  Maintaining continence- I  Transferring/Ambulation- I  Managing Meds- I  Follow up appointments reviewed:  PCP Hospital f/u appt confirmed? Yes  Scheduled to see Mackie Pai on 09/20/2021 @ 10:20. [facility] f/u appt confirmed? No  Cardiology to schedule Are transportation arrangements needed? No  If their condition worsens, is the pt aware to call PCP or go to the Emergency Dept.? Yes Was the patient provided with contact information for the PCP's office or ED? Yes Was to pt encouraged to call back with questions or concerns? Yes

## 2021-09-13 NOTE — Telephone Encounter (Signed)
Spoke to Rayburn Go to offer her and Mr. Calab Sachse appointments for Nov 29th with [name] since they had previously stated they have to come in together due to living far away. She stated that since the hospital follow up has to be done within 2 weeks of being discharge, they would keep Mr. Sanchez appointment with Percell Miller on 11/16. She stated again how displeased she was with everything, and that she would be looking for another provider for both of them. I apologized for the confusion with Mychart, and told her to give [name] a call if she needed anything.

## 2021-09-15 ENCOUNTER — Ambulatory Visit: Payer: Medicare Other | Admitting: Family Medicine

## 2021-09-19 ENCOUNTER — Ambulatory Visit: Payer: Medicare Other | Admitting: Family Medicine

## 2021-09-20 ENCOUNTER — Other Ambulatory Visit: Payer: Self-pay

## 2021-09-20 ENCOUNTER — Ambulatory Visit (INDEPENDENT_AMBULATORY_CARE_PROVIDER_SITE_OTHER): Payer: Medicare Other | Admitting: Medical

## 2021-09-20 ENCOUNTER — Encounter: Payer: Self-pay | Admitting: Medical

## 2021-09-20 ENCOUNTER — Ambulatory Visit (HOSPITAL_BASED_OUTPATIENT_CLINIC_OR_DEPARTMENT_OTHER)
Admission: RE | Admit: 2021-09-20 | Discharge: 2021-09-20 | Disposition: A | Discharge status: Home / Self Care | Payer: Medicare Other | Source: Ambulatory Visit | Attending: Medical | Admitting: Medical

## 2021-09-20 VITALS — BP 140/70 | HR 76 | Temp 98.1°F | Ht 69.0 in | Wt 233.0 lb

## 2021-09-20 DIAGNOSIS — Z23 Encounter for immunization: Secondary | ICD-10-CM | POA: Diagnosis not present

## 2021-09-20 DIAGNOSIS — R9431 Abnormal electrocardiogram [ECG] [EKG]: Secondary | ICD-10-CM

## 2021-09-20 DIAGNOSIS — R790 Abnormal level of blood mineral: Secondary | ICD-10-CM | POA: Diagnosis not present

## 2021-09-20 DIAGNOSIS — J1282 Pneumonia due to coronavirus disease 2019: Secondary | ICD-10-CM

## 2021-09-20 DIAGNOSIS — E1149 Type 2 diabetes mellitus with other diabetic neurological complication: Secondary | ICD-10-CM

## 2021-09-20 DIAGNOSIS — U071 COVID-19: Secondary | ICD-10-CM

## 2021-09-20 DIAGNOSIS — J432 Centrilobular emphysema: Secondary | ICD-10-CM | POA: Insufficient documentation

## 2021-09-20 LAB — CBC WITH DIFFERENTIAL/PLATELET
Basophils Absolute: 0 10*3/uL (ref 0.0–0.1)
Basophils Relative: 0.1 % (ref 0.0–3.0)
Eosinophils Absolute: 0 10*3/uL (ref 0.0–0.7)
Eosinophils Relative: 0.1 % (ref 0.0–5.0)
HCT: 44.4 % (ref 39.0–52.0)
Hemoglobin: 14.5 g/dL (ref 13.0–17.0)
Lymphocytes Relative: 12 % (ref 12.0–46.0)
Lymphs Abs: 1.5 10*3/uL (ref 0.7–4.0)
MCHC: 32.8 g/dL (ref 30.0–36.0)
MCV: 92.9 fl (ref 78.0–100.0)
Monocytes Absolute: 0.4 10*3/uL (ref 0.1–1.0)
Monocytes Relative: 3.3 % (ref 3.0–12.0)
Neutro Abs: 10.7 10*3/uL — ABNORMAL HIGH (ref 1.4–7.7)
Neutrophils Relative %: 84.5 % — ABNORMAL HIGH (ref 43.0–77.0)
Platelets: 239 10*3/uL (ref 150.0–400.0)
RBC: 4.78 Mil/uL (ref 4.22–5.81)
RDW: 14.2 % (ref 11.5–15.5)
WBC: 12.7 10*3/uL — ABNORMAL HIGH (ref 4.0–10.5)

## 2021-09-20 LAB — COMPREHENSIVE METABOLIC PANEL
ALT: 32 U/L (ref 0–53)
AST: 18 U/L (ref 0–37)
Albumin: 4.2 g/dL (ref 3.5–5.2)
Alkaline Phosphatase: 81 U/L (ref 39–117)
BUN: 22 mg/dL (ref 6–23)
CO2: 28 mEq/L (ref 19–32)
Calcium: 8.9 mg/dL (ref 8.4–10.5)
Chloride: 99 mEq/L (ref 96–112)
Creatinine, Ser: 0.75 mg/dL (ref 0.40–1.50)
GFR: 89.15 mL/min (ref >60.00)
Glucose, Bld: 193 mg/dL — ABNORMAL HIGH (ref 70–99)
Potassium: 4.8 mEq/L (ref 3.5–5.1)
Sodium: 137 mEq/L (ref 135–145)
Total Bilirubin: 0.6 mg/dL (ref 0.2–1.2)
Total Protein: 6.7 g/dL (ref 6.0–8.3)

## 2021-09-20 LAB — MAGNESIUM: Magnesium: 2.1 mg/dL (ref 1.5–2.5)
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
## 2021-09-20 NOTE — Patient Instructions (Addendum)
Posthospitalization follow-up for COVID infection, covid pneumonia and respiratory failure.  Patient has history of COPD.  Will get chest x-ray as follow-up per discharge summary recommendations.  Can use albuterol if needed for wheezing.  Currently during office visit your oxygen saturation was 93 to 94% at rest.  Hopefully your O2 sats will continue to improve.  If not by the time of follow-up will refer to pulmonologist.  If O2 sats at rest drop less than 93% let me know.  If on ambulation O2 sats dropped less 89% let me know as well.  Diabetes.  Historically last A1c was 7.1.  Formally on Amaryl alone.  However with Decadron use if sugars went up.  Continue to use Humalog sliding scale.  Once your off of Decadron I suspect that your insulin requirements will decrease.  On follow-up decide whether you need to continue sliding scale insulin or if we need to add other oral diabetic medication.  QT prolongation while hospitalized.  In the hospital they advised to repeat EKG once Zoloft was resumed.  On discussion with wife she has been giving Zoloft since his discharge.  EKG today does not show any QT prolongation.  So advised to continue Zoloft.  We will repeat magnesium level.  Hypertension.  Blood pressure 140/70.  Advised to resume losartan but hold off on restarting amlodipine presently.  Hyperlipidemia-continue statin.  Follow-up in 10 days or sooner if needed.

## 2021-09-22 ENCOUNTER — Telehealth: Payer: Self-pay | Admitting: *Deleted

## 2021-09-22 NOTE — Telephone Encounter (Signed)
Attempted to call patient with reported worsening symptom- appetite on MyChart- Companion. Left message to call PCP if needed- information to be sent in [place]

## 2021-10-02 ENCOUNTER — Ambulatory Visit: Payer: Medicare Other

## 2021-10-02 ENCOUNTER — Other Ambulatory Visit: Payer: Self-pay

## 2021-10-02 ENCOUNTER — Encounter: Payer: Self-pay | Admitting: Podiatry

## 2021-10-02 ENCOUNTER — Ambulatory Visit (INDEPENDENT_AMBULATORY_CARE_PROVIDER_SITE_OTHER): Payer: Medicare Other | Admitting: Podiatry

## 2021-10-02 DIAGNOSIS — E11621 Type 2 diabetes mellitus with foot ulcer: Secondary | ICD-10-CM

## 2021-10-02 DIAGNOSIS — L97529 Non-pressure chronic ulcer of other part of left foot with unspecified severity: Secondary | ICD-10-CM

## 2021-10-02 DIAGNOSIS — L97521 Non-pressure chronic ulcer of other part of left foot limited to breakdown of skin: Secondary | ICD-10-CM

## 2021-10-03 ENCOUNTER — Encounter: Payer: Self-pay | Admitting: Medical

## 2021-10-03 ENCOUNTER — Telehealth: Payer: Self-pay | Admitting: Medical

## 2021-10-03 ENCOUNTER — Ambulatory Visit (INDEPENDENT_AMBULATORY_CARE_PROVIDER_SITE_OTHER): Payer: Medicare Other | Admitting: Medical

## 2021-10-03 VITALS — BP 140/68 | HR 77 | Temp 98.2°F | Resp 18 | Ht 69.0 in | Wt 240.6 lb

## 2021-10-03 DIAGNOSIS — J432 Centrilobular emphysema: Secondary | ICD-10-CM | POA: Diagnosis not present

## 2021-10-03 DIAGNOSIS — R0600 Dyspnea, unspecified: Secondary | ICD-10-CM

## 2021-10-03 DIAGNOSIS — E1149 Type 2 diabetes mellitus with other diabetic neurological complication: Secondary | ICD-10-CM | POA: Diagnosis not present

## 2021-10-03 DIAGNOSIS — F418 Other specified anxiety disorders: Secondary | ICD-10-CM

## 2021-10-03 DIAGNOSIS — R062 Wheezing: Secondary | ICD-10-CM | POA: Diagnosis not present

## 2021-10-03 DIAGNOSIS — I10 Essential (primary) hypertension: Secondary | ICD-10-CM | POA: Diagnosis not present

## 2021-10-03 MED ORDER — FLUTICASONE PROPIONATE HFA 110 MCG/ACT IN AERO: 2.0000 | INHALATION_SPRAY | Freq: Two times a day (BID) | RESPIRATORY_TRACT | 0 refills | Status: DC

## 2021-10-03 NOTE — Patient Instructions (Addendum)
Persistent dyspnea and wheezing posthospitalization for COVID-pneumonia.  COPD diagnosis as well.  Reporting some incremental mild improvement but not back to former baseline prior to hospitalization.  O2 sat are staying in the lower 92 to 94% range.  Rare but occasional O2 sat in the 87 to 89% range on ambulation.  Prescribed Flovent steroid inhaler Rx advisement given.  Continue albuterol as needed.  Went ahead and placed referral to pulmonologist today.  If you do not get a call from pulmonologist office by 1 week please let me know by MyChart or call office directly.  If you have worsening or changing respiratory symptoms let me know.  If severe symptoms recommend ED evaluation.  Hypertension well controlled today.  Continue current BP medications. Discussed can add back amlodipine 5 mg daily.  Diabetes with recent A1c in 7 range.  Continue glimepiride.  1 low blood sugar event recently.  He has been on medication for years and described prior hypoglycemia.  Discussed measures and foods/beverages that will bring sugar back up if that occurs again.  If low sugar event does occur let me know as we will touch base with your PCP and see if medication needs to be changed as glimepiride can cause low blood sugars.  Unfortunate were unable to tolerate metformin.  Depression-stable.  Continue sertraline.  Follow-up as regular scheduled with PCP or sooner if needed.

## 2021-10-03 NOTE — Telephone Encounter (Signed)
Spoke with pt and he is aware. 
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
## 2021-10-03 NOTE — Progress Notes (Signed)
SITUATION Reason for Consult: Evaluation for Prefabricated Diabetic Shoes and Bilateral Custom Diabetic Inserts. Patient / Caregiver Report: Patient would like dress shoes  OBJECTIVE DATA: Patient History / Diagnosis: Diabetes Mellitus with complications  Presence of Diabetic Complications: - Peripheral Neuropathy  Current or Previous Devices: Apex hiking shoes  In-Person Foot Examination:  Skin presentation:   Thin, Shiny, Hairless Nail presentation:   Thick, Ingrown, With Fungus Ulcers & Callousing:   Callousing on 2nd claw toe  Toe / Foot Deformities:   - Pes Planus  - Hindfoot Varus  - Forefoot ABduction  - Hammertoes - Midfoot collapse   Sensation:    Diminished bilateral lower extremities  Shoe Size: 9.5XW  ORTHOTIC RECOMMENDATION Recommended Devices: - 1x pair prefabricated PDAC approved diabetic shoes: Apex Y900M - 3x pair custom-to-patient direct CAM carved diabetic insoles.   GOALS OF SHOES AND INSOLES - Reduce shear and pressure - Reduce / Prevent callus formation - Reduce / Prevent ulceration - Protect the fragile healing compromised diabetic foot.  Patient would benefit from diabetic shoes and inserts as patient has diabetes mellitus and the patient has one or more of the following conditions: - History of partial or complete amputation of the foot - History of previous foot ulceration. - History of pre-ulcerative callus - Peripheral neuropathy with evidence of callus formation - Foot deformity - Poor circulation  ACTIONS PERFORMED Patient was casted for insoles via crush box and measured for shoes via brannock device. Procedure was explained and patient tolerated procedure well. All questions were answered and concerns addressed.  PLAN Insurance to be verified and out of pocket cost communicated to patient. Once cost verified and agreed upon and diabetic certification received, casts are to be sent to [facility] for fabrication. Patient is to be called  for fitting when devices are ready.

## 2021-10-03 NOTE — Telephone Encounter (Signed)
[name],  Pt of yours wants cgm. His a1c is 7.1 recently. I am seeing him post covid hospitalization. I don't think he would qualify. Wanted you to be aware of the request. Thanks, Percell Miller

## 2021-10-04 NOTE — Progress Notes (Signed)
Subjective: [name] is a 75 y.o. is seen today for follow-up evaluation of a wound on the second toe.  He has not seen any significant drainage from the area but his wife is noted some bloody drainage on the bandage.  He denies any increase in swelling or redness.  Denies any fevers or chills.  Wife change the bandage.  He has returned to work he needs states he tries to sit at work but he does have to stand and walk.  No fevers or chills.  No other concerns.  Objective: General: No acute distress, AAOx3 -wife present Neurovascular status unchanged Left foot: Wound submetatarsal 1 is healed.  The distal aspect of the second toe is hyperkeratotic lesion with central granular wound.  Prior debridement the wound was small almost With callus but after debridement measures 0.5 x 0.3 cm and superficial any probing, undermining or tunneling.  This is edema.  No ascending cellulitis.  No fluctuance crepitation there is no malodor. No pain with calf compression, swelling, warmth, erythema.   Assessment and Plan:  Ulceration left second toe  -Treatment options discussed including all alternatives, risks, and complications -Sharply debrided the hyperkeratotic tissue, the wound down to healthy, granular tissue without any complications on the second digit.  No significant blood loss.  Tolerated well.  Recommend to continue with daily dressing changes.  Continue offloading at all times particularly while working.  Discussed when he is not working to wear the surgical shoe ideally I like him with his all the time but he is not able to work and wear this. -Monitor for any clinical signs or symptoms of infection and directed to call the office immediately should any occur or go to the ER.  Return in about 2 weeks (around 10/16/2021) for toe ulcer 2nd toe left.  [name]

## 2021-10-10 ENCOUNTER — Telehealth: Payer: Self-pay | Admitting: Internal Medicine

## 2021-10-10 ENCOUNTER — Other Ambulatory Visit: Payer: Self-pay

## 2021-10-10 ENCOUNTER — Encounter: Payer: Self-pay | Admitting: Internal Medicine

## 2021-10-10 ENCOUNTER — Ambulatory Visit (INDEPENDENT_AMBULATORY_CARE_PROVIDER_SITE_OTHER): Payer: Medicare Other | Admitting: Internal Medicine

## 2021-10-10 VITALS — BP 136/78 | HR 76 | Ht 69.0 in | Wt 239.2 lb

## 2021-10-10 DIAGNOSIS — G4733 Obstructive sleep apnea (adult) (pediatric): Secondary | ICD-10-CM | POA: Diagnosis not present

## 2021-10-10 DIAGNOSIS — J449 Chronic obstructive pulmonary disease, unspecified: Secondary | ICD-10-CM | POA: Diagnosis not present

## 2021-10-10 MED ORDER — ANORO ELLIPTA 62.5-25 MCG/ACT IN AEPB: 1.0000 | INHALATION_SPRAY | Freq: Every day | RESPIRATORY_TRACT | 5 refills | Status: DC

## 2021-10-10 NOTE — Progress Notes (Signed)
The patient has been prescribed the inhaler Anoro. Inhaler technique was demonstrated to patient. The patient subsequently demonstrated correct technique.

## 2021-10-10 NOTE — Patient Instructions (Addendum)
Please schedule follow up scheduled with myself in 2 months.  If my schedule is not open yet, we will contact you with a reminder closer to that time. Please call [phone] if you haven't heard from [name] a month before.   Before your next visit I would like you to have:  Full set of PFTs - 45 min, appointment with me afterwards.   Start Anoro 1 puff once/day. Continue albuterol as needed.   Understanding COPD   What is COPD? COPD stands for chronic obstructive pulmonary (lung) disease. COPD is a general term used for several lung diseases.  COPD is an umbrella term and encompasses other  common diseases in this group like chronic bronchitis and emphysema. Chronic asthma may also be included in this group. While some patients with COPD have only chronic bronchitis or emphysema, most patients have a combination of both.  You might hear these terms used in exchange for one another.   COPD adds to the work of the heart. Diseased lungs may reduce the amount of oxygen that goes to the blood. High blood pressure in blood vessels from the heart to the lungs makes it difficult for the heart to pump. Lung disease can also cause the body to produce too many red blood cells which may make the blood thicker and harder to pump.   Patients who have COPD with low oxygen levels may develop an enlarged heart (cor pulmonale). This condition weakens the heart and causes increased shortness of breath and swelling in the legs and feet.   Chronic bronchitis Chronic bronchitis is irritation and inflammation (swelling) of the lining in the bronchial tubes (air passages). The irritation causes coughing and an excess amount of mucus in the airways. The swelling makes it difficult to get air in and out of the lungs. The small, hair-like structures on the inside of the airways (called cilia) may be damaged by the irritation. The cilia are then unable to help clean mucus from the airways.  Bronchitis is generally considered  to be chronic when you have: a productive cough (cough up mucus) and shortness of breath that lasts about 3 months or more each year for 2 or more years in a row. Your doctor may define chronic bronchitis differently.   Emphysema Emphysema is the destruction, or breakdown, of the walls of the alveoli (air sacs) located at the end of the bronchial tubes. The damaged alveoli are not able to exchange oxygen and carbon dioxide between the lungs and the blood. The bronchioles lose their elasticity and collapse when you exhale, trapping air in the lungs. The trapped air keeps fresh air and oxygen from entering the lungs.   Who is affected by COPD? Emphysema and chronic bronchitis affect approximately 16 million people in the [place], or close to 11 percent of the population.   Symptoms of COPD  Shortness of breath  Shortness of breath with mild exercise (walking, using the stairs, etc.)  Chronic, productive cough (with mucus)  A feeling of "tightness" in the chest  Wheezing   What causes COPD? The two primary causes of COPD are cigarette smoking and alpha1-antitrypsin (AAT) deficiency. Air pollution and occupational dusts may also contribute to COPD, especially when the person exposed to these substances is a cigarette smoker.  Cigarette smoke causes COPD by irritating the airways and creating inflammation that narrows the airways, making it more difficult to breathe. Cigarette smoke also causes the cilia to stop working properly so mucus and trapped particles  are not cleaned from the airways. As a result, chronic cough and excess mucus production develop, leading to chronic bronchitis.  In some people, chronic bronchitis and infections can lead to destruction of the small airways, or emphysema.  AAT deficiency, an inherited disorder, can also lead to emphysema. Alpha antitrypsin (AAT) is a protective material produced in the liver and transported to the lungs to help combat inflammation. When  there is not enough of the chemical AAT, the body is no longer protected from an enzyme in the white blood cells.   How is COPD diagnosed?  To diagnose COPD, the physician needs to know: Do you smoke?  Have you had chronic exposure to dust or air pollutants?  Do other members of your family have lung disease?  Are you short of breath?  Do you get short of breath with exercise?  Do you have chronic cough and/or wheezing?  Do you cough up excess mucus?  To help with the diagnosis, the physician will conduct a thorough physical exam which includes:  Listening to your lungs and heart  Checking your blood pressure and pulse  Examining your nose and throat  Checking your feet and ankles for swelling   Laboratory and other tests Several laboratory and other tests are needed to confirm a diagnosis of COPD. These tests may include:  Chest X-ray to look for lung changes that could be caused by COPD   Spirometry and pulmonary function tests (PFTs) to determine lung volume and air flow  Pulse oximetry to measure the saturation of oxygen in the blood  Arterial blood gases (ABGs) to determine the amount of oxygen and carbon dioxide in the blood  Exercise testing to determine if the oxygen level in the blood drops during exercise   Treatment In the beginning stages of COPD, there is minimal shortness of breath that may be noticed only during exercise. As the disease progresses, shortness of breath may worsen and you may need to wear an oxygen device.   To help control other symptoms of COPD, the following treatments and lifestyle changes may be prescribed.  Quitting smoking  Avoiding cigarette smoke and other irritants  Taking medications including: a. bronchodilators b. anti-inflammatory agents c. oxygen d. antibiotics  Maintaining a healthy diet  Following a structured exercise program such as pulmonary rehabilitation Preventing respiratory infections  Controlling stress   If your COPD  progresses, you may be eligible to be evaluated for lung volume reduction surgery or lung transplantation. You may also be eligible to participate in certain clinical trials (research studies). Ask your health care providers about studies being conducted in your hospital.   What is the outlook? Although COPD can not be cured, its symptoms can be treated and your quality of life can be improved. Your prognosis or outlook for the future will depend on how well your lungs are functioning, your symptoms, and how well you respond to and follow your treatment plan.
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
## 2021-10-12 MED ORDER — SPIRIVA RESPIMAT 2.5 MCG/ACT IN AERS: 2.0000 | INHALATION_SPRAY | Freq: Every day | RESPIRATORY_TRACT | 0 refills | Status: DC

## 2021-10-12 MED ORDER — SPIRIVA RESPIMAT 2.5 MCG/ACT IN AERS: 2.0000 | INHALATION_SPRAY | Freq: Every day | RESPIRATORY_TRACT | 5 refills | Status: DC

## 2021-10-12 NOTE — Telephone Encounter (Signed)
Fine to switch to spiriva and yes please give him samples to get him to the end of the year.

## 2021-10-12 NOTE — Telephone Encounter (Signed)
Called and spoke with pt's spouse [name] letting her know that Dr. Shearon Stalls was fine with pt switching to spiriva and she verbalized understanding. Stated to her that samples have been placed up front and also Rx has been sent to pharmacy. Nothing further needed.

## 2021-10-12 NOTE — Telephone Encounter (Signed)
Called and spoke to pt's wife, [name]. She states the Anoro is too expensive for pt to afford. Per EMR, the Anoro is at a Tier 3 along with alternatives: Spiriva, Tudorza, Combivent, Atrovent, Bevespi. The pts insurance is going to change after the first of the year so pt assistance and tier exception are fruitless. There are 8 samples of Anoro but there are nearly 200 samples of Spiriva.    Dr. Shearon Stalls, please advise if ok to provide samples for pt to last until the first of the year? And if ok to switch to Spiriva because we have an abundance of this sample vs Anoro. Thank you!

## 2021-10-13 ENCOUNTER — Other Ambulatory Visit: Payer: Self-pay | Admitting: Family Medicine

## 2021-10-13 DIAGNOSIS — I152 Hypertension secondary to endocrine disorders: Secondary | ICD-10-CM

## 2021-10-13 DIAGNOSIS — E1159 Type 2 diabetes mellitus with other circulatory complications: Secondary | ICD-10-CM

## 2021-10-15 ENCOUNTER — Other Ambulatory Visit: Payer: Self-pay | Admitting: Family Medicine

## 2021-10-16 ENCOUNTER — Other Ambulatory Visit: Payer: Self-pay | Admitting: Family Medicine

## 2021-10-17 ENCOUNTER — Ambulatory Visit (INDEPENDENT_AMBULATORY_CARE_PROVIDER_SITE_OTHER): Payer: Medicare Other | Admitting: Podiatry

## 2021-10-17 ENCOUNTER — Encounter: Payer: Self-pay | Admitting: Family Medicine

## 2021-10-17 ENCOUNTER — Other Ambulatory Visit: Payer: Self-pay

## 2021-10-17 ENCOUNTER — Ambulatory Visit: Payer: Medicare Other

## 2021-10-17 ENCOUNTER — Other Ambulatory Visit: Payer: Self-pay | Admitting: Family Medicine

## 2021-10-17 DIAGNOSIS — L97521 Non-pressure chronic ulcer of other part of left foot limited to breakdown of skin: Secondary | ICD-10-CM

## 2021-10-17 DIAGNOSIS — M2041 Other hammer toe(s) (acquired), right foot: Secondary | ICD-10-CM

## 2021-10-17 DIAGNOSIS — M2042 Other hammer toe(s) (acquired), left foot: Secondary | ICD-10-CM

## 2021-10-17 DIAGNOSIS — E11621 Type 2 diabetes mellitus with foot ulcer: Secondary | ICD-10-CM

## 2021-10-17 DIAGNOSIS — L97529 Non-pressure chronic ulcer of other part of left foot with unspecified severity: Secondary | ICD-10-CM

## 2021-10-17 NOTE — Progress Notes (Signed)
SITUATION Reason for Consult: Follow-up with diabetic insole Patient / Caregiver Report: Patient has developed a wound on his left 2nd toe tip  OBJECTIVE DATA History / Diagnosis: Type 2 diabetes mellitus with left diabetic foot ulcer ([place])  Toe ulcer, left, limited to breakdown of skin (HCC)  Hammertoes of both feet  Change in Pathology: Ulceration of 2nd hammer toe  ACTIONS PERFORMED Patient's equipment was checked for structural stability and fit. Increased offloading to hammertoe tips and added toe wedge. Device(s) intact and fit is excellent. All questions answered and concerns addressed.  PLAN Follow-up as needed (PRN). Plan of care discussed with and agreed upon by patient / caregiver.

## 2021-10-18 NOTE — Telephone Encounter (Signed)
Spoke with pharmacy and medication was sent out today. Pt's wife is aware

## 2021-10-19 ENCOUNTER — Ambulatory Visit: Payer: Medicare Other | Admitting: Dermatology

## 2021-10-20 NOTE — Progress Notes (Signed)
Subjective: [name] is a 75 y.o. is seen today for follow-up evaluation of a wound on the second toe.  He has been continue with Iodosorb dressing changes daily.  He thinks is making it better.  No swelling or redness that he reports.  No drainage.  Denies any fevers or chills.  The last A1c he reports was 7.1 his last morning blood sugar was 137.   Objective: General: No acute distress, AAOx3 -wife present Neurovascular status unchanged Left foot: Wound submetatarsal 1 is healed.  Hammertoe contracture present of the second digit resulting hyperkeratotic lesion the distal portion of the toe.  Upon debridement there is a granular wound noted measuring 0.5 x 0.2 x 0.1 cm.  Prior to wound debridement was covered with a callus that was not able to measure the wound.  There is no probing, undermining or tunneling.  There is no drainage or pus.  No fluctuation crepitation.  Slight edema and faint erythema of the distal portion without any ascending cellulitis. No pain with calf compression, swelling, warmth, erythema.   Assessment and Plan:  Ulceration left second toe  -Treatment options discussed including all alternatives, risks, and complications -Sharply debrided the hyperkeratotic tissue, the wound down to healthy, granular tissue without any complications on the second digit utilizing a #312 blade scalpel.  Minimal blood loss and hemostasis achieved for manual compression.  Tolerated well.  I irrigated the wound and I was was applied followed by dressing. -He is already had a flexor tenotomy which helped some.  Discussed if needed arthroplasty of the digit to help take pressure at the distal portion of the toe.  Encouraged him to stay off the foot as much as possible and wear surgical shoe.  He does continue to work he states he tries to sit at work but his wife also states that he is on his foot more than he should be. -Monitor for any clinical signs or symptoms of infection and directed to  call the office immediately should any occur or go to the ER.  [name]

## 2021-10-20 NOTE — Telephone Encounter (Signed)
Lvm on wife's phone to call back in with how her husband was doing being off norvasc

## 2021-10-24 ENCOUNTER — Encounter: Payer: Self-pay | Admitting: Family Medicine

## 2021-10-24 DIAGNOSIS — H401131 Primary open-angle glaucoma, bilateral, mild stage: Secondary | ICD-10-CM | POA: Diagnosis not present

## 2021-10-26 ENCOUNTER — Telehealth: Payer: Self-pay | Admitting: Family Medicine

## 2021-10-26 ENCOUNTER — Other Ambulatory Visit: Payer: Self-pay | Admitting: Family Medicine

## 2021-10-26 ENCOUNTER — Other Ambulatory Visit: Payer: Self-pay

## 2021-10-26 MED ORDER — PREGABALIN 150 MG PO CAPS: 150.0000 mg | ORAL_CAPSULE | Freq: Two times a day (BID) | ORAL | 1 refills | Status: DC

## 2021-10-26 NOTE — Telephone Encounter (Signed)
Medication: pregabalin (LYRICA) 150 MG capsule   Has the patient contacted their pharmacy? Yes.   (If no, request that the patient contact the pharmacy for the refill.) (If yes, when and what did the pharmacy advise?)  Preferred Pharmacy (with phone number or street name): [facility] [place]  [address] [place] [address]  Phone:  [phone]  Fax:  [phone]   Agent: Please be advised that RX refills may take up to 3 business days. We ask that you follow-up with your pharmacy.

## 2021-11-02 ENCOUNTER — Ambulatory Visit: Payer: Medicare Other | Admitting: Podiatry

## 2021-11-05 ENCOUNTER — Encounter: Payer: Self-pay | Admitting: Family Medicine

## 2021-11-05 ENCOUNTER — Other Ambulatory Visit: Payer: Self-pay | Admitting: Family Medicine

## 2021-11-06 MED ORDER — ACCU-CHEK GUIDE VI STRP: ORAL_STRIP | 1 refills | Status: DC

## 2021-11-10 ENCOUNTER — Encounter: Payer: Self-pay | Admitting: Internal Medicine

## 2021-11-14 ENCOUNTER — Other Ambulatory Visit: Payer: Self-pay

## 2021-11-14 ENCOUNTER — Ambulatory Visit (INDEPENDENT_AMBULATORY_CARE_PROVIDER_SITE_OTHER): Payer: Medicare Other | Admitting: Podiatry

## 2021-11-14 DIAGNOSIS — M2041 Other hammer toe(s) (acquired), right foot: Secondary | ICD-10-CM

## 2021-11-14 DIAGNOSIS — M2042 Other hammer toe(s) (acquired), left foot: Secondary | ICD-10-CM | POA: Diagnosis not present

## 2021-11-14 DIAGNOSIS — E11621 Type 2 diabetes mellitus with foot ulcer: Secondary | ICD-10-CM | POA: Diagnosis not present

## 2021-11-14 DIAGNOSIS — L97521 Non-pressure chronic ulcer of other part of left foot limited to breakdown of skin: Secondary | ICD-10-CM | POA: Diagnosis not present

## 2021-11-14 DIAGNOSIS — L97529 Non-pressure chronic ulcer of other part of left foot with unspecified severity: Secondary | ICD-10-CM

## 2021-11-15 ENCOUNTER — Other Ambulatory Visit: Payer: Self-pay

## 2021-11-15 MED ORDER — PREGABALIN 150 MG PO CAPS: 150.0000 mg | ORAL_CAPSULE | Freq: Two times a day (BID) | ORAL | 1 refills | Status: DC

## 2021-11-15 NOTE — Telephone Encounter (Signed)
Pt would like only this medication sent to [facility]

## 2021-11-15 NOTE — Progress Notes (Signed)
error 

## 2021-11-16 ENCOUNTER — Other Ambulatory Visit: Payer: Self-pay

## 2021-11-16 ENCOUNTER — Ambulatory Visit: Payer: Medicare Other

## 2021-11-16 DIAGNOSIS — E11621 Type 2 diabetes mellitus with foot ulcer: Secondary | ICD-10-CM

## 2021-11-16 DIAGNOSIS — M2041 Other hammer toe(s) (acquired), right foot: Secondary | ICD-10-CM

## 2021-11-16 DIAGNOSIS — L97522 Non-pressure chronic ulcer of other part of left foot with fat layer exposed: Secondary | ICD-10-CM

## 2021-11-16 DIAGNOSIS — M2042 Other hammer toe(s) (acquired), left foot: Secondary | ICD-10-CM | POA: Diagnosis not present

## 2021-11-16 NOTE — Progress Notes (Signed)
SITUATION Reason for Visit: Fitting of Diabetic Shoes & Insoles Patient / Caregiver Report:  Patient is satisfied with fit and function  OBJECTIVE DATA: Patient History / Diagnosis:     ICD-10-CM   1. Type 2 diabetes mellitus with left diabetic foot ulcer ([place])  E11.621    L97.529     2. Hammertoes of both feet  M20.41    M20.42     3. Toe ulcer, left, with fat layer exposed ([place])  L97.522       Change in Status:   None  ACTIONS PERFORMED: In-Person Delivery, patient was fit with: - 1x pair A5500 PDAC approved prefabricated Diabetic Shoes: Apex leather shoes - 3x pair [id number] PDAC approved vacuum formed custom diabetic insoles  Shoes and insoles were verified for structural integrity and safety. Patient wore shoes and insoles in office. Skin was inspected and free of areas of concern after wearing shoes and inserts. Shoes and inserts fit properly. Patient / Caregiver provided with ferbal instruction and demonstration regarding donning, doffing, wear, care, proper fit, function, purpose, cleaning, and use of shoes and insoles ' and in all related precautions and risks and benefits regarding shoes and insoles. Patient / Caregiver was instructed to wear properly fitting socks with shoes at all times. Patient was also provided with verbal instruction regarding how to report any failures or malfunctions of shoes or inserts, and necessary follow up care. Patient / Caregiver was also instructed to contact physician regarding change in status that may affect function of shoes and inserts.   Patient / Caregiver verbalized undersatnding of instruction provided. Patient / Caregiver demonstrated independence with proper donning and doffing of shoes and inserts.  PLAN Patient to follow up as needed. Plan of care was discussed with and agreed upon by patient and/or caregiver. All questions were answered and concerns addressed.

## 2021-11-17 DIAGNOSIS — H401131 Primary open-angle glaucoma, bilateral, mild stage: Secondary | ICD-10-CM | POA: Diagnosis not present

## 2021-11-19 NOTE — Progress Notes (Signed)
Subjective: [name] is a 76 y.o. is seen today for follow-up evaluation of a wound on the second toe.  He presents today with his wife.  He states he has been doing well.  No significant drainage or any purulence.  No increased swelling or redness or any red streaks.  No fevers or chills.  He has no other concerns today.   Last A1c was 7.1 on September 07, 2021  Objective: General: No acute distress, AAOx3 -wife present Neurovascular status unchanged Left foot: Wound submetatarsal 1 is healed.  Hammertoe contractures present.  The distal aspect of the left second toe was a thick hyperkeratotic lesion with dried blood.  Upon debridement there was a skin fissure type wound within the skin has gotten very dry and callused.  There is no probing, undermining or tunneling.  Minimal edema.  No fluctuation or crepitation.  No ascending cellulitis.  No probing to bone, undermining or tunneling today.  No pain with calf compression, swelling, warmth, erythema.   Assessment and Plan:  Ulceration left second toe, hammertoe  -Treatment options discussed including all alternatives, risks, and complications -Sharply debrided the hyperkeratotic tissue, the wound down to healthy, granular tissue without any complications on the second digit utilizing a #312 blade scalpel.  There was no blood loss.  Tolerated well.  I irrigated the wound and I was was applied followed by dressing.  Continue with dressing changes daily.  Continue offloading. -He is already had a flexor tenotomy which helped some.  Discussed if needed arthroplasty of the digit to help take pressure at the distal portion of the toe.  Encouraged him to stay off the foot as much as possible and wear surgical shoe.  He does continue to work he states he tries to sit at work but his wife also states that he is on his foot more than he should be.  Consider arthroplasty of the toe -Monitor for any clinical signs or symptoms of infection and directed to  call the office immediately should any occur or go to the ER.  [name]

## 2021-11-27 ENCOUNTER — Other Ambulatory Visit: Payer: Self-pay | Admitting: Family Medicine

## 2021-11-28 ENCOUNTER — Encounter: Payer: Self-pay | Admitting: Family Medicine

## 2021-11-28 ENCOUNTER — Other Ambulatory Visit: Payer: Self-pay | Admitting: Family Medicine

## 2021-11-28 MED ORDER — LEVOTHYROXINE SODIUM 25 MCG PO TABS: 25.0000 ug | ORAL_TABLET | Freq: Every day | ORAL | 1 refills | Status: DC

## 2021-12-05 ENCOUNTER — Other Ambulatory Visit: Payer: Self-pay

## 2021-12-05 ENCOUNTER — Ambulatory Visit (INDEPENDENT_AMBULATORY_CARE_PROVIDER_SITE_OTHER): Payer: Medicare Other

## 2021-12-05 ENCOUNTER — Encounter: Payer: Self-pay | Admitting: Podiatry

## 2021-12-05 ENCOUNTER — Ambulatory Visit (INDEPENDENT_AMBULATORY_CARE_PROVIDER_SITE_OTHER): Payer: Medicare Other | Admitting: Podiatry

## 2021-12-05 DIAGNOSIS — G4733 Obstructive sleep apnea (adult) (pediatric): Secondary | ICD-10-CM

## 2021-12-05 DIAGNOSIS — M2041 Other hammer toe(s) (acquired), right foot: Secondary | ICD-10-CM | POA: Diagnosis not present

## 2021-12-05 DIAGNOSIS — M2042 Other hammer toe(s) (acquired), left foot: Secondary | ICD-10-CM | POA: Diagnosis not present

## 2021-12-05 DIAGNOSIS — L97521 Non-pressure chronic ulcer of other part of left foot limited to breakdown of skin: Secondary | ICD-10-CM

## 2021-12-06 DIAGNOSIS — G4733 Obstructive sleep apnea (adult) (pediatric): Secondary | ICD-10-CM | POA: Diagnosis not present

## 2021-12-12 NOTE — Progress Notes (Signed)
Subjective: [name] is a 76 y.o. is seen today for follow-up evaluation of a wound on the second toe.  He has not noticed any significant drainage or pus coming from the toe no increase in swelling or redness.  His wife states that he still does not stay off the foot and around the house he does not wear shoe.  Denies any fevers or chills.    Last A1c was 7.1 on September 07, 2021  Objective: General: No acute distress, AAOx3 -wife present Neurovascular status unchanged Left foot: Heavyset lesion with dried blood present distal aspect of the second toe on the left foot.  Upon debridement there is mild central area of superficial granular tissue without any probing, undermining or tunneling.  There is no erythema to the digit or ascending cellulitis.  There is no fluctuation or crepitation.  There is no malodor. Hammertoes present No pain with calf compression, swelling, warmth, erythema.   Assessment and Plan:  Ulceration left second toe, hammertoe  -Treatment options discussed including all alternatives, risks, and complications -Sharply debrided the hyperkeratotic tissue with a #312 with scalpel down to a small superficial pinpoint granular wound.  No obvious signs of infection noted today.  Recommend continue with daily dressing changes and strongly encouraged offloading at all times. -He is already had a flexor tenotomy which helped some.  Discussed if needed arthroplasty of the digit to help take pressure at the distal portion of the toe.  Encouraged him to stay off the foot as much as possible and wear surgical shoe.  He does continue to work he states he tries to sit at work but his wife also states that he is on his foot more than he should be.  Consider arthroplasty of the toe -Monitor for any clinical signs or symptoms of infection and directed to call the office immediately should any occur or go to the ER.  *x-ray next appointment  [name]

## 2021-12-14 DIAGNOSIS — Z20822 Contact with and (suspected) exposure to covid-19: Secondary | ICD-10-CM | POA: Diagnosis not present

## 2021-12-14 DIAGNOSIS — Z03818 Encounter for observation for suspected exposure to other biological agents ruled out: Secondary | ICD-10-CM | POA: Diagnosis not present

## 2021-12-15 ENCOUNTER — Encounter: Payer: Self-pay | Admitting: Internal Medicine

## 2021-12-15 ENCOUNTER — Ambulatory Visit (INDEPENDENT_AMBULATORY_CARE_PROVIDER_SITE_OTHER): Payer: Medicare Other | Admitting: Internal Medicine

## 2021-12-15 ENCOUNTER — Telehealth: Payer: Self-pay | Admitting: Internal Medicine

## 2021-12-15 ENCOUNTER — Other Ambulatory Visit: Payer: Self-pay

## 2021-12-15 VITALS — BP 162/80 | HR 71 | Temp 98.1°F | Ht 69.0 in | Wt 247.2 lb

## 2021-12-15 DIAGNOSIS — G4733 Obstructive sleep apnea (adult) (pediatric): Secondary | ICD-10-CM | POA: Diagnosis not present

## 2021-12-15 DIAGNOSIS — J449 Chronic obstructive pulmonary disease, unspecified: Secondary | ICD-10-CM | POA: Diagnosis not present

## 2021-12-15 MED ORDER — BREZTRI AEROSPHERE 160-9-4.8 MCG/ACT IN AERO: 2.0000 | INHALATION_SPRAY | Freq: Two times a day (BID) | RESPIRATORY_TRACT | 5 refills | Status: DC

## 2021-12-15 NOTE — Telephone Encounter (Signed)
Dr. Desai please advise.  
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
## 2021-12-15 NOTE — Patient Instructions (Addendum)
Please schedule follow up scheduled with myself in 6 months.  If my schedule is not open yet, we will contact you with a reminder closer to that time. Please call [phone] if you haven't heard from [name] a month before.   Before your next visit I would like you to have: I have referred you to pulmonary rehab - they will call to schedule.   Start taking breztri inhaler 2 puffs twice a day, gargle after use.  Continue albuterol as needed.   Stop all other inhalers.

## 2021-12-15 NOTE — Progress Notes (Signed)
PFT done today. 

## 2021-12-15 NOTE — Progress Notes (Signed)
The patient has been prescribed the inhaler Breztri. Inhaler technique was demonstrated to patient. The patient subsequently demonstrated correct technique. ° °

## 2021-12-16 NOTE — Telephone Encounter (Signed)
Please do benefits inquire for breztri, or any LAMA-LABA combination. Ok to go ahead and prescribe if there is a cheaper alternative.

## 2021-12-17 ENCOUNTER — Other Ambulatory Visit: Payer: Self-pay | Admitting: Family Medicine

## 2021-12-17 ENCOUNTER — Encounter: Payer: Self-pay | Admitting: Internal Medicine

## 2021-12-17 LAB — PULMONARY FUNCTION TEST
DL/VA % pred: 69 %
DL/VA: 2.8 ml/min/mmHg/L
DLCO cor % pred: 57 %
DLCO cor: 14.16 ml/min/mmHg
DLCO unc % pred: 57 %
DLCO unc: 14.16 ml/min/mmHg
FEF 25-75 Post: 0.67 L/sec
FEF 25-75 Pre: 0.65 L/sec
FEF2575-%Change-Post: 2 %
FEF2575-%Pred-Post: 30 %
FEF2575-%Pred-Pre: 29 %
FEV1-%Change-Post: 2 %
FEV1-%Pred-Post: 55 %
FEV1-%Pred-Pre: 54 %
FEV1-Post: 1.66 L
FEV1-Pre: 1.62 L
FEV1FVC-%Change-Post: 2 %
FEV1FVC-%Pred-Pre: 72 %
FEV6-%Change-Post: 0 %
FEV6-%Pred-Post: 76 %
FEV6-%Pred-Pre: 76 %
FEV6-Post: 2.97 L
FEV6-Pre: 2.94 L
FEV6FVC-%Change-Post: 0 %
FEV6FVC-%Pred-Post: 102 %
FEV6FVC-%Pred-Pre: 101 %
FVC-%Change-Post: 0 %
FVC-%Pred-Post: 75 %
FVC-%Pred-Pre: 75 %
FVC-Post: 3.08 L
FVC-Pre: 3.08 L
Post FEV1/FVC ratio: 54 %
Post FEV6/FVC ratio: 96 %
Pre FEV1/FVC ratio: 53 %
Pre FEV6/FVC Ratio: 95 %
RV % pred: 109 %
RV: 2.72 L
TLC % pred: 89 %
TLC: 6.13 L

## 2021-12-18 ENCOUNTER — Other Ambulatory Visit (HOSPITAL_COMMUNITY): Payer: Self-pay

## 2021-12-18 NOTE — Telephone Encounter (Signed)
See pt's email. They cannot afford this because of their high deductible. Can patient assistance help with this situation?

## 2021-12-18 NOTE — Telephone Encounter (Signed)
Mychart message sent by pt's spouse: [name] (proxy for [name])  P [facility] (supporting Spero Geralds, MD) 18 hours ago (9:15 PM)   This message is being sent by [name] on behalf of [name].   Hi Dr. Shearon Stalls this is [name], [name].  We went to pick up the new inhaler but it's $300. We just don't have the money for this. Is there anything cheaper he can take? That was after insurance, this was his co-pay.  We know he needs to be on an inhaler for his COPD but these are just so expensive.     Routing to both Dr. Shearon Stalls and prior Josem Kaufmann team for advise.

## 2021-12-18 NOTE — Telephone Encounter (Signed)
Please let patient know he has a pharmacy deductible for his insurance. Everything will be expensive until he meets that.

## 2021-12-18 NOTE — Telephone Encounter (Signed)
Attempted to call pt's spouse but unable to reach. Left message for them to return call. Pt's spouse had also sent mychart encounter about the same thing so the response from phone encounter was posted in that encounter. Nothing further needed.

## 2021-12-18 NOTE — Telephone Encounter (Signed)
Pt's spouse had also called the office in regards to this too so I have posted the responses from the phone encounter into the mychart encounter for pt and spouse to view. Nothing further needed.

## 2021-12-19 NOTE — Telephone Encounter (Signed)
At this time patient assistance request would need to be completed in the office. Prior [employer] does not handle patient assistance.

## 2021-12-25 ENCOUNTER — Encounter (HOSPITAL_COMMUNITY): Payer: Self-pay | Admitting: *Deleted

## 2021-12-25 NOTE — Progress Notes (Signed)
Received referral from Dr. Shearon Stalls for this pt to participate in pulmonary rehab with the diagnosis of COPD Stage 2 per Gold Standard.  Pt with full PFT from 12/15/21 which showed FEV1/FVC 54 and FEV1 post BD predicted 55. . Clinical review of pt follow up appt on 2/10 Pulmonary office note.  Pt with Covid Risk Score - 8. Pt appropriate for scheduling for Pulmonary rehab when okay per Podiatry. Pt currently treated for ulceration of Left 2nd toe hammertoe.  Reviewed office visit from 1/31 with encouragement to staff off the foot as much as possible and wear surgical shoe.  This will effect his ability to participate in group exercise.  Pt next follow up with podiatry is on 12/26/21. Will forward to support staff for scheduling and verification of insurance eligibility/benefits with pt consent. Cherre Huger, BSN Cardiac and Training and development officer

## 2021-12-26 ENCOUNTER — Ambulatory Visit (INDEPENDENT_AMBULATORY_CARE_PROVIDER_SITE_OTHER): Payer: Medicare Other

## 2021-12-26 ENCOUNTER — Other Ambulatory Visit: Payer: Self-pay

## 2021-12-26 ENCOUNTER — Ambulatory Visit: Payer: Medicare Other

## 2021-12-26 ENCOUNTER — Ambulatory Visit: Payer: Medicare Other | Admitting: Podiatry

## 2021-12-26 DIAGNOSIS — M2041 Other hammer toe(s) (acquired), right foot: Secondary | ICD-10-CM | POA: Diagnosis not present

## 2021-12-26 DIAGNOSIS — E11621 Type 2 diabetes mellitus with foot ulcer: Secondary | ICD-10-CM

## 2021-12-26 DIAGNOSIS — L97521 Non-pressure chronic ulcer of other part of left foot limited to breakdown of skin: Secondary | ICD-10-CM

## 2021-12-26 DIAGNOSIS — M2042 Other hammer toe(s) (acquired), left foot: Secondary | ICD-10-CM | POA: Diagnosis not present

## 2021-12-26 DIAGNOSIS — L97529 Non-pressure chronic ulcer of other part of left foot with unspecified severity: Secondary | ICD-10-CM | POA: Diagnosis not present

## 2021-12-26 NOTE — Progress Notes (Signed)
SITUATION Reason for Consult: Follow-up with left custom insole Patient / Caregiver Report: Patient needs additional offloading per [place]  OBJECTIVE DATA History / Diagnosis:    ICD-10-CM   1. Type 2 diabetes mellitus with left diabetic foot ulcer ([place])  E11.621    L97.529     2. Hammertoes of both feet  M20.41    M20.42       Change in Pathology: None  ACTIONS PERFORMED Patient's equipment was checked for structural stability and fit. Offloaded hammertoes as appropriate Device(s) intact and fit is excellent. All questions answered and concerns addressed.  PLAN Follow-up as needed (PRN). Plan of care discussed with and agreed upon by patient / caregiver.

## 2021-12-29 MED ORDER — CEPHALEXIN 500 MG PO CAPS: 500.0000 mg | ORAL_CAPSULE | Freq: Three times a day (TID) | ORAL | 0 refills | Status: DC

## 2021-12-30 NOTE — Progress Notes (Signed)
Subjective: [name] is a 76 y.o. is seen today for follow-up evaluation of a wound on the second toe.  His wife noticed previously there was some swelling, redness and drainage but that since has resolved. Overall he believes the wound is doing better.  They have been applying Iodosorb.  Denies any fevers or chills.  No other concerns.  Last A1c was 7.1 on September 07, 2021  Objective: General: No acute distress, AAOx3 -wife present Neurovascular status unchanged Left foot: Heavyset lesion with dried blood present distal aspect of the second toe on the left foot.  Upon debridement there is a superficial granular wound noted without any probing, undermining or tunneling.  Localized edema erythema distal portion of the toe but there is no ascending cellulitis.  No fluctuation or crepitation but there is no malodor.  Submetatarsal 1 is a small hyperkeratotic tissue with some dried blood but there is no underlying ulceration noted today.  No other open lesions.  Hammertoes are present. No pain with calf compression, swelling, warmth, erythema.   Assessment and Plan:  Ulceration left second toe, hammertoe; preulcerative submetatarsal 1  -Treatment options discussed including all alternatives, risks, and complications -X-rays obtained reviewed.  Chronic arthritic changes present.  No definitive evidence of acute fracture, osteomyelitis. -Sharply debrided the hyperkeratotic tissue with a #312 with scalpel down to a small superficial granular wound.  No blood loss.  I cleansed the wound with saline.  Tolerated the procedure well.  Recommend continue with daily dressing changes with Iodosorb and strongly encouraged offloading at all times. -Lightly debrided hyperkeratotic tissue without any complications or bleeding submetatarsal 1. -I discussed arthroplasty of the digit today but unfortunately is not able to do this because of his work schedule.  This is also contributing to reoccurrence of the wound.   He does try to sit at work when he is on his feet still. -Keflex  -Monitor for any clinical signs or symptoms of infection and directed to call the office immediately should any occur or go to the ER.  Return in about 1 week (around 01/02/2022).  [name]

## 2022-01-02 ENCOUNTER — Other Ambulatory Visit: Payer: Self-pay

## 2022-01-02 ENCOUNTER — Ambulatory Visit: Payer: Medicare Other | Admitting: Podiatry

## 2022-01-02 DIAGNOSIS — L97529 Non-pressure chronic ulcer of other part of left foot with unspecified severity: Secondary | ICD-10-CM

## 2022-01-02 DIAGNOSIS — E11621 Type 2 diabetes mellitus with foot ulcer: Secondary | ICD-10-CM | POA: Diagnosis not present

## 2022-01-02 DIAGNOSIS — L84 Corns and callosities: Secondary | ICD-10-CM | POA: Diagnosis not present

## 2022-01-02 DIAGNOSIS — E1142 Type 2 diabetes mellitus with diabetic polyneuropathy: Secondary | ICD-10-CM | POA: Diagnosis not present

## 2022-01-02 DIAGNOSIS — L97521 Non-pressure chronic ulcer of other part of left foot limited to breakdown of skin: Secondary | ICD-10-CM

## 2022-01-03 ENCOUNTER — Other Ambulatory Visit: Payer: Self-pay | Admitting: Dermatology

## 2022-01-03 ENCOUNTER — Ambulatory Visit: Payer: Medicare Other | Admitting: Dermatology

## 2022-01-03 ENCOUNTER — Encounter: Payer: Self-pay | Admitting: Dermatology

## 2022-01-03 ENCOUNTER — Other Ambulatory Visit: Payer: Self-pay

## 2022-01-03 DIAGNOSIS — Z1283 Encounter for screening for malignant neoplasm of skin: Secondary | ICD-10-CM

## 2022-01-03 DIAGNOSIS — L400 Psoriasis vulgaris: Secondary | ICD-10-CM

## 2022-01-03 DIAGNOSIS — R5383 Other fatigue: Secondary | ICD-10-CM | POA: Diagnosis not present

## 2022-01-03 DIAGNOSIS — L821 Other seborrheic keratosis: Secondary | ICD-10-CM | POA: Diagnosis not present

## 2022-01-03 MED ORDER — CLOBETASOL PROPIONATE 0.05 % EX FOAM: Freq: Two times a day (BID) | CUTANEOUS | 6 refills | Status: DC

## 2022-01-03 MED ORDER — SKYRIZI PEN 150 MG/ML ~~LOC~~ SOAJ: 150.0000 mg | SUBCUTANEOUS | 1 refills | Status: DC

## 2022-01-03 NOTE — Telephone Encounter (Signed)
Refill ok good rx coupon given ? ?

## 2022-01-03 NOTE — Patient Instructions (Signed)
Phone call in a month to let us know how the clobetasol foam is doing.  ?

## 2022-01-03 NOTE — Progress Notes (Signed)
Subjective: [name] is a 76 y.o. is seen today for follow-up evaluation of a wound on the second toe.  He states he has been doing well.  His wife continue without his oral dressing changes.  Some antibiotics.  No drainage or pus noted.  Denies any fevers or chills.  He has no other concerns.    Last A1c was 7.1 on September 07, 2021  Objective: General: No acute distress, AAOx3 -wife present Neurovascular status unchanged Left foot: Hyperkeratotic lesion noted at the distal aspect of the left second toe.  Upon debridement there is a still small superficial granular wound noted but appears to be improved.  There is minimal edema and faint erythema but this appears to be improved compared to last appointment.  There is no drainage or pus.  No probing, undermining or tunneling.  Preulcerative lesion submetatarsal 1 as well as medial hallux on the right side.  There is no underlying ulceration drainage or signs of infection.  No pain with calf compression, swelling, warmth, erythema.   Assessment and Plan:  Ulceration left second toe, hammertoe; preulcerative submetatarsal 1  -Treatment options discussed including all alternatives, risks, and complications -Sharply debrided the hyperkeratotic lesion, wound on the left second toe without any complications utilizing a #312 with scalpel.  Also debrided the preulcerative lesions to any complications.  Continue as well dressing changes.  Continue offloading at all times.  -We previously discussed arthroplasty but unfortunately due to his work schedule he does not want to proceed with that at this time. Elizebeth Koller course of antibiotics.  [name]

## 2022-01-03 NOTE — Telephone Encounter (Signed)
Paperwork received via mail, placed in Dr. Mauricio Po box. ?

## 2022-01-03 NOTE — Telephone Encounter (Signed)
Good rx coupon under 50$ ?

## 2022-01-04 ENCOUNTER — Other Ambulatory Visit: Payer: Self-pay | Admitting: Dermatology

## 2022-01-04 ENCOUNTER — Other Ambulatory Visit: Payer: Self-pay | Admitting: *Deleted

## 2022-01-04 DIAGNOSIS — L400 Psoriasis vulgaris: Secondary | ICD-10-CM

## 2022-01-04 MED ORDER — SKYRIZI PEN 150 MG/ML ~~LOC~~ SOAJ: 150.0000 mg | SUBCUTANEOUS | 6 refills | Status: DC

## 2022-01-07 LAB — QUANTIFERON-TB GOLD PLUS
Mitogen-NIL: >10 IU/mL
NIL: 0.03 IU/mL
QuantiFERON-TB Gold Plus: NEGATIVE
TB1-NIL: 0.01 IU/mL
TB2-NIL: 0.01 IU/mL

## 2022-01-08 ENCOUNTER — Other Ambulatory Visit: Payer: Self-pay | Admitting: Dermatology

## 2022-01-08 DIAGNOSIS — L400 Psoriasis vulgaris: Secondary | ICD-10-CM

## 2022-01-13 ENCOUNTER — Encounter: Payer: Self-pay | Admitting: Dermatology

## 2022-01-13 NOTE — Progress Notes (Signed)
? ?  Follow-Up Visit ?  ?Subjective  ?[name] is a 76 y.o. male who presents for the following: Follow-up (F/u for psoriasis ). ? ?Skin check, follow-up psoriasis on Skyrizi ?Location:  ?Duration:  ?Quality:  ?Associated Signs/Symptoms: ?Modifying Factors:  ?Severity:  ?Timing: ?Context:  ? ?Objective  ?Well appearing patient in no apparent distress; mood and affect are within normal limits. ?No atypical pigmented spots or skin cancer waist ? ?Mid Frontal Scalp ?On skyrizi: Skin generally 90+ percent clear.  Some scale on scalp.  No joint inflammation. ? ?Left Temple ?Brown 8 mm flattopped textured papule, compatible dermoscopy ? ? ? ?All skin waist up examined. ? ? ?Assessment & Plan  ? ? ?Psoriasis vulgaris ?Mid Frontal Scalp ? ?Continue skyrizi, may use clobetasol foam on scalp for 2 to 4 weeks but avoid use on face.  Update TB testing ? ?QuantiFERON-TB Gold Plus - Mid Frontal Scalp ? ?clobetasol (OLUX) 0.05 % topical foam - Mid Frontal Scalp ?Apply topically 2 (two) times daily. ? ?Risankizumab-rzaa (SKYRIZI PEN) 150 MG/ML SOAJ - Mid Frontal Scalp ?Inject 150 mg into the skin as directed. At weeks 0 & 4. ? ?Encounter for screening for malignant neoplasm of skin ? ?Annual skin examination ? ?Related Procedures ?QuantiFERON-TB Gold Plus ? ?Other fatigue ? ?Related Procedures ?QuantiFERON-TB Gold Plus ? ?Seborrheic keratosis ?Left Temple ? ?Leave if stable ? ? ? ? ? ?I, Lavonna Monarch, MD, have reviewed all documentation for this visit.  The documentation on 01/13/22 for the exam, diagnosis, procedures, and orders are all accurate and complete. ?

## 2022-01-14 ENCOUNTER — Other Ambulatory Visit: Payer: Self-pay | Admitting: Family Medicine

## 2022-01-15 ENCOUNTER — Telehealth: Payer: Self-pay | Admitting: *Deleted

## 2022-01-15 ENCOUNTER — Other Ambulatory Visit: Payer: Self-pay | Admitting: Family Medicine

## 2022-01-15 MED ORDER — ALPRAZOLAM 0.25 MG PO TABS: 0.2500 mg | ORAL_TABLET | Freq: Two times a day (BID) | ORAL | 0 refills | Status: DC | PRN

## 2022-01-15 NOTE — Telephone Encounter (Signed)
Requesting: alprazolam 0.'25mg'$   ?Contract:  12/06/2016 ?UDS: 05/18/2021 ?Last Visit: 10/03/2021 w/ Percell Miller ?Next Visit: 01/30/2022 ?Last Refill: 09/05/2021 #20 and 2RF ? ?Please Advise ? ?

## 2022-01-15 NOTE — Telephone Encounter (Signed)
Patient's wife is calling to request another tube of Jess Barters, said that the doctor would know what this was. She wanted to be able to pick up a tube today.Please advise. ?

## 2022-01-16 ENCOUNTER — Other Ambulatory Visit (INDEPENDENT_AMBULATORY_CARE_PROVIDER_SITE_OTHER): Payer: Self-pay | Admitting: Adult Health

## 2022-01-16 DIAGNOSIS — I152 Hypertension secondary to endocrine disorders: Secondary | ICD-10-CM

## 2022-01-17 ENCOUNTER — Telehealth: Payer: Self-pay

## 2022-01-17 NOTE — Telephone Encounter (Signed)
Sample tube has been left at front desk for patient pick up.  Patient's wife has been notified ?

## 2022-01-17 NOTE — Telephone Encounter (Signed)
Patient's wife called stated that patient was given a sample of Iodosorb cream at previous visit and he is almost out.  Wanted to know if she could stop by and pick up another sample   please advise ?

## 2022-01-23 ENCOUNTER — Other Ambulatory Visit: Payer: Self-pay

## 2022-01-23 ENCOUNTER — Ambulatory Visit: Payer: Medicare Other | Admitting: Podiatry

## 2022-01-23 DIAGNOSIS — M2042 Other hammer toe(s) (acquired), left foot: Secondary | ICD-10-CM | POA: Diagnosis not present

## 2022-01-23 DIAGNOSIS — L97521 Non-pressure chronic ulcer of other part of left foot limited to breakdown of skin: Secondary | ICD-10-CM

## 2022-01-23 MED ORDER — AMOXICILLIN-POT CLAVULANATE 875-125 MG PO TABS: 1.0000 | ORAL_TABLET | Freq: Two times a day (BID) | ORAL | 0 refills | Status: DC

## 2022-01-29 ENCOUNTER — Other Ambulatory Visit: Payer: Self-pay | Admitting: *Deleted

## 2022-01-29 DIAGNOSIS — L400 Psoriasis vulgaris: Secondary | ICD-10-CM

## 2022-01-29 MED ORDER — CLOBETASOL PROPIONATE 0.05 % EX FOAM: CUTANEOUS | 6 refills | Status: DC
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
## 2022-01-30 ENCOUNTER — Telehealth (INDEPENDENT_AMBULATORY_CARE_PROVIDER_SITE_OTHER): Payer: Self-pay | Admitting: Adult Health

## 2022-01-30 ENCOUNTER — Encounter: Payer: Self-pay | Admitting: Family Medicine

## 2022-01-30 ENCOUNTER — Ambulatory Visit: Payer: Medicare Other | Admitting: Family Medicine

## 2022-01-30 VITALS — BP 142/82 | HR 65 | Resp 20 | Ht 69.0 in | Wt 247.8 lb

## 2022-01-30 DIAGNOSIS — I152 Hypertension secondary to endocrine disorders: Secondary | ICD-10-CM

## 2022-01-30 DIAGNOSIS — Z79899 Other long term (current) drug therapy: Secondary | ICD-10-CM | POA: Diagnosis not present

## 2022-01-30 DIAGNOSIS — M1A9XX0 Chronic gout, unspecified, without tophus (tophi): Secondary | ICD-10-CM

## 2022-01-30 DIAGNOSIS — E1149 Type 2 diabetes mellitus with other diabetic neurological complication: Secondary | ICD-10-CM | POA: Diagnosis not present

## 2022-01-30 DIAGNOSIS — N289 Disorder of kidney and ureter, unspecified: Secondary | ICD-10-CM | POA: Diagnosis not present

## 2022-01-30 DIAGNOSIS — E1159 Type 2 diabetes mellitus with other circulatory complications: Secondary | ICD-10-CM

## 2022-01-30 DIAGNOSIS — F418 Other specified anxiety disorders: Secondary | ICD-10-CM

## 2022-01-30 DIAGNOSIS — E782 Mixed hyperlipidemia: Secondary | ICD-10-CM

## 2022-01-30 DIAGNOSIS — R351 Nocturia: Secondary | ICD-10-CM

## 2022-01-30 DIAGNOSIS — M549 Dorsalgia, unspecified: Secondary | ICD-10-CM

## 2022-01-30 DIAGNOSIS — E039 Hypothyroidism, unspecified: Secondary | ICD-10-CM | POA: Diagnosis not present

## 2022-01-30 LAB — MICROALBUMIN / CREATININE URINE RATIO
Creatinine,U: 81.1 mg/dL
Microalb Creat Ratio: 1.3 mg/g (ref 0.0–30.0)
Microalb, Ur: 1.1 mg/dL (ref 0.0–1.9)

## 2022-01-30 LAB — CBC WITH DIFFERENTIAL/PLATELET
Basophils Absolute: 0 10*3/uL (ref 0.0–0.1)
Basophils Relative: 0.5 % (ref 0.0–3.0)
Eosinophils Absolute: 0.4 10*3/uL (ref 0.0–0.7)
Eosinophils Relative: 5.1 % — ABNORMAL HIGH (ref 0.0–5.0)
HCT: 43.9 % (ref 39.0–52.0)
Hemoglobin: 14.7 g/dL (ref 13.0–17.0)
Lymphocytes Relative: 27.9 % (ref 12.0–46.0)
Lymphs Abs: 2 10*3/uL (ref 0.7–4.0)
MCHC: 33.6 g/dL (ref 30.0–36.0)
MCV: 96.1 fl (ref 78.0–100.0)
Monocytes Absolute: 0.6 10*3/uL (ref 0.1–1.0)
Monocytes Relative: 7.7 % (ref 3.0–12.0)
Neutro Abs: 4.3 10*3/uL (ref 1.4–7.7)
Neutrophils Relative %: 58.8 % (ref 43.0–77.0)
Platelets: 222 10*3/uL (ref 150.0–400.0)
RBC: 4.56 Mil/uL (ref 4.22–5.81)
RDW: 13.9 % (ref 11.5–15.5)
WBC: 7.4 10*3/uL (ref 4.0–10.5)

## 2022-01-30 LAB — COMPREHENSIVE METABOLIC PANEL
ALT: 46 U/L (ref 0–53)
AST: 31 U/L (ref 0–37)
Albumin: 4.3 g/dL (ref 3.5–5.2)
Alkaline Phosphatase: 80 U/L (ref 39–117)
BUN: 23 mg/dL (ref 6–23)
CO2: 28 mEq/L (ref 19–32)
Calcium: 9.2 mg/dL (ref 8.4–10.5)
Chloride: 101 mEq/L (ref 96–112)
Creatinine, Ser: 0.83 mg/dL (ref 0.40–1.50)
GFR: 86.25 mL/min (ref >60.00)
Glucose, Bld: 145 mg/dL — ABNORMAL HIGH (ref 70–99)
Potassium: 5 mEq/L (ref 3.5–5.1)
Sodium: 138 mEq/L (ref 135–145)
Total Bilirubin: 0.4 mg/dL (ref 0.2–1.2)
Total Protein: 6.8 g/dL (ref 6.0–8.3)

## 2022-01-30 LAB — LIPID PANEL
Cholesterol: 144 mg/dL (ref 0–200)
HDL: 30.3 mg/dL — ABNORMAL LOW (ref >39.00)
NonHDL: 113.45
Total CHOL/HDL Ratio: 5
Triglycerides: 225 mg/dL — ABNORMAL HIGH (ref 0.0–149.0)
VLDL: 45 mg/dL — ABNORMAL HIGH (ref 0.0–40.0)

## 2022-01-30 LAB — HEMOGLOBIN A1C: Hgb A1c MFr Bld: 7.9 % — ABNORMAL HIGH (ref 4.6–6.5)

## 2022-01-30 LAB — LDL CHOLESTEROL, DIRECT: Direct LDL: 94 mg/dL

## 2022-01-30 LAB — TSH: TSH: 1.88 u[IU]/mL (ref 0.35–5.50)

## 2022-01-30 NOTE — Progress Notes (Signed)
Subjective: ?[name] is a 76 y.o. is seen today for follow-up evaluation of a wound on the second toe.  States that there is a small mount of bleeding coming from the wound which she started yesterday.  No pus.  No present swelling or redness.  He denies any fevers or chills.  No nausea or vomiting.  No other concerns today.  ? ? ?Last A1c was 7.1 on September 07, 2021 ? ?Objective: ?General: No acute distress, AAOx3 -wife present ?Neurovascular status unchanged ?Left foot: Hyperkeratotic lesion noted at the distal aspect of the left second toe.  After debridement there is a superficial granular wound present.  Patient recently in March today.  0.3 x 0.3 x 0.2 cm without any probing, undermining or tunneling.  There is chronic mild edema present to the distal portion of toe.  No significant cellulitis.  No fluctuation or crepitation.  There is no malodor.  Hammertoes present which is semirigid. ?Preulcerative lesion submetatarsal 1 without any underlying ulceration drainage or signs of infection of the left foot. ?No pain with calf compression, swelling, warmth, erythema.  ? ?Assessment and Plan:  ?Ulceration left second toe, hammertoe; preulcerative submetatarsal 1 ? ?-Treatment options discussed including all alternatives, risks, and complications ?-Given ongoing nature of the hammertoe we discussed arthroplasty however he does not proceed at this time as he cannot take time off of work however still concerned that if we do not treat this that he is can end up with further amputation.  Open attempt another flexor tenotomy today.  We discussed risks of the procedure and he understands and wishes to proceed and consent was signed.  Skin was cleaned with alcohol and 3 cc lidocaine, Marcaine plain was infiltrated in a digital block fashion.  Once anesthetized I prepped the skin with Betadine and then a 18-gauge needle was introduced to the plantar aspect of the toe underlying the deformity and I transected the  extensor tendon.  There was found to be some improvement in the hammertoe.  I irrigated this and a bandage was applied. ?-Sharply debrided the ulceration on the left second toe without any complications utilizing a #312 with scalpel.  Prior to debridement there is difficult to measure the wound given the discomfort most with callus and after debridement the wound measurements are above.  Also debrided the preulcerative lesions to any complications.  Minimal bleeding occurred.  Hemostasis using manual compression.  Tolerated well.  Continue as well dressing changes.  Continue offloading at all times.  ?-We previously discussed arthroplasty but unfortunately due to his work schedule he does not want to proceed with that at this time. ?-Monitor for any clinical signs or symptoms of infection and directed to call the office immediately should any occur or go to the ER. ? ?[name] ? ?

## 2022-01-30 NOTE — Telephone Encounter (Signed)
Pt's wife [name] called for clarification on a medication listed in my chart. Pt's is confused about the my chart entry. Pt's wife would like a call back at [phone].  ?

## 2022-01-30 NOTE — Telephone Encounter (Signed)
Wife confused re refill on Bupropion. Advised no refill sent in by [name].

## 2022-01-30 NOTE — Patient Instructions (Signed)
Managing Chronic Back Pain ?Chronic back pain is back pain that lasts for 12 weeks or longer. It often affects the lower back. Back pain may feel like a muscle ache or a sharp, stabbing pain. It can be mild, moderate, or severe. ?If you have been diagnosed with chronic back pain, there are things you can do to manage your symptoms. You may have to try different things to see what works best for you. Your health care provider may also give you specific instructions. ?How to manage lifestyle changes ?Treating chronic back pain often starts with rest and pain relief, followed by exercises to restore movement and strength to your back (physical therapy). You may need surgery if other treatments do not help, or if your pain is caused by a condition or an injury. Follow your treatment plan as told by your health care provider. This may include: ?Relaxation techniques. ?Talk therapy or counseling with a mental health specialist. A form of talk therapy called cognitive behavioral therapy (CBT) can be especially helpful. This therapy helps you set goals and follow up on the changes that you make. ?Acupuncture or massage therapy. ?Local electrical stimulation. ?Injections. These deliver numbing or pain-relieving medicines into your spine or the area of pain. ?How to recognize changes in your chronic back pain ?Your condition may improve with treatment. However, back pain may not go away or may get worse over time. Watch your symptoms carefully and let your health care provider know if your symptoms get worse or do not improve. ?Your back pain may be getting worse if you have: ?Pain that begins to cause problems with posture. ?Pain that gets worse when you are sitting, standing, walking, bending, or lifting. ?Pain that affects you while you are active, or at rest, or both. ?Pain that eventually makes it hard to move around (limits mobility). ?Pain that occurs with fever, weight loss, or difficulty urinating. ?Pain that causes  numbness and tingling. ?How to use body mechanics and posture to help with pain ?Healthy body mechanics and good posture can help to relieve stress on your back. Body mechanics refers to the movements and positions of your body during your daily activities. Posture is part of body mechanics. Good posture means: ?Your spine is in its natural S-curve, or neutral, position. ?Your shoulders are pulled back slightly. ?Your head is not tipped forward. ?Follow these guidelines to improve your posture and body mechanics in your everyday activities. ?Standing ? ?When standing, keep your spine neutral and your feet about hip-width apart. Keep your knees slightly bent. Your ears, shoulders, and hips should line up. ?When you do a task in which you stand in one place for a long time, place one foot on a stable object that is 2-4 inches (5-10 cm) high, such as a footstool. This helps keep your spine neutral. ?Sitting ? ?When sitting, keep your spine neutral and your feet flat on the floor. Use a footrest, if necessary, and keep your thighs parallel to the floor. Avoid rounding your shoulders, and avoid tilting your head forward. ?When working at a desk or a computer, keep your desk at a height where your hands are slightly lower than your elbows. Slide your chair under your desk so you are close enough to maintain good posture. ?When working at a computer, place your monitor at a height where you are looking straight ahead and you do not have to tilt your head forward or downward to view the screen. ?Lifting ? ?Keep your feet at  least shoulder-width apart and tighten the muscles of your abdomen. ?Bend your knees and hips and keep your spine neutral. Be sure to lift using the strength of your legs, not your back. Do not lock your knees straight out. ?Always ask for help to lift heavy or awkward objects. ?Resting ? ?When lying down and resting, avoid positions that are most painful. ?If you have pain with activities such as  sitting, bending, stooping, or squatting, lie in a position in which your body does not bend very much. For example, avoid curling up on your side with your arms and knees near your chest (fetal position). ?If you have pain with activities such as standing for a long time or reaching with your arms, lie with your spine in a neutral position and bend your knees slightly. Try: ?Lying on your side with a pillow between your knees. ?Lying on your back with a pillow under your knees. ?Follow these instructions at home: ?Medicines ?Treatment may include over-the-counter or prescription medicines for pain and inflammation that are taken by mouth or applied to the skin. Another treatment may include muscle relaxants. Take over-the-counter and prescription medicines only as told by your health care provider. ?Ask your health care provider if the medicine prescribed to you: ?Requires you to avoid driving or using machinery. ?Can cause constipation. You may need to take these actions to prevent or treat constipation: ?Drink enough fluid to keep your urine pale yellow. ?Take over-the-counter or prescription medicines. ?Eat foods that are high in fiber, such as beans, whole grains, and fresh fruits and vegetables. ?Limit foods that are high in fat and processed sugars, such as fried or sweet foods. ?Lifestyle ?Do not use any products that contain nicotine or tobacco, such as cigarettes, e-cigarettes, and chewing tobacco. If you need help quitting, ask your health care provider. ?Eat a healthy diet that includes foods such as vegetables, fruits, fish, and lean meats. ?Work with your health care provider to achieve or maintain a healthy weight. ?General instructions ?Get regular exercise as told. Exercise improves flexibility and strength. ?If physical therapy was prescribed, do exercises as told by your health care provider. ?Use ice or heat therapy as told by your health care provider. ?Keep all follow-up visits as told by your  health care provider. This is important. ?Where can I get support? ?Consider joining a support group for people managing chronic back pain. Ask your health care provider about support groups in your area. You can also find online and in-person support groups through: ?The American Chronic Pain Association: theacpa.org ?Pain Connection Program: painconnection.org ?Contact a health care provider if: ?You have pain that is not relieved with rest or medicine. ?Your pain gets worse, or you have new pain. ?You have a fever. ?You have rapid weight loss. ?You have trouble doing your normal activities. ?Get help right away if: ?You have weakness or numbness in one or both of your legs or feet. ?You have trouble controlling your bladder or your bowels. ?You have severe back pain and have any of the following: ?Nausea or vomiting. ?Abdominal pain. ?Shortness of breath or you faint. ?Summary ?Chronic back pain is often treated with rest, pain relief, and physical therapy. ?Talk therapy, acupuncture, massage, and local electrical stimulation may help. ?Follow your treatment plan as told by your health care provider. ?Joining a support group may help you manage chronic back pain. ?This information is not intended to replace advice given to you by your health care provider. Make sure you  discuss any questions you have with your health care provider. ?Document Revised: 12/03/2019 Document Reviewed: 08/11/2019 ?Elsevier Patient Education ? [place]. ? ?

## 2022-01-31 NOTE — Assessment & Plan Note (Signed)
On Levothyroxine, continue to monitor 

## 2022-01-31 NOTE — Assessment & Plan Note (Signed)
hgba1c acceptable, minimize simple carbs. Increase exercise as tolerated. Continue current meds 

## 2022-01-31 NOTE — Assessment & Plan Note (Signed)
Encourage heart healthy diet such as MIND or DASH diet, increase exercise, avoid trans fats, simple carbohydrates and processed foods, consider a krill or fish or flaxseed oil cap daily. Tolerating Atorvastatin 

## 2022-01-31 NOTE — Assessment & Plan Note (Signed)
Doing well on current meds no changes 

## 2022-01-31 NOTE — Assessment & Plan Note (Signed)
Hydrate and monitor 

## 2022-01-31 NOTE — Assessment & Plan Note (Signed)
Most notably pain in shoulder girdle and neck. Encouraged moist heat and gentle stretching as tolerated. May try NSAIDs and prescription meds as directed and report if symptoms worsen or seek immediate care, report if worsens.  ?

## 2022-02-02 LAB — DRUG MONITORING, PANEL 8 WITH CONFIRMATION, URINE
6 Acetylmorphine: NEGATIVE ng/mL (ref <10)
Alcohol Metabolites: NEGATIVE ng/mL (ref <500)
Amphetamines: NEGATIVE ng/mL (ref <500)
Benzodiazepines: NEGATIVE ng/mL (ref <100)
Buprenorphine, Urine: NEGATIVE ng/mL (ref <5)
Cocaine Metabolite: NEGATIVE ng/mL (ref <150)
Codeine: NEGATIVE ng/mL (ref <50)
Creatinine: 80 mg/dL (ref >=20.0)
Hydrocodone: 413 ng/mL — ABNORMAL HIGH (ref <50)
Hydromorphone: 179 ng/mL — ABNORMAL HIGH (ref <50)
MDMA: NEGATIVE ng/mL (ref <500)
Marijuana Metabolite: NEGATIVE ng/mL (ref <20)
Morphine: NEGATIVE ng/mL (ref <50)
Norhydrocodone: 557 ng/mL — ABNORMAL HIGH (ref <50)
Opiates: POSITIVE ng/mL — AB (ref <100)
Oxidant: NEGATIVE ug/mL (ref <200)
Oxycodone: NEGATIVE ng/mL (ref <100)
pH: 6.6 (ref 4.5–9.0)

## 2022-02-02 LAB — DM TEMPLATE

## 2022-02-06 ENCOUNTER — Ambulatory Visit (INDEPENDENT_AMBULATORY_CARE_PROVIDER_SITE_OTHER): Payer: Medicare Other | Admitting: Podiatry

## 2022-02-06 DIAGNOSIS — L97521 Non-pressure chronic ulcer of other part of left foot limited to breakdown of skin: Secondary | ICD-10-CM

## 2022-02-06 DIAGNOSIS — L97529 Non-pressure chronic ulcer of other part of left foot with unspecified severity: Secondary | ICD-10-CM

## 2022-02-06 DIAGNOSIS — M2042 Other hammer toe(s) (acquired), left foot: Secondary | ICD-10-CM

## 2022-02-06 DIAGNOSIS — E11621 Type 2 diabetes mellitus with foot ulcer: Secondary | ICD-10-CM

## 2022-02-09 NOTE — Progress Notes (Signed)
Subjective: ?[name] is a 76 y.o. is seen today for follow-up evaluation of a wound on the second toe.  He states he is doing better.  Wife states that he is on his feet a lot.  The toe is more straight after undergoing a flexor tenotomy.  Denies any drainage or pus.  His wife continues daily dressing changes.  Denies any fevers or chills.  No other concerns today.   ? ?Last A1c was 7.1 on September 07, 2021 ? ?Objective: ?General: No acute distress, AAOx3 -wife present ?Neurovascular status unchanged ?Left foot: Hyperkeratotic lesion noted at the distal aspect of the left second toe.  Upon debridement there is only a small pinpoint opening still present.  There is mild edema but there is no significant erythema.  No drainage or pus.  No fluctuation or crepitation but there is no malodor.  Also preulcerative lesion noted on debridement of the first metatarsal plantarly.  No underlying ulceration noted at this time.  No drainage or pus. ?No pain with calf compression, swelling, warmth, erythema.  ? ?Assessment and Plan:  ?Ulceration left second toe, hammertoe; preulcerative submetatarsal 1 ? ?-Treatment options discussed including all alternatives, risks, and complications ?-Sharp debride the hyperkeratotic lesion on the left second toe.  Wound appears to be doing better.  Still some localized edema but there is no erythema or any signs of infection noted at this time.  Continue offloading.  The procedure site where the flexor tenotomy was performed is healed. ?-Sharply debrided the preulcerative lesion on the left without any complications or bleeding.  Continue moisturizer, offloading daily. ?-Monitor for any clinical signs or symptoms of infection and directed to call the office immediately should any occur or go to the ER. ? ?[name] ?

## 2022-02-12 ENCOUNTER — Telehealth: Payer: Self-pay | Admitting: Dermatology

## 2022-02-12 ENCOUNTER — Telehealth: Payer: Self-pay | Admitting: Internal Medicine

## 2022-02-12 NOTE — Telephone Encounter (Signed)
Called back and spoke with [name] and he is aware page 3 of the assistance form needs to be filled. Tax documents need to be sent also, this needs to be done every year. I faxed page 2 in to my abbvie assist [phone]  ?

## 2022-02-12 NOTE — Telephone Encounter (Signed)
Called patient's wife but she did not answer. Left message for her to call back tomorrow.  ?

## 2022-02-12 NOTE — Telephone Encounter (Signed)
Wife says we were trying to get him [employer] assistance. They still haven't heard anything yet ?

## 2022-02-13 ENCOUNTER — Other Ambulatory Visit (HOSPITAL_COMMUNITY): Payer: Self-pay

## 2022-02-13 NOTE — Telephone Encounter (Signed)
I saw previously that he had filled out paperwork for financial assistance. Did that get sent? If triple therapy with ICS-LAMA-LABA is not affordable, ,pharmacy can please do a BIV for LAMA-LABA therapy for him. Thanks.  ?

## 2022-02-13 NOTE — Telephone Encounter (Signed)
[name] wife is returning phone call. [name] phone number is [phone]. ?

## 2022-02-13 NOTE — Telephone Encounter (Signed)
[place] but she did not answer. Left message for her to call back.  ?

## 2022-02-13 NOTE — Telephone Encounter (Signed)
Triage please let him know about his pharmacy deductible.  ?

## 2022-02-13 NOTE — Telephone Encounter (Signed)
Spoke with [employer]. She is aware of the deductible. She stated that they did fill patient assistance forms last month and sent them back to the office last month. So far they have not heard from [place] [employer]. I advised her that I would call to check on the application.  ? ?Called [place] [employer] [place] and spoke with an rep. She was not able to find the patient in their system. I found a copy of the application in Dr. Mauricio Po papers. Will refax application and check on status in the morning.  ? ? ?

## 2022-02-13 NOTE — Telephone Encounter (Signed)
Does he just have a pharmacy deductible that he needs to meet and then they'll be more affordable? ? ?

## 2022-02-13 NOTE — Telephone Encounter (Signed)
Called patient's wife but the call went straight to VM. Left message for her to call back.  ? ?Dr. Shearon Stalls, in the event that she does call back, would you like for the pharmacy team to check the price of another inhaler for the patient? The Judithann Sauger is $300 a month.  ?

## 2022-02-20 DIAGNOSIS — H401131 Primary open-angle glaucoma, bilateral, mild stage: Secondary | ICD-10-CM | POA: Diagnosis not present

## 2022-02-23 ENCOUNTER — Ambulatory Visit: Payer: Medicare Other | Admitting: Podiatry

## 2022-03-04 ENCOUNTER — Encounter: Payer: Self-pay | Admitting: Family Medicine
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
## 2022-03-05 NOTE — Telephone Encounter (Signed)
VV scheduled for tomorrow 03/06/22. ?

## 2022-03-06 ENCOUNTER — Telehealth: Payer: Medicare Other | Admitting: Family Medicine

## 2022-03-06 ENCOUNTER — Ambulatory Visit (INDEPENDENT_AMBULATORY_CARE_PROVIDER_SITE_OTHER): Payer: Medicare Other | Admitting: Podiatry

## 2022-03-06 ENCOUNTER — Encounter: Payer: Self-pay | Admitting: Family Medicine

## 2022-03-06 DIAGNOSIS — E785 Hyperlipidemia, unspecified: Secondary | ICD-10-CM | POA: Diagnosis not present

## 2022-03-06 DIAGNOSIS — E782 Mixed hyperlipidemia: Secondary | ICD-10-CM

## 2022-03-06 DIAGNOSIS — R0600 Dyspnea, unspecified: Secondary | ICD-10-CM

## 2022-03-06 DIAGNOSIS — E1149 Type 2 diabetes mellitus with other diabetic neurological complication: Secondary | ICD-10-CM

## 2022-03-06 DIAGNOSIS — I2510 Atherosclerotic heart disease of native coronary artery without angina pectoris: Secondary | ICD-10-CM

## 2022-03-06 DIAGNOSIS — G4733 Obstructive sleep apnea (adult) (pediatric): Secondary | ICD-10-CM

## 2022-03-06 DIAGNOSIS — I739 Peripheral vascular disease, unspecified: Secondary | ICD-10-CM | POA: Diagnosis not present

## 2022-03-06 DIAGNOSIS — F418 Other specified anxiety disorders: Secondary | ICD-10-CM | POA: Diagnosis not present

## 2022-03-06 DIAGNOSIS — L97521 Non-pressure chronic ulcer of other part of left foot limited to breakdown of skin: Secondary | ICD-10-CM | POA: Diagnosis not present

## 2022-03-06 DIAGNOSIS — E039 Hypothyroidism, unspecified: Secondary | ICD-10-CM

## 2022-03-06 DIAGNOSIS — E1169 Type 2 diabetes mellitus with other specified complication: Secondary | ICD-10-CM | POA: Diagnosis not present

## 2022-03-06 DIAGNOSIS — I152 Hypertension secondary to endocrine disorders: Secondary | ICD-10-CM

## 2022-03-06 DIAGNOSIS — E1159 Type 2 diabetes mellitus with other circulatory complications: Secondary | ICD-10-CM

## 2022-03-06 HISTORY — DX: Obstructive sleep apnea (adult) (pediatric): G47.33

## 2022-03-06 HISTORY — DX: Dyspnea, unspecified: R06.00

## 2022-03-06 MED ORDER — FLUOXETINE HCL 20 MG PO TABS: 20.0000 mg | ORAL_TABLET | Freq: Every day | ORAL | 1 refills | Status: DC

## 2022-03-06 MED ORDER — ALPRAZOLAM 0.5 MG PO TABS: 0.5000 mg | ORAL_TABLET | Freq: Two times a day (BID) | ORAL | 0 refills | Status: DC | PRN

## 2022-03-06 NOTE — Assessment & Plan Note (Signed)
He questions if the Sertraline has made him worse. Will switch to Fluoxetine 20 mg daily and will increase his alprazolam to 0.5 mg prn, no chnages ?

## 2022-03-06 NOTE — Assessment & Plan Note (Signed)
Encourage heart healthy diet such as MIND or DASH diet, increase exercise, avoid trans fats, simple carbohydrates and processed foods, consider a krill or fish or flaxseed oil cap daily.  °

## 2022-03-06 NOTE — Assessment & Plan Note (Signed)
On Levothyroxine, continue to monitor 

## 2022-03-06 NOTE — Assessment & Plan Note (Signed)
Recent sleep study noted mild apnea but significant episodes of hypoxia. After cardiac workup if that is normal consider rereferral to pulmonology.  ?

## 2022-03-06 NOTE — Assessment & Plan Note (Signed)
Well controlled, no changes to meds. Encouraged heart healthy diet such as the DASH diet and exercise as tolerated.  °

## 2022-03-06 NOTE — Patient Instructions (Signed)
Fluoxetine daily ?Alprazolam is as needed for anxiety attacks, irritability, stress ?

## 2022-03-07 ENCOUNTER — Telehealth (HOSPITAL_COMMUNITY): Payer: Self-pay | Admitting: *Deleted

## 2022-03-07 NOTE — Telephone Encounter (Signed)
-----   Message from Trula Slade, DPM sent at 03/07/2022  1:36 PM EDT ----- ?Regarding: RE: Ok to proceed with exercise in pulmonary rehab ?That should be OK as long as he keeps the toe covered and continue to use the offloading pads for the toe. We can watch it closely and if it looks like the ulcer is getting worse then we can reassess.  ?----- Message ----- ?From: Rowe Pavy, RN ?Sent: 03/06/2022   4:10 PM EDT ?To: Trula Slade, DPM ?Subject: Ok to proceed with exercise in pulmonary reh# ? ? ?Dr. Jacqualyn Posey, ? ?The above mutual patient has a referral to participate in pulmonary rehab 2 x week for 1 1/2 hours.  Exercise on various gym equipment including walking track.  I have been tracking him for some time now for readiness to proceed with weight bearing activities.  Based upon your assessment of his ulceration on today 5/2, Is this pt ready to proceed with scheduling pulmonary rehab?  Note that pt would need to be able to wear closed toes supported shoe and full weight bearing without assistance from staff ( assistive device is permitted) ? ?Thank you for your valuable input!! ? ?Thanks  ?Cherre Huger, BSN ?Cardiac and Pulmonary Rehab Nurse Navigator  ? ? ? ?

## 2022-03-08 NOTE — Progress Notes (Signed)
Subjective: ?[name] is a 76 y.o. is seen today for follow-up evaluation of a wound on the second toe.  He states that he is somewhat better.  Only is minimal amount of bloody drainage on the bandage when he changes but no pus.  No swelling or redness that he reports.  No new ulcerations.  Denies fevers or chills. ? ?Last A1c was 7.1 on September 07, 2021 ? ?Objective: ?General: No acute distress, AAOx3 -wife present ?Neurovascular status unchanged ?Left foot: Hyperkeratotic lesion noted at the distal aspect of the left second toe.  After debridement there is still a small superficial opening present without any probing to bone or tunneling.  There is no surrounding erythema, ascending cellulitis.  There is no fluctuation or crepitation.  There is no malodor.  Preulcerative lesion under the submetatarsal 1 area without any ulceration identified this time.  No other ulcerations are noted.   ?No pain with calf compression, swelling, warmth, erythema.  ? ?Assessment and Plan:  ?Ulceration left second toe, hammertoe; preulcerative submetatarsal 1 ? ?-Treatment options discussed including all alternatives, risks, and complications ?-Sharp debride the hyperkeratotic lesion on the left second toe.  I debrided the callus tissue down to reveal the underlying ulceration.  Measures 0.2 x 0.2 x 0.1 cm.  Prior to debridement it was difficult to measure as it was almost completely covered with callus.  Sharply debrided the wound down to healthy, viable, granular tissue noted promote wound healing utilizing #312 with scalpel.  I cleansed the wound with wound cleanser.  Iodosorb is applied followed by dressing.  Continue with daily dressing changes.  Continue offloading at all times. ?-Lightly debrided the hyperkeratotic lesion submetatarsal 1 without any complications or bleeding. ?-Monitor for any clinical signs or symptoms of infection and directed to call the office immediately should any occur or go to the ER. ? ?Return in  about 3 weeks (around 03/27/2022). ? ?[name] ? ? ? Wound appears to be doing better.  Still some localized edema but there is no erythema or any signs of infection noted at this time.  Continue offloading.  The procedure site where the flexor tenotomy was performed is healed. ?-Sharply debrided the preulcerative lesion on the left without any complications or bleeding.  Continue moisturizer, offloading daily. ?-Monitor for any clinical signs or symptoms of infection and directed to call the office immediately should any occur or go to the ER. ? ?[name] ?

## 2022-03-13 DIAGNOSIS — M5441 Lumbago with sciatica, right side: Secondary | ICD-10-CM | POA: Diagnosis not present

## 2022-03-13 DIAGNOSIS — G8929 Other chronic pain: Secondary | ICD-10-CM | POA: Diagnosis not present

## 2022-03-17 ENCOUNTER — Encounter: Payer: Self-pay | Admitting: Family Medicine

## 2022-03-17 ENCOUNTER — Other Ambulatory Visit: Payer: Self-pay | Admitting: Family Medicine

## 2022-03-19 ENCOUNTER — Other Ambulatory Visit: Payer: Self-pay

## 2022-03-19 ENCOUNTER — Ambulatory Visit: Payer: Medicare Other | Admitting: Podiatry

## 2022-03-19 ENCOUNTER — Other Ambulatory Visit: Payer: Self-pay | Admitting: Podiatry

## 2022-03-19 ENCOUNTER — Ambulatory Visit (INDEPENDENT_AMBULATORY_CARE_PROVIDER_SITE_OTHER): Payer: Medicare Other

## 2022-03-19 ENCOUNTER — Telehealth: Payer: Self-pay | Admitting: Podiatry

## 2022-03-19 DIAGNOSIS — L03116 Cellulitis of left lower limb: Secondary | ICD-10-CM

## 2022-03-19 DIAGNOSIS — L97521 Non-pressure chronic ulcer of other part of left foot limited to breakdown of skin: Secondary | ICD-10-CM | POA: Diagnosis not present

## 2022-03-19 DIAGNOSIS — M2042 Other hammer toe(s) (acquired), left foot: Secondary | ICD-10-CM

## 2022-03-19 MED ORDER — CEPHALEXIN 500 MG PO CAPS: 500.0000 mg | ORAL_CAPSULE | Freq: Four times a day (QID) | ORAL | 0 refills | Status: DC

## 2022-03-19 MED ORDER — ALPRAZOLAM 0.5 MG PO TABS: 0.5000 mg | ORAL_TABLET | Freq: Two times a day (BID) | ORAL | 0 refills | Status: DC | PRN

## 2022-03-19 MED ORDER — FLUOXETINE HCL 20 MG PO CAPS: 20.0000 mg | ORAL_CAPSULE | Freq: Every day | ORAL | 3 refills | Status: DC

## 2022-03-19 NOTE — Telephone Encounter (Signed)
Pt Wife called and said her Husband foot is red and bleeding a little, with fever and red on the ball of his heel. Schedule is full. I might be able to put him in for tomorrow at 1:15pm if permitted. ? ?Please advise ?

## 2022-03-19 NOTE — Progress Notes (Signed)
Error, please disregard.

## 2022-03-21 NOTE — Progress Notes (Signed)
Subjective: ?76 year old male presents the office today with his wife for an acute appointment given concern of worsening redness and swelling to his left foot.  His wife states that when she change the bandage yesterday it was more red and had some increased temperature to the foot.  No purulence noted.  The patient denies any fevers or chills.  No nausea or vomiting.  No chest pain or shortness of breath.  No other concerns. ? ?Objective: ?AAO x3, NAD ?DP/PT pulses palpable bilaterally, CRT less than 3 seconds ?Hammertoe deformity noted of the left foot.  This would result in a hyperkeratotic lesion to the distal aspect of the toe.  There is some increased edema and erythema present to the toe.  There is no ascending cellulitis present.  On the remnant of the first metatarsal head is a hyperkeratotic lesion that measures or pus.  No edema. ?No pain with calf compression, swelling, warmth, erythema ? ?Assessment: ?76 year old male with cellulitis left second toe, rule out osteomyelitis ? ?Plan: ?-All treatment options discussed with the patient including all alternatives, risks, complications.  ?-Repeat x-rays were obtained and reviewed.  No obvious cortical destruction suggestive of osteomyelitis compared to prior x-rays.  Appears to be stable compared to prior x-rays.  There is some lucency of the distal portion of distal phalanx but this appears to be chronic.  There is no soft tissue edema. ?-Discussed concern for possible osteomyelitis as well as the infection.  I started on Keflex 500 mg 4 times a day.  He is to monitor very closely there is any worsening signs or symptoms of infection report to emergency department.-Also discussed surgical intervention including possible partial toe rotation versus arthroplasty dependent if we can salvage the toe.  At this point he is having reoccurrence of the wound and infections which I am concerned about.  I discussed the surgery as well as postoperative course.   Unfortunately he will need to take time off of work with surgery.  He understands this. ?-Sharply debrided the hyperkeratotic lesion, wound today without any complications and no bleeding down to healthy, granular tissue utilizing #312 with scalpel to remove any nonviable devitalized tissue in order to promote wound healing.  Iodosorb was applied followed by dressing.  Continue with daily dressing changes. ?-Patient encouraged to call the office with any questions, concerns, change in symptoms.  ? ?[name] ? ? ?

## 2022-03-22 ENCOUNTER — Encounter: Payer: Self-pay | Admitting: Podiatry

## 2022-03-22 ENCOUNTER — Other Ambulatory Visit: Payer: Self-pay | Admitting: Podiatry

## 2022-03-22 DIAGNOSIS — L97521 Non-pressure chronic ulcer of other part of left foot limited to breakdown of skin: Secondary | ICD-10-CM | POA: Diagnosis not present

## 2022-03-22 LAB — C-REACTIVE PROTEIN: CRP: 3.8 mg/L (ref <8.0)

## 2022-03-22 NOTE — Telephone Encounter (Signed)
Please advise 

## 2022-03-23 ENCOUNTER — Other Ambulatory Visit: Payer: Self-pay | Admitting: Podiatry

## 2022-03-23 LAB — BASIC METABOLIC PANEL WITH GFR
BUN: 21 mg/dL (ref 7–25)
CO2: 29 mmol/L (ref 20–32)
Calcium: 9.1 mg/dL (ref 8.6–10.3)
Chloride: 102 mmol/L (ref 98–110)
Creat: 0.96 mg/dL (ref 0.70–1.28)
Glucose, Bld: 186 mg/dL — ABNORMAL HIGH (ref 65–99)
Potassium: 5 mmol/L (ref 3.5–5.3)
Sodium: 140 mmol/L (ref 135–146)
eGFR: 83 mL/min/{1.73_m2} (ref >=60)

## 2022-03-23 LAB — CBC WITH DIFFERENTIAL/PLATELET
Absolute Monocytes: 585 cells/uL (ref 200–950)
Basophils Absolute: 39 cells/uL (ref 0–200)
Basophils Relative: 0.6 %
Eosinophils Absolute: 332 cells/uL (ref 15–500)
Eosinophils Relative: 5.1 %
HCT: 43.4 % (ref 38.5–50.0)
Hemoglobin: 14.6 g/dL (ref 13.2–17.1)
Lymphs Abs: 1846 cells/uL (ref 850–3900)
MCH: 31.9 pg (ref 27.0–33.0)
MCHC: 33.6 g/dL (ref 32.0–36.0)
MCV: 95 fL (ref 80.0–100.0)
MPV: 10.5 fL (ref 7.5–12.5)
Monocytes Relative: 9 %
Neutro Abs: 3699 cells/uL (ref 1500–7800)
Neutrophils Relative %: 56.9 %
Platelets: 190 10*3/uL (ref 140–400)
RBC: 4.57 10*6/uL (ref 4.20–5.80)
RDW: 12.8 % (ref 11.0–15.0)
Total Lymphocyte: 28.4 %
WBC: 6.5 10*3/uL (ref 3.8–10.8)

## 2022-03-23 LAB — SEDIMENTATION RATE: Sed Rate: 6 mm/h (ref 0–20)

## 2022-03-23 NOTE — Progress Notes (Signed)
error 

## 2022-03-26 ENCOUNTER — Encounter: Payer: Self-pay | Admitting: Internal Medicine

## 2022-03-29 ENCOUNTER — Ambulatory Visit: Payer: Medicare Other | Admitting: Podiatry

## 2022-03-30 ENCOUNTER — Ambulatory Visit: Payer: Medicare Other | Admitting: Podiatry

## 2022-03-30 ENCOUNTER — Ambulatory Visit: Payer: Medicare Other

## 2022-03-30 DIAGNOSIS — E1142 Type 2 diabetes mellitus with diabetic polyneuropathy: Secondary | ICD-10-CM

## 2022-03-30 DIAGNOSIS — M2042 Other hammer toe(s) (acquired), left foot: Secondary | ICD-10-CM

## 2022-03-30 DIAGNOSIS — L97521 Non-pressure chronic ulcer of other part of left foot limited to breakdown of skin: Secondary | ICD-10-CM | POA: Diagnosis not present

## 2022-03-30 DIAGNOSIS — E11621 Type 2 diabetes mellitus with foot ulcer: Secondary | ICD-10-CM

## 2022-03-30 NOTE — Progress Notes (Signed)
SITUATION Reason for Consult: Follow-up with diabetic shoes and insoles Patient / Caregiver Report: Patient needs left insole modified to match previous  OBJECTIVE DATA History / Diagnosis:    ICD-10-CM   1. Type 2 diabetes mellitus with left diabetic foot ulcer (HCC)  E11.621    L97.529       Change in Pathology: None  ACTIONS PERFORMED Patient's equipment was checked for structural stability and fit. Carved insole reliefs to match previous. Device(s) intact and fit is excellent. All questions answered and concerns addressed.  PLAN Follow-up as needed (PRN). Plan of care discussed with and agreed upon by patient / caregiver.

## 2022-03-31 ENCOUNTER — Other Ambulatory Visit: Payer: Self-pay | Admitting: Family Medicine

## 2022-04-02 NOTE — Progress Notes (Signed)
Subjective: 76 year old male presents the office today with his wife for follow-up evaluation of wound on his left second toe.  Since I last saw him there is been a blister formed just adjacent to the wound.  Some fluid did come out of this but there is no pus.  Otherwise the wound itself has been stable.  Swelling or redness somewhat improved.  No fevers or chills.  Objective: AAO x3, NAD DP/PT pulses palpable bilaterally, CRT less than 3 seconds Hammertoe deformity noted of the left foot.  The distal aspect of the toes hyperkeratotic lesion.  Just adjacent to this area appears to be the area where there was a blister which is able to debride today.  There is no drainage or pus or any fluctuation or crepitation.  The wound itself has a granular base without any probing, undermining or tunneling.  There is trace edema and erythema to the distal aspect the toe but there is no fluctuation or crepitation.  Wound itself measures 0.3 x 0.2 x 0.1 cm after debridement. No pain with calf compression, swelling, warmth, erythema  Assessment: 76 year old male with cellulitis left second toe  Plan: -All treatment options discussed with the patient including all alternatives, risks, complications.  -Sharply debrided the hyperkeratotic lesion, ulcer today utilizing a #312 with scalpel down to healthy, granular tissue.  There is no blood loss.  He tolerated procedure well.  I cleaned it with saline.  Continue with daily dressing changes.  Silvadene was applied today followed by dressing. -He is given very close monitoring of the wound.  We can discuss surgery versus conservative care.  He is hesitant to do the surgery as he will be out of work.  We will try to further modify his orthotic today.  He was seen by Aaron Edelman for this.  If the wound continues there is any worsening we discussed surgery discussed partial amputation of the toe given the ongoing nature of the wound as well as deformity of the toe.  I discussed  with his wife who states that he does not wear his shoes around the house.  I encouraged him to make sure he is offloading at all times given the neuropathy is likely was causing the swelling to continue.  [name]

## 2022-04-03 MED ORDER — ALPRAZOLAM 0.5 MG PO TABS: 0.5000 mg | ORAL_TABLET | Freq: Two times a day (BID) | ORAL | 1 refills | Status: DC | PRN

## 2022-04-03 NOTE — Telephone Encounter (Signed)
Requesting: Alprazolam 0.'5mg'$  Contract: 12/06/2016 UDS: 01/30/2022 Last Visit: 03/06/2022 Next Visit: 06/05/2022 Last Refill: 03/19/2022 #20 with 0 RF  Please Advise

## 2022-04-03 NOTE — Telephone Encounter (Signed)
Requesting:xanax 0.'5mg'$  Contract:unknown UDS:01/30/22 Last Visit:01/30/22 Next Visit:06/05/22 Last Refill:03/19/22  Please Advise   Patient only got a 10 day supply on 03/19/22

## 2022-04-10 ENCOUNTER — Ambulatory Visit: Payer: Medicare Other | Admitting: Podiatry

## 2022-04-13 ENCOUNTER — Ambulatory Visit: Payer: Medicare Other | Admitting: Podiatry

## 2022-04-17 ENCOUNTER — Ambulatory Visit (INDEPENDENT_AMBULATORY_CARE_PROVIDER_SITE_OTHER): Payer: Medicare Other | Admitting: Podiatry

## 2022-04-17 DIAGNOSIS — L97529 Non-pressure chronic ulcer of other part of left foot with unspecified severity: Secondary | ICD-10-CM

## 2022-04-17 DIAGNOSIS — L97521 Non-pressure chronic ulcer of other part of left foot limited to breakdown of skin: Secondary | ICD-10-CM

## 2022-04-17 DIAGNOSIS — M2042 Other hammer toe(s) (acquired), left foot: Secondary | ICD-10-CM

## 2022-04-17 DIAGNOSIS — E11621 Type 2 diabetes mellitus with foot ulcer: Secondary | ICD-10-CM

## 2022-04-20 ENCOUNTER — Ambulatory Visit: Payer: Medicare Other | Admitting: Podiatry

## 2022-04-24 NOTE — Progress Notes (Signed)
Subjective: 76 year old male presents the office today with his wife for follow-up evaluation of wound on his left second toe.  He states the toe is doing better.  Not seeing any significant drainage or pus and no swelling or redness.  They have been applying Iodosorb to the wound.  Denies any fevers or chills.  No other concerns today.  Objective: AAO x3, NAD DP/PT pulses palpable bilaterally, CRT less than 3 seconds Hammertoe deformity noted of the left foot.  Hyperkeratotic tissue with dried blood present distally.  Upon debridement only small superficial pinpoint opening is present but appears to be much improved there is some mild edema there is no erythema or ascending cellulitis.  There is no fluctuance or crepitation.  No malodor. Preulcerative lesion on debridement of the first metatarsal. No pain with calf compression, swelling, warmth, erythema  Assessment: 76 year old male with improved cellulitis left second toe, healing wound  Plan: -All treatment options discussed with the patient including all alternatives, risks, complications.  -I debrided hyperkeratotic tissue any complications or bleeding.  I would continue with medical dressing changes daily as well as offloading at all times.  Can discuss surgical intervention however since the wound is doing better and hold off on that for now but discussed that there is any reoccurrence we will likely proceed with partial rotation of the toe.  He understands this and is agreeable if the wound would worsen. -Continue to monitor that area plantarly under the remnant of the first metatarsal. -Daily foot inspection, glucose control.  [name]

## 2022-05-04 ENCOUNTER — Ambulatory Visit: Payer: Medicare Other | Admitting: Cardiovascular Disease

## 2022-05-04 ENCOUNTER — Encounter: Payer: Self-pay | Admitting: Cardiovascular Disease

## 2022-05-04 DIAGNOSIS — E785 Hyperlipidemia, unspecified: Secondary | ICD-10-CM

## 2022-05-04 DIAGNOSIS — E1169 Type 2 diabetes mellitus with other specified complication: Secondary | ICD-10-CM

## 2022-05-04 DIAGNOSIS — I152 Hypertension secondary to endocrine disorders: Secondary | ICD-10-CM | POA: Diagnosis not present

## 2022-05-04 DIAGNOSIS — E1159 Type 2 diabetes mellitus with other circulatory complications: Secondary | ICD-10-CM | POA: Diagnosis not present

## 2022-05-04 DIAGNOSIS — I779 Disorder of arteries and arterioles, unspecified: Secondary | ICD-10-CM | POA: Insufficient documentation

## 2022-05-04 DIAGNOSIS — I6523 Occlusion and stenosis of bilateral carotid arteries: Secondary | ICD-10-CM

## 2022-05-04 DIAGNOSIS — I701 Atherosclerosis of renal artery: Secondary | ICD-10-CM

## 2022-05-04 DIAGNOSIS — I739 Peripheral vascular disease, unspecified: Secondary | ICD-10-CM | POA: Diagnosis not present

## 2022-05-04 HISTORY — DX: Disorder of arteries and arterioles, unspecified: I77.9

## 2022-05-04 NOTE — Assessment & Plan Note (Signed)
History of right renal artery stenosis demonstrated by Dr. Carlis Abbott at the time of angiography.  His most recent renal Doppler studies performed 04/18/2021 revealed renal aortic ratios of 4.65 on the right and 4.39 on the left with preserved renal dimensions.  We will recheck renal Doppler studies.

## 2022-05-04 NOTE — Assessment & Plan Note (Signed)
History of hyperlipidemia on high-dose statin therapy with lipid profile performed 01/30/2022 revealing total cholesterol 144, LDL 61 and HDL 30.

## 2022-05-04 NOTE — Assessment & Plan Note (Signed)
History of essential hypertension blood pressure measured today at 128/70.  He is on carvedilol and losartan.

## 2022-05-04 NOTE — Patient Instructions (Signed)
Medication Instructions:  Your physician recommends that you continue on your current medications as directed. Please refer to the Current Medication list given to you today.  *If you need a refill on your cardiac medications before your next appointment, please call your pharmacy*   Testing/Procedures: Your physician has requested that you have a carotid duplex. This test is an ultrasound of the carotid arteries in your neck. It looks at blood flow through these arteries that supply the brain with blood. Allow one hour for this exam. There are no restrictions or special instructions.  Your physician has requested that you have a lower extremity arterial duplex. This test is an ultrasound of the arteries in the legs. It looks at arterial blood flow in the legs. Allow one hour for Lower Arterial scans. There are no restrictions or special instructions  Your physician has requested that you have an ankle brachial index ([name]). During this test an ultrasound and blood pressure cuff are used to evaluate the arteries that supply the arms and legs with blood. Allow thirty minutes for this exam. There are no restrictions or special instructions.  Your physician has requested that you have a renal artery duplex. During this test, an ultrasound is used to evaluate blood flow to the kidneys. Allow one hour for this exam. Do not eat after midnight the day before and avoid carbonated beverages. Take your medications as you usually do. These procedures will be done at [place]. [address]  Your physician has requested that you have an Aorta/Iliac Duplex. This will be take place at [place], [address].   No food after 11PM the night before.  Water is OK. (Don't drink liquids if you have been instructed not to for ANOTHER test) Avoid foods that produce bowel gas, for 24 hours prior to exam (see below). No breakfast, no chewing gum, no smoking or carbonated beverages. Patient may take morning  medications with water. Come in for test at least 15 minutes early to register.    Follow-Up: At [facility], you and your health needs are our priority.  As part of our continuing mission to provide you with exceptional heart care, we have created designated Provider Care Teams.  These Care Teams include your primary Cardiologist (physician) and Advanced Practice Providers (APPs -  Physician Assistants and Nurse Practitioners) who all work together to provide you with the care you need, when you need it.  We recommend signing up for the patient portal called "MyChart".  Sign up information is provided on this After Visit Summary.  MyChart is used to connect with patients for Virtual Visits (Telemedicine).  Patients are able to view lab/test results, encounter notes, upcoming appointments, etc.  Non-urgent messages can be sent to your provider as well.   To learn more about what you can do with MyChart, go to [email or web address].    Your next appointment:   12 month(s)  The format for your next appointment:   In Person  Provider:   Quay Burow, MD

## 2022-05-04 NOTE — Assessment & Plan Note (Signed)
History of remote bilateral carotid endarterectomies.  His last carotid Doppler study performed 05/27/2019 revealed widely patent endarterectomy sites.  We will repeat carotid Doppler studies.  He does have a soft right carotid Cordarrel Stiefel.

## 2022-05-04 NOTE — Progress Notes (Signed)
05/04/2022 [name]   Jul 31, 1946  [id number]  Primary Physician [name], MD Primary Cardiologist: Lorretta Harp MD FACP, [place], [place], [place]  HPI:  [name] is a 76 y.o.   moderately overweight married Caucasian male father of 16, grandfather of 6 grandchildren who I last saw in the office 03/22/2021.  He is accompanied by his wife [name] today.  He was referred by [name] for peripheral vascular evaluation because of a recent CTA that showed a 4 cm abdominal aortic aneurysm and left iliac stenosis.  He does have a history of 60-pack-year tobacco abuse having quit 2008.  He also has treated hypertension, diabetes and hyperlipidemia.  He had a stroke back in 2007 but is never had a heart attack.  Does complain of some dyspnea probably related to COPD but denies chest pain.  He has a known abdominal aortic aneurysm, renal artery stenosis and left iliac stenosis.  He has also had bilateral carotid endarterectomies remotely.  Abdominal ultrasound performed 10/01/2016 did show a moderately severe left common iliac artery stenosis with a aortic dimension of 3.5 x 3.7 cm.  Recent CTA performed 09/18/2018 revealed an aortic dimension of 3.7 x 4 cm with what appeared to be a left common iliac artery stenosis.  Does have some left lower extremity discomfort with exercise consistent with claudication but this does not appear to be lifestyle limiting at this time.   He was admitted January 2021 with COVID-pneumonia which she recovered from.  He also had critical limb ischemia and underwent left great toe amputation with "kissing balloon" stenting using VBX stents by Dr. Carlis Abbott 05/28/2019.    Lower extremity Dopplers performed on 03/17/2021 revealed widely patent stents with normal ABIs bilaterally.   Since I saw him a year ago he continues to do well.  He denies chest pain but does have some shortness of breath probably attributable to his COPD.  He is placed on a pulmonary medicine for this.   He did have lower extremity arterial Doppler studies that showed widely patent iliac stents with an abdominal aortic aneurysm measuring 4.3 cm.   Current Meds  Medication Sig   Accu-Chek Softclix Lancets lancets USE TO CHECK BLOOD SUGAR 3 TO 4  TIMES WEEKLY   albuterol (PROAIR HFA) 108 (90 Base) MCG/ACT inhaler Inhale 2 puffs into the lungs every 6 (six) hours as needed for shortness of breath.   ALPRAZolam (XANAX) 0.5 MG tablet Take 1 tablet (0.5 mg total) by mouth 2 (two) times daily as needed for anxiety.   Ascorbic Acid (VITAMIN C) 1000 MG tablet Take 1,000 mg by mouth daily.   aspirin EC 81 MG tablet Take 81 mg by mouth at bedtime.   atorvastatin (LIPITOR) 80 MG tablet TAKE 1 TABLET BY MOUTH  DAILY AT 6 PM   BD PEN NEEDLE NANO 2ND GEN 32G X 4 MM MISC USE AS DIRECTED WITH HUMALOG PEN   Blood Glucose Monitoring Suppl (ACCU-CHEK GUIDE ME) w/Device KIT USE TO CHECK BLOOD SUGAR 3  TO 4 TIMES WEEKLY.  DX CODE E11.9   Budeson-Glycopyrrol-Formoterol (BREZTRI AEROSPHERE) 160-9-4.8 MCG/ACT AERO Inhale 2 puffs into the lungs in the morning and at bedtime.   carvedilol (COREG) 25 MG tablet TAKE ONE-HALF TABLET BY  MOUTH TWICE DAILY WITH  MEALS   cholecalciferol (VITAMIN D) 1000 UNITS tablet Take 1,000 Units by mouth daily.   clobetasol (OLUX) 0.05 % topical foam APPLY TO AFFECTED AREA TWICE A DAY   clopidogrel (  PLAVIX) 75 MG tablet TAKE 1 TABLET (75 MG TOTAL) BY MOUTH DAILY WITH BREAKFAST.   FLUoxetine (PROZAC) 20 MG capsule Take 1 capsule (20 mg total) by mouth daily.   glimepiride (AMARYL) 2 MG tablet TAKE 1 TABLET BY MOUTH  TWICE DAILY   glucose blood (ACCU-CHEK GUIDE) test strip Use to check blood sugar 3-4 times a week.  Dx code: E11.9   HYDROcodone-acetaminophen (NORCO) 10-325 MG tablet Take 1 tablet by mouth every 12 (twelve) hours as needed for moderate pain.   insulin lispro (HUMALOG KWIKPEN) 100 UNIT/ML KwikPen Before each meal 3 times a day, 140-199 - 3 units, 200-250 - 6 units, 251-299 - 8  units,  300-349 - 10 units,  350 or above 12 units. Insulin PEN if approved, provide syringes and needles if needed.   Latanoprost 0.005 % EMUL Place 1 drop into both eyes in the morning and at bedtime.   levothyroxine (SYNTHROID) 25 MCG tablet Take 1 tablet (25 mcg total) by mouth daily.   losartan (COZAAR) 50 MG tablet TAKE 1 TABLET BY MOUTH  DAILY   Magnesium 500 MG TABS Take 500 mg by mouth daily.   Multiple Vitamin (MULTIVITAMIN) tablet Take 1 tablet by mouth daily.   Omega-3 Fatty Acids (FISH OIL) 1000 MG CAPS Take 1,000 mg by mouth 2 (two) times daily.   pregabalin (LYRICA) 150 MG capsule Take 1 capsule (150 mg total) by mouth 2 (two) times daily.   sertraline (ZOLOFT) 50 MG tablet TAKE 1 TABLET BY MOUTH  DAILY   vitamin B-12 (CYANOCOBALAMIN) 1000 MCG tablet Take 1,000 mcg by mouth See admin instructions. Take one tablet by mouth twice weekly on Monday and Thursday per wife   zinc gluconate 50 MG tablet Take 50 mg by mouth daily.     Allergies  Allergen Reactions   Metformin Diarrhea   Prednisone Itching    Social History   Socioeconomic History   Marital status: Married    Spouse name: [name]   Number of children: 3   Years of education: 12th grade   Highest education level: Not on file  Occupational History   Occupation: Maintenance    Comment: Primary Care at [employer]  Tobacco Use   Smoking status: Former    Packs/day: 2.00    Years: 40.00    Total pack years: 80.00    Types: Cigarettes, Cigars    Quit date: 05/04/2006    Years since quitting: 16.0   Smokeless tobacco: Never  Vaping Use   Vaping Use: Never used  Substance and Sexual Activity   Alcohol use: Not Currently    Alcohol/week: 0.0 standard drinks of alcohol    Comment: rare - 11/10/2012 "quit > 20 yr ago"   Drug use: No   Sexual activity: Not Currently  Other Topics Concern   Not on file  Social History Narrative   Lives with wife (1993), no pets   Grown children.   Occupation: retired, Games developer,  [employer])   Activity: golf, gardening    Diet: good water, fruits/vegetables daily   Social Determinants of Health   Financial Resource Strain: Medium Risk (08/23/2021)   Overall Financial Resource Strain (CARDIA)    Difficulty of Paying Living Expenses: Somewhat hard  Food Insecurity: No Food Insecurity (08/23/2021)   Hunger Vital Sign    Worried About Running Out of Food in the Last Year: Never true    Ran Out of Food in the Last Year: Never true  Transportation Needs: No  Transportation Needs (08/23/2021)   PRAPARE - Hydrologist (Medical): No    Lack of Transportation (Non-Medical): No  Physical Activity: Insufficiently Active (08/23/2021)   Exercise Vital Sign    Days of Exercise per Week: 3 days    Minutes of Exercise per Session: 20 min  Stress: No Stress Concern Present (08/23/2021)   [place]    Feeling of Stress : Only a little  Social Connections: Moderately Integrated (08/23/2021)   Social Connection and Isolation Panel [NHANES]    Frequency of Communication with Friends and Family: Twice a week    Frequency of Social Gatherings with Friends and Family: Once a week    Attends Religious Services: More than 4 times per year    Active Member of [employer] or Organizations: No    Attends Archivist Meetings: Never    Marital Status: Married  Human resources officer Violence: Not At Risk (08/23/2021)   Humiliation, Afraid, Rape, and Kick questionnaire    Fear of Current or Ex-Partner: No    Emotionally Abused: No    Physically Abused: No    Sexually Abused: No     Review of Systems: General: negative for chills, fever, night sweats or weight changes.  Cardiovascular: negative for chest pain, dyspnea on exertion, edema, orthopnea, palpitations, paroxysmal nocturnal dyspnea or shortness of breath Dermatological: negative for rash Respiratory: negative  for cough or wheezing Urologic: negative for hematuria Abdominal: negative for nausea, vomiting, diarrhea, bright red blood per rectum, melena, or hematemesis Neurologic: negative for visual changes, syncope, or dizziness All other systems reviewed and are otherwise negative except as noted above.    Blood pressure 128/70, pulse (!) 59, height _0  (1.753 m), weight 244 lb (110.7 kg).  General appearance: alert and no distress Neck: no adenopathy, no JVD, supple, symmetrical, trachea midline, thyroid not enlarged, symmetric, no tenderness/mass/nodules, and soft right carotid bruit Lungs: clear to auscultation bilaterally Heart: regular rate and rhythm, S1, S2 normal, no murmur, click, rub or gallop Extremities: extremities normal, atraumatic, no cyanosis or edema Pulses: 2+ and symmetric Skin: Skin color, texture, turgor normal. No rashes or lesions Neurologic: Grossly normal  EKG sinus bradycardia 59 with right bundle branch block.  I personally reviewed this EKG.  ASSESSMENT AND PLAN:   PVD (peripheral vascular disease) ([place]) History of peripheral arterial disease with a known small abdominal aortic aneurysm.  He is status post bilateral iliac stenting using "kissing stent technique and VBX stents by Dr. Carlis Abbott 05/28/2019 in the setting of critical limb ischemia after which he underwent left great toe amputation.  His Dopplers performed 03/17/2021 revealed normal ABIs bilaterally with widely patent stents.  His abdominal aorta at that time measured 4.3 cm.  He denies claudication.  We will repeat aortoiliac and lower extremities show Doppler studies.  Hypertension associated with type 2 diabetes mellitus ([place]) History of essential hypertension blood pressure measured today at 128/70.  He is on carvedilol and losartan.  Renal artery stenosis ([facility]) History of right renal artery stenosis demonstrated by Dr. Carlis Abbott at the time of angiography.  His most recent renal Doppler studies performed  04/18/2021 revealed renal aortic ratios of 4.65 on the right and 4.39 on the left with preserved renal dimensions.  We will recheck renal Doppler studies.  Carotid artery disease ([place]) History of remote bilateral carotid endarterectomies.  His last carotid Doppler study performed 05/27/2019 revealed widely patent endarterectomy sites.  We will repeat carotid  Doppler studies.  He does have a soft right carotid Dalisa Forrer.  Hyperlipidemia associated with type 2 diabetes mellitus ([place]) History of hyperlipidemia on high-dose statin therapy with lipid profile performed 01/30/2022 revealing total cholesterol 144, LDL 61 and HDL 30.     Lorretta Harp MD FACP,FACC,FAHA, [facility] 05/04/2022 11:10 AM

## 2022-05-04 NOTE — Assessment & Plan Note (Signed)
History of peripheral arterial disease with a known small abdominal aortic aneurysm.  He is status post bilateral iliac stenting using "kissing stent technique and VBX stents by Dr. Carlis Abbott 05/28/2019 in the setting of critical limb ischemia after which he underwent left great toe amputation.  His Dopplers performed 03/17/2021 revealed normal ABIs bilaterally with widely patent stents.  His abdominal aorta at that time measured 4.3 cm.  He denies claudication.  We will repeat aortoiliac and lower extremities show Doppler studies.

## 2022-05-14 ENCOUNTER — Encounter: Payer: Self-pay | Admitting: Family Medicine

## 2022-05-15 ENCOUNTER — Ambulatory Visit (INDEPENDENT_AMBULATORY_CARE_PROVIDER_SITE_OTHER): Payer: Medicare Other | Admitting: Podiatry

## 2022-05-15 DIAGNOSIS — L97529 Non-pressure chronic ulcer of other part of left foot with unspecified severity: Secondary | ICD-10-CM

## 2022-05-15 DIAGNOSIS — M2042 Other hammer toe(s) (acquired), left foot: Secondary | ICD-10-CM | POA: Diagnosis not present

## 2022-05-15 DIAGNOSIS — E11621 Type 2 diabetes mellitus with foot ulcer: Secondary | ICD-10-CM | POA: Diagnosis not present

## 2022-05-15 DIAGNOSIS — L97521 Non-pressure chronic ulcer of other part of left foot limited to breakdown of skin: Secondary | ICD-10-CM

## 2022-05-15 MED ORDER — CLOPIDOGREL BISULFATE 75 MG PO TABS: 75.0000 mg | ORAL_TABLET | Freq: Every day | ORAL | 1 refills | Status: DC

## 2022-05-15 MED ORDER — PREGABALIN 150 MG PO CAPS: 150.0000 mg | ORAL_CAPSULE | Freq: Two times a day (BID) | ORAL | 1 refills | Status: DC

## 2022-05-15 MED ORDER — INSULIN LISPRO (1 UNIT DIAL) 100 UNIT/ML (KWIKPEN): PEN_INJECTOR | SUBCUTANEOUS | 3 refills | Status: AC

## 2022-05-15 MED ORDER — ALPRAZOLAM 0.5 MG PO TABS
0.5000 mg | ORAL_TABLET | Freq: Two times a day (BID) | ORAL | 1 refills | Status: DC | PRN
Start: 2022-05-15 — End: 2022-06-26

## 2022-05-15 NOTE — Telephone Encounter (Signed)
Requesting: Lyrica '150mg'$   Contract: 12/06/2016 UDS: 01/30/22 Last Visit: 03/06/22 Next Visit: 06/05/22 w/ Lenna Sciara Last Refill: 11/15/21 #180 and 1RF  Please Advise

## 2022-05-15 NOTE — Telephone Encounter (Signed)
Requesting: alprazolam 0.'5mg'$   Contract: 12/06/16 UDS: 01/30/22 Last Visit: 03/06/22 Next Visit: 06/05/22 w/ Lenna Sciara Last Refill: 04/03/22 #30 and 1RF  Please Advise

## 2022-05-18 NOTE — Progress Notes (Signed)
Subjective: 76 year old male presents the office today with his wife for follow-up evaluation of wound on his left second toe.  States he is doing well.  They have been keeping Iodosorb on the wounds.  No drainage or pus or swelling or redness.  Overall he thinks he is doing much better.  Denies any fevers or chills.  No other concerns.   Objective: AAO x3, NAD DP/PT pulses palpable bilaterally, CRT less than 3 seconds Hammertoe deformity noted of the left foot.  Appears to have lesions present the distal aspect of the second toe as well as on the metatarsal head area plantar aspect.  Upon debridement there is no ongoing ulceration but the areas are preulcerative.  There is no surrounding erythema, ascending cellulitis.  No fluctuance or crepitation.  There is no malodor. No pain with calf compression, swelling, warmth, erythema  Assessment: 76 year old male with improved cellulitis left second toe, healing wound  Plan: -All treatment options discussed with the patient including all alternatives, risks, complications. -Again discussed with conservative as well as surgical treatment options however at this time he is doing better so would continue conservative local wound care.  Discussed that he does need to try to save his feet is much as possible and daily foot inspection to help monitor for any worsening. -Debrided hyperkeratotic tissue with any complications or bleeding.  Continue that as well, offloading. -Monitor for any clinical signs or symptoms of infection and directed to call the office immediately should any occur or go to the ER.  Return in about 4 weeks (around 06/12/2022).  [name]

## 2022-05-22 ENCOUNTER — Encounter (HOSPITAL_COMMUNITY): Payer: Medicare Other

## 2022-05-25 ENCOUNTER — Ambulatory Visit (HOSPITAL_BASED_OUTPATIENT_CLINIC_OR_DEPARTMENT_OTHER)
Admission: RE | Admit: 2022-05-25 | Discharge: 2022-05-25 | Disposition: A | Discharge status: Home / Self Care | Payer: Medicare Other | Source: Ambulatory Visit | Attending: Cardiovascular Disease | Admitting: Cardiovascular Disease

## 2022-05-25 ENCOUNTER — Encounter (HOSPITAL_COMMUNITY): Payer: Medicare Other

## 2022-05-25 ENCOUNTER — Other Ambulatory Visit: Payer: Self-pay | Admitting: Cardiovascular Disease

## 2022-05-25 ENCOUNTER — Ambulatory Visit (HOSPITAL_COMMUNITY)
Admission: RE | Admit: 2022-05-25 | Discharge: 2022-05-25 | Disposition: A | Discharge status: Home / Self Care | Payer: Medicare Other | Source: Ambulatory Visit | Attending: Cardiovascular Disease | Admitting: Cardiovascular Disease

## 2022-05-25 DIAGNOSIS — I739 Peripheral vascular disease, unspecified: Secondary | ICD-10-CM

## 2022-05-25 DIAGNOSIS — I152 Hypertension secondary to endocrine disorders: Secondary | ICD-10-CM

## 2022-05-25 DIAGNOSIS — Z8673 Personal history of transient ischemic attack (TIA), and cerebral infarction without residual deficits: Secondary | ICD-10-CM

## 2022-05-25 DIAGNOSIS — E1159 Type 2 diabetes mellitus with other circulatory complications: Secondary | ICD-10-CM | POA: Diagnosis not present

## 2022-05-29 ENCOUNTER — Encounter (HOSPITAL_COMMUNITY): Payer: Medicare Other

## 2022-05-29 ENCOUNTER — Other Ambulatory Visit: Payer: Self-pay | Admitting: Family Medicine

## 2022-05-29 ENCOUNTER — Encounter: Payer: Self-pay | Admitting: Family Medicine

## 2022-06-01 ENCOUNTER — Telehealth (HOSPITAL_COMMUNITY): Payer: Self-pay

## 2022-06-01 NOTE — Telephone Encounter (Signed)
Pt works in [place] and would rather go to [facility] because it fits his schedule. Fax pulmonary rehab order to [place]. Closed referral.

## 2022-06-05 ENCOUNTER — Ambulatory Visit (INDEPENDENT_AMBULATORY_CARE_PROVIDER_SITE_OTHER): Payer: Medicare Other | Admitting: Family

## 2022-06-05 ENCOUNTER — Ambulatory Visit: Payer: Medicare Other | Admitting: Family Medicine

## 2022-06-05 ENCOUNTER — Telehealth: Payer: Self-pay | Admitting: Family

## 2022-06-05 VITALS — BP 117/48 | HR 62 | Temp 98.2°F | Resp 16 | Wt 245.0 lb

## 2022-06-05 DIAGNOSIS — E1159 Type 2 diabetes mellitus with other circulatory complications: Secondary | ICD-10-CM | POA: Diagnosis not present

## 2022-06-05 DIAGNOSIS — M1990 Unspecified osteoarthritis, unspecified site: Secondary | ICD-10-CM | POA: Diagnosis not present

## 2022-06-05 DIAGNOSIS — Z8673 Personal history of transient ischemic attack (TIA), and cerebral infarction without residual deficits: Secondary | ICD-10-CM

## 2022-06-05 DIAGNOSIS — E782 Mixed hyperlipidemia: Secondary | ICD-10-CM

## 2022-06-05 DIAGNOSIS — E1149 Type 2 diabetes mellitus with other diabetic neurological complication: Secondary | ICD-10-CM | POA: Diagnosis not present

## 2022-06-05 DIAGNOSIS — M1A9XX0 Chronic gout, unspecified, without tophus (tophi): Secondary | ICD-10-CM

## 2022-06-05 DIAGNOSIS — J432 Centrilobular emphysema: Secondary | ICD-10-CM

## 2022-06-05 DIAGNOSIS — E11621 Type 2 diabetes mellitus with foot ulcer: Secondary | ICD-10-CM | POA: Diagnosis not present

## 2022-06-05 DIAGNOSIS — I152 Hypertension secondary to endocrine disorders: Secondary | ICD-10-CM

## 2022-06-05 DIAGNOSIS — R413 Other amnesia: Secondary | ICD-10-CM | POA: Insufficient documentation

## 2022-06-05 DIAGNOSIS — F419 Anxiety disorder, unspecified: Secondary | ICD-10-CM | POA: Diagnosis not present

## 2022-06-05 DIAGNOSIS — E039 Hypothyroidism, unspecified: Secondary | ICD-10-CM | POA: Diagnosis not present

## 2022-06-05 DIAGNOSIS — L97529 Non-pressure chronic ulcer of other part of left foot with unspecified severity: Secondary | ICD-10-CM

## 2022-06-05 DIAGNOSIS — F411 Generalized anxiety disorder: Secondary | ICD-10-CM | POA: Insufficient documentation

## 2022-06-05 DIAGNOSIS — K219 Gastro-esophageal reflux disease without esophagitis: Secondary | ICD-10-CM | POA: Diagnosis not present

## 2022-06-05 HISTORY — DX: Unspecified osteoarthritis, unspecified site: M19.90

## 2022-06-05 HISTORY — DX: Generalized anxiety disorder: F41.1

## 2022-06-05 LAB — HEMOGLOBIN A1C: Hgb A1c MFr Bld: 7.8 % — ABNORMAL HIGH (ref 4.6–6.5)

## 2022-06-05 LAB — BASIC METABOLIC PANEL
BUN: 19 mg/dL (ref 6–23)
CO2: 28 mEq/L (ref 19–32)
Calcium: 9.1 mg/dL (ref 8.4–10.5)
Chloride: 103 mEq/L (ref 96–112)
Creatinine, Ser: 0.94 mg/dL (ref 0.40–1.50)
GFR: 79.69 mL/min (ref >60.00)
Glucose, Bld: 157 mg/dL — ABNORMAL HIGH (ref 70–99)
Potassium: 4.3 mEq/L (ref 3.5–5.1)
Sodium: 140 mEq/L (ref 135–145)

## 2022-06-05 LAB — TSH: TSH: 2.22 u[IU]/mL (ref 0.35–5.50)

## 2022-06-05 MED ORDER — MELOXICAM 7.5 MG PO TABS: 7.5000 mg | ORAL_TABLET | Freq: Every day | ORAL | 0 refills | Status: DC | PRN

## 2022-06-05 MED ORDER — FLUOXETINE HCL 40 MG PO CAPS: 40.0000 mg | ORAL_CAPSULE | Freq: Every day | ORAL | 0 refills | Status: DC

## 2022-06-05 NOTE — Assessment & Plan Note (Signed)
Lab Results  Component Value Date   HGBA1C 7.9 (H) 01/30/2022   HGBA1C 7.1 (H) 09/07/2021   HGBA1C 8.3 (H) 03/10/2021   Lab Results  Component Value Date   MICROALBUR 1.1 01/30/2022   LDLCALC 61 05/18/2021   CREATININE 0.96 03/22/2022   Will repeat A1C.

## 2022-06-05 NOTE — Telephone Encounter (Signed)
Records release will be faxed

## 2022-06-05 NOTE — Assessment & Plan Note (Signed)
Continue statin and aspirin for secondary prevention.

## 2022-06-05 NOTE — Assessment & Plan Note (Signed)
Working with Dr. Earleen Newport- podiatry for left toe ulcer.

## 2022-06-05 NOTE — Assessment & Plan Note (Signed)
Uncontrolled. He did not understand to stop sertraline when fluoxetine was started.  Advised pt to stop sertraline. Increase fluoxetine to 40 mg.

## 2022-06-05 NOTE — Patient Instructions (Addendum)
Stop sertraline. Continue fluoxetine but please increase from '20mg'$  to '40mg'$ .  Please complete lab work prior to leaving.

## 2022-06-05 NOTE — Assessment & Plan Note (Addendum)
New. Refer to neurology.
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
## 2022-06-05 NOTE — Assessment & Plan Note (Signed)
Lab Results  Component Value Date   CHOL 144 01/30/2022   HDL 30.30 (L) 01/30/2022   LDLCALC 61 05/18/2021   LDLDIRECT 94.0 01/30/2022   TRIG 225.0 (H) 01/30/2022   CHOLHDL 5 01/30/2022   Continue atorvastatin.  LDL stable.

## 2022-06-05 NOTE — Telephone Encounter (Signed)
Please call [facility] to request DM eye exame.

## 2022-06-05 NOTE — Assessment & Plan Note (Signed)
Lab Results  Component Value Date   TSH 1.88 01/30/2022   Clinically stable on synthroid. Continue same.

## 2022-06-05 NOTE — Assessment & Plan Note (Signed)
Uncontrolled. Trial of short course of meloxicam.

## 2022-06-05 NOTE — Assessment & Plan Note (Signed)
Stable on breztri and prn albuterol. Continue same.

## 2022-06-05 NOTE — Assessment & Plan Note (Signed)
Stable without daily medication. Monitor.

## 2022-06-05 NOTE — Assessment & Plan Note (Signed)
BP Readings from Last 3 Encounters:  06/05/22 (!) 117/48  05/04/22 128/70  01/30/22 (!) 142/82  BP OK, continue losartan and carvedilol.

## 2022-06-05 NOTE — Assessment & Plan Note (Signed)
Stable without recent flare.

## 2022-06-06 ENCOUNTER — Telehealth: Payer: Self-pay | Admitting: Family

## 2022-06-06 ENCOUNTER — Other Ambulatory Visit: Payer: Self-pay | Admitting: *Deleted

## 2022-06-06 DIAGNOSIS — J449 Chronic obstructive pulmonary disease, unspecified: Secondary | ICD-10-CM

## 2022-06-06 LAB — RPR: RPR Ser Ql: NONREACTIVE

## 2022-06-06 MED ORDER — PIOGLITAZONE HCL 30 MG PO TABS: 30.0000 mg | ORAL_TABLET | Freq: Every day | ORAL | 1 refills | Status: DC

## 2022-06-06 NOTE — Telephone Encounter (Signed)
Sugar is above goal. Please add actos '30mg'$  once daily.

## 2022-06-06 NOTE — Telephone Encounter (Signed)
Patient advised of results and new prescription

## 2022-06-07 ENCOUNTER — Other Ambulatory Visit: Payer: Self-pay | Admitting: Family Medicine

## 2022-06-07 ENCOUNTER — Encounter: Payer: Self-pay | Admitting: Physician Assistant

## 2022-06-12 ENCOUNTER — Ambulatory Visit (INDEPENDENT_AMBULATORY_CARE_PROVIDER_SITE_OTHER): Payer: Medicare Other | Admitting: Podiatry

## 2022-06-12 DIAGNOSIS — L84 Corns and callosities: Secondary | ICD-10-CM | POA: Diagnosis not present

## 2022-06-12 DIAGNOSIS — E1142 Type 2 diabetes mellitus with diabetic polyneuropathy: Secondary | ICD-10-CM

## 2022-06-12 NOTE — Progress Notes (Signed)
Subjective: 76 year old male presents the office today with his wife for follow-up evaluation of wound on his left second toe and for preulcerative area submetatarsal 1.  Peers at the callus, scab is gotten little bit worse on the submetatarsal 1 area.  Denies any opening, swelling redness or any drainage.  No new concerns.   Last A1c 7.8 on 06/05/2022  Objective: AAO x3, NAD DP/PT pulses palpable bilaterally, CRT less than 3 seconds Hammertoe deformity noted of the left foot.  Hyperkeratotic tissue present the distal aspect of the second toe but there is no underlying ulceration noted today but is still preulcerative.  Preulcerative callus noted submetatarsal 1 which appears to be slightly worse and there are some dried blood but upon debridement there is no underlying ulceration drainage or signs of infection of this area either.  There is no increase in swelling or redness of the foot or any increase in temperature. No pain with calf compression, swelling, warmth, erythema  Assessment: 76 year old male with improved cellulitis left second toe, healing wound  Plan: -All treatment options discussed with the patient including all alternatives, risks, complications. -Sharp debrided hyperkeratotic lesions x2 without any complications or bleeding.  Continue offloading and dispensed offloading pads as well.  Monitor for any signs or symptoms of infection.  [name]

## 2022-06-13 ENCOUNTER — Other Ambulatory Visit: Payer: Self-pay | Admitting: Family Medicine

## 2022-06-13 ENCOUNTER — Encounter (INDEPENDENT_AMBULATORY_CARE_PROVIDER_SITE_OTHER): Payer: Self-pay

## 2022-06-15 DIAGNOSIS — H401131 Primary open-angle glaucoma, bilateral, mild stage: Secondary | ICD-10-CM | POA: Diagnosis not present

## 2022-06-15 DIAGNOSIS — E119 Type 2 diabetes mellitus without complications: Secondary | ICD-10-CM | POA: Diagnosis not present

## 2022-06-15 DIAGNOSIS — H5213 Myopia, bilateral: Secondary | ICD-10-CM | POA: Diagnosis not present

## 2022-06-19 ENCOUNTER — Encounter: Payer: Self-pay | Admitting: Physician Assistant

## 2022-06-19 ENCOUNTER — Ambulatory Visit: Payer: Medicare Other | Admitting: Physician Assistant

## 2022-06-19 ENCOUNTER — Other Ambulatory Visit: Payer: Medicare Other

## 2022-06-19 VITALS — BP 114/53 | HR 63 | Ht 69.0 in | Wt 245.0 lb

## 2022-06-19 DIAGNOSIS — G3184 Mild cognitive impairment, so stated: Secondary | ICD-10-CM | POA: Diagnosis not present

## 2022-06-19 DIAGNOSIS — R413 Other amnesia: Secondary | ICD-10-CM | POA: Diagnosis not present

## 2022-06-19 NOTE — Patient Instructions (Addendum)
It was a pleasure to see you today at our office.   Recommendations:  Follow up in 1 month  MRI brain  Lab today    Whom to call:  Memory  decline, memory medications: Call our office [phone]   For psychiatric meds, mood meds: Please have your primary care physician manage these medications.   Counseling regarding caregiver distress, including caregiver depression, anxiety and issues regarding community resources, adult day care programs, adult living facilities, or memory care questions:   Feel free to contact Bertrand, Social Worker at [phone]   For assessment of decision of mental capacity and competency:  Call Dr. Anthoney Harada, geriatric psychiatrist at [phone]  For guidance in geriatric dementia issues please call [facility] [phone]  For guidance regarding [facility] and if placement were needed at the facility, contact Arnell Asal, Social Worker tel: [phone]  If you have any severe symptoms of a stroke, or other severe issues such as confusion,severe chills or fever, etc call 911 or go to the ER as you may need to be evaluated further    RECOMMENDATIONS FOR ALL PATIENTS WITH MEMORY PROBLEMS: 1. Continue to exercise (Recommend 30 minutes of walking everyday, or 3 hours every week) 2. Increase social interactions - continue going to [place] and enjoy social gatherings with friends and family 3. Eat healthy, avoid fried foods and eat more fruits and vegetables 4. Maintain adequate blood pressure, blood sugar, and blood cholesterol level. Reducing the risk of stroke and cardiovascular disease also helps promoting better memory. 5. Avoid stressful situations. Live a simple life and avoid aggravations. Organize your time and prepare for the next day in anticipation. 6. Sleep well, avoid any interruptions of sleep and avoid any distractions in the bedroom that may interfere with adequate sleep quality 7. Avoid  sugar, avoid sweets as there is a strong link between excessive sugar intake, diabetes, and cognitive impairment We discussed the Mediterranean diet, which has been shown to help patients reduce the risk of progressive memory disorders and reduces cardiovascular risk. This includes eating fish, eat fruits and green leafy vegetables, nuts like almonds and hazelnuts, walnuts, and also use olive oil. Avoid fast foods and fried foods as much as possible. Avoid sweets and sugar as sugar use has been linked to worsening of memory function.  There is always a concern of gradual progression of memory problems. If this is the case, then we may need to adjust level of care according to patient needs. Support, both to the patient and caregiver, should then be put into place.    FALL PRECAUTIONS: Be cautious when walking. Scan the area for obstacles that may increase the risk of trips and falls. When getting up in the mornings, sit up at the edge of the bed for a few minutes before getting out of bed. Consider elevating the bed at the head end to avoid drop of blood pressure when getting up. Walk always in a well-lit room (use night lights in the walls). Avoid area rugs or power cords from appliances in the middle of the walkways. Use a walker or a cane if necessary and consider physical therapy for balance exercise. Get your eyesight checked regularly.  FINANCIAL OVERSIGHT: Supervision, especially oversight when making financial decisions or transactions is also recommended.  HOME SAFETY: Consider the safety of the kitchen when operating appliances like stoves, microwave oven, and blender. Consider having supervision and share cooking responsibilities until no longer able to participate in  those. Accidents with firearms and other hazards in the house should be identified and addressed as well.   ABILITY TO BE LEFT ALONE: If patient is unable to contact 911 operator, consider using [facility], or when the need is  there, arrange for someone to stay with patients. Smoking is a fire hazard, consider supervision or cessation. Risk of wandering should be assessed by caregiver and if detected at any point, supervision and safe proof recommendations should be instituted.  MEDICATION SUPERVISION: Inability to self-administer medication needs to be constantly addressed. Implement a mechanism to ensure safe administration of the medications.   DRIVING: Regarding driving, in patients with progressive memory problems, driving will be impaired. We advise to have someone else do the driving if trouble finding directions or if minor accidents are reported. Independent driving assessment is available to determine safety of driving.   If you are interested in the driving assessment, you can contact the following:  [employer] in [place]  [place] [phone]  [facility] [phone] or [phone]   We have sent a referral to [facility] for your MRI and they will call you directly to schedule your appointment. They are located at [address]. If you need to contact them directly please call [phone].   Your provider has requested that you have labwork completed today. Please go to [facility] ([address]) on the second floor of this building before leaving the office today. You do not need to check in. If you are not called within 15 minutes please check with the front desk.
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
## 2022-06-20 LAB — VITAMIN B12: Vitamin B-12: 304 pg/mL (ref 211–911)

## 2022-06-21 NOTE — Progress Notes (Signed)
B12 is in the lower normal, would like him to supplement with 1000 mcg of B12 daily and follow-up with PCP.  Thank you

## 2022-06-25 ENCOUNTER — Encounter: Payer: Self-pay | Admitting: Family Medicine

## 2022-06-26 ENCOUNTER — Other Ambulatory Visit: Payer: Self-pay | Admitting: Family Medicine

## 2022-06-26 MED ORDER — ALPRAZOLAM 0.5 MG PO TABS: 0.5000 mg | ORAL_TABLET | Freq: Two times a day (BID) | ORAL | 1 refills | Status: DC | PRN

## 2022-06-26 NOTE — Telephone Encounter (Signed)
Requesting: alprazolam 0.'5mg'$   Contract: 06/05/22 UDS: 01/30/22 Last Visit: 06/05/22 Next Visit: 07/17/22 Last Refill: 05/15/22 #30 and 1RF Pt sig: 1 tab bid prn  Please Advise

## 2022-07-06 ENCOUNTER — Other Ambulatory Visit: Payer: Self-pay | Admitting: Dermatology

## 2022-07-06 DIAGNOSIS — L400 Psoriasis vulgaris: Secondary | ICD-10-CM

## 2022-07-09 ENCOUNTER — Encounter: Payer: Self-pay | Admitting: Family Medicine

## 2022-07-10 ENCOUNTER — Ambulatory Visit (INDEPENDENT_AMBULATORY_CARE_PROVIDER_SITE_OTHER): Payer: Medicare Other | Admitting: Podiatry

## 2022-07-10 DIAGNOSIS — L84 Corns and callosities: Secondary | ICD-10-CM

## 2022-07-10 DIAGNOSIS — E1142 Type 2 diabetes mellitus with diabetic polyneuropathy: Secondary | ICD-10-CM

## 2022-07-10 DIAGNOSIS — M2042 Other hammer toe(s) (acquired), left foot: Secondary | ICD-10-CM | POA: Diagnosis not present

## 2022-07-10 NOTE — Progress Notes (Signed)
Subjective: 76 year old male presents the office today with his wife for follow-up evaluation of wound on his left second toe and for preulcerative area submetatarsal 1.  His wife continues to change the bandage.  Denies any swelling redness or any drainage.  No fevers or chills.  No other concerns today.    Last A1c 7.8 on 06/05/2022  Objective: AAO x3, NAD DP/PT pulses palpable bilaterally, CRT less than 3 seconds Hammertoe deformity noted to the lesser digits.  Resulting in the hyperkeratotic lesion distal aspect of the second toe.  Hammertoe.  There is dried blood but there is no ongoing ulceration drainage or signs of infection.  Prominence on the remnant of the first metatarsal resultant preulcerative lesion.  See picture below.  There is no open lesions.  No swelling redness or drainage. No pain with calf compression, swelling, warmth, erythema     Assessment: 76 year old male with improved cellulitis left second toe, healing wound  Plan: -All treatment options discussed with the patient including all alternatives, risks, complications. -She will be debrided the preulcerative calluses x2 left without any complications or bleeding.  We need to continue working on offloading.  I did crying down his insert along the first metatarsal area to help offload.  Continue offloading the second toe.  Daily foot inspection, glucose control discussed.  [name]

## 2022-07-13 ENCOUNTER — Ambulatory Visit
Admission: RE | Admit: 2022-07-13 | Discharge: 2022-07-13 | Disposition: A | Discharge status: Home / Self Care | Payer: Medicare Other | Source: Ambulatory Visit | Attending: Physician Assistant | Admitting: Physician Assistant

## 2022-07-13 DIAGNOSIS — R413 Other amnesia: Secondary | ICD-10-CM | POA: Diagnosis not present

## 2022-07-16 NOTE — Progress Notes (Signed)
MRI of the brain does not show any new findings.  There is mild brain atrophy, as well as chronic circulation changes including old little strokes, all these may affect the memory.  Continue taking baby aspirin and Plavix and good control of your cardiovascular risk factors.  Thank you

## 2022-07-17 ENCOUNTER — Ambulatory Visit (INDEPENDENT_AMBULATORY_CARE_PROVIDER_SITE_OTHER): Payer: Medicare Other | Admitting: Family

## 2022-07-17 VITALS — BP 127/55 | HR 55 | Temp 97.7°F | Resp 16 | Wt 247.0 lb

## 2022-07-17 DIAGNOSIS — F419 Anxiety disorder, unspecified: Secondary | ICD-10-CM

## 2022-07-17 DIAGNOSIS — Z23 Encounter for immunization: Secondary | ICD-10-CM

## 2022-07-17 DIAGNOSIS — M1990 Unspecified osteoarthritis, unspecified site: Secondary | ICD-10-CM

## 2022-07-17 MED ORDER — FLUOXETINE HCL 40 MG PO CAPS
40.0000 mg | ORAL_CAPSULE | Freq: Every day | ORAL | 0 refills | Status: DC
Start: 2022-07-17 — End: 2022-09-18
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
## 2022-07-17 NOTE — Assessment & Plan Note (Signed)
Some improvement in his pain with prn meloxicam. Continue same.
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
## 2022-07-24 ENCOUNTER — Ambulatory Visit: Payer: Medicare Other | Admitting: Physician Assistant

## 2022-07-24 ENCOUNTER — Encounter: Payer: Self-pay | Admitting: Physician Assistant

## 2022-07-24 VITALS — Ht 69.0 in

## 2022-07-24 DIAGNOSIS — I729 Aneurysm of unspecified site: Secondary | ICD-10-CM | POA: Diagnosis not present

## 2022-07-24 DIAGNOSIS — M542 Cervicalgia: Secondary | ICD-10-CM | POA: Diagnosis not present

## 2022-07-24 DIAGNOSIS — G3184 Mild cognitive impairment, so stated: Secondary | ICD-10-CM | POA: Insufficient documentation

## 2022-07-24 MED ORDER — MEMANTINE HCL 5 MG PO TABS: ORAL_TABLET | ORAL | 11 refills | Status: DC

## 2022-07-24 NOTE — Patient Instructions (Addendum)
It was a pleasure to see you today at our office.   Recommendations:  Follow up in 3 months  Neurocognitive testing   Referral to Neurosurgery for Cervical antherolisthesis  Referral to [facility] for R carotid aneurysm  Start memantine 5 mg at night for 2 weeks and then increase to 1 tablet 2 times a day     Whom to call:  Memory  decline, memory medications: Call our office [phone]   For psychiatric meds, mood meds: Please have your primary care physician manage these medications.   Counseling regarding caregiver distress, including caregiver depression, anxiety and issues regarding community resources, adult day care programs, adult living facilities, or memory care questions:   Feel free to contact [place], Social Worker at [phone]   For assessment of decision of mental capacity and competency:  Call Dr. Anthoney Harada, geriatric psychiatrist at [phone]  For guidance in geriatric dementia issues please call [facility] [phone]  For guidance regarding [facility] and if placement were needed at the facility, contact Arnell Asal, Social Worker tel: [phone]  If you have any severe symptoms of a stroke, or other severe issues such as confusion,severe chills or fever, etc call 911 or go to the ER as you may need to be evaluated further    RECOMMENDATIONS FOR ALL PATIENTS WITH MEMORY PROBLEMS: 1. Continue to exercise (Recommend 30 minutes of walking everyday, or 3 hours every week) 2. Increase social interactions - continue going to [place] and enjoy social gatherings with friends and family 3. Eat healthy, avoid fried foods and eat more fruits and vegetables 4. Maintain adequate blood pressure, blood sugar, and blood cholesterol level. Reducing the risk of stroke and cardiovascular disease also helps promoting better memory. 5. Avoid stressful situations. Live a simple life and avoid aggravations. Organize your time  and prepare for the next day in anticipation. 6. Sleep well, avoid any interruptions of sleep and avoid any distractions in the bedroom that may interfere with adequate sleep quality 7. Avoid sugar, avoid sweets as there is a strong link between excessive sugar intake, diabetes, and cognitive impairment We discussed the Mediterranean diet, which has been shown to help patients reduce the risk of progressive memory disorders and reduces cardiovascular risk. This includes eating fish, eat fruits and green leafy vegetables, nuts like almonds and hazelnuts, walnuts, and also use olive oil. Avoid fast foods and fried foods as much as possible. Avoid sweets and sugar as sugar use has been linked to worsening of memory function.  There is always a concern of gradual progression of memory problems. If this is the case, then we may need to adjust level of care according to patient needs. Support, both to the patient and caregiver, should then be put into place.    FALL PRECAUTIONS: Be cautious when walking. Scan the area for obstacles that may increase the risk of trips and falls. When getting up in the mornings, sit up at the edge of the bed for a few minutes before getting out of bed. Consider elevating the bed at the head end to avoid drop of blood pressure when getting up. Walk always in a well-lit room (use night lights in the walls). Avoid area rugs or power cords from appliances in the middle of the walkways. Use a walker or a cane if necessary and consider physical therapy for balance exercise. Get your eyesight checked regularly.  FINANCIAL OVERSIGHT: Supervision, especially oversight when making financial decisions or transactions is  also recommended.  HOME SAFETY: Consider the safety of the kitchen when operating appliances like stoves, microwave oven, and blender. Consider having supervision and share cooking responsibilities until no longer able to participate in those. Accidents with firearms and  other hazards in the house should be identified and addressed as well.   ABILITY TO BE LEFT ALONE: If patient is unable to contact 911 operator, consider using [employer], or when the need is there, arrange for someone to stay with patients. Smoking is a fire hazard, consider supervision or cessation. Risk of wandering should be assessed by caregiver and if detected at any point, supervision and safe proof recommendations should be instituted.  MEDICATION SUPERVISION: Inability to self-administer medication needs to be constantly addressed. Implement a mechanism to ensure safe administration of the medications.   DRIVING: Regarding driving, in patients with progressive memory problems, driving will be impaired. We advise to have someone else do the driving if trouble finding directions or if minor accidents are reported. Independent driving assessment is available to determine safety of driving.   If you are interested in the driving assessment, you can contact the following:  [employer] in [place]  [place] [phone]  [facility] [phone] or [phone]   We have sent a referral to [facility] for your MRI and they will call you directly to schedule your appointment. They are located at [place]. If you need to contact them directly please call [phone].   Your provider has requested that you have labwork completed today. Please go to [facility] ([id number]) on the second floor of this building before leaving the office today. You do not need to check in. If you are not called within 15 minutes please check with the front desk.

## 2022-07-26 ENCOUNTER — Other Ambulatory Visit: Payer: Self-pay | Admitting: Family Medicine

## 2022-07-26 ENCOUNTER — Other Ambulatory Visit: Payer: Self-pay | Admitting: Family

## 2022-07-26 DIAGNOSIS — M1990 Unspecified osteoarthritis, unspecified site: Secondary | ICD-10-CM

## 2022-08-05 ENCOUNTER — Other Ambulatory Visit: Payer: Self-pay | Admitting: Family Medicine

## 2022-08-07 ENCOUNTER — Telehealth: Payer: Self-pay | Admitting: Physician Assistant

## 2022-08-07 ENCOUNTER — Ambulatory Visit: Payer: Medicare Other | Admitting: Podiatry

## 2022-08-07 DIAGNOSIS — E11621 Type 2 diabetes mellitus with foot ulcer: Secondary | ICD-10-CM | POA: Diagnosis not present

## 2022-08-07 DIAGNOSIS — L84 Corns and callosities: Secondary | ICD-10-CM

## 2022-08-07 DIAGNOSIS — L97529 Non-pressure chronic ulcer of other part of left foot with unspecified severity: Secondary | ICD-10-CM | POA: Diagnosis not present

## 2022-08-07 DIAGNOSIS — M2042 Other hammer toe(s) (acquired), left foot: Secondary | ICD-10-CM | POA: Diagnosis not present

## 2022-08-07 NOTE — Telephone Encounter (Signed)
Patient wife states that we were to send patient to see Dr Arnoldo Morale or Dr Doren Custard and they have not heard anything from them please check status and call patient wife

## 2022-08-08 NOTE — Telephone Encounter (Signed)
I left detailed message on machine with the number to call regarding the referral. Happy early birthday to him as well

## 2022-08-08 NOTE — Telephone Encounter (Signed)
[phone], [facility] referral was already placed. Will call after 8:00am

## 2022-08-12 NOTE — Progress Notes (Signed)
Subjective: Chief Complaint  Patient presents with   Diabetic Ulcer    Diabetic ulcer left foot, A1c- 7.1 BG- 136 (Yesterday) patient denies any pain     76 year old male presents the office today with his wife for follow-up evaluation of wound on his left second toe and for preulcerative area submetatarsal 1.  Denies any opening, swelling or redness or any drainage.  No other concerns.  No fevers or chills.   Last A1c 7.8 on 06/05/2022  Objective: AAO x3, NAD DP/PT pulses palpable bilaterally, CRT less than 3 seconds Hammertoe deformity noted to the lesser digits.  Resulting in the hyperkeratotic lesion distal aspect of the second toe.  Callus that appears to be bigger today with some dried blood but there is no skin breakdown on the second toe.  Also promised debridement of the first metatarsal with a preulcerative lesion with dried blood but no skin breakdown.  There is no edema, erythema or signs of infection. No pain with calf compression, swelling, warmth, erythema   Assessment: 76 year old male with improved cellulitis left second toe  Plan: -All treatment options discussed with the patient including all alternatives, risks, complications. -She will be debrided the preulcerative calluses x2 left without any complications or bleeding.  We need to continue working on offloading.  Continue offloading at all times.  Monitor closely for any signs or symptoms of infection or any further skin breakdown.  [name]

## 2022-08-15 ENCOUNTER — Other Ambulatory Visit: Payer: Self-pay | Admitting: Physician Assistant

## 2022-08-21 ENCOUNTER — Other Ambulatory Visit: Payer: Self-pay | Admitting: *Deleted

## 2022-08-21 DIAGNOSIS — I739 Peripheral vascular disease, unspecified: Secondary | ICD-10-CM

## 2022-08-21 DIAGNOSIS — I771 Stricture of artery: Secondary | ICD-10-CM

## 2022-08-28 ENCOUNTER — Ambulatory Visit (INDEPENDENT_AMBULATORY_CARE_PROVIDER_SITE_OTHER): Payer: Medicare Other | Admitting: *Deleted

## 2022-08-28 DIAGNOSIS — Z0000 Encounter for general adult medical examination without abnormal findings: Secondary | ICD-10-CM | POA: Diagnosis not present
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
## 2022-08-28 NOTE — Patient Instructions (Signed)
[name] , Thank you for taking time to come for your Medicare Wellness Visit. I appreciate your ongoing commitment to your health goals. Please review the following plan we discussed and let me know if I can assist you in the future.   These are the goals we discussed:  Goals      Increase physical activity     Patient Stated     Maintain current goal of being physically active.         This is a list of the screening recommended for you and due dates:  Health Maintenance  Topic Date Due   Zoster (Shingles) Vaccine (1 of 2) Never done   Colon Cancer Screening  05/27/2016   Stool Blood Test  08/05/2017   Eye exam for diabetics  04/20/2022   Complete foot exam   05/29/2022   Hemoglobin A1C  12/06/2022   Yearly kidney health urinalysis for diabetes  01/31/2023   Yearly kidney function blood test for diabetes  06/06/2023   Medicare Annual Wellness Visit  09/28/2023   Tetanus Vaccine  07/24/2028   Pneumonia Vaccine  Completed   Flu Shot  Completed   Hepatitis C Screening: USPSTF Recommendation to screen - Ages 18-79 yo.  Completed   HPV Vaccine  Aged Out   COVID-19 Vaccine  Discontinued     Next appointment: Follow up in one year for your annual wellness visit.   Preventive Care 75 Years and Older, Male Preventive care refers to lifestyle choices and visits with your health care provider that can promote health and wellness. What does preventive care include? A yearly physical exam. This is also called an annual well check. Dental exams once or twice a year. Routine eye exams. Ask your health care provider how often you should have your eyes checked. Personal lifestyle choices, including: Daily care of your teeth and gums. Regular physical activity. Eating a healthy diet. Avoiding tobacco and drug use. Limiting alcohol use. Practicing safe sex. Taking low doses of aspirin every day. Taking vitamin and mineral supplements as recommended by your health care  provider. What happens during an annual well check? The services and screenings done by your health care provider during your annual well check will depend on your age, overall health, lifestyle risk factors, and family history of disease. Counseling  Your health care provider may ask you questions about your: Alcohol use. Tobacco use. Drug use. Emotional well-being. Home and relationship well-being. Sexual activity. Eating habits. History of falls. Memory and ability to understand (cognition). Work and work Statistician. Screening  You may have the following tests or measurements: Height, weight, and BMI. Blood pressure. Lipid and cholesterol levels. These may be checked every 5 years, or more frequently if you are over 8 years old. Skin check. Lung cancer screening. You may have this screening every year starting at age 44 if you have a 30-pack-year history of smoking and currently smoke or have quit within the past 15 years. Fecal occult blood test (FOBT) of the stool. You may have this test every year starting at age 28. Flexible sigmoidoscopy or colonoscopy. You may have a sigmoidoscopy every 5 years or a colonoscopy every 10 years starting at age 65. Prostate cancer screening. Recommendations will vary depending on your family history and other risks. Hepatitis C blood test. Hepatitis B blood test. Sexually transmitted disease (STD) testing. Diabetes screening. This is done by checking your blood sugar (glucose) after you have not eaten for a while (fasting). You may  have this done every 1-3 years. Abdominal aortic aneurysm (AAA) screening. You may need this if you are a current or former smoker. Osteoporosis. You may be screened starting at age 38 if you are at high risk. Talk with your health care provider about your test results, treatment options, and if necessary, the need for more tests. Vaccines  Your health care provider may recommend certain vaccines, such  as: Influenza vaccine. This is recommended every year. Tetanus, diphtheria, and acellular pertussis (Tdap, Td) vaccine. You may need a Td booster every 10 years. Zoster vaccine. You may need this after age 86. Pneumococcal 13-valent conjugate (PCV13) vaccine. One dose is recommended after age 60. Pneumococcal polysaccharide (PPSV23) vaccine. One dose is recommended after age 91. Talk to your health care provider about which screenings and vaccines you need and how often you need them. This information is not intended to replace advice given to you by your health care provider. Make sure you discuss any questions you have with your health care provider. Document Released: 11/18/2015 Document Revised: 07/11/2016 Document Reviewed: 08/23/2015 Elsevier Interactive Patient Education  2017 [place] Prevention in the Home Falls can cause injuries. They can happen to people of all ages. There are many things you can do to make your home safe and to help prevent falls. What can I do on the outside of my home? Regularly fix the edges of walkways and driveways and fix any cracks. Remove anything that might make you trip as you walk through a door, such as a raised step or threshold. Trim any bushes or trees on the path to your home. Use bright outdoor lighting. Clear any walking paths of anything that might make someone trip, such as rocks or tools. Regularly check to see if handrails are loose or broken. Make sure that both sides of any steps have handrails. Any raised decks and porches should have guardrails on the edges. Have any leaves, snow, or ice cleared regularly. Use sand or salt on walking paths during winter. Clean up any spills in your garage right away. This includes oil or grease spills. What can I do in the bathroom? Use night lights. Install grab bars by the toilet and in the tub and shower. Do not use towel bars as grab bars. Use non-skid mats or decals in the tub or  shower. If you need to sit down in the shower, use a plastic, non-slip stool. Keep the floor dry. Clean up any water that spills on the floor as soon as it happens. Remove soap buildup in the tub or shower regularly. Attach bath mats securely with double-sided non-slip rug tape. Do not have throw rugs and other things on the floor that can make you trip. What can I do in the bedroom? Use night lights. Make sure that you have a light by your bed that is easy to reach. Do not use any sheets or blankets that are too big for your bed. They should not hang down onto the floor. Have a firm chair that has side arms. You can use this for support while you get dressed. Do not have throw rugs and other things on the floor that can make you trip. What can I do in the kitchen? Clean up any spills right away. Avoid walking on wet floors. Keep items that you use a lot in easy-to-reach places. If you need to reach something above you, use a strong step stool that has a grab bar. Keep electrical cords  out of the way. Do not use floor polish or wax that makes floors slippery. If you must use wax, use non-skid floor wax. Do not have throw rugs and other things on the floor that can make you trip. What can I do with my stairs? Do not leave any items on the stairs. Make sure that there are handrails on both sides of the stairs and use them. Fix handrails that are broken or loose. Make sure that handrails are as long as the stairways. Check any carpeting to make sure that it is firmly attached to the stairs. Fix any carpet that is loose or worn. Avoid having throw rugs at the top or bottom of the stairs. If you do have throw rugs, attach them to the floor with carpet tape. Make sure that you have a light switch at the top of the stairs and the bottom of the stairs. If you do not have them, ask someone to add them for you. What else can I do to help prevent falls? Wear shoes that: Do not have high heels. Have  rubber bottoms. Are comfortable and fit you well. Are closed at the toe. Do not wear sandals. If you use a stepladder: Make sure that it is fully opened. Do not climb a closed stepladder. Make sure that both sides of the stepladder are locked into place. Ask someone to hold it for you, if possible. Clearly mark and make sure that you can see: Any grab bars or handrails. First and last steps. Where the edge of each step is. Use tools that help you move around (mobility aids) if they are needed. These include: Canes. Walkers. Scooters. Crutches. Turn on the lights when you go into a dark area. Replace any light bulbs as soon as they burn out. Set up your furniture so you have a clear path. Avoid moving your furniture around. If any of your floors are uneven, fix them. If there are any pets around you, be aware of where they are. Review your medicines with your doctor. Some medicines can make you feel dizzy. This can increase your chance of falling. Ask your doctor what other things that you can do to help prevent falls. This information is not intended to replace advice given to you by your health care provider. Make sure you discuss any questions you have with your health care provider. Document Released: 08/18/2009 Document Revised: 03/29/2016 Document Reviewed: 11/26/2014 Elsevier Interactive Patient Education  2017 [employer].

## 2022-09-02 ENCOUNTER — Encounter: Payer: Self-pay | Admitting: Family Medicine

## 2022-09-03 MED ORDER — ALPRAZOLAM 0.5 MG PO TABS: 0.5000 mg | ORAL_TABLET | Freq: Two times a day (BID) | ORAL | 1 refills | Status: DC | PRN

## 2022-09-03 NOTE — Telephone Encounter (Signed)
Requesting: alprazolam 0.'5mg'$   Contract: 06/05/22 UDS: 01/30/22 Last Visit: 07/17/22 w/ Lenna Sciara Next Visit: 09/18/22 Last Refill: 06/26/22 #30 and 1RF  Please Advise

## 2022-09-04 ENCOUNTER — Ambulatory Visit: Payer: Medicare Other | Admitting: Vascular Surgery

## 2022-09-04 ENCOUNTER — Ambulatory Visit (INDEPENDENT_AMBULATORY_CARE_PROVIDER_SITE_OTHER)
Admission: RE | Admit: 2022-09-04 | Discharge: 2022-09-04 | Disposition: A | Discharge status: Home / Self Care | Payer: Medicare Other | Source: Ambulatory Visit | Attending: Vascular Surgery | Admitting: Vascular Surgery

## 2022-09-04 ENCOUNTER — Ambulatory Visit: Payer: Medicare Other | Admitting: Podiatry

## 2022-09-04 ENCOUNTER — Encounter: Payer: Self-pay | Admitting: Vascular Surgery

## 2022-09-04 ENCOUNTER — Ambulatory Visit (HOSPITAL_COMMUNITY)
Admission: RE | Admit: 2022-09-04 | Discharge: 2022-09-04 | Disposition: A | Discharge status: Home / Self Care | Payer: Medicare Other | Source: Ambulatory Visit | Attending: Vascular Surgery | Admitting: Vascular Surgery

## 2022-09-04 VITALS — BP 170/82 | HR 65 | Temp 97.6°F | Resp 16 | Ht 69.0 in | Wt 250.0 lb

## 2022-09-04 DIAGNOSIS — I771 Stricture of artery: Secondary | ICD-10-CM

## 2022-09-04 DIAGNOSIS — I739 Peripheral vascular disease, unspecified: Secondary | ICD-10-CM

## 2022-09-04 DIAGNOSIS — I7143 Infrarenal abdominal aortic aneurysm, without rupture: Secondary | ICD-10-CM | POA: Diagnosis not present
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
## 2022-09-07 ENCOUNTER — Ambulatory Visit: Payer: Medicare Other | Admitting: Podiatry

## 2022-09-07 DIAGNOSIS — L84 Corns and callosities: Secondary | ICD-10-CM

## 2022-09-07 DIAGNOSIS — E11621 Type 2 diabetes mellitus with foot ulcer: Secondary | ICD-10-CM

## 2022-09-07 DIAGNOSIS — L97529 Non-pressure chronic ulcer of other part of left foot with unspecified severity: Secondary | ICD-10-CM

## 2022-09-07 DIAGNOSIS — M2042 Other hammer toe(s) (acquired), left foot: Secondary | ICD-10-CM | POA: Diagnosis not present

## 2022-09-09 NOTE — Progress Notes (Signed)
Subjective: Chief Complaint  Patient presents with   Diabetic Ulcer    Diabetic left foot ulcer, 2nd toe, A1c- 8.43    76 year old male presents the office today with his wife for follow-up evaluation of wound on his left second toe and for preulcerative area submetatarsal 1.  His wife has been continue to bandage the toe to help hold it up.  Still using Iodosorb on the wound.  Denies any drainage or pus.  No pain although he has neuropathy.  No fevers or chills.  No other concerns.   Last A1c 7.8 on 06/05/2022  Objective: AAO x3, NAD DP/PT pulses palpable bilaterally, CRT less than 3 seconds Hammertoe deformity noted to the lesser digits.  This resulted in hyperkeratotic lesion with dried blood present along the distal aspect of the second toe upon debridement no ongoing ulceration drainage or signs of infection.  Also preulcerative lesion noted on the debridement of the first metatarsal again with dried blood and callus but no ulceration noted and there is no drainage or pus.  No signs of infection. No pain with calf compression, swelling, warmth, erythema   Assessment: 76 year old male with preulcerative lesions  Plan: -All treatment options discussed with the patient including all alternatives, risks, complications. -She will be debrided the preulcerative calluses x2 left without any complications or bleeding.  We need to continue working on offloading.  Continue offloading at all times.  Discussed surgical intervention with currently no open ulcerations.  He is to monitor closely.  Monitor closely for any signs or symptoms of infection or any further skin breakdown.  [name]

## 2022-09-14 DIAGNOSIS — M5441 Lumbago with sciatica, right side: Secondary | ICD-10-CM | POA: Diagnosis not present

## 2022-09-16 ENCOUNTER — Encounter: Payer: Self-pay | Admitting: Family Medicine

## 2022-09-17 ENCOUNTER — Other Ambulatory Visit: Payer: Self-pay | Admitting: Family Medicine

## 2022-09-17 MED ORDER — ALPRAZOLAM 0.5 MG PO TABS
0.5000 mg | ORAL_TABLET | Freq: Two times a day (BID) | ORAL | 1 refills | Status: DC | PRN
Start: 2022-09-17 — End: 2022-12-19

## 2022-09-17 NOTE — Assessment & Plan Note (Signed)
hgba1c acceptable, minimize simple carbs. Increase exercise as tolerated. Continue current meds 

## 2022-09-17 NOTE — Assessment & Plan Note (Signed)
Tolerating statin, encouraged heart healthy diet, avoid trans fats, minimize simple carbs and saturated fats. Increase exercise as tolerated 

## 2022-09-17 NOTE — Assessment & Plan Note (Signed)
Hydrate and monitor 

## 2022-09-17 NOTE — Assessment & Plan Note (Addendum)
On Fluoxetine 40 mg daily

## 2022-09-18 ENCOUNTER — Ambulatory Visit (INDEPENDENT_AMBULATORY_CARE_PROVIDER_SITE_OTHER): Payer: Medicare Other | Admitting: Family Medicine

## 2022-09-18 VITALS — BP 140/80 | HR 68 | Temp 97.8°F | Resp 16 | Ht 69.0 in | Wt 249.8 lb

## 2022-09-18 DIAGNOSIS — I10 Essential (primary) hypertension: Secondary | ICD-10-CM | POA: Diagnosis not present

## 2022-09-18 DIAGNOSIS — E1169 Type 2 diabetes mellitus with other specified complication: Secondary | ICD-10-CM

## 2022-09-18 DIAGNOSIS — E1149 Type 2 diabetes mellitus with other diabetic neurological complication: Secondary | ICD-10-CM | POA: Diagnosis not present

## 2022-09-18 DIAGNOSIS — M549 Dorsalgia, unspecified: Secondary | ICD-10-CM | POA: Diagnosis not present

## 2022-09-18 DIAGNOSIS — F418 Other specified anxiety disorders: Secondary | ICD-10-CM

## 2022-09-18 DIAGNOSIS — E785 Hyperlipidemia, unspecified: Secondary | ICD-10-CM

## 2022-09-18 DIAGNOSIS — N289 Disorder of kidney and ureter, unspecified: Secondary | ICD-10-CM

## 2022-09-18 DIAGNOSIS — M1A9XX0 Chronic gout, unspecified, without tophus (tophi): Secondary | ICD-10-CM

## 2022-09-18 LAB — CBC
HCT: 44.4 % (ref 39.0–52.0)
Hemoglobin: 14.8 g/dL (ref 13.0–17.0)
MCHC: 33.3 g/dL (ref 30.0–36.0)
MCV: 98.3 fl (ref 78.0–100.0)
Platelets: 168 10*3/uL (ref 150.0–400.0)
RBC: 4.52 Mil/uL (ref 4.22–5.81)
RDW: 14.6 % (ref 11.5–15.5)
WBC: 6.2 10*3/uL (ref 4.0–10.5)

## 2022-09-18 LAB — LIPID PANEL
Cholesterol: 127 mg/dL (ref 0–200)
HDL: 32.7 mg/dL — ABNORMAL LOW (ref >39.00)
LDL Cholesterol: 58 mg/dL (ref 0–99)
NonHDL: 94.2
Total CHOL/HDL Ratio: 4
Triglycerides: 182 mg/dL — ABNORMAL HIGH (ref 0.0–149.0)
VLDL: 36.4 mg/dL (ref 0.0–40.0)

## 2022-09-18 LAB — COMPREHENSIVE METABOLIC PANEL
ALT: 27 U/L (ref 0–53)
AST: 19 U/L (ref 0–37)
Albumin: 4.3 g/dL (ref 3.5–5.2)
Alkaline Phosphatase: 70 U/L (ref 39–117)
BUN: 21 mg/dL (ref 6–23)
CO2: 33 mEq/L — ABNORMAL HIGH (ref 19–32)
Calcium: 9.4 mg/dL (ref 8.4–10.5)
Chloride: 101 mEq/L (ref 96–112)
Creatinine, Ser: 0.86 mg/dL (ref 0.40–1.50)
GFR: 84.95 mL/min (ref >60.00)
Glucose, Bld: 126 mg/dL — ABNORMAL HIGH (ref 70–99)
Potassium: 4.8 mEq/L (ref 3.5–5.1)
Sodium: 138 mEq/L (ref 135–145)
Total Bilirubin: 0.6 mg/dL (ref 0.2–1.2)
Total Protein: 6.5 g/dL (ref 6.0–8.3)

## 2022-09-18 LAB — HEMOGLOBIN A1C: Hgb A1c MFr Bld: 6.6 % — ABNORMAL HIGH (ref 4.6–6.5)

## 2022-09-18 LAB — TSH: TSH: 1.68 u[IU]/mL (ref 0.35–5.50)

## 2022-09-18 LAB — URIC ACID: Uric Acid, Serum: 5.1 mg/dL (ref 4.0–7.8)

## 2022-09-18 MED ORDER — FLUOXETINE HCL 20 MG PO TABS: 60.0000 mg | ORAL_TABLET | Freq: Every day | ORAL | 3 refills | Status: DC

## 2022-09-18 NOTE — Assessment & Plan Note (Signed)
Not using Meloxicam, continue to use Tylenol.

## 2022-09-18 NOTE — Patient Instructions (Signed)
RSV (respiratory syncitial virus) vaccine at pharmacy, Arexvy Covid booster if willing at pharmacy High dose flu shot  Shingrix is the new shingles shot, 2 shots over 2-6 months, confirm coverage with insurance and document, then can return here for shots with nurse appt or at pharmacy
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
## 2022-09-24 ENCOUNTER — Encounter: Payer: Self-pay | Admitting: Family Medicine

## 2022-09-26 ENCOUNTER — Other Ambulatory Visit: Payer: Self-pay

## 2022-09-26 ENCOUNTER — Other Ambulatory Visit: Payer: Self-pay | Admitting: Family Medicine

## 2022-09-26 NOTE — Telephone Encounter (Signed)
FLUoxetine (PROZAC) 20 MG tablet      Sig: Take 3 tablets (60 mg total) by mouth daily.      Dispense:  90 tablet      Refill:  3   09/18/22 Visit

## 2022-09-28 ENCOUNTER — Other Ambulatory Visit: Payer: Self-pay | Admitting: Family Medicine

## 2022-10-02 ENCOUNTER — Other Ambulatory Visit: Payer: Self-pay | Admitting: *Deleted

## 2022-10-02 MED ORDER — FLUOXETINE HCL 20 MG PO CAPS: 60.0000 mg | ORAL_CAPSULE | Freq: Every day | ORAL | 3 refills | Status: DC

## 2022-10-02 NOTE — Telephone Encounter (Signed)
Spoke with wife [name] and advise that we were going to send the capsules, which are usually cheaper.  Rx sent in for capsules.

## 2022-10-04 ENCOUNTER — Other Ambulatory Visit: Payer: Self-pay | Admitting: Physician Assistant

## 2022-10-04 ENCOUNTER — Other Ambulatory Visit: Payer: Self-pay | Admitting: Family Medicine

## 2022-10-09 ENCOUNTER — Ambulatory Visit (INDEPENDENT_AMBULATORY_CARE_PROVIDER_SITE_OTHER): Payer: Medicare Other | Admitting: Podiatry

## 2022-10-09 DIAGNOSIS — M2042 Other hammer toe(s) (acquired), left foot: Secondary | ICD-10-CM | POA: Diagnosis not present

## 2022-10-09 DIAGNOSIS — E11621 Type 2 diabetes mellitus with foot ulcer: Secondary | ICD-10-CM

## 2022-10-09 DIAGNOSIS — L97529 Non-pressure chronic ulcer of other part of left foot with unspecified severity: Secondary | ICD-10-CM

## 2022-10-09 DIAGNOSIS — L84 Corns and callosities: Secondary | ICD-10-CM | POA: Diagnosis not present

## 2022-10-15 ENCOUNTER — Encounter: Payer: Self-pay | Admitting: Family Medicine

## 2022-10-16 DIAGNOSIS — H401131 Primary open-angle glaucoma, bilateral, mild stage: Secondary | ICD-10-CM | POA: Diagnosis not present

## 2022-10-16 MED ORDER — PIOGLITAZONE HCL 30 MG PO TABS: 30.0000 mg | ORAL_TABLET | Freq: Every day | ORAL | 1 refills | Status: DC

## 2022-10-16 NOTE — Progress Notes (Signed)
Subjective: No chief complaint on file.    76 year old male presents the office today for evaluation of wound on his left second toe and for preulcerative area submetatarsal 1.  His wife is continue to change the bandage daily.  The plan is aware.  Not to any drainage or pus.  No swelling or redness.  Denies any fevers or chills.  Last A1c 7.8 on 06/05/2022  Objective: AAO x3, NAD DP/PT pulses palpable bilaterally, CRT less than 3 seconds Hammertoe deformity noted to the lesser digits.  This has resulted in hyperkeratotic lesion with dried blood present along the distal aspect of the second toe upon debridement no ongoing ulceration drainage or signs of infection.  Also preulcerative lesion noted on the debridement of the first metatarsal again with dried blood and callus but no ulceration noted and there is no drainage or pus.  No signs of infection. No pain with calf compression, swelling, warmth, erythema   Assessment: 76 year old male with preulcerative lesions  Plan: -All treatment options discussed with the patient including all alternatives, risks, complications. -She will be debrided the preulcerative calluses x2 left without any complications or bleeding.  We discussed both conservative as well as surgical options.  He is interested in possibly doing a partial toe amputation to remove the tip of the toe given ongoing deformity, callus.  And continue with conservative care for now but continue to consider his options.  Watch for any signs or symptoms of infection or skin breakdown.  [name]

## 2022-10-23 ENCOUNTER — Ambulatory Visit: Payer: Medicare Other | Admitting: Physician Assistant

## 2022-10-23 ENCOUNTER — Encounter: Payer: Self-pay | Admitting: Physician Assistant

## 2022-10-23 VITALS — BP 146/65 | HR 76 | Resp 20 | Ht 69.0 in | Wt 258.0 lb

## 2022-10-23 DIAGNOSIS — R413 Other amnesia: Secondary | ICD-10-CM
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
## 2022-10-23 NOTE — Patient Instructions (Signed)
It was a pleasure to see you today at our office.   Recommendations:  Follow up in 3 months  Neurocognitive testing  Continue memantine 5 mg 2 times a day  Continue B12 supplement    Whom to call:  Memory  decline, memory medications: Call our office [phone]   For psychiatric meds, mood meds: Please have your primary care physician manage these medications.   Counseling regarding caregiver distress, including caregiver depression, anxiety and issues regarding community resources, adult day care programs, adult living facilities, or memory care questions:   Feel free to contact [place], Social Worker at [phone]   For assessment of decision of mental capacity and competency:  Call Dr. Anthoney Harada, geriatric psychiatrist at [phone]  For guidance in geriatric dementia issues please call [facility] [phone]  For guidance regarding [facility] and if placement were needed at the facility, contact Arnell Asal, Social Worker tel: [phone]  If you have any severe symptoms of a stroke, or other severe issues such as confusion,severe chills or fever, etc call 911 or go to the ER as you may need to be evaluated further    RECOMMENDATIONS FOR ALL PATIENTS WITH MEMORY PROBLEMS: 1. Continue to exercise (Recommend 30 minutes of walking everyday, or 3 hours every week) 2. Increase social interactions - continue going to [place] and enjoy social gatherings with friends and family 3. Eat healthy, avoid fried foods and eat more fruits and vegetables 4. Maintain adequate blood pressure, blood sugar, and blood cholesterol level. Reducing the risk of stroke and cardiovascular disease also helps promoting better memory. 5. Avoid stressful situations. Live a simple life and avoid aggravations. Organize your time and prepare for the next day in anticipation. 6. Sleep well, avoid any interruptions of sleep and avoid any distractions in  the bedroom that may interfere with adequate sleep quality 7. Avoid sugar, avoid sweets as there is a strong link between excessive sugar intake, diabetes, and cognitive impairment We discussed the Mediterranean diet, which has been shown to help patients reduce the risk of progressive memory disorders and reduces cardiovascular risk. This includes eating fish, eat fruits and green leafy vegetables, nuts like almonds and hazelnuts, walnuts, and also use olive oil. Avoid fast foods and fried foods as much as possible. Avoid sweets and sugar as sugar use has been linked to worsening of memory function.  There is always a concern of gradual progression of memory problems. If this is the case, then we may need to adjust level of care according to patient needs. Support, both to the patient and caregiver, should then be put into place.    FALL PRECAUTIONS: Be cautious when walking. Scan the area for obstacles that may increase the risk of trips and falls. When getting up in the mornings, sit up at the edge of the bed for a few minutes before getting out of bed. Consider elevating the bed at the head end to avoid drop of blood pressure when getting up. Walk always in a well-lit room (use night lights in the walls). Avoid area rugs or power cords from appliances in the middle of the walkways. Use a walker or a cane if necessary and consider physical therapy for balance exercise. Get your eyesight checked regularly.  FINANCIAL OVERSIGHT: Supervision, especially oversight when making financial decisions or transactions is also recommended.  HOME SAFETY: Consider the safety of the kitchen when operating appliances like stoves, microwave oven, and blender. Consider having supervision and  share cooking responsibilities until no longer able to participate in those. Accidents with firearms and other hazards in the house should be identified and addressed as well.   ABILITY TO BE LEFT ALONE: If patient is unable to  contact 911 operator, consider using LifeLine, or when the need is there, arrange for someone to stay with patients. Smoking is a fire hazard, consider supervision or cessation. Risk of wandering should be assessed by caregiver and if detected at any point, supervision and safe proof recommendations should be instituted.  MEDICATION SUPERVISION: Inability to self-administer medication needs to be constantly addressed. Implement a mechanism to ensure safe administration of the medications.   DRIVING: Regarding driving, in patients with progressive memory problems, driving will be impaired. We advise to have someone else do the driving if trouble finding directions or if minor accidents are reported. Independent driving assessment is available to determine safety of driving.   If you are interested in the driving assessment, you can contact the following:  [facility] [employer] [facility] in [place]  [place] [phone]  [facility] [phone] or [phone]   We have sent a referral to [facility] for your MRI and they will call you directly to schedule your appointment. They are located at [place]. If you need to contact them directly please call [phone].   Your provider has requested that you have labwork completed today. Please go to [facility] ([id number]) on the second floor of this building before leaving the office today. You do not need to check in. If you are not called within 15 minutes please check with the front desk.

## 2022-10-26 ENCOUNTER — Other Ambulatory Visit: Payer: Self-pay | Admitting: Family Medicine

## 2022-11-09 ENCOUNTER — Ambulatory Visit: Payer: Medicare HMO | Admitting: Podiatry

## 2022-11-09 VITALS — BP 138/69 | HR 75

## 2022-11-09 DIAGNOSIS — L97529 Non-pressure chronic ulcer of other part of left foot with unspecified severity: Secondary | ICD-10-CM | POA: Diagnosis not present

## 2022-11-09 DIAGNOSIS — E11621 Type 2 diabetes mellitus with foot ulcer: Secondary | ICD-10-CM

## 2022-11-09 DIAGNOSIS — L84 Corns and callosities: Secondary | ICD-10-CM | POA: Diagnosis not present

## 2022-11-09 DIAGNOSIS — M2042 Other hammer toe(s) (acquired), left foot: Secondary | ICD-10-CM

## 2022-11-09 DIAGNOSIS — Z89419 Acquired absence of unspecified great toe: Secondary | ICD-10-CM

## 2022-11-12 NOTE — Progress Notes (Signed)
Subjective: Chief Complaint  Patient presents with   Diabetic Ulcer    Diabetic ulcer, left foot, 2nd toe is doing well, patient denies any pain,    77 year old male presents the office for above concerns.  States he is doing well.  He has not any diabetic shoes.  Denies any drainage or pus.  No swelling or redness.  Has no fevers or chills.  No other concerns today.  A1c 6.6 on September 18, 2022  Objective: AAO x3, NAD DP/PT pulses palpable bilaterally, CRT less than 3 seconds Hammertoe deformity noted to the lesser digits.  Left second toe is doing much better at this visit.  There is actually no significant callus and there is no open lesion.  On the rim of the first metatarsal plantarly there is continuation of preulcerative callus.  There are some dried blood underneath it.  The callus there is no skin breakdown.  There is no drainage or pus.  No fluctuance or crepitation there is no malodor. No pain with calf compression, swelling, warmth, erythema   Assessment: 77 year old male with preulcerative lesions  Plan: -All treatment options discussed with the patient including all alternatives, risks, complications. -Sharply debrided the preulcerative callus x 1 without any complications or bleeding.  Overall doing much better.  Continue offloading.  Measured for new diabetic shoes. -Daily foot inspection, glucose control.  [name]

## 2022-11-13 ENCOUNTER — Telehealth: Payer: Self-pay | Admitting: Family Medicine

## 2022-11-13 NOTE — Telephone Encounter (Signed)
Called pt regarding freestyle libre monitor  And pt stated his insurance changed so to hold off on paperwork

## 2022-11-13 NOTE — Telephone Encounter (Signed)
[facility] stated pt is requesting a freestyle libre monitor. Stated they faxed this information and have not heard anything back.   [facility] [address], [place] [address]

## 2022-11-16 ENCOUNTER — Other Ambulatory Visit: Payer: Self-pay

## 2022-11-16 ENCOUNTER — Other Ambulatory Visit: Payer: Self-pay | Admitting: Family Medicine

## 2022-11-16 MED ORDER — ACCU-CHEK AVIVA PLUS W/DEVICE KIT: PACK | 0 refills | Status: AC

## 2022-11-16 MED ORDER — ACCU-CHEK MULTICLIX LANCETS MISC: 12 refills | Status: AC

## 2022-11-16 MED ORDER — FREESTYLE LITE W/DEVICE KIT: PACK | 2 refills | Status: DC

## 2022-11-16 MED ORDER — ACCU-CHEK AVIVA PLUS VI STRP: ORAL_STRIP | 12 refills | Status: DC

## 2022-11-16 NOTE — Telephone Encounter (Signed)
[facility] calling to get an update on rx request. Shawn's # is [phone].

## 2022-11-16 NOTE — Telephone Encounter (Signed)
Monitor was sent

## 2022-11-18 ENCOUNTER — Other Ambulatory Visit: Payer: Self-pay | Admitting: Physician Assistant

## 2022-11-21 NOTE — Telephone Encounter (Signed)
[facility] called stating that they needed the most recent OV notes for the pt to complete the order for the monitor. Rep stated that can be sent to [phone].

## 2022-11-21 NOTE — Telephone Encounter (Signed)
Notes was faxed

## 2022-11-30 DIAGNOSIS — H401131 Primary open-angle glaucoma, bilateral, mild stage: Secondary | ICD-10-CM | POA: Diagnosis not present

## 2022-11-30 LAB — HM DIABETES EYE EXAM

## 2022-12-06 ENCOUNTER — Ambulatory Visit (INDEPENDENT_AMBULATORY_CARE_PROVIDER_SITE_OTHER): Payer: Medicare HMO | Admitting: Family Medicine

## 2022-12-06 ENCOUNTER — Encounter: Payer: Self-pay | Admitting: Family Medicine

## 2022-12-06 ENCOUNTER — Ambulatory Visit (HOSPITAL_BASED_OUTPATIENT_CLINIC_OR_DEPARTMENT_OTHER)
Admission: RE | Admit: 2022-12-06 | Discharge: 2022-12-06 | Disposition: A | Discharge status: Home / Self Care | Payer: Medicare HMO | Source: Ambulatory Visit | Attending: Family Medicine | Admitting: Family Medicine

## 2022-12-06 VITALS — BP 135/75 | HR 76 | Temp 98.0°F | Resp 16 | Ht 69.0 in | Wt 257.4 lb

## 2022-12-06 DIAGNOSIS — R059 Cough, unspecified: Secondary | ICD-10-CM | POA: Diagnosis not present

## 2022-12-06 DIAGNOSIS — M62838 Other muscle spasm: Secondary | ICD-10-CM | POA: Diagnosis not present

## 2022-12-06 DIAGNOSIS — R051 Acute cough: Secondary | ICD-10-CM | POA: Diagnosis not present

## 2022-12-06 MED ORDER — GUAIFENESIN ER 600 MG PO TB12: 1200.0000 mg | ORAL_TABLET | Freq: Two times a day (BID) | ORAL | 1 refills | Status: DC

## 2022-12-06 MED ORDER — AMOXICILLIN-POT CLAVULANATE 875-125 MG PO TABS: 1.0000 | ORAL_TABLET | Freq: Two times a day (BID) | ORAL | 0 refills | Status: DC

## 2022-12-06 MED ORDER — BENZONATATE 100 MG PO CAPS: 100.0000 mg | ORAL_CAPSULE | Freq: Two times a day (BID) | ORAL | 0 refills | Status: DC | PRN

## 2022-12-06 NOTE — Patient Instructions (Signed)
Chest xray given history of pneumonia and dyspnea with exertion Adding Augmentin - if we have to change anything based on the xray we will let you know Adding Mucinex and Tessalon  Continue supportive measures including rest, hydration, humidifier use, steam showers, warm compresses to sinuses, warm liquids with lemon and honey, and over-the-counter cough, cold, and analgesics as needed.   Please contact office for follow-up if symptoms do not improve or worsen. Seek emergency care if symptoms become severe.

## 2022-12-06 NOTE — Progress Notes (Signed)
Acute Office Visit  Subjective:     Patient ID: [name], male    DOB: 11-11-1945, 77 y.o.   MRN: [id number]  Chief Complaint  Patient presents with   sick for week in a half   Cough   Nasal Congestion   Neck Pain    Left side       Patient is in today for cough.  Patient reports he started with cold-like symptoms about 10-12 days ago. Some symptoms have improved, but cough has persisted. Cough is productive with green/yellow sputum. He has had some wheezing, fever/chills, headache, body aches, weakness and fatigue as well. Reports he feels like he is constantly coughing. This somewhat reminds him of having pneumonia in the past. No chest pain, but mild soreness from coughing. Additionally, since the worsening coughing spells he has had some left neck tightness and discomfort like a "crick in his neck." No known sick contacts. He had a negative home COVID test yesterday.       All review of systems negative except what is listed in the HPI      Objective:    BP 135/75   Pulse 76   Temp 98 F (36.7 C)   Resp 16   Ht '5\' 9"'$  (1.753 m)   Wt 257 lb 6.4 oz (116.8 kg)   SpO2 95%   BMI 38.01 kg/m    Physical Exam Vitals reviewed.  Constitutional:      Appearance: Normal appearance.  HENT:     Head: Normocephalic and atraumatic.  Cardiovascular:     Rate and Rhythm: Normal rate and regular rhythm.     Pulses: Normal pulses.     Heart sounds: Normal heart sounds.  Pulmonary:     Effort: Pulmonary effort is normal.     Breath sounds: Wheezing present.     Comments: Mildly diminished in bases Musculoskeletal:     Cervical back: Normal range of motion and neck supple.     Comments: Left upper trap tender to palpation  Skin:    General: Skin is warm and dry.  Neurological:     Mental Status: He is alert and oriented to person, place, and time.  Psychiatric:        Mood and Affect: Mood normal.        Behavior: Behavior normal.        Thought Content:  Thought content normal.        Judgment: Judgment normal.      No results found for any visits on 12/06/22.      Assessment & Plan:   Problem List Items Addressed This Visit   None Visit Diagnoses     Acute cough    -  Primary Chest xray given history of pneumonia and current dyspnea with exertion Adding Augmentin - if we have to change anything based on the xray we will let you know Adding Mucinex and Tessalon  Continue supportive measures including rest, hydration, humidifier use, steam showers, warm compresses to sinuses, warm liquids with lemon and honey, and over-the-counter cough, cold, and analgesics as needed.  States he cannot tolerated prednisone side effects - continue PRN albuterol and regular Breztri  Please contact office for follow-up if symptoms do not improve or worsen. Seek emergency care if symptoms become severe.   Relevant Medications   amoxicillin-clavulanate (AUGMENTIN) 875-125 MG tablet   guaiFENesin (MUCINEX) 600 MG 12 hr tablet   benzonatate (TESSALON) 100 MG capsule   Other Relevant Orders  DG Chest 2 View   Muscle spasms of neck     Likely from coughing spells. No alarm findings on exam. Recommend heating pad, gentle massage, stretching, occasional tylenol as needed       Meds ordered this encounter  Medications   amoxicillin-clavulanate (AUGMENTIN) 875-125 MG tablet    Sig: Take 1 tablet by mouth 2 (two) times daily.    Dispense:  20 tablet    Refill:  0    Order Specific Question:   Supervising Provider    Answer:   Penni Homans A [[id number]]   guaiFENesin (MUCINEX) 600 MG 12 hr tablet    Sig: Take 2 tablets (1,200 mg total) by mouth 2 (two) times daily.    Dispense:  30 tablet    Refill:  1    Order Specific Question:   Supervising Provider    Answer:   Penni Homans A [[id number]]   benzonatate (TESSALON) 100 MG capsule    Sig: Take 1 capsule (100 mg total) by mouth 2 (two) times daily as needed for cough.    Dispense:  20 capsule    Refill:   0    Order Specific Question:   Supervising Provider    Answer:   Penni Homans A [[id number]]    Return if symptoms worsen or fail to improve.  Terrilyn Saver, NP

## 2022-12-17 NOTE — Assessment & Plan Note (Signed)
Tolerating statin, encouraged heart healthy diet, avoid trans fats, minimize simple carbs and saturated fats. Increase exercise as tolerated 

## 2022-12-17 NOTE — Assessment & Plan Note (Signed)
On Levothyroxine, continue to monitor 

## 2022-12-17 NOTE — Assessment & Plan Note (Signed)
hgba1c acceptable, minimize simple carbs. Increase exercise as tolerated. Continue current meds 

## 2022-12-17 NOTE — Assessment & Plan Note (Signed)
Hydrate and monitor 

## 2022-12-17 NOTE — Assessment & Plan Note (Signed)
Encouraged DASH or MIND diet, decrease po intake and increase exercise as tolerated. Needs 7-8 hours of sleep nightly. Avoid trans fats, eat small, frequent meals every 4-5 hours with lean proteins, complex carbs and healthy fats. Minimize simple carbs, high fat foods and processed foods 

## 2022-12-18 ENCOUNTER — Ambulatory Visit (INDEPENDENT_AMBULATORY_CARE_PROVIDER_SITE_OTHER): Payer: Medicare HMO | Admitting: Family Medicine

## 2022-12-18 VITALS — BP 124/62 | HR 73 | Temp 97.5°F | Resp 16 | Ht 69.0 in | Wt 259.0 lb

## 2022-12-18 DIAGNOSIS — E669 Obesity, unspecified: Secondary | ICD-10-CM

## 2022-12-18 DIAGNOSIS — M1A9XX0 Chronic gout, unspecified, without tophus (tophi): Secondary | ICD-10-CM | POA: Diagnosis not present

## 2022-12-18 DIAGNOSIS — E785 Hyperlipidemia, unspecified: Secondary | ICD-10-CM | POA: Diagnosis not present

## 2022-12-18 DIAGNOSIS — Z6832 Body mass index (BMI) 32.0-32.9, adult: Secondary | ICD-10-CM | POA: Diagnosis not present

## 2022-12-18 DIAGNOSIS — E039 Hypothyroidism, unspecified: Secondary | ICD-10-CM | POA: Diagnosis not present

## 2022-12-18 DIAGNOSIS — N289 Disorder of kidney and ureter, unspecified: Secondary | ICD-10-CM | POA: Diagnosis not present

## 2022-12-18 DIAGNOSIS — E1149 Type 2 diabetes mellitus with other diabetic neurological complication: Secondary | ICD-10-CM

## 2022-12-18 DIAGNOSIS — E1169 Type 2 diabetes mellitus with other specified complication: Secondary | ICD-10-CM

## 2022-12-18 LAB — CBC WITH DIFFERENTIAL/PLATELET
Basophils Absolute: 0 10*3/uL (ref 0.0–0.1)
Basophils Relative: 0.6 % (ref 0.0–3.0)
Eosinophils Absolute: 0.2 10*3/uL (ref 0.0–0.7)
Eosinophils Relative: 3.1 % (ref 0.0–5.0)
HCT: 42.3 % (ref 39.0–52.0)
Hemoglobin: 14 g/dL (ref 13.0–17.0)
Lymphocytes Relative: 24.8 % (ref 12.0–46.0)
Lymphs Abs: 2 10*3/uL (ref 0.7–4.0)
MCHC: 33.2 g/dL (ref 30.0–36.0)
MCV: 98.8 fl (ref 78.0–100.0)
Monocytes Absolute: 0.5 10*3/uL (ref 0.1–1.0)
Monocytes Relative: 6.4 % (ref 3.0–12.0)
Neutro Abs: 5.2 10*3/uL (ref 1.4–7.7)
Neutrophils Relative %: 65.1 % (ref 43.0–77.0)
Platelets: 246 10*3/uL (ref 150.0–400.0)
RBC: 4.28 Mil/uL (ref 4.22–5.81)
RDW: 14.2 % (ref 11.5–15.5)
WBC: 8 10*3/uL (ref 4.0–10.5)

## 2022-12-18 LAB — COMPREHENSIVE METABOLIC PANEL
ALT: 24 U/L (ref 0–53)
AST: 20 U/L (ref 0–37)
Albumin: 4 g/dL (ref 3.5–5.2)
Alkaline Phosphatase: 82 U/L (ref 39–117)
BUN: 19 mg/dL (ref 6–23)
CO2: 32 mEq/L (ref 19–32)
Calcium: 9.1 mg/dL (ref 8.4–10.5)
Chloride: 100 mEq/L (ref 96–112)
Creatinine, Ser: 0.92 mg/dL (ref 0.40–1.50)
GFR: 81.47 mL/min (ref >60.00)
Glucose, Bld: 117 mg/dL — ABNORMAL HIGH (ref 70–99)
Potassium: 4.8 mEq/L (ref 3.5–5.1)
Sodium: 140 mEq/L (ref 135–145)
Total Bilirubin: 0.4 mg/dL (ref 0.2–1.2)
Total Protein: 6.6 g/dL (ref 6.0–8.3)

## 2022-12-18 LAB — LDL CHOLESTEROL, DIRECT: Direct LDL: 108 mg/dL

## 2022-12-18 LAB — URIC ACID: Uric Acid, Serum: 4.9 mg/dL (ref 4.0–7.8)

## 2022-12-18 LAB — LIPID PANEL
Cholesterol: 171 mg/dL (ref 0–200)
HDL: 34.7 mg/dL — ABNORMAL LOW
NonHDL: 135.99
Total CHOL/HDL Ratio: 5
Triglycerides: 231 mg/dL — ABNORMAL HIGH (ref 0.0–149.0)
VLDL: 46.2 mg/dL — ABNORMAL HIGH (ref 0.0–40.0)

## 2022-12-18 LAB — TSH: TSH: 2.4 u[IU]/mL (ref 0.35–5.50)

## 2022-12-18 LAB — HEMOGLOBIN A1C: Hgb A1c MFr Bld: 6.9 % — ABNORMAL HIGH (ref 4.6–6.5)

## 2022-12-18 NOTE — Patient Instructions (Signed)
Gout  Gout is a condition that causes painful swelling of the joints. Gout is a type of inflammation of the joints (arthritis). This condition is caused by having too much uric acid in the body. Uric acid is a chemical that forms when the body breaks down substances called purines. Purines are important for building body proteins. When the body has too much uric acid, sharp crystals can form and build up inside the joints. This causes pain and swelling. Gout attacks can happen quickly and may be very painful (acute gout). Over time, the attacks can affect more joints and become more frequent (chronic gout). Gout can also cause uric acid to build up under the skin and inside the kidneys. What are the causes? This condition is caused by too much uric acid in your blood. This can happen because: Your kidneys do not remove enough uric acid from your blood. This is the most common cause. Your body makes too much uric acid. This can happen with some cancers and cancer treatments. It can also occur if your body is breaking down too many red blood cells (hemolytic anemia). You eat too many foods that are high in purines. These foods include organ meats and some seafood. Alcohol, especially beer, is also high in purines. A gout attack may be triggered by trauma or stress. What increases the risk? The following factors may make you more likely to develop this condition: Having a family history of gout. Being male and middle-aged. Being male and having gone through menopause. Taking certain medicines, including aspirin, cyclosporine, diuretics, levodopa, and niacin. Having an organ transplant. Having certain conditions, such as: Being obese. Lead poisoning. Kidney disease. A skin condition called psoriasis. Other factors include: Losing weight too quickly. Being dehydrated. Frequently drinking alcohol, especially beer. Frequently drinking beverages that are sweetened with a type of sugar called  fructose. What are the signs or symptoms? An attack of acute gout happens quickly. It usually occurs in just one joint. The most common place is the big toe. Attacks often start at night. Other joints that may be affected include joints of the feet, ankle, knee, fingers, wrist, or elbow. Symptoms of this condition may include: Severe pain. Warmth. Swelling. Stiffness. Tenderness. The affected joint may be very painful to touch. Shiny, red, or purple skin. Chills and fever. Chronic gout may cause symptoms more frequently. More joints may be involved. You may also have white or yellow lumps (tophi) on your hands or feet or in other areas near your joints. How is this diagnosed? This condition is diagnosed based on your symptoms, your medical history, and a physical exam. You may have tests, such as: Blood tests to measure uric acid levels. Removal of joint fluid with a thin needle (aspiration) to look for uric acid crystals. X-rays to look for joint damage. How is this treated? Treatment for this condition has two phases: treating an acute attack and preventing future attacks. Acute gout treatment may include medicines to reduce pain and swelling, including: NSAIDs, such as ibuprofen. Steroids. These are strong anti-inflammatory medicines that can be taken by mouth (orally) or injected into a joint. Colchicine. This medicine relieves pain and swelling when it is taken soon after an attack. It can be given by mouth or through an IV. Preventive treatment may include: Daily use of smaller doses of NSAIDs or colchicine. Use of a medicine that reduces uric acid levels in your blood, such as allopurinol. Changes to your diet. You may need to see   a dietitian about what to eat and drink to prevent gout. Follow these instructions at home: During a gout attack  If directed, put ice on the affected area. To do this: Put ice in a plastic bag. Place a towel between your skin and the bag. Leave the  ice on for 20 minutes, 2-3 times a day. Remove the ice if your skin turns bright red. This is very important. If you cannot feel pain, heat, or cold, you have a greater risk of damage to the area. Raise (elevate) the affected joint above the level of your heart as often as possible. Rest the joint as much as possible. If the affected joint is in your leg, you may be given crutches to use. Follow instructions from your health care provider about eating or drinking restrictions. Avoiding future gout attacks Follow a low-purine diet as told by your dietitian or health care provider. Avoid foods and drinks that are high in purines, including liver, kidney, anchovies, asparagus, herring, mushrooms, mussels, and beer. Maintain a healthy weight or lose weight if you are overweight. If you want to lose weight, talk with your health care provider. Do not lose weight too quickly. Start or maintain an exercise program as told by your health care provider. Eating and drinking Avoid drinking beverages that contain fructose. Drink enough fluids to keep your urine pale yellow. If you drink alcohol: Limit how much you have to: 0-1 drink a day for women who are not pregnant. 0-2 drinks a day for men. Know how much alcohol is in a drink. In the [place] one drink equals one 12 oz bottle of beer (355 mL), one 5 oz glass of wine (148 mL), or one 1 oz glass of hard liquor (44 mL). General instructions Take over-the-counter and prescription medicines only as told by your health care provider. Ask your health care provider if the medicine prescribed to you requires you to avoid driving or using machinery. Return to your normal activities as told by your health care provider. Ask your health care provider what activities are safe for you. Keep all follow-up visits. This is important. Where to find more information [employer] of [employer]: www.niams.nih.gov Contact a health care provider if you have: Another  gout attack. Continuing symptoms of a gout attack after 10 days of treatment. Side effects from your medicines. Chills or a fever. Burning pain when you urinate. Pain in your lower back or abdomen. Get help right away if you: Have severe or uncontrolled pain. Cannot urinate. Summary Gout is painful swelling of the joints caused by having too much uric acid in the body. The most common site for gout to occur is in the big toe, but it can affect other joints in the body. Medicines and dietary changes can help to prevent and treat gout attacks. This information is not intended to replace advice given to you by your health care provider. Make sure you discuss any questions you have with your health care provider. Document Revised: 07/26/2021 Document Reviewed: 07/26/2021 [employer] Patient Education  2023 [employer]  
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
## 2022-12-19 ENCOUNTER — Other Ambulatory Visit: Payer: Self-pay | Admitting: Family Medicine

## 2022-12-19 ENCOUNTER — Other Ambulatory Visit: Payer: Self-pay

## 2022-12-19 MED ORDER — EZETIMIBE 10 MG PO TABS: 10.0000 mg | ORAL_TABLET | Freq: Every day | ORAL | 3 refills | Status: DC

## 2022-12-19 NOTE — Telephone Encounter (Signed)
Requesting: alprazolam 0.75m  Contract: 06/05/22 UDS: 01/30/22 Last Visit: 12/18/22 Next Visit: 04/23/23 Last Refill: 09/17/22 #30 and 1RF   Please Advise

## 2022-12-24 ENCOUNTER — Encounter: Payer: Self-pay | Admitting: Family Medicine

## 2022-12-24 DIAGNOSIS — H401121 Primary open-angle glaucoma, left eye, mild stage: Secondary | ICD-10-CM | POA: Diagnosis not present

## 2023-01-01 ENCOUNTER — Ambulatory Visit: Payer: Medicare HMO | Admitting: Podiatry

## 2023-01-01 DIAGNOSIS — L84 Corns and callosities: Secondary | ICD-10-CM

## 2023-01-01 DIAGNOSIS — M2042 Other hammer toe(s) (acquired), left foot: Secondary | ICD-10-CM | POA: Diagnosis not present

## 2023-01-01 DIAGNOSIS — L97522 Non-pressure chronic ulcer of other part of left foot with fat layer exposed: Secondary | ICD-10-CM | POA: Diagnosis not present

## 2023-01-01 NOTE — Progress Notes (Signed)
Subjective: Chief Complaint  Patient presents with   Pre-Ulcerative Calluses    Rm 12 Follow up left foot ulcer. Pt states no improvement and he now has a sore that appeared on his left 2nd toe 1 week ago.    Wore a different pair shoes and rubbed the top of the 2nd toe, but the original wounds are doing well.  Does not see any drainage or pus.  His wife has been changing the dressing daily on the left second toe.  Denies any increase swelling redness.  No fevers or chills.  Objective: AAO x3, NAD DP/PT pulses palpable bilaterally, CRT less than 3 seconds Hammertoe contractures are present.  The original wound at the distal aspect of the second toe as well as submetatarsal 1 is doing much better and only minimal callus remains.  There is a new wound present in the dorsal aspect of the left second PIPJ measuring 0.8 x 0.4 x 0.1 cm.  There is no fluctuance or crepitus and there is no exposed bone or tendon.  No pain with calf compression, swelling, warmth, erythema  Assessment: 77 year old male with second toe ulceration from wearing new shoes; hammertoes  Plan: -All treatment options discussed with the patient including all alternatives, risks, complications.  -X-rays were obtained and reviewed.  3 views of the foot were obtained.  Hammertoes are present.  Previous partial first amputation.  Arthritic changes are noted.  Evidence of acute osteomyelitis. -Debrided the wound today.  Recommended [place] dressing changes daily with reapplied today.  He is offloaded at all times.  Surgical shoe which he has at home is recommended. -Monitor for any clinical signs or symptoms of infection and directed to call the office immediately should any occur or go to the ER.   [name]

## 2023-01-06 ENCOUNTER — Encounter: Payer: Self-pay | Admitting: Family Medicine

## 2023-01-07 ENCOUNTER — Other Ambulatory Visit: Payer: Self-pay | Admitting: Family Medicine

## 2023-01-07 MED ORDER — PREGABALIN 150 MG PO CAPS: 150.0000 mg | ORAL_CAPSULE | Freq: Two times a day (BID) | ORAL | 1 refills | Status: DC

## 2023-01-07 NOTE — Telephone Encounter (Signed)
Requesting: Lyrica '150mg'$   Contract: 06/05/22 UDS: 01/30/22 Last Visit: 12/18/22 Next Visit: 04/23/23 Last Refill: 05/15/22 #180 and 1RF   Please Advise

## 2023-01-08 ENCOUNTER — Ambulatory Visit: Payer: Medicare Other | Admitting: Dermatology

## 2023-01-09 ENCOUNTER — Encounter: Payer: Self-pay | Admitting: Family Medicine

## 2023-01-10 NOTE — Telephone Encounter (Signed)
Called pt was advised and pt stated understand,Pt was advised will need visit for power wheelchair to discuss and document. Will need to pick which company they want to deal with. Pt stated he has appt in June.

## 2023-01-21 ENCOUNTER — Encounter: Payer: Self-pay | Admitting: Family Medicine

## 2023-01-22 ENCOUNTER — Ambulatory Visit (HOSPITAL_BASED_OUTPATIENT_CLINIC_OR_DEPARTMENT_OTHER)
Admission: RE | Admit: 2023-01-22 | Discharge: 2023-01-22 | Disposition: A | Discharge status: Home / Self Care | Payer: Medicare HMO | Source: Ambulatory Visit | Attending: Family Medicine | Admitting: Family Medicine

## 2023-01-22 ENCOUNTER — Ambulatory Visit: Payer: Medicare Other | Admitting: Physician Assistant

## 2023-01-22 ENCOUNTER — Ambulatory Visit (INDEPENDENT_AMBULATORY_CARE_PROVIDER_SITE_OTHER): Payer: Medicare HMO | Admitting: Family Medicine

## 2023-01-22 VITALS — BP 128/78 | HR 82 | Temp 98.3°F | Resp 16 | Ht 69.0 in | Wt 259.0 lb

## 2023-01-22 DIAGNOSIS — J020 Streptococcal pharyngitis: Secondary | ICD-10-CM | POA: Diagnosis not present

## 2023-01-22 DIAGNOSIS — J432 Centrilobular emphysema: Secondary | ICD-10-CM | POA: Insufficient documentation

## 2023-01-22 DIAGNOSIS — E1149 Type 2 diabetes mellitus with other diabetic neurological complication: Secondary | ICD-10-CM | POA: Diagnosis not present

## 2023-01-22 DIAGNOSIS — N289 Disorder of kidney and ureter, unspecified: Secondary | ICD-10-CM | POA: Diagnosis not present

## 2023-01-22 DIAGNOSIS — J441 Chronic obstructive pulmonary disease with (acute) exacerbation: Secondary | ICD-10-CM | POA: Diagnosis not present

## 2023-01-22 DIAGNOSIS — H65191 Other acute nonsuppurative otitis media, right ear: Secondary | ICD-10-CM | POA: Diagnosis not present

## 2023-01-22 DIAGNOSIS — R059 Cough, unspecified: Secondary | ICD-10-CM | POA: Diagnosis not present

## 2023-01-22 LAB — COMPREHENSIVE METABOLIC PANEL
ALT: 27 U/L (ref 0–53)
AST: 21 U/L (ref 0–37)
Albumin: 4.1 g/dL (ref 3.5–5.2)
Alkaline Phosphatase: 85 U/L (ref 39–117)
BUN: 13 mg/dL (ref 6–23)
CO2: 31 mEq/L (ref 19–32)
Calcium: 9.3 mg/dL (ref 8.4–10.5)
Chloride: 97 mEq/L (ref 96–112)
Creatinine, Ser: 0.77 mg/dL (ref 0.40–1.50)
GFR: 87.62 mL/min (ref >60.00)
Glucose, Bld: 105 mg/dL — ABNORMAL HIGH (ref 70–99)
Potassium: 4.2 mEq/L (ref 3.5–5.1)
Sodium: 140 mEq/L (ref 135–145)
Total Bilirubin: 0.6 mg/dL (ref 0.2–1.2)
Total Protein: 6.6 g/dL (ref 6.0–8.3)

## 2023-01-22 LAB — CBC WITH DIFFERENTIAL/PLATELET
Basophils Absolute: 0 10*3/uL (ref 0.0–0.1)
Basophils Relative: 0.5 % (ref 0.0–3.0)
Eosinophils Absolute: 0.1 10*3/uL (ref 0.0–0.7)
Eosinophils Relative: 0.6 % (ref 0.0–5.0)
HCT: 43.6 % (ref 39.0–52.0)
Hemoglobin: 14.7 g/dL (ref 13.0–17.0)
Lymphocytes Relative: 17.8 % (ref 12.0–46.0)
Lymphs Abs: 1.6 10*3/uL (ref 0.7–4.0)
MCHC: 33.7 g/dL (ref 30.0–36.0)
MCV: 97.6 fl (ref 78.0–100.0)
Monocytes Absolute: 0.9 10*3/uL (ref 0.1–1.0)
Monocytes Relative: 10.2 % (ref 3.0–12.0)
Neutro Abs: 6.4 10*3/uL (ref 1.4–7.7)
Neutrophils Relative %: 70.9 % (ref 43.0–77.0)
Platelets: 178 10*3/uL (ref 150.0–400.0)
RBC: 4.47 Mil/uL (ref 4.22–5.81)
RDW: 14.4 % (ref 11.5–15.5)
WBC: 9 10*3/uL (ref 4.0–10.5)

## 2023-01-22 LAB — POC COVID19 BINAXNOW: SARS Coronavirus 2 Ag: NEGATIVE

## 2023-01-22 LAB — POCT INFLUENZA A/B
Influenza A, POC: NEGATIVE
Influenza B, POC: NEGATIVE

## 2023-01-22 LAB — POCT RAPID STREP A (OFFICE): Rapid Strep A Screen: POSITIVE — AB

## 2023-01-22 MED ORDER — BENZONATATE 100 MG PO CAPS: 100.0000 mg | ORAL_CAPSULE | Freq: Three times a day (TID) | ORAL | 1 refills | Status: DC | PRN

## 2023-01-22 MED ORDER — CEFDINIR 300 MG PO CAPS: 300.0000 mg | ORAL_CAPSULE | Freq: Two times a day (BID) | ORAL | 0 refills | Status: AC

## 2023-01-22 NOTE — Assessment & Plan Note (Signed)
Notes right ear pain and very erythematous started on Cefdinir.

## 2023-01-22 NOTE — Assessment & Plan Note (Signed)
He has been ill for roughly a week and is noting f/c/malaise/weakness/cough/congestion/sob. Started on Cefdinir and tessalon perles prn. Sent for lab work and cxr. Seek care if worsens. Monitor oxygen levels and seek care if oxygen drops

## 2023-01-22 NOTE — Assessment & Plan Note (Signed)
Illness started with sore throat a week ago and he did have strep exposure from his grandkids. Will start on Cefdinir

## 2023-01-22 NOTE — Patient Instructions (Signed)

## 2023-01-22 NOTE — Telephone Encounter (Signed)
Appt today w/ PCP ?

## 2023-01-22 NOTE — Assessment & Plan Note (Signed)
hgba1c acceptable, minimize simple carbs. Increase exercise as tolerated. Continue current meds 
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
## 2023-01-23 ENCOUNTER — Encounter: Payer: Self-pay | Admitting: Family Medicine

## 2023-01-24 ENCOUNTER — Ambulatory Visit: Payer: Medicare HMO | Admitting: Podiatry

## 2023-01-24 NOTE — Telephone Encounter (Signed)
Called pt spoke with wife and  She stated no trouble breathing, hasn't worsen at all. Pt wife wanted to know about Chest x-ray was advised I will call when get reports.

## 2023-01-25 ENCOUNTER — Ambulatory Visit: Payer: Medicare HMO | Admitting: Podiatry

## 2023-01-25 ENCOUNTER — Encounter: Payer: Self-pay | Admitting: Family Medicine

## 2023-01-28 ENCOUNTER — Other Ambulatory Visit: Payer: Self-pay | Admitting: Family Medicine

## 2023-01-28 MED ORDER — ALBUTEROL SULFATE (2.5 MG/3ML) 0.083% IN NEBU: 2.5000 mg | INHALATION_SOLUTION | Freq: Four times a day (QID) | RESPIRATORY_TRACT | 1 refills | Status: DC | PRN

## 2023-01-30 ENCOUNTER — Telehealth: Payer: Self-pay | Admitting: Podiatry

## 2023-01-30 NOTE — Telephone Encounter (Signed)
Pts wife called to r/s appt that was canceled due to pt had to work.   I offered several appts and they could not make it. I have him scheduled for 4.26.2024. They need a Tuesday or Friday appt as pt works Mondays, Wednesdays and Thursdays.

## 2023-02-03 ENCOUNTER — Other Ambulatory Visit: Payer: Self-pay | Admitting: Family Medicine

## 2023-02-04 ENCOUNTER — Other Ambulatory Visit: Payer: Self-pay | Admitting: Family Medicine

## 2023-02-04 ENCOUNTER — Other Ambulatory Visit: Payer: Self-pay | Admitting: Physician Assistant

## 2023-02-04 ENCOUNTER — Other Ambulatory Visit: Payer: Self-pay | Admitting: Family

## 2023-02-04 DIAGNOSIS — M1990 Unspecified osteoarthritis, unspecified site: Secondary | ICD-10-CM

## 2023-02-05 ENCOUNTER — Other Ambulatory Visit: Payer: Self-pay | Admitting: Family Medicine

## 2023-02-05 ENCOUNTER — Encounter: Payer: Self-pay | Admitting: Family Medicine

## 2023-02-05 DIAGNOSIS — H401131 Primary open-angle glaucoma, bilateral, mild stage: Secondary | ICD-10-CM | POA: Diagnosis not present

## 2023-02-05 MED ORDER — ALPRAZOLAM 0.5 MG PO TABS: 0.5000 mg | ORAL_TABLET | Freq: Two times a day (BID) | ORAL | 1 refills | Status: DC | PRN

## 2023-02-05 NOTE — Telephone Encounter (Signed)
Requesting: alprazolam 0.5mg   Contract: 06/05/22 UDS: 01/30/22 Last Visit: 01/22/23 Next Visit: 05/07/23 Last Refill: 12/19/22 #30 and 1RF   Please Advise

## 2023-02-26 ENCOUNTER — Encounter: Payer: Self-pay | Admitting: Internal Medicine

## 2023-02-26 ENCOUNTER — Ambulatory Visit: Payer: Medicare HMO | Admitting: Internal Medicine

## 2023-02-26 VITALS — BP 126/72 | HR 68 | Temp 97.8°F | Ht 69.0 in | Wt 259.0 lb

## 2023-02-26 DIAGNOSIS — J439 Emphysema, unspecified: Secondary | ICD-10-CM | POA: Diagnosis not present

## 2023-02-26 DIAGNOSIS — J4489 Other specified chronic obstructive pulmonary disease: Secondary | ICD-10-CM | POA: Diagnosis not present

## 2023-02-26 MED ORDER — REVEFENACIN 175 MCG/3ML IN SOLN: 175.0000 ug | Freq: Every day | RESPIRATORY_TRACT | 1 refills | Status: DC

## 2023-02-26 MED ORDER — ARFORMOTEROL TARTRATE 15 MCG/2ML IN NEBU: 15.0000 ug | INHALATION_SOLUTION | Freq: Every day | RESPIRATORY_TRACT | 11 refills | Status: DC

## 2023-02-26 NOTE — Patient Instructions (Addendum)
Please schedule follow up scheduled with myself in 3 months.  If my schedule is not open yet, we will contact you with a reminder closer to that time. Please call [phone] if you haven't heard from [name] a month before.   We will stop breztri inhaler. I will switch you to Yupelri and brovana nebulized instead.  Continue albuterol inhaler 2 puffs up to 4 times a day as needed.  I recommend pulmonary rehab to improve your shortness of breath.
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
## 2023-02-27 ENCOUNTER — Telehealth: Payer: Self-pay | Admitting: Internal Medicine

## 2023-02-27 ENCOUNTER — Other Ambulatory Visit: Payer: Self-pay | Admitting: Physician Assistant

## 2023-02-27 NOTE — Telephone Encounter (Signed)
Patient's wife states that DME company brought out oxygen, but did not give patient POC due to failing the walk test. Patient's wife states that the patient needs to have the POC for work and not the small oxygen tanks. Please call wife back at [phone].

## 2023-02-28 NOTE — Telephone Encounter (Signed)
Called and spoke with patient's wife [name]. She stated that [employer] provided the patient with oxygen yesterday but did not provide him with a POC. They instead brought the small tanks and told him that he "failed the test for the POC". Per [name], he was told that since he needed the oxygen, he failed the test. I explained to her the other way a patient can fail a POC is if they need more than 5L of oxygen. She verbalized understanding. I advised her that I would send a message over to [employer] to check on this.   Community message has been to [employer] to follow up on this.

## 2023-03-01 ENCOUNTER — Ambulatory Visit: Payer: Medicare HMO | Admitting: Podiatry

## 2023-03-01 ENCOUNTER — Encounter: Payer: Self-pay | Admitting: Podiatry

## 2023-03-01 DIAGNOSIS — L84 Corns and callosities: Secondary | ICD-10-CM | POA: Diagnosis not present

## 2023-03-01 DIAGNOSIS — M79674 Pain in right toe(s): Secondary | ICD-10-CM | POA: Diagnosis not present

## 2023-03-01 DIAGNOSIS — E11621 Type 2 diabetes mellitus with foot ulcer: Secondary | ICD-10-CM | POA: Diagnosis not present

## 2023-03-01 DIAGNOSIS — M79675 Pain in left toe(s): Secondary | ICD-10-CM

## 2023-03-01 DIAGNOSIS — L97529 Non-pressure chronic ulcer of other part of left foot with unspecified severity: Secondary | ICD-10-CM

## 2023-03-01 DIAGNOSIS — M2042 Other hammer toe(s) (acquired), left foot: Secondary | ICD-10-CM | POA: Diagnosis not present

## 2023-03-01 DIAGNOSIS — L97522 Non-pressure chronic ulcer of other part of left foot with fat layer exposed: Secondary | ICD-10-CM | POA: Diagnosis not present

## 2023-03-01 DIAGNOSIS — B351 Tinea unguium: Secondary | ICD-10-CM

## 2023-03-02 ENCOUNTER — Encounter: Payer: Self-pay | Admitting: Family Medicine

## 2023-03-04 MED ORDER — LOSARTAN POTASSIUM 50 MG PO TABS: 50.0000 mg | ORAL_TABLET | Freq: Every day | ORAL | 1 refills | Status: DC

## 2023-03-04 NOTE — Telephone Encounter (Signed)
Pt calling in for update on oxygen

## 2023-03-05 NOTE — Progress Notes (Signed)
Subjective: Chief Complaint  Patient presents with   Foot Ulcer    Follow up ulcer 2nd toe left/1st MPJ stump left   "Its the same to me"   77 year old male presents the office for above concerns.  He states that when the top of the second toe is doing better.  No drainage or pus.  No increase in swelling or redness.  No red streaks.  Denies any fevers or chills.  Nails also be trimmed dystrophic and elongated and they are causing discomfort.  Objective: AAO x3, NAD DP/PT pulses palpable bilaterally, CRT less than 3 seconds Hammertoe contractures are present.  The dorsal aspect of the second toe the wound measures approximately 0.6 x 0.3 x 0.1 cm.  There is no probing, undermining or tunneling.  No surrounding erythema, ascending cellulitis.  No fluctuance or crepitation.  There is no malodor. The distal aspect of the second toe there is minimal callus and upon debridement no ongoing ulceration drainage or signs of infection.  This is doing much better.  On the plantar foot submetatarsal 1 minutes preulcerative callus with dried blood but upon debridement no ongoing ulceration noted today. Nails are hypertrophic, dystrophic, brittle, discolored, elongated 10. No surrounding redness or drainage. Tenderness nails 1-5 bilaterally The left hallux which is been amputated. No pain with calf compression, swelling, warmth, erythema  Assessment: 77 year old male with second toe ulceration from wearing new shoes; hammertoes and preulcerative calluses; symptomatic onychomycosis  Plan: -All treatment options discussed with the patient including all alternatives, risks, complications.  -There is no subcu tissues debrided today.  Continue with daily dressing changes and offloading at all times.  Monitor closely signs or symptoms of infection.  I did debride the callus on the second toe as well as submetatarsal 1 without any complications or bleeding.  Continue offloading, moisturizer. -Sharply debridement  nails x 10 without any complications or bleeding. -Monitor for any clinical signs or symptoms of infection and directed to call the office immediately should any occur or go to the ER.  Return in about 3 weeks (around 03/22/2023) for ulcer .  Vivi Barrack DPM

## 2023-03-07 NOTE — Telephone Encounter (Signed)
I called and spoke with the pt's spouse She states that [name]) told her that our order was put in wrong for o2  I called Melissa and she said that she will look into it and call us back  Will await her call back

## 2023-03-07 NOTE — Telephone Encounter (Signed)
Wife calling again for answers. Please call back @ [phone]

## 2023-03-10 ENCOUNTER — Encounter: Payer: Self-pay | Admitting: Internal Medicine

## 2023-03-13 ENCOUNTER — Encounter: Payer: Self-pay | Admitting: Physician Assistant

## 2023-03-13 ENCOUNTER — Ambulatory Visit: Payer: Medicare HMO | Admitting: Physician Assistant

## 2023-03-13 VITALS — BP 106/53 | HR 74 | Resp 20 | Ht 69.0 in | Wt 228.0 lb

## 2023-03-13 DIAGNOSIS — R413 Other amnesia: Secondary | ICD-10-CM | POA: Diagnosis not present

## 2023-03-13 MED ORDER — MEMANTINE HCL 5 MG PO TABS: 5.0000 mg | ORAL_TABLET | Freq: Two times a day (BID) | ORAL | 3 refills | Status: DC

## 2023-03-13 NOTE — Patient Instructions (Signed)
It was a pleasure to see you today at our office.   Recommendations:  Follow up in 6 months  Continue B12 supplements Neurocognitive testing  Continue memantine 5 mg 2 times a day  Recommend water exercise  for increasing mobility   Whom to call:  Memory  decline, memory medications: Call our office [phone]   For psychiatric meds, mood meds: Please have your primary care physician manage these medications.   Counseling regarding caregiver distress, including caregiver depression, anxiety and issues regarding community resources, adult day care programs, adult living facilities, or memory care questions:   Feel free to contact Misty Lisabeth Register, Social Worker at [phone]   For assessment of decision of mental capacity and competency:  Call Dr. Erick Blinks, geriatric psychiatrist at [phone]  For guidance in geriatric dementia issues please call [facility] [phone]  For guidance regarding [facility] and if placement were needed at the facility, contact Sidney Ace, Social Worker tel: [phone]  If you have any severe symptoms of a stroke, or other severe issues such as confusion,severe chills or fever, etc call 911 or go to the ER as you may need to be evaluated further    RECOMMENDATIONS FOR ALL PATIENTS WITH MEMORY PROBLEMS: 1. Continue to exercise (Recommend 30 minutes of walking everyday, or 3 hours every week) 2. Increase social interactions - continue going to [place] and enjoy social gatherings with friends and family 3. Eat healthy, avoid fried foods and eat more fruits and vegetables 4. Maintain adequate blood pressure, blood sugar, and blood cholesterol level. Reducing the risk of stroke and cardiovascular disease also helps promoting better memory. 5. Avoid stressful situations. Live a simple life and avoid aggravations. Organize your time and prepare for the next day in anticipation. 6. Sleep well, avoid any  interruptions of sleep and avoid any distractions in the bedroom that may interfere with adequate sleep quality 7. Avoid sugar, avoid sweets as there is a strong link between excessive sugar intake, diabetes, and cognitive impairment We discussed the Mediterranean diet, which has been shown to help patients reduce the risk of progressive memory disorders and reduces cardiovascular risk. This includes eating fish, eat fruits and green leafy vegetables, nuts like almonds and hazelnuts, walnuts, and also use olive oil. Avoid fast foods and fried foods as much as possible. Avoid sweets and sugar as sugar use has been linked to worsening of memory function.  There is always a concern of gradual progression of memory problems. If this is the case, then we may need to adjust level of care according to patient needs. Support, both to the patient and caregiver, should then be put into place.    FALL PRECAUTIONS: Be cautious when walking. Scan the area for obstacles that may increase the risk of trips and falls. When getting up in the mornings, sit up at the edge of the bed for a few minutes before getting out of bed. Consider elevating the bed at the head end to avoid drop of blood pressure when getting up. Walk always in a well-lit room (use night lights in the walls). Avoid area rugs or power cords from appliances in the middle of the walkways. Use a walker or a cane if necessary and consider physical therapy for balance exercise. Get your eyesight checked regularly.  FINANCIAL OVERSIGHT: Supervision, especially oversight when making financial decisions or transactions is also recommended.  HOME SAFETY: Consider the safety of the kitchen when operating appliances like stoves, microwave oven,  and blender. Consider having supervision and share cooking responsibilities until no longer able to participate in those. Accidents with firearms and other hazards in the house should be identified and addressed as  well.   ABILITY TO BE LEFT ALONE: If patient is unable to contact 911 operator, consider using LifeLine, or when the need is there, arrange for someone to stay with patients. Smoking is a fire hazard, consider supervision or cessation. Risk of wandering should be assessed by caregiver and if detected at any point, supervision and safe proof recommendations should be instituted.  MEDICATION SUPERVISION: Inability to self-administer medication needs to be constantly addressed. Implement a mechanism to ensure safe administration of the medications.   DRIVING: Regarding driving, in patients with progressive memory problems, driving will be impaired. We advise to have someone else do the driving if trouble finding directions or if minor accidents are reported. Independent driving assessment is available to determine safety of driving.   If you are interested in the driving assessment, you can contact the following:  [employer] in [place] [phone]  [facility] [phone]  [facility] [phone] or [phone]   We have sent a referral to [facility] for your MRI and they will call you directly to schedule your appointment. They are located at [address] [facility]. If you need to contact them directly please call [phone].   Your provider has requested that you have labwork completed today. Please go to [facility] ([address]) on the second floor of this building before leaving the office today. You do not need to check in. If you are not called within 15 minutes please check with the front desk.
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
## 2023-03-14 ENCOUNTER — Telehealth: Payer: Self-pay | Admitting: Internal Medicine

## 2023-03-14 MED ORDER — BUDESONIDE 0.25 MG/2ML IN SUSP: 0.2500 mg | Freq: Every day | RESPIRATORY_TRACT | 12 refills | Status: DC

## 2023-03-14 NOTE — Telephone Encounter (Signed)
[name] is returning phone call. [name] phone number is [phone]. °

## 2023-03-14 NOTE — Telephone Encounter (Signed)
ATC melissa x1 to get an update regarding the oxygen.  Will await return call.

## 2023-03-15 NOTE — Telephone Encounter (Signed)
Pt has appointment with [employer] on 03/19/23.

## 2023-03-19 ENCOUNTER — Other Ambulatory Visit: Payer: Self-pay | Admitting: *Deleted

## 2023-03-19 MED ORDER — BUDESONIDE 0.25 MG/2ML IN SUSP: 0.2500 mg | Freq: Every day | RESPIRATORY_TRACT | 12 refills | Status: AC

## 2023-03-26 NOTE — Telephone Encounter (Signed)
Pt has received his poc, nothing further needed at this time.

## 2023-03-29 ENCOUNTER — Telehealth: Payer: Self-pay | Admitting: Family Medicine

## 2023-03-29 NOTE — Telephone Encounter (Signed)
Called pt insurance back verbal was given regarding diabetes [employer] [phone]

## 2023-03-29 NOTE — Telephone Encounter (Signed)
[facility] insurance [phone]  [employer] called wanting to verify that the patient has diabetes so he can continue to qualify for the Chronic condition special needs plan. Please advise.

## 2023-03-30 ENCOUNTER — Encounter: Payer: Self-pay | Admitting: Family Medicine

## 2023-04-02 ENCOUNTER — Other Ambulatory Visit: Payer: Self-pay

## 2023-04-02 ENCOUNTER — Ambulatory Visit: Payer: Medicare HMO | Admitting: Podiatry

## 2023-04-02 DIAGNOSIS — M7989 Other specified soft tissue disorders: Secondary | ICD-10-CM

## 2023-04-02 DIAGNOSIS — L97522 Non-pressure chronic ulcer of other part of left foot with fat layer exposed: Secondary | ICD-10-CM | POA: Diagnosis not present

## 2023-04-02 DIAGNOSIS — L84 Corns and callosities: Secondary | ICD-10-CM | POA: Diagnosis not present

## 2023-04-02 DIAGNOSIS — L97529 Non-pressure chronic ulcer of other part of left foot with unspecified severity: Secondary | ICD-10-CM | POA: Diagnosis not present

## 2023-04-02 DIAGNOSIS — E11621 Type 2 diabetes mellitus with foot ulcer: Secondary | ICD-10-CM | POA: Diagnosis not present

## 2023-04-02 MED ORDER — CARVEDILOL 25 MG PO TABS: ORAL_TABLET | ORAL | 3 refills | Status: DC

## 2023-04-02 NOTE — Progress Notes (Signed)
Subjective: Chief Complaint  Patient presents with   Foot Ulcer    Rm 14  Recheck left foot ulcer. Pt states improvement. Some edema. No drainage or bleeding or sign of infection per pt.      77 year old male presents the office for above concerns.  He is continue with his arm dressing changes daily he feels that this has been helping.  Denies any drainage or pus.  No present swelling or redness.  No fevers or chills.  He has been swelling to both of his legs as well as several weeks.  No other concerns.   Objective: AAO x3, NAD DP/PT pulses palpable bilaterally, CRT less than 3 seconds Pitting edema present bilaterally Hammertoe contractures are present.  The dorsal aspect of the second toe the wound measures approximately 0.6 x 0.3 x 0.1 cm.  Really not able to measure this as it was covered with mild callus, dry skin.  After debridement the wound is 100% granular there is no probing to bone, undermining or tunneling.  There is no fluctuance or crepitation there is no malodor. Hyperkeratotic tissue present submetatarsal 1 remain on the left foot without any underlying ulceration or signs of infection the area is preulcerative. Hammertoes present. No pain with calf compression, swelling, warmth, erythema  Assessment: 77 year old male with second toe ulceration from wearing new shoes; hammertoes and preulcerative calluses; symptomatic onychomycosis  Plan: -All treatment options discussed with the patient including all alternatives, risks, complications.  -Medically necessary wound debridement was performed today.  I sharply debrided the wound left second toe the dorsal IPJ foot and complications utilize #3 call with scalpel.  There was no significant blood loss present.  No current signs of infection noted otherwise.  Tolerated procedure well any complications.  Iodosorb was applied followed by dressing.  Discussed daily dressing changes. -Sharp debrided hyperkeratotic lesion with any  complications or bleeding left foot. -He has had pitting edema on both legs over the last several weeks. He is chest pain or shortness of breath that he reports.  Recommend follow-up with his PCP.  Return in about 3 weeks (around 04/23/2023) for toe ulcer.  Vivi Barrack DPM

## 2023-04-09 DIAGNOSIS — M5441 Lumbago with sciatica, right side: Secondary | ICD-10-CM | POA: Diagnosis not present

## 2023-04-09 DIAGNOSIS — M4316 Spondylolisthesis, lumbar region: Secondary | ICD-10-CM | POA: Diagnosis not present

## 2023-04-09 DIAGNOSIS — M4319 Spondylolisthesis, multiple sites in spine: Secondary | ICD-10-CM | POA: Diagnosis not present

## 2023-04-16 ENCOUNTER — Ambulatory Visit: Payer: Medicare HMO | Admitting: Internal Medicine

## 2023-04-23 ENCOUNTER — Ambulatory Visit: Payer: Medicare HMO | Admitting: Family Medicine

## 2023-04-30 ENCOUNTER — Ambulatory Visit: Payer: Medicare Other

## 2023-04-30 ENCOUNTER — Ambulatory Visit: Payer: Medicare HMO | Admitting: Psychology

## 2023-04-30 ENCOUNTER — Encounter: Payer: Self-pay | Admitting: Psychology

## 2023-04-30 DIAGNOSIS — I69319 Unspecified symptoms and signs involving cognitive functions following cerebral infarction: Secondary | ICD-10-CM

## 2023-04-30 DIAGNOSIS — R4189 Other symptoms and signs involving cognitive functions and awareness: Secondary | ICD-10-CM

## 2023-04-30 NOTE — Progress Notes (Signed)
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 ranging from the below average to above average normative ranges.     Assessed visuospatial/visuoconstructional abilities were average.    Learning (i.e., encoding) of novel verbal information was below average to average. Spontaneous delayed recall (i.e., retrieval) of previously learned information was also below average to average. Retention rates were appropriate across all administered memory tasks. Performance across recognition tasks was below average to average, suggesting evidence for information consolidation.   Results of emotional screening instruments suggested that recent symptoms of generalized anxiety were in the severe range, while symptoms of depression were within the mild range. A screening instrument assessing recent sleep quality suggested the presence of minimal sleep dysfunction.  Tables of Scores:   Note: This summary of test scores accompanies the interpretive report and should not be considered in isolation without reference to the appropriate sections in the text. Descriptors are based on appropriate normative data and may be adjusted based on clinical judgment.  Terms such as "Within Normal Limits" and "Outside Normal Limits" are used when a more specific description of the test score cannot be determined.       Percentile - Normative Descriptor > 98 - Exceptionally High 91-97 - Well Above Average 75-90 - Above Average 25-74 - Average 9-24 - Below Average 2-8 - Well Below Average < 2 - Exceptionally Low       Orientation:      Raw Score Percentile   NAB Orientation, Form 1 26/29 --- ---       Cognitive Screening:      Raw Score Percentile   SLUMS: 22/30 --- ---       RBANS, Form A: Standard Score/ Scaled Score Percentile   Total Score 81 10 Below Average  Immediate Memory 83 13 Below Average    List Learning 6 9 Below Average    Story Memory 8 25 Average  Visuospatial/Constructional 89 23  Below Average    Figure Copy 9 37 Average    Line Orientation 15/20 26-50 Average  Language 92 30 Average    Picture Naming 10/10 51-75 Average    Semantic Fluency 7 16 Below Average  Attention 85 16 Below Average    Digit Span 8 25 Average    Coding 7 16 Below Average  Delayed Memory 81 10 Below Average    List Recall 4/10 26-50 Average    List Recognition 17/20 10-16 Below Average    Story Recall 6 9 Below Average    Story Recognition 9/12 16-26 Below Average  to Average    Figure Recall 10 50 Average    Figure Recognition 7/8 53-69 Average        Intellectual Functioning:      Standard Score Percentile   Test of Premorbid Functioning: 76 5 Well Below Average       Attention/Executive Function:     Trail Making Test (TMT): Raw Score (T Score) Percentile     Part A 51 secs.,  1 error (43) 25 Average    Part B 98 secs.,  0 errors (53) 62 Average         Scaled Score Percentile   WAIS-IV Digit Span: 11 63 Average    Forward 12 75 Above Average    Backward 9 37 Average    Sequencing 10 50 Average        Scaled Score Percentile   WAIS-IV Similarities: 8 25 Average       D-KEFS Color-Word Interference Test: Raw Score (Scaled  Score) Percentile     Color Naming 36 secs. (9) 37 Average    Word Reading 28 secs. (9) 37 Average    Inhibition 63 secs. (12) 75 Above Average      Total Errors 17 errors (1) <1 Exceptionally Low    Inhibition/Switching 74 secs. (11) 63 Average      Total Errors 10 errors (3) 1 Exceptionally Low       D-KEFS Verbal Fluency Test: Raw Score (Scaled Score) Percentile     Letter Total Correct 31 (9) 37 Average    Category Total Correct 38 (12) 75 Above Average    Category Switching Total Correct 9 (6) 9 Below Average    Category Switching Accuracy 6 (5) 5 Well Below Average      Total Set Loss Errors 2 (10) 50 Average      Total Repetition Errors 0 (13) 84 Above Average       NAB Executive Functions Module, Form 1: T Score Percentile     Judgment 55 69 Average       Language:     Verbal Fluency Test: Raw Score (T Score) Percentile     Phonemic Fluency (FAS) 31 (46) 34 Average    Animal Fluency 22 (61) 86 Above Average        NAB Language Module, Form 1: T Score Percentile     Auditory Comprehension 56 73 Average    Naming 30/31 (59) 82 Above Average       Visuospatial/Visuoconstruction:      Raw Score Percentile   Clock Drawing: 9/10 --- Within Normal Limits        Scaled Score Percentile   WAIS-IV Block Design: 11 63 Average  WAIS-IV Matrix Reasoning: 9 37 Average       Sensory-Motor:     [place] Grooved Pegboard Test: Raw Score Percentile     Dominant Hand 188 secs.,  3 drops  1 Exceptionally Low    Non-Dominant Hand 145 secs.,  0 drops  14 Below Average       Mood and Personality:      Raw Score Percentile   Geriatric Depression Scale: 13 --- Mild  Geriatric Anxiety Scale: 30 --- Severe    Somatic 13 --- Severe    Cognitive 9 --- Severe    Affective 8 --- Moderate       Additional Questionnaires:      Raw Score Percentile   PROMIS Sleep Disturbance Questionnaire: 17 --- None to Slight   Informed Consent and Coding/Compliance:   The current evaluation  represents a clinical evaluation for the purposes previously outlined by the referral source and is in no way reflective of a forensic evaluation.   Mr. Arduini was provided with a verbal description of the nature and purpose of the present neuropsychological evaluation. Also reviewed were the foreseeable risks and/or discomforts and benefits of the procedure, limits of confidentiality, and mandatory reporting requirements of this provider. The patient was given the opportunity to ask questions and receive answers about the evaluation. Oral consent to participate was provided by the patient.   This evaluation was conducted by Newman Nickels, Ph.D., ABPP-CN, board certified clinical neuropsychologist. Mr. Furio completed a clinical interview with Dr. Milbert Coulter, billed as one unit [id number], and 125 minutes of cognitive testing and scoring, billed as one unit [phone] and three additional units [id number]. Psychometrist Shan Levans, B.S., assisted Dr. Milbert Coulter with test administration and scoring procedures. As a separate and discrete service, one unit [id number] and two units [phone] were billed for Dr. Tammy Sours time spent in interpretation and report writing.

## 2023-04-30 NOTE — Progress Notes (Signed)
   Psychometrician Note   Cognitive testing was administered to [name] by Shan Levans, B.S. (psychometrist) under the supervision of Dr. Newman Nickels, Ph.D., licensed psychologist on 04/30/2023. [name] did not appear overtly distressed by the testing session per behavioral observation or responses across self-report questionnaires. Rest breaks were offered.    The battery of tests administered was selected by Dr. Newman Nickels, Ph.D. with consideration to [name] current level of functioning, the nature of his symptoms, emotional and behavioral responses during interview, level of literacy, observed level of motivation/effort, and the nature of the referral question. This battery was communicated to the psychometrist. Communication between Dr. Newman Nickels, Ph.D. and the psychometrist was ongoing throughout the evaluation and Dr. Newman Nickels, Ph.D. was immediately accessible at all times. Dr. Newman Nickels, Ph.D. provided supervision to the psychometrist on the date of this service to the extent necessary to assure the quality of all services provided.    [name] will return within approximately 1-2 weeks for an interactive feedback session with Dr. Milbert Coulter at which time his test performances, clinical impressions, and treatment recommendations will be reviewed in detail. [name] understands he can contact our office should he require our assistance before this time.  A total of 125 minutes of billable time were spent face-to-face with [name] by the psychometrist. This includes both test administration and scoring time. Billing for these services is reflected in the clinical report generated by Dr. Newman Nickels, Ph.D.  This note reflects time spent with the psychometrician and does not include test scores or any clinical interpretations made by Dr. Milbert Coulter. The full report will follow in a separate note.

## 2023-05-03 ENCOUNTER — Ambulatory Visit: Payer: Medicare HMO | Admitting: Podiatry

## 2023-05-03 DIAGNOSIS — E11621 Type 2 diabetes mellitus with foot ulcer: Secondary | ICD-10-CM | POA: Diagnosis not present

## 2023-05-03 DIAGNOSIS — L84 Corns and callosities: Secondary | ICD-10-CM

## 2023-05-03 DIAGNOSIS — M7989 Other specified soft tissue disorders: Secondary | ICD-10-CM

## 2023-05-03 DIAGNOSIS — M2042 Other hammer toe(s) (acquired), left foot: Secondary | ICD-10-CM

## 2023-05-03 DIAGNOSIS — L97529 Non-pressure chronic ulcer of other part of left foot with unspecified severity: Secondary | ICD-10-CM | POA: Diagnosis not present

## 2023-05-04 ENCOUNTER — Other Ambulatory Visit: Payer: Self-pay | Admitting: Family Medicine

## 2023-05-06 ENCOUNTER — Other Ambulatory Visit: Payer: Self-pay | Admitting: Family

## 2023-05-06 NOTE — Assessment & Plan Note (Signed)
No recent exacerbation 

## 2023-05-06 NOTE — Assessment & Plan Note (Signed)
Hydrate and monitor 

## 2023-05-06 NOTE — Assessment & Plan Note (Signed)
On Levothyroxine, continue to monitor 

## 2023-05-06 NOTE — Assessment & Plan Note (Signed)
Well controlled, no changes to meds. Encouraged heart healthy diet such as the DASH diet and exercise as tolerated.  °
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
## 2023-05-06 NOTE — Assessment & Plan Note (Signed)
hgba1c acceptable, minimize simple carbs. Increase exercise as tolerated. Continue current meds 

## 2023-05-06 NOTE — Assessment & Plan Note (Signed)
Fluoxetine 60 mg daily

## 2023-05-07 ENCOUNTER — Encounter: Payer: Self-pay | Admitting: Family Medicine

## 2023-05-07 ENCOUNTER — Ambulatory Visit (INDEPENDENT_AMBULATORY_CARE_PROVIDER_SITE_OTHER): Payer: Medicare HMO | Admitting: Family Medicine

## 2023-05-07 VITALS — BP 138/82 | HR 80 | Temp 97.8°F | Resp 16 | Ht 69.0 in | Wt 262.4 lb

## 2023-05-07 DIAGNOSIS — I159 Secondary hypertension, unspecified: Secondary | ICD-10-CM | POA: Diagnosis not present

## 2023-05-07 DIAGNOSIS — E1149 Type 2 diabetes mellitus with other diabetic neurological complication: Secondary | ICD-10-CM

## 2023-05-07 DIAGNOSIS — Z79899 Other long term (current) drug therapy: Secondary | ICD-10-CM

## 2023-05-07 DIAGNOSIS — M1A9XX0 Chronic gout, unspecified, without tophus (tophi): Secondary | ICD-10-CM | POA: Diagnosis not present

## 2023-05-07 DIAGNOSIS — E039 Hypothyroidism, unspecified: Secondary | ICD-10-CM | POA: Diagnosis not present

## 2023-05-07 DIAGNOSIS — F329 Major depressive disorder, single episode, unspecified: Secondary | ICD-10-CM

## 2023-05-07 DIAGNOSIS — N289 Disorder of kidney and ureter, unspecified: Secondary | ICD-10-CM

## 2023-05-07 DIAGNOSIS — F419 Anxiety disorder, unspecified: Secondary | ICD-10-CM | POA: Diagnosis not present

## 2023-05-07 DIAGNOSIS — J432 Centrilobular emphysema: Secondary | ICD-10-CM

## 2023-05-07 DIAGNOSIS — Z1211 Encounter for screening for malignant neoplasm of colon: Secondary | ICD-10-CM

## 2023-05-07 LAB — COMPREHENSIVE METABOLIC PANEL
ALT: 25 U/L (ref 0–53)
AST: 19 U/L (ref 0–37)
Albumin: 4.3 g/dL (ref 3.5–5.2)
Alkaline Phosphatase: 83 U/L (ref 39–117)
BUN: 19 mg/dL (ref 6–23)
CO2: 30 mEq/L (ref 19–32)
Calcium: 9.7 mg/dL (ref 8.4–10.5)
Chloride: 101 mEq/L (ref 96–112)
Creatinine, Ser: 0.88 mg/dL (ref 0.40–1.50)
GFR: 83.99 mL/min (ref >60.00)
Glucose, Bld: 135 mg/dL — ABNORMAL HIGH (ref 70–99)
Potassium: 4.7 mEq/L (ref 3.5–5.1)
Sodium: 140 mEq/L (ref 135–145)
Total Bilirubin: 0.5 mg/dL (ref 0.2–1.2)
Total Protein: 7 g/dL (ref 6.0–8.3)

## 2023-05-07 LAB — LIPID PANEL
Cholesterol: 137 mg/dL (ref 0–200)
HDL: 36.2 mg/dL — ABNORMAL LOW (ref >39.00)
LDL Cholesterol: 71 mg/dL (ref 0–99)
NonHDL: 100.76
Total CHOL/HDL Ratio: 4
Triglycerides: 147 mg/dL (ref 0.0–149.0)
VLDL: 29.4 mg/dL (ref 0.0–40.0)

## 2023-05-07 LAB — CBC WITH DIFFERENTIAL/PLATELET
Basophils Absolute: 0 10*3/uL (ref 0.0–0.1)
Basophils Relative: 0.6 % (ref 0.0–3.0)
Eosinophils Absolute: 0.2 10*3/uL (ref 0.0–0.7)
Eosinophils Relative: 3.4 % (ref 0.0–5.0)
HCT: 45.2 % (ref 39.0–52.0)
Hemoglobin: 14.7 g/dL (ref 13.0–17.0)
Lymphocytes Relative: 27.8 % (ref 12.0–46.0)
Lymphs Abs: 2 10*3/uL (ref 0.7–4.0)
MCHC: 32.5 g/dL (ref 30.0–36.0)
MCV: 97.5 fl (ref 78.0–100.0)
Monocytes Absolute: 0.6 10*3/uL (ref 0.1–1.0)
Monocytes Relative: 8.3 % (ref 3.0–12.0)
Neutro Abs: 4.4 10*3/uL (ref 1.4–7.7)
Neutrophils Relative %: 59.9 % (ref 43.0–77.0)
Platelets: 241 10*3/uL (ref 150.0–400.0)
RBC: 4.63 Mil/uL (ref 4.22–5.81)
RDW: 14.6 % (ref 11.5–15.5)
WBC: 7.3 10*3/uL (ref 4.0–10.5)

## 2023-05-07 LAB — MICROALBUMIN / CREATININE URINE RATIO
Creatinine,U: 153.2 mg/dL
Microalb Creat Ratio: 1 mg/g (ref 0.0–30.0)
Microalb, Ur: 1.5 mg/dL (ref 0.0–1.9)

## 2023-05-07 LAB — HEMOGLOBIN A1C: Hgb A1c MFr Bld: 6.9 % — ABNORMAL HIGH (ref 4.6–6.5)

## 2023-05-07 NOTE — Patient Instructions (Signed)
Edema  Edema is an abnormal buildup of fluids in the body tissues and under the skin. Swelling of the legs, feet, and ankles is a common symptom that becomes more likely as you get older. Swelling is also common in looser tissues, such as around the eyes. Pressing on the area may make a temporary dent in your skin (pitting edema). This fluid may also accumulate in your lungs (pulmonary edema). There are many possible causes of edema. Eating too much salt (sodium) and being on your feet or sitting for a long time can cause edema in your legs, feet, and ankles. Common causes of edema include: Certain medical conditions, such as heart failure, liver or kidney disease, and cancer. Weak leg blood vessels. An injury. Pregnancy. Medicines. Being obese. Low protein levels in the blood. Hot weather may make edema worse. Edema is usually painless. Your skin may look swollen or shiny. Follow these instructions at home: Medicines Take over-the-counter and prescription medicines only as told by your health care provider. Your health care provider may prescribe a medicine to help your body get rid of extra water (diuretic). Take this medicine if you are told to take it. Eating and drinking Eat a low-salt (low-sodium) diet to reduce fluid as told by your health care provider. Sometimes, eating less salt may reduce swelling. Depending on the cause of your swelling, you may need to limit how much fluid you drink (fluid restriction). General instructions Raise (elevate) the injured area above the level of your heart while you are sitting or lying down. Do not sit still or stand for long periods of time. Do not wear tight clothing. Do not wear garters on your upper legs. Exercise your legs to get your circulation going. This helps to move the fluid back into your blood vessels, and it may help the swelling go down. Wear compression stockings as told by your health care provider. These stockings help to prevent  blood clots and reduce swelling in your legs. It is important that these are the correct size. These stockings should be prescribed by your health care provider to prevent possible injuries. If elastic bandages or wraps are recommended, use them as told by your health care provider. Contact a health care provider if: Your edema does not get better with treatment. You have heart, liver, or kidney disease and have symptoms of edema. You have sudden and unexplained weight gain. Get help right away if: You develop shortness of breath or chest pain. You cannot breathe when you lie down. You develop pain, redness, or warmth in the swollen areas. You have heart, liver, or kidney disease and suddenly get edema. You have a fever and your symptoms suddenly get worse. These symptoms may be an emergency. Get help right away. Call 911. Do not wait to see if the symptoms will go away. Do not drive yourself to the hospital. Summary Edema is an abnormal buildup of fluids in the body tissues and under the skin. Eating too much salt (sodium)and being on your feet or sitting for a long time can cause edema in your legs, feet, and ankles. Raise (elevate) the injured area above the level of your heart while you are sitting or lying down. Follow your health care provider's instructions about diet and how much fluid you can drink. This information is not intended to replace advice given to you by your health care provider. Make sure you discuss any questions you have with your health care provider. Document Revised: 06/26/2021 Document   Reviewed: 06/26/2021 Elsevier Patient Education  2024 [employer]vier Inc.  

## 2023-05-08 DIAGNOSIS — F419 Anxiety disorder, unspecified: Secondary | ICD-10-CM | POA: Insufficient documentation

## 2023-05-08 NOTE — Assessment & Plan Note (Signed)
Continue Fluoxetine and Alprazolam prn

## 2023-05-09 LAB — DRUG MONITORING PANEL 376104, URINE
Desmethyltramadol: NEGATIVE ng/mL (ref <100)
Tramadol: NEGATIVE ng/mL (ref <100)

## 2023-05-10 LAB — DRUG MONITORING PANEL 376104, URINE
Alphahydroxyalprazolam: 136 ng/mL — ABNORMAL HIGH (ref <25)
Alphahydroxymidazolam: NEGATIVE ng/mL (ref <50)
Alphahydroxytriazolam: NEGATIVE ng/mL (ref <50)
Aminoclonazepam: NEGATIVE ng/mL (ref <25)
Amphetamines: NEGATIVE ng/mL (ref <500)
Barbiturates: NEGATIVE ng/mL (ref <300)
Benzodiazepines: POSITIVE ng/mL — AB (ref <100)
Cocaine Metabolite: NEGATIVE ng/mL (ref <150)
Codeine: NEGATIVE ng/mL (ref <50)
Desmethyltramadol: NEGATIVE ng/mL (ref <100)
Hydrocodone: 1089 ng/mL — ABNORMAL HIGH (ref <50)
Hydromorphone: NEGATIVE ng/mL (ref <50)
Hydroxyethylflurazepam: NEGATIVE ng/mL (ref <50)
Lorazepam: NEGATIVE ng/mL (ref <50)
Morphine: NEGATIVE ng/mL (ref <50)
Nordiazepam: NEGATIVE ng/mL (ref <50)
Norhydrocodone: 1365 ng/mL — ABNORMAL HIGH (ref <50)
Opiates: POSITIVE ng/mL — AB (ref <100)
Oxazepam: NEGATIVE ng/mL (ref <50)
Oxycodone: NEGATIVE ng/mL (ref <100)
Temazepam: NEGATIVE ng/mL (ref <50)
Tramadol: NEGATIVE ng/mL (ref <100)

## 2023-05-10 LAB — DM TEMPLATE

## 2023-05-13 NOTE — Progress Notes (Signed)
Subjective: Chief Complaint  Patient presents with   Foot Ulcer    Left foot ulcer/leg swelling  Pt stated that his leg still swells every night     77 year old male presents the office for above concerns.  States that the wounds are doing well and he does not see any openings or any drainage.  He gets swelling at nighttime but is better in the morning.  No calf pain.  No fevers or chills.   Objective: AAO x3, NAD DP/PT pulses palpable bilaterally, CRT less than 3 seconds Pitting edema present bilaterally Hammertoe contractures present.  Appears ulceration to dorsal aspect of second toe is healed.  Also along submetatarsal 1 ruminant hyperkeratotic lesion with dried blood which is smaller today and upon debridement there is no ongoing ulceration drainage or signs of infection the area is preulcerative.  There is some edema present bilateral feet there is no erythema or warmth.  Calf is supple.  There is no pain with calf compression, erythema or warmth.  Assessment: 77 year old male with second toe ulceration from wearing new shoes; hammertoes and preulcerative calluses  Plan: -All treatment options discussed with the patient including all alternatives, risks, complications.  -Ulceration second toe is healed.  Continue offloading.  Sharply debrided the preulcerative callus plantarly without any complications or bleeding.  Continue offloading. -Discussed compression, elevation. -Monitor for any clinical signs or symptoms of infection and directed to call the office immediately should any occur or go to the ER.  Return in about 6 weeks (around 06/14/2023).  Vivi Barrack DPM

## 2023-05-14 ENCOUNTER — Ambulatory Visit (INDEPENDENT_AMBULATORY_CARE_PROVIDER_SITE_OTHER): Payer: Medicare HMO | Admitting: Psychology

## 2023-05-14 DIAGNOSIS — F411 Generalized anxiety disorder: Secondary | ICD-10-CM | POA: Diagnosis not present

## 2023-05-14 DIAGNOSIS — I69319 Unspecified symptoms and signs involving cognitive functions following cerebral infarction: Secondary | ICD-10-CM

## 2023-05-14 NOTE — Progress Notes (Signed)
   Neuropsychology Feedback Session [name]. [facility] [place] Department of Neurology  Reason for Referral:   [name] is a 77 y.o. right-handed Caucasian male referred by Marlowe Kays, PA-C, to characterize his current cognitive functioning and assist with diagnostic clarity and treatment planning in the context of subjective cognitive decline with a prior stroke history and numerous medical comorbidities.   Feedback:   [name] completed a comprehensive neuropsychological evaluation on 04/30/2023. Please refer to that encounter for the full report and recommendations. Briefly, results suggested an isolated impairment surrounding fine motor coordination and speed using his dominant (right) hand. Performance variability was additionally exhibited across executive functioning. Performances were appropriate across all other assessed cognitive domains relative to age-matched peers and premorbid intellectual estimations. Variability surrounding executive functioning can certainly be explained by his prior stroke history as both small events sustained in 2006 were said to involve the right anterior cerebral artery (ACA) watershed regions. Executive dysfunction is relatively common given this stroke location. ACA strokes can also impact motor functioning; however, [name] performance is the inverse of what would be expected given his right-sided stroke location and greater dysfunction involving his right hand. Deficits stemming from his cerebrovascular history would be exacerbated by untreated (albeit mild) sleep apnea, as well as acute psychiatric distress as he reported mild levels of depression and severe levels of generalized anxiety present over the past 1-2 weeks. Specific to memory, [name] was able to learn novel verbal and visual information efficiently and retain this knowledge after lengthy delays. Overall, memory performance combined with intact performances across other  areas of cognitive functioning is not suggestive of symptomatic Alzheimer's disease.  [name] was accompanied by his wife during the current feedback session. Content of the current session focused on the results of his neuropsychological evaluation. [name] was given the opportunity to ask questions and his questions were answered. He was encouraged to reach out should additional questions arise. A copy of his report was provided at the conclusion of the visit.      One unit [phone] was billed for Dr. Tammy Sours time spent preparing for, conducting, and documenting the current feedback session with [name].

## 2023-05-17 DIAGNOSIS — H401131 Primary open-angle glaucoma, bilateral, mild stage: Secondary | ICD-10-CM | POA: Diagnosis not present

## 2023-05-19 ENCOUNTER — Other Ambulatory Visit: Payer: Self-pay | Admitting: Family Medicine

## 2023-05-20 DIAGNOSIS — Z1211 Encounter for screening for malignant neoplasm of colon: Secondary | ICD-10-CM | POA: Diagnosis not present

## 2023-05-27 LAB — COLOGUARD: COLOGUARD: NEGATIVE

## 2023-05-28 ENCOUNTER — Ambulatory Visit: Payer: Medicare HMO | Admitting: Internal Medicine

## 2023-05-28 ENCOUNTER — Encounter: Payer: Self-pay | Admitting: Internal Medicine

## 2023-05-28 VITALS — BP 112/58 | HR 61 | Ht 69.0 in | Wt 261.0 lb

## 2023-05-28 DIAGNOSIS — J439 Emphysema, unspecified: Secondary | ICD-10-CM

## 2023-05-28 DIAGNOSIS — J4489 Other specified chronic obstructive pulmonary disease: Secondary | ICD-10-CM | POA: Diagnosis not present

## 2023-05-28 DIAGNOSIS — G4733 Obstructive sleep apnea (adult) (pediatric): Secondary | ICD-10-CM

## 2023-05-28 DIAGNOSIS — J9611 Chronic respiratory failure with hypoxia: Secondary | ICD-10-CM | POA: Diagnosis not present
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
## 2023-05-28 NOTE — Patient Instructions (Signed)
Please schedule follow up scheduled with myself in 6 months.  If my schedule is not open yet, we will contact you with a reminder closer to that time. Please call [phone] if you haven't heard from [name] a month before.   Glad your breathing is doing better!  Continue the budesonide nebulizer treatment Use the afomoterol nebulizer treatment once daily.   Use the albuterol breathing treatments up to 4 times a day as needed for wheezing, shortness of breath.  Continue using oxygen for goal saturations over 88%.

## 2023-06-01 ENCOUNTER — Other Ambulatory Visit: Payer: Self-pay | Admitting: Family Medicine

## 2023-06-03 NOTE — Telephone Encounter (Signed)
Requesting: alprazolam 0.5mg   Contract: 05/07/23 UDS: 05/07/23 Last Visit: 05/07/23 Next Visit: 08/06/23 Last Refill: 02/05/23 #30 and 1RF   Please Advise

## 2023-06-14 ENCOUNTER — Ambulatory Visit: Payer: Medicare HMO | Admitting: Podiatry

## 2023-06-18 ENCOUNTER — Ambulatory Visit: Payer: Medicare HMO | Admitting: Podiatry

## 2023-06-18 DIAGNOSIS — E11621 Type 2 diabetes mellitus with foot ulcer: Secondary | ICD-10-CM

## 2023-06-18 DIAGNOSIS — L84 Corns and callosities: Secondary | ICD-10-CM

## 2023-06-18 DIAGNOSIS — L97529 Non-pressure chronic ulcer of other part of left foot with unspecified severity: Secondary | ICD-10-CM | POA: Diagnosis not present

## 2023-06-18 NOTE — Progress Notes (Signed)
Subjective: No chief complaint on file.   77 year old male presents the office for evaluation of preulcerative lesions on the left foot.  States he has been doing well no open lesions.  No drainage or pus.  No fevers or chills.  No other concerns.  Objective: AAO x3, NAD DP/PT pulses palpable bilaterally, CRT less than 3 seconds Pitting edema present bilaterally Hammertoe contractures present.  Ulceration of the second toe has healed.  The area that was present submetatarsal 1 area has a lesion with some dried blood but upon debridement there is only 1 small area of dried blood present there is no open lesion identified.  There is no edema, erythema or sign of infection. There is no pain with calf compression, erythema or warmth.  Assessment: 77 year old male with second toe ulceration from wearing new shoes; hammertoes and preulcerative calluses  Plan: -All treatment options discussed with the patient including all alternatives, risks, complications.  -Ulceration second toe is healed.  Continue offloading.   -Sharply debrided the preulcerative callus plantarly without any complications or bleeding.  Continue offloading. -Monitor for any clinical signs or symptoms of infection and directed to call the office immediately should any occur or go to the ER.  Return in about 6 weeks (around 07/30/2023) for nails and pre-ulcerative callus.   Vivi Barrack DPM

## 2023-06-21 ENCOUNTER — Encounter: Payer: Self-pay | Admitting: Family Medicine

## 2023-06-21 ENCOUNTER — Other Ambulatory Visit: Payer: Self-pay | Admitting: Family

## 2023-06-21 MED ORDER — FLUOXETINE HCL 40 MG PO CAPS: 40.0000 mg | ORAL_CAPSULE | Freq: Every day | ORAL | 0 refills | Status: DC

## 2023-06-21 MED ORDER — ATORVASTATIN CALCIUM 80 MG PO TABS: 80.0000 mg | ORAL_TABLET | Freq: Every day | ORAL | 0 refills | Status: DC

## 2023-07-29 NOTE — Assessment & Plan Note (Signed)
Well controlled, no changes to meds. Encouraged heart healthy diet such as the DASH diet and exercise as tolerated.  °

## 2023-07-29 NOTE — Assessment & Plan Note (Signed)
Tolerating statin, encouraged heart healthy diet, avoid trans fats, minimize simple carbs and saturated fats. Increase exercise as tolerated 

## 2023-07-29 NOTE — Assessment & Plan Note (Signed)
No recent exacerbation

## 2023-07-29 NOTE — Assessment & Plan Note (Signed)
On Levothyroxine, continue to monitor 

## 2023-07-29 NOTE — Assessment & Plan Note (Signed)
hgba1c acceptable, minimize simple carbs. Increase exercise as tolerated. Continue current meds

## 2023-07-30 ENCOUNTER — Encounter: Payer: Self-pay | Admitting: Family Medicine

## 2023-07-30 ENCOUNTER — Ambulatory Visit (INDEPENDENT_AMBULATORY_CARE_PROVIDER_SITE_OTHER): Payer: Medicare HMO | Admitting: Family Medicine

## 2023-07-30 ENCOUNTER — Ambulatory Visit: Payer: Medicare HMO | Admitting: Podiatry

## 2023-07-30 VITALS — BP 128/64 | HR 64 | Temp 95.0°F | Resp 16 | Ht 69.0 in | Wt 266.0 lb

## 2023-07-30 DIAGNOSIS — I159 Secondary hypertension, unspecified: Secondary | ICD-10-CM

## 2023-07-30 DIAGNOSIS — E039 Hypothyroidism, unspecified: Secondary | ICD-10-CM | POA: Diagnosis not present

## 2023-07-30 DIAGNOSIS — F32A Depression, unspecified: Secondary | ICD-10-CM

## 2023-07-30 DIAGNOSIS — E785 Hyperlipidemia, unspecified: Secondary | ICD-10-CM | POA: Diagnosis not present

## 2023-07-30 DIAGNOSIS — J432 Centrilobular emphysema: Secondary | ICD-10-CM

## 2023-07-30 DIAGNOSIS — E1149 Type 2 diabetes mellitus with other diabetic neurological complication: Secondary | ICD-10-CM | POA: Diagnosis not present

## 2023-07-30 DIAGNOSIS — F419 Anxiety disorder, unspecified: Secondary | ICD-10-CM | POA: Diagnosis not present

## 2023-07-30 DIAGNOSIS — E1151 Type 2 diabetes mellitus with diabetic peripheral angiopathy without gangrene: Secondary | ICD-10-CM

## 2023-07-30 DIAGNOSIS — Z23 Encounter for immunization: Secondary | ICD-10-CM

## 2023-07-30 DIAGNOSIS — E1169 Type 2 diabetes mellitus with other specified complication: Secondary | ICD-10-CM | POA: Diagnosis not present

## 2023-07-30 MED ORDER — ALPRAZOLAM 0.5 MG PO TABS: 0.5000 mg | ORAL_TABLET | Freq: Two times a day (BID) | ORAL | 2 refills | Status: DC | PRN
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
## 2023-07-30 NOTE — Patient Instructions (Addendum)
Shingrix is the new shingles shot, 2 shots over 2-6 months, confirm coverage with insurance and document, then can return here for shots with nurse appt or at pharmacy   RSV, Respiratory Syncitial Virus Vaccine, Arexvy at pharmacy  Annual flu and covid boosters for cold and flu season   Intermittent Claudication Intermittent claudication is pain in one leg or both legs that occurs when walking or exercising and goes away when resting. This condition is a symptom of peripheral vascular disease (PVD). PVD is a disease of the blood vessels. This condition is commonly treated with physical activity, medicine, and lifestyle changes. If medical management does not improve symptoms, surgery may be done to restore blood flow. This surgery is called revascularization. What are the causes?  This condition is caused by a buildup of fatty material and other substances (plaque) within the arteries (atherosclerosis). Plaque makes arteries stiff and narrow, preventing proper blood flow to the leg muscles. Pain occurs when you walk or exercise because your muscles need more blood when you are moving and exercising but cannot get it because of poor blood flow. What increases the risk? The following factors may make you more likely to develop this condition: Smoking cigarettes. A personal history of stroke or heart disease. Age. The older you are, the higher the risk. Being inactive (sedentary lifestyle) or being overweight. A family history of atherosclerosis. Having another health condition, such as: Diabetes. High blood pressure. High cholesterol. What are the signs or symptoms? Symptoms of this condition can be in one leg or both legs, and can occur in the feet, calf, thigh, hip, or buttock over time. Symptoms may include: Aches or pains when walking. Cramps. A feeling of tightness, weakness, or heaviness. A wound on the lower leg or foot that heals poorly or does not heal. How is this  diagnosed? This condition may be diagnosed based on: Your symptoms. Your medical history. A physical exam. Tests, such as: Ankle-brachial index ([name]) to check blood pressure in the legs and compare it to the pressure in the arms. Exercise ankle-brachial test. For this test, you walk on a treadmill. Tests are done to check how the condition affects your ability to walk or exercise. Arterial duplex ultrasound to view how blood flows within arteries. CT angiogram (CTA). An X-ray machine is used to take pictures of the blood vessels after dye is injected. Magnetic resonance angiogram (MRA). This creates images of blood vessels and blood flow within them. Angiogram. In this procedure, dye is injected into arteries and then X-rays are taken. Blood tests. How is this treated? Treatment for this condition involves treating the underlying cause and managing risk factors, such as high blood pressure, high cholesterol, or diabetes. Treatment may include: Lifestyle changes, such as: Starting a supervised or home-based exercise program. Losing weight. Quitting smoking. Medicines to help restore blood flow through your legs. If you have symptoms that affect your everyday activities, or have a wound that is not healing, treatment may include: Angioplasty to open a blocked artery using an inflated balloon. Stent implant to open a blocked artery using a mesh-like tube. Surgery to restore blood flow by creating a bypass around a blocked area. Follow these instructions at home: Lifestyle  Maintain a healthy weight. Eat a diet that is low in saturated fats and calories. Consider working with a dietitianto help you make healthy food choices. Do not use any products that contain nicotine or tobacco. These products include cigarettes, chewing tobacco, and vaping devices, such as  e-cigarettes. If you need help quitting, ask your health care provider. If your health care provider recommended an exercise program  for you, follow it as directed. Your exercise program may involve: Walking three or more times a week. Walking until you have certain symptoms of intermittent claudication, resting until your symptoms go away, and then resuming your walk. Gradually increasing your total walking time to about 50 minutes a day. General instructions Work with your health care provider to manage other health conditions that may increase your risk, including diabetes, high blood pressure, or high cholesterol. Take over-the-counter and prescription medicines only as told by your health care provider. Keep all follow-up visits. This is important. Contact a health care provider if: Your pain does not go away with rest. You have sores on your legs that do not heal or have pus or a bad smell. Your condition gets worse or does not improve with treatment. Get help right away if: You have chest pain. You have trouble breathing. Your foot or leg is cold or it changes color. Your foot or leg becomes numb. You have any symptoms of a stroke. "BE FAST" is an easy way to remember the main warning signs of a stroke: B - Balance. Signs are dizziness, sudden trouble walking, or loss of balance. E - Eyes. Signs are trouble seeing or a sudden change in vision. F - Face. Signs are sudden weakness or numbness of the face, or the face or eyelid drooping on one side. A - Arms. Signs are weakness or numbness in an arm. This happens suddenly and usually on one side of the body. S - Speech. Signs are sudden trouble speaking, slurred speech, or trouble understanding what people say. T - Time. Time to call emergency services. Write down what time symptoms started. You have other signs of a stroke, such as: A sudden, severe headache with no known cause. Nausea or vomiting. Seizure. These symptoms may represent a serious problem that is an emergency. Do not wait to see if the symptoms resolve. Get medical help right away. Call your local  emergency services (911 in the [name]). Do not drive yourself to the hospital.  Summary Intermittent claudication is pain in the leg or legs that occurs when walking or exercising and goes away when resting. This condition is caused by a buildup of plaque in the arteries. Plaque makes arteries stiff and narrow, which prevents proper blood flow to the leg. Intermittent claudication can be treated with medicine and lifestyle changes. If these fail, surgery may be done to restore blood flow to the affected area. Work with your health care provider to manage other health conditions you may have, including diabetes, high blood pressure, or high cholesterol. This information is not intended to replace advice given to you by your health care provider. Make sure you discuss any questions you have with your health care provider. Document Revised: 03/29/2020 Document Reviewed: 04/25/2020 Elsevier Patient Education  2024 [employer].

## 2023-07-30 NOTE — Assessment & Plan Note (Signed)
Is describing pain in bilateral inner thighs when he walks, it improves with rest. Consider intermittent claudication and wear oxygen as prescribed when ambulating. He has follow up appt with cardiology and vascular surgery pending. He will discuss there.

## 2023-07-30 NOTE — Assessment & Plan Note (Signed)
Stable on current meds, no changes given refill on Alprazolam today

## 2023-08-01 ENCOUNTER — Other Ambulatory Visit: Payer: Self-pay | Admitting: Family Medicine

## 2023-08-06 ENCOUNTER — Ambulatory Visit: Payer: Medicare HMO | Admitting: Family Medicine

## 2023-08-10 ENCOUNTER — Other Ambulatory Visit: Payer: Self-pay | Admitting: Family Medicine

## 2023-08-10 ENCOUNTER — Encounter: Payer: Self-pay | Admitting: Family Medicine

## 2023-08-12 ENCOUNTER — Other Ambulatory Visit: Payer: Self-pay

## 2023-08-12 NOTE — Telephone Encounter (Signed)
Requesting: Lyrica 150mg   Contract: 06/05/23 UDS: 05/07/23 Last Visit: 07/30/23 Next Visit: 11/19/23 Last Refill: 01/07/23 #180 and 1RF  Please Advise

## 2023-08-13 ENCOUNTER — Encounter: Payer: Self-pay | Admitting: Cardiovascular Disease

## 2023-08-13 ENCOUNTER — Ambulatory Visit: Payer: Medicare HMO | Attending: Cardiovascular Disease | Admitting: Cardiovascular Disease

## 2023-08-13 VITALS — BP 110/60 | HR 57 | Ht 69.0 in | Wt 256.2 lb

## 2023-08-13 DIAGNOSIS — E1169 Type 2 diabetes mellitus with other specified complication: Secondary | ICD-10-CM | POA: Diagnosis not present

## 2023-08-13 DIAGNOSIS — I7143 Infrarenal abdominal aortic aneurysm, without rupture: Secondary | ICD-10-CM

## 2023-08-13 DIAGNOSIS — I10 Essential (primary) hypertension: Secondary | ICD-10-CM | POA: Diagnosis not present

## 2023-08-13 DIAGNOSIS — I701 Atherosclerosis of renal artery: Secondary | ICD-10-CM | POA: Diagnosis not present

## 2023-08-13 DIAGNOSIS — E785 Hyperlipidemia, unspecified: Secondary | ICD-10-CM | POA: Diagnosis not present

## 2023-08-13 DIAGNOSIS — I6523 Occlusion and stenosis of bilateral carotid arteries: Secondary | ICD-10-CM | POA: Diagnosis not present

## 2023-08-13 NOTE — Assessment & Plan Note (Signed)
History of essential hypertension with blood pressure measured today at 110/60.  He is on carvedilol, and losartan.

## 2023-08-13 NOTE — Assessment & Plan Note (Signed)
History of mild bilateral renal artery stenosis which we have been following by duplex ultrasound last checked 05/25/2022 revealing stable right renal dimensions and renal aortic ratios.

## 2023-08-13 NOTE — Progress Notes (Signed)
08/13/2023 [name]   30-Dec-1945  [id number]  Primary Physician Donald Canary, MD Primary Cardiologist: Donald Gess MD FACP, [place], [place], [place]  HPI:  [name] is a 77 y.o.   moderately overweight married Caucasian male father of 3, grandfather of 6 grandchildren who I last saw in the office 05/04/2022 thank you.  He is accompanied by his wife [name] today.  He was referred by Dr. Abner Park for peripheral vascular evaluation because of a recent CTA that showed a 4 cm abdominal aortic aneurysm and left iliac stenosis.  He does have a history of 60-pack-year tobacco abuse having quit 2008.  He also has treated hypertension, diabetes and hyperlipidemia.  He had a stroke back in 2007 but is never had a heart attack.  Does complain of some dyspnea probably related to COPD but denies chest pain.  He has a known abdominal aortic aneurysm, renal artery stenosis and left iliac stenosis.  He has also had bilateral carotid endarterectomies remotely.  Abdominal ultrasound performed 10/01/2016 did show a moderately severe left common iliac artery stenosis with a aortic dimension of 3.5 x 3.7 cm.  Recent CTA performed 09/18/2018 revealed an aortic dimension of 3.7 x 4 cm with what appeared to be a left common iliac artery stenosis.  Does have some left lower extremity discomfort with exercise consistent with claudication but this does not appear to be lifestyle limiting at this time.   He was admitted January 2021 with COVID-pneumonia which she recovered from.  He also had critical limb ischemia and underwent left great toe amputation with "kissing balloon" stenting using VBX stents by Dr. Chestine Park 05/28/2019.    Lower extremity Dopplers performed on 03/17/2021 revealed widely patent stents with normal ABIs bilaterally.   Since I saw him a year ago he continues to do well.  He denies chest pain but does have some shortness of breath probably attributable to his COPD.  He is placed on a pulmonary  medicine for this.  He did have lower extremity arterial Doppler studies that showed widely patent iliac stents with an abdominal aortic aneurysm measuring 4.3 cm.     Active Medications      Current Meds  Medication Sig   Accu-Chek Softclix Lancets lancets USE TO CHECK BLOOD SUGAR 3 TO 4  TIMES WEEKLY   albuterol (PROAIR HFA) 108 (90 Base) MCG/ACT inhaler Inhale 2 puffs into the lungs every 6 (six) hours as needed for shortness of breath.   ALPRAZolam (XANAX) 0.5 MG tablet Take 1 tablet (0.5 mg total) by mouth 2 (two) times daily as needed for anxiety.   Ascorbic Acid (VITAMIN C) 1000 MG tablet Take 1,000 mg by mouth daily.   aspirin EC 81 MG tablet Take 81 mg by mouth at bedtime.   atorvastatin (LIPITOR) 80 MG tablet TAKE 1 TABLET BY MOUTH  DAILY AT 6 PM   BD PEN NEEDLE NANO 2ND GEN 32G X 4 MM MISC USE AS DIRECTED WITH HUMALOG PEN   Blood Glucose Monitoring Suppl (ACCU-CHEK GUIDE ME) w/Device KIT USE TO CHECK BLOOD SUGAR 3  TO 4 TIMES WEEKLY.  DX CODE E11.9   Budeson-Glycopyrrol-Formoterol (BREZTRI AEROSPHERE) 160-9-4.8 MCG/ACT AERO Inhale 2 puffs into the lungs in the morning and at bedtime.   carvedilol (COREG) 25 MG tablet TAKE ONE-HALF TABLET BY  MOUTH TWICE DAILY WITH  MEALS   cholecalciferol (VITAMIN D) 1000 UNITS tablet Take 1,000 Units by mouth daily.   clobetasol (OLUX) 0.05 % topical  foam APPLY TO AFFECTED AREA TWICE A DAY   clopidogrel (PLAVIX) 75 MG tablet TAKE 1 TABLET (75 MG TOTAL) BY MOUTH DAILY WITH BREAKFAST.   FLUoxetine (PROZAC) 20 MG capsule Take 1 capsule (20 mg total) by mouth daily.   glimepiride (AMARYL) 2 MG tablet TAKE 1 TABLET BY MOUTH  TWICE DAILY   glucose blood (ACCU-CHEK GUIDE) test strip Use to check blood sugar 3-4 times a week.  Dx code: E11.9   HYDROcodone-acetaminophen (NORCO) 10-325 MG tablet Take 1 tablet by mouth every 12 (twelve) hours as needed for moderate pain.   insulin lispro (HUMALOG KWIKPEN) 100 UNIT/ML KwikPen Before each meal 3 times a day,  140-199 - 3 units, 200-250 - 6 units, 251-299 - 8 units,  300-349 - 10 units,  350 or above 12 units. Insulin PEN if approved, provide syringes and needles if needed.   Latanoprost 0.005 % EMUL Place 1 drop into both eyes in the morning and at bedtime.   levothyroxine (SYNTHROID) 25 MCG tablet Take 1 tablet (25 mcg total) by mouth daily.   losartan (COZAAR) 50 MG tablet TAKE 1 TABLET BY MOUTH  DAILY   Magnesium 500 MG TABS Take 500 mg by mouth daily.   Multiple Vitamin (MULTIVITAMIN) tablet Take 1 tablet by mouth daily.   Omega-3 Fatty Acids (FISH OIL) 1000 MG CAPS Take 1,000 mg by mouth 2 (two) times daily.   pregabalin (LYRICA) 150 MG capsule Take 1 capsule (150 mg total) by mouth 2 (two) times daily.   sertraline (ZOLOFT) 50 MG tablet TAKE 1 TABLET BY MOUTH  DAILY   vitamin B-12 (CYANOCOBALAMIN) 1000 MCG tablet Take 1,000 mcg by mouth See admin instructions. Take one tablet by mouth twice weekly on Monday and Thursday per wife   zinc gluconate 50 MG tablet Take 50 mg by mouth daily.        Allergies      Allergies  Allergen Reactions   Metformin Diarrhea   Prednisone Itching        Social History         Socioeconomic History   Marital status: Married      Spouse name: [name]   Number of children: 3   Years of education: 12th grade   Highest education level: Not on file  Occupational History   Occupation: Maintenance      Comment: Primary Care at [employer]  Tobacco Use   Smoking status: Former      Packs/day: 2.00      Years: 40.00      Total pack years: 80.00      Types: Cigarettes, Cigars      Quit date: 05/04/2006      Years since quitting: 16.0   Smokeless tobacco: Never  Vaping Use   Vaping Use: Never used  Substance and Sexual Activity   Alcohol use: Not Currently      Alcohol/week: 0.0 standard drinks of alcohol      Comment: rare - 11/10/2012 "quit > 20 yr ago"   Drug use: No   Sexual activity: Not Currently  Other Topics Concern   Not on file  Social  History Narrative    Lives with wife (1993), no pets    Grown children.    Occupation: retired, Music therapist, Office manager at [employer])    Activity: golf, gardening     Diet: good water, fruits/vegetables daily    Social Determinants of Health        Financial Resource Strain: Medium Risk (08/23/2021)  Overall Financial Resource Strain (CARDIA)     Difficulty of Paying Living Expenses: Somewhat hard  Food Insecurity: No Food Insecurity (08/23/2021)    Hunger Vital Sign     Worried About Running Out of Food in the Last Year: Never true     Ran Out of Food in the Last Year: Never true  Transportation Needs: No Transportation Needs (08/23/2021)    PRAPARE - Therapist, art (Medical): No     Lack of Transportation (Non-Medical): No  Physical Activity: Insufficiently Active (08/23/2021)    Exercise Vital Sign     Days of Exercise per Week: 3 days     Minutes of Exercise per Session: 20 min  Stress: No Stress Concern Present (08/23/2021)    [employer] of Occupational Health - Occupational Stress Questionnaire     Feeling of Stress : Only a little  Social Connections: Moderately Integrated (08/23/2021)    Social Connection and Isolation Panel [NHANES]     Frequency of Communication with Friends and Family: Twice a week     Frequency of Social Gatherings with Friends and Family: Once a week     Attends Religious Services: More than 4 times per year     Active Member of [employer] or Organizations: No     Attends Banker Meetings: Never     Marital Status: Married  Catering manager Violence: Not At Risk (08/23/2021)    Humiliation, Afraid, Rape, and Kick questionnaire     Fear of Current or Ex-Partner: No     Emotionally Abused: No     Physically Abused: No     Sexually Abused: No      Review of Systems: General: negative for chills, fever, night sweats or weight changes.  Cardiovascular: negative for chest pain, dyspnea on exertion,  edema, orthopnea, palpitations, paroxysmal nocturnal dyspnea or shortness of breath Dermatological: negative for rash Respiratory: negative for cough or wheezing Urologic: negative for hematuria Abdominal: negative for nausea, vomiting, diarrhea, bright red blood per rectum, melena, or hematemesis Neurologic: negative for visual changes, syncope, or dizziness All other systems reviewed and are otherwise negative except as noted above.       Blood pressure 128/70, pulse (!) 59, height 5\' 9"  (1.753 m), weight 244 lb (110.7 kg).  General appearance: alert and no distress Neck: no adenopathy, no JVD, supple, symmetrical, trachea midline, thyroid not enlarged, symmetric, no tenderness/mass/nodules, and soft right carotid bruit Lungs: clear to auscultation bilaterally Heart: regular rate and rhythm, S1, S2 normal, no murmur, click, rub or gallop Extremities: extremities normal, atraumatic, no cyanosis or edema Pulses: 2+ and symmetric Skin: Skin color, texture, turgor normal. No rashes or lesions Neurologic: Grossly normal   EKG sinus bradycardia 59 with right bundle branch block.  I personally reviewed this EKG.   ASSESSMENT AND PLAN:    PVD (peripheral vascular disease) (HCC) History of peripheral arterial disease with a known small abdominal aortic aneurysm.  He is status post bilateral iliac stenting using "kissing stent technique and VBX stents by Dr. Chestine Park 05/28/2019 in the setting of critical limb ischemia after which he underwent left great toe amputation.  His Dopplers performed 03/17/2021 revealed normal ABIs bilaterally with widely patent stents.  His abdominal aorta at that time measured 4.3 cm.  He denies claudication.  We will repeat aortoiliac and lower extremities show Doppler studies.   Hypertension associated with type 2 diabetes mellitus (HCC) History of essential hypertension blood pressure measured today at 128/70.  He is on carvedilol and losartan.   Renal artery stenosis  (HCC) History of right renal artery stenosis demonstrated by Dr. Chestine Park at the time of angiography.  His most recent renal Doppler studies performed 04/18/2021 revealed renal aortic ratios of 4.65 on the right and 4.39 on the left with preserved renal dimensions.  We will recheck renal Doppler studies.   Carotid artery disease (HCC) History of remote bilateral carotid endarterectomies.  His last carotid Doppler study performed 05/27/2019 revealed widely patent endarterectomy sites.  We will repeat carotid Doppler studies.  He does have a soft right carotid Emmalee Solivan.   Hyperlipidemia associated with type 2 diabetes mellitus (HCC) History of hyperlipidemia on high-dose statin therapy with lipid profile performed 01/30/2022 revealing total cholesterol 144, LDL 61 and HDL 30.         Donald Gess MD FACP,FACC,FAHA, [facility] 05/04/2022 11:10 AM          Current Meds  Medication Sig   albuterol (PROVENTIL) (2.5 MG/3ML) 0.083% nebulizer solution Take 3 mLs (2.5 mg total) by nebulization every 6 (six) hours as needed for wheezing or shortness of breath.   ALPRAZolam (XANAX) 0.5 MG tablet Take 1 tablet (0.5 mg total) by mouth 2 (two) times daily as needed for anxiety.   arformoterol (BROVANA) 15 MCG/2ML NEBU Take 2 mLs (15 mcg total) by nebulization daily.   Ascorbic Acid (VITAMIN C) 1000 MG tablet Take 1,000 mg by mouth daily.   aspirin EC 81 MG tablet Take 81 mg by mouth at bedtime.   atorvastatin (LIPITOR) 80 MG tablet Take 1 tablet (80 mg total) by mouth daily.   BD PEN NEEDLE NANO 2ND GEN 32G X 4 MM MISC USE AS DIRECTED WITH HUMALOG PEN   Blood Glucose Monitoring Suppl (ACCU-CHEK AVIVA PLUS) w/Device KIT Check blood sugars three times daily   budesonide (PULMICORT) 0.25 MG/2ML nebulizer solution Take 2 mLs (0.25 mg total) by nebulization daily.   carvedilol (COREG) 25 MG tablet TAKE ONE-HALF TABLET BY  MOUTH TWICE DAILY WITH  MEALS   cholecalciferol (VITAMIN D) 1000 UNITS tablet Take 1,000  Units by mouth daily.   clopidogrel (PLAVIX) 75 MG tablet TAKE 1 TABLET BY MOUTH EVERY DAY WITH BREAKFAST   ezetimibe (ZETIA) 10 MG tablet Take 1 tablet (10 mg total) by mouth daily.   FLUoxetine (PROZAC) 40 MG capsule Take 1 capsule (40 mg total) by mouth daily.   glimepiride (AMARYL) 2 MG tablet TAKE 1 TABLET BY MOUTH TWICE  DAILY   glucose blood (ACCU-CHEK AVIVA PLUS) test strip Check blood sugars three times daily   HYDROcodone-acetaminophen (NORCO) 10-325 MG tablet Take 1 tablet by mouth every 12 (twelve) hours as needed for moderate pain.   insulin lispro (HUMALOG KWIKPEN) 100 UNIT/ML KwikPen Before each meal 3 times a day, 140-199 - 3 units, 200-250 - 6 units, 251-299 - 8 units,  300-349 - 10 units,  350 or above 12 units. Insulin PEN if approved, provide syringes and needles if needed.   Lancets (ACCU-CHEK MULTICLIX) lancets Check blood sugars three times daily   Latanoprost 0.005 % EMUL Place 1 drop into both eyes in the morning and at bedtime.   levothyroxine (SYNTHROID) 25 MCG tablet TAKE 1 TABLET BY MOUTH EVERY DAY   losartan (COZAAR) 50 MG tablet Take 1 tablet (50 mg total) by mouth daily.   Magnesium 500 MG TABS Take 500 mg by mouth daily.   memantine (NAMENDA) 5 MG tablet Take 1 tablet (5 mg total) by mouth 2 (  two) times daily.   Multiple Vitamin (MULTIVITAMIN) tablet Take 1 tablet by mouth daily.   Omega-3 Fatty Acids (FISH OIL) 1000 MG CAPS Take 1,000 mg by mouth 2 (two) times daily.   pioglitazone (ACTOS) 30 MG tablet Take 1 tablet (30 mg total) by mouth daily.   pregabalin (LYRICA) 150 MG capsule TAKE 1 CAPSULE BY MOUTH TWICE A DAY   vitamin B-12 (CYANOCOBALAMIN) 1000 MCG tablet Take 1,000 mcg by mouth See admin instructions. Take one tablet by mouth twice weekly on Monday and Thursday per wife   zinc gluconate 50 MG tablet Take 50 mg by mouth daily.     Allergies  Allergen Reactions   Metformin Diarrhea   Prednisone Itching    Social History   Socioeconomic History    Marital status: Married    Spouse name: [name]   Number of children: 3   Years of education: 12   Highest education level: 12th grade  Occupational History   Occupation: Maintenance    Comment: Primary Care at [employer]  Tobacco Use   Smoking status: Former    Current packs/day: 0.00    Average packs/day: 2.0 packs/day for 40.0 years (80.0 ttl pk-yrs)    Types: Cigarettes, Cigars    Start date: 05/04/1966    Quit date: 05/04/2006    Years since quitting: 17.2   Smokeless tobacco: Never  Vaping Use   Vaping status: Never Used  Substance and Sexual Activity   Alcohol use: Not Currently    Alcohol/week: 0.0 standard drinks of alcohol    Comment: rare - 11/10/2012 "quit > 20 yr ago"   Drug use: No   Sexual activity: Not Currently  Other Topics Concern   Not on file  Social History Narrative   Lives with wife (1993), no pets   Grown children.   Occupation: retired, Music therapist, Office manager at [employer])   Activity: golf, gardening    Diet: good water, fruits/vegetables daily   Right handed   One story home   Drinks caffeine prn   Social Determinants of Health   Financial Resource Strain: Low Risk  (04/30/2023)   Overall Financial Resource Strain (CARDIA)    Difficulty of Paying Living Expenses: Not very hard  Food Insecurity: Food Insecurity Present (04/30/2023)   Hunger Vital Sign    Worried About Running Out of Food in the Last Year: Sometimes true    Ran Out of Food in the Last Year: Never true  Transportation Needs: No Transportation Needs (04/30/2023)   PRAPARE - Administrator, Civil Service (Medical): No    Lack of Transportation (Non-Medical): No  Physical Activity: Unknown (04/30/2023)   Exercise Vital Sign    Days of Exercise per Week: 0 days    Minutes of Exercise per Session: Not on file  Stress: Stress Concern Present (04/30/2023)   [employer] of Occupational Health - Occupational Stress Questionnaire    Feeling of Stress : To some extent   Social Connections: Moderately Isolated (04/30/2023)   Social Connection and Isolation Panel [NHANES]    Frequency of Communication with Friends and Family: Twice a week    Frequency of Social Gatherings with Friends and Family: Never    Attends Religious Services: 1 to 4 times per year    Active Member of [employer] or Organizations: No    Attends Banker Meetings: Not on file    Marital Status: Married  Intimate Partner Violence: Not At Risk (08/23/2021)   Humiliation, Afraid, Rape, and  Kick questionnaire    Fear of Current or Ex-Partner: No    Emotionally Abused: No    Physically Abused: No    Sexually Abused: No     Review of Systems: General: negative for chills, fever, night sweats or weight changes.  Cardiovascular: negative for chest pain, dyspnea on exertion, edema, orthopnea, palpitations, paroxysmal nocturnal dyspnea or shortness of breath Dermatological: negative for rash Respiratory: negative for cough or wheezing Urologic: negative for hematuria Abdominal: negative for nausea, vomiting, diarrhea, bright red blood per rectum, melena, or hematemesis Neurologic: negative for visual changes, syncope, or dizziness All other systems reviewed and are otherwise negative except as noted above.    Blood pressure 110/60, pulse (!) 57, height 5\' 9"  (1.753 m), weight 256 lb 3.2 oz (116.2 kg), SpO2 93%.  General appearance: alert and no distress Neck: no adenopathy, no carotid bruit, no JVD, supple, symmetrical, trachea midline, and thyroid not enlarged, symmetric, no tenderness/mass/nodules Lungs: clear to auscultation bilaterally Heart: regular rate and rhythm, S1, S2 normal, no murmur, click, rub or gallop Extremities: extremities normal, atraumatic, no cyanosis or edema Pulses: 2+ and symmetric Skin: Skin color, texture, turgor normal. No rashes or lesions Neurologic: Grossly normal  EKG EKG Interpretation Date/Time:  Tuesday August 13 2023 10:24:34  EDT Ventricular Rate:  58 PR Interval:  198 QRS Duration:  138 QT Interval:  480 QTC Calculation: 471 R Axis:   103  Text Interpretation: Sinus bradycardia Right bundle branch block When compared with ECG of 08-Sep-2021 09:08, Premature atrial complexes are no longer Present Confirmed by Nanetta Batty [phone]) on 08/13/2023 10:28:47 AM    ASSESSMENT AND PLAN:   Hypertension History of essential hypertension with blood pressure measured today at 110/60.  He is on carvedilol, and losartan.  AAA (abdominal aortic aneurysm) without rupture History of abdominal aortic aneurysm measuring approximately 4 cm and followed by Dr. Chestine Park.  Renal artery stenosis History of mild bilateral renal artery stenosis which we have been following by duplex ultrasound last checked 05/25/2022 revealing stable right renal dimensions and renal aortic ratios.  Hyperlipidemia associated with type 2 diabetes mellitus History of hyperlipidemia on high-dose statin therapy with lipid profile performed 05/07/2023 revealing total cholesterol 137, LDL of 71 and HDL 36.     Donald Gess MD [facility], [facility] 08/13/2023 10:43 AM

## 2023-08-13 NOTE — Patient Instructions (Addendum)
Medication Instructions:  Your physician recommends that you continue on your current medications as directed. Please refer to the Current Medication list given to you today.  *If you need a refill on your cardiac medications before your next appointment, please call your pharmacy*   Lab Work: None  Testing/Procedures: Your physician has requested that you have a carotid duplex. This test is an ultrasound of the carotid arteries in your neck. It looks at blood flow through these arteries that supply the brain with blood. Allow one hour for this exam. There are no restrictions or special instructions.   Follow-Up: At [facility], you and your health needs are our priority.  As part of our continuing mission to provide you with exceptional heart care, we have created designated Provider Care Teams.  These Care Teams include your primary Cardiologist (physician) and Advanced Practice Providers (APPs -  Physician Assistants and Nurse Practitioners) who all work together to provide you with the care you need, when you need it.  Your next appointment:   12 month(s)  Provider:   Nanetta Batty, MD

## 2023-08-13 NOTE — Assessment & Plan Note (Signed)
History of hyperlipidemia on high-dose statin therapy with lipid profile performed 05/07/2023 revealing total cholesterol 137, LDL of 71 and HDL 36.

## 2023-08-13 NOTE — Assessment & Plan Note (Signed)
History of abdominal aortic aneurysm measuring approximately 4 cm and followed by Dr. Chestine Spore.

## 2023-08-20 ENCOUNTER — Ambulatory Visit: Payer: Medicare HMO | Admitting: Podiatry

## 2023-08-20 DIAGNOSIS — B351 Tinea unguium: Secondary | ICD-10-CM | POA: Diagnosis not present

## 2023-08-20 DIAGNOSIS — L84 Corns and callosities: Secondary | ICD-10-CM | POA: Diagnosis not present

## 2023-08-20 DIAGNOSIS — Z7901 Long term (current) use of anticoagulants: Secondary | ICD-10-CM

## 2023-08-20 DIAGNOSIS — E11621 Type 2 diabetes mellitus with foot ulcer: Secondary | ICD-10-CM

## 2023-08-20 DIAGNOSIS — L97529 Non-pressure chronic ulcer of other part of left foot with unspecified severity: Secondary | ICD-10-CM

## 2023-08-20 NOTE — Progress Notes (Signed)
Subjective: No chief complaint on file.   77 year old male presents the office for evaluation of preulcerative lesions on the left foot as well as for thick, elongated nails is not able to trim himself.  No open lesions that he reports.  He denies any drainage.  No other concerns.  Objective: AAO x3, NAD DP/PT pulses palpable bilaterally, CRT less than 3 seconds Pitting edema present bilaterally Hammertoe contractures present.  Ulceration of the second toe has healed there is a small preulcerative callus noted to this area as well as on debridement of the submetatarsal 1 area.  There is no drainage or pus.  There is no fluctuance or crepitation.  There is no malodor. Nails are hypertrophic, dystrophic, brittle, discolored, elongated 9. No surrounding redness or drainage.  Nails affected are 2 through 5 on the left and 1 through 5 on the right. There is no pain with calf compression, erythema or warmth.  Assessment: 77 year old male with symptomatic onychosis, preulcerative callus  Plan: -All treatment options discussed with the patient including all alternatives, risks, complications.  -Sharply debrided nails x 10 without any complications or bleeding -Sharply debrided the hyperkeratotic lesions x 2 without any complications or bleeding -Discussed moisturizer to the calluses as well as offloading at all times.  Daily foot inspection.  Glucose control.  Return in about 2 months (around 10/20/2023).  Vivi Barrack DPM

## 2023-08-23 ENCOUNTER — Ambulatory Visit (HOSPITAL_COMMUNITY)
Admission: RE | Admit: 2023-08-23 | Discharge: 2023-08-23 | Disposition: A | Discharge status: Home / Self Care | Payer: Medicare HMO | Source: Ambulatory Visit | Attending: Cardiovascular Disease | Admitting: Cardiovascular Disease

## 2023-08-23 DIAGNOSIS — I6523 Occlusion and stenosis of bilateral carotid arteries: Secondary | ICD-10-CM

## 2023-08-24 ENCOUNTER — Encounter: Payer: Self-pay | Admitting: Family Medicine

## 2023-08-25 ENCOUNTER — Other Ambulatory Visit: Payer: Self-pay | Admitting: Family Medicine

## 2023-08-26 ENCOUNTER — Other Ambulatory Visit: Payer: Self-pay | Admitting: Emergency Medicine

## 2023-08-26 MED ORDER — GLIMEPIRIDE 2 MG PO TABS: 2.0000 mg | ORAL_TABLET | Freq: Two times a day (BID) | ORAL | 2 refills | Status: DC

## 2023-09-03 ENCOUNTER — Ambulatory Visit (INDEPENDENT_AMBULATORY_CARE_PROVIDER_SITE_OTHER): Payer: Medicare HMO

## 2023-09-03 VITALS — Ht 69.0 in | Wt 256.0 lb

## 2023-09-03 DIAGNOSIS — Z0000 Encounter for general adult medical examination without abnormal findings: Secondary | ICD-10-CM

## 2023-09-03 NOTE — Patient Instructions (Addendum)
[name] , Thank you for taking time to come for your Medicare Wellness Visit. I appreciate your ongoing commitment to your health goals. Please review the following plan we discussed and let me know if I can assist you in the future.   Referrals/Orders/Follow-Ups/Clinician Recommendations:   This is a list of the screening recommended for you and due dates:  Health Maintenance  Topic Date Due   Zoster (Shingles) Vaccine (1 of 2) 08/06/2026*   Hemoglobin A1C  11/07/2023   Complete foot exam   11/10/2023   Eye exam for diabetics  12/01/2023   Yearly kidney function blood test for diabetes  05/06/2024   Yearly kidney health urinalysis for diabetes  05/06/2024   Medicare Annual Wellness Visit  09/02/2024   DTaP/Tdap/Td vaccine (2 - Tdap) 07/24/2028   Pneumonia Vaccine  Completed   Flu Shot  Completed   Hepatitis C Screening  Completed   HPV Vaccine  Aged Out   Colon Cancer Screening  Discontinued   COVID-19 Vaccine  Discontinued   Cologuard (Stool DNA test)  Discontinued  *Topic was postponed. The date shown is not the original due date.    Advanced directives: (In Chart) A copy of your advanced directives are scanned into your chart should your provider ever need it.  Next Medicare Annual Wellness Visit scheduled for next year: Yes
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
## 2023-09-08 ENCOUNTER — Other Ambulatory Visit: Payer: Self-pay | Admitting: Family Medicine

## 2023-09-09 ENCOUNTER — Other Ambulatory Visit: Payer: Self-pay | Admitting: Family Medicine

## 2023-09-09 MED ORDER — TRAMADOL HCL 50 MG PO TABS: 50.0000 mg | ORAL_TABLET | Freq: Three times a day (TID) | ORAL | 0 refills | Status: DC | PRN

## 2023-09-13 ENCOUNTER — Encounter: Payer: Self-pay | Admitting: Physician Assistant

## 2023-09-13 ENCOUNTER — Ambulatory Visit: Payer: Medicare HMO | Admitting: Physician Assistant

## 2023-09-13 VITALS — BP 111/62 | HR 73 | Ht 69.0 in | Wt 265.0 lb

## 2023-09-13 DIAGNOSIS — R4189 Other symptoms and signs involving cognitive functions and awareness: Secondary | ICD-10-CM

## 2023-09-13 MED ORDER — MEMANTINE HCL 5 MG PO TABS: 5.0000 mg | ORAL_TABLET | Freq: Two times a day (BID) | ORAL | 3 refills | Status: AC

## 2023-09-13 NOTE — Patient Instructions (Addendum)
It was a pleasure to see you today at our office.   Recommendations:  Follow up in 6 months  Continue B12 supplements Continue memantine 5 mg 2 times a day  Resume water exercise  for increasing mobility Consider psychotherapy for stress management, anxiety.    Whom to call:  Memory  decline, memory medications: Call our office [phone]   For psychiatric meds, mood meds: Please have your primary care physician manage these medications.   Counseling regarding caregiver distress, including caregiver depression, anxiety and issues regarding community resources, adult day care programs, adult living facilities, or memory care questions:   Feel free to contact Misty Lisabeth Register, Social Worker at [phone]   For assessment of decision of mental capacity and competency:  Call Dr. Erick Blinks, geriatric psychiatrist at [phone]  For guidance in geriatric dementia issues please call [facility] [phone]    If you have any severe symptoms of a stroke, or other severe issues such as confusion,severe chills or fever, etc call 911 or go to the ER as you may need to be evaluated further    RECOMMENDATIONS FOR ALL PATIENTS WITH MEMORY PROBLEMS: 1. Continue to exercise (Recommend 30 minutes of walking everyday, or 3 hours every week) 2. Increase social interactions - continue going to [place] and enjoy social gatherings with friends and family 3. Eat healthy, avoid fried foods and eat more fruits and vegetables 4. Maintain adequate blood pressure, blood sugar, and blood cholesterol level. Reducing the risk of stroke and cardiovascular disease also helps promoting better memory. 5. Avoid stressful situations. Live a simple life and avoid aggravations. Organize your time and prepare for the next day in anticipation. 6. Sleep well, avoid any interruptions of sleep and avoid any distractions in the bedroom that may interfere with adequate sleep quality 7. Avoid sugar,  avoid sweets as there is a strong link between excessive sugar intake, diabetes, and cognitive impairment We discussed the Mediterranean diet, which has been shown to help patients reduce the risk of progressive memory disorders and reduces cardiovascular risk. This includes eating fish, eat fruits and green leafy vegetables, nuts like almonds and hazelnuts, walnuts, and also use olive oil. Avoid fast foods and fried foods as much as possible. Avoid sweets and sugar as sugar use has been linked to worsening of memory function.  There is always a concern of gradual progression of memory problems. If this is the case, then we may need to adjust level of care according to patient needs. Support, both to the patient and caregiver, should then be put into place.    FALL PRECAUTIONS: Be cautious when walking. Scan the area for obstacles that may increase the risk of trips and falls. When getting up in the mornings, sit up at the edge of the bed for a few minutes before getting out of bed. Consider elevating the bed at the head end to avoid drop of blood pressure when getting up. Walk always in a well-lit room (use night lights in the walls). Avoid area rugs or power cords from appliances in the middle of the walkways. Use a walker or a cane if necessary and consider physical therapy for balance exercise. Get your eyesight checked regularly.  FINANCIAL OVERSIGHT: Supervision, especially oversight when making financial decisions or transactions is also recommended.  HOME SAFETY: Consider the safety of the kitchen when operating appliances like stoves, microwave oven, and blender. Consider having supervision and share cooking responsibilities until no longer able to participate in those.  Accidents with firearms and other hazards in the house should be identified and addressed as well.   ABILITY TO BE LEFT ALONE: If patient is unable to contact 911 operator, consider using LifeLine, or when the need is there,  arrange for someone to stay with patients. Smoking is a fire hazard, consider supervision or cessation. Risk of wandering should be assessed by caregiver and if detected at any point, supervision and safe proof recommendations should be instituted.  MEDICATION SUPERVISION: Inability to self-administer medication needs to be constantly addressed. Implement a mechanism to ensure safe administration of the medications.   DRIVING: Regarding driving, in patients with progressive memory problems, driving will be impaired. We advise to have someone else do the driving if trouble finding directions or if minor accidents are reported. Independent driving assessment is available to determine safety of driving.   If you are interested in the driving assessment, you can contact the following:  [facility] in [place] [phone]  [facility] [phone]  [facility] [phone] or [phone]   We have sent a referral to [facility] for your MRI and they will call you directly to schedule your appointment. They are located at [address] [facility]. If you need to contact them directly please call [phone].   Your provider has requested that you have labwork completed today. Please go to [facility] ([address]) on the second floor of this building before leaving the office today. You do not need to check in. If you are not called within 15 minutes please check with the front desk.
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
## 2023-09-15 ENCOUNTER — Other Ambulatory Visit: Payer: Self-pay | Admitting: Family Medicine

## 2023-09-15 ENCOUNTER — Emergency Department (HOSPITAL_COMMUNITY): Payer: Medicare HMO

## 2023-09-15 ENCOUNTER — Encounter (HOSPITAL_COMMUNITY): Payer: Self-pay

## 2023-09-15 ENCOUNTER — Other Ambulatory Visit: Payer: Self-pay

## 2023-09-15 ENCOUNTER — Inpatient Hospital Stay (HOSPITAL_COMMUNITY)
Admission: EM | Admit: 2023-09-15 | Discharge: 2023-09-20 | DRG: 279 | Disposition: A | Discharge status: Home Health | Payer: Medicare HMO | Attending: Internal Medicine | Admitting: Internal Medicine

## 2023-09-15 ENCOUNTER — Inpatient Hospital Stay (HOSPITAL_COMMUNITY): Payer: Medicare HMO

## 2023-09-15 DIAGNOSIS — Z9889 Other specified postprocedural states: Secondary | ICD-10-CM

## 2023-09-15 DIAGNOSIS — E039 Hypothyroidism, unspecified: Secondary | ICD-10-CM | POA: Diagnosis not present

## 2023-09-15 DIAGNOSIS — E11628 Type 2 diabetes mellitus with other skin complications: Secondary | ICD-10-CM | POA: Diagnosis not present

## 2023-09-15 DIAGNOSIS — F411 Generalized anxiety disorder: Secondary | ICD-10-CM | POA: Diagnosis present

## 2023-09-15 DIAGNOSIS — J9611 Chronic respiratory failure with hypoxia: Secondary | ICD-10-CM | POA: Diagnosis present

## 2023-09-15 DIAGNOSIS — E1165 Type 2 diabetes mellitus with hyperglycemia: Secondary | ICD-10-CM | POA: Diagnosis present

## 2023-09-15 DIAGNOSIS — Z79890 Hormone replacement therapy (postmenopausal): Secondary | ICD-10-CM

## 2023-09-15 DIAGNOSIS — L03116 Cellulitis of left lower limb: Secondary | ICD-10-CM | POA: Diagnosis not present

## 2023-09-15 DIAGNOSIS — Z832 Family history of diseases of the blood and blood-forming organs and certain disorders involving the immune mechanism: Secondary | ICD-10-CM

## 2023-09-15 DIAGNOSIS — R413 Other amnesia: Secondary | ICD-10-CM | POA: Diagnosis not present

## 2023-09-15 DIAGNOSIS — L02612 Cutaneous abscess of left foot: Secondary | ICD-10-CM | POA: Diagnosis not present

## 2023-09-15 DIAGNOSIS — M19072 Primary osteoarthritis, left ankle and foot: Secondary | ICD-10-CM | POA: Diagnosis present

## 2023-09-15 DIAGNOSIS — E11621 Type 2 diabetes mellitus with foot ulcer: Secondary | ICD-10-CM | POA: Diagnosis not present

## 2023-09-15 DIAGNOSIS — E1142 Type 2 diabetes mellitus with diabetic polyneuropathy: Secondary | ICD-10-CM | POA: Diagnosis present

## 2023-09-15 DIAGNOSIS — E66812 Obesity, class 2: Secondary | ICD-10-CM | POA: Diagnosis present

## 2023-09-15 DIAGNOSIS — I2510 Atherosclerotic heart disease of native coronary artery without angina pectoris: Secondary | ICD-10-CM | POA: Diagnosis not present

## 2023-09-15 DIAGNOSIS — Z8673 Personal history of transient ischemic attack (TIA), and cerebral infarction without residual deficits: Secondary | ICD-10-CM

## 2023-09-15 DIAGNOSIS — E1169 Type 2 diabetes mellitus with other specified complication: Principal | ICD-10-CM | POA: Diagnosis present

## 2023-09-15 DIAGNOSIS — J4489 Other specified chronic obstructive pulmonary disease: Secondary | ICD-10-CM | POA: Diagnosis present

## 2023-09-15 DIAGNOSIS — J432 Centrilobular emphysema: Secondary | ICD-10-CM

## 2023-09-15 DIAGNOSIS — R590 Localized enlarged lymph nodes: Secondary | ICD-10-CM | POA: Diagnosis present

## 2023-09-15 DIAGNOSIS — G4733 Obstructive sleep apnea (adult) (pediatric): Secondary | ICD-10-CM | POA: Diagnosis present

## 2023-09-15 DIAGNOSIS — L97524 Non-pressure chronic ulcer of other part of left foot with necrosis of bone: Secondary | ICD-10-CM | POA: Diagnosis not present

## 2023-09-15 DIAGNOSIS — E1149 Type 2 diabetes mellitus with other diabetic neurological complication: Secondary | ICD-10-CM | POA: Diagnosis not present

## 2023-09-15 DIAGNOSIS — I7143 Infrarenal abdominal aortic aneurysm, without rupture: Secondary | ICD-10-CM

## 2023-09-15 DIAGNOSIS — Z7902 Long term (current) use of antithrombotics/antiplatelets: Secondary | ICD-10-CM

## 2023-09-15 DIAGNOSIS — I96 Gangrene, not elsewhere classified: Secondary | ICD-10-CM | POA: Diagnosis present

## 2023-09-15 DIAGNOSIS — M869 Osteomyelitis, unspecified: Secondary | ICD-10-CM | POA: Diagnosis not present

## 2023-09-15 DIAGNOSIS — Z7951 Long term (current) use of inhaled steroids: Secondary | ICD-10-CM

## 2023-09-15 DIAGNOSIS — I70222 Atherosclerosis of native arteries of extremities with rest pain, left leg: Secondary | ICD-10-CM | POA: Diagnosis not present

## 2023-09-15 DIAGNOSIS — E1151 Type 2 diabetes mellitus with diabetic peripheral angiopathy without gangrene: Secondary | ICD-10-CM | POA: Diagnosis present

## 2023-09-15 DIAGNOSIS — Z833 Family history of diabetes mellitus: Secondary | ICD-10-CM

## 2023-09-15 DIAGNOSIS — Z8701 Personal history of pneumonia (recurrent): Secondary | ICD-10-CM

## 2023-09-15 DIAGNOSIS — M86172 Other acute osteomyelitis, left ankle and foot: Secondary | ICD-10-CM | POA: Diagnosis not present

## 2023-09-15 DIAGNOSIS — L97529 Non-pressure chronic ulcer of other part of left foot with unspecified severity: Secondary | ICD-10-CM | POA: Diagnosis not present

## 2023-09-15 DIAGNOSIS — Z7982 Long term (current) use of aspirin: Secondary | ICD-10-CM

## 2023-09-15 DIAGNOSIS — D72829 Elevated white blood cell count, unspecified: Secondary | ICD-10-CM | POA: Diagnosis present

## 2023-09-15 DIAGNOSIS — I739 Peripheral vascular disease, unspecified: Secondary | ICD-10-CM | POA: Diagnosis not present

## 2023-09-15 DIAGNOSIS — Z794 Long term (current) use of insulin: Secondary | ICD-10-CM | POA: Diagnosis not present

## 2023-09-15 DIAGNOSIS — M19031 Primary osteoarthritis, right wrist: Secondary | ICD-10-CM | POA: Diagnosis present

## 2023-09-15 DIAGNOSIS — I7090 Unspecified atherosclerosis: Secondary | ICD-10-CM | POA: Diagnosis not present

## 2023-09-15 DIAGNOSIS — E1152 Type 2 diabetes mellitus with diabetic peripheral angiopathy with gangrene: Principal | ICD-10-CM | POA: Diagnosis present

## 2023-09-15 DIAGNOSIS — Z8261 Family history of arthritis: Secondary | ICD-10-CM

## 2023-09-15 DIAGNOSIS — Z95820 Peripheral vascular angioplasty status with implants and grafts: Secondary | ICD-10-CM

## 2023-09-15 DIAGNOSIS — I10 Essential (primary) hypertension: Secondary | ICD-10-CM | POA: Diagnosis present

## 2023-09-15 DIAGNOSIS — Z803 Family history of malignant neoplasm of breast: Secondary | ICD-10-CM

## 2023-09-15 DIAGNOSIS — Z8616 Personal history of COVID-19: Secondary | ICD-10-CM

## 2023-09-15 DIAGNOSIS — Z89412 Acquired absence of left great toe: Secondary | ICD-10-CM

## 2023-09-15 DIAGNOSIS — E782 Mixed hyperlipidemia: Secondary | ICD-10-CM | POA: Diagnosis not present

## 2023-09-15 DIAGNOSIS — I714 Abdominal aortic aneurysm, without rupture, unspecified: Secondary | ICD-10-CM | POA: Diagnosis present

## 2023-09-15 DIAGNOSIS — Z818 Family history of other mental and behavioral disorders: Secondary | ICD-10-CM

## 2023-09-15 DIAGNOSIS — M7989 Other specified soft tissue disorders: Secondary | ICD-10-CM

## 2023-09-15 DIAGNOSIS — E669 Obesity, unspecified: Secondary | ICD-10-CM | POA: Diagnosis present

## 2023-09-15 DIAGNOSIS — Z801 Family history of malignant neoplasm of trachea, bronchus and lung: Secondary | ICD-10-CM

## 2023-09-15 DIAGNOSIS — Z888 Allergy status to other drugs, medicaments and biological substances status: Secondary | ICD-10-CM

## 2023-09-15 DIAGNOSIS — Z6839 Body mass index (BMI) 39.0-39.9, adult: Secondary | ICD-10-CM | POA: Diagnosis not present

## 2023-09-15 DIAGNOSIS — I701 Atherosclerosis of renal artery: Secondary | ICD-10-CM | POA: Diagnosis not present

## 2023-09-15 DIAGNOSIS — E785 Hyperlipidemia, unspecified: Secondary | ICD-10-CM | POA: Diagnosis present

## 2023-09-15 DIAGNOSIS — M1909 Primary osteoarthritis, other specified site: Secondary | ICD-10-CM | POA: Diagnosis present

## 2023-09-15 DIAGNOSIS — Z79899 Other long term (current) drug therapy: Secondary | ICD-10-CM

## 2023-09-15 DIAGNOSIS — Z7984 Long term (current) use of oral hypoglycemic drugs: Secondary | ICD-10-CM

## 2023-09-15 DIAGNOSIS — Z8679 Personal history of other diseases of the circulatory system: Secondary | ICD-10-CM

## 2023-09-15 DIAGNOSIS — Z87891 Personal history of nicotine dependence: Secondary | ICD-10-CM | POA: Diagnosis not present

## 2023-09-15 DIAGNOSIS — H409 Unspecified glaucoma: Secondary | ICD-10-CM | POA: Diagnosis present

## 2023-09-15 DIAGNOSIS — Z8249 Family history of ischemic heart disease and other diseases of the circulatory system: Secondary | ICD-10-CM

## 2023-09-15 DIAGNOSIS — Z8781 Personal history of (healed) traumatic fracture: Secondary | ICD-10-CM

## 2023-09-15 DIAGNOSIS — J449 Chronic obstructive pulmonary disease, unspecified: Secondary | ICD-10-CM | POA: Diagnosis present

## 2023-09-15 DIAGNOSIS — Z87442 Personal history of urinary calculi: Secondary | ICD-10-CM

## 2023-09-15 DIAGNOSIS — L089 Local infection of the skin and subcutaneous tissue, unspecified: Secondary | ICD-10-CM | POA: Diagnosis not present

## 2023-09-15 DIAGNOSIS — Z5941 Food insecurity: Secondary | ICD-10-CM

## 2023-09-15 DIAGNOSIS — Z9841 Cataract extraction status, right eye: Secondary | ICD-10-CM

## 2023-09-15 DIAGNOSIS — Z823 Family history of stroke: Secondary | ICD-10-CM

## 2023-09-15 DIAGNOSIS — Z85828 Personal history of other malignant neoplasm of skin: Secondary | ICD-10-CM

## 2023-09-15 DIAGNOSIS — Z5986 Financial insecurity: Secondary | ICD-10-CM

## 2023-09-15 DIAGNOSIS — Z961 Presence of intraocular lens: Secondary | ICD-10-CM | POA: Diagnosis present

## 2023-09-15 DIAGNOSIS — Z9842 Cataract extraction status, left eye: Secondary | ICD-10-CM

## 2023-09-15 DIAGNOSIS — Z820 Family history of epilepsy and other diseases of the nervous system: Secondary | ICD-10-CM

## 2023-09-15 LAB — HEMOGLOBIN A1C
Hgb A1c MFr Bld: 6.6 % — ABNORMAL HIGH (ref 4.8–5.6)
Mean Plasma Glucose: 142.72 mg/dL

## 2023-09-15 LAB — COMPREHENSIVE METABOLIC PANEL
ALT: 18 U/L (ref 0–44)
AST: 17 U/L (ref 15–41)
Albumin: 3.2 g/dL — ABNORMAL LOW (ref 3.5–5.0)
Alkaline Phosphatase: 69 U/L (ref 38–126)
Anion gap: 11 (ref 5–15)
BUN: 15 mg/dL (ref 8–23)
CO2: 26 mmol/L (ref 22–32)
Calcium: 9 mg/dL (ref 8.9–10.3)
Chloride: 100 mmol/L (ref 98–111)
Creatinine, Ser: 0.89 mg/dL (ref 0.61–1.24)
GFR, Estimated: >60 mL/min (ref >60)
Glucose, Bld: 142 mg/dL — ABNORMAL HIGH (ref 70–99)
Potassium: 4.6 mmol/L (ref 3.5–5.1)
Sodium: 137 mmol/L (ref 135–145)
Total Bilirubin: 0.7 mg/dL (ref <1.2)
Total Protein: 6.5 g/dL (ref 6.5–8.1)

## 2023-09-15 LAB — CBC WITH DIFFERENTIAL/PLATELET
Abs Immature Granulocytes: 0.07 10*3/uL (ref 0.00–0.07)
Basophils Absolute: 0 10*3/uL (ref 0.0–0.1)
Basophils Relative: 0 %
Eosinophils Absolute: 0.2 10*3/uL (ref 0.0–0.5)
Eosinophils Relative: 1 %
HCT: 42.1 % (ref 39.0–52.0)
Hemoglobin: 13.6 g/dL (ref 13.0–17.0)
Immature Granulocytes: 1 %
Lymphocytes Relative: 14 %
Lymphs Abs: 1.7 10*3/uL (ref 0.7–4.0)
MCH: 32.2 pg (ref 26.0–34.0)
MCHC: 32.3 g/dL (ref 30.0–36.0)
MCV: 99.5 fL (ref 80.0–100.0)
Monocytes Absolute: 0.9 10*3/uL (ref 0.1–1.0)
Monocytes Relative: 7 %
Neutro Abs: 9 10*3/uL — ABNORMAL HIGH (ref 1.7–7.7)
Neutrophils Relative %: 77 %
Platelets: 209 10*3/uL (ref 150–400)
RBC: 4.23 MIL/uL (ref 4.22–5.81)
RDW: 13.2 % (ref 11.5–15.5)
WBC: 11.8 10*3/uL — ABNORMAL HIGH (ref 4.0–10.5)
nRBC: 0 % (ref 0.0–0.2)

## 2023-09-15 LAB — C-REACTIVE PROTEIN: CRP: 6.9 mg/dL — ABNORMAL HIGH (ref <1.0)

## 2023-09-15 LAB — URINALYSIS, W/ REFLEX TO CULTURE (INFECTION SUSPECTED)
Bilirubin Urine: NEGATIVE
Glucose, UA: NEGATIVE mg/dL
Hgb urine dipstick: NEGATIVE
Ketones, ur: NEGATIVE mg/dL
Leukocytes,Ua: NEGATIVE
Nitrite: NEGATIVE
Protein, ur: NEGATIVE mg/dL
Specific Gravity, Urine: 1.019 (ref 1.005–1.030)
pH: 5 (ref 5.0–8.0)

## 2023-09-15 LAB — VAS US ABI WITH/WO TBI
Left ABI: 0.78
Right ABI: 1.02

## 2023-09-15 LAB — SEDIMENTATION RATE: Sed Rate: 36 mm/h — ABNORMAL HIGH (ref 0–16)

## 2023-09-15 LAB — GLUCOSE, CAPILLARY
Glucose-Capillary: 92 mg/dL (ref 70–99)
Glucose-Capillary: 162 mg/dL — ABNORMAL HIGH (ref 70–99)

## 2023-09-15 LAB — PREALBUMIN: Prealbumin: 17 mg/dL — ABNORMAL LOW (ref 18–38)

## 2023-09-15 LAB — I-STAT CG4 LACTIC ACID, ED: Lactic Acid, Venous: 1.3 mmol/L (ref 0.5–1.9)

## 2023-09-15 MED ORDER — SODIUM CHLORIDE 0.9 % IV SOLN: 2.0000 g | INTRAVENOUS | Status: DC

## 2023-09-15 MED ORDER — LEVOTHYROXINE SODIUM 25 MCG PO TABS: 25.0000 ug | ORAL_TABLET | Freq: Every day | ORAL | Status: DC

## 2023-09-15 MED ORDER — ARFORMOTEROL TARTRATE 15 MCG/2ML IN NEBU
15.0000 ug | INHALATION_SOLUTION | Freq: Every day | RESPIRATORY_TRACT | Status: DC
  Administered 2023-09-17: 15 ug via RESPIRATORY_TRACT
  Filled 2023-09-15 (×2): qty 2

## 2023-09-15 MED ORDER — LATANOPROST 0.005 % OP EMUL: 1.0000 [drp] | Freq: Two times a day (BID) | OPHTHALMIC | Status: DC

## 2023-09-15 MED ORDER — VANCOMYCIN HCL 1750 MG/350ML IV SOLN
1750.0000 mg | INTRAVENOUS | Status: DC
  Administered 2023-09-16 – 2023-09-18 (×3): 1750 mg via INTRAVENOUS
  Filled 2023-09-15 (×4): qty 350

## 2023-09-15 MED ORDER — SODIUM CHLORIDE 0.9 % IV SOLN: 1.0000 g | Freq: Once | INTRAVENOUS | Status: DC

## 2023-09-15 MED ORDER — ACETAMINOPHEN 325 MG PO TABS
650.0000 mg | ORAL_TABLET | Freq: Four times a day (QID) | ORAL | Status: DC | PRN
  Administered 2023-09-15 – 2023-09-18 (×2): 650 mg via ORAL
  Filled 2023-09-15 (×2): qty 2

## 2023-09-15 MED ORDER — PREGABALIN 50 MG PO CAPS
150.0000 mg | ORAL_CAPSULE | Freq: Two times a day (BID) | ORAL | Status: DC
  Administered 2023-09-15 – 2023-09-20 (×10): 150 mg via ORAL
  Filled 2023-09-15 (×4): qty 2
  Filled 2023-09-15: qty 1
  Filled 2023-09-15 (×5): qty 2

## 2023-09-15 MED ORDER — ATORVASTATIN CALCIUM 80 MG PO TABS
80.0000 mg | ORAL_TABLET | Freq: Every day | ORAL | Status: DC
  Administered 2023-09-16 – 2023-09-20 (×5): 80 mg via ORAL
  Filled 2023-09-15 (×5): qty 1

## 2023-09-15 MED ORDER — MEMANTINE HCL 10 MG PO TABS
5.0000 mg | ORAL_TABLET | Freq: Two times a day (BID) | ORAL | Status: DC
  Administered 2023-09-15 – 2023-09-20 (×10): 5 mg via ORAL
  Filled 2023-09-15 (×12): qty 1

## 2023-09-15 MED ORDER — VANCOMYCIN HCL 2000 MG/400ML IV SOLN
2000.0000 mg | Freq: Once | INTRAVENOUS | Status: AC
  Administered 2023-09-15: 2000 mg via INTRAVENOUS
  Filled 2023-09-15: qty 400

## 2023-09-15 MED ORDER — BUDESONIDE 0.25 MG/2ML IN SUSP
0.2500 mg | Freq: Every day | RESPIRATORY_TRACT | Status: DC
  Administered 2023-09-17 – 2023-09-20 (×4): 0.25 mg via RESPIRATORY_TRACT
  Filled 2023-09-15 (×5): qty 2

## 2023-09-15 MED ORDER — CARVEDILOL 12.5 MG PO TABS
12.5000 mg | ORAL_TABLET | Freq: Two times a day (BID) | ORAL | Status: DC
  Administered 2023-09-15 – 2023-09-20 (×9): 12.5 mg via ORAL
  Filled 2023-09-15 (×9): qty 1

## 2023-09-15 MED ORDER — ALBUTEROL SULFATE (2.5 MG/3ML) 0.083% IN NEBU: 2.5000 mg | INHALATION_SOLUTION | Freq: Four times a day (QID) | RESPIRATORY_TRACT | Status: DC | PRN

## 2023-09-15 MED ORDER — MAGNESIUM 500 MG PO TABS: 500.0000 mg | ORAL_TABLET | Freq: Every day | ORAL | Status: DC

## 2023-09-15 MED ORDER — FLUOXETINE HCL 20 MG PO CAPS
40.0000 mg | ORAL_CAPSULE | Freq: Every day | ORAL | Status: DC
  Administered 2023-09-16 – 2023-09-20 (×5): 40 mg via ORAL
  Filled 2023-09-15 (×5): qty 2

## 2023-09-15 MED ORDER — LATANOPROST 0.005 % OP SOLN
1.0000 [drp] | Freq: Two times a day (BID) | OPHTHALMIC | Status: DC
  Administered 2023-09-15 – 2023-09-20 (×10): 1 [drp] via OPHTHALMIC
  Filled 2023-09-15 (×2): qty 2.5

## 2023-09-15 MED ORDER — ALPRAZOLAM 0.5 MG PO TABS
0.5000 mg | ORAL_TABLET | Freq: Two times a day (BID) | ORAL | Status: DC | PRN
  Administered 2023-09-18 – 2023-09-19 (×2): 0.5 mg via ORAL
  Filled 2023-09-15 (×2): qty 1

## 2023-09-15 MED ORDER — LOSARTAN POTASSIUM 50 MG PO TABS
50.0000 mg | ORAL_TABLET | Freq: Every day | ORAL | Status: DC
  Administered 2023-09-16 – 2023-09-20 (×5): 50 mg via ORAL
  Filled 2023-09-15 (×5): qty 1

## 2023-09-15 MED ORDER — INSULIN ASPART 100 UNIT/ML IJ SOLN
0.0000 [IU] | Freq: Three times a day (TID) | INTRAMUSCULAR | Status: DC
  Administered 2023-09-16 (×2): 2 [IU] via SUBCUTANEOUS
  Administered 2023-09-16: 3 [IU] via SUBCUTANEOUS
  Administered 2023-09-17: 2 [IU] via SUBCUTANEOUS
  Administered 2023-09-17 – 2023-09-18 (×3): 3 [IU] via SUBCUTANEOUS
  Administered 2023-09-18: 2 [IU] via SUBCUTANEOUS
  Administered 2023-09-19 – 2023-09-20 (×2): 3 [IU] via SUBCUTANEOUS
  Administered 2023-09-20: 2 [IU] via SUBCUTANEOUS

## 2023-09-15 MED ORDER — ASPIRIN 81 MG PO TBEC
81.0000 mg | DELAYED_RELEASE_TABLET | Freq: Every day | ORAL | Status: DC
  Administered 2023-09-15 – 2023-09-19 (×5): 81 mg via ORAL
  Filled 2023-09-15 (×5): qty 1

## 2023-09-15 MED ORDER — VANCOMYCIN HCL IN DEXTROSE 1-5 GM/200ML-% IV SOLN: 1000.0000 mg | Freq: Once | INTRAVENOUS | Status: DC

## 2023-09-15 MED ORDER — FLUOXETINE HCL 40 MG PO CAPS: 40.0000 mg | ORAL_CAPSULE | Freq: Every day | ORAL | Status: DC

## 2023-09-15 MED ORDER — LEVOTHYROXINE SODIUM 25 MCG PO TABS
25.0000 ug | ORAL_TABLET | Freq: Every day | ORAL | Status: DC
  Administered 2023-09-16 – 2023-09-20 (×5): 25 ug via ORAL
  Filled 2023-09-15 (×5): qty 1

## 2023-09-15 MED ORDER — ACETAMINOPHEN 650 MG RE SUPP: 650.0000 mg | Freq: Four times a day (QID) | RECTAL | Status: DC | PRN

## 2023-09-15 MED ORDER — CLOPIDOGREL BISULFATE 75 MG PO TABS
75.0000 mg | ORAL_TABLET | Freq: Every day | ORAL | Status: DC
  Administered 2023-09-16 – 2023-09-20 (×4): 75 mg via ORAL
  Filled 2023-09-15 (×5): qty 1

## 2023-09-15 MED ORDER — SODIUM CHLORIDE 0.9 % IV SOLN
2.0000 g | Freq: Once | INTRAVENOUS | Status: AC
  Administered 2023-09-15: 2 g via INTRAVENOUS
  Filled 2023-09-15: qty 20

## 2023-09-15 MED ORDER — EZETIMIBE 10 MG PO TABS
10.0000 mg | ORAL_TABLET | Freq: Every day | ORAL | Status: DC
  Administered 2023-09-16 – 2023-09-20 (×5): 10 mg via ORAL
  Filled 2023-09-15 (×5): qty 1

## 2023-09-15 MED ORDER — ZINC GLUCONATE 50 MG PO TABS: 50.0000 mg | ORAL_TABLET | Freq: Every day | ORAL | Status: DC

## 2023-09-15 MED ORDER — SODIUM CHLORIDE 0.9% FLUSH
3.0000 mL | Freq: Two times a day (BID) | INTRAVENOUS | Status: DC
  Administered 2023-09-15 – 2023-09-20 (×11): 3 mL via INTRAVENOUS

## 2023-09-15 MED ORDER — ZINC SULFATE 220 (50 ZN) MG PO CAPS
220.0000 mg | ORAL_CAPSULE | Freq: Every day | ORAL | Status: DC
  Administered 2023-09-16 – 2023-09-20 (×5): 220 mg via ORAL
  Filled 2023-09-15 (×5): qty 1

## 2023-09-15 MED ORDER — MAGNESIUM GLUCONATE 500 MG PO TABS
500.0000 mg | ORAL_TABLET | Freq: Every day | ORAL | Status: DC
  Administered 2023-09-16 – 2023-09-20 (×5): 500 mg via ORAL
  Filled 2023-09-15 (×5): qty 1

## 2023-09-15 MED ORDER — ENOXAPARIN SODIUM 60 MG/0.6ML IJ SOSY
60.0000 mg | PREFILLED_SYRINGE | INTRAMUSCULAR | Status: DC
  Administered 2023-09-15 – 2023-09-17 (×3): 60 mg via SUBCUTANEOUS
  Filled 2023-09-15 (×3): qty 0.6

## 2023-09-15 NOTE — Progress Notes (Signed)
Pharmacy Antibiotic Note  [name] is a 77 y.o. male for which pharmacy has been consulted for vancomycin dosing for  DFI .  Patient with a history of HTN, HLD, CAD, PAD, hypothyroidism, AAA, COPD. Patient presenting with left second toe pain, drainage, and swelling of the left lower extremity.  SCr 0.89 WBC 11.8; LA 1.3; T 98.3; HR 76; RR 16  Plan: Ceftriaxone per MD Vancomycin 2000 mg once then 1750 mg q24hr (eAUC 462) unless change in renal function Monitor WBC, fever, renal function, cultures De-escalate when able Levels at steady state  Height: 5\' 9"  (175.3 cm) Weight: 120.2 kg (265 lb) IBW/kg (Calculated) : 70.7  Temp (24hrs), Avg:98.3 F (36.8 C), Min:98.3 F (36.8 C), Max:98.3 F (36.8 C)  Recent Labs  Lab 09/15/23 1007 09/15/23 1017  WBC 11.8*  --   CREATININE 0.89  --   LATICACIDVEN  --  1.3    Estimated Creatinine Clearance: 90.4 mL/min (by C-G formula based on SCr of 0.89 mg/dL).    Allergies  Allergen Reactions   Metformin Diarrhea   Prednisone Itching   Microbiology results: Pending  Thank you for allowing pharmacy to be a part of this patient's care.  Delmar Landau, PharmD, BCPS 09/15/2023 2:48 PM ED Clinical Pharmacist -  [phone]
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
## 2023-09-15 NOTE — ED Triage Notes (Signed)
Patient has had left great toe removed awhile ago but now has a sore to 3rd digit toe and bottom of left foot along with streaking swelling and warmth to touch.  Swelling extends up left leg. Patient has COPD and typically wears 4L [place] but did not bring it today.

## 2023-09-15 NOTE — Progress Notes (Signed)
ED Pharmacy Antibiotic Sign Off An antibiotic consult was received from an ED provider for vancomycin per pharmacy dosing for  c/f osteomyelitis . A chart review was completed to assess appropriateness.  A single dose of ceftriaxone 2000 mg was placed by the ED provider.   The following one time order(s) were placed per pharmacy consult:  vancomycin 2000 mg x 1 dose  Further antibiotic and/or antibiotic pharmacy consults should be ordered by the admitting provider if indicated.   Thank you for allowing pharmacy to be a part of this patient's care.   Delmar Landau, PharmD, BCPS 09/15/2023 11:33 AM ED Clinical Pharmacist -  [phone]

## 2023-09-15 NOTE — Progress Notes (Signed)
[name] and LLE venous duplex has been completed.    Results can be found under chart review under CV PROC. 09/15/2023 3:42 PM Tuwanda Vokes RVT, RDMS
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
## 2023-09-15 NOTE — Plan of Care (Signed)
  Problem: Metabolic: Goal: Ability to maintain appropriate glucose levels will improve Outcome: Progressing   Problem: Nutritional: Goal: Maintenance of adequate nutrition will improve Outcome: Progressing   Problem: Skin Integrity: Goal: Risk for impaired skin integrity will decrease Outcome: Progressing   Problem: Clinical Measurements: Goal: Ability to maintain clinical measurements within normal limits will improve Outcome: Progressing Goal: Will remain free from infection Outcome: Progressing Goal: Respiratory complications will improve Outcome: Progressing   Problem: Activity: Goal: Risk for activity intolerance will decrease Outcome: Progressing   Problem: Pain Management: Goal: General experience of comfort will improve Outcome: Progressing   Problem: Safety: Goal: Ability to remain free from injury will improve Outcome: Progressing   Problem: Skin Integrity: Goal: Risk for impaired skin integrity will decrease Outcome: Progressing

## 2023-09-15 NOTE — ED Provider Notes (Signed)
[name] EMERGENCY DEPARTMENT AT [facility] ([facility]) Provider Note   CSN: [id number] Arrival date & time: 09/15/23  4098     History {Add pertinent medical, surgical, social history, OB history to HPI:1} Chief Complaint  Patient presents with   Cellulitis    [name] is a 77 y.o. male.  77 year old male with a history of PAD status post stenting, diabetes, and left great toe amputation who presents to the emergency department with left second toe pain, drainage, and swelling of the left lower extremity.  Patient reports that in the past few days started developing pain of his left second toe.  Today noticed some drainage from it as well.  Says he has redness streaking up his foot and leg.  Currently 8/10 in severity.  No fevers.  On aspirin and Plavix but no other blood thinners.       Home Medications Prior to Admission medications   Medication Sig Start Date End Date Taking? Authorizing Provider  albuterol (PROVENTIL) (2.5 MG/3ML) 0.083% nebulizer solution Take 3 mLs (2.5 mg total) by nebulization every 6 (six) hours as needed for wheezing or shortness of breath. 01/28/23   Bradd Canary, MD  ALPRAZolam Prudy Feeler) 0.5 MG tablet Take 1 tablet (0.5 mg total) by mouth 2 (two) times daily as needed for anxiety. 07/30/23   Bradd Canary, MD  arformoterol (BROVANA) 15 MCG/2ML NEBU Take 2 mLs (15 mcg total) by nebulization daily. 02/26/23   Charlott Holler, MD  Ascorbic Acid (VITAMIN C) 1000 MG tablet Take 1,000 mg by mouth daily.    [provider]  aspirin EC 81 MG tablet Take 81 mg by mouth at bedtime.    [provider]  atorvastatin (LIPITOR) 80 MG tablet Take 1 tablet (80 mg total) by mouth daily. 06/21/23   Bradd Canary, MD  BD PEN NEEDLE NANO 2ND GEN 32G X 4 MM MISC USE AS DIRECTED WITH HUMALOG PEN 09/11/21   [provider]  Blood Glucose Monitoring Suppl (ACCU-CHEK AVIVA PLUS) w/Device KIT Check blood sugars three times daily 11/16/22   Bradd Canary, MD  budesonide (PULMICORT) 0.25 MG/2ML nebulizer solution Take 2 mLs (0.25 mg total) by nebulization daily. 03/19/23   Charlott Holler, MD  carvedilol (COREG) 25 MG tablet TAKE ONE-HALF TABLET BY  MOUTH TWICE DAILY WITH  MEALS 04/02/23   Bradd Canary, MD  cholecalciferol (VITAMIN D) 1000 UNITS tablet Take 1,000 Units by mouth daily.    [provider]  clopidogrel (PLAVIX) 75 MG tablet TAKE 1 TABLET BY MOUTH EVERY DAY WITH BREAKFAST 05/20/23   Bradd Canary, MD  ezetimibe (ZETIA) 10 MG tablet Take 1 tablet (10 mg total) by mouth daily. 12/19/22   Bradd Canary, MD  FLUoxetine (PROZAC) 40 MG capsule Take 1 capsule (40 mg total) by mouth daily. 06/21/23   Bradd Canary, MD  glimepiride (AMARYL) 2 MG tablet Take 1 tablet (2 mg total) by mouth 2 (two) times daily. 08/26/23   Bradd Canary, MD  glucose blood (ACCU-CHEK AVIVA PLUS) test strip Check blood sugars three times daily 11/16/22   Bradd Canary, MD  insulin lispro (HUMALOG KWIKPEN) 100 UNIT/ML KwikPen Before each meal 3 times a day, 140-199 - 3 units, 200-250 - 6 units, 251-299 - 8 units,  300-349 - 10 units,  350 or above 12 units. Insulin PEN if approved, provide syringes and needles if needed. 05/15/22   Bradd Canary, MD  Lancets (  ACCU-CHEK MULTICLIX) lancets Check blood sugars three times daily 11/16/22   Bradd Canary, MD  Latanoprost 0.005 % EMUL Place 1 drop into both eyes in the morning and at bedtime.    [provider]  levothyroxine (SYNTHROID) 25 MCG tablet TAKE 1 TABLET BY MOUTH EVERY DAY 05/20/23   Bradd Canary, MD  losartan (COZAAR) 50 MG tablet TAKE 1 TABLET BY MOUTH EVERY DAY 09/09/23   Bradd Canary, MD  Magnesium 500 MG TABS Take 500 mg by mouth daily.    [provider]  memantine (NAMENDA) 5 MG tablet Take 1 tablet (5 mg total) by mouth 2 (two) times daily. 09/13/23   Marcos Eke, PA-C  Multiple Vitamin (MULTIVITAMIN) tablet Take 1 tablet by mouth daily.    [provider]  Omega-3 Fatty Acids (FISH OIL) 1000 MG CAPS Take 1,000 mg by mouth 2 (two) times daily.    [provider]  pioglitazone (ACTOS) 30 MG tablet TAKE 1 TABLET BY MOUTH EVERY DAY 08/26/23   Bradd Canary, MD  pregabalin (LYRICA) 150 MG capsule TAKE 1 CAPSULE BY MOUTH TWICE A DAY 08/12/23   Bradd Canary, MD  traMADol (ULTRAM) 50 MG tablet Take 1 tablet (50 mg total) by mouth every 8 (eight) hours as needed. 09/09/23   Bradd Canary, MD  vitamin B-12 (CYANOCOBALAMIN) 1000 MCG tablet Take 1,000 mcg by mouth See admin instructions. Take one tablet by mouth twice weekly on Monday and Thursday per wife    [provider]  zinc gluconate 50 MG tablet Take 50 mg by mouth daily.    [provider]      Allergies    Metformin and Prednisone    Review of Systems   Review of Systems  Physical Exam Updated Vital Signs BP (!) 119/49   Pulse 76   Temp 98.3 F (36.8 C) (Oral)   Resp 16   Ht 5\' 9"  (1.753 m)   Wt 120.2 kg   SpO2 91%   BMI 39.13 kg/m  Physical Exam Constitutional:      Appearance: Normal appearance.  HENT:     Head: Normocephalic and atraumatic.     Right Ear: External ear normal.     Left Ear: External ear normal.  Musculoskeletal:     Right lower leg: No edema.     Left lower leg: Edema present.     Comments: Left DP pulse dopplerable (monophasic).  Left great toe with purulent drainage from the tip of the toe.  Appears necrotic at the tip as well.  Erythema streaking up the toe and foot and leg.  See image below.  No subcutaneous emphysema palpated.  Neurological:     Mental Status: He is alert.     ED Results / Procedures / Treatments   Labs (all labs ordered are listed, but only abnormal results are displayed) Labs Reviewed  COMPREHENSIVE METABOLIC PANEL - Abnormal; Notable for the following components:      Result Value   Glucose, Bld 142 (*)    Albumin 3.2 (*)    All other components within normal limits  CBC WITH  DIFFERENTIAL/PLATELET - Abnormal; Notable for the following components:   WBC 11.8 (*)    Neutro Abs 9.0 (*)    All other components within normal limits  CULTURE, BLOOD (ROUTINE X 2)  CULTURE, BLOOD (ROUTINE X 2)  URINALYSIS, W/ REFLEX TO CULTURE (INFECTION SUSPECTED)  SEDIMENTATION RATE  C-REACTIVE PROTEIN  I-STAT CG4  LACTIC ACID, ED  I-STAT CG4 LACTIC ACID, ED    EKG None  Radiology No results found.  Procedures Procedures  {Document cardiac monitor, telemetry assessment procedure when appropriate:1}  Medications Ordered in ED Medications  vancomycin (VANCOREADY) IVPB 2000 mg/400 mL (has no administration in time range)  cefTRIAXone (ROCEPHIN) 2 g in sodium chloride 0.9 % 100 mL IVPB (has no administration in time range)    ED Course/ Medical Decision Making/ A&P Clinical Course as of 09/15/23 1139  Sun Sep 15, 2023  1128 Dr Denman George from podiatry consulted.  Dr. Bobbe Medico recommends getting ABIs for the patient and reaching out to vascular surgery after the ABIs.  Also recommends MRI of the left foot.  They will see the patient when he is admitted and agree that he may need amputation. [RP]    Clinical Course User Index [RP] Rondel Baton, MD   {   Click here for ABCD2, HEART and other calculatorsREFRESH Note before signing :1}                              Medical Decision Making Amount and/or Complexity of Data Reviewed Labs: ordered. Radiology: ordered.  Risk Prescription drug management. Decision regarding hospitalization.   ***  {Document critical care time when appropriate:1} {Document review of labs and clinical decision tools ie heart score, Chads2Vasc2 etc:1}  {Document your independent review of radiology images, and any outside records:1} {Document your discussion with family members, caretakers, and with consultants:1} {Document social determinants of health affecting pt's care:1} {Document your decision making why or why not admission,  treatments were needed:1} Final Clinical Impression(s) / ED Diagnoses Final diagnoses:  None    Rx / DC Orders ED Discharge Orders     None

## 2023-09-16 DIAGNOSIS — I739 Peripheral vascular disease, unspecified: Secondary | ICD-10-CM | POA: Diagnosis not present

## 2023-09-16 DIAGNOSIS — J9611 Chronic respiratory failure with hypoxia: Secondary | ICD-10-CM | POA: Diagnosis not present

## 2023-09-16 DIAGNOSIS — E11628 Type 2 diabetes mellitus with other skin complications: Secondary | ICD-10-CM

## 2023-09-16 DIAGNOSIS — L089 Local infection of the skin and subcutaneous tissue, unspecified: Secondary | ICD-10-CM

## 2023-09-16 DIAGNOSIS — L03116 Cellulitis of left lower limb: Secondary | ICD-10-CM | POA: Diagnosis not present

## 2023-09-16 DIAGNOSIS — D72829 Elevated white blood cell count, unspecified: Secondary | ICD-10-CM | POA: Diagnosis not present

## 2023-09-16 DIAGNOSIS — L97524 Non-pressure chronic ulcer of other part of left foot with necrosis of bone: Secondary | ICD-10-CM

## 2023-09-16 DIAGNOSIS — E11621 Type 2 diabetes mellitus with foot ulcer: Secondary | ICD-10-CM | POA: Diagnosis not present

## 2023-09-16 LAB — BASIC METABOLIC PANEL
Anion gap: 11 (ref 5–15)
BUN: 15 mg/dL (ref 8–23)
CO2: 26 mmol/L (ref 22–32)
Calcium: 9.2 mg/dL (ref 8.9–10.3)
Chloride: 103 mmol/L (ref 98–111)
Creatinine, Ser: 0.82 mg/dL (ref 0.61–1.24)
GFR, Estimated: >60 mL/min (ref >60)
Glucose, Bld: 165 mg/dL — ABNORMAL HIGH (ref 70–99)
Potassium: 4.7 mmol/L (ref 3.5–5.1)
Sodium: 140 mmol/L (ref 135–145)

## 2023-09-16 LAB — GLUCOSE, CAPILLARY
Glucose-Capillary: 194 mg/dL — ABNORMAL HIGH (ref 70–99)
Glucose-Capillary: 133 mg/dL — ABNORMAL HIGH (ref 70–99)
Glucose-Capillary: 149 mg/dL — ABNORMAL HIGH (ref 70–99)
Glucose-Capillary: 127 mg/dL — ABNORMAL HIGH (ref 70–99)

## 2023-09-16 LAB — CBC
HCT: 43 % (ref 39.0–52.0)
Hemoglobin: 13.7 g/dL (ref 13.0–17.0)
MCH: 31.7 pg (ref 26.0–34.0)
MCHC: 31.9 g/dL (ref 30.0–36.0)
MCV: 99.5 fL (ref 80.0–100.0)
Platelets: 222 10*3/uL (ref 150–400)
RBC: 4.32 MIL/uL (ref 4.22–5.81)
RDW: 13.1 % (ref 11.5–15.5)
WBC: 9.7 10*3/uL (ref 4.0–10.5)
nRBC: 0 % (ref 0.0–0.2)

## 2023-09-16 MED ORDER — METRONIDAZOLE 500 MG/100ML IV SOLN: 500.0000 mg | Freq: Two times a day (BID) | INTRAVENOUS | Status: DC

## 2023-09-16 MED ORDER — PIPERACILLIN-TAZOBACTAM 3.375 G IVPB
3.3750 g | Freq: Three times a day (TID) | INTRAVENOUS | Status: DC
  Administered 2023-09-16 – 2023-09-20 (×11): 3.375 g via INTRAVENOUS
  Filled 2023-09-16 (×13): qty 50

## 2023-09-16 NOTE — Assessment & Plan Note (Signed)
Continue Namenda

## 2023-09-16 NOTE — Assessment & Plan Note (Signed)
History of COPD.   Stable on baseline oxygen requirement of 4 L. -Continue supplemental oxygen -Continue with home bronchodilators

## 2023-09-16 NOTE — Assessment & Plan Note (Signed)
Diabetic foot ulcer. MRI concerning for osteomyelitis, abnormal [name] and weak pulses. Vascular surgery was consulted and patient would likely get aortogram and intervention, history of 2 prior iliac stents. Podiatry would like to have him vascular procedure before deciding about amputation -Continue with broad-spectrum antibiotics

## 2023-09-16 NOTE — Assessment & Plan Note (Signed)
Continue home Prozac and Xanax as needed.

## 2023-09-16 NOTE — Assessment & Plan Note (Signed)
Continue atorvastatin and Zetia. 

## 2023-09-16 NOTE — Plan of Care (Signed)
  Problem: Coping: Goal: Ability to adjust to condition or change in health will improve Outcome: Progressing   Problem: Metabolic: Goal: Ability to maintain appropriate glucose levels will improve Outcome: Progressing   Problem: Nutritional: Goal: Maintenance of adequate nutrition will improve Outcome: Progressing   Problem: Skin Integrity: Goal: Risk for impaired skin integrity will decrease Outcome: Progressing   Problem: Education: Goal: Knowledge of General Education information will improve Description: Including pain rating scale, medication(s)/side effects and non-pharmacologic comfort measures Outcome: Progressing   Problem: Clinical Measurements: Goal: Will remain free from infection Outcome: Progressing

## 2023-09-16 NOTE — Progress Notes (Signed)
Pharmacy Antibiotic Note  [name] is a 77 y.o. male admitted on 09/15/2023 with  diabetic foot infection .  Pharmacy has been consulted for Zosyn dosing. Discussed with Dr. Lilian Kapur, Podiatry - want both anaerobic and pseudomonas coverage so changing to Zosyn therapy.   Plan: Zosyn 3.375g IV q8h (4 hour infusion). Continue Vancomycin as previously ordered  Height: 5\' 9"  (175.3 cm) Weight: 120.2 kg (265 lb) IBW/kg (Calculated) : 70.7  Temp (24hrs), Avg:98.2 F (36.8 C), Min:97.8 F (36.6 C), Max:98.5 F (36.9 C)  Recent Labs  Lab 09/15/23 1007 09/15/23 1017 09/16/23 0732  WBC 11.8*  --  9.7  CREATININE 0.89  --  0.82  LATICACIDVEN  --  1.3  --     Estimated Creatinine Clearance: 98.1 mL/min (by C-G formula based on SCr of 0.82 mg/dL).    Allergies  Allergen Reactions   Metformin Diarrhea   Prednisone Itching    Antimicrobials this admission: Ceftriaxone 11/10 x1 dose Vancomycin 11/10 x1 dose Zosyn 11/11 >>  Dose adjustments this admission:   Microbiology results: 11/10 BCx: ngtd <24 hrs   Thank you for allowing pharmacy to be a part of this patient's care.  Link Snuffer, PharmD, BCPS, BCCCP Please refer to [facility] for [facility] numbers 09/16/2023 9:02 AM

## 2023-09-16 NOTE — Assessment & Plan Note (Signed)
>>  ASSESSMENT AND PLAN FOR MEMORY LOSS WRITTEN ON 09/16/2023  5:00 PM BY AMIN, SUMAYYA, MD  -Continue Namenda

## 2023-09-16 NOTE — Assessment & Plan Note (Signed)
Continue Synthroid °

## 2023-09-16 NOTE — Assessment & Plan Note (Signed)
-  Continue home Coreg and losartan
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
## 2023-09-16 NOTE — Assessment & Plan Note (Signed)
-   Continue outpatient follow-up ?

## 2023-09-16 NOTE — Assessment & Plan Note (Signed)
Repeat [name] with little worsening. History of iliac stent back in 2020, vascular surgery was consulted and patient will likely get aortogram followed by intervention as needed -Continue aspirin, Plavix and statin

## 2023-09-16 NOTE — Assessment & Plan Note (Signed)
>>  ASSESSMENT AND PLAN FOR OBESITY (BMI 30-39.9) WRITTEN ON 09/16/2023  5:01 PM BY AMIN, SUMAYYA, MD  Estimated body mass index is 39.13 kg/m as calculated from the following:   Height as of this encounter: 5' 9 (1.753 m).   Weight as of this encounter: 120.2 kg.   -Encouraged weight loss

## 2023-09-16 NOTE — Assessment & Plan Note (Signed)
Improved. Continue to monitor. 
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
## 2023-09-16 NOTE — Hospital Course (Addendum)
Taken from H&P.   [name] is a 77 y.o. male with medical history significant of hypertension, hyperlipidemia, carotid artery disease,  COPD on 4 L of oxygen at baseline, peripheral artery disease s/p left iliac stents, osteomyelitis s/p left hallux amputation 05/2019 ,hypothyroidism, AAA,who presents with complaints of a pain and swelling of his left second toe.  Patient has been following in outpatient setting with Dr. Loreta Ave of podiatry and was last seen on 10/15.  Over the past few days he was having worsening pain, redness and drainage. Patient is on aspirin and Plavix.  On presentation with stable vitals, labs with WBC of 11.8, CRP 6.9, ESR 36, lactic acid 1.9 X-rays of the left foot noted chronic findings but no definitive bone abnormality.  Case have been discussed with Dr. Verna Czech of podiatry and they placed orders for Doppler ultrasound of the lower extremity as well as [name]/TBI.  Blood cultures were obtained.  Patient was started on empiric antibiotics of vancomycin and Rocephin.  Doppler ultrasound of the lower extremities that showed no signs of a DVT, but did note enlarged lymph nodes.   11/11: Vital stable, decreased pulses and abnormal [name].  Concern of osteomyelitis by podiatry so MRI was ordered, and it shows third toe cellulitis with osteomyelitis of the third middle and distal phalanxes.  No abscess.  Vascular surgery was consulted as patient will likely require amputation.  Flagyl was also added to ceftriaxone and vancomycin to broaden up coverage.  11/12: Vital stable, LE angiography is scheduled for Wednesday, likely followed by a TMA.  Patient will be high risk for BKA versus AKA if he fails TMA. Mildly elevated CBG-adding Semglee 10 units daily

## 2023-09-16 NOTE — Progress Notes (Signed)
Progress Note   Patient: [name] ZOX:[id number] DOB: 1946-04-03 DOA: 09/15/2023     1 DOS: the patient was seen and examined on 09/16/2023   Brief hospital course: Taken from H&P.   [name] is a 77 y.o. male with medical history significant of hypertension, hyperlipidemia, carotid artery disease,  COPD on 4 L of oxygen at baseline, peripheral artery disease s/p left iliac stents, osteomyelitis s/p left hallux amputation 05/2019 ,hypothyroidism, AAA,who presents with complaints of a pain and swelling of his left second toe.  Patient has been following in outpatient setting with Dr. Loreta Ave of podiatry and was last seen on 10/15.  Over the past few days he was having worsening pain, redness and drainage. Patient is on aspirin and Plavix.  On presentation with stable vitals, labs with WBC of 11.8, CRP 6.9, ESR 36, lactic acid 1.9 X-rays of the left foot noted chronic findings but no definitive bone abnormality.  Case have been discussed with Dr. Verna Czech of podiatry and they placed orders for Doppler ultrasound of the lower extremity as well as [name]/TBI.  Blood cultures were obtained.  Patient was started on empiric antibiotics of vancomycin and Rocephin.  Doppler ultrasound of the lower extremities that showed no signs of a DVT, but did note enlarged lymph nodes.   11/11: Vital stable, decreased pulses and abnormal [name].  Concern of osteomyelitis by podiatry so MRI was ordered, and it shows third toe cellulitis with osteomyelitis of the third middle and distal phalanxes.  No abscess.  Vascular surgery was consulted as patient will likely require amputation.  Flagyl was also added to ceftriaxone and vancomycin to broaden up coverage.   Assessment and Plan: * Cellulitis of left foot Diabetic foot ulcer. MRI concerning for osteomyelitis, abnormal [name] and weak pulses. Vascular surgery was consulted and patient would likely get aortogram and intervention, history of 2 prior iliac  stents. Podiatry would like to have him vascular procedure before deciding about amputation -Continue with broad-spectrum antibiotics  Leukocytosis Improved. -Continue to monitor  Chronic respiratory failure with hypoxia (HCC) History of COPD.   Stable on baseline oxygen requirement of 4 L. -Continue supplemental oxygen -Continue with home bronchodilators   Diabetes mellitus type 2 with neurological manifestations A1c of 6.6. -Holding home Actos and Amaryl -Continue SSI -Continue Lyrica  PVD (peripheral vascular disease) (HCC) Repeat [name] with little worsening. History of iliac stent back in 2020, vascular surgery was consulted and patient will likely get aortogram followed by intervention as needed -Continue aspirin, Plavix and statin  Hypertension -Continue home Coreg and losartan  Generalized anxiety disorder -Continue home Prozac and Xanax as needed  Memory loss -Continue Namenda  Hypothyroidism -Continue Synthroid  Hyperlipidemia -Continue atorvastatin and Zetia  Obesity (BMI 30-39.9) Estimated body mass index is 39.13 kg/m as calculated from the following:   Height as of this encounter: 5\' 9"  (1.753 m).   Weight as of this encounter: 120.2 kg.   -Encouraged weight loss  AAA (abdominal aortic aneurysm) without rupture -Continue outpatient follow-up      Subjective: Patient was seen and examined today.  Denies any pain.  Physical Exam: Vitals:   09/15/23 2113 09/16/23 0512 09/16/23 0744 09/16/23 1536  BP: (!) 116/48 (!) 165/69 (!) 141/60 115/67  Pulse: 65 79 78 (!) 57  Resp: 18 17 16 16   Temp: 98.2 F (36.8 C) 98.5 F (36.9 C) 97.8 F (36.6 C) 98.1 F (36.7 C)  TempSrc: Oral Oral Oral Oral  SpO2: 99% 96% 94% 96%  Weight:      Height:       General.  Obese gentleman, in no acute distress. Pulmonary.  Lungs clear bilaterally, normal respiratory effort. CV.  Regular rate and rhythm, no JVD, rub or murmur. Abdomen.  Soft, nontender,  nondistended, BS positive. CNS.  Alert and oriented .  No focal neurologic deficit. Extremities.  Amputated left great toe, small ulcer at the plantar surface and on third toe with surrounding erythema, edema and hyperthermia, pulses weak Psychiatry.  Judgment and insight appears normal.   Data Reviewed: Prior data reviewed  Family Communication: Discussed with patient  Disposition: Status is: Inpatient Remains inpatient appropriate because: Severity of illness  Planned Discharge Destination:  To be determined  Time spent: 50 minutes  This record has been created using Conservation officer, historic buildings. Errors have been sought and corrected,but may not always be located. Such creation errors do not reflect on the standard of care.   Author: Arnetha Courser, MD 09/16/2023 5:01 PM  For on call review [email or web address].

## 2023-09-16 NOTE — TOC Initial Note (Signed)
Transition of Care (TOC) - Initial/Assessment Note   Spoke to patient and wife at bedside.   Patient has home oxygen with [employer] , and has portable concentrator   They have rollator and cane at home that patient does not use .  Wife uses rolling walker when out in the community    [facility] will continue to follow for medical plan and discharge needs  Patient Details  Name: [name] MRN: [id number] Date of Birth: August 11, 1946  Transition of Care [facility]) CM/SW Contact:    Kingsley Plan, RN Phone Number: 09/16/2023, 1:44 PM  Clinical Narrative:                   Expected Discharge Plan:  (Await plan and PT eval) Barriers to Discharge: Continued Medical Work up   Patient Goals and CMS Choice Patient states their goals for this hospitalization and ongoing recovery are:: to return to home          Expected Discharge Plan and Services   Discharge Planning Services: CM Consult Post Acute Care Choice:  (await plan) Living arrangements for the past 2 months: Single Family Home                 DME Arranged:  (await plan)                    Prior Living Arrangements/Services Living arrangements for the past 2 months: Single Family Home Lives with:: Spouse Patient language and need for interpreter reviewed:: Yes Do you feel safe going back to the place where you live?: Yes      Need for Family Participation in Patient Care: Yes (Comment) Care giver support system in place?: Yes (comment) Current home services: DME Criminal Activity/Legal Involvement Pertinent to Current Situation/Hospitalization: No - Comment as needed  Activities of Daily Living   ADL Screening (condition at time of admission) Independently performs ADLs?: Yes (appropriate for developmental age) Is the patient deaf or have difficulty hearing?: Yes Does the patient have difficulty seeing, even when wearing glasses/contacts?: No Does the patient have difficulty concentrating, remembering,  or making decisions?: No  Permission Sought/Granted   Permission granted to share information with : Yes, Verbal Permission Granted  Share Information with NAME: wife [name]           Emotional Assessment Appearance:: Appears stated age Attitude/Demeanor/Rapport: Engaged Affect (typically observed): Accepting Orientation: : Oriented to Self, Oriented to Place, Oriented to  Time, Oriented to Situation Alcohol / Substance Use: Not Applicable Psych Involvement: No (comment)  Admission diagnosis:  Diabetic foot infection (HCC) [O13.086, L08.9] Patient Active Problem List   Diagnosis Date Noted   Type 2 diabetes mellitus with diabetic foot infection (HCC) 09/16/2023   Cellulitis of left foot 09/15/2023   Leukocytosis 09/15/2023   Chronic respiratory failure with hypoxia (HCC) 09/15/2023   PVD (peripheral vascular disease) (HCC) 09/15/2023   Memory loss 09/15/2023   Obesity (BMI 30-39.9) 09/15/2023   Generalized anxiety disorder 06/05/2022   Osteoarthritis 06/05/2022   Carotid artery disease 05/04/2022   Dyspnea 03/06/2022   Mild obstructive sleep apnea 03/06/2022   Hypoxemia 09/07/2021   Prolonged QT interval 09/07/2021   Hypothyroidism 05/20/2021   Neck pain 12/27/2020   Hyperlipidemia associated with type 2 diabetes mellitus 06/13/2020   Diabetic foot ulcer 05/30/2020   Right otitis media 05/30/2020   History of COVID-19 12/04/2019   Class 1 obesity with serious comorbidity and body mass index (BMI) of 32.0 to 32.9 in  adult 12/16/2018   Renal artery stenosis 10/19/2018   Iliac artery stenosis, left 10/19/2018   Anxiety and depression 05/25/2018   Right hip pain 11/21/2017   Colon cancer screening 11/21/2017   Synovial cyst of lumbar facet joint 04/29/2017   Back pain 03/17/2017   Gout 12/06/2016   Allergic rhinitis 04/23/2016   Diabetic peripheral vascular disease 09/06/2015   COPD (chronic obstructive pulmonary disease) 02/17/2015   Hereditary and idiopathic  peripheral neuropathy 10/17/2014   Aneurysm, cerebral, nonruptured 03/02/2014   Renal insufficiency 08/14/2013   Anemia 08/14/2013   AAA (abdominal aortic aneurysm) without rupture 04/27/2010   Knee pain, left 12/12/2009   GERD 11/16/2009   Hyperlipidemia 10/17/2007   Hypertension 10/17/2007   Coronary atherosclerosis 10/17/2007   Glaucoma 10/17/2007   Diabetes mellitus type 2 with neurological manifestations 04/21/2007   Stroke 05/09/2005   PCP:  Bradd Canary, MD Pharmacy:   [facility] [phone] [place], [place] - [address] [place] [address] Phone: [phone] Fax: [phone]  [facility], [place] - [address] [place] [address] Phone: [phone] Fax: [phone]  [facility] [place], [place] - [address] [place] [address] Phone: [phone] Fax: [phone]     Social Determinants of Health (SDOH) Social History: SDOH Screenings   Food Insecurity: Food Insecurity Present (09/15/2023)  Housing: Low Risk  (08/27/2023)  Transportation Needs: No Transportation Needs (09/15/2023)  Utilities: Not At Risk (08/27/2023)  Alcohol Screen: Low Risk  (04/30/2023)  Depression (PHQ2-9): Low Risk  (09/03/2023)  Financial Resource Strain: Medium Risk (08/27/2023)  Physical Activity: Inactive (08/27/2023)  Social Connections: Moderately Isolated (08/27/2023)  Stress: No Stress Concern Present (08/27/2023)  Tobacco Use: Medium Risk (09/15/2023)  Health Literacy: Adequate Health Literacy (09/03/2023)   SDOH Interventions:     Readmission Risk Interventions     No data to display

## 2023-09-16 NOTE — Assessment & Plan Note (Signed)
A1c of 6.6. -Holding home Actos and Amaryl -Continue SSI -Continue Lyrica

## 2023-09-16 NOTE — Assessment & Plan Note (Signed)
Estimated body mass index is 39.13 kg/m as calculated from the following:   Height as of this encounter: 5\' 9"  (1.753 m).   Weight as of this encounter: 120.2 kg.   -Encouraged weight loss

## 2023-09-17 DIAGNOSIS — D72829 Elevated white blood cell count, unspecified: Secondary | ICD-10-CM | POA: Diagnosis not present

## 2023-09-17 DIAGNOSIS — J9611 Chronic respiratory failure with hypoxia: Secondary | ICD-10-CM | POA: Diagnosis not present

## 2023-09-17 DIAGNOSIS — E11621 Type 2 diabetes mellitus with foot ulcer: Secondary | ICD-10-CM | POA: Diagnosis not present

## 2023-09-17 DIAGNOSIS — L03116 Cellulitis of left lower limb: Secondary | ICD-10-CM | POA: Diagnosis not present

## 2023-09-17 DIAGNOSIS — M869 Osteomyelitis, unspecified: Secondary | ICD-10-CM

## 2023-09-17 DIAGNOSIS — M86172 Other acute osteomyelitis, left ankle and foot: Secondary | ICD-10-CM | POA: Diagnosis not present

## 2023-09-17 LAB — GLUCOSE, CAPILLARY
Glucose-Capillary: 194 mg/dL — ABNORMAL HIGH (ref 70–99)
Glucose-Capillary: 137 mg/dL — ABNORMAL HIGH (ref 70–99)
Glucose-Capillary: 192 mg/dL — ABNORMAL HIGH (ref 70–99)
Glucose-Capillary: 176 mg/dL — ABNORMAL HIGH (ref 70–99)

## 2023-09-17 MED ORDER — INSULIN GLARGINE-YFGN 100 UNIT/ML ~~LOC~~ SOLN
10.0000 [IU] | Freq: Every day | SUBCUTANEOUS | Status: DC
  Administered 2023-09-17 – 2023-09-20 (×3): 10 [IU] via SUBCUTANEOUS
  Filled 2023-09-17 (×4): qty 0.1

## 2023-09-17 NOTE — Progress Notes (Addendum)
  Subjective:  Patient ID: [name], male    DOB: 06-Jun-1946,  MRN: [id number]  Patient seen at bedside resting comfortably not having any pain his wife is present.   Negative for chest pain and shortness of breath Fever: no Night sweats: no Constitutional signs: no Objective:   Vitals:   09/17/23 0537 09/17/23 0700  BP: (!) 159/74 117/78  Pulse: (!) 58 70  Resp: 18 16  Temp: 97.7 F (36.5 C) 98.3 F (36.8 C)  SpO2: 97% 95%   General AA&O x3. Normal mood and affect.  Vascular Left foot nonpalpable pulses, mild edema  Neurologic Epicritic sensation grossly absent.  Dermatologic Cellulitis is improving there is purulent drainage from the toe  Orthopedic: MMT 5/5 in dorsiflexion, plantarflexion, inversion, and eversion. Normal joint ROM without pain or crepitus.     Assessment & Plan:  Patient was evaluated and treated and all questions answered.  PAD, osteomyelitis left third toe -Going to angiography tomorrow with vascular surgery.  Will determine level of amputation.  At minimum will need partial toe amputation -Weightbearing as tolerated -Dressing changed today change daily with Betadine -Plan for surgery with me Thursday or Friday pending results of angiography for toe amputation -Continue Vanco and Zosyn for broad-spectrum coverage  Edwin Cap, DPM  Accessible via secure chat for questions or concerns.

## 2023-09-17 NOTE — Assessment & Plan Note (Addendum)
Diabetic foot ulcer. MRI concerning for osteomyelitis, abnormal [name] and weak pulses. Vascular surgery was consulted and patient would likely get aortogram and intervention, history of 2 prior iliac stents. Podiatry would like to have him vascular procedure before deciding about amputation -Continue with broad-spectrum antibiotics -Lower extremity angiography is scheduled for Wednesday, likely be followed by toe versus TMA on Thursday or Friday by podiatry

## 2023-09-17 NOTE — Plan of Care (Signed)
  Problem: Fluid Volume: Goal: Ability to maintain a balanced intake and output will improve Outcome: Progressing   Problem: Health Behavior/Discharge Planning: Goal: Ability to manage health-related needs will improve Outcome: Progressing   Problem: Nutritional: Goal: Maintenance of adequate nutrition will improve Outcome: Progressing

## 2023-09-17 NOTE — Assessment & Plan Note (Signed)
A1c of 6.6.  CBG mildly elevated -Holding home Actos and Amaryl -Continue SSI -Adding Semglee 10 units daily -Continue Lyrica

## 2023-09-17 NOTE — Plan of Care (Signed)
  Problem: Pain Management: Goal: General experience of comfort will improve Outcome: Progressing   Problem: Safety: Goal: Ability to remain free from injury will improve Outcome: Progressing   Problem: Skin Integrity: Goal: Risk for impaired skin integrity will decrease Outcome: Progressing

## 2023-09-17 NOTE — Progress Notes (Signed)
Progress Note   Patient: [name] ZHY:[id number] DOB: [birth year] DOA: 09/15/2023     2 DOS: the patient was seen and examined on 09/17/2023   Brief hospital course: Taken from H&P.   [name] is a 77 y.o. male with medical history significant of hypertension, hyperlipidemia, carotid artery disease,  COPD on 4 L of oxygen at baseline, peripheral artery disease s/p left iliac stents, osteomyelitis s/p left hallux amputation 05/2019 ,hypothyroidism, AAA,who presents with complaints of a pain and swelling of his left second toe.  Patient has been following in outpatient setting with Dr. Loreta Ave of podiatry and was last seen on 10/15.  Over the past few days he was having worsening pain, redness and drainage. Patient is on aspirin and Plavix.  On presentation with stable vitals, labs with WBC of 11.8, CRP 6.9, ESR 36, lactic acid 1.9 X-rays of the left foot noted chronic findings but no definitive bone abnormality.  Case have been discussed with Dr. Verna Czech of podiatry and they placed orders for Doppler ultrasound of the lower extremity as well as [name]/TBI.  Blood cultures were obtained.  Patient was started on empiric antibiotics of vancomycin and Rocephin.  Doppler ultrasound of the lower extremities that showed no signs of a DVT, but did note enlarged lymph nodes.   11/11: Vital stable, decreased pulses and abnormal [name].  Concern of osteomyelitis by podiatry so MRI was ordered, and it shows third toe cellulitis with osteomyelitis of the third middle and distal phalanxes.  No abscess.  Vascular surgery was consulted as patient will likely require amputation.  Flagyl was also added to ceftriaxone and vancomycin to broaden up coverage.  11/12: Vital stable, LE angiography is scheduled for Wednesday, likely followed by a TMA.  Patient will be high risk for BKA versus AKA if he fails TMA. Mildly elevated CBG-adding Semglee 10 units daily   Assessment and Plan: * Cellulitis of left  foot Diabetic foot ulcer. MRI concerning for osteomyelitis, abnormal [name] and weak pulses. Vascular surgery was consulted and patient would likely get aortogram and intervention, history of 2 prior iliac stents. Podiatry would like to have him vascular procedure before deciding about amputation -Continue with broad-spectrum antibiotics -Lower extremity angiography is scheduled for Wednesday, likely be followed by toe versus TMA on Thursday or Friday by podiatry  Leukocytosis Improved. -Continue to monitor  Chronic respiratory failure with hypoxia (HCC) History of COPD.   Stable on baseline oxygen requirement of 4 L. -Continue supplemental oxygen -Continue with home bronchodilators   Diabetes mellitus type 2 with neurological manifestations A1c of 6.6.  CBG mildly elevated -Holding home Actos and Amaryl -Continue SSI -Adding Semglee 10 units daily -Continue Lyrica  PVD (peripheral vascular disease) (HCC) Repeat [name] with little worsening. History of iliac stent back in 2020, vascular surgery was consulted and patient will likely get aortogram followed by intervention as needed -Continue aspirin, Plavix and statin  Hypertension -Continue home Coreg and losartan  Generalized anxiety disorder -Continue home Prozac and Xanax as needed  Memory loss -Continue Namenda  Hypothyroidism -Continue Synthroid  Hyperlipidemia -Continue atorvastatin and Zetia  Obesity (BMI 30-39.9) Estimated body mass index is 39.13 kg/m as calculated from the following:   Height as of this encounter: 5\' 9"  (1.753 m).   Weight as of this encounter: 120.2 kg.   -Encouraged weight loss  AAA (abdominal aortic aneurysm) without rupture -Continue outpatient follow-up      Subjective: Patient was seen and examined today.  No new concern.  Denies any pain.  Physical Exam: Vitals:   09/17/23 0537 09/17/23 0700 09/17/23 0808 09/17/23 0809  BP: (!) 159/74 117/78 (!) 134/57   Pulse: (!) 58 70 63    Resp: 18 16 18    Temp: 97.7 F (36.5 C) 98.3 F (36.8 C) 98 F (36.7 C)   TempSrc: Oral Oral    SpO2: 97% 95% 97% 97%  Weight:      Height:       General.  Obese gentleman, in no acute distress. Pulmonary.  Lungs clear bilaterally, normal respiratory effort. CV.  Regular rate and rhythm, no JVD, rub or murmur. Abdomen.  Soft, nontender, nondistended, BS positive. CNS.  Alert and oriented .  No focal neurologic deficit. Extremities.  No edema, no cyanosis, pulses intact and symmetrical.  Left foot with bandage Psychiatry.  Judgment and insight appears normal.    Data Reviewed: Prior data reviewed  Family Communication: Discussed with patient  Disposition: Status is: Inpatient Remains inpatient appropriate because: Severity of illness  Planned Discharge Destination:  To be determined  Time spent: 50 minutes  This record has been created using Conservation officer, historic buildings. Errors have been sought and corrected,but may not always be located. Such creation errors do not reflect on the standard of care.   Author: Arnetha Courser, MD 09/17/2023 1:42 PM  For on call review [email or web address].

## 2023-09-18 ENCOUNTER — Ambulatory Visit (HOSPITAL_COMMUNITY): Admission: RE | Admit: 2023-09-18 | Payer: Medicare HMO | Source: Ambulatory Visit | Admitting: Vascular Surgery

## 2023-09-18 ENCOUNTER — Encounter (HOSPITAL_COMMUNITY)
Admission: EM | Disposition: A | Discharge status: Home Health | Payer: Self-pay | Source: Home / Self Care | Attending: Internal Medicine

## 2023-09-18 DIAGNOSIS — L03116 Cellulitis of left lower limb: Secondary | ICD-10-CM | POA: Diagnosis not present

## 2023-09-18 DIAGNOSIS — I70222 Atherosclerosis of native arteries of extremities with rest pain, left leg: Secondary | ICD-10-CM | POA: Diagnosis not present

## 2023-09-18 HISTORY — PX: ABDOMINAL AORTOGRAM W/LOWER EXTREMITY: CATH118223

## 2023-09-18 LAB — CBC
HCT: 39.7 % (ref 39.0–52.0)
Hemoglobin: 12.7 g/dL — ABNORMAL LOW (ref 13.0–17.0)
MCH: 32.2 pg (ref 26.0–34.0)
MCHC: 32 g/dL (ref 30.0–36.0)
MCV: 100.5 fL — ABNORMAL HIGH (ref 80.0–100.0)
Platelets: 212 10*3/uL (ref 150–400)
RBC: 3.95 MIL/uL — ABNORMAL LOW (ref 4.22–5.81)
RDW: 12.7 % (ref 11.5–15.5)
WBC: 8.5 10*3/uL (ref 4.0–10.5)
nRBC: 0 % (ref 0.0–0.2)

## 2023-09-18 LAB — BASIC METABOLIC PANEL
Anion gap: 9 (ref 5–15)
BUN: 15 mg/dL (ref 8–23)
CO2: 30 mmol/L (ref 22–32)
Calcium: 9 mg/dL (ref 8.9–10.3)
Chloride: 100 mmol/L (ref 98–111)
Creatinine, Ser: 0.82 mg/dL (ref 0.61–1.24)
GFR, Estimated: >60 mL/min (ref >60)
Glucose, Bld: 139 mg/dL — ABNORMAL HIGH (ref 70–99)
Potassium: 4.3 mmol/L (ref 3.5–5.1)
Sodium: 139 mmol/L (ref 135–145)

## 2023-09-18 LAB — GLUCOSE, CAPILLARY
Glucose-Capillary: 143 mg/dL — ABNORMAL HIGH (ref 70–99)
Glucose-Capillary: 151 mg/dL — ABNORMAL HIGH (ref 70–99)
Glucose-Capillary: 123 mg/dL — ABNORMAL HIGH (ref 70–99)
Glucose-Capillary: 199 mg/dL — ABNORMAL HIGH (ref 70–99)

## 2023-09-18 SURGERY — ABDOMINAL AORTOGRAM W/LOWER EXTREMITY
Anesthesia: LOCAL

## 2023-09-18 MED ORDER — SODIUM CHLORIDE 0.9 % IV SOLN: INTRAVENOUS | Status: DC

## 2023-09-18 MED ORDER — LABETALOL HCL 5 MG/ML IV SOLN: 10.0000 mg | INTRAVENOUS | Status: DC | PRN

## 2023-09-18 MED ORDER — ACETAMINOPHEN 325 MG PO TABS
650.0000 mg | ORAL_TABLET | ORAL | Status: DC | PRN
Start: 2023-09-18 — End: 2023-09-19

## 2023-09-18 MED ORDER — HEPARIN SODIUM (PORCINE) 1000 UNIT/ML IJ SOLN
INTRAMUSCULAR | Status: DC | PRN
  Administered 2023-09-18: 10000 [IU] via INTRAVENOUS

## 2023-09-18 MED ORDER — SODIUM CHLORIDE 0.9 % IV SOLN: 250.0000 mL | INTRAVENOUS | Status: AC | PRN

## 2023-09-18 MED ORDER — ASPIRIN 81 MG PO CHEW
CHEWABLE_TABLET | ORAL | Status: DC | PRN
  Administered 2023-09-18: 81 mg via ORAL

## 2023-09-18 MED ORDER — SODIUM CHLORIDE 0.9% FLUSH
3.0000 mL | Freq: Two times a day (BID) | INTRAVENOUS | Status: DC
  Administered 2023-09-19 (×2): 3 mL via INTRAVENOUS

## 2023-09-18 MED ORDER — LIDOCAINE HCL (PF) 1 % IJ SOLN
INTRAMUSCULAR | Status: DC | PRN
  Administered 2023-09-18: 15 mL

## 2023-09-18 MED ORDER — MIDAZOLAM HCL 2 MG/2ML IJ SOLN
INTRAMUSCULAR | Status: AC
  Filled 2023-09-18: qty 2

## 2023-09-18 MED ORDER — CLOPIDOGREL BISULFATE 75 MG PO TABS
ORAL_TABLET | ORAL | Status: AC
  Filled 2023-09-18: qty 1

## 2023-09-18 MED ORDER — LIDOCAINE HCL (PF) 1 % IJ SOLN
INTRAMUSCULAR | Status: AC
Start: 2023-09-18 — End: ?
  Filled 2023-09-18: qty 30

## 2023-09-18 MED ORDER — HYDRALAZINE HCL 20 MG/ML IJ SOLN: 5.0000 mg | INTRAMUSCULAR | Status: DC | PRN

## 2023-09-18 MED ORDER — FENTANYL CITRATE (PF) 100 MCG/2ML IJ SOLN
INTRAMUSCULAR | Status: DC | PRN
  Administered 2023-09-18 (×2): 50 ug via INTRAVENOUS
  Administered 2023-09-18: 50 ug

## 2023-09-18 MED ORDER — FENTANYL CITRATE (PF) 100 MCG/2ML IJ SOLN
INTRAMUSCULAR | Status: AC
  Filled 2023-09-18: qty 2

## 2023-09-18 MED ORDER — SODIUM CHLORIDE 0.9 % WEIGHT BASED INFUSION
1.0000 mL/kg/h | INTRAVENOUS | Status: AC
  Administered 2023-09-18: 1 mL/kg/h via INTRAVENOUS

## 2023-09-18 MED ORDER — CLOPIDOGREL BISULFATE 75 MG PO TABS
ORAL_TABLET | ORAL | Status: DC | PRN
  Administered 2023-09-18: 75 mg via ORAL

## 2023-09-18 MED ORDER — MIDAZOLAM HCL 2 MG/2ML IJ SOLN
INTRAMUSCULAR | Status: DC | PRN
  Administered 2023-09-18 (×2): 1 mg via INTRAVENOUS

## 2023-09-18 MED ORDER — HEPARIN SODIUM (PORCINE) 5000 UNIT/ML IJ SOLN
5000.0000 [IU] | Freq: Three times a day (TID) | INTRAMUSCULAR | Status: DC
  Administered 2023-09-18 – 2023-09-20 (×5): 5000 [IU] via SUBCUTANEOUS
  Filled 2023-09-18 (×5): qty 1

## 2023-09-18 MED ORDER — ARFORMOTEROL TARTRATE 15 MCG/2ML IN NEBU
15.0000 ug | INHALATION_SOLUTION | Freq: Every day | RESPIRATORY_TRACT | Status: DC
Start: 2023-09-18 — End: 2023-09-20
  Administered 2023-09-18 – 2023-09-20 (×3): 15 ug via RESPIRATORY_TRACT
  Filled 2023-09-18 (×3): qty 2

## 2023-09-18 MED ORDER — HEPARIN (PORCINE) IN NACL 1000-0.9 UT/500ML-% IV SOLN
INTRAVENOUS | Status: DC | PRN
  Administered 2023-09-18 (×2): 500 mL

## 2023-09-18 MED ORDER — HEPARIN SODIUM (PORCINE) 1000 UNIT/ML IJ SOLN
INTRAMUSCULAR | Status: AC
  Filled 2023-09-18: qty 10

## 2023-09-18 MED ORDER — SODIUM CHLORIDE 0.9% FLUSH: 3.0000 mL | INTRAVENOUS | Status: DC | PRN

## 2023-09-18 MED ORDER — IODIXANOL 320 MG/ML IV SOLN
INTRAVENOUS | Status: DC | PRN
  Administered 2023-09-18: 105 mL

## 2023-09-18 MED ORDER — ASPIRIN 81 MG PO CHEW
CHEWABLE_TABLET | ORAL | Status: AC
Start: 2023-09-18 — End: ?
  Filled 2023-09-18: qty 1

## 2023-09-18 SURGICAL SUPPLY — 15 items
CATH OMNI FLUSH 5F 65CM (CATHETERS) IMPLANT
CATH QUICKCROSS .035X135CM (MICROCATHETER) IMPLANT
CATH SHOCKWAVE M5 4.5X60 (CATHETERS) IMPLANT
COVER DOME SNAP 22 D (MISCELLANEOUS) IMPLANT
DEVICE CLOSURE MYNXGRIP 6/7F (Vascular Products) IMPLANT
GLIDEWIRE ADV .035X260CM (WIRE) IMPLANT
KIT ENCORE 26 ADVANTAGE (KITS) IMPLANT
KIT MICROPUNCTURE NIT STIFF (SHEATH) IMPLANT
SHEATH CATAPULT 6FR 60 (SHEATH) IMPLANT
SHEATH PINNACLE 5F 10CM (SHEATH) IMPLANT
SHEATH PINNACLE 6F 10CM (SHEATH) IMPLANT
SHEATH PROBE COVER 6X72 (BAG) IMPLANT
TRAY PV CATH (CUSTOM PROCEDURE TRAY) ×1 IMPLANT
WIRE BENTSON .035X145CM (WIRE) IMPLANT
WIRE SPARTACORE .014X300CM (WIRE) IMPLANT

## 2023-09-18 NOTE — Care Management Important Message (Signed)
Important Message  Patient Details  Name: [name] MRN: [id number] Date of Birth: [birth year]   Important Message Given:  Yes - Medicare IM     Sherilyn Banker 09/18/2023, 2:07 PM

## 2023-09-18 NOTE — Op Note (Addendum)
Patient name: [name] MRN: [id number] DOB: 1946-03-07 Sex: male  09/18/2023 Pre-operative Diagnosis: Left lower extremity critical limb ischemia with tissue loss at the digits and forefoot Post-operative diagnosis:  Same Surgeon:  Donald Sparrow, MD Procedure Performed: 1.  Ultrasound-guided micropuncture access of the right common femoral artery in retrograde fashion 2.  Aortogram 3.  Second-order cannulation, left lower extremity angiogram 4.  Third order cannulation, left lower extremity angiogram 5.  Balloon lithotripsy 4.5 x 60 mm P1 segment of the popliteal artery 6.  Device assisted closure-Mynx 7.  Contrast volume 105 mL, sedation time 47 minutes   Indications:  Pt with critical limb ischemia with tissue loss requiring TMA. Nonpalpable pulses. After discussing angiogram to define and hopefullly improve perfusion for wound healing, Carlson elected to proceed.   Findings:  Bilateral renal arteries patent Infrarenal abdominal aneurysm Bilateral common iliac artery stents patent without stenosis, bilateral hypogastric arteries patent, bilateral external iliac arteries patent  On the left: Common femoral artery patent, profunda patent.  Superficial femoral artery with moderate disease, the distal aspect had 2 lesions with 50% stenosis.  Femur hardware and fibular hardware made imaging difficult however, the popliteal artery with 70% stenosis in the P2 segment, no stenosis appreciated in P3. Severely calcified three-vessel runoff to the foot, posterior tibial artery dominant the dorsalis pedis and plantar arteries are patent.  Small vessel disease of the level of the toes.   Procedure:  The patient was identified in the holding area and taken to room 8.  The patient was then placed supine on the table and prepped and draped in the usual sterile fashion.  A time out was called.  Ultrasound was used to evaluate the right common femoral artery.  It was patent .  A digital ultrasound  image was acquired.  A micropuncture needle was used to access the right common femoral artery under ultrasound guidance.  An 018 wire was advanced without resistance and a micropuncture sheath was placed.  The 018 wire was removed and a benson wire was placed.  The micropuncture sheath was exchanged for a 5 french sheath.  An omniflush catheter was advanced over the wire to the level of L-1.  An abdominal angiogram was obtained.  Next, using the omniflush catheter and a benson wire, the aortic bifurcation was crossed and the catheter was placed into theleft external iliac artery and left runoff was obtained.  I elected to attempt intervention on the left P1 segment of the popliteal artery.  A 6 x 65 cm sheath was parked in the superficial femoral artery and the patient was heparinized.  Next, a series of wires and catheters were used to cross the lesion.  Multiple projections were taken due to the significant metal hardware within the left leg there appeared to be 70% stenosis that was eccentric.  I did not want to dissected this lesion, therefore I elected to use balloon lithotripsy.  A 4.5 x 60 mm lithotripsy balloon was brought onto the field and placed across the lesion.  This was inflated to 2 atm and 2, 30 pulse runs followed.  Follow-up angiography demonstrated significant improvement to 30% stenosis.  I was happy with this as the lesion had improved, and there was no dissection.  I did not want to place a stent as this was in the popliteal fossa.  Right groin arteriotomy managed with a minx device without issue  Impression: Successful balloon lithotripsy to the P2 segment of the popliteal artery resulting  in less than 30% residual stenosis.   Donald Sparrow MD [facility] Office: [phone]

## 2023-09-18 NOTE — Progress Notes (Signed)
  Daily Progress Note   Subjective: No complaints  Objective: Vitals:   09/18/23 0815 09/18/23 1518  BP: (!) 157/77   Pulse: 68   Resp: 18   Temp: 98.2 F (36.8 C)   SpO2: 93% 93%    Physical Examination Comfortable Palpable femoral pulses Nonlabored breathing Regular rate  ASSESSMENT/PLAN:  Pt with critical limb ischemia with tissue loss requiring TMA. Nonpalpable pulses. After discussing angiogram to define and hopefullly improve perfusion for wound healing, [name] elected to proceed.    Donald Sparrow MD MS [facility] [phone] 09/18/2023  3:22 PM

## 2023-09-18 NOTE — Progress Notes (Signed)
PROGRESS NOTE    [name]  ZOX:[id number] DOB: [birth year] DOA: 09/15/2023 PCP: Bradd Canary, MD   Brief Narrative:  77 y.o. male with medical history significant of hypertension, hyperlipidemia, carotid artery disease,  COPD on 4 L of oxygen at baseline, peripheral artery disease s/p left iliac stents, osteomyelitis s/p left hallux amputation 05/2019 ,hypothyroidism, AAA presented with pain and swelling of his left second toe with redness and drainage.  He was started on broad-spectrum antibiotics.  Workup revealed third toe cellulitis with osteomyelitis of the third middle and distal phalanges.  Podiatry and vascular surgery were consulted.    Assessment & Plan:   Acute osteomyelitis of left third toe with cellulitis of the left foot/diabetic foot ulcer PAD -Continue broad-spectrum antibiotics.  Podiatry and vascular surgery following.  Vascular surgery planning for aortogram today.  Will need surgical intervention by podiatry after vascular surgery intervention -Continue aspirin, Plavix and statin  Leukocytosis Resolved  Diabetes mellitus type 2 with hyperglycemia -A1c 6.6.  Continue long-acting insulin along with CBGs with SSI  Chronic respiratory failure with hypoxia COPD -Stable on baseline oxygen requirement of 4 L via nasal cannula -Continue current nebulizers  Hypertension Hyperlipidemia -Continue Coreg, losartan and statin  Generalized anxiety disorder -continue Prozac and Xanax as needed  Memory loss -Continue Namenda  Hypothyroidism -Continue Synthroid  Obesity -Outpatient follow-up  AAA without rupture Outpatient follow-up with vascular surgery    DVT prophylaxis: Lovenox Code Status: Full Family Communication: Wife at bedside Disposition Plan: Status is: Inpatient Remains inpatient appropriate because: Of severity of illness    Consultants: Podiatry/vascular surgery  Procedures: None  Antimicrobials:  Anti-infectives (From admission,  onward)    Start     Dose/Rate Route Frequency Ordered Stop   09/16/23 1200  vancomycin (VANCOREADY) IVPB 1750 mg/350 mL        1,750 mg 175 mL/hr over 120 Minutes Intravenous Every 24 hours 09/15/23 1549     09/16/23 1100  cefTRIAXone (ROCEPHIN) 2 g in sodium chloride 0.9 % 100 mL IVPB  Status:  Discontinued        2 g 200 mL/hr over 30 Minutes Intravenous Every 24 hours 09/15/23 1439 09/16/23 0901   09/16/23 1000  piperacillin-tazobactam (ZOSYN) IVPB 3.375 g        3.375 g 12.5 mL/hr over 240 Minutes Intravenous Every 8 hours 09/16/23 0902     09/16/23 0930  metroNIDAZOLE (FLAGYL) IVPB 500 mg  Status:  Discontinued        500 mg 100 mL/hr over 60 Minutes Intravenous Every 12 hours 09/16/23 0834 09/16/23 0901   09/15/23 1145  vancomycin (VANCOREADY) IVPB 2000 mg/400 mL        2,000 mg 200 mL/hr over 120 Minutes Intravenous  Once 09/15/23 1132 09/15/23 1457   09/15/23 1145  cefTRIAXone (ROCEPHIN) 2 g in sodium chloride 0.9 % 100 mL IVPB        2 g 200 mL/hr over 30 Minutes Intravenous  Once 09/15/23 1132 09/15/23 1311   09/15/23 1115  vancomycin (VANCOCIN) IVPB 1000 mg/200 mL premix  Status:  Discontinued        1,000 mg 200 mL/hr over 60 Minutes Intravenous  Once 09/15/23 1104 09/15/23 1132   09/15/23 1115  cefTRIAXone (ROCEPHIN) 1 g in sodium chloride 0.9 % 100 mL IVPB  Status:  Discontinued        1 g 200 mL/hr over 30 Minutes Intravenous  Once 09/15/23 1104 09/15/23 1132        Subjective: Patient  seen and examined at bedside.  No fever, vomiting, chest pain reported.  Objective: Vitals:   09/17/23 2111 09/18/23 0453 09/18/23 0808 09/18/23 0815  BP: (!) 148/62 (!) 147/67  (!) 157/77  Pulse: 64 65 65 68  Resp: 18 18 18 18   Temp: 98.6 F (37 C) 97.9 F (36.6 C)  98.2 F (36.8 C)  TempSrc: Oral Oral    SpO2: 98% 97% 97% 93%  Weight:      Height:        Intake/Output Summary (Last 24 hours) at 09/18/2023 1021 Last data filed at 09/18/2023 0400 Gross per 24 hour   Intake 2361.61 ml  Output 300 ml  Net 2061.61 ml   Filed Weights   09/15/23 1002  Weight: 120.2 kg    Examination:  General exam: Appears calm and comfortable.  On room air.  Looks chronically ill and deconditioned. Respiratory system: Bilateral decreased breath sounds at bases with some scattered crackles Cardiovascular system: S1 & S2 heard, Rate controlled Gastrointestinal system: Abdomen is obese, nondistended, soft and nontender. Normal bowel sounds heard. Extremities: No cyanosis, clubbing, edema  Central nervous system: Alert and oriented. No focal neurological deficits. Moving extremities Skin: Left foot with dressing present.  No ecchymosis Psychiatry: Judgement and insight appear normal. Mood & affect appropriate.     Data Reviewed: I have personally reviewed following labs and imaging studies  CBC: Recent Labs  Lab 09/15/23 1007 09/16/23 0732  WBC 11.8* 9.7  NEUTROABS 9.0*  --   HGB 13.6 13.7  HCT 42.1 43.0  MCV 99.5 99.5  PLT 209 222   Basic Metabolic Panel: Recent Labs  Lab 09/15/23 1007 09/16/23 0732  NA 137 140  K 4.6 4.7  CL 100 103  CO2 26 26  GLUCOSE 142* 165*  BUN 15 15  CREATININE 0.89 0.82  CALCIUM 9.0 9.2   GFR: Estimated Creatinine Clearance: 98.1 mL/min (by C-G formula based on SCr of 0.82 mg/dL). Liver Function Tests: Recent Labs  Lab 09/15/23 1007  AST 17  ALT 18  ALKPHOS 69  BILITOT 0.7  PROT 6.5  ALBUMIN 3.2*   No results for input(s): "LIPASE", "AMYLASE" in the last 168 hours. No results for input(s): "AMMONIA" in the last 168 hours. Coagulation Profile: No results for input(s): "INR", "PROTIME" in the last 168 hours. Cardiac Enzymes: No results for input(s): "CKTOTAL", "CKMB", "CKMBINDEX", "TROPONINI" in the last 168 hours. BNP (last 3 results) No results for input(s): "PROBNP" in the last 8760 hours. HbA1C: Recent Labs    09/15/23 1140  HGBA1C 6.6*   CBG: Recent Labs  Lab 09/17/23 0806 09/17/23 1149  09/17/23 1757 09/17/23 2112 09/18/23 0818  GLUCAP 194* 137* 192* 176* 143*   Lipid Profile: No results for input(s): "CHOL", "HDL", "LDLCALC", "TRIG", "CHOLHDL", "LDLDIRECT" in the last 72 hours. Thyroid Function Tests: No results for input(s): "TSH", "T4TOTAL", "FREET4", "T3FREE", "THYROIDAB" in the last 72 hours. Anemia Panel: No results for input(s): "VITAMINB12", "FOLATE", "FERRITIN", "TIBC", "IRON", "RETICCTPCT" in the last 72 hours. Sepsis Labs: Recent Labs  Lab 09/15/23 1017  LATICACIDVEN 1.3    Recent Results (from the past 240 hour(s))  Blood culture (routine x 2)     Status: None (Preliminary result)   Collection Time: 09/15/23 11:03 AM   Specimen: BLOOD  Result Value Ref Range Status   Specimen Description BLOOD LEFT ANTECUBITAL  Final   Special Requests   Final    BOTTLES DRAWN AEROBIC AND ANAEROBIC Blood Culture adequate volume   Culture  Final    NO GROWTH 3 DAYS Performed at [facility], [address], [place], [place] [address]    Report Status PENDING  Incomplete  Blood culture (routine x 2)     Status: None (Preliminary result)   Collection Time: 09/15/23 11:08 AM   Specimen: BLOOD  Result Value Ref Range Status   Specimen Description BLOOD RIGHT ANTECUBITAL  Final   Special Requests   Final    BOTTLES DRAWN AEROBIC AND ANAEROBIC Blood Culture adequate volume   Culture   Final    NO GROWTH 3 DAYS Performed at [facility], [address] [place], [place] [address]    Report Status PENDING  Incomplete         Radiology Studies: No results found.      Scheduled Meds:  arformoterol  15 mcg Nebulization Daily   aspirin EC  81 mg Oral QHS   atorvastatin  80 mg Oral Daily   budesonide  0.25 mg Nebulization Daily   carvedilol  12.5 mg Oral BID WC   clopidogrel  75 mg Oral Daily   enoxaparin (LOVENOX) injection  60 mg Subcutaneous Q24H   ezetimibe  10 mg Oral Daily   FLUoxetine  40 mg Oral Daily   insulin aspart  0-15 Units  Subcutaneous TID WC   insulin glargine-yfgn  10 Units Subcutaneous Daily   latanoprost  1 drop Both Eyes BID   levothyroxine  25 mcg Oral Daily   losartan  50 mg Oral Daily   magnesium gluconate  500 mg Oral Daily   memantine  5 mg Oral BID   pregabalin  150 mg Oral BID   sodium chloride flush  3 mL Intravenous Q12H   zinc sulfate (50mg  elemental zinc)  220 mg Oral Daily   Continuous Infusions:  piperacillin-tazobactam (ZOSYN)  IV 3.375 g (09/18/23 0202)   vancomycin 1,750 mg (09/17/23 1413)          Glade Lloyd, MD [facility] 09/18/2023, 10:21 AM

## 2023-09-18 NOTE — Plan of Care (Signed)

## 2023-09-18 NOTE — Progress Notes (Signed)
Patient off unit, cath Lab

## 2023-09-19 ENCOUNTER — Inpatient Hospital Stay (HOSPITAL_COMMUNITY): Payer: Medicare HMO

## 2023-09-19 ENCOUNTER — Other Ambulatory Visit: Payer: Self-pay | Admitting: Family Medicine

## 2023-09-19 ENCOUNTER — Encounter (HOSPITAL_COMMUNITY): Payer: Self-pay | Admitting: Vascular Surgery

## 2023-09-19 ENCOUNTER — Encounter (HOSPITAL_COMMUNITY)
Admission: EM | Disposition: A | Discharge status: Home Health | Payer: Self-pay | Source: Home / Self Care | Attending: Internal Medicine

## 2023-09-19 ENCOUNTER — Other Ambulatory Visit: Payer: Self-pay

## 2023-09-19 DIAGNOSIS — M869 Osteomyelitis, unspecified: Secondary | ICD-10-CM

## 2023-09-19 DIAGNOSIS — E1169 Type 2 diabetes mellitus with other specified complication: Secondary | ICD-10-CM

## 2023-09-19 DIAGNOSIS — L03116 Cellulitis of left lower limb: Secondary | ICD-10-CM | POA: Diagnosis not present

## 2023-09-19 HISTORY — PX: AMPUTATION TOE: SHX6595

## 2023-09-19 LAB — LIPID PANEL
Cholesterol: 122 mg/dL (ref 0–200)
HDL: 28 mg/dL — ABNORMAL LOW (ref >40)
LDL Cholesterol: 73 mg/dL (ref 0–99)
Total CHOL/HDL Ratio: 4.4 {ratio}
Triglycerides: 107 mg/dL (ref <150)
VLDL: 21 mg/dL (ref 0–40)

## 2023-09-19 LAB — BASIC METABOLIC PANEL
Anion gap: 8 (ref 5–15)
BUN: 20 mg/dL (ref 8–23)
CO2: 27 mmol/L (ref 22–32)
Calcium: 8.3 mg/dL — ABNORMAL LOW (ref 8.9–10.3)
Chloride: 99 mmol/L (ref 98–111)
Creatinine, Ser: 0.84 mg/dL (ref 0.61–1.24)
GFR, Estimated: >60 mL/min (ref >60)
Glucose, Bld: 126 mg/dL — ABNORMAL HIGH (ref 70–99)
Potassium: 3.9 mmol/L (ref 3.5–5.1)
Sodium: 134 mmol/L — ABNORMAL LOW (ref 135–145)

## 2023-09-19 LAB — CBC WITH DIFFERENTIAL/PLATELET
Abs Immature Granulocytes: 0.06 10*3/uL (ref 0.00–0.07)
Basophils Absolute: 0.1 10*3/uL (ref 0.0–0.1)
Basophils Relative: 1 %
Eosinophils Absolute: 0.2 10*3/uL (ref 0.0–0.5)
Eosinophils Relative: 3 %
HCT: 39.7 % (ref 39.0–52.0)
Hemoglobin: 12.5 g/dL — ABNORMAL LOW (ref 13.0–17.0)
Immature Granulocytes: 1 %
Lymphocytes Relative: 20 %
Lymphs Abs: 1.6 10*3/uL (ref 0.7–4.0)
MCH: 31.4 pg (ref 26.0–34.0)
MCHC: 31.5 g/dL (ref 30.0–36.0)
MCV: 99.7 fL (ref 80.0–100.0)
Monocytes Absolute: 0.9 10*3/uL (ref 0.1–1.0)
Monocytes Relative: 11 %
Neutro Abs: 5.1 10*3/uL (ref 1.7–7.7)
Neutrophils Relative %: 64 %
Platelets: 193 10*3/uL (ref 150–400)
RBC: 3.98 MIL/uL — ABNORMAL LOW (ref 4.22–5.81)
RDW: 12.8 % (ref 11.5–15.5)
WBC: 7.8 10*3/uL (ref 4.0–10.5)
nRBC: 0 % (ref 0.0–0.2)

## 2023-09-19 LAB — GLUCOSE, CAPILLARY
Glucose-Capillary: 118 mg/dL — ABNORMAL HIGH (ref 70–99)
Glucose-Capillary: 165 mg/dL — ABNORMAL HIGH (ref 70–99)
Glucose-Capillary: 104 mg/dL — ABNORMAL HIGH (ref 70–99)
Glucose-Capillary: 127 mg/dL — ABNORMAL HIGH (ref 70–99)
Glucose-Capillary: 222 mg/dL — ABNORMAL HIGH (ref 70–99)

## 2023-09-19 LAB — VANCOMYCIN, TROUGH: Vancomycin Tr: 10 ug/mL — ABNORMAL LOW (ref 15–20)

## 2023-09-19 LAB — C-REACTIVE PROTEIN: CRP: 2.9 mg/dL — ABNORMAL HIGH (ref <1.0)

## 2023-09-19 LAB — MAGNESIUM: Magnesium: 1.8 mg/dL (ref 1.7–2.4)

## 2023-09-19 SURGERY — AMPUTATION, TOE
Anesthesia: Monitor Anesthesia Care | Site: Toe | Laterality: Left

## 2023-09-19 MED ORDER — ORAL CARE MOUTH RINSE: 15.0000 mL | Freq: Once | OROMUCOSAL | Status: AC

## 2023-09-19 MED ORDER — CHLORHEXIDINE GLUCONATE 0.12 % MT SOLN
OROMUCOSAL | Status: AC
  Administered 2023-09-19: 15 mL via OROMUCOSAL
  Filled 2023-09-19: qty 15

## 2023-09-19 MED ORDER — FENTANYL CITRATE (PF) 100 MCG/2ML IJ SOLN: 25.0000 ug | INTRAMUSCULAR | Status: DC | PRN

## 2023-09-19 MED ORDER — MIDAZOLAM HCL 2 MG/2ML IJ SOLN
INTRAMUSCULAR | Status: AC
Start: 2023-09-19 — End: ?
  Filled 2023-09-19: qty 2

## 2023-09-19 MED ORDER — ACETAMINOPHEN 325 MG PO TABS
650.0000 mg | ORAL_TABLET | ORAL | Status: DC | PRN
Start: 2023-09-19 — End: 2023-09-20

## 2023-09-19 MED ORDER — 0.9 % SODIUM CHLORIDE (POUR BTL) OPTIME
TOPICAL | Status: DC | PRN
  Administered 2023-09-19: 1000 mL

## 2023-09-19 MED ORDER — MIDAZOLAM HCL 2 MG/2ML IJ SOLN
INTRAMUSCULAR | Status: DC | PRN
  Administered 2023-09-19 (×2): 1 mg via INTRAVENOUS

## 2023-09-19 MED ORDER — ACETAMINOPHEN 500 MG PO TABS
1000.0000 mg | ORAL_TABLET | Freq: Once | ORAL | Status: AC
  Administered 2023-09-19: 1000 mg via ORAL
  Filled 2023-09-19: qty 2

## 2023-09-19 MED ORDER — LACTATED RINGERS IV SOLN: INTRAVENOUS | Status: DC | PRN

## 2023-09-19 MED ORDER — CHLORHEXIDINE GLUCONATE 0.12 % MT SOLN: 15.0000 mL | Freq: Once | OROMUCOSAL | Status: AC

## 2023-09-19 MED ORDER — INSULIN ASPART 100 UNIT/ML IJ SOLN
0.0000 [IU] | INTRAMUSCULAR | Status: DC | PRN
Start: 2023-09-19 — End: 2023-09-19

## 2023-09-19 MED ORDER — PROPOFOL 500 MG/50ML IV EMUL
INTRAVENOUS | Status: DC | PRN
  Administered 2023-09-19: 50 ug/kg/min via INTRAVENOUS
  Administered 2023-09-19: 75 mg via INTRAVENOUS

## 2023-09-19 MED ORDER — DEXMEDETOMIDINE HCL IN NACL 200 MCG/50ML IV SOLN
INTRAVENOUS | Status: DC | PRN
  Administered 2023-09-19: 8 ug via INTRAVENOUS
  Administered 2023-09-19: 4 ug via INTRAVENOUS

## 2023-09-19 MED ORDER — BUPIVACAINE HCL (PF) 0.25 % IJ SOLN
INTRAMUSCULAR | Status: AC
  Filled 2023-09-19: qty 10

## 2023-09-19 MED ORDER — VANCOMYCIN HCL IN DEXTROSE 1-5 GM/200ML-% IV SOLN
1000.0000 mg | Freq: Two times a day (BID) | INTRAVENOUS | Status: DC
  Administered 2023-09-19 – 2023-09-20 (×2): 1000 mg via INTRAVENOUS
  Filled 2023-09-19 (×3): qty 200

## 2023-09-19 MED ORDER — BUPIVACAINE HCL (PF) 0.25 % IJ SOLN
INTRAMUSCULAR | Status: DC | PRN
  Administered 2023-09-19: 7 mL

## 2023-09-19 SURGICAL SUPPLY — 31 items
BLADE LONG MED 31X9 (MISCELLANEOUS) IMPLANT
BLADE SURG 15 STRL LF DISP TIS (BLADE) IMPLANT
BLADE SURG 15 STRL SS (BLADE) ×1
BNDG ELASTIC 4INX 5YD STR LF (GAUZE/BANDAGES/DRESSINGS) IMPLANT
BNDG GAUZE DERMACEA FLUFF 4 (GAUZE/BANDAGES/DRESSINGS) IMPLANT
CNTNR URN SCR LID CUP LEK RST (MISCELLANEOUS) IMPLANT
CONT SPEC 4OZ STRL OR WHT (MISCELLANEOUS) ×2
COVER SURGICAL LIGHT HANDLE (MISCELLANEOUS) ×1 IMPLANT
DRSG XEROFORM 1X8 (GAUZE/BANDAGES/DRESSINGS) IMPLANT
GAUZE SPONGE 4X4 12PLY STRL (GAUZE/BANDAGES/DRESSINGS) IMPLANT
GLOVE BIOGEL M 7.0 STRL (GLOVE) ×1 IMPLANT
GLOVE PI ORTHO PRO STRL 7.5 (GLOVE) ×1 IMPLANT
GOWN STRL REUS W/ TWL LRG LVL3 (GOWN DISPOSABLE) ×2 IMPLANT
GOWN STRL REUS W/TWL LRG LVL3 (GOWN DISPOSABLE) ×2
KIT BASIN OR (CUSTOM PROCEDURE TRAY) ×1 IMPLANT
KIT TURNOVER KIT B (KITS) ×1 IMPLANT
NDL HYPO 25GX1X1/2 BEV (NEEDLE) IMPLANT
NEEDLE HYPO 25GX1X1/2 BEV (NEEDLE) ×1 IMPLANT
NS IRRIG 1000ML POUR BTL (IV SOLUTION) ×1 IMPLANT
PACK ORTHO EXTREMITY (CUSTOM PROCEDURE TRAY) ×1 IMPLANT
PAD ARMBOARD 7.5X6 YLW CONV (MISCELLANEOUS) ×2 IMPLANT
SOL PREP POV-IOD 4OZ 10% (MISCELLANEOUS) ×3 IMPLANT
SPECIMEN JAR SMALL (MISCELLANEOUS) ×1 IMPLANT
SUT ETHILON 3 0 PS 1 (SUTURE) ×1 IMPLANT
SUT MNCRL AB 3-0 PS2 27 (SUTURE) IMPLANT
SWAB COLLECTION DEVICE MRSA (MISCELLANEOUS) IMPLANT
SWAB CULTURE ESWAB REG 1ML (MISCELLANEOUS) IMPLANT
SYR CONTROL 10ML LL (SYRINGE) IMPLANT
TOWEL GREEN STERILE FF (TOWEL DISPOSABLE) ×1 IMPLANT
TUBE CONNECTING 12X1/4 (SUCTIONS) IMPLANT
UNDERPAD 30X36 HEAVY ABSORB (UNDERPADS AND DIAPERS) ×1 IMPLANT

## 2023-09-19 NOTE — Progress Notes (Signed)
  Subjective:  Patient ID: [name], male    DOB: Feb 12, 1946,  MRN: [id number]  Patient seen at bedside resting comfortably not having any pain his wife is present.   Negative for chest pain and shortness of breath Fever: no Night sweats: no Constitutional signs: no Objective:   Vitals:   09/19/23 0737 09/19/23 0745  BP: (!) 152/79   Pulse: 74   Resp: 17   Temp: 98.8 F (37.1 C)   SpO2: 95% 94%   General AA&O x3. Normal mood and affect.  Vascular Left foot nonpalpable pulses, mild edema  Neurologic Epicritic sensation grossly absent.  Dermatologic Dressing clean and dry without strikethrough  Orthopedic: MMT 5/5 in dorsiflexion, plantarflexion, inversion, and eversion. Normal joint ROM without pain or crepitus.   Angiography completed yesterday with balloon lithotripsy to popliteal artery with reduction from 70% to 30% stenosis  Assessment & Plan:  Patient was evaluated and treated and all questions answered.  PAD, osteomyelitis left third toe -OR today at 5 PM for left third toe amputation -We reviewed the results as angiography of discussed that blood flow is improved but is certainly not perfect.  He has significant small vessel disease.  We discussed the risk of nonhealing.  We discussed that if this did not heal he would need full transmetatarsal amputation. -He may breakfast but n.p.o. past 9 AM today  Edwin Cap, DPM  Accessible via secure chat for questions or concerns.

## 2023-09-19 NOTE — Transfer of Care (Signed)
Immediate Anesthesia Transfer of Care Note  Patient: [name]  Procedure(s) Performed: AMPUTATION OF THIRD TOE (Left: Toe)  Patient Location: PACU  Anesthesia Type:MAC  Level of Consciousness: awake, alert , and oriented  Airway & Oxygen Therapy: Patient Spontanous Breathing and Patient connected to nasal cannula oxygen  Post-op Assessment: Report given to RN and Post -op Vital signs reviewed and stable  Post vital signs: Reviewed and stable  Last Vitals:  Vitals Value Taken Time  BP 110/51 09/19/23 1831  Temp    Pulse 65 09/19/23 1833  Resp 25 09/19/23 1833  SpO2 96 % 09/19/23 1833  Vitals shown include unfiled device data.  Last Pain:  Vitals:   09/19/23 1645  TempSrc:   PainSc: 0-No pain         Complications: No notable events documented.

## 2023-09-19 NOTE — Anesthesia Postprocedure Evaluation (Signed)
Anesthesia Post Note  Patient: [name]  Procedure(s) Performed: AMPUTATION OF THIRD TOE (Left: Toe)     Patient location during evaluation: PACU Anesthesia Type: MAC Level of consciousness: awake and alert Pain management: pain level controlled Vital Signs Assessment: post-procedure vital signs reviewed and stable Respiratory status: spontaneous breathing, nonlabored ventilation, respiratory function stable and patient connected to nasal cannula oxygen Cardiovascular status: stable and blood pressure returned to baseline Postop Assessment: no apparent nausea or vomiting Anesthetic complications: no   No notable events documented.  Last Vitals:  Vitals:   09/19/23 1645 09/19/23 1830  BP: 137/69 (!) 110/51  Pulse: 65 66  Resp: 16 12  Temp:  36.6 C  SpO2: 95% 98%    Last Pain:  Vitals:   09/19/23 1830  TempSrc:   PainSc: Asleep                 Mariann Barter

## 2023-09-19 NOTE — Progress Notes (Signed)
Pharmacy Antibiotic Note  [name] is a 77 y.o. male admitted on 09/15/2023 with  diabetic foot infection .  Pharmacy has been consulted for Zosyn dosing. Discussed with Dr. Lilian Kapur, Podiatry - want both anaerobic and pseudomonas coverage so changing to Zosyn therapy.  Plans for OR this afternoon for third L toe amputation.   11/14 VT = 10 mcg/mL (drawn 20 min prior to dose), reflective of 1750 mg IV Q24h @ Css.  Yields Ke 0.06, est vanc CL 65 mL/min  Plan: Continue Zosyn 3.375g IV q8h (4 hour infusion). Adjust vancomycin to 1000 mg IV Q12h F/u abx plan after amputation  Height: 5\' 9"  (175.3 cm) Weight: 120.2 kg (265 lb) IBW/kg (Calculated) : 70.7  Temp (24hrs), Avg:98.6 F (37 C), Min:98 F (36.7 C), Max:99.2 F (37.3 C)  Recent Labs  Lab 09/15/23 1007 09/15/23 1017 09/16/23 0732 09/18/23 1111 09/18/23 2000 09/19/23 0528 09/19/23 1109  WBC 11.8*  --  9.7  --  8.5 7.8  --   CREATININE 0.89  --  0.82 0.82  --  0.84  --   LATICACIDVEN  --  1.3  --   --   --   --   --   VANCOTROUGH  --   --   --   --   --   --  10*    Estimated Creatinine Clearance: 95.8 mL/min (by C-G formula based on SCr of 0.84 mg/dL).    Allergies  Allergen Reactions   Metformin Diarrhea   Prednisone Itching    Antimicrobials this admission: Ceftriaxone 11/10 x1 dose Vancomycin 11/10 >> c Zosyn 11/11 >> c  Dose adjustments this admission: Vancomycin 1750 mg IV Q24h >> 11/14 VT = 10 mcg/mL (drawn appropriately, Ke=0.06) >> 1000 mg IV Q12h (eAUC 565)  Microbiology results: 11/10 BCx: ngtd x4 days  Thank you for allowing pharmacy to be a part of this patient's care.  Trixie Rude, PharmD Clinical Pharmacist 09/19/2023  12:55 PM

## 2023-09-19 NOTE — Anesthesia Preprocedure Evaluation (Addendum)
Anesthesia Evaluation  Patient identified by MRN, date of birth, ID band Patient awake    Reviewed: Allergy & Precautions, NPO status , Patient's Chart, lab work & pertinent test results, Unable to perform ROS - Chart review only  Airway Mallampati: III  TM Distance: >3 FB Neck ROM: Full    Dental  (+) Edentulous Upper, Edentulous Lower   Pulmonary shortness of breath, asthma , sleep apnea , pneumonia, COPD,  COPD inhaler and oxygen dependent, former smoker   Pulmonary exam normal breath sounds clear to auscultation       Cardiovascular hypertension, Pt. on medications + CAD and + Peripheral Vascular Disease  Normal cardiovascular exam+ dysrhythmias  Rhythm:Regular Rate:Normal  Echo 09/2021  1. Left ventricular ejection fraction, by estimation, is 55 to 60%. The left ventricle has normal function. The left ventricle has no regional wall motion abnormalities. Left ventricular diastolic parameters were normal.   2. Right ventricular systolic function is normal. The right ventricular size is mildly enlarged. Tricuspid regurgitation signal is inadequate for assessing PA pressure.   3. The mitral valve is grossly normal. No evidence of mitral valve regurgitation. No evidence of mitral stenosis.   4. The aortic valve is tricuspid. There is mild calcification of the aortic valve. Aortic valve regurgitation is not visualized. No aortic stenosis is present.   5. The inferior vena cava is dilated in size with >50% respiratory variability, suggesting right atrial pressure of 8 mmHg.     Neuro/Psych  PSYCHIATRIC DISORDERS Anxiety Depression     Neuromuscular disease CVA    GI/Hepatic Neg liver ROS,GERD  ,,  Endo/Other  diabetes, Type 2, Oral Hypoglycemic AgentsHypothyroidism  Class 3 obesity  Renal/GU Renal disease     Musculoskeletal  (+) Arthritis ,    Abdominal  (+) + obese  Peds  Hematology  (+) Blood dyscrasia (Plavix), anemia    Anesthesia Other Findings   Reproductive/Obstetrics                             Anesthesia Physical Anesthesia Plan  ASA: 4  Anesthesia Plan: MAC   Post-op Pain Management: Tylenol PO (pre-op)*   Induction: Intravenous  PONV Risk Score and Plan: 1 and Ondansetron, Treatment may vary due to age or medical condition and TIVA  Airway Management Planned: Natural Airway and Simple Face Mask  Additional Equipment:   Intra-op Plan:   Post-operative Plan:   Informed Consent: I have reviewed the patients History and Physical, chart, labs and discussed the procedure including the risks, benefits and alternatives for the proposed anesthesia with the patient or authorized representative who has indicated his/her understanding and acceptance.     Dental advisory given  Plan Discussed with: CRNA  Anesthesia Plan Comments:         Anesthesia Quick Evaluation

## 2023-09-19 NOTE — Progress Notes (Signed)
Orthopedic Tech Progress Note Patient Details:  [name] August 26, 1946 [id number]  Ortho Devices Type of Ortho Device: Postop shoe/boot Ortho Device/Splint Location: LLE Ortho Device/Splint Interventions: Ordered, Application   Post Interventions Patient Tolerated: Well  Tonye Pearson 09/19/2023, 7:25 PM

## 2023-09-19 NOTE — Progress Notes (Signed)
PROGRESS NOTE    [name] Park  ZOX:[id number] DOB: 1946/01/15 DOA: 09/15/2023 PCP: Bradd Canary, MD   Brief Narrative:  77 y.o. male with medical history significant of hypertension, hyperlipidemia, carotid artery disease,  COPD on 4 L of oxygen at baseline, peripheral artery disease s/p left iliac stents, osteomyelitis s/p left hallux amputation 05/2019 ,hypothyroidism, AAA presented with pain and swelling of his left second toe with redness and drainage.  He was started on broad-spectrum antibiotics.  Workup revealed third toe cellulitis with osteomyelitis of the third middle and distal phalanges.  Podiatry and vascular surgery were consulted.  He underwent electrogram and left lower extremity angiogram on 09/18/2023 by podiatry.   Assessment & Plan:   Acute osteomyelitis of left third toe with cellulitis of the left foot/diabetic foot ulcer PAD -Continue broad-spectrum antibiotics.  Podiatry and vascular surgery following.  -underwent electrogram and left lower extremity angiogram on 09/18/2023 by podiatry.  Follow further recommendations from vascular surgery.  Will need surgical intervention by podiatry  -Continue aspirin, Plavix and statin  Leukocytosis -Resolved  Diabetes mellitus type 2 with hyperglycemia -A1c 6.6.  Continue long-acting insulin along with CBGs with SSI.  Continue carb modified diet  Chronic respiratory failure with hypoxia COPD -Stable on baseline oxygen requirement of 4 L via nasal cannula -Continue current nebulizers  Hypertension Hyperlipidemia -Continue Coreg, losartan and statin  Generalized anxiety disorder -continue Prozac and Xanax as needed  Memory loss -Continue Namenda  Hypothyroidism -Continue Synthroid  Obesity -Outpatient follow-up  AAA without rupture -Outpatient follow-up with vascular surgery    DVT prophylaxis: Lovenox Code Status: Full Family Communication: Wife at bedside Disposition Plan: Status is:  Inpatient Remains inpatient appropriate because: Of severity of illness    Consultants: Podiatry/vascular surgery  Procedures: As above  Antimicrobials:  Anti-infectives (From admission, onward)    Start     Dose/Rate Route Frequency Ordered Stop   09/16/23 1200  vancomycin (VANCOREADY) IVPB 1750 mg/350 mL        1,750 mg 175 mL/hr over 120 Minutes Intravenous Every 24 hours 09/15/23 1549     09/16/23 1100  cefTRIAXone (ROCEPHIN) 2 g in sodium chloride 0.9 % 100 mL IVPB  Status:  Discontinued        2 g 200 mL/hr over 30 Minutes Intravenous Every 24 hours 09/15/23 1439 09/16/23 0901   09/16/23 1000  piperacillin-tazobactam (ZOSYN) IVPB 3.375 g        3.375 g 12.5 mL/hr over 240 Minutes Intravenous Every 8 hours 09/16/23 0902     09/16/23 0930  metroNIDAZOLE (FLAGYL) IVPB 500 mg  Status:  Discontinued        500 mg 100 mL/hr over 60 Minutes Intravenous Every 12 hours 09/16/23 0834 09/16/23 0901   09/15/23 1145  vancomycin (VANCOREADY) IVPB 2000 mg/400 mL        2,000 mg 200 mL/hr over 120 Minutes Intravenous  Once 09/15/23 1132 09/15/23 1457   09/15/23 1145  cefTRIAXone (ROCEPHIN) 2 g in sodium chloride 0.9 % 100 mL IVPB        2 g 200 mL/hr over 30 Minutes Intravenous  Once 09/15/23 1132 09/15/23 1311   09/15/23 1115  vancomycin (VANCOCIN) IVPB 1000 mg/200 mL premix  Status:  Discontinued        1,000 mg 200 mL/hr over 60 Minutes Intravenous  Once 09/15/23 1104 09/15/23 1132   09/15/23 1115  cefTRIAXone (ROCEPHIN) 1 g in sodium chloride 0.9 % 100 mL IVPB  Status:  Discontinued  1 g 200 mL/hr over 30 Minutes Intravenous  Once 09/15/23 1104 09/15/23 1132        Subjective: Patient seen and examined at bedside.  Denies worsening shortness of breath, chest pain, fever or vomiting.  Objective: Vitals:   09/18/23 1934 09/18/23 2013 09/18/23 2337 09/19/23 0332  BP: 128/66  (!) 141/61 (!) 174/91  Pulse: 69  62 71  Resp: 17  18 16   Temp:  98 F (36.7 C) 99.2 F (37.3  C) 98.4 F (36.9 C)  TempSrc:   Oral Oral  SpO2: 94%  96% 98%  Weight:      Height:        Intake/Output Summary (Last 24 hours) at 09/19/2023 0716 Last data filed at 09/19/2023 0455 Gross per 24 hour  Intake 1468.93 ml  Output 1050 ml  Net 418.93 ml   Filed Weights   09/15/23 1002  Weight: 120.2 kg    Examination:  General: Currently on room air.  No distress.  Chronically ill and deconditioned looking. ENT/neck: No thyromegaly.  JVD is not elevated  respiratory: Decreased breath sounds at bases bilaterally with some crackles; no wheezing  CVS: S1-S2 heard, rate controlled currently Abdominal: Soft, nontender, slightly distended; no organomegaly, bowel sounds are heard Extremities: Trace lower extremity edema; no cyanosis  CNS: Awake and alert.  No focal neurologic deficit.  Moves extremities Lymph: No obvious lymphadenopathy Skin: Left foot dressing present.  No petechiae. psych: Affect, judgment and mood are normal  musculoskeletal: No obvious joint swelling/deformity     Data Reviewed: I have personally reviewed following labs and imaging studies  CBC: Recent Labs  Lab 09/15/23 1007 09/16/23 0732 09/18/23 2000  WBC 11.8* 9.7 8.5  NEUTROABS 9.0*  --   --   HGB 13.6 13.7 12.7*  HCT 42.1 43.0 39.7  MCV 99.5 99.5 100.5*  PLT 209 222 212   Basic Metabolic Panel: Recent Labs  Lab 09/15/23 1007 09/16/23 0732 09/18/23 1111  NA 137 140 139  K 4.6 4.7 4.3  CL 100 103 100  CO2 26 26 30   GLUCOSE 142* 165* 139*  BUN 15 15 15   CREATININE 0.89 0.82 0.82  CALCIUM 9.0 9.2 9.0   GFR: Estimated Creatinine Clearance: 98.1 mL/min (by C-G formula based on SCr of 0.82 mg/dL). Liver Function Tests: Recent Labs  Lab 09/15/23 1007  AST 17  ALT 18  ALKPHOS 69  BILITOT 0.7  PROT 6.5  ALBUMIN 3.2*   No results for input(s): "LIPASE", "AMYLASE" in the last 168 hours. No results for input(s): "AMMONIA" in the last 168 hours. Coagulation Profile: No results for  input(s): "INR", "PROTIME" in the last 168 hours. Cardiac Enzymes: No results for input(s): "CKTOTAL", "CKMB", "CKMBINDEX", "TROPONINI" in the last 168 hours. BNP (last 3 results) No results for input(s): "PROBNP" in the last 8760 hours. HbA1C: No results for input(s): "HGBA1C" in the last 72 hours.  CBG: Recent Labs  Lab 09/17/23 2112 09/18/23 0818 09/18/23 1150 09/18/23 1724 09/18/23 2034  GLUCAP 176* 143* 151* 123* 199*   Lipid Profile: No results for input(s): "CHOL", "HDL", "LDLCALC", "TRIG", "CHOLHDL", "LDLDIRECT" in the last 72 hours. Thyroid Function Tests: No results for input(s): "TSH", "T4TOTAL", "FREET4", "T3FREE", "THYROIDAB" in the last 72 hours. Anemia Panel: No results for input(s): "VITAMINB12", "FOLATE", "FERRITIN", "TIBC", "IRON", "RETICCTPCT" in the last 72 hours. Sepsis Labs: Recent Labs  Lab 09/15/23 1017  LATICACIDVEN 1.3    Recent Results (from the past 240 hour(s))  Blood culture (routine x 2)  Status: None (Preliminary result)   Collection Time: 09/15/23 11:03 AM   Specimen: BLOOD  Result Value Ref Range Status   Specimen Description BLOOD LEFT ANTECUBITAL  Final   Special Requests   Final    BOTTLES DRAWN AEROBIC AND ANAEROBIC Blood Culture adequate volume   Culture   Final    NO GROWTH 3 DAYS Performed at [facility], [address] [place], [place] [address]    Report Status PENDING  Incomplete  Blood culture (routine x 2)     Status: None (Preliminary result)   Collection Time: 09/15/23 11:08 AM   Specimen: BLOOD  Result Value Ref Range Status   Specimen Description BLOOD RIGHT ANTECUBITAL  Final   Special Requests   Final    BOTTLES DRAWN AEROBIC AND ANAEROBIC Blood Culture adequate volume   Culture   Final    NO GROWTH 3 DAYS Performed at [facility], [address] [place], [place] [address]    Report Status PENDING  Incomplete         Radiology Studies: PERIPHERAL VASCULAR CATHETERIZATION  Result  Date: 09/18/2023 Images from the original result were not included. Patient name: [name] MRN: [id number] DOB: Aug 04, 1946 Sex: male 09/18/2023 Pre-operative Diagnosis: Left lower extremity critical limb ischemia with tissue loss at the digits and forefoot Post-operative diagnosis:  Same Surgeon:  Victorino Sparrow, MD Procedure Performed: 1.  Ultrasound-guided micropuncture access of the right common femoral artery in retrograde fashion 2.  Aortogram 3.  Second-order cannulation, left lower extremity angiogram 4.  Third order cannulation, left lower extremity angiogram 5.  Balloon lithotripsy 4.5 x 60 mm P1 segment of the popliteal artery 6.  Device assisted closure-Mynx 7.  Contrast volume 105 mL, sedation time 47 minutes Indications:  Pt with critical limb ischemia with tissue loss requiring TMA. Nonpalpable pulses. After discussing angiogram to define and hopefullly improve perfusion for wound healing, Karch elected to proceed. Findings: Bilateral renal arteries patent Infrarenal abdominal aneurysm Bilateral common iliac artery stents patent without stenosis, bilateral hypogastric arteries patent, bilateral external iliac arteries patent On the left: Common femoral artery patent, profunda patent.  Superficial femoral artery with moderate disease, the distal aspect had 2 lesions with 50% stenosis.  Femur hardware and fibular hardware made imaging difficult however, the popliteal artery with 70% stenosis in the P2 segment, no stenosis appreciated in P3.  Severely calcified Three-vessel runoff to the foot, posterior tibial artery dominant the dorsalis pedis and plantar arteries are patent.  Small vessel disease of the level of the toes.  Procedure:  The patient was identified in the holding area and taken to room 8.  The patient was then placed supine on the table and prepped and draped in the usual sterile fashion.  A time out was called.  Ultrasound was used to evaluate the right common femoral artery.  It was  patent .  A digital ultrasound image was acquired.  A micropuncture needle was used to access the right common femoral artery under ultrasound guidance.  An 018 wire was advanced without resistance and a micropuncture sheath was placed.  The 018 wire was removed and a benson wire was placed.  The micropuncture sheath was exchanged for a 5 french sheath.  An omniflush catheter was advanced over the wire to the level of L-1.  An abdominal angiogram was obtained.  Next, using the omniflush catheter and a benson wire, the aortic bifurcation was crossed and the catheter was placed into theleft  external iliac artery and left runoff was obtained. I elected to attempt intervention on the left P1 segment of the popliteal artery.  A 6 x 65 cm sheath was parked in the superficial femoral artery and the patient was heparinized.  Next, a series of wires and catheters were used to cross the lesion.  Multiple projections were taken due to the significant metal hardware within the left leg there appeared to be 70% stenosis that was eccentric.  I did not want to dissected this lesion, therefore I elected to use balloon lithotripsy.  A 4.5 x 60 mm lithotripsy balloon was brought onto the field and placed across the lesion.  This was inflated to 2 atm and 2, 30 pulse runs followed.  Follow-up angiography demonstrated significant improvement to 30% stenosis.  I was happy with this as the lesion had improved, and there was no dissection.  I did not want to place a stent as this was in the popliteal fossa. Right groin arteriotomy managed with a minx device without issue Impression: Successful balloon lithotripsy to the P2 segment of the popliteal artery resulting in less than 30% residual stenosis. Victorino Sparrow MD [facility] Office: [phone]        Scheduled Meds:  arformoterol  15 mcg Nebulization Daily   aspirin EC  81 mg Oral QHS   atorvastatin  80 mg Oral Daily   budesonide  0.25 mg  Nebulization Daily   carvedilol  12.5 mg Oral BID WC   clopidogrel  75 mg Oral Daily   ezetimibe  10 mg Oral Daily   FLUoxetine  40 mg Oral Daily   heparin  5,000 Units Subcutaneous Q8H   insulin aspart  0-15 Units Subcutaneous TID WC   insulin glargine-yfgn  10 Units Subcutaneous Daily   latanoprost  1 drop Both Eyes BID   levothyroxine  25 mcg Oral Daily   losartan  50 mg Oral Daily   magnesium gluconate  500 mg Oral Daily   memantine  5 mg Oral BID   pregabalin  150 mg Oral BID   sodium chloride flush  3 mL Intravenous Q12H   sodium chloride flush  3 mL Intravenous Q12H   zinc sulfate (50mg  elemental zinc)  220 mg Oral Daily   Continuous Infusions:  sodium chloride     sodium chloride     piperacillin-tazobactam (ZOSYN)  IV 3.375 g (09/19/23 0509)   vancomycin Stopped (09/18/23 1502)          Glade Lloyd, MD [facility] 09/19/2023, 7:16 AM

## 2023-09-19 NOTE — Progress Notes (Addendum)
  Daily Progress Note   Subjective: No complaints  Objective: Vitals:   09/19/23 0737 09/19/23 0745  BP: (!) 152/79   Pulse: 74   Resp: 17   Temp: 98.8 F (37.1 C)   SpO2: 95% 94%    Physical Examination Comfortable Palpable femoral pulses Palpable DP,PT in the left Soft right groin  Nonlabored breathing Regular rate  ASSESSMENT/PLAN:  Pt with critical limb ischemia with tissue loss requiring TMA.  Now s/p balloon lithotripsy popliteal artery  Pt maximally revascularized   Follow up scheduled    Victorino Sparrow MD MS [facility] [phone] 09/19/2023  8:13 AM

## 2023-09-19 NOTE — Plan of Care (Signed)

## 2023-09-19 NOTE — Op Note (Signed)
Patient Name: [name] DOB: Feb 19, 1946  MRN: [id number]   Date of Service: 09/15/2023 - 09/19/2023  Surgeon: Dr. Sharl Ma, DPM Assistants: None Pre-operative Diagnosis:  Left third toe osteomyelitis PAD Post-operative Diagnosis:  Left third toe osteomyelitis PAD Procedures: Procedures:   * AMPUTATION OF THIRD TOE Pathology/Specimens: ID Type Source Tests Collected by Time Destination  1 : Left Third Toe Amputation Toe, Left SURGICAL PATHOLOGY Edwin Cap, DPM 09/19/2023 1817   A : Deep Wound Culture Left Foot Wound Wound AEROBIC/ANAEROBIC CULTURE W GRAM STAIN (SURGICAL/DEEP WOUND) Edwin Cap, DPM 09/19/2023 1812   B : Left Third Toe Bone Culture Tissue Bone AEROBIC/ANAEROBIC CULTURE W GRAM STAIN (SURGICAL/DEEP WOUND) Edwin Cap, DPM 09/19/2023 1815    Anesthesia: Sedation with local Hemostasis: * No tourniquets in log * Estimated Blood Loss: 5 cc Materials: * No implants in log * Medications: 7 cc 0.25% Marcaine plain Complications: No complication noted  Indications for Procedure:  This is a 77 y.o. male with a history of PAD and type 2 diabetes.  He developed osteomyelitis of his third toe from a distal tip ulcer.  After undergoing revascularization he presents today for amputation   Procedure in Detail: Patient was identified in pre-operative holding area. Formal consent was signed and the left lower extremity was marked. Patient was brought back to the operating room. Anesthesia was induced. The extremity was prepped and draped in the usual sterile fashion. Timeout was taken to confirm patient name, laterality, and procedure prior to incision.   Attention was then directed to the left third toe where an incision was made in a racquet shaped incision. Dissection was carried down to level of bone.  Dissection was continued to the metatarsophalangeal joint and all collateral ligaments were freed at the joint.  The bone soft tissue attachments of the  proximal phalanx were removed and passed for pathology.  The remaining metatarsal head appeared healthy and viable.  The area was copiously irrigated.  Hemostasis was achieved.  Perfusion was adequate.  The skin was reapproximated with nylon and Monocryl.   The foot was then dressed with Xeroform and dry sterile dressings. Patient tolerated the procedure well.   Disposition: Following a period of post-operative monitoring, patient will be transferred to the floor.  He may be weightbearing as tolerated in postop shoe.  He will follow with me in 1 week.  7 days of oral antibiotics per culture data taken today should be sufficient.

## 2023-09-20 ENCOUNTER — Encounter (HOSPITAL_COMMUNITY): Payer: Self-pay | Admitting: Podiatry

## 2023-09-20 LAB — CBC WITH DIFFERENTIAL/PLATELET
Abs Immature Granulocytes: 0.05 10*3/uL (ref 0.00–0.07)
Basophils Absolute: 0 10*3/uL (ref 0.0–0.1)
Basophils Relative: 1 %
Eosinophils Absolute: 0.2 10*3/uL (ref 0.0–0.5)
Eosinophils Relative: 3 %
HCT: 37.7 % — ABNORMAL LOW (ref 39.0–52.0)
Hemoglobin: 12.1 g/dL — ABNORMAL LOW (ref 13.0–17.0)
Immature Granulocytes: 1 %
Lymphocytes Relative: 21 %
Lymphs Abs: 1.7 10*3/uL (ref 0.7–4.0)
MCH: 31.8 pg (ref 26.0–34.0)
MCHC: 32.1 g/dL (ref 30.0–36.0)
MCV: 99.2 fL (ref 80.0–100.0)
Monocytes Absolute: 0.8 10*3/uL (ref 0.1–1.0)
Monocytes Relative: 10 %
Neutro Abs: 5.3 10*3/uL (ref 1.7–7.7)
Neutrophils Relative %: 64 %
Platelets: 193 10*3/uL (ref 150–400)
RBC: 3.8 MIL/uL — ABNORMAL LOW (ref 4.22–5.81)
RDW: 12.9 % (ref 11.5–15.5)
WBC: 8.1 10*3/uL (ref 4.0–10.5)
nRBC: 0 % (ref 0.0–0.2)

## 2023-09-20 LAB — CULTURE, BLOOD (ROUTINE X 2)
Culture: NO GROWTH
Culture: NO GROWTH
Special Requests: ADEQUATE
Special Requests: ADEQUATE

## 2023-09-20 LAB — BASIC METABOLIC PANEL
Anion gap: 7 (ref 5–15)
BUN: 17 mg/dL (ref 8–23)
CO2: 26 mmol/L (ref 22–32)
Calcium: 8.5 mg/dL — ABNORMAL LOW (ref 8.9–10.3)
Chloride: 104 mmol/L (ref 98–111)
Creatinine, Ser: 0.92 mg/dL (ref 0.61–1.24)
GFR, Estimated: >60 mL/min (ref >60)
Glucose, Bld: 129 mg/dL — ABNORMAL HIGH (ref 70–99)
Potassium: 3.8 mmol/L (ref 3.5–5.1)
Sodium: 137 mmol/L (ref 135–145)

## 2023-09-20 LAB — GLUCOSE, CAPILLARY
Glucose-Capillary: 125 mg/dL — ABNORMAL HIGH (ref 70–99)
Glucose-Capillary: 179 mg/dL — ABNORMAL HIGH (ref 70–99)

## 2023-09-20 LAB — MAGNESIUM: Magnesium: 1.9 mg/dL (ref 1.7–2.4)

## 2023-09-20 MED ORDER — METHOCARBAMOL 500 MG PO TABS: 500.0000 mg | ORAL_TABLET | Freq: Four times a day (QID) | ORAL | 0 refills | Status: DC | PRN

## 2023-09-20 MED ORDER — DOXYCYCLINE HYCLATE 100 MG PO CAPS: 100.0000 mg | ORAL_CAPSULE | Freq: Two times a day (BID) | ORAL | 0 refills | Status: AC

## 2023-09-20 MED ORDER — METHOCARBAMOL 500 MG PO TABS
500.0000 mg | ORAL_TABLET | Freq: Four times a day (QID) | ORAL | Status: DC | PRN
  Administered 2023-09-20: 500 mg via ORAL
  Filled 2023-09-20: qty 1

## 2023-09-20 NOTE — TOC Transition Note (Signed)
Transition of Care [facility]) - CM/SW Discharge Note   Patient Details  Name: [name] MRN: [id number] Date of Birth: November 15, 1945  Transition [facility]) CM/SW Contact:  Epifanio Lesches, RN Phone Number: 09/20/2023, 12:07 PM   Clinical Narrative:    Patient will DC to: home  Anticipated DC date: 09/20/2023 Family notified: yes Transport by: car   Per MD patient ready for DC today. RN, patient, patient's wife notified of DC. Orders noted for home health services. Pt agreeable to home health services. Pt without provider preference.Referral made with [facility] and accepted. Pt without DME needs. Pt with DME home oxygen. Wife states pt will be ok without oxygen during short care ride home.States one home will ensure oxygen placed. Declined portable tank for ride home. Pt to pick up RX meds from local pharmacy.  Post hospital f/u noted on AVS.  Wife to provide transportation to home.  RNCM will sign off for now as intervention is no longer needed. Please consult [name] again if new needs arise.  Final next level of care: Home w Home Health Services Barriers to Discharge: No Barriers Identified   Patient Goals and CMS Choice   Choice offered to / list presented to : Patient, Spouse  Discharge Placement                         Discharge Plan and Services Additional resources added to the After Visit Summary for     Discharge Planning Services: CM Consult Post Acute Care Choice:  (await plan)          DME Arranged:  (await plan)         HH Arranged: PT, RN HH Agency: [facility] Date [facility] Agency Contacted: 09/20/23 Time HH Agency Contacted: [phone] Representative spoke with at [facility] Agency: Tresa Endo  Social Determinants of Health (SDOH) Interventions SDOH Screenings   Food Insecurity: Food Insecurity Present (09/18/2023)  Housing: Patient Declined (09/18/2023)  Transportation Needs: No Transportation Needs (09/18/2023)  Utilities: Not At  Risk (09/18/2023)  Alcohol Screen: Low Risk  (04/30/2023)  Depression (PHQ2-9): Low Risk  (09/03/2023)  Financial Resource Strain: Medium Risk (08/27/2023)  Physical Activity: Inactive (08/27/2023)  Social Connections: Moderately Isolated (08/27/2023)  Stress: No Stress Concern Present (08/27/2023)  Tobacco Use: Medium Risk (09/19/2023)  Health Literacy: Adequate Health Literacy (09/03/2023)     Readmission Risk Interventions     No data to display

## 2023-09-20 NOTE — Plan of Care (Signed)
  Problem: Health Behavior/Discharge Planning: Goal: Ability to manage health-related needs will improve Outcome: Progressing   Problem: Clinical Measurements: Goal: Ability to maintain clinical measurements within normal limits will improve Outcome: Progressing Goal: Respiratory complications will improve Outcome: Progressing Goal: Cardiovascular complication will be avoided Outcome: Progressing   Problem: Activity: Goal: Risk for activity intolerance will decrease Outcome: Progressing   Problem: Pain Management: Goal: General experience of comfort will improve Outcome: Progressing   Problem: Safety: Goal: Ability to remain free from injury will improve Outcome: Progressing

## 2023-09-20 NOTE — Progress Notes (Signed)
CSW received consult for patient. Food resources offered and accepted. All questions answered. SDOH complete. No further questions reported at this time.

## 2023-09-20 NOTE — Evaluation (Signed)
Physical Therapy Evaluation Patient Details Name: [name] MRN: [id number] DOB: 1946/06/02 Today's Date: 09/20/2023  History of Present Illness  77 y.o. male presents to [facility] hospital on 09/15/2023 with L third  toe swelling. Imaging notable for osteomyelitis. Pt underwent L 3rd toe amputation on 11/14. PMH includes HTN, HLD, CAD, COPD, PAD, AAA, hypothyroidism.  Clinical Impression  Pt presents to PT with deficits in strength, power, endurance, gait. Pt is able to ambulate for short household distances with UE support for stability. Pt is encouraged to utilize his rollator initially when returning home to reduce falls risk and to improve energy conservation. PT will continue to follow in the acute setting, however no post-acute PT services are recommended.        If plan is discharge home, recommend the following: A little help with bathing/dressing/bathroom;Assistance with cooking/housework;Assist for transportation;Help with stairs or ramp for entrance   Can travel by private vehicle        Equipment Recommendations None recommended by PT  Recommendations for Other Services       Functional Status Assessment Patient has had a recent decline in their functional status and demonstrates the ability to make significant improvements in function in a reasonable and predictable amount of time.     Precautions / Restrictions Precautions Precautions: Fall Required Braces or Orthoses: Other Brace Other Brace: post-op shoe Restrictions Weight Bearing Restrictions: Yes LLE Weight Bearing: Weight bearing as tolerated Other Position/Activity Restrictions: WBAT in post-op shoe      Mobility  Bed Mobility Overal bed mobility: Modified Independent                  Transfers Overall transfer level: Needs assistance Equipment used: None Transfers: Sit to/from Stand Sit to Stand: Supervision                Ambulation/Gait Ambulation/Gait assistance: Supervision Gait  Distance (Feet): 100 Feet Assistive device: IV Pole Gait Pattern/deviations: Step-through pattern Gait velocity: reduced Gait velocity interpretation: <1.8 ft/sec, indicate of risk for recurrent falls   General Gait Details: slowed step-through gait, reduced stance time on LLE  Stairs            Wheelchair Mobility     Tilt Bed    Modified Rankin (Stroke Patients Only)       Balance Overall balance assessment: Needs assistance Sitting-balance support: No upper extremity supported, Feet supported Sitting balance-Leahy Scale: Good     Standing balance support: Single extremity supported, Reliant on assistive device for balance Standing balance-Leahy Scale: Poor                               Pertinent Vitals/Pain Pain Assessment Pain Assessment: 0-10 Pain Score: 8  Pain Location: neck Pain Descriptors / Indicators: Sore Pain Intervention(s): Monitored during session    Home Living Family/patient expects to be discharged to:: Private residence Living Arrangements: Spouse/significant other Available Help at Discharge: Family;Available 24 hours/day Type of Home: House Home Access: Stairs to enter;Ramped entrance   Entrance Stairs-Number of Steps: 7   Home Layout: One level Home Equipment: Rollator (4 wheels);Cane - single point;Toilet riser      Prior Function Prior Level of Function : Independent/Modified Independent;Driving             Mobility Comments: PRN use of rollator in the community       Extremity/Trunk Assessment   Upper Extremity Assessment Upper Extremity Assessment: Overall WFL for tasks  assessed    Lower Extremity Assessment Lower Extremity Assessment: LLE deficits/detail LLE Deficits / Details: generalized post-op weakness and endurance deficits    Cervical / Trunk Assessment Cervical / Trunk Assessment: Normal  Communication   Communication Communication: No apparent difficulties Cueing Techniques: Verbal cues   Cognition Arousal: Alert Behavior During Therapy: WFL for tasks assessed/performed Overall Cognitive Status: Within Functional Limits for tasks assessed                                          General Comments General comments (skin integrity, edema, etc.): VSS on 4L [place]    Exercises     Assessment/Plan    PT Assessment Patient needs continued PT services  PT Problem List Decreased strength;Decreased activity tolerance;Decreased balance;Decreased mobility;Decreased knowledge of use of DME;Pain       PT Treatment Interventions DME instruction;Gait training;Stair training;Functional mobility training;Therapeutic activities;Therapeutic exercise;Balance training;Neuromuscular re-education;Patient/family education    PT Goals (Current goals can be found in the Care Plan section)  Acute Rehab PT Goals Patient Stated Goal: to go home PT Goal Formulation: With patient Time For Goal Achievement: 10/04/23 Potential to Achieve Goals: Good    Frequency Min 1X/week     Co-evaluation               AM-PAC PT "6 Clicks" Mobility  Outcome Measure Help needed turning from your back to your side while in a flat bed without using bedrails?: None Help needed moving from lying on your back to sitting on the side of a flat bed without using bedrails?: None Help needed moving to and from a bed to a chair (including a wheelchair)?: A Little Help needed standing up from a chair using your arms (e.g., wheelchair or bedside chair)?: A Little Help needed to walk in hospital room?: A Little Help needed climbing 3-5 steps with a railing? : A Little 6 Click Score: 20    End of Session   Activity Tolerance: Patient limited by fatigue Patient left: in bed;with call bell/phone within reach Nurse Communication: Mobility status PT Visit Diagnosis: Other abnormalities of gait and mobility (R26.89);Muscle weakness (generalized) (M62.81)    Time: 5643-3295 PT Time Calculation  (min) (ACUTE ONLY): 17 min   Charges:   PT Evaluation $PT Eval Low Complexity: 1 Low   PT General Charges $$ ACUTE PT VISIT: 1 Visit         Arlyss Gandy, PT, DPT Acute Rehabilitation Office [phone]   Arlyss Gandy 09/20/2023, 12:26 PM

## 2023-09-20 NOTE — Discharge Summary (Signed)
Physician Discharge Summary  [name]:[id number] DOB: 1945-12-03 DOA: 09/15/2023  PCP: Bradd Canary, MD  Admit date: 09/15/2023 Discharge date: 09/20/2023  Admitted From: Home Disposition: Home  Recommendations for Outpatient Follow-up:  Follow up with PCP in 1 week with repeat CBC/BMP Outpatient follow-up with podiatry.  Discharge wound care/pain management as per podiatry recommendations Outpatient follow-up with vascular surgery Follow up in ED if symptoms worsen or new appear   Home Health: Home health PT/RN  equipment/Devices: None  Discharge Condition: Stable CODE STATUS: Full Diet recommendation: Heart healthy/carb modified  Brief/Interim Summary: 77 y.o. male with medical history significant of hypertension, hyperlipidemia, carotid artery disease,  COPD on 4 L of oxygen at baseline, peripheral artery disease s/p left iliac stents, osteomyelitis s/p left hallux amputation 05/2019 ,hypothyroidism, AAA presented with pain and swelling of his left second toe with redness and drainage.  He was started on broad-spectrum antibiotics.  Workup revealed third toe cellulitis with osteomyelitis of the third middle and distal phalanges.  Podiatry and vascular surgery were consulted.  He underwent aortogram and left lower extremity angiogram on 09/18/2023 by podiatry.  He underwent left third toe amputation by podiatry on 09/19/2023.  Subsequently, podiatry has cleared him for discharge.  He will be discharged home today on oral doxycycline for 1 week as per podiatry recommendations.  Outpatient follow-up with PCP and podiatry.    Discharge Diagnoses:   Acute osteomyelitis of left third toe with cellulitis of the left foot/diabetic foot ulcer PAD -Treated with broad-spectrum antibiotics.  Podiatry and vascular surgery following.  -underwent electrogram and left lower extremity angiogram on 09/18/2023 by podiatry.   -underwent left third toe amputation by podiatry on 09/19/2023.    -Continue aspirin, Plavix and statin -Subsequently, podiatry has cleared him for discharge.  He will be discharged home today on oral doxycycline for 1 week as per podiatry recommendations.  Outpatient follow-up with PCP and podiatry.   Leukocytosis -Resolved   Diabetes mellitus type 2 with hyperglycemia -A1c 6.6.  Resume home regimen.  Outpatient follow-up with PCP.  Continue carb modified diet   Chronic respiratory failure with hypoxia COPD -Stable.  Currently on 2 L oxygen by nasal cannula. -Continue outpatient regimen  Hypertension Hyperlipidemia -Continue Coreg, losartan and statin   Generalized anxiety disorder -continue Prozac and Xanax as needed   Memory loss -Continue Namenda   Hypothyroidism -Continue Synthroid   Obesity -Outpatient follow-up   AAA without rupture -Outpatient follow-up with vascular surgery   Discharge Instructions  Discharge Instructions     Diet - low sodium heart healthy   Complete by: As directed    Discharge wound care:   Complete by: As directed    As per podiatry recommendations   Increase activity slowly   Complete by: As directed       Allergies as of 09/20/2023       Reactions   Metformin Diarrhea   Prednisone Itching        Medication List     TAKE these medications    Accu-Chek Aviva Plus test strip Generic drug: glucose blood Check blood sugars three times daily   Accu-Chek Aviva Plus w/Device Kit Check blood sugars three times daily   accu-chek multiclix lancets Check blood sugars three times daily   albuterol (2.5 MG/3ML) 0.083% nebulizer solution Commonly known as: PROVENTIL Take 3 mLs (2.5 mg total) by nebulization every 6 (six) hours as needed for wheezing or shortness of breath.   ALPRAZolam 0.5 MG tablet Commonly known  as: XANAX Take 1 tablet (0.5 mg total) by mouth 2 (two) times daily as needed for anxiety.   arformoterol 15 MCG/2ML Nebu Commonly known as: BROVANA Take 2 mLs (15 mcg  total) by nebulization daily.   aspirin EC 81 MG tablet Take 81 mg by mouth at bedtime.   atorvastatin 80 MG tablet Commonly known as: LIPITOR TAKE 1 TABLET BY MOUTH EVERY DAY   BD Pen Needle Nano 2nd Gen 32G X 4 MM Misc Generic drug: Insulin Pen Needle USE AS DIRECTED WITH HUMALOG PEN   BIOTIN PO Take 1 capsule by mouth daily.   budesonide 0.25 MG/2ML nebulizer solution Commonly known as: Pulmicort Take 2 mLs (0.25 mg total) by nebulization daily.   carvedilol 25 MG tablet Commonly known as: COREG TAKE ONE-HALF TABLET BY  MOUTH TWICE DAILY WITH  MEALS   cholecalciferol 1000 units tablet Commonly known as: VITAMIN D Take 1,000 Units by mouth daily.   clopidogrel 75 MG tablet Commonly known as: PLAVIX TAKE 1 TABLET BY MOUTH EVERY DAY WITH BREAKFAST What changed: See the new instructions.   cyanocobalamin 1000 MCG tablet Commonly known as: VITAMIN B12 Take 1,000 mcg by mouth See admin instructions. Take one tablet by mouth twice weekly on Monday and Thursday per wife   doxycycline 100 MG capsule Commonly known as: VIBRAMYCIN Take 1 capsule (100 mg total) by mouth 2 (two) times daily for 7 days.   ezetimibe 10 MG tablet Commonly known as: Zetia Take 1 tablet (10 mg total) by mouth daily.   Fish Oil 1000 MG Caps Take 1,000 mg by mouth 2 (two) times daily.   FLUoxetine 40 MG capsule Commonly known as: PROZAC Take 1 capsule (40 mg total) by mouth daily.   glimepiride 2 MG tablet Commonly known as: AMARYL Take 1 tablet (2 mg total) by mouth 2 (two) times daily.   insulin lispro 100 UNIT/ML KwikPen Commonly known as: HumaLOG KwikPen Before each meal 3 times a day, 140-199 - 3 units, 200-250 - 6 units, 251-299 - 8 units,  300-349 - 10 units,  350 or above 12 units. Insulin PEN if approved, provide syringes and needles if needed.   Latanoprost 0.005 % Emul Place 1 drop into both eyes in the morning and at bedtime.   levothyroxine 25 MCG tablet Commonly known as:  SYNTHROID TAKE 1 TABLET BY MOUTH EVERY DAY   losartan 50 MG tablet Commonly known as: COZAAR TAKE 1 TABLET BY MOUTH EVERY DAY   Magnesium 500 MG Tabs Take 500 mg by mouth daily.   memantine 5 MG tablet Commonly known as: NAMENDA Take 1 tablet (5 mg total) by mouth 2 (two) times daily.   methocarbamol 500 MG tablet Commonly known as: ROBAXIN Take 1 tablet (500 mg total) by mouth every 6 (six) hours as needed for muscle spasms.   multivitamin tablet Take 1 tablet by mouth daily.   pioglitazone 30 MG tablet Commonly known as: ACTOS TAKE 1 TABLET BY MOUTH EVERY DAY   pregabalin 150 MG capsule Commonly known as: LYRICA TAKE 1 CAPSULE BY MOUTH TWICE A DAY   vitamin C 1000 MG tablet Take 1,000 mg by mouth daily.   zinc gluconate 50 MG tablet Take 50 mg by mouth daily.               Discharge Care Instructions  (From admission, onward)           Start     Ordered   09/20/23 0000  Discharge wound  care:       Comments: As per podiatry recommendations   09/20/23 0931            Follow-up Information     Bradd Canary, MD Follow up.   Specialty: Family Medicine Contact information: [address] [place] [address] [phone]         [facility] Follow up.   Specialty: Home Health Services Why: home health services will be provided by [facility] Contact information: [address] [place] [address] [phone]         Edwin Cap, DPM. Schedule an appointment as soon as possible for a visit in 1 week(s).   Specialty: Podiatry Contact information: [address] [place] [address] [phone]                Allergies  Allergen Reactions   Metformin Diarrhea   Prednisone Itching    Consultations: Podiatry/vascular surgery    Procedures/Studies: PERIPHERAL VASCULAR CATHETERIZATION  Result Date: 09/18/2023 Images from the original result were not  included. Patient name: [name] MRN: [id number] DOB: 10-15-1946 Sex: male 09/18/2023 Pre-operative Diagnosis: Left lower extremity critical limb ischemia with tissue loss at the digits and forefoot Post-operative diagnosis:  Same Surgeon:  Victorino Sparrow, MD Procedure Performed: 1.  Ultrasound-guided micropuncture access of the right common femoral artery in retrograde fashion 2.  Aortogram 3.  Second-order cannulation, left lower extremity angiogram 4.  Third order cannulation, left lower extremity angiogram 5.  Balloon lithotripsy 4.5 x 60 mm P1 segment of the popliteal artery 6.  Device assisted closure-Mynx 7.  Contrast volume 105 mL, sedation time 47 minutes Indications:  Pt with critical limb ischemia with tissue loss requiring TMA. Nonpalpable pulses. After discussing angiogram to define and hopefullly improve perfusion for wound healing, Ritchard elected to proceed. Findings: Bilateral renal arteries patent Infrarenal abdominal aneurysm Bilateral common iliac artery stents patent without stenosis, bilateral hypogastric arteries patent, bilateral external iliac arteries patent On the left: Common femoral artery patent, profunda patent.  Superficial femoral artery with moderate disease, the distal aspect had 2 lesions with 50% stenosis.  Femur hardware and fibular hardware made imaging difficult however, the popliteal artery with 70% stenosis in the P2 segment, no stenosis appreciated in P3.  Severely calcified Three-vessel runoff to the foot, posterior tibial artery dominant the dorsalis pedis and plantar arteries are patent.  Small vessel disease of the level of the toes.  Procedure:  The patient was identified in the holding area and taken to room 8.  The patient was then placed supine on the table and prepped and draped in the usual sterile fashion.  A time out was called.  Ultrasound was used to evaluate the right common femoral artery.  It was patent .  A digital ultrasound image was acquired.  A  micropuncture needle was used to access the right common femoral artery under ultrasound guidance.  An 018 wire was advanced without resistance and a micropuncture sheath was placed.  The 018 wire was removed and a benson wire was placed.  The micropuncture sheath was exchanged for a 5 french sheath.  An omniflush catheter was advanced over the wire to the level of L-1.  An abdominal angiogram was obtained.  Next, using the omniflush catheter and a benson wire, the aortic bifurcation was crossed and the catheter was placed into theleft external iliac artery and left runoff was obtained. I elected to  attempt intervention on the left P1 segment of the popliteal artery.  A 6 x 65 cm sheath was parked in the superficial femoral artery and the patient was heparinized.  Next, a series of wires and catheters were used to cross the lesion.  Multiple projections were taken due to the significant metal hardware within the left leg there appeared to be 70% stenosis that was eccentric.  I did not want to dissected this lesion, therefore I elected to use balloon lithotripsy.  A 4.5 x 60 mm lithotripsy balloon was brought onto the field and placed across the lesion.  This was inflated to 2 atm and 2, 30 pulse runs followed.  Follow-up angiography demonstrated significant improvement to 30% stenosis.  I was happy with this as the lesion had improved, and there was no dissection.  I did not want to place a stent as this was in the popliteal fossa. Right groin arteriotomy managed with a minx device without issue Impression: Successful balloon lithotripsy to the P2 segment of the popliteal artery resulting in less than 30% residual stenosis. Victorino Sparrow MD [facility] Office: [phone]   MR FOOT LEFT WO CONTRAST  Result Date: 09/16/2023 CLINICAL DATA:  Left foot infection. EXAM: MRI OF THE LEFT FOOT WITHOUT CONTRAST TECHNIQUE: Multiplanar, multisequence MR imaging of the left forefoot was  performed. No intravenous contrast was administered. COMPARISON:  Left foot x-rays from same day. MRI left foot dated May 26, 2019. FINDINGS: Bones/Joint/Cartilage Abnormal marrow edema involving the third middle and distal phalanges. Prior first ray amputation. Chronic deformities of the distal second and third metatarsals, as well as the third proximal phalanx. Chronic deformity of the navicular with ankylosis of the middle and lateral cuneiforms with the navicular. Moderate talonavicular and calcaneocuboid osteoarthritis. Postsurgical changes and hardware artifact in the tibial plafond and distal fibula chronic bone infarct in the distal tibia. No joint effusion. Ligaments Second through fifth toe collateral ligaments are intact. Muscles and Tendons Postsurgical changes of the first flexor and extensor tendons. No tenosynovitis. Near complete fatty atrophy of the intrinsic foot muscles. Soft tissue Prominent soft tissue swelling of the third toe with ulceration at the tip. Mild soft tissue swelling of the dorsal foot. No focal fluid collection. Possible ulceration of the dorsal tip of the second toe. No soft tissue mass. IMPRESSION: 1. Third toe cellulitis with ulceration at the tip and osteomyelitis of the third middle and distal phalanges. No abscess. 2. Possible ulceration of the dorsal tip of the second toe. Correlate with physical exam. Electronically Signed   By: Obie Dredge M.D.   On: 09/16/2023 11:20   VAS [name] [name] WITH/WO TBI  Result Date: 09/15/2023  LOWER EXTREMITY DOPPLER STUDY Patient Name:  [name]  Date of Exam:   09/15/2023 Medical Rec #: [id number]      Accession #:    [id number] Date of Birth: [birth year]     Patient Gender: M Patient Age:   33 years Exam [facility]  [facility] Procedure:      VAS [name] [name] WITH/WO TBI Referring Phys: Carlena Hurl --------------------------------------------------------------------------------  Indications: Peripheral artery disease. DM  foot infection High Risk Factors: Hypertension, hyperlipidemia, Diabetes, coronary artery                    disease, prior CVA.  Vascular Interventions: Bilateral kissing common iliac artery stent placement  05/28/2019, Left great toe amputation 05/29/19. Comparison Study: Previous exam 09/04/2022 Performing Technologist: Ernestene Mention RVT/RDMS  Examination Guidelines: A complete evaluation includes at minimum, Doppler waveform signals and systolic blood pressure reading at the level of bilateral brachial, anterior tibial, and posterior tibial arteries, when vessel segments are accessible. Bilateral testing is considered an integral part of a complete examination. Photoelectric Plethysmograph (PPG) waveforms and toe systolic pressure readings are included as required and additional duplex testing as needed. Limited examinations for reoccurring indications may be performed as noted.  [name] Findings: +---------+------------------+-----+---------+--------+ Right    Rt Pressure (mmHg)IndexWaveform Comment  +---------+------------------+-----+---------+--------+ Brachial 113                    triphasic         +---------+------------------+-----+---------+--------+ PTA      116               1.02 biphasic          +---------+------------------+-----+---------+--------+ DP       94                0.82 biphasic          +---------+------------------+-----+---------+--------+ Great Toe46                0.40 Abnormal          +---------+------------------+-----+---------+--------+ +---------+------------------+-----+----------+----------+ Left     Lt Pressure (mmHg)IndexWaveform  Comment    +---------+------------------+-----+----------+----------+ Brachial 114                    triphasic            +---------+------------------+-----+----------+----------+ PTA      89                0.78 monophasic            +---------+------------------+-----+----------+----------+ DP       86                0.75 monophasic           +---------+------------------+-----+----------+----------+ Great Toe                                 amputation +---------+------------------+-----+----------+----------+ +-------+-----------+-----------+------------+------------+ [name]/TBIToday's ABIToday's TBIPrevious ABIPrevious TBI +-------+-----------+-----------+------------+------------+ Right  1.02       0.40       1.03        0.66         +-------+-----------+-----------+------------+------------+ Left   0.78       amp        0.99        0.63         +-------+-----------+-----------+------------+------------+  Summary: Right: Resting right ankle-brachial index is within normal range. The right toe-brachial index is abnormal. Left: Resting left ankle-brachial index indicates moderate left lower extremity arterial disease. *See table(s) above for measurements and observations.  Electronically signed by Carolynn Sayers on 09/15/2023 at 4:07:58 PM.    Final    VAS [name] LOWER EXTREMITY VENOUS (DVT) (ONLY [facility] & [facility])  Result Date: 09/15/2023  Lower Venous DVT Study Patient Name:  [name]  Date of Exam:   09/15/2023 Medical Rec #: [id number]      Accession #:    [id number] Date of Birth: [birth year]     Patient Gender: M Patient Age:   42 years Exam Location:  [facility] Procedure:      VAS [name] LOWER EXTREMITY  VENOUS (DVT) Referring Phys: ROBERT PATERSON --------------------------------------------------------------------------------  Indications: Swelling. Other Indications: DM foot infection. Comparison Study: Previous exam on 09/07/2021 was negative for DVT Performing Technologist: Ernestene Mention RVT, RDMS  Examination Guidelines: A complete evaluation includes B-mode imaging, spectral Doppler, color Doppler, and power Doppler as needed of all accessible portions of each vessel. Bilateral testing is considered an  integral part of a complete examination. Limited examinations for reoccurring indications may be performed as noted. The reflux portion of the exam is performed with the patient in reverse Trendelenburg.  +-----+---------------+---------+-----------+----------+--------------+ RIGHTCompressibilityPhasicitySpontaneityPropertiesThrombus Aging +-----+---------------+---------+-----------+----------+--------------+ CFV  Full           Yes      Yes                                 +-----+---------------+---------+-----------+----------+--------------+   +---------+---------------+---------+-----------+----------+-------------------+ LEFT     CompressibilityPhasicitySpontaneityPropertiesThrombus Aging      +---------+---------------+---------+-----------+----------+-------------------+ CFV      Full           Yes      Yes                                      +---------+---------------+---------+-----------+----------+-------------------+ SFJ      Full                                                             +---------+---------------+---------+-----------+----------+-------------------+ FV Prox  Full           Yes      Yes                                      +---------+---------------+---------+-----------+----------+-------------------+ FV Mid   Full           Yes      Yes                                      +---------+---------------+---------+-----------+----------+-------------------+ FV DistalFull           Yes      Yes                                      +---------+---------------+---------+-----------+----------+-------------------+ PFV      Full                                                             +---------+---------------+---------+-----------+----------+-------------------+ POP      Full           Yes      Yes                                      +---------+---------------+---------+-----------+----------+-------------------+  PTV       Full                                                             +---------+---------------+---------+-----------+----------+-------------------+ PERO     Full                                         Not well visualized +---------+---------------+---------+-----------+----------+-------------------+    Summary: RIGHT: - No evidence of common femoral vein obstruction.   LEFT: - There is no evidence of deep vein thrombosis in the lower extremity.  - No cystic structure found in the popliteal fossa. - Ultrasound characteristics of enlarged lymph nodes noted in the groin. Subcutaneous edema extending from knee to ankle.  *See table(s) above for measurements and observations. Electronically signed by Carolynn Sayers on 09/15/2023 at 4:07:45 PM.    Final    DG Foot 2 Views Left  Result Date: 09/15/2023 CLINICAL DATA:  Osteomyelitis. EXAM: LEFT FOOT - 2 VIEW COMPARISON:  January 01, 2023. FINDINGS: Status post surgical amputation distal portion first metatarsal and phalanges. Old healed distal second metatarsal fracture is noted. No acute fracture or dislocation is noted. No definite lytic destruction is seen at this time. Postsurgical changes are noted in the distal left tibia and fibula. Degenerative changes are seen involving intertarsal and metatarsophalangeal joints. IMPRESSION: Chronic findings as noted above. No definite acute abnormality seen. Electronically Signed   By: Lupita Raider M.D.   On: 09/15/2023 12:59   VAS US CAROTID  Result Date: 08/23/2023 Carotid Arterial Duplex Study Patient Name:  [name]  Date of Exam:   08/23/2023 Medical Rec #: [id number]      Accession #:    [id number] Date of Birth: 1946-08-05     Patient Gender: M Patient Age:   84 years Exam Location:  [facility] Procedure:      VAS US CAROTID Referring Phys: Christiane Ha BERRY --------------------------------------------------------------------------------  Indications:  Carotid artery disease and bilateral  endarterectomies. Risk Factors: Hypertension, hyperlipidemia, Diabetes, past history of smoking,               PAD. Performing Technologist: Jake Seats RDMS, RVT, RDCS  Examination Guidelines: A complete evaluation includes B-mode imaging, spectral Doppler, color Doppler, and power Doppler as needed of all accessible portions of each vessel. Bilateral testing is considered an integral part of a complete examination. Limited examinations for reoccurring indications may be performed as noted.  Right Carotid Findings: +----------+--------+--------+--------+------------------+------------------+           PSV cm/sEDV cm/sStenosisPlaque DescriptionComments           +----------+--------+--------+--------+------------------+------------------+ CCA Prox  92      17                                                   +----------+--------+--------+--------+------------------+------------------+ CCA Distal24      5               heterogenous      intimal thickening +----------+--------+--------+--------+------------------+------------------+ ICA Prox  28  7               heterogenous                         +----------+--------+--------+--------+------------------+------------------+ ICA Mid   58      18      1-39%                                        +----------+--------+--------+--------+------------------+------------------+ ICA Distal79      22                                                   +----------+--------+--------+--------+------------------+------------------+ ECA       73      8                                                    +----------+--------+--------+--------+------------------+------------------+ +----------+--------+-------+----------------+-------------------+           PSV cm/sEDV cmsDescribe        Arm Pressure (mmHG) +----------+--------+-------+----------------+-------------------+ [id number]     0      Multiphasic, [id number]                  +----------+--------+-------+----------------+-------------------+ +---------+--------+--------+---------------+ VertebralPSV cm/sEDV cm/sBi- directional +---------+--------+--------+---------------+  Left Carotid Findings: +----------+-------+--------+--------+-----------------------+-----------------+           PSV    EDV cm/sStenosisPlaque Description     Comments                    cm/s                                                            +----------+-------+--------+--------+-----------------------+-----------------+ CCA Prox  83     12                                                       +----------+-------+--------+--------+-----------------------+-----------------+ CCA Mid                          calcific and                                                              heterogenous                             +----------+-------+--------+--------+-----------------------+-----------------+ CCA Distal67     9               heterogenous  intimal                                                                   thickening        +----------+-------+--------+--------+-----------------------+-----------------+ ICA Prox  52     10              heterogenous and                                                          calcific                                 +----------+-------+--------+--------+-----------------------+-----------------+ ICA Mid   75     18      1-39%                                            +----------+-------+--------+--------+-----------------------+-----------------+ ICA Distal81     16                                                       +----------+-------+--------+--------+-----------------------+-----------------+ ECA       162    12                                                       +----------+-------+--------+--------+-----------------------+-----------------+  +----------+--------+--------+----------------+-------------------+           PSV cm/sEDV cm/sDescribe        Arm Pressure (mmHG) +----------+--------+--------+----------------+-------------------+ [id number]     0       Multiphasic, [id number]                 +----------+--------+--------+----------------+-------------------+ +---------+--------+--+--------+--+---------+ VertebralPSV cm/s64EDV cm/s11Antegrade +---------+--------+--+--------+--+---------+   Summary: Right Carotid: Velocities in the right ICA are consistent with a 1-39% stenosis.                Non-hemodynamically significant plaque <50% noted in the CCA.                Patent right endarterectomy site. Left Carotid: Velocities in the left ICA are consistent with a 1-39% stenosis.               Non-hemodynamically significant plaque <50% noted in the CCA.               Patent left endarterectomy site. Vertebrals:  Left vertebral artery demonstrates antegrade flow. Right vertebral              artery demonstrates bidirectional flow. Subclavians: Normal flow hemodynamics were seen in bilateral subclavian  arteries. *See table(s) above for measurements and observations.  Electronically signed by Charlton Haws MD on 08/23/2023 at 2:56:07 PM.    Final       Subjective: Patient seen and examined at bedside.  No fever, vomiting, chest pain reported.  Complains of neck pain and spasm.  Discharge Exam: Vitals:   09/20/23 0309 09/20/23 0753  BP: (!) 144/63   Pulse: 60 62  Resp: 20 18  Temp: 97.8 F (36.6 C)   SpO2: 100% 99%    General: On 2 L oxygen via nasal cannula.  No acute distress.  respiratory: Bilateral decreased breath sounds at bases bilaterally with scattered crackles CVS: Currently rate controlled; S1-S2 heard  abdominal: Soft, nontender, distended mildly, no organomegaly; bowel sounds are normally heard  extremities: No clubbing; mild lower extremity edema present.  Left foot dressing  present    The results of significant diagnostics from this hospitalization (including imaging, microbiology, ancillary and laboratory) are listed below for reference.     Microbiology: Recent Results (from the past 240 hour(s))  Blood culture (routine x 2)     Status: None   Collection Time: 09/15/23 11:03 AM   Specimen: BLOOD  Result Value Ref Range Status   Specimen Description BLOOD LEFT ANTECUBITAL  Final   Special Requests   Final    BOTTLES DRAWN AEROBIC AND ANAEROBIC Blood Culture adequate volume   Culture   Final    NO GROWTH 5 DAYS Performed at [facility], [address], [place], [place] [address]    Report Status 09/20/2023 FINAL  Final  Blood culture (routine x 2)     Status: None   Collection Time: 09/15/23 11:08 AM   Specimen: BLOOD  Result Value Ref Range Status   Specimen Description BLOOD RIGHT ANTECUBITAL  Final   Special Requests   Final    BOTTLES DRAWN AEROBIC AND ANAEROBIC Blood Culture adequate volume   Culture   Final    NO GROWTH 5 DAYS Performed at [facility], [address] [place], [place] [address]    Report Status 09/20/2023 FINAL  Final  Aerobic/Anaerobic Culture w Gram Stain (surgical/deep wound)     Status: None (Preliminary result)   Collection Time: 09/19/23  6:12 PM   Specimen: Wound  Result Value Ref Range Status   Specimen Description WOUND LEFT FOOT  Final   Special Requests DEEP  Final   Gram Stain   Final    NO WBC SEEN NO ORGANISMS SEEN Performed at [facility], [address] [place], [place] [address]    Culture PENDING  Incomplete   Report Status PENDING  Incomplete  Aerobic/Anaerobic Culture w Gram Stain (surgical/deep wound)     Status: None (Preliminary result)   Collection Time: 09/19/23  6:15 PM   Specimen: Bone; Tissue  Result Value Ref Range Status   Specimen Description BONE  Final   Special Requests LEFT 3RD TOE  Final   Gram Stain   Final    NO WBC SEEN NO ORGANISMS SEEN Performed  at [facility], [address] [name], [place] [address]    Culture PENDING  Incomplete   Report Status PENDING  Incomplete     Labs: BNP (last 3 results) No results for input(s): "BNP" in the last 8760 hours. Basic Metabolic Panel: Recent Labs  Lab 09/15/23 1007 09/16/23 0732 09/18/23 1111 09/19/23 0528 09/20/23 0538  NA 137 140 139 134* 137  K 4.6 4.7 4.3  3.9 3.8  CL 100 103 100 99 104  CO2 26 26 30 27 26   GLUCOSE 142* 165* 139* 126* 129*  BUN 15 15 15 20 17   CREATININE 0.89 0.82 0.82 0.84 0.92  CALCIUM 9.0 9.2 9.0 8.3* 8.5*  MG  --   --   --  1.8 1.9   Liver Function Tests: Recent Labs  Lab 09/15/23 1007  AST 17  ALT 18  ALKPHOS 69  BILITOT 0.7  PROT 6.5  ALBUMIN 3.2*   No results for input(s): "LIPASE", "AMYLASE" in the last 168 hours. No results for input(s): "AMMONIA" in the last 168 hours. CBC: Recent Labs  Lab 09/15/23 1007 09/16/23 0732 09/18/23 2000 09/19/23 0528 09/20/23 0538  WBC 11.8* 9.7 8.5 7.8 8.1  NEUTROABS 9.0*  --   --  5.1 5.3  HGB 13.6 13.7 12.7* 12.5* 12.1*  HCT 42.1 43.0 39.7 39.7 37.7*  MCV 99.5 99.5 100.5* 99.7 99.2  PLT 209 222 212 193 193   Cardiac Enzymes: No results for input(s): "CKTOTAL", "CKMB", "CKMBINDEX", "TROPONINI" in the last 168 hours. BNP: Invalid input(s): "POCBNP" CBG: Recent Labs  Lab 09/19/23 1154 09/19/23 1617 09/19/23 1830 09/19/23 2119 09/20/23 0713  GLUCAP 165* 104* 127* 222* 125*   D-Dimer No results for input(s): "DDIMER" in the last 72 hours. Hgb A1c No results for input(s): "HGBA1C" in the last 72 hours. Lipid Profile Recent Labs    09/19/23 0528  CHOL 122  HDL 28*  LDLCALC 73  TRIG 161  CHOLHDL 4.4   Thyroid function studies No results for input(s): "TSH", "T4TOTAL", "T3FREE", "THYROIDAB" in the last 72 hours.  Invalid input(s): "FREET3" Anemia work up No results for input(s): "VITAMINB12", "FOLATE", "FERRITIN", "TIBC", "IRON", "RETICCTPCT" in the last 72  hours. Urinalysis    Component Value Date/Time   COLORURINE YELLOW 09/15/2023 1350   APPEARANCEUR CLEAR 09/15/2023 1350   LABSPEC 1.019 09/15/2023 1350   PHURINE 5.0 09/15/2023 1350   GLUCOSEU NEGATIVE 09/15/2023 1350   HGBUR NEGATIVE 09/15/2023 1350   BILIRUBINUR NEGATIVE 09/15/2023 1350   BILIRUBINUR Small 07/15/2015 1103   KETONESUR NEGATIVE 09/15/2023 1350   PROTEINUR NEGATIVE 09/15/2023 1350   UROBILINOGEN 0.2 07/15/2015 1103   NITRITE NEGATIVE 09/15/2023 1350   LEUKOCYTESUR NEGATIVE 09/15/2023 1350   Sepsis Labs Recent Labs  Lab 09/16/23 0732 09/18/23 2000 09/19/23 0528 09/20/23 0538  WBC 9.7 8.5 7.8 8.1   Microbiology Recent Results (from the past 240 hour(s))  Blood culture (routine x 2)     Status: None   Collection Time: 09/15/23 11:03 AM   Specimen: BLOOD  Result Value Ref Range Status   Specimen Description BLOOD LEFT ANTECUBITAL  Final   Special Requests   Final    BOTTLES DRAWN AEROBIC AND ANAEROBIC Blood Culture adequate volume   Culture   Final    NO GROWTH 5 DAYS Performed at [facility], [address], [place], [place] [address]    Report Status 09/20/2023 FINAL  Final  Blood culture (routine x 2)     Status: None   Collection Time: 09/15/23 11:08 AM   Specimen: BLOOD  Result Value Ref Range Status   Specimen Description BLOOD RIGHT ANTECUBITAL  Final   Special Requests   Final    BOTTLES DRAWN AEROBIC AND ANAEROBIC Blood Culture adequate volume   Culture   Final    NO GROWTH 5 DAYS Performed at [facility], [address], [place], [place] [address]    Report Status  09/20/2023 FINAL  Final  Aerobic/Anaerobic Culture w Gram Stain (surgical/deep wound)     Status: None (Preliminary result)   Collection Time: 09/19/23  6:12 PM   Specimen: Wound  Result Value Ref Range Status   Specimen Description WOUND LEFT FOOT  Final   Special Requests DEEP  Final   Gram Stain   Final    NO WBC SEEN NO ORGANISMS SEEN Performed at [facility], [address] [place], [place] [address]    Culture PENDING  Incomplete   Report Status PENDING  Incomplete  Aerobic/Anaerobic Culture w Gram Stain (surgical/deep wound)     Status: None (Preliminary result)   Collection Time: 09/19/23  6:15 PM   Specimen: Bone; Tissue  Result Value Ref Range Status   Specimen Description BONE  Final   Special Requests LEFT 3RD TOE  Final   Gram Stain   Final    NO WBC SEEN NO ORGANISMS SEEN Performed at [facility], [address] [place], [place] [address]    Culture PENDING  Incomplete   Report Status PENDING  Incomplete     Time coordinating discharge: 35 minutes  SIGNED:   Glade Lloyd, MD  [facility] 09/20/2023, 9:31 AM

## 2023-09-20 NOTE — Progress Notes (Signed)
  Subjective:  Patient ID: [name], male    DOB: August 30, 1946,  MRN: [id number]  Patient seen at bedside resting comfortably not having any pain his wife is present. N issues overnight   Negative for chest pain and shortness of breath Fever: no Night sweats: no Constitutional signs: no Objective:   Vitals:   09/20/23 0309 09/20/23 0753  BP: (!) 144/63   Pulse: 60 62  Resp: 20 18  Temp: 97.8 F (36.6 C)   SpO2: 100% 99%   General AA&O x3. Normal mood and affect.  Vascular Left foot nonpalpable pulses, mild edema  Neurologic Epicritic sensation grossly absent.  Dermatologic Incision well perfused. No infection. Minimal bleeding  Orthopedic: MMT 5/5 in dorsiflexion, plantarflexion, inversion, and eversion. Normal joint ROM without pain or crepitus.   Angiography with balloon lithotripsy to popliteal artery with reduction from 70% to 30% stenosis  Assessment & Plan:  Patient was evaluated and treated and all questions answered.  PAD, osteomyelitis left third toe -OK to discharge from my standpoint -WBAT in post op shoe  -7 Days doxy 100mg  BID. NGTD on cultures, will tailor pending finalization -F/U with me in 1 week. Will need 2x weekly home dressing changes, his wife is unable to do this due to neck issue right now  Edwin Cap, DPM  Accessible via secure chat for questions or concerns.

## 2023-09-20 NOTE — Plan of Care (Signed)
  Problem: Education: Goal: Ability to describe self-care measures that may prevent or decrease complications (Diabetes Survival Skills Education) will improve Outcome: Adequate for Discharge Goal: Individualized Educational Video(s) Outcome: Adequate for Discharge   Problem: Coping: Goal: Ability to adjust to condition or change in health will improve Outcome: Adequate for Discharge   Problem: Fluid Volume: Goal: Ability to maintain a balanced intake and output will improve Outcome: Adequate for Discharge   Problem: Health Behavior/Discharge Planning: Goal: Ability to identify and utilize available resources and services will improve Outcome: Adequate for Discharge Goal: Ability to manage health-related needs will improve Outcome: Adequate for Discharge   Problem: Metabolic: Goal: Ability to maintain appropriate glucose levels will improve Outcome: Adequate for Discharge   Problem: Nutritional: Goal: Maintenance of adequate nutrition will improve Outcome: Adequate for Discharge Goal: Progress toward achieving an optimal weight will improve Outcome: Adequate for Discharge   Problem: Skin Integrity: Goal: Risk for impaired skin integrity will decrease Outcome: Adequate for Discharge   Problem: Tissue Perfusion: Goal: Adequacy of tissue perfusion will improve Outcome: Adequate for Discharge   Problem: Education: Goal: Knowledge of General Education information will improve Description: Including pain rating scale, medication(s)/side effects and non-pharmacologic comfort measures Outcome: Adequate for Discharge   Problem: Clinical Measurements: Goal: Ability to maintain clinical measurements within normal limits will improve Outcome: Adequate for Discharge Goal: Will remain free from infection Outcome: Adequate for Discharge Goal: Diagnostic test results will improve Outcome: Adequate for Discharge Goal: Respiratory complications will improve Outcome: Adequate for  Discharge Goal: Cardiovascular complication will be avoided Outcome: Adequate for Discharge   Problem: Activity: Goal: Risk for activity intolerance will decrease Outcome: Adequate for Discharge   Problem: Nutrition: Goal: Adequate nutrition will be maintained Outcome: Adequate for Discharge   Problem: Coping: Goal: Level of anxiety will decrease Outcome: Adequate for Discharge   Problem: Elimination: Goal: Will not experience complications related to bowel motility Outcome: Adequate for Discharge Goal: Will not experience complications related to urinary retention Outcome: Adequate for Discharge   Problem: Pain Management: Goal: General experience of comfort will improve Outcome: Adequate for Discharge   Problem: Safety: Goal: Ability to remain free from injury will improve Outcome: Adequate for Discharge   Problem: Skin Integrity: Goal: Risk for impaired skin integrity will decrease Outcome: Adequate for Discharge   Problem: Education: Goal: Understanding of CV disease, CV risk reduction, and recovery process will improve Outcome: Adequate for Discharge Goal: Individualized Educational Video(s) Outcome: Adequate for Discharge   Problem: Activity: Goal: Ability to return to baseline activity level will improve Outcome: Adequate for Discharge   Problem: Cardiovascular: Goal: Ability to achieve and maintain adequate cardiovascular perfusion will improve Outcome: Adequate for Discharge Goal: Vascular access site(s) Level 0-1 will be maintained Outcome: Adequate for Discharge   Problem: Health Behavior/Discharge Planning: Goal: Ability to safely manage health-related needs after discharge will improve Outcome: Adequate for Discharge    Pt & family stated the pt only uses oxygen at home prn & he would be okay to travel home without his oxygen. Per pt & wife he only needs the oxygen when he's up moving around & wife unwilling to go home & come back with it as he only  needs it prn.

## 2023-09-20 NOTE — Discharge Instructions (Signed)
Dressing should be changed 2x weekly with non adherent, dry sterile gauze, kerlix, ACE with no compression

## 2023-09-21 ENCOUNTER — Encounter: Payer: Self-pay | Admitting: Family Medicine

## 2023-09-23 ENCOUNTER — Other Ambulatory Visit (HOSPITAL_BASED_OUTPATIENT_CLINIC_OR_DEPARTMENT_OTHER): Payer: Self-pay | Admitting: Cardiovascular Disease

## 2023-09-23 ENCOUNTER — Telehealth: Payer: Self-pay | Admitting: *Deleted

## 2023-09-23 DIAGNOSIS — Z89422 Acquired absence of other left toe(s): Secondary | ICD-10-CM | POA: Diagnosis not present

## 2023-09-23 DIAGNOSIS — E1142 Type 2 diabetes mellitus with diabetic polyneuropathy: Secondary | ICD-10-CM | POA: Diagnosis not present

## 2023-09-23 DIAGNOSIS — J9611 Chronic respiratory failure with hypoxia: Secondary | ICD-10-CM | POA: Diagnosis not present

## 2023-09-23 DIAGNOSIS — E1151 Type 2 diabetes mellitus with diabetic peripheral angiopathy without gangrene: Secondary | ICD-10-CM | POA: Diagnosis not present

## 2023-09-23 DIAGNOSIS — Z4781 Encounter for orthopedic aftercare following surgical amputation: Secondary | ICD-10-CM | POA: Diagnosis not present

## 2023-09-23 DIAGNOSIS — I6523 Occlusion and stenosis of bilateral carotid arteries: Secondary | ICD-10-CM

## 2023-09-23 DIAGNOSIS — E1165 Type 2 diabetes mellitus with hyperglycemia: Secondary | ICD-10-CM | POA: Diagnosis not present

## 2023-09-23 DIAGNOSIS — M869 Osteomyelitis, unspecified: Secondary | ICD-10-CM | POA: Diagnosis not present

## 2023-09-23 DIAGNOSIS — E785 Hyperlipidemia, unspecified: Secondary | ICD-10-CM | POA: Diagnosis not present

## 2023-09-23 DIAGNOSIS — E1169 Type 2 diabetes mellitus with other specified complication: Secondary | ICD-10-CM | POA: Diagnosis not present

## 2023-09-23 LAB — SURGICAL PATHOLOGY

## 2023-09-23 MED ORDER — ACCU-CHEK GUIDE TEST VI STRP: ORAL_STRIP | 12 refills | Status: AC

## 2023-09-23 NOTE — Transitions of Care (Post Inpatient/ED Visit) (Signed)
09/23/2023  Name: [name] MRN: [id number] DOB: [birth year]  Today's TOC FU Call Status: Today's TOC FU Call Status:: Successful TOC FU Call Completed TOC FU Call Complete Date: 09/23/23 Patient's Name and Date of Birth confirmed.  Transition Care Management Follow-up Telephone Call Date of Discharge: 09/20/23 Discharge Facility: [facility]) Type of Discharge: Inpatient Admission Primary Inpatient Discharge Diagnosis:: (L) foot cellulitis with osteomyelitis/ surgical amputation of 3rd toe How have you been since you were released from the hospital?: Better ("I am doing okay.  My wife is not here for you to review the medications.  Just schedule me with Dr. covering for my PCP and I'll check with my wife when she gets home- we will re-schedule it if we need to") Any questions or concerns?: No  1:45 pm:  Contacted patient's spouse per patient request to hopefully review medications/ confirm scheduled PCP appointment:  wife confirms she is not currently at home; states that she has no medication concerns; declines medication review as she is not currently at home and is driving; she states she is not sure if they will be able to attend the appointment I facilitated scheduling for HFU with PCP-- she declines my reaching out to find alternative appointments- states she will call office herself to re-schedule if they are unable to attend the appointment that was scheduled with patient  Items Reviewed: Did you receive and understand the discharge instructions provided?: Yes (thoroughly reviewed with patient who verbalizes good understanding of same) Medications obtained,verified, and reconciled?: Partial Review Completed Reason for Partial Mediation Review: Partial medication reconciliation/ review completed; no concerns or discrepancies identified; confirmed patient obtained/ is taking all newly Rx'd medications as instructed; spouse-manages medications and both patient/ and later spouse  denies questions/ concerns around medications today Any new allergies since your discharge?: No Dietary orders reviewed?: Yes Type of Diet Ordered:: "Diabetic diet" "as much as possible" Do you have support at home?: Yes People in Home: spouse Name of Support/Comfort Primary Source: Reports essentially independent in self-care activities; supportive spouse assists as/ if needed/ indicated  Medications Reviewed Today: Medications Reviewed Today     Reviewed by Michaela Corner, RN (Registered Nurse) on 09/23/23 at 1412  Med List Status: <None>   Medication Order Taking? Sig Documenting Provider Last Dose Status Informant  albuterol (PROVENTIL) (2.5 MG/3ML) 0.083% nebulizer solution [id number]  Take 3 mLs (2.5 mg total) by nebulization every 6 (six) hours as needed for wheezing or shortness of breath. Bradd Canary, MD  Active Spouse/Significant Other  ALPRAZolam Prudy Feeler) 0.5 MG tablet [id number]  Take 1 tablet (0.5 mg total) by mouth 2 (two) times daily as needed for anxiety. Bradd Canary, MD  Active Spouse/Significant Other  arformoterol (BROVANA) 15 MCG/2ML NEBU [id number]  Take 2 mLs (15 mcg total) by nebulization daily. Charlott Holler, MD  Active Spouse/Significant Other  Ascorbic Acid (VITAMIN C) 1000 MG tablet [id number]  Take 1,000 mg by mouth daily. [provider]  Active Spouse/Significant Other  aspirin EC 81 MG tablet [id number]  Take 81 mg by mouth at bedtime. [provider]  Active Spouse/Significant Other  atorvastatin (LIPITOR) 80 MG tablet [id number]  TAKE 1 TABLET BY MOUTH EVERY DAY Bradd Canary, MD  Active   BD PEN NEEDLE NANO 2ND GEN 32G X 4 MM MISC [id number]  USE AS DIRECTED WITH HUMALOG PEN [provider]  Active Spouse/Significant Other  BIOTIN PO [id number]  Take 1 capsule by mouth daily. [provider]  Active Spouse/Significant Other  Blood Glucose Monitoring Suppl (ACCU-CHEK AVIVA PLUS) w/Device KIT [id number]  Check blood sugars  three times daily Bradd Canary, MD  Active Spouse/Significant Other  budesonide (PULMICORT) 0.25 MG/2ML nebulizer solution [id number]  Take 2 mLs (0.25 mg total) by nebulization daily. Charlott Holler, MD  Active Spouse/Significant Other  carvedilol (COREG) 25 MG tablet [id number]  TAKE ONE-HALF TABLET BY  MOUTH TWICE DAILY WITH  MEALS Bradd Canary, MD  Active Spouse/Significant Other  cholecalciferol (VITAMIN D) 1000 UNITS tablet [id number]  Take 1,000 Units by mouth daily. [provider]  Active Spouse/Significant Other  clopidogrel (PLAVIX) 75 MG tablet [id number]  TAKE 1 TABLET BY MOUTH EVERY DAY WITH BREAKFAST  Patient taking differently: Take 75 mg by mouth daily.   Bradd Canary, MD  Active Spouse/Significant Other  doxycycline (VIBRAMYCIN) 100 MG capsule [id number] Yes Take 1 capsule (100 mg total) by mouth 2 (two) times daily for 7 days. Glade Lloyd, MD Taking Active   ezetimibe (ZETIA) 10 MG tablet [id number]  Take 1 tablet (10 mg total) by mouth daily. Bradd Canary, MD  Active Spouse/Significant Other  FLUoxetine (PROZAC) 40 MG capsule [id number]  Take 1 capsule (40 mg total) by mouth daily. Bradd Canary, MD  Active   glimepiride (AMARYL) 2 MG tablet [id number]  Take 1 tablet (2 mg total) by mouth 2 (two) times daily. Bradd Canary, MD  Active Spouse/Significant Other  glucose blood (ACCU-CHEK GUIDE TEST) test strip [id number]  Check blood sugars 3 times daily Bradd Canary, MD  Active   insulin lispro (HUMALOG KWIKPEN) 100 UNIT/ML KwikPen [id number]  Before each meal 3 times a day, 140-199 - 3 units, 200-250 - 6 units, 251-299 - 8 units,  300-349 - 10 units,  350 or above 12 units. Insulin PEN if approved, provide syringes and needles if needed. Bradd Canary, MD  Active Spouse/Significant Other  Lancets (ACCU-CHEK MULTICLIX) lancets [id number]  Check blood sugars three times daily Bradd Canary, MD  Active Spouse/Significant Other  Latanoprost 0.005 % EMUL [id number]   Place 1 drop into both eyes in the morning and at bedtime. [provider]  Active Spouse/Significant Other  levothyroxine (SYNTHROID) 25 MCG tablet [id number]  TAKE 1 TABLET BY MOUTH EVERY DAY Bradd Canary, MD  Active Spouse/Significant Other  losartan (COZAAR) 50 MG tablet [id number]  TAKE 1 TABLET BY MOUTH EVERY DAY Bradd Canary, MD  Active Spouse/Significant Other  Magnesium 500 MG TABS [id number]  Take 500 mg by mouth daily. [provider]  Active Spouse/Significant Other  memantine (NAMENDA) 5 MG tablet [id number]  Take 1 tablet (5 mg total) by mouth 2 (two) times daily. Marcos Eke, PA-C  Active Spouse/Significant Other  methocarbamol (ROBAXIN) 500 MG tablet [id number] Yes Take 1 tablet (500 mg total) by mouth every 6 (six) hours as needed for muscle spasms. Glade Lloyd, MD Taking Active   Multiple Vitamin (MULTIVITAMIN) tablet [id number]  Take 1 tablet by mouth daily. [provider]  Active Spouse/Significant Other  Omega-3 Fatty Acids (FISH OIL) 1000 MG CAPS [id number]  Take 1,000 mg by mouth 2 (two) times daily. [provider]  Active Spouse/Significant Other  pioglitazone (ACTOS) 30 MG tablet [id number]  TAKE 1 TABLET BY MOUTH EVERY DAY Bradd Canary, MD  Active Spouse/Significant Other  pregabalin (LYRICA) 150 MG capsule [id number]  TAKE 1 CAPSULE BY MOUTH TWICE A DAY Bradd Canary, MD  Active Spouse/Significant Other  vitamin B-12 (CYANOCOBALAMIN)  1000 MCG tablet [id number]  Take 1,000 mcg by mouth See admin instructions. Take one tablet by mouth twice weekly on Monday and Thursday per wife [provider]  Active Spouse/Significant Other  zinc gluconate 50 MG tablet [id number]  Take 50 mg by mouth daily. [provider]  Active Spouse/Significant Other           Home Care and Equipment/Supplies: Were Home Health Services Ordered?: Yes Name of Home Health Agency:: [facility]-- RN and PT:  [phone] Has Agency set up a  time to come to your home?: Yes First Home Health Visit Date: 09/23/23 Any new equipment or medical supplies ordered?: No  Functional Questionnaire: Do you need assistance with bathing/showering or dressing?: Yes (independent at baseline; spouse assisting post-recent surgery) Do you need assistance with meal preparation?: Yes (independent at baseline; spouse assisting post-recent surgery) Do you need assistance with eating?: No Do you have difficulty maintaining continence: No Do you need assistance with getting out of bed/getting out of a chair/moving?: No Do you have difficulty managing or taking your medications?: Yes (spouse manages all aspects of medications at baseline)  Follow up appointments reviewed: PCP Follow-up appointment confirmed?: Yes (care coordination outreach in real-time with scheduling care guide to successfully schedule hospital follow up PCP appointment 09/27/23) Date of PCP follow-up appointment?: 09/27/23 Follow-up Provider: PCP- covering provider Hyman Hopes, NP Specialist Hospital Follow-up appointment confirmed?: Yes Date of Specialist follow-up appointment?: 09/24/23 Follow-Up Specialty Provider:: podiatry surgeon Do you need transportation to your follow-up appointment?: No Do you understand care options if your condition(s) worsen?: Yes-patient verbalized understanding  SDOH Interventions Today    Flowsheet Row Most Recent Value  SDOH Interventions   Food Insecurity Interventions Intervention Not Indicated  [confirmed patien t received food resources as provided by IP TOC team: today he denies food insecurity]  Housing Interventions Intervention Not Indicated  Transportation Interventions Intervention Not Indicated  [drives self at baseline,  reports spouse providing all transportation after recent surgery/ hospital discharge]  Utilities Interventions Intervention Not Indicated      Patient declines need for ongoing/ further care management/  coordination outreach- declines enrollment in 30-day [facility] program   Cherie Dark Nancey Kreitz, Charity fundraiser, BSN, Media planner  Transitions of Care  [facility] - Population Health  [place] [phone]: direct office

## 2023-09-24 ENCOUNTER — Encounter: Payer: Self-pay | Admitting: Podiatry

## 2023-09-24 ENCOUNTER — Telehealth: Payer: Self-pay

## 2023-09-24 ENCOUNTER — Ambulatory Visit: Payer: Medicare HMO | Admitting: Podiatry

## 2023-09-24 VITALS — Ht 69.0 in | Wt 265.0 lb

## 2023-09-24 DIAGNOSIS — Z89422 Acquired absence of other left toe(s): Secondary | ICD-10-CM

## 2023-09-24 LAB — AEROBIC/ANAEROBIC CULTURE W GRAM STAIN (SURGICAL/DEEP WOUND)
Gram Stain: NONE SEEN
Gram Stain: NONE SEEN

## 2023-09-24 MED ORDER — CEFADROXIL 500 MG PO CAPS: 500.0000 mg | ORAL_CAPSULE | Freq: Two times a day (BID) | ORAL | 0 refills | Status: DC

## 2023-09-24 MED ORDER — POVIDONE-IODINE 10 % EX OINT: 1.0000 | TOPICAL_OINTMENT | CUTANEOUS | 0 refills | Status: DC

## 2023-09-24 NOTE — Telephone Encounter (Signed)
Updated orders, office note and demographics faxed to [facility]  Phone [phone] Fax [phone] Confirmation received

## 2023-09-24 NOTE — Progress Notes (Signed)
  Subjective:  Patient ID: [name], male    DOB: [birth year],  MRN: [id number]  Chief Complaint  Patient presents with   Routine Post Op    RM1: hospital follow up /left 3rd toe amputation/ dos 11/14 Wound is oozing slightly had bit of blood    DOS: 09/19/2023 Procedure: Left third toe amputation  77 y.o. male returns for post-op check.  Doing okay a home nurses come out once  Review of Systems: Negative except as noted in the HPI. Denies N/V/F/Ch.   Objective:  There were no vitals filed for this visit. Body mass index is 39.13 kg/m. Constitutional Well developed. Well nourished.  Vascular Foot warm and well perfused. Capillary refill normal to all digits.  Calf is soft and supple, no posterior calf or knee pain, negative Homans' sign  Neurologic Normal speech. Oriented to person, place, and time. Epicritic sensation to light touch grossly reduced bilaterally.  Dermatologic Incision is coapted.  There is some peri-incisional erythema.  Some bleeding from site.  Orthopedic: He has no pain to palpation noted about the surgical site.   Microbiology culture results with group B strep  Assessment:   1. Status post amputation of lesser toe of left foot (HCC)    Plan:  Patient was evaluated and treated and all questions answered.  S/p foot surgery left -Will add Duricef for better coverage of his streptococcal culture results considering the erythema still present.  I recommended that home nursing come 2-3 times weekly for dressing changes and apply povidone ointment directly to the wound with dry sterile dressings.  Updated wound care instructions will be given.  Continue weightbearing as tolerated in surgical shoe return in 2 weeks for suture removal  Return in about 2 weeks (around 10/08/2023) for post op (no x-rays), suture removal.

## 2023-09-24 NOTE — Telephone Encounter (Signed)
-----   Message from Edwin Cap sent at 09/24/2023  1:14 PM EST ----- Can you fax these updated wound care instructions to [facility] so they can come 2-3 times weekly and change with povidone ointment?  Thanks

## 2023-09-25 ENCOUNTER — Inpatient Hospital Stay: Payer: Medicare HMO | Admitting: Family Medicine

## 2023-09-25 ENCOUNTER — Telehealth: Payer: Self-pay

## 2023-09-25 NOTE — Telephone Encounter (Signed)
Called receive from Mirica RN with [facility]. Requesting orders for wound care and PT evaluation after left third toe amputation.  Verbal orders given, ok to precede with Home Health care.

## 2023-09-26 DIAGNOSIS — E785 Hyperlipidemia, unspecified: Secondary | ICD-10-CM | POA: Diagnosis not present

## 2023-09-26 DIAGNOSIS — Z4781 Encounter for orthopedic aftercare following surgical amputation: Secondary | ICD-10-CM | POA: Diagnosis not present

## 2023-09-26 DIAGNOSIS — E1142 Type 2 diabetes mellitus with diabetic polyneuropathy: Secondary | ICD-10-CM | POA: Diagnosis not present

## 2023-09-26 DIAGNOSIS — E1165 Type 2 diabetes mellitus with hyperglycemia: Secondary | ICD-10-CM | POA: Diagnosis not present

## 2023-09-26 DIAGNOSIS — Z89422 Acquired absence of other left toe(s): Secondary | ICD-10-CM | POA: Diagnosis not present

## 2023-09-26 DIAGNOSIS — E1169 Type 2 diabetes mellitus with other specified complication: Secondary | ICD-10-CM | POA: Diagnosis not present

## 2023-09-26 DIAGNOSIS — E1151 Type 2 diabetes mellitus with diabetic peripheral angiopathy without gangrene: Secondary | ICD-10-CM | POA: Diagnosis not present

## 2023-09-26 DIAGNOSIS — J9611 Chronic respiratory failure with hypoxia: Secondary | ICD-10-CM | POA: Diagnosis not present

## 2023-09-26 DIAGNOSIS — M869 Osteomyelitis, unspecified: Secondary | ICD-10-CM | POA: Diagnosis not present

## 2023-09-27 ENCOUNTER — Ambulatory Visit (INDEPENDENT_AMBULATORY_CARE_PROVIDER_SITE_OTHER): Payer: Medicare HMO | Admitting: Family Medicine

## 2023-09-27 ENCOUNTER — Encounter: Payer: Self-pay | Admitting: Family Medicine

## 2023-09-27 VITALS — BP 123/57 | HR 80 | Temp 97.4°F | Ht 69.0 in | Wt 258.0 lb

## 2023-09-27 DIAGNOSIS — M542 Cervicalgia: Secondary | ICD-10-CM | POA: Diagnosis not present

## 2023-09-27 DIAGNOSIS — M869 Osteomyelitis, unspecified: Secondary | ICD-10-CM | POA: Diagnosis not present

## 2023-09-27 DIAGNOSIS — Z09 Encounter for follow-up examination after completed treatment for conditions other than malignant neoplasm: Secondary | ICD-10-CM | POA: Diagnosis not present

## 2023-09-27 NOTE — Progress Notes (Signed)
Acute Office Visit  Subjective:     Patient ID: [name], male    DOB: 1946-04-09, 77 y.o.   MRN: [id number]  Chief Complaint  Patient presents with   Hospitalization Follow-up    HPI Patient is in today for hospital follow-up.  Discussed the use of AI scribe software for clinical note transcription with the patient, who gave verbal consent to proceed.  History of Present Illness   The patient, with a history of diabetes and previous toe amputation, presented for a follow-up after a recent hospitalization for left 3rd toe amputation. The patient reported a longstanding issue with the third toe on the left foot, which had been problematic for approximately four years. The toe had developed a small sore that eventually opened and became infected. The patient attributed the infection to his diabetes. The infection became severe, with red streaks and blackening of the toe, prompting hospital admission and subsequent amputation.  Post-operatively, the patient was placed on oral antibiotics due to persistent redness at the surgical site. Home health nursing was arranged for dressing changes two to three times a week. The patient reported no pain and believed the surgical site was improving. However, there was some reported drainage, but not enough to soak through the dressing.  The patient also reported a persistent neck pain, which started during his hospital stay. The pain was located from the base of the neck and radiated down the shoulder. Despite using a muscle relaxer, heating pad, and topical analgesics, the pain remained noticeable. The patient had not found significant relief from these interventions.  The patient's blood sugars at home were slightly above the goal, averaging around 144-146. There was also some swelling noted in the surgical foot, but the patient had been elevating it regularly. The patient was also taking a probiotic to maintain gut health while on antibiotics.           ROS All review of systems negative except what is listed in the HPI      Objective:    BP (!) 123/57   Pulse 80   Temp (!) 97.4 F (36.3 C) (Oral)   Ht 5\' 9"  (1.753 m)   Wt 258 lb (117 kg)   SpO2 93%   BMI 38.10 kg/m    Physical Exam Vitals reviewed.  Constitutional:      Appearance: Normal appearance.  Cardiovascular:     Rate and Rhythm: Normal rate and regular rhythm.     Heart sounds: Normal heart sounds.  Pulmonary:     Effort: Pulmonary effort is normal.     Breath sounds: Normal breath sounds.  Musculoskeletal:     Comments: LLE with +3, dressing in place, no streaking/erythema up leg Left upper trap/neck with palpable muscle tension  Skin:    General: Skin is warm and dry.  Neurological:     Mental Status: He is alert and oriented to person, place, and time.  Psychiatric:        Mood and Affect: Mood normal.        Behavior: Behavior normal.        Thought Content: Thought content normal.        Judgment: Judgment normal.     123/57    No results found for any visits on 09/27/23.      Assessment & Plan:   Problem List Items Addressed This Visit       Active Problems   Neck pain   Osteomyelitis of left foot (  [facility])   Other Visit Diagnoses     Hospital discharge follow-up    -  Primary     Diabetic Foot Infection Status Post Amputation Recent amputation of the third toe on the left foot due to severe infection. Currently on oral antibiotics and receiving home health nursing for dressing changes 2-3 times a week. No current pain and minimal drainage noted. Some swelling present. -Continue current antibiotic regimen. -Continue home health nursing for dressing changes. -Keep foot elevated as much as possible. -Tight control of blood pressure and blood sugars -Follow up with podiatry as scheduled  Neck Pain Chronic neck pain with recent exacerbation. No relief with current muscle relaxer. Noted palpable knots in the upper  trapezius muscle. -Continue muscle relaxer, especially at night. -Apply heating pad several times a day. -Perform gentle pressure massage on knots after heating pad use. -Perform printed neck exercises after heating pad use. -Consider outpatient physical therapy for dry needling if no improvement.    No orders of the defined types were placed in this encounter.   Return if symptoms worsen or fail to improve, for ; keep regular PCP follow-up appointment .  Clayborne Dana, NP

## 2023-10-01 ENCOUNTER — Encounter: Payer: Self-pay | Admitting: Family Medicine

## 2023-10-01 DIAGNOSIS — E1165 Type 2 diabetes mellitus with hyperglycemia: Secondary | ICD-10-CM | POA: Diagnosis not present

## 2023-10-01 DIAGNOSIS — H401131 Primary open-angle glaucoma, bilateral, mild stage: Secondary | ICD-10-CM | POA: Diagnosis not present

## 2023-10-01 DIAGNOSIS — H524 Presbyopia: Secondary | ICD-10-CM | POA: Diagnosis not present

## 2023-10-01 DIAGNOSIS — M869 Osteomyelitis, unspecified: Secondary | ICD-10-CM | POA: Diagnosis not present

## 2023-10-01 DIAGNOSIS — Z4781 Encounter for orthopedic aftercare following surgical amputation: Secondary | ICD-10-CM | POA: Diagnosis not present

## 2023-10-01 DIAGNOSIS — E1151 Type 2 diabetes mellitus with diabetic peripheral angiopathy without gangrene: Secondary | ICD-10-CM | POA: Diagnosis not present

## 2023-10-01 DIAGNOSIS — E1169 Type 2 diabetes mellitus with other specified complication: Secondary | ICD-10-CM | POA: Diagnosis not present

## 2023-10-01 DIAGNOSIS — J9611 Chronic respiratory failure with hypoxia: Secondary | ICD-10-CM | POA: Diagnosis not present

## 2023-10-01 DIAGNOSIS — E119 Type 2 diabetes mellitus without complications: Secondary | ICD-10-CM | POA: Diagnosis not present

## 2023-10-01 DIAGNOSIS — E785 Hyperlipidemia, unspecified: Secondary | ICD-10-CM | POA: Diagnosis not present

## 2023-10-01 DIAGNOSIS — Z89422 Acquired absence of other left toe(s): Secondary | ICD-10-CM | POA: Diagnosis not present

## 2023-10-01 DIAGNOSIS — E1142 Type 2 diabetes mellitus with diabetic polyneuropathy: Secondary | ICD-10-CM | POA: Diagnosis not present

## 2023-10-01 LAB — HM DIABETES EYE EXAM

## 2023-10-08 ENCOUNTER — Encounter: Payer: Self-pay | Admitting: Podiatry

## 2023-10-08 ENCOUNTER — Ambulatory Visit (INDEPENDENT_AMBULATORY_CARE_PROVIDER_SITE_OTHER): Payer: Medicare HMO | Admitting: Podiatry

## 2023-10-08 DIAGNOSIS — D649 Anemia, unspecified: Secondary | ICD-10-CM

## 2023-10-08 DIAGNOSIS — F411 Generalized anxiety disorder: Secondary | ICD-10-CM

## 2023-10-08 DIAGNOSIS — Z89422 Acquired absence of other left toe(s): Secondary | ICD-10-CM

## 2023-10-08 DIAGNOSIS — E1165 Type 2 diabetes mellitus with hyperglycemia: Secondary | ICD-10-CM | POA: Diagnosis not present

## 2023-10-08 DIAGNOSIS — M5441 Lumbago with sciatica, right side: Secondary | ICD-10-CM | POA: Diagnosis not present

## 2023-10-08 DIAGNOSIS — J9611 Chronic respiratory failure with hypoxia: Secondary | ICD-10-CM | POA: Diagnosis not present

## 2023-10-08 DIAGNOSIS — E785 Hyperlipidemia, unspecified: Secondary | ICD-10-CM | POA: Diagnosis not present

## 2023-10-08 DIAGNOSIS — I10 Essential (primary) hypertension: Secondary | ICD-10-CM

## 2023-10-08 DIAGNOSIS — Z4781 Encounter for orthopedic aftercare following surgical amputation: Secondary | ICD-10-CM | POA: Diagnosis not present

## 2023-10-08 DIAGNOSIS — E1151 Type 2 diabetes mellitus with diabetic peripheral angiopathy without gangrene: Secondary | ICD-10-CM | POA: Diagnosis not present

## 2023-10-08 DIAGNOSIS — E1169 Type 2 diabetes mellitus with other specified complication: Secondary | ICD-10-CM | POA: Diagnosis not present

## 2023-10-08 DIAGNOSIS — E1142 Type 2 diabetes mellitus with diabetic polyneuropathy: Secondary | ICD-10-CM | POA: Diagnosis not present

## 2023-10-08 DIAGNOSIS — M869 Osteomyelitis, unspecified: Secondary | ICD-10-CM | POA: Diagnosis not present

## 2023-10-08 NOTE — Progress Notes (Signed)
  Subjective:  Patient ID: [name], male    DOB: May 03, 1946,  MRN: [id number]  Chief Complaint  Patient presents with   Routine Post Op    PATIENT STATES THAT HE HAS BEEN WONDERFUL , PATIENT WIFE STATES NONE OF THE STORES HAD THE MEDICATION THAT DOCTOR PRESCRIBED . PATIENT WIFE STATES SHE HAS HAD SOME OF THE IODINE AND SHE HAS BEEN USING SOME PF THAT.  NO MEDICATION FOR PAIN .  PATIENT WIFE SAYS THAT THE NURSES REFUSE TO COME OUT TWICE A WEEK , THEY ONLY COME ONCE A WEEK TO CHANGE PATIENT BANDAGES.     DOS: 09/19/2023 Procedure: Left third toe amputation  77 y.o. male returns for post-op check.  He completed the cefadroxil and they were not able to find the povidone ointment but have been using liquid Betadine  Review of Systems: Negative except as noted in the HPI. Denies N/V/F/Ch.   Objective:  There were no vitals filed for this visit. There is no height or weight on file to calculate BMI. Constitutional Well developed. Well nourished.  Vascular Foot warm and well perfused. Capillary refill normal to all digits.  Calf is soft and supple, no posterior calf or knee pain, negative Homans' sign  Neurologic Normal speech. Oriented to person, place, and time. Epicritic sensation to light touch grossly reduced bilaterally.  Dermatologic Incision is well-healed with the exception of a small half centimeter midportion that has shallow area of delayed healing with no signs infection drainage has resolved erythema has resolved  Orthopedic: He has no pain to palpation noted about the surgical site.   Microbiology culture results with group B strep  Assessment:   1. Status post amputation of lesser toe of left foot (HCC)    Plan:  Patient was evaluated and treated and all questions answered.  S/p foot surgery left -Doing much better and nearly fully healed at this point.  Small area of residual healing needs to happen and they will continue dressing daily with a bandage and  Betadine.  Okay to wash the foot at this point.  He may resume work in 1 to 2 weeks pending his progress.  May return to regular shoe gear.  I will see him back in 2 weeks for final follow-up.  Return in about 2 weeks (around 10/22/2023) for wound care.

## 2023-10-10 DIAGNOSIS — E1142 Type 2 diabetes mellitus with diabetic polyneuropathy: Secondary | ICD-10-CM | POA: Diagnosis not present

## 2023-10-10 DIAGNOSIS — Z4781 Encounter for orthopedic aftercare following surgical amputation: Secondary | ICD-10-CM | POA: Diagnosis not present

## 2023-10-10 DIAGNOSIS — E1169 Type 2 diabetes mellitus with other specified complication: Secondary | ICD-10-CM | POA: Diagnosis not present

## 2023-10-10 DIAGNOSIS — M869 Osteomyelitis, unspecified: Secondary | ICD-10-CM | POA: Diagnosis not present

## 2023-10-10 DIAGNOSIS — Z89422 Acquired absence of other left toe(s): Secondary | ICD-10-CM | POA: Diagnosis not present

## 2023-10-10 DIAGNOSIS — E785 Hyperlipidemia, unspecified: Secondary | ICD-10-CM | POA: Diagnosis not present

## 2023-10-10 DIAGNOSIS — E1165 Type 2 diabetes mellitus with hyperglycemia: Secondary | ICD-10-CM | POA: Diagnosis not present

## 2023-10-10 DIAGNOSIS — J9611 Chronic respiratory failure with hypoxia: Secondary | ICD-10-CM | POA: Diagnosis not present

## 2023-10-10 DIAGNOSIS — E1151 Type 2 diabetes mellitus with diabetic peripheral angiopathy without gangrene: Secondary | ICD-10-CM | POA: Diagnosis not present

## 2023-10-21 ENCOUNTER — Encounter: Payer: Self-pay | Admitting: Podiatry

## 2023-10-21 ENCOUNTER — Ambulatory Visit: Payer: Medicare HMO | Admitting: Podiatry

## 2023-10-21 DIAGNOSIS — L97522 Non-pressure chronic ulcer of other part of left foot with fat layer exposed: Secondary | ICD-10-CM | POA: Diagnosis not present

## 2023-10-21 NOTE — Progress Notes (Signed)
  Subjective:  Patient ID: [name], male    DOB: 1945/12/11,  MRN: [id number]  Chief Complaint  Patient presents with   Routine Post Op    POV # 3  DOS 09/19/2023 Left third toe amputation "It's doing real good."     DOS: 09/19/2023 Procedure: Left third toe amputation  77 y.o. male returns for post-op check.    Review of Systems: Negative except as noted in the HPI. Denies N/V/F/Ch.   Objective:  There were no vitals filed for this visit. There is no height or weight on file to calculate BMI. Constitutional Well developed. Well nourished.  Vascular Foot warm and well perfused. Capillary refill normal to all digits.  Calf is soft and supple, no posterior calf or knee pain, negative Homans' sign  Neurologic Normal speech. Oriented to person, place, and time. Epicritic sensation to light touch grossly reduced bilaterally.  Dermatologic Small residual ulceration at distal portion of incision measuring 0.4 x 0.3 x 0.5 cm with exposed subcutaneous tissue no signs of infection exposed bone tendon or joint  Orthopedic: He has no pain to palpation noted about the surgical site.   Microbiology culture results with group B strep  Assessment:   1. Ulcerated, foot, left, with fat layer exposed (HCC)     Plan:  Patient was evaluated and treated and all questions answered.  S/p foot surgery left -Overall doing well still has some residual ulceration.  I debrided the ulceration to the subcutaneous layer and excisional manner with a sharp scalpel to remove nonviable tissue slough and biofilm.  Postdebridement measurements are noted above.  We will dress daily with collagen matrix pad such as Prisma and this was dispensed today.  He will return for follow-up in 1 month dress daily he may gradually return to work in regular shoes Return in about 1 month (around 11/21/2023) for wound care.

## 2023-11-08 ENCOUNTER — Ambulatory Visit: Payer: Medicare Other | Admitting: Podiatry

## 2023-11-08 ENCOUNTER — Ambulatory Visit (INDEPENDENT_AMBULATORY_CARE_PROVIDER_SITE_OTHER): Payer: Medicare HMO

## 2023-11-08 ENCOUNTER — Encounter: Payer: Self-pay | Admitting: Podiatry

## 2023-11-08 DIAGNOSIS — M778 Other enthesopathies, not elsewhere classified: Secondary | ICD-10-CM

## 2023-11-08 DIAGNOSIS — L97529 Non-pressure chronic ulcer of other part of left foot with unspecified severity: Secondary | ICD-10-CM

## 2023-11-08 DIAGNOSIS — B351 Tinea unguium: Secondary | ICD-10-CM

## 2023-11-08 DIAGNOSIS — L97512 Non-pressure chronic ulcer of other part of right foot with fat layer exposed: Secondary | ICD-10-CM | POA: Diagnosis not present

## 2023-11-08 DIAGNOSIS — L03032 Cellulitis of left toe: Secondary | ICD-10-CM | POA: Diagnosis not present

## 2023-11-08 DIAGNOSIS — Z7901 Long term (current) use of anticoagulants: Secondary | ICD-10-CM | POA: Diagnosis not present

## 2023-11-08 DIAGNOSIS — E11621 Type 2 diabetes mellitus with foot ulcer: Secondary | ICD-10-CM | POA: Diagnosis not present

## 2023-11-08 MED ORDER — DOXYCYCLINE HYCLATE 100 MG PO TABS: 100.0000 mg | ORAL_TABLET | Freq: Two times a day (BID) | ORAL | 0 refills | Status: DC

## 2023-11-08 MED ORDER — AMOXICILLIN-POT CLAVULANATE 875-125 MG PO TABS: 1.0000 | ORAL_TABLET | Freq: Two times a day (BID) | ORAL | 0 refills | Status: DC

## 2023-11-08 NOTE — Progress Notes (Signed)
 Subjective: Chief Complaint  Patient presents with   [facility]    RM#12 [facility]  patient also has a request for packing for left foot ulcerated callus.    78 year old male presents With above concerns.  He also is still using Prisma on the wound status post third toe potation on the left foot.  He has noticed the second toe becoming more swollen over the last couple days.  Does not report any fevers or chills although he does have a cold currently.  Objective: AAO x3, NAD DP/PT pulses palpable bilaterally, CRT less than 3 seconds There is small superficial wound still present along the area the previous amputation of the third toe without any probing, undermining or tunneling there is no fluctuation or crepitation there is no malodor.  There is edema and erythema present of the second toe with minimal amount of drainage, purulence in the distal portion.  Upon debridement there is an ulcer present there is no probing to bone.   The nails on the right foot are hypertrophic and dystrophic with yellow, brown discoloration.  There is no open lesions on the right foot. No pain with calf compression, swelling, warmth, erythema  Assessment: Right foot symptomatic onychomycosis; left second toe infection; wound status post third amputation  Plan: In regards to the left second toe quite concerned of infection of the toe.  Discussed with him that he is at very high risk of amputation of the toe.  Should he need to undergo amputation we discussed a transmetatarsal amputation given his previous toe amputations.  Will start Augmentin , doxycycline .  I did check blood work regularly CBC, sed rate, CRP. Surgical shoe. Elevation.  Should be any worsening signs or symptoms of infection reported to the emergency room.  Wound s/p third amputation is healing well and superficial.  Continue present dressing changes daily.  Sharply debrided nails x 5 on the right foot complications or bleeding.   Radiology: X-rays obtained  of the left foot.  There is concern for possible cortical changes of the second toe distally there is no soft tissue emphysema.  No follow-ups on file.  [name] DPM

## 2023-11-09 LAB — SEDIMENTATION RATE: Sed Rate: 26 mm/h (ref 0–30)

## 2023-11-09 LAB — CBC WITH DIFFERENTIAL/PLATELET
Basophils Absolute: 0.1 10*3/uL (ref 0.0–0.2)
Basos: 1 %
EOS (ABSOLUTE): 0.3 10*3/uL (ref 0.0–0.4)
Eos: 4 %
Hematocrit: 42.5 % (ref 37.5–51.0)
Hemoglobin: 13.8 g/dL (ref 13.0–17.7)
Immature Grans (Abs): 0 10*3/uL (ref 0.0–0.1)
Immature Granulocytes: 1 %
Lymphocytes Absolute: 2.3 10*3/uL (ref 0.7–3.1)
Lymphs: 33 %
MCH: 31.2 pg (ref 26.6–33.0)
MCHC: 32.5 g/dL (ref 31.5–35.7)
MCV: 96 fL (ref 79–97)
Monocytes Absolute: 0.5 10*3/uL (ref 0.1–0.9)
Monocytes: 8 %
Neutrophils Absolute: 3.9 10*3/uL (ref 1.4–7.0)
Neutrophils: 53 %
Platelets: 218 10*3/uL (ref 150–450)
RBC: 4.42 x10E6/uL (ref 4.14–5.80)
RDW: 12.7 % (ref 11.6–15.4)
WBC: 7.1 10*3/uL (ref 3.4–10.8)

## 2023-11-09 LAB — C-REACTIVE PROTEIN: CRP: 3 mg/L (ref 0–10)

## 2023-11-11 ENCOUNTER — Encounter: Payer: Self-pay | Admitting: Podiatry

## 2023-11-18 DIAGNOSIS — F0390 Unspecified dementia without behavioral disturbance: Secondary | ICD-10-CM | POA: Insufficient documentation

## 2023-11-18 NOTE — Assessment & Plan Note (Signed)
 hgba1c acceptable, minimize simple carbs. Increase exercise as tolerated. Continue current meds

## 2023-11-18 NOTE — Assessment & Plan Note (Signed)
 Encourage heart healthy diet such as MIND or DASH diet, increase exercise, avoid trans fats, simple carbohydrates and processed foods, consider a krill or fish or flaxseed oil cap daily. Tolerating Atorvastatin

## 2023-11-18 NOTE — Assessment & Plan Note (Signed)
Following with neurology and tolerating Namenda

## 2023-11-18 NOTE — Assessment & Plan Note (Signed)
 Hydrate and monitor

## 2023-11-18 NOTE — Assessment & Plan Note (Signed)
 On Levothyroxine, continue to monitor

## 2023-11-19 ENCOUNTER — Encounter: Payer: Self-pay | Admitting: Family Medicine

## 2023-11-19 ENCOUNTER — Encounter: Payer: Self-pay | Admitting: Podiatry

## 2023-11-19 ENCOUNTER — Ambulatory Visit (INDEPENDENT_AMBULATORY_CARE_PROVIDER_SITE_OTHER): Payer: Medicare Other

## 2023-11-19 ENCOUNTER — Ambulatory Visit: Payer: Medicare Other | Admitting: Podiatry

## 2023-11-19 ENCOUNTER — Ambulatory Visit (INDEPENDENT_AMBULATORY_CARE_PROVIDER_SITE_OTHER): Payer: Medicare Other | Admitting: Family Medicine

## 2023-11-19 VITALS — BP 146/84 | HR 66 | Temp 98.5°F | Resp 16 | Ht 69.0 in | Wt 262.4 lb

## 2023-11-19 DIAGNOSIS — E1149 Type 2 diabetes mellitus with other diabetic neurological complication: Secondary | ICD-10-CM | POA: Diagnosis not present

## 2023-11-19 DIAGNOSIS — L03032 Cellulitis of left toe: Secondary | ICD-10-CM

## 2023-11-19 DIAGNOSIS — N289 Disorder of kidney and ureter, unspecified: Secondary | ICD-10-CM

## 2023-11-19 DIAGNOSIS — M1A9XX0 Chronic gout, unspecified, without tophus (tophi): Secondary | ICD-10-CM | POA: Diagnosis not present

## 2023-11-19 DIAGNOSIS — M778 Other enthesopathies, not elsewhere classified: Secondary | ICD-10-CM | POA: Diagnosis not present

## 2023-11-19 DIAGNOSIS — F0390 Unspecified dementia without behavioral disturbance: Secondary | ICD-10-CM

## 2023-11-19 DIAGNOSIS — Z79899 Other long term (current) drug therapy: Secondary | ICD-10-CM

## 2023-11-19 DIAGNOSIS — E039 Hypothyroidism, unspecified: Secondary | ICD-10-CM

## 2023-11-19 DIAGNOSIS — L97521 Non-pressure chronic ulcer of other part of left foot limited to breakdown of skin: Secondary | ICD-10-CM

## 2023-11-19 DIAGNOSIS — E782 Mixed hyperlipidemia: Secondary | ICD-10-CM

## 2023-11-19 DIAGNOSIS — F419 Anxiety disorder, unspecified: Secondary | ICD-10-CM

## 2023-11-19 LAB — TSH: TSH: 3.13 u[IU]/mL (ref 0.35–5.50)

## 2023-11-19 LAB — CBC WITH DIFFERENTIAL/PLATELET
Basophils Absolute: 0 10*3/uL (ref 0.0–0.1)
Basophils Relative: 0.5 % (ref 0.0–3.0)
Eosinophils Absolute: 0.2 10*3/uL (ref 0.0–0.7)
Eosinophils Relative: 2.9 % (ref 0.0–5.0)
HCT: 43.1 % (ref 39.0–52.0)
Hemoglobin: 14 g/dL (ref 13.0–17.0)
Lymphocytes Relative: 32.6 % (ref 12.0–46.0)
Lymphs Abs: 2.2 10*3/uL (ref 0.7–4.0)
MCHC: 32.5 g/dL (ref 30.0–36.0)
MCV: 98.2 fL (ref 78.0–100.0)
Monocytes Absolute: 0.5 10*3/uL (ref 0.1–1.0)
Monocytes Relative: 7.2 % (ref 3.0–12.0)
Neutro Abs: 3.8 10*3/uL (ref 1.4–7.7)
Neutrophils Relative %: 56.8 % (ref 43.0–77.0)
Platelets: 215 10*3/uL (ref 150.0–400.0)
RBC: 4.39 Mil/uL (ref 4.22–5.81)
RDW: 14.8 % (ref 11.5–15.5)
WBC: 6.7 10*3/uL (ref 4.0–10.5)

## 2023-11-19 LAB — COMPREHENSIVE METABOLIC PANEL
ALT: 33 U/L (ref 0–53)
AST: 23 U/L (ref 0–37)
Albumin: 4.1 g/dL (ref 3.5–5.2)
Alkaline Phosphatase: 93 U/L (ref 39–117)
BUN: 18 mg/dL (ref 6–23)
CO2: 30 meq/L (ref 19–32)
Calcium: 9 mg/dL (ref 8.4–10.5)
Chloride: 104 meq/L (ref 96–112)
Creatinine, Ser: 0.79 mg/dL (ref 0.40–1.50)
GFR: 86.44 mL/min (ref >60.00)
Glucose, Bld: 83 mg/dL (ref 70–99)
Potassium: 4.5 meq/L (ref 3.5–5.1)
Sodium: 141 meq/L (ref 135–145)
Total Bilirubin: 0.4 mg/dL (ref 0.2–1.2)
Total Protein: 6.3 g/dL (ref 6.0–8.3)

## 2023-11-19 LAB — URIC ACID: Uric Acid, Serum: 4.5 mg/dL (ref 4.0–7.8)

## 2023-11-19 LAB — HEMOGLOBIN A1C: Hgb A1c MFr Bld: 7 % — ABNORMAL HIGH (ref 4.6–6.5)

## 2023-11-19 MED ORDER — AMOXICILLIN-POT CLAVULANATE 875-125 MG PO TABS: 1.0000 | ORAL_TABLET | Freq: Two times a day (BID) | ORAL | 0 refills | Status: DC

## 2023-11-19 MED ORDER — DOXYCYCLINE HYCLATE 100 MG PO TABS: 100.0000 mg | ORAL_TABLET | Freq: Two times a day (BID) | ORAL | 0 refills | Status: DC

## 2023-11-19 NOTE — Progress Notes (Signed)
 Subjective: Chief Complaint  Patient presents with   Wound Check     I feel the foot is about the same since the last OV     78 year old male presents With above concerns.  His wife's been keeping Betadine  on the wound on the second toe.  The toe seems to be doing better and less swollen and red.  He states that he wants to proceed with the surgery however he is not able to do this time as his wife is getting ready to have surgery.  Does not report any fever or chills.  Has no new concerns today.  Objective: AAO x3, NAD DP/PT pulses palpable bilaterally, CRT less than 3 seconds To the left second toe there is still some edema and erythema present of the toe but overall much improved.  There is no fluctuation, crepitation, malodor.  Pain or ulceration of the distal portion of the toe with hyperkeratotic periwound.  After debrided it measured 0.4 x 0.4 x 0.1 cm, superficial.  There is no probing, undermining or tunneling. No pain with calf compression, swelling, warmth, erythema  Assessment: Ulceration left second toe    Plan: Still quite concerned of the second toe.  He does want to proceed with amputation however not able to do this time as his wife is getting ready to have surgery.  Clinically does seem to be much improved and is stable.  Continue for another round of antibiotics which I refilled today.  Continue Betadine  dressing changes for now.  Continue surgical shoe, offloading and elevation.  He is monitor closely (signs or symptoms of worsening infection call the office or report to emergency room should any occur.  I think the wound to be most today.  I sharply debrided the ulceration utilizing the 3, scalpel to debride all nonviable, devitalized tissue in order to promote wound healing.  Minimal blood loss.  Hemostasis achieved to medial compression.  Also had a lot of callus which make it difficult to measure but post-debridement measurements are noted above.  The wound is  superficial.  Cleaned with saline.  Betadine  applied followed by dressing.  Radiology: X-rays obtained reviewed of the left foot.  There is some questional cortical changes on the distal phalanx concerning for osteomyelitis appears to be stable.  There is no soft tissue edema.  Digital contracture present which limits the exam.   No follow-ups on file.  [name] DPM

## 2023-11-20 ENCOUNTER — Other Ambulatory Visit: Payer: Self-pay | Admitting: Emergency Medicine

## 2023-11-20 DIAGNOSIS — M1A9XX0 Chronic gout, unspecified, without tophus (tophi): Secondary | ICD-10-CM

## 2023-11-20 DIAGNOSIS — J449 Chronic obstructive pulmonary disease, unspecified: Secondary | ICD-10-CM | POA: Diagnosis not present

## 2023-11-20 DIAGNOSIS — E039 Hypothyroidism, unspecified: Secondary | ICD-10-CM

## 2023-11-20 DIAGNOSIS — E1149 Type 2 diabetes mellitus with other diabetic neurological complication: Secondary | ICD-10-CM

## 2023-11-20 LAB — MICROALBUMIN / CREATININE URINE RATIO
Creatinine,U: 161.8 mg/dL
Microalb Creat Ratio: 2.1 mg/g (ref 0.0–30.0)
Microalb, Ur: 3.4 mg/dL — ABNORMAL HIGH (ref 0.0–1.9)
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
## 2023-11-21 ENCOUNTER — Other Ambulatory Visit: Payer: Self-pay | Admitting: Family Medicine

## 2023-11-21 ENCOUNTER — Other Ambulatory Visit: Payer: Self-pay | Admitting: *Deleted

## 2023-11-21 DIAGNOSIS — I739 Peripheral vascular disease, unspecified: Secondary | ICD-10-CM

## 2023-11-21 DIAGNOSIS — I7143 Infrarenal abdominal aortic aneurysm, without rupture: Secondary | ICD-10-CM

## 2023-11-21 DIAGNOSIS — I771 Stricture of artery: Secondary | ICD-10-CM

## 2023-11-21 LAB — DRUG MONITORING PANEL 376104, URINE
Alphahydroxyalprazolam: 197 ng/mL — ABNORMAL HIGH
Alphahydroxymidazolam: NEGATIVE ng/mL
Alphahydroxytriazolam: NEGATIVE ng/mL
Aminoclonazepam: NEGATIVE ng/mL
Amphetamines: NEGATIVE ng/mL
Barbiturates: NEGATIVE ng/mL
Benzodiazepines: POSITIVE ng/mL — AB
Cocaine Metabolite: NEGATIVE ng/mL
Codeine: NEGATIVE ng/mL
Desmethyltramadol: NEGATIVE ng/mL
Hydrocodone: 679 ng/mL — ABNORMAL HIGH
Hydromorphone: NEGATIVE ng/mL
Hydroxyethylflurazepam: NEGATIVE ng/mL
Lorazepam: NEGATIVE ng/mL
Morphine: NEGATIVE ng/mL
Nordiazepam: NEGATIVE ng/mL
Norhydrocodone: 607 ng/mL — ABNORMAL HIGH
Opiates: POSITIVE ng/mL — AB
Oxazepam: NEGATIVE ng/mL
Oxycodone: NEGATIVE ng/mL
Temazepam: NEGATIVE ng/mL
Tramadol: NEGATIVE ng/mL

## 2023-11-21 LAB — DM TEMPLATE
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
## 2023-11-26 ENCOUNTER — Ambulatory Visit (HOSPITAL_COMMUNITY)
Admission: RE | Admit: 2023-11-26 | Discharge: 2023-11-26 | Disposition: A | Discharge status: Home / Self Care | Payer: Medicare Other | Source: Ambulatory Visit | Attending: Vascular Surgery | Admitting: Vascular Surgery

## 2023-11-26 ENCOUNTER — Ambulatory Visit (INDEPENDENT_AMBULATORY_CARE_PROVIDER_SITE_OTHER)
Admit: 2023-11-26 | Discharge: 2023-11-26 | Disposition: A | Discharge status: Home / Self Care | Payer: Medicare Other | Attending: Vascular Surgery

## 2023-11-26 ENCOUNTER — Ambulatory Visit (INDEPENDENT_AMBULATORY_CARE_PROVIDER_SITE_OTHER): Payer: Medicare Other

## 2023-11-26 VITALS — BP 149/82 | HR 64 | Temp 97.6°F | Resp 18 | Ht 69.0 in | Wt 256.7 lb

## 2023-11-26 DIAGNOSIS — I739 Peripheral vascular disease, unspecified: Secondary | ICD-10-CM | POA: Diagnosis not present

## 2023-11-26 DIAGNOSIS — I7143 Infrarenal abdominal aortic aneurysm, without rupture: Secondary | ICD-10-CM

## 2023-11-26 DIAGNOSIS — I771 Stricture of artery: Secondary | ICD-10-CM | POA: Diagnosis not present

## 2023-11-26 LAB — VAS US ABI WITH/WO TBI
Left ABI: 1.02
Right ABI: 1.16

## 2023-11-29 DIAGNOSIS — J449 Chronic obstructive pulmonary disease, unspecified: Secondary | ICD-10-CM | POA: Diagnosis not present

## 2023-12-02 ENCOUNTER — Encounter: Payer: Self-pay | Admitting: Internal Medicine

## 2023-12-03 ENCOUNTER — Ambulatory Visit: Payer: Medicare Other | Admitting: Podiatry

## 2023-12-03 ENCOUNTER — Encounter: Payer: Self-pay | Admitting: Family Medicine

## 2023-12-03 ENCOUNTER — Encounter: Payer: Self-pay | Admitting: Podiatry

## 2023-12-03 ENCOUNTER — Other Ambulatory Visit: Payer: Self-pay | Admitting: Emergency Medicine

## 2023-12-03 DIAGNOSIS — L97522 Non-pressure chronic ulcer of other part of left foot with fat layer exposed: Secondary | ICD-10-CM | POA: Diagnosis not present

## 2023-12-03 MED ORDER — ALBUTEROL SULFATE (2.5 MG/3ML) 0.083% IN NEBU: 2.5000 mg | INHALATION_SOLUTION | Freq: Four times a day (QID) | RESPIRATORY_TRACT | 1 refills | Status: DC | PRN

## 2023-12-03 MED ORDER — DOXYCYCLINE HYCLATE 100 MG PO TABS: 100.0000 mg | ORAL_TABLET | Freq: Two times a day (BID) | ORAL | 0 refills | Status: DC

## 2023-12-03 MED ORDER — AMOXICILLIN-POT CLAVULANATE 875-125 MG PO TABS: 1.0000 | ORAL_TABLET | Freq: Two times a day (BID) | ORAL | 0 refills | Status: DC

## 2023-12-03 NOTE — Progress Notes (Signed)
Subjective: Chief Complaint  Patient presents with   Wound Check    RM#11 Left foot first and second toe ulcer check patient states no change.    78 year old male presents With above concerns.  He just wants course antibiotics yesterday.  This open keeping Betadine on the wound daily.  Overall he thinks that the toes been doing somewhat better.  Denies any drainage or pus.  No increase in swelling or redness.  No fevers or chills.  No other concerns.    Objective: AAO x3, NAD DP/PT pulses palpable bilaterally, CRT less than 3 seconds To the left second toe there is still some mild edema and erythema present of the toe but overall much improved compared to what it was when I first saw him for this issue there is no fluctuation, crepitation, malodor.  Pain or ulceration of the distal portion of the toe with hyperkeratotic periwound.  After debrided it measured 0.3 x 0.3 x 0.1 cm, superficial.  There is no probing, undermining or tunneling. Digital contractures present. No pain with calf compression, swelling, warmth, erythema  Assessment: Ulceration left second toe  Plan: Debride ulceration to any complications or bleeding.  Will continue Betadine dressing changes for now.  Continue offloading at all times.  I do think ultimately he is left from a transmetatarsal potation elevate his wife seem to have surgery on Friday he cannot have surgery right now.  I do want to keep on the antibiotics and refilled them today.  He is to keep a very close monitoring of the wounds and should there be any signs or symptoms of worsening infection to the report to the emergency room.  Return in about 2 weeks (around 12/17/2023) for toe ulcer, x-ray .  Vivi Barrack DPM

## 2023-12-04 DIAGNOSIS — Z9889 Other specified postprocedural states: Secondary | ICD-10-CM | POA: Diagnosis not present

## 2023-12-04 DIAGNOSIS — M5441 Lumbago with sciatica, right side: Secondary | ICD-10-CM | POA: Diagnosis not present

## 2023-12-04 DIAGNOSIS — Z7901 Long term (current) use of anticoagulants: Secondary | ICD-10-CM | POA: Diagnosis not present

## 2023-12-04 DIAGNOSIS — J449 Chronic obstructive pulmonary disease, unspecified: Secondary | ICD-10-CM | POA: Diagnosis not present

## 2023-12-04 DIAGNOSIS — I739 Peripheral vascular disease, unspecified: Secondary | ICD-10-CM | POA: Diagnosis not present

## 2023-12-05 ENCOUNTER — Other Ambulatory Visit: Payer: Self-pay | Admitting: Family Medicine

## 2023-12-09 ENCOUNTER — Other Ambulatory Visit: Payer: Self-pay | Admitting: Pain Medicine

## 2023-12-09 DIAGNOSIS — T1590XA Foreign body on external eye, part unspecified, unspecified eye, initial encounter: Secondary | ICD-10-CM

## 2023-12-09 DIAGNOSIS — M5416 Radiculopathy, lumbar region: Secondary | ICD-10-CM

## 2023-12-09 DIAGNOSIS — M5414 Radiculopathy, thoracic region: Secondary | ICD-10-CM

## 2023-12-17 ENCOUNTER — Ambulatory Visit (INDEPENDENT_AMBULATORY_CARE_PROVIDER_SITE_OTHER): Payer: Medicare Other

## 2023-12-17 ENCOUNTER — Ambulatory Visit: Payer: Medicare Other | Admitting: Podiatry

## 2023-12-17 ENCOUNTER — Encounter: Payer: Self-pay | Admitting: Podiatry

## 2023-12-17 ENCOUNTER — Other Ambulatory Visit: Payer: Self-pay

## 2023-12-17 DIAGNOSIS — L97522 Non-pressure chronic ulcer of other part of left foot with fat layer exposed: Secondary | ICD-10-CM

## 2023-12-17 DIAGNOSIS — I739 Peripheral vascular disease, unspecified: Secondary | ICD-10-CM

## 2023-12-17 DIAGNOSIS — I771 Stricture of artery: Secondary | ICD-10-CM

## 2023-12-18 NOTE — Progress Notes (Signed)
Subjective: Chief Complaint  Patient presents with   Foot Ulcer    Rm#13 Left toe ulcer follow up care.     78 year old male presents With above concerns.  States he is doing well.  Skin very minimal to no drainage coming from the toe.  The swelling and redness is much improved.  He states he feels 1 week of antibiotics left.  He does not report any fevers or chills.  He has no other concerns today.   Objective: AAO x3, NAD On the left second toe that there is still some mild edema present.  There is no ascending cellulitis.  There is hyperkeratotic tissue at the distal portion of toe with a central granular wound measuring 0.3 x 0.3 x 0.1 cm without any probing to bone, undermining or tunneling.  There is no fluctuation, crepitation.  There is no malodor.  Digital contractures are present.  Previous first, third toe amputations. No pain with calf compression, swelling, warmth, erythema  Assessment: Ulceration left second toe  Plan: -Treatment options discussed including all alternatives, risks, and complications -X-rays were obtained and reviewed of the left foot.  There is likely chronic changes present of the distal phalanx however it is difficult to fully evaluate given the digital contracture.  There is no soft tissue emphysema today. -I sharply debrided hyperkeratotic tissue at the distal portion of the toe utilizing the 312 scalpel to reveal the underlying central ulceration.  The pre and post wound measurements are noted to be about the same I debrided nonviable, devitalized tissue with a 312 scalpel to help facilitate healing.  I would still continue with dressing changes daily, offloading. -We had a discussion today in regards to his options.  He is not able to do surgery right now as his wife is recovering into clinic the toe is stable.  He continues to monitor for now but symptoms persist he needs to report to emergency room.  He is advised a course of antibiotics and we will see  how he does once antibiotics are completed. -Continue offloading -Monitor for any clinical signs or symptoms of infection and directed to call the office immediately should any occur or go to the ER.  Return in about 2 weeks (around 12/31/2023).  Vivi Barrack DPM

## 2023-12-21 DIAGNOSIS — J449 Chronic obstructive pulmonary disease, unspecified: Secondary | ICD-10-CM | POA: Diagnosis not present

## 2023-12-22 ENCOUNTER — Encounter: Payer: Self-pay | Admitting: Internal Medicine

## 2023-12-23 ENCOUNTER — Telehealth: Payer: Self-pay | Admitting: Internal Medicine

## 2023-12-23 ENCOUNTER — Other Ambulatory Visit: Payer: Self-pay | Admitting: Family Medicine

## 2023-12-23 MED ORDER — ALBUTEROL SULFATE HFA 108 (90 BASE) MCG/ACT IN AERS: 2.0000 | INHALATION_SPRAY | Freq: Four times a day (QID) | RESPIRATORY_TRACT | 5 refills | Status: DC | PRN

## 2023-12-23 NOTE — Telephone Encounter (Signed)
PT writes on MYCHART:  Albuterol Refill [facility] on [address]  It's for the Albuterol HFA 90 MCG/ACT inhaler.  Thank you so much!

## 2023-12-24 DIAGNOSIS — H401131 Primary open-angle glaucoma, bilateral, mild stage: Secondary | ICD-10-CM | POA: Diagnosis not present

## 2023-12-24 DIAGNOSIS — H04123 Dry eye syndrome of bilateral lacrimal glands: Secondary | ICD-10-CM | POA: Diagnosis not present

## 2023-12-25 MED ORDER — ALBUTEROL SULFATE HFA 108 (90 BASE) MCG/ACT IN AERS: 2.0000 | INHALATION_SPRAY | Freq: Four times a day (QID) | RESPIRATORY_TRACT | 3 refills | Status: AC | PRN

## 2023-12-25 NOTE — Telephone Encounter (Signed)
 -  Albuterol refilled.

## 2023-12-27 ENCOUNTER — Other Ambulatory Visit: Payer: Medicare Other

## 2023-12-27 ENCOUNTER — Encounter: Payer: Self-pay | Admitting: Urology

## 2023-12-27 ENCOUNTER — Ambulatory Visit
Admission: RE | Admit: 2023-12-27 | Discharge: 2023-12-27 | Disposition: A | Discharge status: Home / Self Care | Payer: Medicare Other | Source: Ambulatory Visit | Attending: Pain Medicine | Admitting: Pain Medicine

## 2023-12-27 DIAGNOSIS — T1590XA Foreign body on external eye, part unspecified, unspecified eye, initial encounter: Secondary | ICD-10-CM

## 2023-12-27 DIAGNOSIS — Z0189 Encounter for other specified special examinations: Secondary | ICD-10-CM | POA: Diagnosis not present

## 2023-12-30 DIAGNOSIS — J449 Chronic obstructive pulmonary disease, unspecified: Secondary | ICD-10-CM | POA: Diagnosis not present

## 2023-12-31 ENCOUNTER — Ambulatory Visit: Payer: Medicare Other | Admitting: Podiatry

## 2024-01-07 ENCOUNTER — Encounter: Payer: Self-pay | Admitting: Podiatry

## 2024-01-07 ENCOUNTER — Ambulatory Visit (INDEPENDENT_AMBULATORY_CARE_PROVIDER_SITE_OTHER): Payer: Medicare Other | Admitting: Podiatry

## 2024-01-07 DIAGNOSIS — L97522 Non-pressure chronic ulcer of other part of left foot with fat layer exposed: Secondary | ICD-10-CM | POA: Diagnosis not present

## 2024-01-08 ENCOUNTER — Telehealth: Payer: Self-pay | Admitting: Cardiovascular Disease

## 2024-01-08 NOTE — Telephone Encounter (Signed)
 STAT if HR is under 50 or over 120 (normal HR is 60-100 beats per minute)  What is your heart rate? 111  Do you have a log of your heart rate readings (document readings)? 109-110  Do you have any other symptoms? Pt gets dizzy when he stands up

## 2024-01-08 NOTE — Telephone Encounter (Signed)
 Spoke with [name] per DPR. [name] the pt was not with her at time of call. [name] has been having spikes in his HR the past 3/4 weeks. [name] states the pt gets dizzy sometimes when he stands but has not fallen nor felt so dizzy he can not walk. Denies knowledge of pt having any other symptoms.  Does not have record of BP or HR. Pt wanted a appointment. Appointment set with Dr Allyson Sabal for 01/15/24. ED/911 precautions reviewed and [name] stated understanding. Forwarding to Dr Allyson Sabal and nurse for review.

## 2024-01-10 NOTE — Progress Notes (Signed)
 Subjective: Chief Complaint  Patient presents with   Foot Ulcer    RM#13 Follow up on left foot ulcer patient states is doing well has no concerns at this time.     78 year old male presents With above concerns.  He has been doing well.  His continue with daily dressing changes and try and offload.  Reported any drainage or pus.  No present swelling or redness.  Does not report any fevers or chills.  No other concerns today.    Objective: AAO x3, NAD On the left second toe that there is still some mild edema present.  There is thick hyperkeratotic tissue at the distal aspect of the toe and upon debridement there is a skin fissure transversely there is a the distal portion of the toe measuring 0.8 x 0.1 x 0.1 cm.  Not able to measure the wound prior to debridement as it was covered with callus.  There is no probing, undermining or tunneling.  There is still some chronic edema present but no significant cellulitis.  There is no fluctuation or crepitation.  There is no malodor.  Digital contracture present.  Previous first, third toe amputations. No pain with calf compression, swelling, warmth, erythema  Assessment: Ulceration left second toe  Plan: -Treatment options discussed including all alternatives, risks, and complications -Medically necessary wound debridement was performed today.  I sharply debrided hyperkeratotic tissue at the distal portion of the toe utilizing the 312 scalpel to reveal the underlying central ulceration.  I was not able to measure the wound prior to debridement posterior measurements are noted above.  There is no bleeding.  Cleaned area with saline.  Antibiotic ointment was applied followed by dressing.  Continue offloading. -Again discussed surgical intervention.  He states that he does not feel the surgery at this time however we will continue monitor closely for signs or symptoms of worsening or reoccurrence of infection and should any occur he will likely need to  proceed with transmetatarsal amputation at that time.  He is well aware of this.  -Monitor for any clinical signs or symptoms of infection and directed to call the office immediately should any occur or go to the ER.  Return in about 3 weeks (around 01/28/2024) for toe ulcer, x-ray.  Vivi Barrack DPM

## 2024-01-15 ENCOUNTER — Ambulatory Visit (INDEPENDENT_AMBULATORY_CARE_PROVIDER_SITE_OTHER)

## 2024-01-15 ENCOUNTER — Encounter: Payer: Self-pay | Admitting: Cardiovascular Disease

## 2024-01-15 ENCOUNTER — Ambulatory Visit: Attending: Cardiovascular Disease | Admitting: Cardiovascular Disease

## 2024-01-15 VITALS — BP 138/76 | HR 58 | Ht 69.0 in | Wt 257.4 lb

## 2024-01-15 DIAGNOSIS — E1169 Type 2 diabetes mellitus with other specified complication: Secondary | ICD-10-CM

## 2024-01-15 DIAGNOSIS — E782 Mixed hyperlipidemia: Secondary | ICD-10-CM

## 2024-01-15 DIAGNOSIS — E785 Hyperlipidemia, unspecified: Secondary | ICD-10-CM | POA: Diagnosis not present

## 2024-01-15 DIAGNOSIS — I7143 Infrarenal abdominal aortic aneurysm, without rupture: Secondary | ICD-10-CM

## 2024-01-15 DIAGNOSIS — R000 Tachycardia, unspecified: Secondary | ICD-10-CM | POA: Diagnosis not present

## 2024-01-15 DIAGNOSIS — I771 Stricture of artery: Secondary | ICD-10-CM

## 2024-01-15 DIAGNOSIS — R0609 Other forms of dyspnea: Secondary | ICD-10-CM

## 2024-01-15 DIAGNOSIS — I739 Peripheral vascular disease, unspecified: Secondary | ICD-10-CM | POA: Diagnosis not present

## 2024-01-15 DIAGNOSIS — I701 Atherosclerosis of renal artery: Secondary | ICD-10-CM

## 2024-01-15 DIAGNOSIS — I10 Essential (primary) hypertension: Secondary | ICD-10-CM | POA: Diagnosis not present

## 2024-01-15 NOTE — Assessment & Plan Note (Signed)
 History of renal artery stenosis by angiography 5 years ago 80% left renal artery stenosis however recent renal Dopplers performed 05/25/2022 did not confirm this.  His left renal aortic ratio was 2.6.

## 2024-01-15 NOTE — Assessment & Plan Note (Signed)
 History of hyperlipidemia on high-dose atorvastatin and Zetia with lipid profile performed 09/19/2023 revealing total cholesterol 122, LDL 73 and HDL 28.

## 2024-01-15 NOTE — Assessment & Plan Note (Signed)
 History of hyperlipidemia on high-dose statin therapy and Zetia with lipid profile performed 09/19/2023 revealing total cholesterol 122, LDL 73 and HDL 20.

## 2024-01-15 NOTE — Assessment & Plan Note (Signed)
 Patient has noticed increased heart rate with minimal exertion since I last saw him.  It is unclear what this rhythm represents.  I am going to get a 2-week Zio patch to further evaluate.

## 2024-01-15 NOTE — Assessment & Plan Note (Signed)
 History of abdominal aortic aneurysm recently assessed 11/07/2023 measuring 44 mm followed by Dr. Chestine Spore.

## 2024-01-15 NOTE — Assessment & Plan Note (Signed)
 Status post kissing balloon bilateral common iliac artery VBX stenting by Dr. Chestine Spore 05/28/2019.  He recently had angiography performed by Dr. Karin Lieu 09/18/2023 revealing these to be widely patent.  He performed shockwave angioplasty of a left popliteal artery stenosis.  The patient does have critical of ischemia status post amputation of his left great toe and third toe with an ischemic wound on the tip of his left second toe.  There is question of whether or not he may require a "TMA".

## 2024-01-15 NOTE — Patient Instructions (Signed)
 Medication Instructions:  Your physician recommends that you continue on your current medications as directed. Please refer to the Current Medication list given to you today.  *If you need a refill on your cardiac medications before your next appointment, please call your pharmacy*   Testing/Procedures: Your physician has requested that you have an echocardiogram. Echocardiography is a painless test that uses sound waves to create images of your heart. It provides your doctor with information about the size and shape of your heart and how well your heart's chambers and valves are working. This procedure takes approximately one hour. There are no restrictions for this procedure. Please do NOT wear cologne, perfume, aftershave, or lotions (deodorant is allowed). Please arrive 15 minutes prior to your appointment time.  Please note: We ask at that you not bring children with you during ultrasound (echo/ vascular) testing. Due to room size and safety concerns, children are not allowed in the ultrasound rooms during exams. Our front office staff cannot provide observation of children in our lobby area while testing is being conducted. An adult accompanying a patient to their appointment will only be allowed in the ultrasound room at the discretion of the ultrasound technician under special circumstances. We apologize for any inconvenience.   Your physician has requested that you have a renal artery duplex. During this test, an ultrasound is used to evaluate blood flow to the kidneys. Take your medications as you usually do. This will take place at [address] [facility], [address].  No food after 11PM the night before.  Water is OK. (Don't drink liquids if you have been instructed not to for ANOTHER test). Avoid foods that produce bowel gas, for 24 hours prior to exam (see below). No breakfast, no chewing gum, no smoking or carbonated beverages. Patient may take morning medications with water. Come in for  test at least 15 minutes early to register.  Please note: We ask at that you not bring children with you during ultrasound (echo/ vascular) testing. Due to room size and safety concerns, children are not allowed in the ultrasound rooms during exams. Our front office staff cannot provide observation of children in our lobby area while testing is being conducted. An adult accompanying a patient to their appointment will only be allowed in the ultrasound room at the discretion of the ultrasound technician under special circumstances. We apologize for any inconvenience.    Dr. Allyson Sabal has ordered a CT coronary calcium score.   Test locations:  [facility]  [facility]  This is $99 out of pocket.   Coronary CalciumScan A coronary calcium scan is an imaging test used to look for deposits of calcium and other fatty materials (plaques) in the inner lining of the blood vessels of the heart (coronary arteries). These deposits of calcium and plaques can partly clog and narrow the coronary arteries without producing any symptoms or warning signs. This puts a person at risk for a heart attack. This test can detect these deposits before symptoms develop. Tell a health care provider about: Any allergies you have. All medicines you are taking, including vitamins, herbs, eye drops, creams, and over-the-counter medicines. Any problems you or family members have had with anesthetic medicines. Any blood disorders you have. Any surgeries you have had. Any medical conditions you have. Whether you are pregnant or may be pregnant. What are the risks? Generally, this is a safe procedure. However, problems may occur, including:  Harm to a pregnant woman and her unborn baby. This test involves the use of radiation. Radiation exposure can be dangerous to a pregnant woman and her unborn baby. If you are pregnant, you generally  should not have this procedure done. Slight increase in the risk of cancer. This is because of the radiation involved in the test. What happens before the procedure? No preparation is needed for this procedure. What happens during the procedure? You will undress and remove any jewelry around your neck or chest. You will put on a hospital gown. Sticky electrodes will be placed on your chest. The electrodes will be connected to an electrocardiogram (ECG) machine to record a tracing of the electrical activity of your heart. A CT scanner will take pictures of your heart. During this time, you will be asked to lie still and hold your breath for 2-3 seconds while a picture of your heart is being taken. The procedure may vary among health care providers and hospitals. What happens after the procedure? You can get dressed. You can return to your normal activities. It is up to you to get the results of your test. Ask your health care provider, or the department that is doing the test, when your results will be ready. Summary A coronary calcium scan is an imaging test used to look for deposits of calcium and other fatty materials (plaques) in the inner lining of the blood vessels of the heart (coronary arteries). Generally, this is a safe procedure. Tell your health care provider if you are pregnant or may be pregnant. No preparation is needed for this procedure. A CT scanner will take pictures of your heart. You can return to your normal activities after the scan is done. This information is not intended to replace advice given to you by your health care provider. Make sure you discuss any questions you have with your health care provider. Document Released: 04/19/2008 Document Revised: 09/10/2016 Document Reviewed: 09/10/2016 Elsevier Interactive Patient Education  2017 Elsevier Inc.   Christena Deem- Long Term Monitor Instructions  Your physician has requested you wear a ZIO patch monitor for 14 days.   This is a single patch monitor. [employer] supplies one patch monitor per enrollment. Additional stickers are not available. Please do not apply patch if you will be having a Nuclear Stress Test,  Echocardiogram, Cardiac CT, MRI, or Chest Xray during the period you would be wearing the  monitor. The patch cannot be worn during these tests. You cannot remove and re-apply the  ZIO XT patch monitor.  Your ZIO patch monitor will be mailed 3 day USPS to your address on file. It may take 3-5 days  to receive your monitor after you have been enrolled.  Once you have received your monitor, please review the enclosed instructions. Your monitor  has already been registered assigning a specific monitor serial # to you.  Billing and Patient Assistance Program Information  We have supplied [employer] with any of your insurance information on file for billing purposes. [employer] offers a sliding scale Patient Assistance Program for patients that do not have  insurance, or whose insurance does not completely cover the cost of the ZIO monitor.  You must apply for the Patient Assistance Program to qualify for this discounted rate.  To apply, please call [employer] at [phone], select option 4, select option 2, ask to apply for  Patient Assistance Program. Meredeth Ide will ask your household income, and how many people  are in your household. They will  quote your out-of-pocket cost based on that information.  [employer] will also be able to set up a 11-month, interest-free payment plan if needed.  Applying the monitor   Shave hair from upper left chest.  Hold abrader disc by orange tab. Rub abrader in 40 strokes over the upper left chest as  indicated in your monitor instructions.  Clean area with 4 enclosed alcohol pads. Let dry.  Apply patch as indicated in monitor instructions. Patch will be placed under collarbone on left  side of chest with arrow pointing upward.  Rub patch adhesive wings for 2 minutes. Remove  white label marked "1". Remove the white  label marked "2". Rub patch adhesive wings for 2 additional minutes.  While looking in a mirror, press and release button in center of patch. A small green light will  flash 3-4 times. This will be your only indicator that the monitor has been turned on.  Do not shower for the first 24 hours. You may shower after the first 24 hours.  Press the button if you feel a symptom. You will hear a small click. Record Date, Time and  Symptom in the Patient Logbook.  When you are ready to remove the patch, follow instructions on the last 2 pages of Patient  Logbook. Stick patch monitor onto the last page of Patient Logbook.  Place Patient Logbook in the blue and white box. Use locking tab on box and tape box closed  securely. The blue and white box has prepaid postage on it. Please place it in the mailbox as  soon as possible. Your physician should have your test results approximately 7 days after the  monitor has been mailed back to [facility].  Call [facility] Customer Care at [phone] if you have questions regarding  your ZIO XT patch monitor. Call them immediately if you see an orange light blinking on your  monitor.  If your monitor falls off in less than 4 days, contact our Monitor department at [phone].  If your monitor becomes loose or falls off after 4 days call [employer] at [phone] for  suggestions on securing your monitor     Follow-Up: At [facility], you and your health needs are our priority.  As part of our continuing mission to provide you with exceptional heart care, we have created designated Provider Care Teams.  These Care Teams include your primary Cardiologist (physician) and Advanced Practice Providers (APPs -  Physician Assistants and Nurse Practitioners) who all work together to provide you with the care you need, when you need it.  We recommend signing up for the patient portal called "MyChart".  Sign  up information is provided on this After Visit Summary.  MyChart is used to connect with patients for Virtual Visits (Telemedicine).  Patients are able to view lab/test results, encounter notes, upcoming appointments, etc.  Non-urgent messages can be sent to your provider as well.   To learn more about what you can do with MyChart, go to [email or web address].    Your next appointment:   3 month(s)  Provider:   Robet Leu, PA-C, Azalee Course, PA-C, Bernadene Person, NP, or Reather Littler, NP       Then, Nanetta Batty, MD will plan to see you again in 12 month(s).    Other Instructions   1st Floor: - Lobby - Registration  - Pharmacy  - Lab - Cafe  2nd Floor: - PV Lab - Diagnostic Testing (echo, CT, nuclear med)  3rd Floor: - Vacant  4th Floor: - TCTS (cardiothoracic surgery) - AFib Clinic - Structural Heart Clinic - Vascular Surgery  - Vascular Ultrasound  5th Floor: - HeartCare Cardiology (general and EP) - Clinical Pharmacy for coumadin, hypertension, lipid, weight-loss medications, and med management appointments    Valet parking services will be available as well.

## 2024-01-15 NOTE — Progress Notes (Signed)
 01/15/2024 [name]   [birth year]  [id number]  Primary Physician Donald Canary, MD Primary Cardiologist: Donald Gess MD FACP, [name], [name], [name]  HPI:  [name] is a 78 y.o.  moderately overweight married Caucasian male father of 3, grandfather of 6 grandchildren who I last saw in the office 08/13/2023.  He is accompanied by his wife [name] today.  He was referred by Dr. Abner Park for peripheral vascular evaluation because of a recent CTA that showed a 4 cm abdominal aortic aneurysm and left iliac stenosis.  He does have a history of 60-pack-year tobacco abuse having quit 2008.  He also has treated hypertension, diabetes and hyperlipidemia.  He had a stroke back in 2007 but is never had a heart attack.  Does complain of some dyspnea probably related to COPD but denies chest pain.  He has a known abdominal aortic aneurysm, renal artery stenosis and left iliac stenosis.  He has also had bilateral carotid endarterectomies remotely.  Abdominal ultrasound performed 10/01/2016 did show a moderately severe left common iliac artery stenosis with a aortic dimension of 3.5 x 3.7 cm.  Recent CTA performed 09/18/2018 revealed an aortic dimension of 3.7 x 4 cm with what appeared to be a left common iliac artery stenosis.  Does have some left lower extremity discomfort with exercise consistent with claudication but this does not appear to be lifestyle limiting at this time.   He was admitted January 2021 with COVID-pneumonia which she recovered from.  He also had critical limb ischemia and underwent left great toe amputation with "kissing balloon" stenting using [name] stents by [name] 05/28/2019.    Lower extremity Dopplers performed on 03/17/2021 revealed widely patent stents with normal ABIs bilaterally.   Since I saw him in the office 6 months ago he has noticed increasing heart rate with minimal exertion.  He also had a peripheral angiogram performed by [name] and had shockwave angioplasty of  the P2 segment of the left popliteal artery.  He has had amputation of his left great toe and third toe with nail critical of ischemia bobbing his left second toe with questions of whether or not to pursue TMA.  He has had increasing dyspnea exertion but denies chest pain.   Current Meds  Medication Sig   albuterol (PROVENTIL) (2.5 MG/3ML) 0.083% nebulizer solution Take 3 mLs (2.5 mg total) by nebulization every 6 (six) hours as needed for wheezing or shortness of breath.   albuterol (VENTOLIN HFA) 108 (90 Base) MCG/ACT inhaler Inhale 2 puffs into the lungs every 6 (six) hours as needed.   ALPRAZolam (XANAX) 0.5 MG tablet Take 1 tablet (0.5 mg total) by mouth 2 (two) times daily as needed for anxiety.   arformoterol (BROVANA) 15 MCG/2ML NEBU Take 2 mLs (15 mcg total) by nebulization daily.   Ascorbic Acid (VITAMIN C) 1000 MG tablet Take 1,000 mg by mouth daily.   aspirin EC 81 MG tablet Take 81 mg by mouth at bedtime.   atorvastatin (LIPITOR) 80 MG tablet TAKE 1 TABLET BY MOUTH EVERY DAY   BD PEN NEEDLE NANO 2ND GEN 32G X 4 MM MISC USE AS DIRECTED WITH HUMALOG PEN   BIOTIN PO Take 1 capsule by mouth daily.   Blood Glucose Monitoring Suppl (ACCU-CHEK AVIVA PLUS) w/Device KIT Check blood sugars three times daily   budesonide (PULMICORT) 0.25 MG/2ML nebulizer solution Take 2 mLs (0.25 mg total) by nebulization daily.   carvedilol (COREG) 25 MG tablet TAKE  ONE-HALF TABLET BY  MOUTH TWICE DAILY WITH  MEALS   cholecalciferol (VITAMIN D) 1000 UNITS tablet Take 1,000 Units by mouth daily.   clopidogrel (PLAVIX) 75 MG tablet Take 1 tablet (75 mg total) by mouth daily.   ezetimibe (ZETIA) 10 MG tablet TAKE 1 TABLET BY MOUTH EVERY DAY   FLUoxetine (PROZAC) 40 MG capsule Take 1 capsule (40 mg total) by mouth daily.   glimepiride (AMARYL) 2 MG tablet Take 1 tablet (2 mg total) by mouth 2 (two) times daily.   glucose blood (ACCU-CHEK GUIDE TEST) test strip Check blood sugars 3 times daily   insulin lispro  (HUMALOG KWIKPEN) 100 UNIT/ML KwikPen Before each meal 3 times a day, 140-199 - 3 units, 200-250 - 6 units, 251-299 - 8 units,  300-349 - 10 units,  350 or above 12 units. Insulin PEN if approved, provide syringes and needles if needed.   Lancets (ACCU-CHEK MULTICLIX) lancets Check blood sugars three times daily   Latanoprost 0.005 % EMUL Place 1 drop into both eyes in the morning and at bedtime.   levothyroxine (SYNTHROID) 25 MCG tablet Take 1 tablet (25 mcg total) by mouth daily before breakfast.   losartan (COZAAR) 50 MG tablet Take 1 tablet (50 mg total) by mouth daily.   Magnesium 500 MG TABS Take 500 mg by mouth daily.   memantine (NAMENDA) 5 MG tablet Take 1 tablet (5 mg total) by mouth 2 (two) times daily.   Multiple Vitamin (MULTIVITAMIN) tablet Take 1 tablet by mouth daily.   Omega-3 Fatty Acids (FISH OIL) 1000 MG CAPS Take 1,000 mg by mouth 2 (two) times daily.   pioglitazone (ACTOS) 30 MG tablet Take 1 tablet (30 mg total) by mouth daily.   povidone-iodine (BETADINE) 10 % ointment Apply 1 Application topically 3 (three) times a week.   pregabalin (LYRICA) 150 MG capsule TAKE 1 CAPSULE BY MOUTH TWICE A DAY   vitamin B-12 (CYANOCOBALAMIN) 1000 MCG tablet Take 1,000 mcg by mouth See admin instructions. Take one tablet by mouth twice weekly on Monday and Thursday per wife   zinc gluconate 50 MG tablet Take 50 mg by mouth daily.     Allergies  Allergen Reactions   Metformin Diarrhea   Prednisone Itching    Social History   Socioeconomic History   Marital status: Married    Spouse name: [name]   Number of children: 3   Years of education: 12   Highest education level: 12th grade  Occupational History   Occupation: Maintenance    Comment: Primary Care at [employer]  Tobacco Use   Smoking status: Former    Current packs/day: 0.00    Average packs/day: 2.0 packs/day for 40.0 years (80.0 ttl pk-yrs)    Types: Cigarettes, Cigars    Start date: 05/04/1966    Quit date: 05/04/2006     Years since quitting: 17.7   Smokeless tobacco: Never  Vaping Use   Vaping status: Never Used  Substance and Sexual Activity   Alcohol use: Not Currently    Alcohol/week: 0.0 standard drinks of alcohol    Comment: rare - 11/10/2012 "quit > 20 yr ago"   Drug use: No   Sexual activity: Not Currently  Other Topics Concern   Not on file  Social History Narrative   Lives with wife (1993), no pets   Grown children.   Occupation: retired, Music therapist, Office manager at [employer])   Activity: golf, gardening    Diet: good water, fruits/vegetables daily   Right handed  One story home   Drinks caffeine prn   Social Drivers of Health   Financial Resource Strain: Medium Risk (11/12/2023)   Overall Financial Resource Strain (CARDIA)    Difficulty of Paying Living Expenses: Somewhat hard  Food Insecurity: Food Insecurity Present (11/12/2023)   Hunger Vital Sign    Worried About Running Out of Food in the Last Year: Never true    Ran Out of Food in the Last Year: Sometimes true  Transportation Needs: No Transportation Needs (11/12/2023)   PRAPARE - Administrator, Civil Service (Medical): No    Lack of Transportation (Non-Medical): No  Physical Activity: Inactive (11/12/2023)   Exercise Vital Sign    Days of Exercise per Week: 0 days    Minutes of Exercise per Session: 0 min  Stress: Stress Concern Present (11/12/2023)   [employer] of Occupational Health - Occupational Stress Questionnaire    Feeling of Stress : To some extent  Social Connections: Socially Isolated (11/12/2023)   Social Connection and Isolation Panel [NHANES]    Frequency of Communication with Friends and Family: Once a week    Frequency of Social Gatherings with Friends and Family: Never    Attends Religious Services: Never    Database administrator or Organizations: No    Attends Banker Meetings: Never    Marital Status: Married  Catering manager Violence: Not At Risk (09/23/2023)    Humiliation, Afraid, Rape, and Kick questionnaire    Fear of Current or Ex-Partner: No    Emotionally Abused: No    Physically Abused: No    Sexually Abused: No     Review of Systems: General: negative for chills, fever, night sweats or weight changes.  Cardiovascular: negative for chest pain, dyspnea on exertion, edema, orthopnea, palpitations, paroxysmal nocturnal dyspnea or shortness of breath Dermatological: negative for rash Respiratory: negative for cough or wheezing Urologic: negative for hematuria Abdominal: negative for nausea, vomiting, diarrhea, bright red blood per rectum, melena, or hematemesis Neurologic: negative for visual changes, syncope, or dizziness All other systems reviewed and are otherwise negative except as noted above.    Blood pressure 138/76, pulse (!) 58, height 5\' 9"  (1.753 m), weight 257 lb 6.4 oz (116.8 kg), SpO2 91%.  General appearance: alert and no distress Neck: no adenopathy, no JVD, supple, symmetrical, trachea midline, thyroid not enlarged, symmetric, no tenderness/mass/nodules, and soft right carotid bruit Lungs: clear to auscultation bilaterally Heart: regular rate and rhythm, S1, S2 normal, no murmur, click, rub or gallop Extremities: Ulcer tip of left second toe Pulses: Decreased Skin: Skin color, texture, turgor normal. No rashes or lesions Neurologic: Grossly normal  EKG EKG Interpretation Date/Time:  Wednesday January 15 2024 09:15:48 EDT Ventricular Rate:  56 PR Interval:  188 QRS Duration:  146 QT Interval:  476 QTC Calculation: 459 R Axis:   109  Text Interpretation: Sinus bradycardia Right bundle branch block When compared with ECG of 13-Aug-2023 10:24, No significant change was found Confirmed by Nanetta Batty [phone]) on 01/15/2024 9:33:54 AM    ASSESSMENT AND PLAN:   Hyperlipidemia History of hyperlipidemia on high-dose atorvastatin and Zetia with lipid profile performed 09/19/2023 revealing total cholesterol 122, LDL 73  and HDL 28.  Hypertension History of essential hypertension her blood pressure measured today at 168/84.  He is on carvedilol and losartan.  AAA (abdominal aortic aneurysm) without rupture History of abdominal aortic aneurysm recently assessed 11/07/2023 measuring 44 mm followed by [name].  Renal artery stenosis History of  renal artery stenosis by angiography 5 years ago 80% left renal artery stenosis however recent renal Dopplers performed 05/25/2022 did not confirm this.  His left renal aortic ratio was 2.6.  Iliac artery stenosis, left Status post kissing balloon bilateral common iliac artery [name] stenting by [name] 05/28/2019.  He recently had angiography performed by [name] 09/18/2023 revealing these to be widely patent.  He performed shockwave angioplasty of a left popliteal artery stenosis.  The patient does have critical of ischemia status post amputation of his left great toe and third toe with an ischemic wound on the tip of his left second toe.  There is question of whether or not he may require a "TMA".  Hyperlipidemia associated with type 2 diabetes mellitus History of hyperlipidemia on high-dose statin therapy and Zetia with lipid profile performed 09/19/2023 revealing total cholesterol 122, LDL 73 and HDL 20.  Dyspnea This apparently has gotten worse over time.  He does have a history of tobacco abuse having smoked 60 pack years and quit back in 2008.  His last 2D echo performed 11/22 revealed normal LV systolic function without valvular abnormalities.  I am going to repeat a 2D echocardiogram as well as obtain a coronary calcium score to her stratify him.  Rapid heart rate Patient has noticed increased heart rate with minimal exertion since I last saw him.  It is unclear what this rhythm represents.  I am going to get a 2-week Zio patch to further evaluate.     Donald Gess MD FACP,FACC,FAHA, [facility] 01/15/2024 9:52 AM

## 2024-01-15 NOTE — Assessment & Plan Note (Signed)
 This apparently has gotten worse over time.  He does have a history of tobacco abuse having smoked 60 pack years and quit back in 2008.  His last 2D echo performed 11/22 revealed normal LV systolic function without valvular abnormalities.  I am going to repeat a 2D echocardiogram as well as obtain a coronary calcium score to her stratify him.

## 2024-01-15 NOTE — Assessment & Plan Note (Addendum)
 History of essential hypertension her blood pressure measured today at 168/84.  Repeat blood pressure at the end of the appointment was 138/76.  He is on carvedilol and losartan.

## 2024-01-15 NOTE — Progress Notes (Unsigned)
 Enrolled for [employer] to mail a ZIO XT long term holter monitor to the patients address on file.

## 2024-01-17 ENCOUNTER — Ambulatory Visit
Admission: RE | Admit: 2024-01-17 | Discharge: 2024-01-17 | Disposition: A | Discharge status: Home / Self Care | Payer: Medicare Other | Source: Ambulatory Visit | Attending: Pain Medicine | Admitting: Pain Medicine

## 2024-01-17 DIAGNOSIS — M5416 Radiculopathy, lumbar region: Secondary | ICD-10-CM | POA: Diagnosis not present

## 2024-01-17 DIAGNOSIS — M5414 Radiculopathy, thoracic region: Secondary | ICD-10-CM | POA: Diagnosis not present

## 2024-01-17 MED ORDER — GADOPICLENOL 0.5 MMOL/ML IV SOLN
10.0000 mL | Freq: Once | INTRAVENOUS | Status: AC | PRN
  Administered 2024-01-17: 10 mL via INTRAVENOUS

## 2024-01-18 DIAGNOSIS — J449 Chronic obstructive pulmonary disease, unspecified: Secondary | ICD-10-CM | POA: Diagnosis not present

## 2024-01-19 ENCOUNTER — Encounter: Payer: Self-pay | Admitting: Family Medicine

## 2024-01-20 MED ORDER — ALPRAZOLAM 0.5 MG PO TABS: 0.5000 mg | ORAL_TABLET | Freq: Two times a day (BID) | ORAL | 2 refills | Status: DC | PRN

## 2024-01-20 NOTE — Telephone Encounter (Signed)
 Requesting: xanax Contract:12/12/23 UDS:12/12/23 Last Visit:11/19/23 Next Visit:02/18/24 Last Refill:07/30/23  Please Advise

## 2024-01-24 ENCOUNTER — Encounter: Payer: Self-pay | Admitting: Family Medicine

## 2024-01-27 ENCOUNTER — Ambulatory Visit (HOSPITAL_BASED_OUTPATIENT_CLINIC_OR_DEPARTMENT_OTHER)
Admission: RE | Admit: 2024-01-27 | Discharge: 2024-01-27 | Disposition: A | Discharge status: Home / Self Care | Payer: Self-pay | Source: Ambulatory Visit | Attending: Cardiovascular Disease | Admitting: Cardiovascular Disease

## 2024-01-27 DIAGNOSIS — J449 Chronic obstructive pulmonary disease, unspecified: Secondary | ICD-10-CM | POA: Diagnosis not present

## 2024-01-27 DIAGNOSIS — R000 Tachycardia, unspecified: Secondary | ICD-10-CM | POA: Insufficient documentation

## 2024-01-27 DIAGNOSIS — R0609 Other forms of dyspnea: Secondary | ICD-10-CM | POA: Insufficient documentation

## 2024-01-27 DIAGNOSIS — I10 Essential (primary) hypertension: Secondary | ICD-10-CM | POA: Insufficient documentation

## 2024-01-27 DIAGNOSIS — E782 Mixed hyperlipidemia: Secondary | ICD-10-CM | POA: Insufficient documentation

## 2024-01-28 ENCOUNTER — Encounter: Payer: Self-pay | Admitting: Family Medicine

## 2024-01-28 ENCOUNTER — Ambulatory Visit: Admitting: Podiatry

## 2024-01-28 ENCOUNTER — Telehealth: Payer: Self-pay

## 2024-01-28 ENCOUNTER — Ambulatory Visit (INDEPENDENT_AMBULATORY_CARE_PROVIDER_SITE_OTHER)

## 2024-01-28 ENCOUNTER — Encounter (HOSPITAL_COMMUNITY): Payer: Self-pay | Admitting: Cardiovascular Disease

## 2024-01-28 ENCOUNTER — Encounter: Payer: Self-pay | Admitting: Podiatry

## 2024-01-28 ENCOUNTER — Other Ambulatory Visit (HOSPITAL_BASED_OUTPATIENT_CLINIC_OR_DEPARTMENT_OTHER): Payer: Self-pay

## 2024-01-28 ENCOUNTER — Other Ambulatory Visit (INDEPENDENT_AMBULATORY_CARE_PROVIDER_SITE_OTHER)

## 2024-01-28 DIAGNOSIS — E039 Hypothyroidism, unspecified: Secondary | ICD-10-CM

## 2024-01-28 DIAGNOSIS — L97522 Non-pressure chronic ulcer of other part of left foot with fat layer exposed: Secondary | ICD-10-CM

## 2024-01-28 DIAGNOSIS — R0609 Other forms of dyspnea: Secondary | ICD-10-CM

## 2024-01-28 DIAGNOSIS — E1149 Type 2 diabetes mellitus with other diabetic neurological complication: Secondary | ICD-10-CM

## 2024-01-28 DIAGNOSIS — R931 Abnormal findings on diagnostic imaging of heart and coronary circulation: Secondary | ICD-10-CM

## 2024-01-28 LAB — COMPREHENSIVE METABOLIC PANEL
ALT: 24 U/L (ref 0–53)
AST: 20 U/L (ref 0–37)
Albumin: 4.2 g/dL (ref 3.5–5.2)
Alkaline Phosphatase: 91 U/L (ref 39–117)
BUN: 17 mg/dL (ref 6–23)
CO2: 33 meq/L — ABNORMAL HIGH (ref 19–32)
Calcium: 9.3 mg/dL (ref 8.4–10.5)
Chloride: 100 meq/L (ref 96–112)
Creatinine, Ser: 0.87 mg/dL (ref 0.40–1.50)
GFR: 83.85 mL/min (ref >60.00)
Glucose, Bld: 134 mg/dL — ABNORMAL HIGH (ref 70–99)
Potassium: 4.9 meq/L (ref 3.5–5.1)
Sodium: 139 meq/L (ref 135–145)
Total Bilirubin: 0.4 mg/dL (ref 0.2–1.2)
Total Protein: 7.1 g/dL (ref 6.0–8.3)

## 2024-01-28 LAB — HEMOGLOBIN A1C: Hgb A1c MFr Bld: 6.7 % — ABNORMAL HIGH (ref 4.6–6.5)

## 2024-01-28 NOTE — Telephone Encounter (Signed)
 Spoke with the patient, detailed instructions given. He stated that he would be here for his test. Asked to call back with any questions. S.Melrose Kearse CCT

## 2024-01-30 NOTE — Progress Notes (Signed)
 Subjective: Chief Complaint  Patient presents with   Foot Ulcer    RM#12 Left foot toe ulcer patient states no change.Not in any pain or discomfort.     78 year old male presents with the above concerns.  He has been doing well. He has not see any drainage or pus.  His wife still helps with dressing changes and have been applying Iodosorb. No drainage or pus.  No increase in swelling or redness. No other concerns.    Objective: AAO x3, NAD On the left second toe that there is still some mild edema present but seems to be improved.  Hyperkeratotic tissue noted distal aspect the toe partial granular wound present measuring 0.4 x 0.2 x 0.1 cm without any probing, undermining or tunneling.  There is no fluctuation or crepitation. No malodor.  Digital Foley noted.  Previous hallux, third toe amputation. No pain with calf compression, swelling, warmth, erythema  Assessment: Ulceration left second toe  Plan: -Treatment options discussed including all alternatives, risks, and complications -X-rays obtained reviewed.  Chronic changes present second toe but is no evidence of acute fracture.  There is no soft tissue emphysema. -Medically necessary wound debridement was performed today.  I sharply debrided hyperkeratotic tissue at the distal portion of the toe utilizing the 312 scalpel to reveal the underlying central ulceration.  I was not able to measure the wound prior to debridement posterior measurements are noted above.  There is no bleeding.  Cleaned area with saline.  Vitasorb ointment was applied followed by dressing.  Continue offloading. -Will likely plan for transmetatarsal potation at some point but currently appears to be stable.  Given other issues he wants to hold off on surgery for now we will continue to monitor closely.   Vivi Barrack DPM

## 2024-02-04 ENCOUNTER — Ambulatory Visit (HOSPITAL_COMMUNITY): Attending: Cardiovascular Disease

## 2024-02-04 DIAGNOSIS — R0609 Other forms of dyspnea: Secondary | ICD-10-CM

## 2024-02-04 DIAGNOSIS — R931 Abnormal findings on diagnostic imaging of heart and coronary circulation: Secondary | ICD-10-CM

## 2024-02-04 LAB — MYOCARDIAL PERFUSION IMAGING
LV dias vol: 102 mL (ref 62–150)
LV sys vol: 54 mL
Nuc Stress EF: 48 %
Peak HR: 68 {beats}/min
Rest HR: 54 {beats}/min
Rest Nuclear Isotope Dose: 10.3 mCi
SDS: 1
SRS: 0
SSS: 1
ST Depression (mm): 0 mm
Stress Nuclear Isotope Dose: 30.9 mCi
TID: 1.08

## 2024-02-04 MED ORDER — TECHNETIUM TC 99M TETROFOSMIN IV KIT
10.3000 | PACK | Freq: Once | INTRAVENOUS | Status: AC | PRN
Start: 2024-02-04 — End: 2024-02-04
  Administered 2024-02-04: 10.3 via INTRAVENOUS

## 2024-02-04 MED ORDER — REGADENOSON 0.4 MG/5ML IV SOLN
0.4000 mg | Freq: Once | INTRAVENOUS | Status: AC
  Administered 2024-02-04: 0.4 mg via INTRAVENOUS

## 2024-02-04 MED ORDER — TECHNETIUM TC 99M TETROFOSMIN IV KIT
30.9000 | PACK | Freq: Once | INTRAVENOUS | Status: AC | PRN
  Administered 2024-02-04: 30.9 via INTRAVENOUS

## 2024-02-10 ENCOUNTER — Encounter: Payer: Self-pay | Admitting: Family Medicine

## 2024-02-10 ENCOUNTER — Other Ambulatory Visit: Payer: Self-pay | Admitting: Internal Medicine

## 2024-02-10 MED ORDER — ATORVASTATIN CALCIUM 80 MG PO TABS: 80.0000 mg | ORAL_TABLET | Freq: Every day | ORAL | 0 refills | Status: DC

## 2024-02-11 ENCOUNTER — Encounter: Payer: Self-pay | Admitting: Internal Medicine

## 2024-02-11 ENCOUNTER — Ambulatory Visit: Payer: Medicare Other | Admitting: Internal Medicine

## 2024-02-11 VITALS — BP 106/58 | HR 68 | Ht 69.0 in | Wt 255.0 lb

## 2024-02-11 DIAGNOSIS — G4733 Obstructive sleep apnea (adult) (pediatric): Secondary | ICD-10-CM | POA: Diagnosis not present

## 2024-02-11 DIAGNOSIS — J439 Emphysema, unspecified: Secondary | ICD-10-CM | POA: Diagnosis not present

## 2024-02-11 DIAGNOSIS — J4489 Other specified chronic obstructive pulmonary disease: Secondary | ICD-10-CM | POA: Diagnosis not present

## 2024-02-11 DIAGNOSIS — J9611 Chronic respiratory failure with hypoxia: Secondary | ICD-10-CM | POA: Diagnosis not present

## 2024-02-11 DIAGNOSIS — R000 Tachycardia, unspecified: Secondary | ICD-10-CM | POA: Diagnosis not present

## 2024-02-11 NOTE — Patient Instructions (Signed)
 It was a pleasure to see you today!  Please schedule follow up with myself in 6 months.  If my schedule is not open yet, we will contact you with a reminder closer to that time. Please call [phone] if you haven't heard from [name] a month before, and always call us sooner if issues or concerns arise. You can also send [name] a message through MyChart, but but aware that this is not to be used for urgent issues and it may take up to 5-7 days to receive a reply. Please be aware that you will likely be able to view your results before I have a chance to respond to them. Please give [name] 5 business days to respond to any non-urgent results.   Continue the budesonide nebulizer treatment once daily.  Use the afomoterol nebulizer treatment once daily.   Take the albuterol rescue inhaler or nebulizer treatment every 4 to 6 hours as needed for wheezing or shortness of breath. You can also take it 15 minutes before exercise or exertional activity.  Please wear oxygen for goal saturations over 88%.
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
## 2024-02-12 DIAGNOSIS — R000 Tachycardia, unspecified: Secondary | ICD-10-CM | POA: Diagnosis not present

## 2024-02-16 NOTE — Assessment & Plan Note (Signed)
 Well controlled, no changes to meds. Encouraged heart healthy diet such as the DASH diet and exercise as tolerated.

## 2024-02-16 NOTE — Assessment & Plan Note (Signed)
 On Levothyroxine, continue to monitor

## 2024-02-16 NOTE — Assessment & Plan Note (Signed)
 Hydrate and monitor

## 2024-02-16 NOTE — Assessment & Plan Note (Signed)
 hgba1c acceptable, minimize simple carbs. Increase exercise as tolerated. Continue current meds

## 2024-02-16 NOTE — Assessment & Plan Note (Signed)
Stable on current meds, no changes. 

## 2024-02-16 NOTE — Assessment & Plan Note (Signed)
 Encouraged DASH or MIND diet, decrease po intake and increase exercise as tolerated. Needs 7-8 hours of sleep nightly. Avoid trans fats, eat small, frequent meals every 4-5 hours with lean proteins, complex carbs and healthy fats. Minimize simple carbs, high fat foods and processed foods

## 2024-02-16 NOTE — Assessment & Plan Note (Signed)
 Tolerating statin, encouraged heart healthy diet, avoid trans fats, minimize simple carbs and saturated fats. Increase exercise as tolerated

## 2024-02-18 ENCOUNTER — Encounter: Payer: Self-pay | Admitting: Family Medicine

## 2024-02-18 ENCOUNTER — Telehealth (INDEPENDENT_AMBULATORY_CARE_PROVIDER_SITE_OTHER): Payer: Medicare Other | Admitting: Family Medicine

## 2024-02-18 VITALS — BP 127/68 | HR 68

## 2024-02-18 DIAGNOSIS — E1169 Type 2 diabetes mellitus with other specified complication: Secondary | ICD-10-CM

## 2024-02-18 DIAGNOSIS — F32A Depression, unspecified: Secondary | ICD-10-CM

## 2024-02-18 DIAGNOSIS — E66811 Obesity, class 1: Secondary | ICD-10-CM

## 2024-02-18 DIAGNOSIS — I10 Essential (primary) hypertension: Secondary | ICD-10-CM | POA: Diagnosis not present

## 2024-02-18 DIAGNOSIS — E039 Hypothyroidism, unspecified: Secondary | ICD-10-CM

## 2024-02-18 DIAGNOSIS — F419 Anxiety disorder, unspecified: Secondary | ICD-10-CM | POA: Diagnosis not present

## 2024-02-18 DIAGNOSIS — E1149 Type 2 diabetes mellitus with other diabetic neurological complication: Secondary | ICD-10-CM

## 2024-02-18 DIAGNOSIS — L089 Local infection of the skin and subcutaneous tissue, unspecified: Secondary | ICD-10-CM

## 2024-02-18 DIAGNOSIS — E785 Hyperlipidemia, unspecified: Secondary | ICD-10-CM

## 2024-02-18 DIAGNOSIS — E11628 Type 2 diabetes mellitus with other skin complications: Secondary | ICD-10-CM | POA: Diagnosis not present

## 2024-02-18 DIAGNOSIS — M1A9XX0 Chronic gout, unspecified, without tophus (tophi): Secondary | ICD-10-CM

## 2024-02-18 DIAGNOSIS — Z6832 Body mass index (BMI) 32.0-32.9, adult: Secondary | ICD-10-CM

## 2024-02-18 DIAGNOSIS — J449 Chronic obstructive pulmonary disease, unspecified: Secondary | ICD-10-CM | POA: Diagnosis not present

## 2024-02-18 MED ORDER — FLUOXETINE HCL 40 MG PO CAPS: 40.0000 mg | ORAL_CAPSULE | Freq: Every day | ORAL | 1 refills | Status: DC
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
## 2024-02-21 ENCOUNTER — Encounter: Payer: Self-pay | Admitting: Cardiology

## 2024-02-21 ENCOUNTER — Ambulatory Visit (HOSPITAL_BASED_OUTPATIENT_CLINIC_OR_DEPARTMENT_OTHER)
Admission: RE | Admit: 2024-02-21 | Discharge: 2024-02-21 | Disposition: A | Discharge status: Home / Self Care | Source: Ambulatory Visit | Attending: Cardiovascular Disease | Admitting: Cardiovascular Disease

## 2024-02-21 ENCOUNTER — Ambulatory Visit (HOSPITAL_COMMUNITY)
Admission: RE | Admit: 2024-02-21 | Discharge: 2024-02-21 | Disposition: A | Discharge status: Home / Self Care | Source: Ambulatory Visit | Attending: Cardiovascular Disease | Admitting: Cardiovascular Disease

## 2024-02-21 DIAGNOSIS — R000 Tachycardia, unspecified: Secondary | ICD-10-CM

## 2024-02-21 DIAGNOSIS — I10 Essential (primary) hypertension: Secondary | ICD-10-CM | POA: Diagnosis not present

## 2024-02-21 DIAGNOSIS — E785 Hyperlipidemia, unspecified: Secondary | ICD-10-CM

## 2024-02-21 DIAGNOSIS — R0609 Other forms of dyspnea: Secondary | ICD-10-CM | POA: Insufficient documentation

## 2024-02-21 DIAGNOSIS — E1169 Type 2 diabetes mellitus with other specified complication: Secondary | ICD-10-CM

## 2024-02-21 DIAGNOSIS — I701 Atherosclerosis of renal artery: Secondary | ICD-10-CM | POA: Diagnosis not present

## 2024-02-21 DIAGNOSIS — E782 Mixed hyperlipidemia: Secondary | ICD-10-CM | POA: Insufficient documentation

## 2024-02-21 LAB — ECHOCARDIOGRAM COMPLETE
AR max vel: 1.31 cm2
AV Area VTI: 1.32 cm2
AV Area mean vel: 1.25 cm2
AV Mean grad: 3 mmHg
AV Peak grad: 5.6 mmHg
Ao pk vel: 1.18 m/s
Area-P 1/2: 3.17 cm2
MV M vel: 1.35 m/s
MV Peak grad: 7.3 mmHg
S' Lateral: 3.71 cm

## 2024-02-21 MED ORDER — PERFLUTREN LIPID MICROSPHERE
1.0000 mL | INTRAVENOUS | Status: AC | PRN
  Administered 2024-02-21: 2 mL via INTRAVENOUS

## 2024-02-25 ENCOUNTER — Ambulatory Visit: Admitting: Podiatry

## 2024-02-25 DIAGNOSIS — E11621 Type 2 diabetes mellitus with foot ulcer: Secondary | ICD-10-CM | POA: Diagnosis not present

## 2024-02-25 DIAGNOSIS — Z7901 Long term (current) use of anticoagulants: Secondary | ICD-10-CM | POA: Diagnosis not present

## 2024-02-25 DIAGNOSIS — L97522 Non-pressure chronic ulcer of other part of left foot with fat layer exposed: Secondary | ICD-10-CM

## 2024-02-25 DIAGNOSIS — B351 Tinea unguium: Secondary | ICD-10-CM

## 2024-02-25 DIAGNOSIS — L97529 Non-pressure chronic ulcer of other part of left foot with unspecified severity: Secondary | ICD-10-CM

## 2024-02-26 NOTE — Progress Notes (Signed)
 Subjective: Chief Complaint  Patient presents with   Foot Ulcer    RM#12 Follow up on left foot ulcer patient is requesting nail trim as well.    78 year old male presents with the above concerns.  He is continue daily dressing changes as well to the wound.  Denies any drainage or pus.  No recent swelling or redness.    Also states he needs his nails trimmed as they are thick elongated he cannot trim them himself.    States he is being worked up currently for a heart condition.     Objective: AAO x3, NAD On the left second toe that there is still some mild edema present but seems to be improved.  Hyperkeratotic tissue noted distal aspect the toe partial granular wound present measuring 0.4 x 0.2 x 0.1 cm without any probing, undermining or tunneling.  Wound is the same and stable.  There is no fluctuation or crepitation. No malodor.   Previous hallux, third toe amputation. Digital contractures present. Nails are hypertrophic, dystrophic and elongated with some mobile debris present and yellow, brown discoloration.  Nails are tender 1 through 5 on the right as well as 2, 4, 5 on the left. No pain with calf compression, swelling, warmth, erythema  Assessment: Ulceration left second toe  Plan: Toe ulcer left -Medically necessary wound debridement was performed today.  I sharply debrided hyperkeratotic tissue at the distal portion of the toe utilizing the 312 scalpel to reveal the underlying central ulceration.  Pre and post with measurements of the same.  There is no bleeding.  Cleaned area with saline.  Iodosorb ointment was applied followed by dressing.  Continue offloading. -Will likely plan for transmetatarsal amputation at some point but currently appears to be stable.  Given other issues he wants to hold off on surgery for now we will continue to monitor closely.  Symptomatic onychomycosis - Sharply debrided nails x 8 without any complications or bleeding.   Charity Conch  DPM

## 2024-02-27 DIAGNOSIS — J449 Chronic obstructive pulmonary disease, unspecified: Secondary | ICD-10-CM | POA: Diagnosis not present

## 2024-03-01 ENCOUNTER — Other Ambulatory Visit: Payer: Self-pay | Admitting: Family Medicine

## 2024-03-01 ENCOUNTER — Encounter: Payer: Self-pay | Admitting: Family Medicine
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
## 2024-03-03 NOTE — Telephone Encounter (Signed)
 Requesting: Lyrica  150 mg  Contract: 12/12/2023 UDS: 11/19/2023 Last Visit: 02/18/2024 Next Visit: 05/28/2024 Last Refill: 08/12/2023  Please Advise

## 2024-03-04 NOTE — Progress Notes (Unsigned)
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 statin therapy at any age.   Cardiology referral should be considered for patients with CAC scores >=400 or >=75th percentile.   *2018 AHA/ACC/AACVPR/AAPA/ABC/ACPM/ADA/AGS/APhA/ASPC/NLA/PCNA Guideline on the Management of Blood Cholesterol: A Report of the American College of Cardiology/American Heart Association Task Force on Clinical Practice Guidelines. J Am Coll Cardiol. 2019;73(24):3168-3209.   Dinah Franco, MD   Electronically Signed: By: Hazle Lites M.D. On: 01/27/2024 14:45        Assessment & Plan   1.  Palpitations-reports occasional episodes of brief elevated heart rate with increased physical activity.  Cardiac event monitor showed SR/SB/ST, occasional PACs/PVCs, short  runs of NSVT, and 23 runs of SVT. Continue carvedilol  Avoid triggers caffeine, chocolate, EtOH, dehydration etc. Increase p.o. hydration Reviewed vagal maneuvers  Dyspnea-stable.  Coronary calcium  scoring and echocardiogram showed normal EF.  Dyspnea appears to be related to previous smoking and possible COPD. Increase physical activity as tolerated  Hyperlipidemia-LDL 73 on 11/24 Continue atorvastatin , ezetimibe  High-fiber diet Increase physical activity as tolerated  Essential hypertension-BP today 120/60. Continue losartan , carvedilol   Abdominal aortic aneurysm-noted to be 38 mm on recent check. Follows with Dr. Fulton Job  Renal artery stenosis, iliac artery stenosis left-had recent Doppler study 7/23 Following with [facility].  Disposition: Follow-up with Dr. Katheryne Pane or me in 4 months.   Chet Cota. Vermell Madrid NP-C     03/10/2024, 9:42 AM [facility] [address] Office [phone] Fax [phone]    I spent 14 minutes examining this patient, reviewing medications, and using patient centered shared decision making involving their cardiac care.   I spent  20 minutes reviewing past medical history,  medications, and prior cardiac tests.

## 2024-03-05 ENCOUNTER — Encounter: Payer: Self-pay | Admitting: *Deleted

## 2024-03-05 ENCOUNTER — Ambulatory Visit: Payer: Medicare Other | Attending: Vascular Surgery | Admitting: Vascular Surgery

## 2024-03-05 ENCOUNTER — Encounter: Payer: Self-pay | Admitting: Vascular Surgery

## 2024-03-05 ENCOUNTER — Ambulatory Visit (HOSPITAL_COMMUNITY)
Admission: RE | Admit: 2024-03-05 | Discharge: 2024-03-05 | Disposition: A | Discharge status: Home / Self Care | Payer: Medicare Other | Source: Ambulatory Visit | Attending: Vascular Surgery | Admitting: Vascular Surgery

## 2024-03-05 ENCOUNTER — Telehealth: Payer: Self-pay | Admitting: Family Medicine

## 2024-03-05 VITALS — BP 115/58 | HR 61 | Temp 97.6°F | Resp 20 | Ht 69.0 in | Wt 256.3 lb

## 2024-03-05 DIAGNOSIS — I739 Peripheral vascular disease, unspecified: Secondary | ICD-10-CM

## 2024-03-05 DIAGNOSIS — I70245 Atherosclerosis of native arteries of left leg with ulceration of other part of foot: Secondary | ICD-10-CM | POA: Diagnosis not present

## 2024-03-05 LAB — VAS US ABI WITH/WO TBI
Left ABI: 0.86
Right ABI: 1.13

## 2024-03-05 NOTE — Telephone Encounter (Signed)
 Copied from CRM [id number]. Topic: General - Other >> Mar 05, 2024  1:19 PM [place] E wrote: Reason for CRM: Odie Benne from [employer] lab called in regards to a fax that was sent over today 5/1 about a lab requisition form. Confirmed fax number with Odie Benne and she would like to know when this fax is received. Callback number [phone].

## 2024-03-09 ENCOUNTER — Other Ambulatory Visit: Payer: Self-pay | Admitting: Family Medicine

## 2024-03-09 MED ORDER — ALPRAZOLAM 0.5 MG PO TABS: 0.5000 mg | ORAL_TABLET | Freq: Two times a day (BID) | ORAL | 2 refills | Status: DC | PRN

## 2024-03-10 ENCOUNTER — Ambulatory Visit: Admitting: Cardiology

## 2024-03-10 ENCOUNTER — Ambulatory Visit: Attending: General Practice | Admitting: General Practice

## 2024-03-10 ENCOUNTER — Encounter: Payer: Self-pay | Admitting: General Practice

## 2024-03-10 VITALS — BP 120/60 | HR 65 | Ht 69.0 in | Wt 259.0 lb

## 2024-03-10 DIAGNOSIS — I701 Atherosclerosis of renal artery: Secondary | ICD-10-CM

## 2024-03-10 DIAGNOSIS — I739 Peripheral vascular disease, unspecified: Secondary | ICD-10-CM | POA: Diagnosis not present

## 2024-03-10 DIAGNOSIS — R002 Palpitations: Secondary | ICD-10-CM

## 2024-03-10 DIAGNOSIS — R0609 Other forms of dyspnea: Secondary | ICD-10-CM | POA: Diagnosis not present

## 2024-03-10 DIAGNOSIS — I10 Essential (primary) hypertension: Secondary | ICD-10-CM

## 2024-03-10 DIAGNOSIS — E782 Mixed hyperlipidemia: Secondary | ICD-10-CM | POA: Diagnosis not present

## 2024-03-10 DIAGNOSIS — I7143 Infrarenal abdominal aortic aneurysm, without rupture: Secondary | ICD-10-CM

## 2024-03-10 NOTE — Telephone Encounter (Signed)
 Handled by another CMA in the office. Wife declined the form and completion and stated she contacted the company.

## 2024-03-10 NOTE — Patient Instructions (Signed)
 Medication Instructions:  No medication changes were made during today's visit.  *If you need a refill on your cardiac medications before your next appointment, please call your pharmacy*   Lab Work: No labs were ordered during today's visit.  If you have labs (blood work) drawn today and your tests are completely normal, you will receive your results only by: MyChart Message (if you have MyChart) OR A paper copy in the mail If you have any lab test that is abnormal or we need to change your treatment, we will call you to review the results.   Testing/Procedures: No procedures were ordered during today's visit.    Follow-Up: At [facility], you and your health needs are our priority.  As part of our continuing mission to provide you with exceptional heart care, we have created designated Provider Care Teams.  These Care Teams include your primary Cardiologist (physician) and Advanced Practice Providers (APPs -  Physician Assistants and Nurse Practitioners) who all work together to provide you with the care you need, when you need it.  We recommend signing up for the patient portal called "MyChart".  Sign up information is provided on this After Visit Summary.  MyChart is used to connect with patients for Virtual Visits (Telemedicine).  Patients are able to view lab/test results, encounter notes, upcoming appointments, etc.  Non-urgent messages can be sent to your provider as well.   To learn more about what you can do with MyChart, go to [email or web address].    Your next appointment:   4 month(s)  Provider:   Lawana Pray, NP          Other Instructions To avoid caffeine consumption, you should limit or eliminate foods and beverages containing caffeine, such as coffee, tea, energy drinks, and chocolate. Additionally, be mindful of caffeine in processed foods like some snacks, cereals, and dietary supplements, as it can be added or naturally present in  ingredients.  Also, remember to stay hydrated!!!

## 2024-03-12 ENCOUNTER — Other Ambulatory Visit: Payer: Self-pay | Admitting: *Deleted

## 2024-03-12 DIAGNOSIS — I771 Stricture of artery: Secondary | ICD-10-CM

## 2024-03-12 DIAGNOSIS — I70245 Atherosclerosis of native arteries of left leg with ulceration of other part of foot: Secondary | ICD-10-CM

## 2024-03-12 DIAGNOSIS — I739 Peripheral vascular disease, unspecified: Secondary | ICD-10-CM

## 2024-03-24 DIAGNOSIS — H401131 Primary open-angle glaucoma, bilateral, mild stage: Secondary | ICD-10-CM | POA: Diagnosis not present

## 2024-03-24 DIAGNOSIS — H04123 Dry eye syndrome of bilateral lacrimal glands: Secondary | ICD-10-CM | POA: Diagnosis not present

## 2024-03-27 ENCOUNTER — Ambulatory Visit: Admitting: Podiatry

## 2024-03-28 DIAGNOSIS — J449 Chronic obstructive pulmonary disease, unspecified: Secondary | ICD-10-CM | POA: Diagnosis not present

## 2024-04-03 ENCOUNTER — Encounter: Payer: Self-pay | Admitting: Vascular Surgery

## 2024-04-03 ENCOUNTER — Ambulatory Visit: Admitting: Podiatry

## 2024-04-03 DIAGNOSIS — L97522 Non-pressure chronic ulcer of other part of left foot with fat layer exposed: Secondary | ICD-10-CM | POA: Diagnosis not present

## 2024-04-03 DIAGNOSIS — M2042 Other hammer toe(s) (acquired), left foot: Secondary | ICD-10-CM

## 2024-04-03 NOTE — Progress Notes (Signed)
 Subjective: Chief Complaint  Patient presents with   Foot Ulcer    RM#11 Follow up on left foot ulcer.      78 year old male presents with the above concerns.  He is continue daily dressing changes as well to the wound with Iodosorb.  Does not see any drainage or pus.  He states the day that he does want a proceed with surgery for the left foot.  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  We discussed transmetatarsal amputation.  He states that he wants to proceed with this.  Objective: AAO x3, NAD DP pulse. On the left second toe that there is still some mild edema noted.  Thick hyperkeratotic lesion of the plantar aspect the toe there is a superficial granular wound still present.  There is localized edema and erythema to the distal portion of the toe without any ascending cellulitis.  Digital contracture present to the left 4th and 5th toes.   No pain with calf compression, swelling, warmth, erythema  Assessment: Ulceration left second toe; history of hallux, second toe amputation  Plan: Toe ulcer left -Medically necessary wound debridement was performed today.  I sharply debrided hyperkeratotic tissue at the distal portion of the toe utilizing the 312 scalpel to reveal the underlying central ulceration.  After debrided the wound it measures 0.7 x 0.5 x 0.1 cm.  Prior to this it was covered with some callus formation the wound was smaller measuring 0.5 x 0.4 x 0.1 cm.  There is no bleeding.  Cleaned area with saline.  Iodosorb ointment was applied followed by dressing.  Continue offloading. - He now wants to proceed with transmetatarsal amputation/Achilles lengthening.  Prior to this he needs to have further workup with vascular surgery.  He is not to contact the office to be scheduled.  Return in about 3 weeks (around 04/24/2024) for left foot ulcer, surgery consult for transmetatarsal amputation .  Charity Conch DPM

## 2024-04-06 ENCOUNTER — Other Ambulatory Visit: Payer: Self-pay

## 2024-04-06 DIAGNOSIS — I70245 Atherosclerosis of native arteries of left leg with ulceration of other part of foot: Secondary | ICD-10-CM

## 2024-04-07 ENCOUNTER — Telehealth: Payer: Self-pay

## 2024-04-07 DIAGNOSIS — M4316 Spondylolisthesis, lumbar region: Secondary | ICD-10-CM | POA: Diagnosis not present

## 2024-04-07 DIAGNOSIS — M48062 Spinal stenosis, lumbar region with neurogenic claudication: Secondary | ICD-10-CM | POA: Diagnosis not present

## 2024-04-07 NOTE — Telephone Encounter (Signed)
 Copied from CRM [id number]. Topic: Clinical - Medication Prior Auth >> Apr 07, 2024 11:55 AM Lajean Pike wrote: Reason for CRM: [facility] called in to verify if the Authorization form was received via fax for the patients [employer]. Contact is [phone].   [facility] was advised that form was received and waiting for provider signature.

## 2024-04-07 NOTE — Telephone Encounter (Signed)
 Copied from CRM [id number]. Topic: Clinical - Order For Equipment >> Apr 07, 2024  1:50 PM Adonis Hoot wrote: Reason for CRM: [place]  [facility] call to check on status of fax that was sent over on 04/02/2024 for patient diabetic medical supplies. Stated that they would refax

## 2024-04-13 ENCOUNTER — Telehealth: Payer: Self-pay | Admitting: Family Medicine

## 2024-04-13 NOTE — Telephone Encounter (Unsigned)
 Copied from CRM [id number]. Topic: General - Other >> Apr 13, 2024 10:52 AM [name] wrote: Reason for CRM: [place]  [facility] [employer] calling to see if fax was sent as they was advise it would be sent by 06/5, contacted [employer]- PCP/CMA is out of office- they are needing the authorization refax for diabetes medical supply.

## 2024-04-14 ENCOUNTER — Ambulatory Visit: Admitting: Cardiology

## 2024-04-14 ENCOUNTER — Telehealth: Payer: Self-pay | Admitting: Neurology

## 2024-04-14 NOTE — Telephone Encounter (Signed)
 Never received a fax from [place]  [facility] only from [facility] medical supply and form was faxed over today,04/14/24.

## 2024-04-14 NOTE — Telephone Encounter (Signed)
 Copied from CRM [id number]. Topic: Clinical - Order For Equipment >> Apr 14, 2024  2:57 PM [place] wrote: Reason for CRM: [place]  [facility] calling to check on fax  they are needing the authorization refax for diabetes medical supply by next Friday 6/20

## 2024-04-15 ENCOUNTER — Other Ambulatory Visit: Payer: Self-pay

## 2024-04-15 ENCOUNTER — Telehealth: Payer: Self-pay

## 2024-04-15 ENCOUNTER — Ambulatory Visit (HOSPITAL_COMMUNITY)
Admission: RE | Admit: 2024-04-15 | Discharge: 2024-04-15 | Disposition: A | Discharge status: Home / Self Care | Source: Ambulatory Visit | Attending: Vascular Surgery | Admitting: Vascular Surgery

## 2024-04-15 ENCOUNTER — Encounter (HOSPITAL_COMMUNITY): Payer: Self-pay | Admitting: Vascular Surgery

## 2024-04-15 ENCOUNTER — Encounter (HOSPITAL_COMMUNITY)
Admission: RE | Disposition: A | Discharge status: Home / Self Care | Payer: Self-pay | Source: Ambulatory Visit | Attending: Vascular Surgery

## 2024-04-15 DIAGNOSIS — E785 Hyperlipidemia, unspecified: Secondary | ICD-10-CM | POA: Diagnosis not present

## 2024-04-15 DIAGNOSIS — Z87891 Personal history of nicotine dependence: Secondary | ICD-10-CM | POA: Insufficient documentation

## 2024-04-15 DIAGNOSIS — I70245 Atherosclerosis of native arteries of left leg with ulceration of other part of foot: Secondary | ICD-10-CM

## 2024-04-15 DIAGNOSIS — E11621 Type 2 diabetes mellitus with foot ulcer: Secondary | ICD-10-CM | POA: Diagnosis not present

## 2024-04-15 DIAGNOSIS — L97529 Non-pressure chronic ulcer of other part of left foot with unspecified severity: Secondary | ICD-10-CM | POA: Diagnosis not present

## 2024-04-15 DIAGNOSIS — I714 Abdominal aortic aneurysm, without rupture, unspecified: Secondary | ICD-10-CM | POA: Insufficient documentation

## 2024-04-15 DIAGNOSIS — I10 Essential (primary) hypertension: Secondary | ICD-10-CM | POA: Insufficient documentation

## 2024-04-15 DIAGNOSIS — E1151 Type 2 diabetes mellitus with diabetic peripheral angiopathy without gangrene: Secondary | ICD-10-CM | POA: Diagnosis not present

## 2024-04-15 DIAGNOSIS — Z79899 Other long term (current) drug therapy: Secondary | ICD-10-CM | POA: Insufficient documentation

## 2024-04-15 DIAGNOSIS — Z95820 Peripheral vascular angioplasty status with implants and grafts: Secondary | ICD-10-CM | POA: Insufficient documentation

## 2024-04-15 DIAGNOSIS — Z7982 Long term (current) use of aspirin: Secondary | ICD-10-CM | POA: Insufficient documentation

## 2024-04-15 DIAGNOSIS — Z7902 Long term (current) use of antithrombotics/antiplatelets: Secondary | ICD-10-CM | POA: Insufficient documentation

## 2024-04-15 HISTORY — PX: LOWER EXTREMITY ANGIOGRAPHY: CATH118251

## 2024-04-15 HISTORY — PX: ABDOMINAL AORTOGRAM: CATH118222

## 2024-04-15 HISTORY — PX: PERIPHERAL INTRAVASCULAR LITHOTRIPSY: CATH118324

## 2024-04-15 LAB — POCT I-STAT, CHEM 8
BUN: 25 mg/dL — ABNORMAL HIGH (ref 8–23)
Calcium, Ion: 1.12 mmol/L — ABNORMAL LOW (ref 1.15–1.40)
Chloride: 103 mmol/L (ref 98–111)
Creatinine, Ser: 0.9 mg/dL (ref 0.61–1.24)
Glucose, Bld: 93 mg/dL (ref 70–99)
HCT: 41 % (ref 39.0–52.0)
Hemoglobin: 13.9 g/dL (ref 13.0–17.0)
Potassium: 4.6 mmol/L (ref 3.5–5.1)
Sodium: 140 mmol/L (ref 135–145)
TCO2: 27 mmol/L (ref 22–32)

## 2024-04-15 LAB — GLUCOSE, CAPILLARY: Glucose-Capillary: 101 mg/dL — ABNORMAL HIGH (ref 70–99)

## 2024-04-15 SURGERY — ABDOMINAL AORTOGRAM
Anesthesia: LOCAL

## 2024-04-15 MED ORDER — SODIUM CHLORIDE 0.9 % IV SOLN: 250.0000 mL | INTRAVENOUS | Status: DC | PRN

## 2024-04-15 MED ORDER — ACETAMINOPHEN 325 MG PO TABS
650.0000 mg | ORAL_TABLET | ORAL | Status: DC | PRN
Start: 2024-04-15 — End: 2024-04-15

## 2024-04-15 MED ORDER — SODIUM CHLORIDE 0.9% FLUSH: 3.0000 mL | INTRAVENOUS | Status: DC | PRN

## 2024-04-15 MED ORDER — HEPARIN SODIUM (PORCINE) 1000 UNIT/ML IJ SOLN
INTRAMUSCULAR | Status: AC
  Filled 2024-04-15: qty 10

## 2024-04-15 MED ORDER — LIDOCAINE HCL (PF) 1 % IJ SOLN
INTRAMUSCULAR | Status: DC | PRN
Start: 2024-04-15 — End: 2024-04-15
  Administered 2024-04-15: 5 mL

## 2024-04-15 MED ORDER — ASPIRIN 81 MG PO CHEW
CHEWABLE_TABLET | ORAL | Status: AC
  Filled 2024-04-15: qty 1

## 2024-04-15 MED ORDER — FENTANYL CITRATE (PF) 100 MCG/2ML IJ SOLN
INTRAMUSCULAR | Status: DC | PRN
Start: 2024-04-15 — End: 2024-04-15
  Administered 2024-04-15 (×2): 50 ug via INTRAVENOUS

## 2024-04-15 MED ORDER — HEPARIN SODIUM (PORCINE) 1000 UNIT/ML IJ SOLN
INTRAMUSCULAR | Status: DC | PRN
Start: 2024-04-15 — End: 2024-04-15
  Administered 2024-04-15: 9000 [IU] via INTRAVENOUS

## 2024-04-15 MED ORDER — HEPARIN (PORCINE) IN NACL 1000-0.9 UT/500ML-% IV SOLN
INTRAVENOUS | Status: DC | PRN
Start: 2024-04-15 — End: 2024-04-15
  Administered 2024-04-15 (×2): 500 mL

## 2024-04-15 MED ORDER — MIDAZOLAM HCL 2 MG/2ML IJ SOLN
INTRAMUSCULAR | Status: DC | PRN
Start: 2024-04-15 — End: 2024-04-15
  Administered 2024-04-15 (×2): 1 mg via INTRAVENOUS

## 2024-04-15 MED ORDER — MIDAZOLAM HCL 2 MG/2ML IJ SOLN
INTRAMUSCULAR | Status: AC
  Filled 2024-04-15: qty 2

## 2024-04-15 MED ORDER — HYDRALAZINE HCL 20 MG/ML IJ SOLN: 5.0000 mg | INTRAMUSCULAR | Status: DC | PRN

## 2024-04-15 MED ORDER — SODIUM CHLORIDE 0.9% FLUSH: 3.0000 mL | Freq: Two times a day (BID) | INTRAVENOUS | Status: DC

## 2024-04-15 MED ORDER — SODIUM CHLORIDE 0.9 % WEIGHT BASED INFUSION: 1.0000 mL/kg/h | INTRAVENOUS | Status: DC

## 2024-04-15 MED ORDER — ASPIRIN 81 MG PO CHEW
CHEWABLE_TABLET | ORAL | Status: DC | PRN
Start: 2024-04-15 — End: 2024-04-15
  Administered 2024-04-15: 81 mg via ORAL

## 2024-04-15 MED ORDER — LIDOCAINE HCL (PF) 1 % IJ SOLN
INTRAMUSCULAR | Status: AC
  Filled 2024-04-15: qty 30

## 2024-04-15 MED ORDER — IODIXANOL 320 MG/ML IV SOLN
INTRAVENOUS | Status: DC | PRN
Start: 2024-04-15 — End: 2024-04-15
  Administered 2024-04-15: 100 mL

## 2024-04-15 MED ORDER — LABETALOL HCL 5 MG/ML IV SOLN: 10.0000 mg | INTRAVENOUS | Status: DC | PRN

## 2024-04-15 MED ORDER — SODIUM CHLORIDE 0.9 % IV SOLN: INTRAVENOUS | Status: DC

## 2024-04-15 MED ORDER — FENTANYL CITRATE (PF) 100 MCG/2ML IJ SOLN
INTRAMUSCULAR | Status: AC
Start: 2024-04-15 — End: 2024-04-15
  Filled 2024-04-15: qty 2

## 2024-04-15 SURGICAL SUPPLY — 15 items
CATH OMNI FLUSH 5F 65CM (CATHETERS) IMPLANT
CATH QUICKCROSS .035X135CM (MICROCATHETER) IMPLANT
CATH SHOCKWAVE M5 4.5X60 (CATHETERS) IMPLANT
COVER DOME SNAP 22 D (MISCELLANEOUS) IMPLANT
DEVICE CLOSURE MYNXGRIP 6/7F (Vascular Products) IMPLANT
GLIDEWIRE ADV .035X260CM (WIRE) IMPLANT
KIT ENCORE 26 ADVANTAGE (KITS) IMPLANT
KIT MICROPUNCTURE NIT STIFF (SHEATH) IMPLANT
SET ATX-X65L (MISCELLANEOUS) IMPLANT
SHEATH CATAPULT 6FR 45 (SHEATH) IMPLANT
SHEATH PINNACLE 5F 10CM (SHEATH) IMPLANT
SHEATH PINNACLE 6F 10CM (SHEATH) IMPLANT
TRAY PV CATH (CUSTOM PROCEDURE TRAY) ×2 IMPLANT
WIRE BENTSON .035X145CM (WIRE) IMPLANT
WIRE SPARTACORE .014X300CM (WIRE) IMPLANT
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
## 2024-04-15 NOTE — Telephone Encounter (Signed)
 Copied from CRM [id number]. Topic: General - Other >> Apr 15, 2024 10:47 AM Annelle Kiel wrote: Reason for CRM: [facility] Is needing patient appt notes faxed over to [phone] so patient can get his freestyle librae sent out    Office note was refax to [employer].

## 2024-04-15 NOTE — Progress Notes (Signed)
 Up and walked and tolerated well; right groin stable, no bleeding or hematoma

## 2024-04-15 NOTE — Op Note (Signed)
 Patient name: [name] MRN: [id number] DOB: November 19, 1945 Sex: male  04/15/2024 Pre-operative Diagnosis: Left lower extremity critical and ischemic tissue loss of the second digit Post-operative diagnosis:  Same Surgeon:  Kayla Part, MD Procedure Performed: 1.  Ultrasound-guided micropuncture access of the right common femoral artery in retrograde fashion 2.  Aortogram 3.  Secondary cannulation, left lower extremity angiogram 4.  Balloon lithotripsy popliteal artery 4.5 x 60 mm 5.  Moderate sedation time 37 minutes, contrast 100 cc  Indications: Patient is a 78 year old male with history of left-sided critical limb ischemia with tissue loss having previously undergone SFA popliteal artery balloon lithotripsy with healing of the 1st and 3rd toe amputations.  He has a nonhealing wound on the second toe, with plans to undergo TMA.  I was asked for angiography in an effort to define and improve distal perfusion prior to surgery to optimize perfusion for wound healing.  Findings:  Sluggish flow indicative of heart failure.  Aortogram: Bilateral renal arteries patent, infrarenal aorta with fusiform aneurysm.  Bilateral iliac arteries with stents, no flow-limiting stenosis identified.  Widely patent external iliacs bilaterally, hypogastric arteries bilaterally. On the left: 1 the patent common femoral artery, profunda, superficial femoral artery with mild disease.  Popliteal artery is patent with focal greater than 90% stenosis in the P2 segment.  Distally, there is three-vessel runoff to the foot with focal stenosis appreciated in the anterior tibial artery. Small vessel disease in the foot  Procedure:  The patient was identified in the holding area and taken to room 8.  The patient was then placed supine on the table and prepped and draped in the usual sterile fashion.  A time out was called.  Ultrasound was used to evaluate the right common femoral artery.  It was patent .  A digital  ultrasound image was acquired.  A micropuncture needle was used to access the right common femoral artery under ultrasound guidance.  An 018 wire was advanced without resistance and a micropuncture sheath was placed.  The 018 wire was removed and a benson wire was placed.  The micropuncture sheath was exchanged for a 5 french sheath.  An omniflush catheter was advanced over the wire to the level of L-1.  An abdominal angiogram was obtained.  Next, using the omniflush catheter and a benson wire, the aortic bifurcation was crossed and the catheter was placed into theleft external iliac artery and left runoff was obtained.    See results above.  I elected to attempt invention on the segment of the left popliteal artery.  A wire was sent into the peroneal artery and the sheath was exchanged for a 6 x 45 cm sheath parked in the left common femoral artery.  Next, the wire was exchanged for a 0.014 wire.  Balloon lithotripsy was utilized as I did not want to dissect behind the knee.  A 4.5 x 60 mm lithotripsy balloon was brought onto the field and laid across the lesion.  All pulses were utilized-300 and atmosphere of 2.  Follow-up angiography demonstrated an excellent result with resolution of flow-limiting stenosis.  Residual stenosis less than 20%.  When measured from the proximal vessel size.  There is some poststenotic dilatation distally.  I did not treat the anterior tibial artery lesion as there was excellent runoff to the foot and I did not want to cause a dissection with the significant amount of disease appreciated. Access managed with a minx without issue.  Impression: Successful 4.5 x  60 mm balloon lithotripsy of the P2 segment of the popliteal artery with three-vessel runoff to the foot.   Kayla Part MD [facility] Office: [phone]

## 2024-04-16 DIAGNOSIS — E119 Type 2 diabetes mellitus without complications: Secondary | ICD-10-CM | POA: Diagnosis not present

## 2024-04-16 NOTE — Telephone Encounter (Signed)
 Spoke with patients wife and she stated not to fax anything to [facility]  or [facility] never again for the patient. She is going to reach out to both company and let them know to stop faxing paperwork over to the office because the patient just be ordering the libre's with different companies.

## 2024-04-20 NOTE — Telephone Encounter (Signed)
 Returned [facility]  [employer] number to advise that forms will not be send per wife request and per rep no account for the patient is active.   Copied from CRM [id number]. Topic: Clinical - Order For Equipment >> Apr 20, 2024 11:21 AM Adonis Hoot wrote: Reason for CRM: [facility] [employer] called in regards to order that was sent over for continuous glucose monitor. Stated that order was faxed over on 3 different occasions and they haven't received anything back yet Cb#: [phone] Fax#: [phone]

## 2024-04-28 ENCOUNTER — Ambulatory Visit (INDEPENDENT_AMBULATORY_CARE_PROVIDER_SITE_OTHER): Admitting: Podiatry

## 2024-04-28 ENCOUNTER — Ambulatory Visit (INDEPENDENT_AMBULATORY_CARE_PROVIDER_SITE_OTHER)

## 2024-04-28 VITALS — BP 144/56 | HR 94 | Temp 97.2°F | Resp 18

## 2024-04-28 DIAGNOSIS — M86672 Other chronic osteomyelitis, left ankle and foot: Secondary | ICD-10-CM | POA: Diagnosis not present

## 2024-04-28 DIAGNOSIS — M2062 Acquired deformities of toe(s), unspecified, left foot: Secondary | ICD-10-CM

## 2024-04-28 DIAGNOSIS — L97522 Non-pressure chronic ulcer of other part of left foot with fat layer exposed: Secondary | ICD-10-CM | POA: Diagnosis not present

## 2024-04-28 NOTE — H&P (View-Only) (Signed)
 Subjective: Chief Complaint  Patient presents with   Foot Ulcer    RM#13 Follow up on left toe ulcer.    78 year old male presents with the above concerns.  He is continue daily dressing changes as well to the wound with Iodosorb.  Does not see any drainage or pus.  He underwent  successful 4.5 x 60 mm balloon lithotripsy of the P2 segment of the popliteal artery with three-vessel runoff to the foot on 04/15/2024.   He was to proceed with transmetatarsal amputation in July.   Objective: AAO x3, NAD DP pulse.  Feet are warm and well-perfused. On the left second toe that there is still some mild edema noted.  There is superficial granular wound noted at the distal aspect left second toe with visual deformity present.  There is no probing, undermining or tunneling.  There is no fluctuation or cavitation.  There is no malodor.  Digital contracture present to the left 4th and 5th toes.  History of hallux, third ray amputations No pain with calf compression, swelling, warmth, erythema  Assessment: Chronic ulceration left second toe; history of hallux, second toe amputation  Plan:  Radiology Multiple views left foot obtained.  No evidence of acute fracture.  Chronic remodeling of the lesser metatarsals.  Previous partial first, third amputations.  There is likely chronic changes present to the distal phalanx of the second digit.   Toe ulcer left -Medically necessary wound debridement was performed today.  I sharply debrided hyperkeratotic tissue at the distal portion of the toe utilizing the 312 scalpel to reveal the underlying central ulceration.  After debrided the wound it measures 0.6 x 0.5 x 0.1 cm.  Most of it was covered with callus.  Debridement was difficult to measure the wound.  There was minimal blood loss.  Hemostasis achieved and manual compression.  Tolerated well.  Antibiotic ointment was applied followed by dressing.  Continue with daily dressing changes, offloading.    Infection left second toe distal phalanx, there is a deformity - Given ongoing wound to the left second toe with likely concern for chronic osteomyelitis of the distal phalanx and digit deformity and history of previous first, third digit amputations discussed with him and recommended transmetatarsal amputation with possible Achilles lengthening.  We discussed this in multiple occasions and he wishes to proceed with this in July. -The incision placement as well as the postoperative course was discussed with the patient. I discussed risks of the surgery which include, but not limited to, infection, bleeding, pain, swelling, need for further surgery, delayed or nonhealing, painful or ugly scar, numbness or sensation changes, over/under correction, recurrence, transfer lesions, further deformity, hardware failure, DVT/PE, loss of toe/foot. Patient understands these risks and wishes to proceed with surgery. The surgical consent was reviewed with the patient all 3 pages were signed. No promises or guarantees were given to the outcome of the procedure. All questions were answered to the best of my ability. Before the surgery the patient was encouraged to call the office if there is any further questions. The surgery will be performed at the hospital.  -He will need a H&P from his PCP prior to surgery.   [name] DPM

## 2024-04-28 NOTE — Telephone Encounter (Signed)
 [facility]  [employer] call was returned at [phone] and they was advised again per wife order does not need to be completed. Per customer service rep the order was cancelled already when the wife called in and she was advised that another call and fax was received by the office today,04/28/24.  Copied from CRM [id number]. Topic: General - Other >> Apr 28, 2024  1:32 PM Burnard DEL wrote: Reason for CRM: [facility] called in regarding order for  glucose monitor they stated that they did not receive the form completed .They need it completed and faxed back as soon as possible.  Fx  number: [phone]

## 2024-04-28 NOTE — Progress Notes (Signed)
 Subjective: Chief Complaint  Patient presents with   Foot Ulcer    RM#13 Follow up on left toe ulcer.    78 year old male presents with the above concerns.  He is continue daily dressing changes as well to the wound with Iodosorb.  Does not see any drainage or pus.  He underwent  successful 4.5 x 60 mm balloon lithotripsy of the P2 segment of the popliteal artery with three-vessel runoff to the foot on 04/15/2024.   He was to proceed with transmetatarsal amputation in July.   Objective: AAO x3, NAD DP pulse.  Feet are warm and well-perfused. On the left second toe that there is still some mild edema noted.  There is superficial granular wound noted at the distal aspect left second toe with visual deformity present.  There is no probing, undermining or tunneling.  There is no fluctuation or cavitation.  There is no malodor.  Digital contracture present to the left 4th and 5th toes.  History of hallux, third ray amputations No pain with calf compression, swelling, warmth, erythema  Assessment: Chronic ulceration left second toe; history of hallux, second toe amputation  Plan:  Radiology Multiple views left foot obtained.  No evidence of acute fracture.  Chronic remodeling of the lesser metatarsals.  Previous partial first, third amputations.  There is likely chronic changes present to the distal phalanx of the second digit.   Toe ulcer left -Medically necessary wound debridement was performed today.  I sharply debrided hyperkeratotic tissue at the distal portion of the toe utilizing the 312 scalpel to reveal the underlying central ulceration.  After debrided the wound it measures 0.6 x 0.5 x 0.1 cm.  Most of it was covered with callus.  Debridement was difficult to measure the wound.  There was minimal blood loss.  Hemostasis achieved and manual compression.  Tolerated well.  Antibiotic ointment was applied followed by dressing.  Continue with daily dressing changes, offloading.    Infection left second toe distal phalanx, there is a deformity - Given ongoing wound to the left second toe with likely concern for chronic osteomyelitis of the distal phalanx and digit deformity and history of previous first, third digit amputations discussed with him and recommended transmetatarsal amputation with possible Achilles lengthening.  We discussed this in multiple occasions and he wishes to proceed with this in July. -The incision placement as well as the postoperative course was discussed with the patient. I discussed risks of the surgery which include, but not limited to, infection, bleeding, pain, swelling, need for further surgery, delayed or nonhealing, painful or ugly scar, numbness or sensation changes, over/under correction, recurrence, transfer lesions, further deformity, hardware failure, DVT/PE, loss of toe/foot. Patient understands these risks and wishes to proceed with surgery. The surgical consent was reviewed with the patient all 3 pages were signed. No promises or guarantees were given to the outcome of the procedure. All questions were answered to the best of my ability. Before the surgery the patient was encouraged to call the office if there is any further questions. The surgery will be performed at the hospital.  -He will need a H&P from his PCP prior to surgery.   [name] DPM

## 2024-04-28 NOTE — Patient Instructions (Signed)

## 2024-05-01 ENCOUNTER — Telehealth: Payer: Self-pay | Admitting: Podiatry

## 2024-05-01 NOTE — Telephone Encounter (Signed)
 PER [employer] WEBSITE NO AUTH IS NEEDED FOR CPT [phone].  DOS 05/20/24  DECISION # [id number]

## 2024-05-01 NOTE — Telephone Encounter (Signed)
 Spoke to pts wife and gave her surgery date and information .

## 2024-05-04 DIAGNOSIS — H401111 Primary open-angle glaucoma, right eye, mild stage: Secondary | ICD-10-CM | POA: Diagnosis not present
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
## 2024-05-14 ENCOUNTER — Telehealth: Payer: Self-pay | Admitting: Family Medicine

## 2024-05-14 NOTE — Telephone Encounter (Signed)
 Copied from CRM [id number]. Topic: General - Call Back - No Documentation >> May 14, 2024  9:29 AM Avram MATSU wrote: Reason for CRM: Judy Is calling from [place] and had Dorothe from [facility] supply on the line. She stated Dorothe needed to talk to the patient provider. Dorothe callback number is  [phone]

## 2024-05-15 ENCOUNTER — Ambulatory Visit (INDEPENDENT_AMBULATORY_CARE_PROVIDER_SITE_OTHER): Admitting: Student

## 2024-05-15 ENCOUNTER — Encounter: Payer: Self-pay | Admitting: Student

## 2024-05-15 VITALS — BP 110/70 | HR 76 | Temp 98.1°F | Ht 69.0 in | Wt 253.6 lb

## 2024-05-15 DIAGNOSIS — J432 Centrilobular emphysema: Secondary | ICD-10-CM | POA: Diagnosis not present

## 2024-05-15 DIAGNOSIS — D649 Anemia, unspecified: Secondary | ICD-10-CM

## 2024-05-15 DIAGNOSIS — I10 Essential (primary) hypertension: Secondary | ICD-10-CM

## 2024-05-15 DIAGNOSIS — R350 Frequency of micturition: Secondary | ICD-10-CM | POA: Diagnosis not present

## 2024-05-15 DIAGNOSIS — M1A9XX0 Chronic gout, unspecified, without tophus (tophi): Secondary | ICD-10-CM | POA: Diagnosis not present

## 2024-05-15 DIAGNOSIS — E1149 Type 2 diabetes mellitus with other diabetic neurological complication: Secondary | ICD-10-CM | POA: Diagnosis not present

## 2024-05-15 DIAGNOSIS — F0390 Unspecified dementia without behavioral disturbance: Secondary | ICD-10-CM

## 2024-05-15 DIAGNOSIS — N289 Disorder of kidney and ureter, unspecified: Secondary | ICD-10-CM

## 2024-05-15 DIAGNOSIS — E039 Hypothyroidism, unspecified: Secondary | ICD-10-CM | POA: Diagnosis not present

## 2024-05-15 DIAGNOSIS — E782 Mixed hyperlipidemia: Secondary | ICD-10-CM | POA: Diagnosis not present

## 2024-05-15 LAB — POCT URINALYSIS DIPSTICK
Blood, UA: NEGATIVE
Glucose, UA: NEGATIVE
Ketones, UA: NEGATIVE
Leukocytes, UA: NEGATIVE
Nitrite, UA: NEGATIVE
Protein, UA: NEGATIVE
Spec Grav, UA: 1.01 (ref 1.010–1.025)
Urobilinogen, UA: 0.2 U/dL
pH, UA: 6 (ref 5.0–8.0)

## 2024-05-15 LAB — URIC ACID: Uric Acid, Serum: 5.5 mg/dL (ref 4.0–7.8)

## 2024-05-15 LAB — CBC WITH DIFFERENTIAL/PLATELET
Basophils Absolute: 0 K/uL (ref 0.0–0.1)
Basophils Relative: 0.5 % (ref 0.0–3.0)
Eosinophils Absolute: 0.2 K/uL (ref 0.0–0.7)
Eosinophils Relative: 3.1 % (ref 0.0–5.0)
HCT: 40.3 % (ref 39.0–52.0)
Hemoglobin: 13.4 g/dL (ref 13.0–17.0)
Lymphocytes Relative: 23.7 % (ref 12.0–46.0)
Lymphs Abs: 1.7 K/uL (ref 0.7–4.0)
MCHC: 33.2 g/dL (ref 30.0–36.0)
MCV: 95.7 fl (ref 78.0–100.0)
Monocytes Absolute: 0.8 K/uL (ref 0.1–1.0)
Monocytes Relative: 10.6 % (ref 3.0–12.0)
Neutro Abs: 4.4 K/uL (ref 1.4–7.7)
Neutrophils Relative %: 62.1 % (ref 43.0–77.0)
Platelets: 170 K/uL (ref 150.0–400.0)
RBC: 4.21 Mil/uL — ABNORMAL LOW (ref 4.22–5.81)
RDW: 13.7 % (ref 11.5–15.5)
WBC: 7.1 K/uL (ref 4.0–10.5)

## 2024-05-15 LAB — MICROALBUMIN / CREATININE URINE RATIO
Creatinine,U: 105.9 mg/dL
Microalb Creat Ratio: 29.3 mg/g (ref 0.0–30.0)
Microalb, Ur: 3.1 mg/dL — ABNORMAL HIGH (ref 0.0–1.9)

## 2024-05-15 LAB — COMPREHENSIVE METABOLIC PANEL WITH GFR
ALT: 16 U/L (ref 0–53)
AST: 15 U/L (ref 0–37)
Albumin: 3.9 g/dL (ref 3.5–5.2)
Alkaline Phosphatase: 93 U/L (ref 39–117)
BUN: 22 mg/dL (ref 6–23)
CO2: 36 meq/L — ABNORMAL HIGH (ref 19–32)
Calcium: 9.1 mg/dL (ref 8.4–10.5)
Chloride: 101 meq/L (ref 96–112)
Creatinine, Ser: 1 mg/dL (ref 0.40–1.50)
GFR: 72.99 mL/min (ref >60.00)
Glucose, Bld: 141 mg/dL — ABNORMAL HIGH (ref 70–99)
Potassium: 4.8 meq/L (ref 3.5–5.1)
Sodium: 140 meq/L (ref 135–145)
Total Bilirubin: 0.5 mg/dL (ref 0.2–1.2)
Total Protein: 6.3 g/dL (ref 6.0–8.3)

## 2024-05-15 LAB — LIPID PANEL
Cholesterol: 126 mg/dL (ref 0–200)
HDL: 34.2 mg/dL — ABNORMAL LOW (ref >39.00)
LDL Cholesterol: 68 mg/dL (ref 0–99)
NonHDL: 92.05
Total CHOL/HDL Ratio: 4
Triglycerides: 122 mg/dL (ref 0.0–149.0)
VLDL: 24.4 mg/dL (ref 0.0–40.0)

## 2024-05-15 LAB — TSH: TSH: 2.85 u[IU]/mL (ref 0.35–5.50)

## 2024-05-15 LAB — HEMOGLOBIN A1C: Hgb A1c MFr Bld: 7 % — ABNORMAL HIGH (ref 4.6–6.5)

## 2024-05-15 NOTE — Assessment & Plan Note (Signed)
 On Levothyroxine, continue to monitor

## 2024-05-15 NOTE — Assessment & Plan Note (Signed)
 History of hyperlipidemia on high-dose atorvastatin  and Zetia  with lipid profile performed 09/19/2023 revealing total cholesterol 122, LDL 73 and HDL 28. Update labs today.

## 2024-05-15 NOTE — Assessment & Plan Note (Signed)
 Well controlled, no changes to meds. Encouraged heart healthy diet such as the DASH diet and exercise as tolerated.

## 2024-05-15 NOTE — Progress Notes (Signed)
 Subjective:     HPI: Pt is a 78 y.o. male who is here for preoperative clearance for left toe amputation scheduled 05/20/2024 with [facility]- Dr. Gershon.   1) High Risk Cardiac Conditions:  1) Recent MI - No.  2) Decompensated Heart Failure - No.  3) Unstable angina - No.  4) Symptomatic arrythmia - No.  5) Sx Valvular Disease - No  2) Intermediate Risk Factors: DM, CKD, CVA, CAD   2) Functional Status: > 4 mets   3) Surgery Specific Risk: Intermediate  4) Further Noninvasive evaluation:   1) EKG - 03/10/2024  4) CT Chest- 01/27/2024   Patient denies fever, chills, SOB, CP, palpitations, dyspnea, edema, HA, vision changes, N/V/D, abdominal pain, rash, weight changes, and recent illness or hospitalizations.    Social history:Relevant past medical, surgical, family and social history reviewed and updated as indicated. Interim medical history since our last visit reviewed.  Allergies and medications reviewed and updated. Social History   Socioeconomic History   Marital status: Married    Spouse name: [name]   Number of children: 3   Years of education: 12   Highest education level: 12th grade  Occupational History   Occupation: Maintenance    Comment: Primary Care at [employer]  Tobacco Use   Smoking status: Former    Current packs/day: 0.00    Average packs/day: 2.0 packs/day for 40.0 years (80.0 ttl pk-yrs)    Types: Cigarettes, Cigars    Start date: 05/04/1966    Quit date: 05/04/2006    Years since quitting: 18.0   Smokeless tobacco: Never  Vaping Use   Vaping status: Never Used  Substance and Sexual Activity   Alcohol use: Not Currently    Alcohol/week: 0.0 standard drinks of alcohol    Comment: rare - 11/10/2012 quit > 20 yr ago   Drug use: No   Sexual activity: Not Currently  Other Topics Concern   Not on file  Social History Narrative   Lives with wife (1993), no pets   Grown children.   Occupation: retired, Music therapist, Office manager at  [employer])   Activity: golf, gardening    Diet: good water, fruits/vegetables daily   Right handed   One story home   Drinks caffeine prn   Social Drivers of Corporate investment banker Strain: Low Risk  (05/13/2024)   Overall Financial Resource Strain (CARDIA)    Difficulty of Paying Living Expenses: Not very hard  Food Insecurity: No Food Insecurity (05/13/2024)   Hunger Vital Sign    Worried About Running Out of Food in the Last Year: Never true    Ran Out of Food in the Last Year: Never true  Transportation Needs: No Transportation Needs (05/13/2024)   PRAPARE - Administrator, Civil Service (Medical): No    Lack of Transportation (Non-Medical): No  Physical Activity: Inactive (05/13/2024)   Exercise Vital Sign    Days of Exercise per Week: 0 days    Minutes of Exercise per Session: Not on file  Stress: Stress Concern Present (05/13/2024)   [employer] of Occupational Health - Occupational Stress Questionnaire    Feeling of Stress: To some extent  Social Connections: Moderately Isolated (05/13/2024)   Social Connection and Isolation Panel    Frequency of Communication with Friends and Family: Once a week    Frequency of Social Gatherings with Friends and Family: Never    Attends Religious Services: 1 to 4 times per year  Active Member of Clubs or Organizations: No    Attends Banker Meetings: Not on file    Marital Status: Married  Intimate Partner Violence: Not At Risk (09/23/2023)   Humiliation, Afraid, Rape, and Kick questionnaire    Fear of Current or Ex-Partner: No    Emotionally Abused: No    Physically Abused: No    Sexually Abused: No    DATA REVIEWED: CHART IN EPIC  ROS: Negative unless specifically indicated above in HPI.    Allergies  Allergen Reactions   Metformin  Diarrhea   Prednisone  Itching      Past Medical History:  Diagnosis Date   AAA (abdominal aortic aneurysm) without rupture 2008   Stable AAA max diameter  4.1cm but likely 3.5x3.7cm, rpt 1 yr (09/2015)   Allergic rhinitis    Anemia 08/14/2013   Aneurysm, cerebral, nonruptured 03/02/2014   Arthritis    left ankle; back LLE; right wrist  (11/10/2012)   Asthma    Back pain 03/17/2017   Carotid artery disease 05/04/2022   Cellulitis and abscess of toe of left foot 05/26/2019   Class 1 obesity with serious comorbidity and body mass index (BMI) of 32.0 to 32.9 in adult 12/16/2018   COPD (chronic obstructive pulmonary disease) 02/17/2015   Coronary atherosclerosis 10/17/2007   Decreased hearing    Diabetes mellitus type 2 with neurological manifestations 04/21/2007   Sees Podiatry  Sees [name] for eye exam, 05/06/13   Diabetic foot ulcer 05/30/2020   Diabetic peripheral vascular disease    Dyspnea 03/06/2022   Dysrhythmia    skips beats at times   Generalized anxiety disorder 06/05/2022   GERD 11/16/2009   Glaucoma 10/17/2007   Gout 12/06/2016   Hereditary and idiopathic peripheral neuropathy 10/17/2014   History of COVID-19 12/04/2019   Hyperlipidemia associated with type 2 diabetes mellitus 06/13/2020   Hypertension    Hypothyroidism 05/20/2021   Hypoxemia 09/07/2021   Iliac artery stenosis, left 10/19/2018   Kidney stone    passed them on my own 3 times (11/10/2012)   Knee pain, left 12/12/2009   Major depressive disorder 05/25/2018   Mild obstructive sleep apnea 03/06/2022   With hypoxia, no CPAP   Neck pain 12/27/2020   Osteoarthritis 06/05/2022   Pneumonia 2011   Prolonged QT interval 09/07/2021   Renal artery stenosis 10/19/2018   Renal insufficiency 08/14/2013   Right hip pain    Right otitis media 05/30/2020   SCCA (squamous cell carcinoma) of skin 07/28/2018   Left Hand Dorsum (well diff) (curet and 5FU)   Stroke 05/09/2005   Small chronic cortical and white matter infarcts within the right ACA/MCA and right ACA/PCA watershed territories   Synovial cyst of lumbar facet joint 04/29/2017   Tinnitus         Current Outpatient Medications:    albuterol  (PROVENTIL ) (2.5 MG/3ML) 0.083% nebulizer solution, Take 3 mLs (2.5 mg total) by nebulization every 6 (six) hours as needed for wheezing or shortness of breath., Disp: 150 mL, Rfl: 1   albuterol  (VENTOLIN  HFA) 108 (90 Base) MCG/ACT inhaler, Inhale 2 puffs into the lungs every 6 (six) hours as needed., Disp: 18 g, Rfl: 3   ALPRAZolam  (XANAX ) 0.5 MG tablet, Take 1 tablet (0.5 mg total) by mouth 2 (two) times daily as needed for anxiety., Disp: 60 tablet, Rfl: 2   arformoterol  (BROVANA ) 15 MCG/2ML NEBU, Substituted for: Brovana  Neb Solution Inhale one vial via nebulizer once daily., Disp: 2 mL, Rfl: 10   Ascorbic  Acid (VITAMIN C ) 1000 MG tablet, Take 1,000 mg by mouth daily., Disp: , Rfl:    aspirin  EC 81 MG tablet, Take 81 mg by mouth at bedtime., Disp: , Rfl:    atorvastatin  (LIPITOR ) 80 MG tablet, Take 1 tablet (80 mg total) by mouth daily., Disp: 90 tablet, Rfl: 0   BD PEN NEEDLE NANO 2ND GEN 32G X 4 MM MISC, USE AS DIRECTED WITH HUMALOG  PEN, Disp: , Rfl:    Biotin 1000 MCG tablet, Take 1,000 mg by mouth daily., Disp: , Rfl:    Blood Glucose Monitoring Suppl (ACCU-CHEK AVIVA PLUS) w/Device KIT, Check blood sugars three times daily, Disp: 1 kit, Rfl: 0   budesonide  (PULMICORT ) 0.25 MG/2ML nebulizer solution, Take 2 mLs (0.25 mg total) by nebulization daily., Disp: 120 mL, Rfl: 12   carvedilol  (COREG ) 25 MG tablet, TAKE ONE-HALF TABLET BY  MOUTH TWICE DAILY WITH  MEALS, Disp: 90 tablet, Rfl: 3   cholecalciferol  (VITAMIN D ) 1000 UNITS tablet, Take 1,000 Units by mouth daily., Disp: , Rfl:    clopidogrel  (PLAVIX ) 75 MG tablet, Take 1 tablet (75 mg total) by mouth daily., Disp: 90 tablet, Rfl: 1   dorzolamide -timolol  (COSOPT ) 2-0.5 % ophthalmic solution, Place 1 drop into both eyes daily., Disp: , Rfl:    ezetimibe  (ZETIA ) 10 MG tablet, TAKE 1 TABLET BY MOUTH EVERY DAY, Disp: 90 tablet, Rfl: 3   FLUoxetine  (PROZAC ) 40 MG capsule, Take 1 capsule (40  mg total) by mouth daily., Disp: 90 capsule, Rfl: 1   glimepiride  (AMARYL ) 2 MG tablet, Take 1 tablet (2 mg total) by mouth 2 (two) times daily., Disp: 200 tablet, Rfl: 2   glucose blood (ACCU-CHEK GUIDE TEST) test strip, Check blood sugars 3 times daily, Disp: 300 each, Rfl: 12   HYDROcodone -acetaminophen  (NORCO) 7.5-325 MG tablet, Take 1-2 tablets by mouth every 12 (twelve) hours as needed for severe pain (pain score 7-10) or moderate pain (pain score 4-6)., Disp: , Rfl:    insulin  lispro (HUMALOG  KWIKPEN) 100 UNIT/ML KwikPen, Before each meal 3 times a day, 140-199 - 3 units, 200-250 - 6 units, 251-299 - 8 units,  300-349 - 10 units,  350 or above 12 units. Insulin  PEN if approved, provide syringes and needles if needed., Disp: 15 mL, Rfl: 3   Lancets (ACCU-CHEK MULTICLIX) lancets, Check blood sugars three times daily, Disp: 300 each, Rfl: 12   Latanoprost  0.005 % EMUL, Place 1 drop into both eyes in the morning and at bedtime., Disp: , Rfl:    levothyroxine  (SYNTHROID ) 25 MCG tablet, Take 1 tablet (25 mcg total) by mouth daily before breakfast., Disp: 90 tablet, Rfl: 1   losartan  (COZAAR ) 50 MG tablet, Take 1 tablet (50 mg total) by mouth daily., Disp: 90 tablet, Rfl: 1   Magnesium  500 MG TABS, Take 500 mg by mouth daily., Disp: , Rfl:    memantine  (NAMENDA ) 5 MG tablet, Take 1 tablet (5 mg total) by mouth 2 (two) times daily., Disp: 180 tablet, Rfl: 3   Multiple Vitamin (MULTIVITAMIN) tablet, Take 1 tablet by mouth daily., Disp: , Rfl:    Omega-3 Fatty Acids (FISH OIL) 1000 MG CAPS, Take 1,000 mg by mouth 2 (two) times daily., Disp: , Rfl:    pioglitazone  (ACTOS ) 30 MG tablet, Take 1 tablet (30 mg total) by mouth daily., Disp: 90 tablet, Rfl: 1   prednisoLONE  acetate (PRED FORTE ) 1 % ophthalmic suspension, Place 1 drop into the right eye 2 (two) times daily., Disp: , Rfl:  pregabalin  (LYRICA ) 150 MG capsule, TAKE 1 CAPSULE BY MOUTH TWICE A DAY, Disp: 180 capsule, Rfl: 1   vitamin B-12  (CYANOCOBALAMIN) 1000 MCG tablet, Take 1,000 mcg by mouth See admin instructions. Take one tablet by mouth twice weekly on Monday and Thursday per wife, Disp: , Rfl:    zinc  gluconate 50 MG tablet, Take 50 mg by mouth daily., Disp: , Rfl:       Objective:    BP 110/70   Pulse 76   Temp 98.1 F (36.7 C)   Ht 5' 9 (1.753 m)   Wt 253 lb 9.6 oz (115 kg)   SpO2 94%   BMI 37.45 kg/m   Wt Readings from Last 3 Encounters:  05/15/24 253 lb 9.6 oz (115 kg)  04/15/24 250 lb (113.4 kg)  03/10/24 259 lb (117.5 kg)    Physical Exam    General: No acute distress. Awake and conversant. +obese Eyes: Normal conjunctiva, anicteric. Round symmetric pupils.  ENT: Hearing grossly intact. No nasal discharge.  Neck: Neck is supple. No masses or thyromegaly.  Respiratory: CTAB. Respirations are non-labored. No wheezing.  Skin: Warm. No rashes or ulcers.  Psych: Alert and oriented. Cooperative, Appropriate mood and affect, Normal judgment.  CV: RRR. No murmur. No lower extremity edema.  MSK: Normal ambulation.  Neuro:  CN II-XII grossly normal.    Assessment & Plan:  Anemia, unspecified type Assessment & Plan: Check CBC today.  Orders: -     CBC with Differential/Platelet  Chronic gout involving toe without tophus, unspecified cause, unspecified laterality Assessment & Plan: Denies recent flares. Hydrate and monitor.  Orders: -     Uric acid  Hypothyroidism, unspecified type Assessment & Plan: On Levothyroxine , continue to monitor  Orders: -     TSH  Primary hypertension Assessment & Plan: Well controlled, no changes to meds. Encouraged heart healthy diet such as the DASH diet and exercise as tolerated.      Mixed hyperlipidemia Assessment & Plan: History of hyperlipidemia on high-dose atorvastatin  and Zetia  with lipid profile performed 09/19/2023 revealing total cholesterol 122, LDL 73 and HDL 28. Update labs today.   Orders: -     Lipid panel -     Comprehensive  metabolic panel with GFR  Frequent urination -     POCT urinalysis dipstick -     Urine Culture  Diabetes mellitus type 2 with neurological manifestations Assessment & Plan: Update hgba1c today.  Minimize simple carbs. Increase exercise as tolerated. Continue current meds.  Orders: -     Microalbumin / creatinine urine ratio -     Hemoglobin A1c  Centrilobular emphysema (HCC) Assessment & Plan: No recent exacerbations.     I have independently evaluated patient.  [name] is a 78 y.o. male who is low/moderate risk for a intermediate risk surgery.   [name]'s RCRI/NSQIP calculation for MACE is: 2 points RCRI Score 5 %  Reviewed medications Pt instructed to stop Plavix  and aspirin   05/15/2024, can resume medications after Sx.   Portions of this note were dictated using DRAGON voice recognition software. Please disregard any errors in transcription.    Krissy Orebaugh L. Verania Salberg, DNP, AGNP-C

## 2024-05-15 NOTE — Assessment & Plan Note (Signed)
 Check CBC today.

## 2024-05-15 NOTE — Assessment & Plan Note (Signed)
 Denies recent flares. Hydrate and monitor.

## 2024-05-15 NOTE — Telephone Encounter (Signed)
 Returned [place] call and no answer also phone disconnected and could not leave a message.

## 2024-05-15 NOTE — Assessment & Plan Note (Addendum)
 No recent exacerbations.

## 2024-05-15 NOTE — Assessment & Plan Note (Signed)
 Update hgba1c today.  Minimize simple carbs. Increase exercise as tolerated. Continue current meds.

## 2024-05-16 LAB — URINE CULTURE
MICRO NUMBER:: 16687885
Result:: NO GROWTH
SPECIMEN QUALITY:: ADEQUATE

## 2024-05-17 ENCOUNTER — Ambulatory Visit: Payer: Self-pay | Admitting: Student

## 2024-05-18 ENCOUNTER — Encounter (HOSPITAL_COMMUNITY): Payer: Self-pay | Admitting: Podiatry

## 2024-05-18 ENCOUNTER — Other Ambulatory Visit: Payer: Self-pay

## 2024-05-18 DIAGNOSIS — E119 Type 2 diabetes mellitus without complications: Secondary | ICD-10-CM | POA: Diagnosis not present

## 2024-05-18 NOTE — Progress Notes (Signed)
 SDW CALL  Patient was given pre-op instructions over the phone. The opportunity was given for the patient to ask questions. No further questions asked. Patient verbalized understanding of instructions given.   PCP - Glade Domenica COME Cardiologist - [address] COME  PPM/ICD - denies Device Orders -  Rep Notified -   Chest x-ray -  EKG - 03/10/24 Stress Test - 02/04/24 ECHO - 02/21/24 Cardiac Cath - 05/07/2005  Sleep Study - 12/05/21 CPAP - no  Fasting Blood Sugar - 140's Checks Blood Sugar two times a day  WHAT DO I DO ABOUT MY DIABETES MEDICATION?   Do not take oral diabetes medicines (pills) the morning of surgery. Do not take glimepiride (Amaryl ) the morning of surgery. Do not take pioglitazone (Actos ) the morning of surgery.  If your CBG is greater than 220 mg/dL the morning of surgery you may take  of your ) dose of lispro(humalog  kwikpen) insulin . If your CBG is less than 220, do not take any lispro (humalog  kwikpen)insulin .  Check your blood sugar the morning of your surgery when you wake up and every 2 hours until you get to the Short Stay unit.  If your blood sugar is less than 70 mg/dL, you will need to treat for low blood sugar: Do not take insulin . Treat a low blood sugar (less than 70 mg/dL) with  cup of clear juice (cranberry or apple), 4 glucose tablets, OR glucose gel. Recheck blood sugar in 15 minutes after treatment (to make sure it is greater than 70 mg/dL). If your blood sugar is not greater than 70 mg/dL on recheck, call [phone] for further instructions. Report your blood sugar to the short stay nurse when you get to Short Stay.  Blood Thinner Instructions:stop Plavix  05/15/24 per medical provider,Jessica Yacopino,DNP Aspirin  Instructions:stop aspirin  05/15/24 per medical provider Harlene Vertell CHOL  COVID TEST- na   Anesthesia review: Dr. Gershon order. Pt has history of COPD,OSA,HTN,DM. Medical clearance received 05/15/24. Uses O2 4L [place] Guerra at home  PRN.  Patient denies shortness of breath, fever, cough and chest pain over the phone call    As of today, STOP taking any Aspirin  (unless otherwise instructed by your surgeon) Aleve, Naproxen, Ibuprofen, Motrin, Advil, Goody's, BC's, all herbal medications, fish oil, and all vitamins.   Special instructions:    Oral Hygiene is also important to reduce your risk of infection.  Remember - BRUSH YOUR TEETH THE MORNING OF SURGERY WITH YOUR REGULAR TOOTHPASTE

## 2024-05-19 NOTE — Anesthesia Preprocedure Evaluation (Signed)
 Anesthesia Evaluation  Patient identified by MRN, date of birth, ID band Patient awake    Reviewed: Allergy & Precautions, H&P , NPO status , Patient's Chart, lab work & pertinent test results  Airway Mallampati: II  TM Distance: >3 FB Neck ROM: Full    Dental no notable dental hx. (+) Edentulous Upper, Edentulous Lower, Dental Advisory Given   Pulmonary asthma , sleep apnea , COPD,  COPD inhaler, former smoker   Pulmonary exam normal breath sounds clear to auscultation       Cardiovascular hypertension, Pt. on medications and Pt. on home beta blockers + Peripheral Vascular Disease  + dysrhythmias  Rhythm:Regular Rate:Normal     Neuro/Psych   Anxiety Depression   Dementia CVA    GI/Hepatic Neg liver ROS,GERD  ,,  Endo/Other  diabetes, Oral Hypoglycemic Agents, Insulin  DependentHypothyroidism  Class 3 obesity  Renal/GU Renal InsufficiencyRenal disease  negative genitourinary   Musculoskeletal  (+) Arthritis , Osteoarthritis,    Abdominal   Peds  Hematology  (+) Blood dyscrasia, anemia   Anesthesia Other Findings   Reproductive/Obstetrics negative OB ROS                              Anesthesia Physical Anesthesia Plan  ASA: 3  Anesthesia Plan: MAC   Post-op Pain Management: Ofirmev  IV (intra-op)*   Induction: Intravenous  PONV Risk Score and Plan: 2 and Propofol  infusion and Ondansetron   Airway Management Planned: Natural Airway and Simple Face Mask  Additional Equipment:   Intra-op Plan:   Post-operative Plan:   Informed Consent: I have reviewed the patients History and Physical, chart, labs and discussed the procedure including the risks, benefits and alternatives for the proposed anesthesia with the patient or authorized representative who has indicated his/her understanding and acceptance.     Dental advisory given  Plan Discussed with: CRNA  Anesthesia Plan  Comments: (PAT note written 05/19/2024 by Kethan Papadopoulos, PA-C.  )         Anesthesia Quick Evaluation
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
## 2024-05-19 NOTE — Telephone Encounter (Signed)
 Tried [employer] and no answer and then phone said good bye.

## 2024-05-20 ENCOUNTER — Encounter (HOSPITAL_COMMUNITY)
Admission: RE | Disposition: A | Discharge status: Home / Self Care | Payer: Self-pay | Source: Home / Self Care | Attending: Hospitalist

## 2024-05-20 ENCOUNTER — Encounter (HOSPITAL_COMMUNITY): Payer: Self-pay | Admitting: Podiatry

## 2024-05-20 ENCOUNTER — Other Ambulatory Visit: Payer: Self-pay

## 2024-05-20 ENCOUNTER — Observation Stay (HOSPITAL_COMMUNITY)
Admission: RE | Admit: 2024-05-20 | Discharge: 2024-05-21 | Disposition: A | Discharge status: Home / Self Care | Attending: Internal Medicine | Admitting: Internal Medicine

## 2024-05-20 ENCOUNTER — Ambulatory Visit (HOSPITAL_BASED_OUTPATIENT_CLINIC_OR_DEPARTMENT_OTHER): Admitting: Vascular Surgery

## 2024-05-20 ENCOUNTER — Ambulatory Visit (HOSPITAL_COMMUNITY): Admitting: Vascular Surgery

## 2024-05-20 ENCOUNTER — Ambulatory Visit (HOSPITAL_COMMUNITY)

## 2024-05-20 DIAGNOSIS — Z7982 Long term (current) use of aspirin: Secondary | ICD-10-CM | POA: Insufficient documentation

## 2024-05-20 DIAGNOSIS — L97529 Non-pressure chronic ulcer of other part of left foot with unspecified severity: Secondary | ICD-10-CM | POA: Diagnosis not present

## 2024-05-20 DIAGNOSIS — I10 Essential (primary) hypertension: Secondary | ICD-10-CM | POA: Diagnosis not present

## 2024-05-20 DIAGNOSIS — Z89432 Acquired absence of left foot: Principal | ICD-10-CM | POA: Insufficient documentation

## 2024-05-20 DIAGNOSIS — Z9889 Other specified postprocedural states: Secondary | ICD-10-CM | POA: Diagnosis not present

## 2024-05-20 DIAGNOSIS — J9611 Chronic respiratory failure with hypoxia: Secondary | ICD-10-CM | POA: Diagnosis not present

## 2024-05-20 DIAGNOSIS — E66812 Obesity, class 2: Secondary | ICD-10-CM | POA: Diagnosis not present

## 2024-05-20 DIAGNOSIS — J449 Chronic obstructive pulmonary disease, unspecified: Secondary | ICD-10-CM | POA: Insufficient documentation

## 2024-05-20 DIAGNOSIS — E1149 Type 2 diabetes mellitus with other diabetic neurological complication: Secondary | ICD-10-CM | POA: Diagnosis not present

## 2024-05-20 DIAGNOSIS — Z794 Long term (current) use of insulin: Secondary | ICD-10-CM | POA: Insufficient documentation

## 2024-05-20 DIAGNOSIS — Z6836 Body mass index (BMI) 36.0-36.9, adult: Secondary | ICD-10-CM | POA: Diagnosis not present

## 2024-05-20 DIAGNOSIS — M19072 Primary osteoarthritis, left ankle and foot: Secondary | ICD-10-CM | POA: Diagnosis not present

## 2024-05-20 DIAGNOSIS — E1169 Type 2 diabetes mellitus with other specified complication: Secondary | ICD-10-CM | POA: Diagnosis not present

## 2024-05-20 DIAGNOSIS — Z89422 Acquired absence of other left toe(s): Secondary | ICD-10-CM | POA: Diagnosis not present

## 2024-05-20 DIAGNOSIS — Z7984 Long term (current) use of oral hypoglycemic drugs: Secondary | ICD-10-CM | POA: Diagnosis not present

## 2024-05-20 DIAGNOSIS — M86672 Other chronic osteomyelitis, left ankle and foot: Principal | ICD-10-CM | POA: Insufficient documentation

## 2024-05-20 DIAGNOSIS — M86172 Other acute osteomyelitis, left ankle and foot: Secondary | ICD-10-CM | POA: Diagnosis not present

## 2024-05-20 HISTORY — PX: TRANSMETATARSAL AMPUTATION: SHX6197

## 2024-05-20 LAB — GLUCOSE, CAPILLARY
Glucose-Capillary: 154 mg/dL — ABNORMAL HIGH (ref 70–99)
Glucose-Capillary: 136 mg/dL — ABNORMAL HIGH (ref 70–99)
Glucose-Capillary: 133 mg/dL — ABNORMAL HIGH (ref 70–99)

## 2024-05-20 SURGERY — AMPUTATION, FOOT, TRANSMETATARSAL
Anesthesia: Monitor Anesthesia Care | Site: Toe | Laterality: Left

## 2024-05-20 MED ORDER — LOSARTAN POTASSIUM 50 MG PO TABS
50.0000 mg | ORAL_TABLET | Freq: Every day | ORAL | Status: DC
  Administered 2024-05-21: 50 mg via ORAL
  Filled 2024-05-20: qty 1

## 2024-05-20 MED ORDER — LIDOCAINE 2% (20 MG/ML) 5 ML SYRINGE
INTRAMUSCULAR | Status: DC | PRN
  Administered 2024-05-20: 40 mg via INTRAVENOUS

## 2024-05-20 MED ORDER — THROMBIN 5000 UNITS EX KIT
PACK | CUTANEOUS | Status: AC
  Filled 2024-05-20: qty 1

## 2024-05-20 MED ORDER — BUPIVACAINE HCL (PF) 0.5 % IJ SOLN
INTRAMUSCULAR | Status: AC
  Filled 2024-05-20: qty 30

## 2024-05-20 MED ORDER — EPHEDRINE SULFATE (PRESSORS) 50 MG/ML IJ SOLN
INTRAMUSCULAR | Status: DC | PRN
Start: 2024-05-20 — End: 2024-05-20
  Administered 2024-05-20 (×2): 5 mg via INTRAVENOUS

## 2024-05-20 MED ORDER — HEMOSTATIC AGENTS (NO CHARGE) OPTIME
TOPICAL | Status: DC | PRN
  Administered 2024-05-20: 1 via TOPICAL

## 2024-05-20 MED ORDER — DEXMEDETOMIDINE HCL IN NACL 80 MCG/20ML IV SOLN
INTRAVENOUS | Status: DC | PRN
  Administered 2024-05-20: 8 ug via INTRAVENOUS
  Administered 2024-05-20: 4 ug via INTRAVENOUS

## 2024-05-20 MED ORDER — PROPOFOL 500 MG/50ML IV EMUL
INTRAVENOUS | Status: DC | PRN
  Administered 2024-05-20 (×2): 10 mg via INTRAVENOUS
  Administered 2024-05-20: 50 ug/kg/min via INTRAVENOUS

## 2024-05-20 MED ORDER — PHENYLEPHRINE HCL-NACL 20-0.9 MG/250ML-% IV SOLN
INTRAVENOUS | Status: DC | PRN
  Administered 2024-05-20: 80 ug via INTRAVENOUS
  Administered 2024-05-20: 25 ug/min via INTRAVENOUS
  Administered 2024-05-20 (×3): 80 ug via INTRAVENOUS

## 2024-05-20 MED ORDER — CARVEDILOL 25 MG PO TABS
12.5000 mg | ORAL_TABLET | Freq: Two times a day (BID) | ORAL | Status: DC
Start: 2024-05-20 — End: 2024-05-21
  Administered 2024-05-20 – 2024-05-21 (×2): 12.5 mg via ORAL
  Filled 2024-05-20 (×2): qty 1

## 2024-05-20 MED ORDER — LEVOTHYROXINE SODIUM 25 MCG PO TABS
25.0000 ug | ORAL_TABLET | Freq: Every day | ORAL | Status: DC
  Administered 2024-05-21: 25 ug via ORAL
  Filled 2024-05-20: qty 1

## 2024-05-20 MED ORDER — PREDNISOLONE ACETATE 1 % OP SUSP
1.0000 [drp] | Freq: Two times a day (BID) | OPHTHALMIC | Status: DC
  Administered 2024-05-20 – 2024-05-21 (×2): 1 [drp] via OPHTHALMIC
  Filled 2024-05-20: qty 5

## 2024-05-20 MED ORDER — CHLORHEXIDINE GLUCONATE CLOTH 2 % EX PADS: 6.0000 | MEDICATED_PAD | Freq: Once | CUTANEOUS | Status: DC

## 2024-05-20 MED ORDER — LACTATED RINGERS IV SOLN: INTRAVENOUS | Status: DC

## 2024-05-20 MED ORDER — BUDESONIDE 0.25 MG/2ML IN SUSP
0.2500 mg | Freq: Two times a day (BID) | RESPIRATORY_TRACT | Status: DC
  Administered 2024-05-20 – 2024-05-21 (×2): 0.25 mg via RESPIRATORY_TRACT
  Filled 2024-05-20 (×2): qty 2

## 2024-05-20 MED ORDER — LATANOPROST 0.005 % OP SOLN
1.0000 [drp] | Freq: Every day | OPHTHALMIC | Status: DC
  Administered 2024-05-20: 1 [drp] via OPHTHALMIC
  Filled 2024-05-20: qty 2.5

## 2024-05-20 MED ORDER — ARFORMOTEROL TARTRATE 15 MCG/2ML IN NEBU
15.0000 ug | INHALATION_SOLUTION | Freq: Two times a day (BID) | RESPIRATORY_TRACT | Status: DC
  Administered 2024-05-20 – 2024-05-21 (×2): 15 ug via RESPIRATORY_TRACT
  Filled 2024-05-20 (×2): qty 2

## 2024-05-20 MED ORDER — DEXMEDETOMIDINE HCL IN NACL 80 MCG/20ML IV SOLN
INTRAVENOUS | Status: AC
Start: 2024-05-20 — End: 2024-05-20
  Filled 2024-05-20: qty 20

## 2024-05-20 MED ORDER — FLUOXETINE HCL 20 MG PO CAPS
40.0000 mg | ORAL_CAPSULE | Freq: Every day | ORAL | Status: DC
  Administered 2024-05-21: 40 mg via ORAL
  Filled 2024-05-20: qty 2

## 2024-05-20 MED ORDER — 0.9 % SODIUM CHLORIDE (POUR BTL) OPTIME
TOPICAL | Status: DC | PRN
  Administered 2024-05-20: 1000 mL

## 2024-05-20 MED ORDER — FENTANYL CITRATE (PF) 100 MCG/2ML IJ SOLN: 25.0000 ug | INTRAMUSCULAR | Status: DC | PRN

## 2024-05-20 MED ORDER — LIDOCAINE 2% (20 MG/ML) 5 ML SYRINGE
INTRAMUSCULAR | Status: AC
  Filled 2024-05-20: qty 5

## 2024-05-20 MED ORDER — ACETAMINOPHEN 500 MG PO TABS
1000.0000 mg | ORAL_TABLET | Freq: Three times a day (TID) | ORAL | Status: DC
  Administered 2024-05-20 – 2024-05-21 (×3): 1000 mg via ORAL
  Filled 2024-05-20 (×3): qty 2

## 2024-05-20 MED ORDER — OXYCODONE HCL 5 MG PO TABS
10.0000 mg | ORAL_TABLET | ORAL | Status: DC | PRN
  Administered 2024-05-20 – 2024-05-21 (×3): 10 mg via ORAL
  Filled 2024-05-20 (×3): qty 2

## 2024-05-20 MED ORDER — LIDOCAINE HCL 2 % IJ SOLN
INTRAMUSCULAR | Status: DC | PRN
  Administered 2024-05-20: 10 mL

## 2024-05-20 MED ORDER — ORAL CARE MOUTH RINSE: 15.0000 mL | Freq: Once | OROMUCOSAL | Status: AC

## 2024-05-20 MED ORDER — CLOPIDOGREL BISULFATE 75 MG PO TABS
75.0000 mg | ORAL_TABLET | Freq: Every day | ORAL | Status: DC
  Administered 2024-05-21: 75 mg via ORAL
  Filled 2024-05-20: qty 1

## 2024-05-20 MED ORDER — DORZOLAMIDE HCL-TIMOLOL MAL 2-0.5 % OP SOLN
1.0000 [drp] | Freq: Every day | OPHTHALMIC | Status: DC
  Administered 2024-05-21: 1 [drp] via OPHTHALMIC
  Filled 2024-05-20: qty 10

## 2024-05-20 MED ORDER — CHLORHEXIDINE GLUCONATE 0.12 % MT SOLN
15.0000 mL | Freq: Once | OROMUCOSAL | Status: AC
  Administered 2024-05-20: 15 mL via OROMUCOSAL
  Filled 2024-05-20: qty 15

## 2024-05-20 MED ORDER — CEFAZOLIN SODIUM-DEXTROSE 2-4 GM/100ML-% IV SOLN
2.0000 g | INTRAVENOUS | Status: AC
Start: 2024-05-20 — End: 2024-05-20
  Administered 2024-05-20: 2 g via INTRAVENOUS
  Filled 2024-05-20: qty 100

## 2024-05-20 MED ORDER — OXYCODONE HCL 5 MG PO TABS: 5.0000 mg | ORAL_TABLET | ORAL | Status: DC | PRN

## 2024-05-20 MED ORDER — ONDANSETRON HCL 4 MG/2ML IJ SOLN
INTRAMUSCULAR | Status: AC
  Filled 2024-05-20: qty 2

## 2024-05-20 MED ORDER — LIDOCAINE HCL 2 % IJ SOLN
INTRAMUSCULAR | Status: AC
  Filled 2024-05-20: qty 20

## 2024-05-20 MED ORDER — BUPIVACAINE HCL (PF) 0.5 % IJ SOLN
INTRAMUSCULAR | Status: DC | PRN
  Administered 2024-05-20: 10 mL

## 2024-05-20 MED ORDER — PREGABALIN 75 MG PO CAPS
150.0000 mg | ORAL_CAPSULE | Freq: Two times a day (BID) | ORAL | Status: DC
  Administered 2024-05-20 – 2024-05-21 (×2): 150 mg via ORAL
  Filled 2024-05-20 (×2): qty 2

## 2024-05-20 MED ORDER — PHENYLEPHRINE 80 MCG/ML (10ML) SYRINGE FOR IV PUSH (FOR BLOOD PRESSURE SUPPORT)
PREFILLED_SYRINGE | INTRAVENOUS | Status: AC
  Filled 2024-05-20: qty 10

## 2024-05-20 MED ORDER — THROMBIN 5000 UNITS EX SOLR
CUTANEOUS | Status: DC | PRN
  Administered 2024-05-20: 5000 [IU] via TOPICAL

## 2024-05-20 MED ORDER — ONDANSETRON HCL 4 MG/2ML IJ SOLN
INTRAMUSCULAR | Status: DC | PRN
  Administered 2024-05-20: 4 mg via INTRAVENOUS

## 2024-05-20 MED ORDER — ALPRAZOLAM 0.5 MG PO TABS: 0.5000 mg | ORAL_TABLET | Freq: Two times a day (BID) | ORAL | Status: DC | PRN

## 2024-05-20 MED ORDER — ATORVASTATIN CALCIUM 80 MG PO TABS
80.0000 mg | ORAL_TABLET | Freq: Every day | ORAL | Status: DC
  Administered 2024-05-21: 80 mg via ORAL
  Filled 2024-05-20: qty 1

## 2024-05-20 MED ORDER — METHOCARBAMOL 500 MG PO TABS
1000.0000 mg | ORAL_TABLET | Freq: Three times a day (TID) | ORAL | Status: DC
  Administered 2024-05-20 – 2024-05-21 (×3): 1000 mg via ORAL
  Filled 2024-05-20 (×3): qty 2

## 2024-05-20 MED ORDER — ASPIRIN 81 MG PO TBEC
81.0000 mg | DELAYED_RELEASE_TABLET | Freq: Every day | ORAL | Status: DC
  Administered 2024-05-20: 81 mg via ORAL
  Filled 2024-05-20: qty 1

## 2024-05-20 MED ORDER — IPRATROPIUM-ALBUTEROL 0.5-2.5 (3) MG/3ML IN SOLN: 3.0000 mL | Freq: Four times a day (QID) | RESPIRATORY_TRACT | Status: DC | PRN

## 2024-05-20 SURGICAL SUPPLY — 33 items
BAG COUNTER SPONGE SURGICOUNT (BAG) ×1 IMPLANT
BLADE LONG MED 31X9 (MISCELLANEOUS) IMPLANT
BNDG COMPR ESMARK 4X3 LF (GAUZE/BANDAGES/DRESSINGS) ×1 IMPLANT
BNDG ELASTIC 3INX 5YD STR LF (GAUZE/BANDAGES/DRESSINGS) ×1 IMPLANT
BNDG ELASTIC 4INX 5YD STR LF (GAUZE/BANDAGES/DRESSINGS) IMPLANT
BNDG ELASTIC 6X10 VLCR STRL LF (GAUZE/BANDAGES/DRESSINGS) IMPLANT
BNDG GAUZE DERMACEA FLUFF 4 (GAUZE/BANDAGES/DRESSINGS) ×1 IMPLANT
CUFF TOURN SGL QUICK 18X4 (TOURNIQUET CUFF) IMPLANT
DRSG EMULSION OIL 3X3 NADH (GAUZE/BANDAGES/DRESSINGS) ×1 IMPLANT
DURAPREP 26ML APPLICATOR (WOUND CARE) ×1 IMPLANT
ELECTRODE REM PT RTRN 9FT ADLT (ELECTROSURGICAL) ×1 IMPLANT
GAUZE PAD ABD 8X10 STRL (GAUZE/BANDAGES/DRESSINGS) IMPLANT
GAUZE SPONGE 4X4 12PLY STRL (GAUZE/BANDAGES/DRESSINGS) ×1 IMPLANT
GAUZE STRETCH 2X75IN STRL (MISCELLANEOUS) ×1 IMPLANT
GAUZE XEROFORM 5X9 LF (GAUZE/BANDAGES/DRESSINGS) IMPLANT
GLOVE BIO SURGEON STRL SZ8 (GLOVE) ×2 IMPLANT
GOWN STRL REUS W/ TWL LRG LVL3 (GOWN DISPOSABLE) ×1 IMPLANT
GOWN STRL REUS W/ TWL XL LVL3 (GOWN DISPOSABLE) ×1 IMPLANT
KIT BASIN OR (CUSTOM PROCEDURE TRAY) ×1 IMPLANT
NDL HYPO 25X1 1.5 SAFETY (NEEDLE) ×1 IMPLANT
NEEDLE HYPO 25X1 1.5 SAFETY (NEEDLE) ×1 IMPLANT
NS IRRIG 1000ML POUR BTL (IV SOLUTION) IMPLANT
PACK ORTHO EXTREMITY (CUSTOM PROCEDURE TRAY) ×1 IMPLANT
SPONGE SURGIFOAM ABS GEL SZ50 (HEMOSTASIS) IMPLANT
STAPLER SKIN PROX WIDE 3.9 (STAPLE) IMPLANT
SUCTION TUBE FRAZIER 10FR DISP (SUCTIONS) ×1 IMPLANT
SUT ETHILON 3 0 PS 1 (SUTURE) IMPLANT
SUT PROLENE 3 0 PS 2 (SUTURE) ×1 IMPLANT
SUT VIC AB 3-0 FS2 27 (SUTURE) IMPLANT
SYR 10ML LL (SYRINGE) IMPLANT
TUBE CONNECTING 12X1/4 (SUCTIONS) ×1 IMPLANT
UNDERPAD 30X36 HEAVY ABSORB (UNDERPADS AND DIAPERS) ×1 IMPLANT
YANKAUER SUCT BULB TIP NO VENT (SUCTIONS) IMPLANT
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
## 2024-05-20 NOTE — Brief Op Note (Signed)
 05/20/2024  12:18 PM  PATIENT:  [name]  78 y.o. male  PRE-OPERATIVE DIAGNOSIS:  M86.672  POST-OPERATIVE DIAGNOSIS:  M86.672  PROCEDURE:  Procedure(s): AMPUTATION, FOOT, TRANSMETATARSAL (Left)  SURGEON:  Surgeons and Role:    DEWAINE Gershon Donnice JONELLE, DPM - Primary  PHYSICIAN ASSISTANT:   ASSISTANTS: none   ANESTHESIA:   MAC  EBL:  100 mL   BLOOD ADMINISTERED:none  DRAINS: none   LOCAL MEDICATIONS USED:  MARCAINE    , BUPIVICAINE , and Amount: 20 ml  SPECIMEN:  Left foot TMA  DISPOSITION OF SPECIMEN:  PATHOLOGY  COUNTS:  YES  TOURNIQUET:  * Missing tourniquet times found for documented tourniquets in log: [id number] *  DICTATION: .[place] Dictation  PLAN OF CARE: Admit to inpatient   PATIENT DISPOSITION:  PACU - hemodynamically stable.   Delay start of Pharmacological VTE agent (>24hrs) due to surgical blood loss or risk of bleeding: no  Intraoperative findings: S/p TMA left foot. Had good bleeding. No purulence and no signs of proximal infection. Hemostasis achieved. Wound closed without tension.  Postop course: To admit for at least overnight observation. Will need PT consult tomorrow for NWB. Pending pain control and PT he can be discharged from our standpoint.

## 2024-05-20 NOTE — Progress Notes (Signed)
 Orthopedic Tech Progress Note Patient Details:  [name] Apr 05, 1946 [id number]  Ortho Devices Type of Ortho Device: CAM walker Ortho Device/Splint Location: LLE Ortho Device/Splint Interventions: Ordered, Application   Post Interventions Patient Tolerated: Well  [name] 05/20/2024, 1:13 PM

## 2024-05-20 NOTE — Transfer of Care (Cosign Needed)
 Immediate Anesthesia Transfer of Care Note  Patient: [name]  Procedure(s) Performed: AMPUTATION, FOOT, TRANSMETATARSAL (Left: Toe)  Patient Location: PACU  Anesthesia Type:MAC  Level of Consciousness: awake, alert , and oriented  Airway & Oxygen  Therapy: Patient Spontanous Breathing and Patient connected to face mask oxygen   Post-op Assessment: Report given to RN and Post -op Vital signs reviewed and stable  Post vital signs: Reviewed and stable  Last Vitals:  Vitals Value Taken Time  BP 113/59 05/20/24 12:19  Temp 37.1 C 05/20/24 12:15  Pulse 59 05/20/24 12:22  Resp 19 05/20/24 12:22  SpO2 96 % 05/20/24 12:22  Vitals shown include unfiled device data.  Last Pain:  Vitals:   05/20/24 0940  TempSrc:   PainSc: 0-No pain         Complications: No notable events documented.

## 2024-05-20 NOTE — Anesthesia Postprocedure Evaluation (Signed)
 Anesthesia Post Note  Patient: [name]  Procedure(s) Performed: AMPUTATION, FOOT, TRANSMETATARSAL (Left: Toe)     Patient location during evaluation: PACU Anesthesia Type: MAC Level of consciousness: awake and alert Pain management: pain level controlled Vital Signs Assessment: post-procedure vital signs reviewed and stable Respiratory status: spontaneous breathing, nonlabored ventilation and respiratory function stable Cardiovascular status: stable and blood pressure returned to baseline Postop Assessment: no apparent nausea or vomiting Anesthetic complications: no   No notable events documented.  Last Vitals:  Vitals:   05/20/24 1430 05/20/24 1507  BP: 129/76 128/69  Pulse: 61 61  Resp: 16 18  Temp:    SpO2: 94% 96%    Last Pain:  Vitals:   05/20/24 1400  TempSrc:   PainSc: 0-No pain                 Lichelle Viets,W. EDMOND

## 2024-05-20 NOTE — Plan of Care (Signed)

## 2024-05-20 NOTE — Interval H&P Note (Signed)
 History and Physical Interval Note:  05/20/2024 10:56 AM  [name]  has presented today for surgery, with the diagnosis of ulcer and chronic osteomyelitis.  The various methods of treatment have been discussed with the patient and family. After consideration of risks, benefits and other options for treatment, the patient has consented to  Procedure(s): AMPUTATION, FOOT, TRANSMETATARSAL (Left) as a surgical intervention.  The patient's history has been reviewed, patient examined, no change in status, stable for surgery.  I have reviewed the patient's chart and labs.  Questions were answered to the patient's satisfaction.     [name]

## 2024-05-21 ENCOUNTER — Encounter: Payer: Self-pay | Admitting: Podiatry

## 2024-05-21 ENCOUNTER — Telehealth: Payer: Self-pay | Admitting: Podiatry

## 2024-05-21 ENCOUNTER — Encounter (HOSPITAL_COMMUNITY): Payer: Self-pay | Admitting: Podiatry

## 2024-05-21 DIAGNOSIS — J9611 Chronic respiratory failure with hypoxia: Secondary | ICD-10-CM | POA: Diagnosis not present

## 2024-05-21 DIAGNOSIS — Z89432 Acquired absence of left foot: Secondary | ICD-10-CM | POA: Diagnosis not present

## 2024-05-21 DIAGNOSIS — E66812 Obesity, class 2: Secondary | ICD-10-CM

## 2024-05-21 DIAGNOSIS — I10 Essential (primary) hypertension: Secondary | ICD-10-CM

## 2024-05-21 DIAGNOSIS — J432 Centrilobular emphysema: Secondary | ICD-10-CM | POA: Diagnosis not present

## 2024-05-21 DIAGNOSIS — Z794 Long term (current) use of insulin: Secondary | ICD-10-CM | POA: Diagnosis not present

## 2024-05-21 DIAGNOSIS — Z7982 Long term (current) use of aspirin: Secondary | ICD-10-CM | POA: Diagnosis not present

## 2024-05-21 DIAGNOSIS — M86672 Other chronic osteomyelitis, left ankle and foot: Secondary | ICD-10-CM | POA: Diagnosis not present

## 2024-05-21 DIAGNOSIS — E1149 Type 2 diabetes mellitus with other diabetic neurological complication: Secondary | ICD-10-CM | POA: Diagnosis not present

## 2024-05-21 DIAGNOSIS — J449 Chronic obstructive pulmonary disease, unspecified: Secondary | ICD-10-CM | POA: Diagnosis not present

## 2024-05-21 LAB — BASIC METABOLIC PANEL WITH GFR
Anion gap: 9 (ref 5–15)
BUN: 23 mg/dL (ref 8–23)
CO2: 28 mmol/L (ref 22–32)
Calcium: 8.5 mg/dL — ABNORMAL LOW (ref 8.9–10.3)
Chloride: 101 mmol/L (ref 98–111)
Creatinine, Ser: 0.94 mg/dL (ref 0.61–1.24)
GFR, Estimated: >60 mL/min (ref >60)
Glucose, Bld: 188 mg/dL — ABNORMAL HIGH (ref 70–99)
Potassium: 4.2 mmol/L (ref 3.5–5.1)
Sodium: 138 mmol/L (ref 135–145)

## 2024-05-21 LAB — CBC
HCT: 37.4 % — ABNORMAL LOW (ref 39.0–52.0)
Hemoglobin: 12.1 g/dL — ABNORMAL LOW (ref 13.0–17.0)
MCH: 31.8 pg (ref 26.0–34.0)
MCHC: 32.4 g/dL (ref 30.0–36.0)
MCV: 98.2 fL (ref 80.0–100.0)
Platelets: 193 K/uL (ref 150–400)
RBC: 3.81 MIL/uL — ABNORMAL LOW (ref 4.22–5.81)
RDW: 13.2 % (ref 11.5–15.5)
WBC: 9.6 K/uL (ref 4.0–10.5)
nRBC: 0 % (ref 0.0–0.2)

## 2024-05-21 MED ORDER — METHOCARBAMOL 500 MG PO TABS: 1000.0000 mg | ORAL_TABLET | Freq: Three times a day (TID) | ORAL | 0 refills | Status: DC | PRN

## 2024-05-21 MED ORDER — DOXYCYCLINE HYCLATE 100 MG PO CAPS: 100.0000 mg | ORAL_CAPSULE | Freq: Two times a day (BID) | ORAL | 0 refills | Status: DC

## 2024-05-21 MED ORDER — METHOCARBAMOL 1000 MG PO TABS: ORAL_TABLET | ORAL | 0 refills | Status: AC

## 2024-05-21 NOTE — Hospital Course (Addendum)
 HPI: [name] is a 78 y.o. male with medical history significant of CAD, HTN, HLD, hypothyroidism, COPD on 4 L of oxygen  at baseline, PAD s/p left iliac stents, osteomyelitis s/p left hallux amputation in 05/2019, and AAA who is POD0 s/p L TMA w/ Podiatry on 7/16.   Pt is stable in PACU with pain well controlled and on RA. Pt admitted per Podiatry request for pain control and PT/OT eval.  Significant Events: Admitted 05/20/2024 after left Transmetatarsal amputation   Significant Labs:   Significant Imaging Studies:   Antibiotic Therapy: Anti-infectives (From admission, onward)    Start     Dose/Rate Route Frequency Ordered Stop   05/20/24 0915  ceFAZolin  (ANCEF ) IVPB 2g/100 mL premix        2 g 200 mL/hr over 30 Minutes Intravenous On call to O.R. 05/20/24 0914 05/20/24 1119       Procedures: 05-20-2024 left transmetatarsal amputation  Consultants: podiatry

## 2024-05-21 NOTE — Subjective & Objective (Signed)
 Pt seen and examined. Pt has been seen by PT today. Pt does not need any equipment at home.

## 2024-05-21 NOTE — Progress Notes (Addendum)
 PROGRESS NOTE    [name]  FMW:[id number] DOB: 19-Nov-1945 DOA: 05/20/2024 PCP: Domenica Harlene DELENA, MD  Subjective: Pt seen and examined. Pt has been seen by PT today. Pt does not need any equipment at home.   Hospital Course: HPI: [name] is a 78 y.o. male with medical history significant of CAD, HTN, HLD, hypothyroidism, COPD on 4 L of oxygen  at baseline, PAD s/p left iliac stents, osteomyelitis s/p left hallux amputation in 05/2019, and AAA who is POD0 s/p L TMA w/ Podiatry on 7/16.   Pt is stable in PACU with pain well controlled and on RA. Pt admitted per Podiatry request for pain control and PT/OT eval.  Significant Events: Admitted 05/20/2024 after left Transmetatarsal amputation   Significant Labs:   Significant Imaging Studies:   Antibiotic Therapy: Anti-infectives (From admission, onward)    Start     Dose/Rate Route Frequency Ordered Stop   05/20/24 0915  ceFAZolin  (ANCEF ) IVPB 2g/100 mL premix        2 g 200 mL/hr over 30 Minutes Intravenous On call to O.R. 05/20/24 0914 05/20/24 1119       Procedures: 05-20-2024 left transmetatarsal amputation  Consultants: podiatry    Assessment and Plan: * S/P transmetatarsal amputation of foot, left (HCC) 05-21-2024 pain controlled. Wants to stay on hydrocodone . Has enough supply at home. Continue with robaxin  at home. F/u with podiatry. Non-weight bearing on left LE. Has all the DME equipment at home. Verified this with pt and wife. Has RW, WC, BSC at home. Stable for DC.  Chronic respiratory failure with hypoxia (HCC) - on prn 4 L/min O2 05-21-2024 stable. Prn O2 at home  Obesity, Class II, BMI 35-39.9 05-21-2024 Body mass index is 36.92 kg/m.   COPD (chronic obstructive pulmonary disease) 05-21-2024 stable.  Essential hypertension 05-21-2024 stable. Continue home meds  Diabetes mellitus type 2 with neurological manifestations 05-21-2024 stable. Continue insulin , amaryl , actos  at home.    DVT  prophylaxis: SCDs Start: 05/20/24 1256     Code Status: Full Code Family Communication: discussed with pt and wife at bedside Disposition Plan: return home Reason for continuing need for hospitalization: stable for DC.  Objective: Vitals:   05/20/24 2110 05/21/24 0000 05/21/24 0441 05/21/24 0447  BP: (!) 113/53 117/67 134/65   Pulse: 61 (!) 58 67   Resp: 18 18 17    Temp: 98 F (36.7 C) 97.9 F (36.6 C) 97.9 F (36.6 C)   TempSrc: Oral  Oral   SpO2: 95% 93% 93% 97%  Weight:      Height:        Intake/Output Summary (Last 24 hours) at 05/21/2024 9177 Last data filed at 05/21/2024 9365 Gross per 24 hour  Intake 1380 ml  Output 675 ml  Net 705 ml   Filed Weights   05/20/24 0919  Weight: 113.4 kg    Examination:  Physical Exam Vitals and nursing note reviewed.  Constitutional:      General: He is not in acute distress.    Appearance: He is obese. He is not toxic-appearing.  HENT:     Head: Normocephalic and atraumatic.  Eyes:     General: No scleral icterus. Cardiovascular:     Rate and Rhythm: Normal rate and regular rhythm.  Pulmonary:     Effort: Pulmonary effort is normal.     Breath sounds: Normal breath sounds.  Abdominal:     General: Bowel sounds are normal. There is no distension.     Palpations:  Abdomen is soft.  Musculoskeletal:     Comments: Left foot in CAM boot and wrapped in bulky dressing  Skin:    Capillary Refill: Capillary refill takes less than 2 seconds.  Neurological:     General: No focal deficit present.     Mental Status: He is alert and oriented to person, place, and time.     Data Reviewed: I have personally reviewed following labs and imaging studies  CBC: Recent Labs  Lab 05/15/24 1011 05/21/24 0229  WBC 7.1 9.6  NEUTROABS 4.4  --   HGB 13.4 12.1*  HCT 40.3 37.4*  MCV 95.7 98.2  PLT 170.0 193   Basic Metabolic Panel: Recent Labs  Lab 05/15/24 1011 05/21/24 0229  NA 140 138  K 4.8 4.2  CL 101 101  CO2 36* 28   GLUCOSE 141* 188*  BUN 22 23  CREATININE 1.00 0.94  CALCIUM  9.1 8.5*   GFR: Estimated Creatinine Clearance: 83 mL/min (by C-G formula based on SCr of 0.94 mg/dL). Liver Function Tests: Recent Labs  Lab 05/15/24 1011  AST 15  ALT 16  ALKPHOS 93  BILITOT 0.5  PROT 6.3  ALBUMIN 3.9   CBG: Recent Labs  Lab 05/20/24 0925 05/20/24 1104 05/20/24 1222  GLUCAP 154* 136* 133*    Recent Results (from the past 240 hours)  Urine Culture     Status: None   Collection Time: 05/15/24 10:12 AM   Specimen: Urine  Result Value Ref Range Status   MICRO NUMBER: [id number]  Final   SPECIMEN QUALITY: Adequate  Final   Sample Source NOT GIVEN  Final   STATUS: FINAL  Final   Result: No Growth  Final     Radiology Studies: DG Foot 2 Views Left Result Date: 05/20/2024 CLINICAL DATA:  Postoperative EXAM: LEFT FOOT - 2 VIEW COMPARISON:  Left foot x-ray 04/28/2024 FINDINGS: There has been interval amputation at the level of the proximal first through fifth metatarsals. There are overlying skin staples. There is no acute fracture or dislocation. The bones are diffusely osteopenic. Degenerative changes of the talonavicular joint are again seen. Orthopedic hardware seen in the distal tibia and distal fibula. IMPRESSION: Interval amputation at the level of the proximal first through fifth metatarsals. Electronically Signed   By: Greig Pique M.D.   On: 05/20/2024 16:44    Scheduled Meds:  acetaminophen   1,000 mg Oral TID   arformoterol   15 mcg Nebulization BID   aspirin  EC  81 mg Oral QHS   atorvastatin   80 mg Oral Daily   budesonide   0.25 mg Nebulization BID   carvedilol   12.5 mg Oral BID WC   clopidogrel   75 mg Oral Daily   dorzolamide -timolol   1 drop Both Eyes Daily   FLUoxetine   40 mg Oral Daily   latanoprost   1 drop Both Eyes QHS   levothyroxine   25 mcg Oral Q0600   losartan   50 mg Oral Daily   methocarbamol   1,000 mg Oral TID   prednisoLONE  acetate  1 drop Right Eye BID   pregabalin    150 mg Oral BID   Continuous Infusions:   LOS: 0 days   Time spent: 55 minutes  Camellia Door, DO  [facility]  05/21/2024, 8:22 AM

## 2024-05-21 NOTE — Evaluation (Signed)
 Physical Therapy Evaluation Patient Details Name: [name] MRN: [id number] DOB: 1946-01-21 Today's Date: 05/21/2024  History of Present Illness  Pt is a 78 y/o M s/p L TMA w/ Podiatry on 7/16. PMH includes CAD, HTN, HLD, hypothyroidism, COPD on 4 L of oxygen  at baseline, PAD s/p left iliac stents, osteomyelitis s/p left hallux amputation in 05/2019, and AAA  Clinical Impression  Received pt semi-reclined in bed with wife at bedside. Pt on 4L SPO2 at baseline and reports being mod I with RW/SPC prior to admission and has WC at home. Pt has 6 STE with 2 handrails, but alternative entrance with ramp (although wife reports front porch where ramp is located needs to be redone) - pt reports it is sturdy enough for him to be wheeled into the house in a WC. Pt performed bed mobility mod I and transfers from elevated EOB (to simulate bed height at home) with RW and CGA. Pt with difficulty holding LLE up, demonstrating TDWB rather than NWB on LLE. Pt ambulated 60ft forwards and 26ft backwards with RW and CGA, initially placing weight through LLE, progressing to TDWB (pt verbalized not placing any weight through LLE, stating L foot was just resting on floor). Pt fatigued after ambulation. Recommend HHPT services to address current strength, endurance, and balance deficits. Acute PT to cont to follow.       If plan is discharge home, recommend the following: A little help with walking and/or transfers;A little help with bathing/dressing/bathroom;Assist for transportation;Help with stairs or ramp for entrance;Assistance with cooking/housework   Can travel by private vehicle        Equipment Recommendations None recommended by PT (pt reports having RW and WC at home)  Recommendations for Other Services       Functional Status Assessment Patient has had a recent decline in their functional status and demonstrates the ability to make significant improvements in function in a reasonable and predictable  amount of time.     Precautions / Restrictions Precautions Precautions: Fall Recall of Precautions/Restrictions: Intact Precaution/Restrictions Comments: LLE CAM boot Restrictions Weight Bearing Restrictions Per Provider Order: Yes LLE Weight Bearing Per Provider Order: Non weight bearing      Mobility  Bed Mobility Overal bed mobility: Modified Independent             General bed mobility comments: HOB elevated and use of bedrails - discussed getting bed assist rail at home Patient Response: Cooperative  Transfers Overall transfer level: Needs assistance Equipment used: Rolling walker (2 wheels) Transfers: Sit to/from Stand Sit to Stand: From elevated surface, Contact guard assist           General transfer comment: stood from elevated EOB x 2 trials with CGA and increased effort. Pt unable to hold LLE off ground and demonstrating TDWB rather than NWB    Ambulation/Gait Ambulation/Gait assistance: Contact guard assist Gait Distance (Feet): 4 Feet (40ft forward + 71ft backwards) Assistive device: Rolling walker (2 wheels)   Gait velocity: decreased Gait velocity interpretation: <1.31 ft/sec, indicative of household ambulator   General Gait Details: Pt initally appeared to be placing weight on LLE, with cues able to progress to TDWB rather than NWB but pt verbalized that he was not placing any weight through LLE.  Stairs            Wheelchair Mobility     Tilt Bed Tilt Bed Patient Response: Cooperative  Modified Rankin (Stroke Patients Only)       Balance Overall balance assessment:  Needs assistance Sitting-balance support: Bilateral upper extremity supported, Feet supported Sitting balance-Leahy Scale: Good     Standing balance support: Bilateral upper extremity supported, Reliant on assistive device for balance (RW) Standing balance-Leahy Scale: Fair Standing balance comment: able to maintain static standing balance with supervision, required  CGA for dynamic standing balance                             Pertinent Vitals/Pain Pain Assessment Pain Assessment: 0-10 Pain Score: 4  Pain Location: L heel Pain Descriptors / Indicators: Burning, Discomfort, Sore, Throbbing Pain Intervention(s): Limited activity within patient's tolerance, Monitored during session, Premedicated before session, Repositioned    Home Living Family/patient expects to be discharged to:: Private residence Living Arrangements: Spouse/significant other Available Help at Discharge: Family;Available 24 hours/day Type of Home: House Home Access: Stairs to enter;Ramped entrance (has alternate option of ramp but needs to get front porch fixed) Entrance Stairs-Rails: Right;Left;Can reach both Entrance Stairs-Number of Steps: 6   Home Layout: One level Home Equipment: Rollator (4 wheels);Cane - single Librarian, academic (2 wheels);Wheelchair - manual Additional Comments: Pt reports using RW for long distance prior to admission and SPC for short distances    Prior Function Prior Level of Function : Independent/Modified Independent;Driving                     Extremity/Trunk Assessment   Upper Extremity Assessment Upper Extremity Assessment: Defer to OT evaluation    Lower Extremity Assessment Lower Extremity Assessment: LLE deficits/detail;Generalized weakness LLE Deficits / Details: intermittent tingling/burning in L heel when pain increases. LLE: Unable to fully assess due to immobilization    Cervical / Trunk Assessment Cervical / Trunk Assessment: Kyphotic (mild)  Communication   Communication Communication: No apparent difficulties    Cognition Arousal: Alert Behavior During Therapy: WFL for tasks assessed/performed   PT - Cognitive impairments: No apparent impairments                         Following commands: Intact       Cueing Cueing Techniques: Verbal cues     General Comments General comments  (skin integrity, edema, etc.): pt fatigued with minimal ambulation - discussed that pt will be WC level until cleared to place weight through LLE.    Exercises     Assessment/Plan    PT Assessment Patient needs continued PT services  PT Problem List Decreased strength;Decreased activity tolerance;Decreased balance;Decreased mobility;Decreased coordination;Cardiopulmonary status limiting activity;Pain       PT Treatment Interventions DME instruction;Gait training;Stair training;Functional mobility training;Therapeutic activities;Therapeutic exercise;Balance training;Neuromuscular re-education;Patient/family education    PT Goals (Current goals can be found in the Care Plan section)  Acute Rehab PT Goals Patient Stated Goal: to go home PT Goal Formulation: With patient/family Time For Goal Achievement: 06/04/24 Potential to Achieve Goals: Good    Frequency Min 2X/week     Co-evaluation               AM-PAC PT 6 Clicks Mobility  Outcome Measure Help needed turning from your back to your side while in a flat bed without using bedrails?: None Help needed moving from lying on your back to sitting on the side of a flat bed without using bedrails?: None Help needed moving to and from a bed to a chair (including a wheelchair)?: A Little Help needed standing up from a chair using your arms (e.g., wheelchair or bedside  chair)?: A Little Help needed to walk in hospital room?: A Little Help needed climbing 3-5 steps with a railing? : Total 6 Click Score: 18    End of Session Equipment Utilized During Treatment: Gait belt Activity Tolerance: Patient tolerated treatment well;Patient limited by fatigue Patient left: in bed;with call bell/phone within reach;with bed alarm set;with family/visitor present Nurse Communication: Mobility status PT Visit Diagnosis: Unsteadiness on feet (R26.81);Other abnormalities of gait and mobility (R26.89);Muscle weakness (generalized)  (M62.81);Pain Pain - Right/Left: Left Pain - part of body: Ankle and joints of foot    Time: 0737-0800 PT Time Calculation (min) (ACUTE ONLY): 23 min   Charges:   PT Evaluation $PT Eval Moderate Complexity: 1 Mod PT Treatments $Therapeutic Activity: 8-22 mins PT General Charges $$ ACUTE PT VISIT: 1 Visit         Therisa Stains PT, DPT Therisa HERO Zaunegger 05/21/2024, 8:55 AM

## 2024-05-21 NOTE — Telephone Encounter (Signed)
 Spoke with patient and spouse regarding medication. Patient spouse is stating went to pharmacy to pick up medication and was told by pharmacist Methocarbamol  (Robaxin ) tablet 1,000 mg order has not come in from provider.

## 2024-05-21 NOTE — Care Management Obs Status (Cosign Needed)
 MEDICARE OBSERVATION STATUS NOTIFICATION   Patient Details  Name: MARQUAL MI MRN: [id number] Date of Birth: 29-Oct-1946   Medicare Observation Status Notification Given:  Yes    [name], RN 05/21/2024, 9:46 AM

## 2024-05-21 NOTE — Assessment & Plan Note (Signed)
 05-21-2024 pain controlled. Wants to stay on hydrocodone . Has enough supply at home. Continue with robaxin  at home. F/u with podiatry. Non-weight bearing on left LE. Has all the DME equipment at home. Verified this with pt and wife. Has RW, WC, BSC at home. Stable for DC.

## 2024-05-21 NOTE — Progress Notes (Signed)
  Subjective:  Patient ID: [name], male    DOB: 05-Mar-1946,  MRN: [id number]   DOS: 05/20/24 Procedure: AMPUTATION, FOOT, TRANSMETATARSAL (Left)   78 y.o. male seen for post op check. He reports he is doing well though does have some burning pain in his left heel. Aware of nonweightbearing to left foot. Aware of plans for PT today and possibly home. Discussed he can go back on his home hydrocodone  for pain when gets home should be effective for foot also.   Review of Systems: Negative except as noted in the HPI. Denies N/V/F/Ch.   Objective:   Constitutional Well developed. Well nourished.  Vascular Foot warm and well perfused. Capillary refill normal to all digits.   No calf pain with palpation  Neurologic Normal speech. Oriented to person, place, and time. Epicritic sensation diminished  Dermatologic Dressing C/D/I to left foot CAM boot on  Orthopedic: S/p L foot TMA   Radiographs: Interval amputation at the level of the proximal first through fifth metatarsals.   Pathology: pending  Micro: None  Assessment:   Ulcer and chronic osteomyelitis of left foot s/p TMA  Plan:  Patient was evaluated and treated and all questions answered.  POD # 1 s/p L foot TMA -Progressing as expected post op, plan for PT this AM. -XR: expected post op changes -WB Status: NWB in CAM boot to LLE -Sutures: remain intact. -Medications/ABX: recommend 5 days PO abx doxycyline on discharge -Dressing: Remain C/D/I - F/u Plan: pt stable for DC later today if cleared by PT for home and has equipment needed. Will follow up 7/21 with Dr. Gershon.         Marolyn JULIANNA Honour, DPM [facility]

## 2024-05-21 NOTE — Plan of Care (Signed)

## 2024-05-21 NOTE — Plan of Care (Signed)
  Problem: Education: Goal: Knowledge of General Education information will improve Description: Including pain rating scale, medication(s)/side effects and non-pharmacologic comfort measures Outcome: Adequate for Discharge   Problem: Health Behavior/Discharge Planning: Goal: Ability to manage health-related needs will improve Outcome: Adequate for Discharge   Problem: Clinical Measurements: Goal: Ability to maintain clinical measurements within normal limits will improve Outcome: Adequate for Discharge Goal: Will remain free from infection Outcome: Adequate for Discharge Goal: Diagnostic test results will improve Outcome: Adequate for Discharge Goal: Respiratory complications will improve Outcome: Adequate for Discharge Goal: Cardiovascular complication will be avoided Outcome: Adequate for Discharge   Problem: Activity: Goal: Risk for activity intolerance will decrease Outcome: Adequate for Discharge   Problem: Nutrition: Goal: Adequate nutrition will be maintained Outcome: Adequate for Discharge   Problem: Coping: Goal: Level of anxiety will decrease Outcome: Adequate for Discharge   Problem: Elimination: Goal: Will not experience complications related to bowel motility Outcome: Adequate for Discharge Goal: Will not experience complications related to urinary retention Outcome: Adequate for Discharge   Problem: Pain Managment: Goal: General experience of comfort will improve and/or be controlled Outcome: Adequate for Discharge   Problem: Safety: Goal: Ability to remain free from injury will improve Outcome: Adequate for Discharge   Problem: Skin Integrity: Goal: Risk for impaired skin integrity will decrease Outcome: Adequate for Discharge   Problem: Acute Rehab PT Goals(only PT should resolve) Goal: Patient Will Transfer Sit To/From Stand Outcome: Adequate for Discharge Goal: Pt Will Transfer Bed To Chair/Chair To Bed Outcome: Adequate for Discharge Goal:  Pt Will Ambulate Outcome: Adequate for Discharge

## 2024-05-21 NOTE — Assessment & Plan Note (Signed)
 05-21-2024 stable. Continue insulin , amaryl , actos  at home.

## 2024-05-21 NOTE — Assessment & Plan Note (Signed)
 05-21-2024 stable.

## 2024-05-21 NOTE — Assessment & Plan Note (Signed)
 05-21-2024 stable. Prn O2 at home

## 2024-05-21 NOTE — Progress Notes (Signed)
 Transition of Care [facility]) - Inpatient Brief Assessment   Patient Details  Name: [name] MRN: [id number] Date of Birth: 1946-08-11  Transition of Care [facility]) CM/SW Contact:    [name], RN Phone Number: 05/21/2024, 9:51 AM   Clinical Narrative: Patient is S/P Left TMA with CAM walker in place.  Patient plans to discharge home today with his wife by car.  DME at the home include home oxygen  through [employer] and states that he is fine to transport home without his portable oxygen  tank and declines to order portable tank for the ride home, RW and WC at home as well.  Patient was offered Medicare choice regarding home health and he states he does not want [facility] back to the home and does not have a preference.  I called Shalese, RNCM with [facility] [facility] and they accepted for [facility] PT.  HH orders are in place.  [place] letter given.  No other TOC needs and bedside nursing to provide discharge instructions.   Transition of Care Asessment: Insurance and Status: (P) Insurance coverage has been reviewed Patient has primary care physician: (P) Yes Home environment has been reviewed: (P) from home with spouse Prior level of function:: (P) RW, self Prior/Current Home Services: (P) No current home services Social Drivers of Health Review: (P) SDOH reviewed interventions complete Readmission risk has been reviewed: (P) Yes Transition of care needs: (P) transition of care needs identified, TOC will continue to follow

## 2024-05-21 NOTE — Discharge Summary (Signed)
 [facility] Phys[facility] Discharge Summary   Patient name: [name]  Admit date:     05/20/2024  Discharge date: 05/21/2024  Attending Physician: GEORGINA BASKET [[id number]]  Discharge Physician: Camellia Door   PCP: Domenica Harlene DELENA, MD  Admitted From: Home  Disposition:  Home  Recommendations for Outpatient Follow-up:  Follow up with PCP in 1-2 weeks Follow up with podiatry on 05-25-2024  Home Health:Yes. PT Equipment/Devices: Oxygen  4 L/min. He has home O2 concentrator already.  Discharge Condition:Stable CODE STATUS:FULL Diet recommendation: Heart Healthy/Diabetic Fluid Restriction: None  Hospital Summary: HPI: [name] is a 78 y.o. male with medical history significant of CAD, HTN, HLD, hypothyroidism, COPD on 4 L of oxygen  at baseline, PAD s/p left iliac stents, osteomyelitis s/p left hallux amputation in 05/2019, and AAA who is POD0 s/p L TMA w/ Podiatry on 7/16.   Pt is stable in PACU with pain well controlled and on RA. Pt admitted per Podiatry request for pain control and PT/OT eval.  Significant Events: Admitted 05/20/2024 after left Transmetatarsal amputation   Significant Labs:   Significant Imaging Studies:   Antibiotic Therapy: Anti-infectives (From admission, onward)    Start     Dose/Rate Route Frequency Ordered Stop   05/20/24 0915  ceFAZolin  (ANCEF ) IVPB 2g/100 mL premix        2 g 200 mL/hr over 30 Minutes Intravenous On call to O.R. 05/20/24 0914 05/20/24 1119       Procedures: 05-20-2024 left transmetatarsal amputation  Consultants: podiatry   Hospital Course by Problem: * S/P transmetatarsal amputation of foot, left (HCC) 05-21-2024 pain controlled. Wants to stay on hydrocodone . Has enough supply at home. Continue with robaxin  at home. F/u with podiatry. Non-weight bearing on left LE. Has all the DME equipment at home. Verified this with pt and wife. Has RW, WC, BSC at home. Stable for DC.  Chronic respiratory failure with  hypoxia (HCC) - on prn 4 L/min O2 05-21-2024 stable. Prn O2 at home  Obesity, Class II, BMI 35-39.9 05-21-2024 Body mass index is 36.92 kg/m.   COPD (chronic obstructive pulmonary disease) 05-21-2024 stable.  Essential hypertension 05-21-2024 stable. Continue home meds  Diabetes mellitus type 2 with neurological manifestations 05-21-2024 stable. Continue insulin , amaryl , actos  at home.    Discharge Diagnoses:  Principal Problem:   S/P transmetatarsal amputation of foot, left (HCC) Active Problems:   Essential hypertension   COPD (chronic obstructive pulmonary disease)   Obesity, Class II, BMI 35-39.9   Chronic respiratory failure with hypoxia (HCC) - on prn 4 L/min O2   Diabetes mellitus type 2 with neurological manifestations   Discharge Instructions  Discharge Instructions     Call MD for:  difficulty breathing, headache or visual disturbances   Complete by: As directed    Call MD for:  extreme fatigue   Complete by: As directed    Call MD for:  hives   Complete by: As directed    Call MD for:  persistant dizziness or light-headedness   Complete by: As directed    Call MD for:  persistant nausea and vomiting   Complete by: As directed    Call MD for:  redness, tenderness, or signs of infection (pain, swelling, redness, odor or green/yellow discharge around incision site)   Complete by: As directed    Call MD for:  severe uncontrolled pain   Complete by: As directed    Call MD for:  temperature >100.4   Complete by: As directed  Diet - low sodium heart healthy   Complete by: As directed    Discharge instructions   Complete by: As directed    1. Follow up with your primary care provider in 1-2 weeks following discharge from hospital. 2. Follow up with podiatry as scheduled. Call office this week to verify appointment.   Increase activity slowly   Complete by: As directed    Leave dressing on - Keep it clean, dry, and intact until clinic visit   Complete by:  As directed    Non weight bearing   Complete by: As directed    Laterality: left   Extremity: Lower      Allergies as of 05/21/2024       Reactions   Metformin  Diarrhea   Prednisone  Itching        Medication List     TAKE these medications    Accu-Chek Aviva Plus w/Device Kit Check blood sugars three times daily   Accu-Chek Guide Test test strip Generic drug: glucose blood Check blood sugars 3 times daily   accu-chek multiclix lancets Check blood sugars three times daily   albuterol  (2.5 MG/3ML) 0.083% nebulizer solution Commonly known as: PROVENTIL  Take 3 mLs (2.5 mg total) by nebulization every 6 (six) hours as needed for wheezing or shortness of breath.   albuterol  108 (90 Base) MCG/ACT inhaler Commonly known as: VENTOLIN  HFA Inhale 2 puffs into the lungs every 6 (six) hours as needed.   ALPRAZolam  0.5 MG tablet Commonly known as: XANAX  Take 1 tablet (0.5 mg total) by mouth 2 (two) times daily as needed for anxiety.   arformoterol  15 MCG/2ML Nebu Commonly known as: BROVANA  Substituted for: Brovana  Neb Solution Inhale one vial via nebulizer once daily.   aspirin  EC 81 MG tablet Take 81 mg by mouth at bedtime.   atorvastatin  80 MG tablet Commonly known as: LIPITOR  Take 1 tablet (80 mg total) by mouth daily.   BD Pen Needle Nano 2nd Gen 32G X 4 MM Misc Generic drug: Insulin  Pen Needle USE AS DIRECTED WITH HUMALOG  PEN   Biotin 1000 MCG tablet Take 1,000 mg by mouth daily.   budesonide  0.25 MG/2ML nebulizer solution Commonly known as: Pulmicort  Take 2 mLs (0.25 mg total) by nebulization daily.   carvedilol  25 MG tablet Commonly known as: COREG  TAKE ONE-HALF TABLET BY  MOUTH TWICE DAILY WITH  MEALS   cholecalciferol  1000 units tablet Commonly known as: VITAMIN D  Take 1,000 Units by mouth daily.   clopidogrel  75 MG tablet Commonly known as: PLAVIX  Take 1 tablet (75 mg total) by mouth daily.   cyanocobalamin 1000 MCG tablet Commonly known as:  VITAMIN B12 Take 1,000 mcg by mouth See admin instructions. Take one tablet by mouth twice weekly on Monday and Thursday per wife   dorzolamide -timolol  2-0.5 % ophthalmic solution Commonly known as: COSOPT  Place 1 drop into both eyes daily.   doxycycline  100 MG capsule Commonly known as: VIBRAMYCIN  Take 1 capsule (100 mg total) by mouth 2 (two) times daily for 5 days.   ezetimibe  10 MG tablet Commonly known as: ZETIA  TAKE 1 TABLET BY MOUTH EVERY DAY   Fish Oil 1000 MG Caps Take 1,000 mg by mouth 2 (two) times daily.   FLUoxetine  40 MG capsule Commonly known as: PROZAC  Take 1 capsule (40 mg total) by mouth daily.   glimepiride  2 MG tablet Commonly known as: AMARYL  Take 1 tablet (2 mg total) by mouth 2 (two) times daily.   HYDROcodone -acetaminophen  7.5-325 MG tablet Commonly  known as: NORCO Take 1-2 tablets by mouth every 12 (twelve) hours as needed for severe pain (pain score 7-10) or moderate pain (pain score 4-6).   insulin  lispro 100 UNIT/ML KwikPen Commonly known as: HumaLOG  KwikPen Before each meal 3 times a day, 140-199 - 3 units, 200-250 - 6 units, 251-299 - 8 units,  300-349 - 10 units,  350 or above 12 units. Insulin  PEN if approved, provide syringes and needles if needed.   Latanoprost  0.005 % Emul Place 1 drop into both eyes in the morning and at bedtime.   levothyroxine  25 MCG tablet Commonly known as: SYNTHROID  Take 1 tablet (25 mcg total) by mouth daily before breakfast.   losartan  50 MG tablet Commonly known as: COZAAR  Take 1 tablet (50 mg total) by mouth daily.   Magnesium  500 MG Tabs Take 500 mg by mouth daily.   memantine  5 MG tablet Commonly known as: NAMENDA  Take 1 tablet (5 mg total) by mouth 2 (two) times daily.   Methocarbamol  1000 MG Tabs Take 1,000 mg by mouth 3 (three) times daily for 3 days, THEN 1,000 mg every 8 (eight) hours as needed for up to 3 days for muscle spasms. Start taking on: May 21, 2024   multivitamin tablet Take 1  tablet by mouth daily.   pioglitazone  30 MG tablet Commonly known as: ACTOS  Take 1 tablet (30 mg total) by mouth daily.   prednisoLONE  acetate 1 % ophthalmic suspension Commonly known as: PRED FORTE  Place 1 drop into the right eye 2 (two) times daily.   pregabalin  150 MG capsule Commonly known as: LYRICA  TAKE 1 CAPSULE BY MOUTH TWICE A DAY   vitamin C  1000 MG tablet Take 1,000 mg by mouth daily.   zinc  gluconate 50 MG tablet Take 50 mg by mouth daily.               Discharge Care Instructions  (From admission, onward)           Start     Ordered   05/21/24 0000  Leave dressing on - Keep it clean, dry, and intact until clinic visit        05/21/24 0815   05/21/24 0000  Non weight bearing       Question Answer Comment  Laterality left   Extremity Lower      05/21/24 0815            Follow-up Information     Gershon Donnice SAUNDERS, DPM. Schedule an appointment as soon as possible for a visit in 1 week(s).   Specialties: Podiatry, Surgery Contact information: [address] [place] [address] [place] [address] [phone]                Allergies  Allergen Reactions   Metformin  Diarrhea   Prednisone  Itching    Discharge Exam: Vitals:   05/21/24 0441 05/21/24 0447  BP: 134/65   Pulse: 67   Resp: 17   Temp: 97.9 F (36.6 C)   SpO2: 93% 97%    Physical Exam Vitals and nursing note reviewed.  Constitutional:      General: He is not in acute distress.    Appearance: He is obese. He is not toxic-appearing.  HENT:     Head: Normocephalic and atraumatic.  Eyes:     General: No scleral icterus. Cardiovascular:     Rate and Rhythm: Normal rate and regular rhythm.  Pulmonary:     Effort: Pulmonary effort is normal.  Breath sounds: Normal breath sounds.  Abdominal:     General: Bowel sounds are normal. There is no distension.     Palpations: Abdomen is soft.  Musculoskeletal:     Comments: Left foot in CAM boot and wrapped in bulky  dressing  Skin:    Capillary Refill: Capillary refill takes less than 2 seconds.  Neurological:     General: No focal deficit present.     Mental Status: He is alert and oriented to person, place, and time.     The results of significant diagnostics from this hospitalization (including imaging, microbiology, ancillary and laboratory) are listed below for reference.    Microbiology: Recent Results (from the past 240 hours)  Urine Culture     Status: None   Collection Time: 05/15/24 10:12 AM   Specimen: Urine  Result Value Ref Range Status   MICRO NUMBER: [id number]  Final   SPECIMEN QUALITY: Adequate  Final   Sample Source NOT GIVEN  Final   STATUS: FINAL  Final   Result: No Growth  Final     Labs: Basic Metabolic Panel: Recent Labs  Lab 05/15/24 1011 05/21/24 0229  NA 140 138  K 4.8 4.2  CL 101 101  CO2 36* 28  GLUCOSE 141* 188*  BUN 22 23  CREATININE 1.00 0.94  CALCIUM  9.1 8.5*   Liver Function Tests: Recent Labs  Lab 05/15/24 1011  AST 15  ALT 16  ALKPHOS 93  BILITOT 0.5  PROT 6.3  ALBUMIN 3.9   CBC: Recent Labs  Lab 05/15/24 1011 05/21/24 0229  WBC 7.1 9.6  NEUTROABS 4.4  --   HGB 13.4 12.1*  HCT 40.3 37.4*  MCV 95.7 98.2  PLT 170.0 193   CBG: Recent Labs  Lab 05/20/24 0925 05/20/24 1104 05/20/24 1222  GLUCAP 154* 136* 133*   Urinalysis    Component Value Date/Time   COLORURINE YELLOW 09/15/2023 1350   APPEARANCEUR CLEAR 09/15/2023 1350   LABSPEC 1.019 09/15/2023 1350   PHURINE 5.0 09/15/2023 1350   GLUCOSEU NEGATIVE 09/15/2023 1350   HGBUR NEGATIVE 09/15/2023 1350   BILIRUBINUR neative 05/15/2024 0933   KETONESUR NEGATIVE 09/15/2023 1350   PROTEINUR Negative 05/15/2024 0933   PROTEINUR NEGATIVE 09/15/2023 1350   UROBILINOGEN 0.2 05/15/2024 0933   NITRITE negative 05/15/2024 0933   NITRITE NEGATIVE 09/15/2023 1350   LEUKOCYTESUR Negative 05/15/2024 0933   LEUKOCYTESUR NEGATIVE 09/15/2023 1350   Sepsis Labs Recent Labs  Lab  05/15/24 1011 05/21/24 0229  WBC 7.1 9.6    Procedures/Studies: DG Foot 2 Views Left Result Date: 05/20/2024 CLINICAL DATA:  Postoperative EXAM: LEFT FOOT - 2 VIEW COMPARISON:  Left foot x-ray 04/28/2024 FINDINGS: There has been interval amputation at the level of the proximal first through fifth metatarsals. There are overlying skin staples. There is no acute fracture or dislocation. The bones are diffusely osteopenic. Degenerative changes of the talonavicular joint are again seen. Orthopedic hardware seen in the distal tibia and distal fibula. IMPRESSION: Interval amputation at the level of the proximal first through fifth metatarsals. Electronically Signed   By: Greig Pique M.D.   On: 05/20/2024 16:44   DG Foot Complete Left Result Date: 04/28/2024 Please see detailed radiograph report in office note.   Time coordinating discharge: 55 mins  SIGNED:  Camellia Door, DO [facility] 05/21/24, 8:24 AM

## 2024-05-21 NOTE — Assessment & Plan Note (Signed)
 05-21-2024 stable. Continue home meds

## 2024-05-21 NOTE — Assessment & Plan Note (Signed)
 05-21-2024 Body mass index is 36.92 kg/m.

## 2024-05-22 ENCOUNTER — Telehealth: Payer: Self-pay

## 2024-05-22 ENCOUNTER — Ambulatory Visit: Payer: Self-pay | Admitting: Podiatry

## 2024-05-22 DIAGNOSIS — J4489 Other specified chronic obstructive pulmonary disease: Secondary | ICD-10-CM | POA: Diagnosis not present

## 2024-05-22 DIAGNOSIS — E1149 Type 2 diabetes mellitus with other diabetic neurological complication: Secondary | ICD-10-CM | POA: Diagnosis not present

## 2024-05-22 DIAGNOSIS — E1169 Type 2 diabetes mellitus with other specified complication: Secondary | ICD-10-CM | POA: Diagnosis not present

## 2024-05-22 DIAGNOSIS — Z7902 Long term (current) use of antithrombotics/antiplatelets: Secondary | ICD-10-CM | POA: Diagnosis not present

## 2024-05-22 DIAGNOSIS — I2510 Atherosclerotic heart disease of native coronary artery without angina pectoris: Secondary | ICD-10-CM | POA: Diagnosis not present

## 2024-05-22 DIAGNOSIS — I714 Abdominal aortic aneurysm, without rupture, unspecified: Secondary | ICD-10-CM | POA: Diagnosis not present

## 2024-05-22 DIAGNOSIS — I10 Essential (primary) hypertension: Secondary | ICD-10-CM | POA: Diagnosis not present

## 2024-05-22 DIAGNOSIS — E1151 Type 2 diabetes mellitus with diabetic peripheral angiopathy without gangrene: Secondary | ICD-10-CM | POA: Diagnosis not present

## 2024-05-22 DIAGNOSIS — Z89412 Acquired absence of left great toe: Secondary | ICD-10-CM | POA: Diagnosis not present

## 2024-05-22 DIAGNOSIS — Z7952 Long term (current) use of systemic steroids: Secondary | ICD-10-CM | POA: Diagnosis not present

## 2024-05-22 DIAGNOSIS — Z7984 Long term (current) use of oral hypoglycemic drugs: Secondary | ICD-10-CM | POA: Diagnosis not present

## 2024-05-22 DIAGNOSIS — Z89432 Acquired absence of left foot: Secondary | ICD-10-CM | POA: Diagnosis not present

## 2024-05-22 DIAGNOSIS — I671 Cerebral aneurysm, nonruptured: Secondary | ICD-10-CM | POA: Diagnosis not present

## 2024-05-22 DIAGNOSIS — I771 Stricture of artery: Secondary | ICD-10-CM | POA: Diagnosis not present

## 2024-05-22 DIAGNOSIS — E785 Hyperlipidemia, unspecified: Secondary | ICD-10-CM | POA: Diagnosis not present

## 2024-05-22 DIAGNOSIS — J9611 Chronic respiratory failure with hypoxia: Secondary | ICD-10-CM | POA: Diagnosis not present

## 2024-05-22 DIAGNOSIS — Z95820 Peripheral vascular angioplasty status with implants and grafts: Secondary | ICD-10-CM | POA: Diagnosis not present

## 2024-05-22 DIAGNOSIS — M109 Gout, unspecified: Secondary | ICD-10-CM | POA: Diagnosis not present

## 2024-05-22 DIAGNOSIS — D649 Anemia, unspecified: Secondary | ICD-10-CM | POA: Diagnosis not present

## 2024-05-22 DIAGNOSIS — E039 Hypothyroidism, unspecified: Secondary | ICD-10-CM | POA: Diagnosis not present

## 2024-05-22 LAB — SURGICAL PATHOLOGY

## 2024-05-22 NOTE — Telephone Encounter (Signed)
 Spoke w/ Arlys John- verbal orders given.

## 2024-05-22 NOTE — Telephone Encounter (Signed)
 Copied from CRM [id number]. Topic: Clinical - Home Health Verbal Orders >> May 22, 2024 10:09 AM Chasity T wrote: Caller/Agency: [facility] Callback Number: [phone] (confidential voicemail) Service Requested: Physical Therapy Frequency: 1 week 9 Any new concerns about the patient? No

## 2024-05-25 ENCOUNTER — Ambulatory Visit (INDEPENDENT_AMBULATORY_CARE_PROVIDER_SITE_OTHER): Admitting: Podiatry

## 2024-05-25 ENCOUNTER — Encounter: Payer: Self-pay | Admitting: Podiatry

## 2024-05-25 DIAGNOSIS — Z89432 Acquired absence of left foot: Secondary | ICD-10-CM

## 2024-05-25 DIAGNOSIS — R2681 Unsteadiness on feet: Secondary | ICD-10-CM

## 2024-05-25 MED ORDER — HYDROCODONE-ACETAMINOPHEN 7.5-325 MG PO TABS: 1.0000 | ORAL_TABLET | Freq: Two times a day (BID) | ORAL | 0 refills | Status: AC | PRN

## 2024-05-25 MED ORDER — DOXYCYCLINE HYCLATE 100 MG PO CAPS
100.0000 mg | ORAL_CAPSULE | Freq: Two times a day (BID) | ORAL | 0 refills | Status: AC
Start: 2024-05-25 — End: 2024-05-30

## 2024-05-26 ENCOUNTER — Telehealth: Payer: Self-pay | Admitting: Podiatry

## 2024-05-26 DIAGNOSIS — I671 Cerebral aneurysm, nonruptured: Secondary | ICD-10-CM | POA: Diagnosis not present

## 2024-05-26 DIAGNOSIS — Z7902 Long term (current) use of antithrombotics/antiplatelets: Secondary | ICD-10-CM | POA: Diagnosis not present

## 2024-05-26 DIAGNOSIS — J9611 Chronic respiratory failure with hypoxia: Secondary | ICD-10-CM | POA: Diagnosis not present

## 2024-05-26 DIAGNOSIS — E039 Hypothyroidism, unspecified: Secondary | ICD-10-CM | POA: Diagnosis not present

## 2024-05-26 DIAGNOSIS — Z7952 Long term (current) use of systemic steroids: Secondary | ICD-10-CM | POA: Diagnosis not present

## 2024-05-26 DIAGNOSIS — Z89412 Acquired absence of left great toe: Secondary | ICD-10-CM | POA: Diagnosis not present

## 2024-05-26 DIAGNOSIS — I2510 Atherosclerotic heart disease of native coronary artery without angina pectoris: Secondary | ICD-10-CM | POA: Diagnosis not present

## 2024-05-26 DIAGNOSIS — I714 Abdominal aortic aneurysm, without rupture, unspecified: Secondary | ICD-10-CM | POA: Diagnosis not present

## 2024-05-26 DIAGNOSIS — D649 Anemia, unspecified: Secondary | ICD-10-CM | POA: Diagnosis not present

## 2024-05-26 DIAGNOSIS — Z95820 Peripheral vascular angioplasty status with implants and grafts: Secondary | ICD-10-CM | POA: Diagnosis not present

## 2024-05-26 DIAGNOSIS — I10 Essential (primary) hypertension: Secondary | ICD-10-CM | POA: Diagnosis not present

## 2024-05-26 DIAGNOSIS — E1149 Type 2 diabetes mellitus with other diabetic neurological complication: Secondary | ICD-10-CM | POA: Diagnosis not present

## 2024-05-26 DIAGNOSIS — E1169 Type 2 diabetes mellitus with other specified complication: Secondary | ICD-10-CM | POA: Diagnosis not present

## 2024-05-26 DIAGNOSIS — E785 Hyperlipidemia, unspecified: Secondary | ICD-10-CM | POA: Diagnosis not present

## 2024-05-26 DIAGNOSIS — J4489 Other specified chronic obstructive pulmonary disease: Secondary | ICD-10-CM | POA: Diagnosis not present

## 2024-05-26 DIAGNOSIS — Z7984 Long term (current) use of oral hypoglycemic drugs: Secondary | ICD-10-CM | POA: Diagnosis not present

## 2024-05-26 DIAGNOSIS — M109 Gout, unspecified: Secondary | ICD-10-CM | POA: Diagnosis not present

## 2024-05-26 DIAGNOSIS — E1151 Type 2 diabetes mellitus with diabetic peripheral angiopathy without gangrene: Secondary | ICD-10-CM | POA: Diagnosis not present

## 2024-05-26 DIAGNOSIS — I771 Stricture of artery: Secondary | ICD-10-CM | POA: Diagnosis not present

## 2024-05-26 DIAGNOSIS — Z89432 Acquired absence of left foot: Secondary | ICD-10-CM | POA: Diagnosis not present

## 2024-05-26 NOTE — Telephone Encounter (Signed)
 Called pts wife about the electric wheelchair.   I explained that the company we use for dme does not carry electric wheelchairs and that we recommend she call around and find a place and let me know and I can fax the order over. I also recommended she could possibly contact the insurance company and they may be able to advise her.

## 2024-05-27 NOTE — Progress Notes (Signed)
 Subjective: Chief Complaint  Patient presents with   Routine Post Op    POV # 1 DOS 05/20/24 LT TRANSMET AMPUTATION. 0 pain. IDDM A27C 78.61   78 year old male presents the office today for follow-up evaluation status post left transmetatarsal potation.  He has been doing well and stay nonweightbearing.  His wife is asking for an electric wheelchair as it is difficult to get him around the house.  His pain is currently controlled.  He finishes antibiotics today.  No fevers or chills.  No other concerns.  Objective: AAO x3, NAD DP/PT pulses palpable bilaterally, CRT less than 3 seconds Status post left foot transmetatarsal amputation with sutures, staples intact without any evidence of dehiscence.  There is rim of erythema but there is no ascending cellulitis.  There is mild edema present.  There is no increase in warmth to the foot.  There is no fluctuation or crepitation.  There is no drainage. No pain with calf compression, swelling, warmth, erythema  Assessment: Status post left TMA  Plan: -All treatment options discussed with the patient including all alternatives, risks, complications.  -Incision appears to be healing well.  Xeroform applied followed by dressing.  Thank you dressing clean, dry, intact.  Will continue antibiotics.  Refill doxycycline .  Continue nonweightbearing.  Elevation. -Electric wheelchair ordered. -Monitor for any clinical signs or symptoms of infection and directed to call the office immediately should any occur or go to the ER. -Patient encouraged to call the office with any questions, concerns, change in symptoms.   No follow-ups on file.  [name] DPM

## 2024-05-27 NOTE — Op Note (Signed)
 PATIENT:  [name]  78 y.o. male   PRE-OPERATIVE DIAGNOSIS:  M86.672   POST-OPERATIVE DIAGNOSIS:  M86.672   PROCEDURE:  Procedure(s): AMPUTATION, FOOT, TRANSMETATARSAL (Left)   SURGEON:  Surgeons and Role:    DEWAINE Gershon Donnice JONELLE, DPM - Primary   PHYSICIAN ASSISTANT:    ASSISTANTS: none    ANESTHESIA:   MAC   EBL:  100 mL    BLOOD ADMINISTERED:none   DRAINS: none    LOCAL MEDICATIONS USED:  MARCAINE    , BUPIVICAINE , and Amount: 20 ml   SPECIMEN:  Left foot TMA   DISPOSITION OF SPECIMEN:  PATHOLOGY   COUNTS:  YES   TOURNIQUET:  * Missing tourniquet times found for documented tourniquets in log: [id number] *   DICTATION: .Nechama Dictation   PLAN OF CARE: Admit to inpatient    PATIENT DISPOSITION:  PACU - hemodynamically stable.   Delay start of Pharmacological VTE agent (>24hrs) due to surgical blood loss or risk of bleeding: no   Intraoperative findings: S/p TMA left foot. Had good bleeding. No purulence and no signs of proximal infection. Hemostasis achieved. Wound closed without tension.   Postop course: To admit for at least overnight observation. Will need PT consult tomorrow for NWB. Pending pain control and PT he can be discharged from our standpoint.           Indications for surgery: 78 year old male has a longstanding wound on the left side.  His previous underwent partial 1st and 3rd ray amputations.  Has had chronic osteomyelitis of the left third toe.  He wondered when vascular intervention increased blood flow.  Given the history of previous ulcerations with chronic osteomyelitis we discussed transmetatarsal amputation.  He agrees to proceed with this.  We discussed alternatives, risks,'s.  No promises or guarantees given at the outcome of the procedure and all questions were answered the best my ability.  Procedure in detail: The patient was both verbally and visually identified by myself, nursing staff, anesthesia staff preoperatively.  He was  then transferred the operating room via stretcher.  After adequate plane of anesthesia was obtained a timeout was performed and a mixture of lidocaine , Marcaine  plain was traded and ankle block fashion.  A tourniquet was applied but of note this was not an during the procedure.  The left lower extremities and scrubbed, prepped, draped in normal sterile fashion.  Secondary timeout was performed.  The incision was made for transmetatarsal amputation.  Incision was made with a 15 blade scalpel.  Dissection was carried down to the bone using a scalpel as well as electrocautery dissection.  Soft tissue structures are free from the metatarsals.  Sagittal saw was utilized to cut the metatarsals.  A flap was made and the forefoot was disarticulated and sent to pathology.  Hemostasis was achieved.  Hemostasis again noted to be I also utilize Gelfoam mixed with thrombin  and allowed to set to make sure no further bleeding. At this time the incision was copiously irrigated with saline.  I then closed incision with 3-0 nylon as well as skin staples.  He was awoken from anesthesia and found to tolerated procedure well any complications.  Transferred to PACU vital signs stable and vascular status intact.

## 2024-05-28 ENCOUNTER — Other Ambulatory Visit: Payer: Self-pay | Admitting: Family Medicine

## 2024-05-28 ENCOUNTER — Ambulatory Visit: Payer: Medicare Other | Admitting: Family Medicine

## 2024-05-29 ENCOUNTER — Telehealth: Payer: Self-pay | Admitting: Family Medicine

## 2024-05-29 NOTE — Telephone Encounter (Signed)
 Copied from CRM [id number]. Topic: Medical Record Request - Provider/Facility Request >> May 29, 2024 11:04 AM [place] J wrote: Reason for CRM: Ronal calling from [employer]. She wanted to know if Dr. Domenica could fax back the medical record addendum that was scanned in the patient's chart on 05/15/24.   Fax: [phone]

## 2024-05-29 NOTE — Telephone Encounter (Signed)
 Another encounter was send to Dr. Domenica.

## 2024-06-03 DIAGNOSIS — Z7984 Long term (current) use of oral hypoglycemic drugs: Secondary | ICD-10-CM | POA: Diagnosis not present

## 2024-06-03 DIAGNOSIS — I714 Abdominal aortic aneurysm, without rupture, unspecified: Secondary | ICD-10-CM | POA: Diagnosis not present

## 2024-06-03 DIAGNOSIS — J9611 Chronic respiratory failure with hypoxia: Secondary | ICD-10-CM | POA: Diagnosis not present

## 2024-06-03 DIAGNOSIS — I2510 Atherosclerotic heart disease of native coronary artery without angina pectoris: Secondary | ICD-10-CM | POA: Diagnosis not present

## 2024-06-03 DIAGNOSIS — I10 Essential (primary) hypertension: Secondary | ICD-10-CM | POA: Diagnosis not present

## 2024-06-03 DIAGNOSIS — Z95820 Peripheral vascular angioplasty status with implants and grafts: Secondary | ICD-10-CM | POA: Diagnosis not present

## 2024-06-03 DIAGNOSIS — Z7902 Long term (current) use of antithrombotics/antiplatelets: Secondary | ICD-10-CM | POA: Diagnosis not present

## 2024-06-03 DIAGNOSIS — Z89412 Acquired absence of left great toe: Secondary | ICD-10-CM | POA: Diagnosis not present

## 2024-06-03 DIAGNOSIS — E1169 Type 2 diabetes mellitus with other specified complication: Secondary | ICD-10-CM | POA: Diagnosis not present

## 2024-06-03 DIAGNOSIS — Z89432 Acquired absence of left foot: Secondary | ICD-10-CM | POA: Diagnosis not present

## 2024-06-03 DIAGNOSIS — M109 Gout, unspecified: Secondary | ICD-10-CM | POA: Diagnosis not present

## 2024-06-03 DIAGNOSIS — E039 Hypothyroidism, unspecified: Secondary | ICD-10-CM | POA: Diagnosis not present

## 2024-06-03 DIAGNOSIS — I671 Cerebral aneurysm, nonruptured: Secondary | ICD-10-CM | POA: Diagnosis not present

## 2024-06-03 DIAGNOSIS — E785 Hyperlipidemia, unspecified: Secondary | ICD-10-CM | POA: Diagnosis not present

## 2024-06-03 DIAGNOSIS — J4489 Other specified chronic obstructive pulmonary disease: Secondary | ICD-10-CM | POA: Diagnosis not present

## 2024-06-03 DIAGNOSIS — I771 Stricture of artery: Secondary | ICD-10-CM | POA: Diagnosis not present

## 2024-06-03 DIAGNOSIS — D649 Anemia, unspecified: Secondary | ICD-10-CM | POA: Diagnosis not present

## 2024-06-03 DIAGNOSIS — Z7952 Long term (current) use of systemic steroids: Secondary | ICD-10-CM | POA: Diagnosis not present

## 2024-06-03 DIAGNOSIS — E1151 Type 2 diabetes mellitus with diabetic peripheral angiopathy without gangrene: Secondary | ICD-10-CM | POA: Diagnosis not present

## 2024-06-03 DIAGNOSIS — E1149 Type 2 diabetes mellitus with other diabetic neurological complication: Secondary | ICD-10-CM | POA: Diagnosis not present

## 2024-06-04 ENCOUNTER — Encounter: Payer: Self-pay | Admitting: Vascular Surgery

## 2024-06-04 ENCOUNTER — Ambulatory Visit (INDEPENDENT_AMBULATORY_CARE_PROVIDER_SITE_OTHER): Admitting: Podiatry

## 2024-06-04 DIAGNOSIS — E1151 Type 2 diabetes mellitus with diabetic peripheral angiopathy without gangrene: Secondary | ICD-10-CM | POA: Diagnosis not present

## 2024-06-04 DIAGNOSIS — J9611 Chronic respiratory failure with hypoxia: Secondary | ICD-10-CM | POA: Diagnosis not present

## 2024-06-04 DIAGNOSIS — E1149 Type 2 diabetes mellitus with other diabetic neurological complication: Secondary | ICD-10-CM | POA: Diagnosis not present

## 2024-06-04 DIAGNOSIS — Z89432 Acquired absence of left foot: Secondary | ICD-10-CM

## 2024-06-04 DIAGNOSIS — Z89412 Acquired absence of left great toe: Secondary | ICD-10-CM | POA: Diagnosis not present

## 2024-06-04 DIAGNOSIS — E785 Hyperlipidemia, unspecified: Secondary | ICD-10-CM | POA: Diagnosis not present

## 2024-06-04 DIAGNOSIS — I714 Abdominal aortic aneurysm, without rupture, unspecified: Secondary | ICD-10-CM | POA: Diagnosis not present

## 2024-06-04 DIAGNOSIS — E1169 Type 2 diabetes mellitus with other specified complication: Secondary | ICD-10-CM | POA: Diagnosis not present

## 2024-06-04 DIAGNOSIS — J4489 Other specified chronic obstructive pulmonary disease: Secondary | ICD-10-CM | POA: Diagnosis not present

## 2024-06-04 DIAGNOSIS — Z6836 Body mass index (BMI) 36.0-36.9, adult: Secondary | ICD-10-CM

## 2024-06-04 DIAGNOSIS — E66812 Obesity, class 2: Secondary | ICD-10-CM

## 2024-06-04 DIAGNOSIS — I771 Stricture of artery: Secondary | ICD-10-CM | POA: Diagnosis not present

## 2024-06-04 NOTE — Progress Notes (Signed)
 Subjective: Chief Complaint  Patient presents with   Post-op Follow-up    Staples and sutures intact. Some old blood on dressing. Patient is in tall cam boot.     78 year old male presents the office today for follow-up evaluation status post left transmetatarsal amputation.  He presents today for possible suture removal.  He has not been having any pain.  He has a dressing of the foot is much as possible.  He uses the heel for transfers only.  No fevers or chills.  Objective: AAO x3, NAD DP/PT pulses palpable bilaterally, CRT less than 3 seconds Status post left foot transmetatarsal amputation with sutures, staples intact without any evidence of dehiscence.  There is still some localized edema but this appears to be improving.  There is no surrounding erythema, ascending cellulitis but there is no drainage or pus.  Incision appears to be healing well at this time. No pain with calf compression, swelling, warmth, erythema  Assessment: Status post left TMA, healing well  Plan: -All treatment options discussed with the patient including all alternatives, risks, complications.  -Incision appears to be healing well.  Remove the sutures today.  All staples intact.  Xeroform applied followed by dressing.  Dressing reapplied.  Remain nonweightbearing.  Elevation. -Monitor for any clinical signs or symptoms of infection and directed to call the office immediately should any occur or go to the ER. -Patient encouraged to call the office with any questions, concerns, change in symptoms.   Return in about 1 week (around 06/11/2024) for suture removal .  [name] DPM

## 2024-06-05 ENCOUNTER — Encounter: Payer: Self-pay | Admitting: Podiatry

## 2024-06-05 ENCOUNTER — Other Ambulatory Visit: Payer: Self-pay | Admitting: Podiatry

## 2024-06-05 MED ORDER — DOXYCYCLINE HYCLATE 100 MG PO TABS: 100.0000 mg | ORAL_TABLET | Freq: Two times a day (BID) | ORAL | 0 refills | Status: DC

## 2024-06-05 NOTE — Telephone Encounter (Signed)
 Form that was send via fax was faxed back today, 06/05/24.

## 2024-06-08 ENCOUNTER — Telehealth: Payer: Self-pay | Admitting: Family Medicine

## 2024-06-08 NOTE — Telephone Encounter (Unsigned)
 Copied from CRM #[id number]. Topic: Clinical - Medication Prior Auth >> Jun 08, 2024  3:29 PM Gennette ORN wrote: Reason for CRM: [facility] [phone] ext [phone] is calling about the medical form and fax it back over with dr signature.

## 2024-06-09 ENCOUNTER — Ambulatory Visit (INDEPENDENT_AMBULATORY_CARE_PROVIDER_SITE_OTHER): Admitting: Podiatry

## 2024-06-09 ENCOUNTER — Encounter: Payer: Self-pay | Admitting: Podiatry

## 2024-06-09 DIAGNOSIS — Z89432 Acquired absence of left foot: Secondary | ICD-10-CM

## 2024-06-09 NOTE — Telephone Encounter (Signed)
 Called patient to see if he use the free style libre and he declined. He wanted me to let [facility] he never started using the free style.  Spoke with [facility] and advise that the patient does not want to use the freestyle libre.

## 2024-06-10 DIAGNOSIS — E1169 Type 2 diabetes mellitus with other specified complication: Secondary | ICD-10-CM | POA: Diagnosis not present

## 2024-06-10 DIAGNOSIS — D649 Anemia, unspecified: Secondary | ICD-10-CM | POA: Diagnosis not present

## 2024-06-10 DIAGNOSIS — M109 Gout, unspecified: Secondary | ICD-10-CM | POA: Diagnosis not present

## 2024-06-10 DIAGNOSIS — J4489 Other specified chronic obstructive pulmonary disease: Secondary | ICD-10-CM | POA: Diagnosis not present

## 2024-06-10 DIAGNOSIS — I671 Cerebral aneurysm, nonruptured: Secondary | ICD-10-CM | POA: Diagnosis not present

## 2024-06-10 DIAGNOSIS — Z89432 Acquired absence of left foot: Secondary | ICD-10-CM | POA: Diagnosis not present

## 2024-06-10 DIAGNOSIS — E039 Hypothyroidism, unspecified: Secondary | ICD-10-CM | POA: Diagnosis not present

## 2024-06-10 DIAGNOSIS — I714 Abdominal aortic aneurysm, without rupture, unspecified: Secondary | ICD-10-CM | POA: Diagnosis not present

## 2024-06-10 DIAGNOSIS — I2510 Atherosclerotic heart disease of native coronary artery without angina pectoris: Secondary | ICD-10-CM | POA: Diagnosis not present

## 2024-06-10 DIAGNOSIS — Z7902 Long term (current) use of antithrombotics/antiplatelets: Secondary | ICD-10-CM | POA: Diagnosis not present

## 2024-06-10 DIAGNOSIS — E1151 Type 2 diabetes mellitus with diabetic peripheral angiopathy without gangrene: Secondary | ICD-10-CM | POA: Diagnosis not present

## 2024-06-10 DIAGNOSIS — Z7952 Long term (current) use of systemic steroids: Secondary | ICD-10-CM | POA: Diagnosis not present

## 2024-06-10 DIAGNOSIS — Z89412 Acquired absence of left great toe: Secondary | ICD-10-CM | POA: Diagnosis not present

## 2024-06-10 DIAGNOSIS — Z95820 Peripheral vascular angioplasty status with implants and grafts: Secondary | ICD-10-CM | POA: Diagnosis not present

## 2024-06-10 DIAGNOSIS — I10 Essential (primary) hypertension: Secondary | ICD-10-CM | POA: Diagnosis not present

## 2024-06-10 DIAGNOSIS — J9611 Chronic respiratory failure with hypoxia: Secondary | ICD-10-CM | POA: Diagnosis not present

## 2024-06-10 DIAGNOSIS — Z7984 Long term (current) use of oral hypoglycemic drugs: Secondary | ICD-10-CM | POA: Diagnosis not present

## 2024-06-10 DIAGNOSIS — E1149 Type 2 diabetes mellitus with other diabetic neurological complication: Secondary | ICD-10-CM | POA: Diagnosis not present

## 2024-06-10 DIAGNOSIS — E785 Hyperlipidemia, unspecified: Secondary | ICD-10-CM | POA: Diagnosis not present

## 2024-06-10 DIAGNOSIS — I771 Stricture of artery: Secondary | ICD-10-CM | POA: Diagnosis not present

## 2024-06-11 ENCOUNTER — Encounter: Payer: Self-pay | Admitting: Vascular Surgery

## 2024-06-11 ENCOUNTER — Ambulatory Visit (HOSPITAL_COMMUNITY)
Admission: RE | Admit: 2024-06-11 | Discharge: 2024-06-11 | Disposition: A | Discharge status: Home / Self Care | Source: Ambulatory Visit | Attending: Vascular Surgery | Admitting: Vascular Surgery

## 2024-06-11 ENCOUNTER — Ambulatory Visit: Attending: Vascular Surgery | Admitting: Vascular Surgery

## 2024-06-11 ENCOUNTER — Ambulatory Visit (HOSPITAL_BASED_OUTPATIENT_CLINIC_OR_DEPARTMENT_OTHER)
Admission: RE | Admit: 2024-06-11 | Discharge: 2024-06-11 | Discharge status: Home / Self Care | Source: Ambulatory Visit | Attending: Vascular Surgery

## 2024-06-11 VITALS — BP 136/64 | HR 68 | Temp 98.4°F | Resp 20 | Ht 69.0 in | Wt 250.0 lb

## 2024-06-11 DIAGNOSIS — I70245 Atherosclerosis of native arteries of left leg with ulceration of other part of foot: Secondary | ICD-10-CM

## 2024-06-11 DIAGNOSIS — I739 Peripheral vascular disease, unspecified: Secondary | ICD-10-CM

## 2024-06-11 DIAGNOSIS — I7143 Infrarenal abdominal aortic aneurysm, without rupture: Secondary | ICD-10-CM | POA: Diagnosis not present

## 2024-06-11 DIAGNOSIS — Z9889 Other specified postprocedural states: Secondary | ICD-10-CM | POA: Diagnosis not present

## 2024-06-11 DIAGNOSIS — I771 Stricture of artery: Secondary | ICD-10-CM | POA: Insufficient documentation

## 2024-06-11 DIAGNOSIS — Z95828 Presence of other vascular implants and grafts: Secondary | ICD-10-CM | POA: Diagnosis not present

## 2024-06-11 LAB — VAS US ABI WITH/WO TBI
Left ABI: 1.01
Right ABI: 1.15
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
## 2024-06-12 ENCOUNTER — Telehealth: Payer: Self-pay | Admitting: *Deleted

## 2024-06-12 NOTE — Telephone Encounter (Signed)
 Per Harlene need to put in wound care referral.

## 2024-06-13 ENCOUNTER — Other Ambulatory Visit: Payer: Self-pay | Admitting: Family Medicine

## 2024-06-13 NOTE — Progress Notes (Signed)
 Subjective: Chief Complaint  Patient presents with   Routine Post Op    Rm11 Post op visit left transmetatarsal amputation ans suture removal. Patient says he is doing well.     78 year old male presents the office today for follow-up evaluation status post left transmetatarsal amputation.  He presents today for possible staple removal.  He has been doing well not having any pain or taking any medication for the foot at this time.  Does note report any fevers or chills.   Objective: AAO x3, NAD DP/PT pulses palpable bilaterally, CRT less than 3 seconds Status post left foot transmetatarsal amputation wit, staples intact without any evidence of dehiscence.  There is still some localized edema but this appears to be improving still.  There is no surrounding erythema, ascending cellulitis but there is no drainage or pus.  Incision appears to be healing well at this time. No pain with calf compression, swelling, warmth, erythema  Assessment: Status post left TMA, healing well  Plan: -All treatment options discussed with the patient including all alternatives, risks, complications.  -Some of the staples today but left the majority intact.  Xeroform applied followed by dressing.  Discussed changing the dressing if needed at home otherwise he can leave the dressing intact if he does not feel comfortable changing this. -Remain nonweightbearing.  Elevation. -Monitor for any clinical signs or symptoms of infection and directed to call the office immediately should any occur or go to the ER. -Patient encouraged to call the office with any questions, concerns, change in symptoms.   Return in about 1 week (around 06/16/2024) for staple removal .  [name] DPM

## 2024-06-17 DIAGNOSIS — E119 Type 2 diabetes mellitus without complications: Secondary | ICD-10-CM | POA: Diagnosis not present

## 2024-06-18 ENCOUNTER — Ambulatory Visit (INDEPENDENT_AMBULATORY_CARE_PROVIDER_SITE_OTHER): Admitting: Podiatry

## 2024-06-18 ENCOUNTER — Encounter: Payer: Self-pay | Admitting: Podiatry

## 2024-06-18 VITALS — Ht 69.0 in | Wt 250.0 lb

## 2024-06-18 DIAGNOSIS — E1151 Type 2 diabetes mellitus with diabetic peripheral angiopathy without gangrene: Secondary | ICD-10-CM | POA: Diagnosis not present

## 2024-06-18 DIAGNOSIS — Z89432 Acquired absence of left foot: Secondary | ICD-10-CM | POA: Diagnosis not present

## 2024-06-18 DIAGNOSIS — D649 Anemia, unspecified: Secondary | ICD-10-CM | POA: Diagnosis not present

## 2024-06-18 DIAGNOSIS — J9611 Chronic respiratory failure with hypoxia: Secondary | ICD-10-CM | POA: Diagnosis not present

## 2024-06-18 DIAGNOSIS — E1149 Type 2 diabetes mellitus with other diabetic neurological complication: Secondary | ICD-10-CM | POA: Diagnosis not present

## 2024-06-18 DIAGNOSIS — Z7984 Long term (current) use of oral hypoglycemic drugs: Secondary | ICD-10-CM | POA: Diagnosis not present

## 2024-06-18 DIAGNOSIS — I771 Stricture of artery: Secondary | ICD-10-CM | POA: Diagnosis not present

## 2024-06-18 DIAGNOSIS — I10 Essential (primary) hypertension: Secondary | ICD-10-CM | POA: Diagnosis not present

## 2024-06-18 DIAGNOSIS — E1169 Type 2 diabetes mellitus with other specified complication: Secondary | ICD-10-CM | POA: Diagnosis not present

## 2024-06-18 DIAGNOSIS — E785 Hyperlipidemia, unspecified: Secondary | ICD-10-CM | POA: Diagnosis not present

## 2024-06-18 DIAGNOSIS — I671 Cerebral aneurysm, nonruptured: Secondary | ICD-10-CM | POA: Diagnosis not present

## 2024-06-18 DIAGNOSIS — I714 Abdominal aortic aneurysm, without rupture, unspecified: Secondary | ICD-10-CM | POA: Diagnosis not present

## 2024-06-18 DIAGNOSIS — M109 Gout, unspecified: Secondary | ICD-10-CM | POA: Diagnosis not present

## 2024-06-18 DIAGNOSIS — J4489 Other specified chronic obstructive pulmonary disease: Secondary | ICD-10-CM | POA: Diagnosis not present

## 2024-06-18 DIAGNOSIS — Z95820 Peripheral vascular angioplasty status with implants and grafts: Secondary | ICD-10-CM | POA: Diagnosis not present

## 2024-06-18 DIAGNOSIS — Z7952 Long term (current) use of systemic steroids: Secondary | ICD-10-CM | POA: Diagnosis not present

## 2024-06-18 DIAGNOSIS — I2510 Atherosclerotic heart disease of native coronary artery without angina pectoris: Secondary | ICD-10-CM | POA: Diagnosis not present

## 2024-06-18 DIAGNOSIS — Z89412 Acquired absence of left great toe: Secondary | ICD-10-CM | POA: Diagnosis not present

## 2024-06-18 DIAGNOSIS — E039 Hypothyroidism, unspecified: Secondary | ICD-10-CM | POA: Diagnosis not present

## 2024-06-18 DIAGNOSIS — Z7902 Long term (current) use of antithrombotics/antiplatelets: Secondary | ICD-10-CM | POA: Diagnosis not present

## 2024-06-21 ENCOUNTER — Other Ambulatory Visit: Payer: Self-pay | Admitting: Family Medicine

## 2024-06-22 NOTE — Progress Notes (Signed)
 Subjective: Chief Complaint  Patient presents with   Routine Post Op    POV # 3 DOS 05/20/24 LT TRANSMET AMPUTATION, pt is here to f/o on letf foot due to amputation he states everything is going well, has no pain.      78 year old male presents the office today for follow-up evaluation status post left transmetatarsal amputation.  Presents today for possible staple removal.  Has been doing well.  No fevers or chills.  No pain.  No other concerns.   Objective: AAO x3, NAD DP/PT pulses palpable bilaterally, CRT less than 3 seconds Status post left foot transmetatarsal amputation with staples intact without any evidence of dehiscence.  There is still some localized edema but this appears to continue to improve.  There is no cellulitis present.  No drainage or pus.  No fluctuation or crepitation and there is no malodor. No pain with calf compression, swelling, warmth, erythema  Assessment: Status post left TMA, healing well  Plan: -All treatment options discussed with the patient including all alternatives, risks, complications.  -I removed some of the staples today but left half of them intact today.  Xeroform applied followed by dressing.  His wife has been changing the bandage and then he continue with this.  Continue elevate.  Discussed starting to be partial weightbearing in the cam boot for limited distances.  Left half of the staples intact to help as she does try to put some weight on it. -Monitor for any clinical signs or symptoms of infection and directed to call the office immediately should any occur or go to the ER.  Return in about 1 week (around 06/25/2024) for staple removal .  [name] DPM

## 2024-06-24 ENCOUNTER — Encounter: Payer: Self-pay | Admitting: Internal Medicine

## 2024-06-24 ENCOUNTER — Ambulatory Visit: Admitting: Internal Medicine

## 2024-06-24 DIAGNOSIS — E1149 Type 2 diabetes mellitus with other diabetic neurological complication: Secondary | ICD-10-CM | POA: Diagnosis not present

## 2024-06-24 DIAGNOSIS — Z89432 Acquired absence of left foot: Secondary | ICD-10-CM | POA: Diagnosis not present

## 2024-06-24 DIAGNOSIS — I714 Abdominal aortic aneurysm, without rupture, unspecified: Secondary | ICD-10-CM | POA: Diagnosis not present

## 2024-06-24 DIAGNOSIS — E1169 Type 2 diabetes mellitus with other specified complication: Secondary | ICD-10-CM | POA: Diagnosis not present

## 2024-06-24 DIAGNOSIS — Z7952 Long term (current) use of systemic steroids: Secondary | ICD-10-CM | POA: Diagnosis not present

## 2024-06-24 DIAGNOSIS — I10 Essential (primary) hypertension: Secondary | ICD-10-CM | POA: Diagnosis not present

## 2024-06-24 DIAGNOSIS — J9611 Chronic respiratory failure with hypoxia: Secondary | ICD-10-CM | POA: Diagnosis not present

## 2024-06-24 DIAGNOSIS — D649 Anemia, unspecified: Secondary | ICD-10-CM | POA: Diagnosis not present

## 2024-06-24 DIAGNOSIS — I771 Stricture of artery: Secondary | ICD-10-CM | POA: Diagnosis not present

## 2024-06-24 DIAGNOSIS — M109 Gout, unspecified: Secondary | ICD-10-CM | POA: Diagnosis not present

## 2024-06-24 DIAGNOSIS — J4489 Other specified chronic obstructive pulmonary disease: Secondary | ICD-10-CM | POA: Diagnosis not present

## 2024-06-24 DIAGNOSIS — E785 Hyperlipidemia, unspecified: Secondary | ICD-10-CM | POA: Diagnosis not present

## 2024-06-24 DIAGNOSIS — Z7902 Long term (current) use of antithrombotics/antiplatelets: Secondary | ICD-10-CM | POA: Diagnosis not present

## 2024-06-24 DIAGNOSIS — Z89412 Acquired absence of left great toe: Secondary | ICD-10-CM | POA: Diagnosis not present

## 2024-06-24 DIAGNOSIS — I671 Cerebral aneurysm, nonruptured: Secondary | ICD-10-CM | POA: Diagnosis not present

## 2024-06-24 DIAGNOSIS — E039 Hypothyroidism, unspecified: Secondary | ICD-10-CM | POA: Diagnosis not present

## 2024-06-24 DIAGNOSIS — Z95820 Peripheral vascular angioplasty status with implants and grafts: Secondary | ICD-10-CM | POA: Diagnosis not present

## 2024-06-24 DIAGNOSIS — E1151 Type 2 diabetes mellitus with diabetic peripheral angiopathy without gangrene: Secondary | ICD-10-CM | POA: Diagnosis not present

## 2024-06-24 DIAGNOSIS — Z7984 Long term (current) use of oral hypoglycemic drugs: Secondary | ICD-10-CM | POA: Diagnosis not present

## 2024-06-24 DIAGNOSIS — I2510 Atherosclerotic heart disease of native coronary artery without angina pectoris: Secondary | ICD-10-CM | POA: Diagnosis not present

## 2024-06-25 ENCOUNTER — Encounter: Payer: Self-pay | Admitting: Podiatry

## 2024-06-25 ENCOUNTER — Ambulatory Visit (INDEPENDENT_AMBULATORY_CARE_PROVIDER_SITE_OTHER): Admitting: Podiatry

## 2024-06-25 DIAGNOSIS — Z89432 Acquired absence of left foot: Secondary | ICD-10-CM

## 2024-06-25 NOTE — Progress Notes (Signed)
 Subjective: Chief Complaint  Patient presents with   Diabetes   Routine Post Op    Left foot post op Transmet. Amputation. IDDM A1C 7.0. 0 pain. Removal of staples.    78 year old male presents the office today for follow-up evaluation status post left transmetatarsal amputation.  Presents today for possible staple removal.  He does not report any fevers or chills.  No pain.  He has started putting some weight on the foot using the cam boot for short distances with a walker.  He states he feels good doing this.  Objective: AAO x3, NAD DP/PT pulses palpable bilaterally, CRT less than 3 seconds Status post left foot transmetatarsal amputation with staples intact without any evidence of dehiscence.  Still some digital edema but appears to be improving.  There is no erythema or warmth there is no drainage or pus.  No pain with calf compression, swelling, warmth, erythema  Assessment: Status post left TMA, healing well  Plan: -All treatment options discussed with the patient including all alternatives, risks, complications.  -I removed remainder of the staples today.incisions healing well.  The central aspect there is a small area of separation of the skin but it is superficial and some scabbing was present which came off today.  Steri-Strips applied for reinforcement.  Xeroform applied followed by dressing.  They are to continue with dressing changes every couple of days. - He can limit weightbearing in the cam boot with the use of a walker.  Otherwise encouraged elevation. -Monitor for any clinical signs or symptoms of infection and directed to call the office immediately should any occur or go to the ER.  Return in about 1 week (around 07/02/2024) for post-op.  [name] DPM

## 2024-07-01 DIAGNOSIS — Z7984 Long term (current) use of oral hypoglycemic drugs: Secondary | ICD-10-CM | POA: Diagnosis not present

## 2024-07-02 ENCOUNTER — Ambulatory Visit (INDEPENDENT_AMBULATORY_CARE_PROVIDER_SITE_OTHER): Admitting: Podiatry

## 2024-07-02 DIAGNOSIS — Z89432 Acquired absence of left foot: Secondary | ICD-10-CM

## 2024-07-02 NOTE — Progress Notes (Signed)
 Subjective: Chief Complaint  Patient presents with   Routine Post Op    DOS 05/20/24 LT TRANSMET AMPUTATION, pt is here to f/u on left foot due to amputation he states everything is going well and has minimal pain.    78 year old male presents the office today for follow-up evaluation status post left transmetatarsal amputation.  His wife is not change the bandage and has been doing well.  No drainage.  Swelling is improved.  He has been using the cam boot still and using a walker for short distances and physical therapy is been working with him.  He states this has been going well.  He has no other concerns today.   Objective: AAO x3, NAD DP/PT pulses palpable bilaterally, CRT less than 3 seconds Sensation decreased. Status post left foot transmetatarsal amputation without any evidence of dehiscence.  On the central aspect there is still some movement across the incision centrally but this is very minimal and superficial.  There is no probing, undermining or tunneling.  No drainage or pus.  No erythema or signs of cellulitis present today.   No pain with calf compression, swelling, warmth, erythema  Assessment: Status post left TMA, healing well  Plan: -All treatment options discussed with the patient including all alternatives, risks, complications.  -Overall doing well.  Some slight motion across the central portion of the incision with Steri-Strips applied for reinforcement.  Continue with dressing changes.  Cam boot at all times to use a walker for limited weightbearing and walking.  Encouraged elevation. -Monitor for any clinical signs or symptoms of infection and directed to call the office immediately should any occur or go to the ER.  Return in about 10 days (around 07/12/2024) for post-op and also with Tricia for insert.  [name] DPM

## 2024-07-10 ENCOUNTER — Encounter: Payer: Self-pay | Admitting: Podiatry

## 2024-07-10 ENCOUNTER — Ambulatory Visit (INDEPENDENT_AMBULATORY_CARE_PROVIDER_SITE_OTHER): Admitting: Podiatry

## 2024-07-10 DIAGNOSIS — Z89432 Acquired absence of left foot: Secondary | ICD-10-CM

## 2024-07-10 MED ORDER — SILVER SULFADIAZINE 1 % EX CREA
1.0000 | TOPICAL_CREAM | Freq: Every day | CUTANEOUS | 0 refills | Status: DC
Start: 2024-07-10 — End: 2024-08-31

## 2024-07-12 NOTE — Progress Notes (Signed)
 Subjective: Chief Complaint  Patient presents with   Routine Post Op    LT TRANSMET AMPUTATION. 0 poain. Wearing pneumatic cast. IDDM A85C 51.85    78 year old male presents the office today for follow-up evaluation status post left transmetatarsal amputation.  His wife is not been changing the bandage.  Is been healing well.  No drainage.  Swelling is also improved and mild.  No fevers or chills that he reports.  No other concerns.  Objective: AAO x3, NAD DP/PT pulses palpable bilaterally, CRT less than 3 seconds Sensation decreased. Status post left foot transmetatarsal amputation without any evidence of dehiscence.  There is 1 small superficial area however along the central aspect.  There are some hypergranulation tissue present.  There is no surrounding erythema, ascending cellulitis.  No drainage or pus.  No probing, undermining or tunneling.  No malodor. No pain with calf compression, swelling, warmth, erythema  Assessment: Status post left TMA  Plan: -All treatment options discussed with the patient including all alternatives, risks, complications.  -1 small central area of granulation tissue present.  Silver  nitrate applied followed by dressing.  Continue daily dressing changes, offloading. -Continue cam boot.  Limited weightbearing and use of walker.  Discussed washing foot with soap and water, dry thoroughly apply a similar bandage. -Measured for left insert for toe filler today. -Monitor for any clinical signs or symptoms of infection and directed to call the office immediately should any occur or go to the ER.  [name] DPM

## 2024-07-17 ENCOUNTER — Other Ambulatory Visit: Payer: Self-pay | Admitting: Family

## 2024-07-17 ENCOUNTER — Telehealth: Payer: Self-pay

## 2024-07-17 NOTE — Telephone Encounter (Signed)
 Copied from CRM [id number]. Topic: Clinical - Home Health Verbal Orders >> Jul 16, 2024  5:04 PM Terri MATSU wrote: Caller/Agency: [facility] Number: [phone] Service Requested: Physical Therapy Frequency: 1 week 9 Any new concerns about the patient? No

## 2024-07-20 ENCOUNTER — Ambulatory Visit (INDEPENDENT_AMBULATORY_CARE_PROVIDER_SITE_OTHER): Admitting: Podiatry

## 2024-07-20 DIAGNOSIS — Z89432 Acquired absence of left foot: Secondary | ICD-10-CM

## 2024-07-20 NOTE — Telephone Encounter (Signed)
 LMOM for [facility] w/ verbal orders.

## 2024-07-20 NOTE — Progress Notes (Signed)
 Subjective: Chief Complaint  Patient presents with   Routine Post Op    Status post transmetatarsal amputation of foot, left Pt stated that things are progressing slowly     78 year old male presents the office today for follow-up evaluation status post left transmetatarsal amputation.  His wife has been changing the bandage.  He states been healing well.  Yesterday he was on his feet more as his close friend recently passed away but otherwise he has been doing well.  No purulence.  No swelling or redness.  No fevers or chills.  No significant pain.  He continues to work with physical therapy at home as well.  Objective: AAO x3, NAD DP/PT pulses palpable bilaterally, CRT less than 3 seconds Sensation decreased. Status post left foot transmetatarsal amputation without any evidence of dehiscence.  There is 1 small superficial area of cranialization tissue noted on the central aspect without any surrounding erythema, ascending status post no drainage or pus.  No fluctuation or crepitation.  There is no malodor.   No pain with calf compression, swelling, warmth, erythema    Assessment: Status post left TMA  Plan: -All treatment options discussed with the patient including all alternatives, risks, complications.  -1 small central area of granulation tissue present.  Silver  nitrate applied followed by dressing.  Continue daily dressing changes, offloading. -Continue cam boot.  Limited weightbearing and use of walker.  Discussed washing foot with soap and water, dry thoroughly apply a similar bandage. - Awaiting left insert for toe filler today. -Monitor for any clinical signs or symptoms of infection and directed to call the office immediately should any occur or go to the ER.  Return in about 2 weeks (around 08/03/2024).  [name] DPM

## 2024-07-20 NOTE — Progress Notes (Unsigned)
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 percentile for age-, race-, and sex-matched controls (MESA). 2. Aorta: Borderline dilated at 38 mm, level of the main PA bifurcation (non-contrast). 3. Aortic atherosclerosis.   RECOMMENDATIONS: Coronary artery calcium  (CAC) score is a strong predictor of incident coronary heart disease (CHD) and provides predictive information beyond traditional risk factors. CAC scoring is reasonable to use in the decision to withhold, postpone, or initiate statin therapy in intermediate-risk or selected borderline-risk asymptomatic adults (age 79-75 years and LDL-C >=70 to <190 mg/dL) who do not have diabetes or established atherosclerotic cardiovascular disease (ASCVD).* In intermediate-risk (10-year ASCVD risk >=7.5% to <20%) adults or selected borderline-risk (10-year ASCVD risk >=5% to <7.5%) adults in whom a CAC score is measured for the purpose of making a treatment decision the following recommendations have been made:   If CAC=0, it is reasonable to withhold statin therapy and reassess in 5 to 10 years, as long as higher risk conditions are absent (diabetes mellitus, family history of premature CHD in  first degree relatives (males <55 years; females <65 years), cigarette smoking, or LDL >=190 mg/dL).   If CAC is 1 to 99, it is reasonable to initiate statin therapy for patients >=6 years of age.   If CAC is >=100 or >=75th percentile, it is reasonable to initiate statin therapy at any age.   Cardiology referral should be considered for patients with CAC scores >=400 or >=75th percentile.   *2018 AHA/ACC/AACVPR/AAPA/ABC/ACPM/ADA/AGS/APhA/ASPC/NLA/PCNA Guideline on the Management of Blood Cholesterol: A Report of the American College of Cardiology/American Heart Association Task Force on Clinical Practice Guidelines. J Am Coll Cardiol. 2019;73(24):3168-3209.   Vinie Maxcy, MD   Electronically Signed: By: Vinie JAYSON Maxcy M.D. On: 01/27/2024 14:45  Nuclear stress test 02/04/2024   The study is normal. The study is low risk.   No ST deviation was noted.   Nuclear stress EF: 48%. The left ventricular ejection fraction is mildly decreased (45-54%). End diastolic cavity size is normal.   There is a small defect with mild intensity in the apex that is worse at rest than stress.  With no specific wall motion abnormality suspect this is apical thinning artifact.      Assessment & Plan   1.  Coronary artery disease-no chest pain today.  Denies exertional chest discomfort.  Coronary calcium  score showed total coronary calcium  of 1006.  Nuclear stress test 02/04/2024 showed normal study and low risk.  No ischemia was noted.  Echocardiogram 02/21/2024 showed an LVEF of 55-60%. Heart healthy low-sodium diet Continue current medical therapy Increase physical activity as tolerated-goal 150 minutes of moderate physical activity per week.  Hyperlipidemia-LDL 73 on 11/24 Continue atorvastatin , ezetimibe  High-fiber diet Increase physical activity as tolerated Plan for repeat fasting lipids and LFTs 11/25-follows with PCP  Palpitations-heart rate today 71.  Continues to report occasional  episodes of brief elevated heart rate with increased physical activity.  Prior cardiac event monitor showed SR/SB/ST, occasional PACs/PVCs, short runs of NSVT, and  23 runs of SVT. Continue carvedilol  Avoid triggers caffeine, chocolate, EtOH, dehydration etc.-reviewed Continue to maintain hydration  Dyspnea, COPD-breathing stable.  Weight today 258.6.  Notes intermittent compliance with inhalers. Increase physical activity as tolerated Encouraged regular nebulizer use Follows with PCP   Essential hypertension-BP today 136/64 Continue losartan , carvedilol   Abdominal aortic aneurysm-noted to be 38 mm on recent check. Follows with Dr. Gretta    Disposition: Follow-up with Dr. Court or me in 6 months.   Josefa HERO. Dynastee Brummell NP-C     07/21/2024, 9:36 AM [facility] [address] Office [phone] Fax [phone]    I spent 14 minutes examining this patient, reviewing medications, and using patient centered shared decision making involving their cardiac care.   I spent  20 minutes reviewing past medical history,  medications, and prior cardiac tests.

## 2024-07-21 ENCOUNTER — Ambulatory Visit: Attending: General Practice | Admitting: General Practice

## 2024-07-21 ENCOUNTER — Encounter: Payer: Self-pay | Admitting: General Practice

## 2024-07-21 VITALS — BP 136/64 | HR 71 | Ht 69.0 in | Wt 258.6 lb

## 2024-07-21 DIAGNOSIS — E785 Hyperlipidemia, unspecified: Secondary | ICD-10-CM

## 2024-07-21 DIAGNOSIS — I7143 Infrarenal abdominal aortic aneurysm, without rupture: Secondary | ICD-10-CM | POA: Diagnosis not present

## 2024-07-21 DIAGNOSIS — I2510 Atherosclerotic heart disease of native coronary artery without angina pectoris: Secondary | ICD-10-CM | POA: Diagnosis not present

## 2024-07-21 DIAGNOSIS — R0609 Other forms of dyspnea: Secondary | ICD-10-CM

## 2024-07-21 DIAGNOSIS — R002 Palpitations: Secondary | ICD-10-CM

## 2024-07-21 DIAGNOSIS — E1169 Type 2 diabetes mellitus with other specified complication: Secondary | ICD-10-CM

## 2024-07-21 NOTE — Patient Instructions (Signed)
 Medication Instructions:  Your physician recommends that you continue on your current medications as directed. Please refer to the Current Medication list given to you today.  *If you need a refill on your cardiac medications before your next appointment, please call your pharmacy*  Lab Work: NONE If you have labs (blood work) drawn today and your tests are completely normal, you will receive your results only by: MyChart Message (if you have MyChart) OR A paper copy in the mail If you have any lab test that is abnormal or we need to change your treatment, we will call you to review the results.  Testing/Procedures: NONE  Follow-Up: At [facility], you and your health needs are our priority.  As part of our continuing mission to provide you with exceptional heart care, our providers are all part of one team.  This team includes your primary Cardiologist (physician) and Advanced Practice Providers or APPs (Physician Assistants and Nurse Practitioners) who all work together to provide you with the care you need, when you need it.  Your next appointment:   6 month(s)  Provider:   Dorn Lesches, MD   Other Instructions Exercise regularly as told by your doctor. Make sure to weight daily and keep a weight log.   Moderate-intensity exercise is any activity that gets you moving enough to burn at least three times more energy (calories) than if you were sitting. Examples of moderate exercise include: Walking a mile in 15 minutes. Doing light yard work. Biking at an easy pace. Most people should get at least 150 minutes of moderate-intensity exercise a week to maintain their body weight.  Increase your water intake: Maintain hydration

## 2024-07-22 ENCOUNTER — Encounter: Payer: Self-pay | Admitting: Student

## 2024-07-22 ENCOUNTER — Encounter: Payer: Self-pay | Admitting: Family Medicine

## 2024-07-22 DIAGNOSIS — I714 Abdominal aortic aneurysm, without rupture, unspecified: Secondary | ICD-10-CM | POA: Diagnosis not present

## 2024-07-22 DIAGNOSIS — J9611 Chronic respiratory failure with hypoxia: Secondary | ICD-10-CM | POA: Diagnosis not present

## 2024-07-22 DIAGNOSIS — E1149 Type 2 diabetes mellitus with other diabetic neurological complication: Secondary | ICD-10-CM | POA: Diagnosis not present

## 2024-07-22 DIAGNOSIS — E1169 Type 2 diabetes mellitus with other specified complication: Secondary | ICD-10-CM | POA: Diagnosis not present

## 2024-07-22 DIAGNOSIS — J4489 Other specified chronic obstructive pulmonary disease: Secondary | ICD-10-CM | POA: Diagnosis not present

## 2024-07-22 DIAGNOSIS — I671 Cerebral aneurysm, nonruptured: Secondary | ICD-10-CM | POA: Diagnosis not present

## 2024-07-22 DIAGNOSIS — D649 Anemia, unspecified: Secondary | ICD-10-CM | POA: Diagnosis not present

## 2024-07-22 DIAGNOSIS — I2510 Atherosclerotic heart disease of native coronary artery without angina pectoris: Secondary | ICD-10-CM | POA: Diagnosis not present

## 2024-07-22 DIAGNOSIS — Z7982 Long term (current) use of aspirin: Secondary | ICD-10-CM | POA: Diagnosis not present

## 2024-07-22 DIAGNOSIS — I771 Stricture of artery: Secondary | ICD-10-CM | POA: Diagnosis not present

## 2024-07-22 DIAGNOSIS — E785 Hyperlipidemia, unspecified: Secondary | ICD-10-CM | POA: Diagnosis not present

## 2024-07-22 DIAGNOSIS — Z89432 Acquired absence of left foot: Secondary | ICD-10-CM | POA: Diagnosis not present

## 2024-07-22 DIAGNOSIS — M109 Gout, unspecified: Secondary | ICD-10-CM | POA: Diagnosis not present

## 2024-07-22 DIAGNOSIS — Z7902 Long term (current) use of antithrombotics/antiplatelets: Secondary | ICD-10-CM | POA: Diagnosis not present

## 2024-07-22 DIAGNOSIS — I10 Essential (primary) hypertension: Secondary | ICD-10-CM | POA: Diagnosis not present

## 2024-07-22 DIAGNOSIS — Z89412 Acquired absence of left great toe: Secondary | ICD-10-CM | POA: Diagnosis not present

## 2024-07-22 DIAGNOSIS — E1151 Type 2 diabetes mellitus with diabetic peripheral angiopathy without gangrene: Secondary | ICD-10-CM | POA: Diagnosis not present

## 2024-07-22 DIAGNOSIS — Z95820 Peripheral vascular angioplasty status with implants and grafts: Secondary | ICD-10-CM | POA: Diagnosis not present

## 2024-07-22 DIAGNOSIS — E039 Hypothyroidism, unspecified: Secondary | ICD-10-CM | POA: Diagnosis not present

## 2024-07-22 DIAGNOSIS — Z7984 Long term (current) use of oral hypoglycemic drugs: Secondary | ICD-10-CM | POA: Diagnosis not present

## 2024-07-22 MED ORDER — ALPRAZOLAM 0.5 MG PO TABS: 0.5000 mg | ORAL_TABLET | Freq: Two times a day (BID) | ORAL | 2 refills | Status: AC | PRN

## 2024-07-22 NOTE — Telephone Encounter (Signed)
 Requesting: xanax  Contract:11/19/2023 UDS:11/19/2023 Last Visit:05/15/24 Next Visit:11/20/2024 Last Refill:03/09/2024  Please Advise

## 2024-07-29 ENCOUNTER — Other Ambulatory Visit

## 2024-07-30 DIAGNOSIS — Z7984 Long term (current) use of oral hypoglycemic drugs: Secondary | ICD-10-CM | POA: Diagnosis not present

## 2024-07-30 DIAGNOSIS — I671 Cerebral aneurysm, nonruptured: Secondary | ICD-10-CM | POA: Diagnosis not present

## 2024-07-30 DIAGNOSIS — Z95820 Peripheral vascular angioplasty status with implants and grafts: Secondary | ICD-10-CM | POA: Diagnosis not present

## 2024-07-30 DIAGNOSIS — E1169 Type 2 diabetes mellitus with other specified complication: Secondary | ICD-10-CM | POA: Diagnosis not present

## 2024-07-30 DIAGNOSIS — D649 Anemia, unspecified: Secondary | ICD-10-CM | POA: Diagnosis not present

## 2024-07-30 DIAGNOSIS — E785 Hyperlipidemia, unspecified: Secondary | ICD-10-CM | POA: Diagnosis not present

## 2024-07-30 DIAGNOSIS — Z7902 Long term (current) use of antithrombotics/antiplatelets: Secondary | ICD-10-CM | POA: Diagnosis not present

## 2024-07-30 DIAGNOSIS — E1151 Type 2 diabetes mellitus with diabetic peripheral angiopathy without gangrene: Secondary | ICD-10-CM | POA: Diagnosis not present

## 2024-07-30 DIAGNOSIS — M109 Gout, unspecified: Secondary | ICD-10-CM | POA: Diagnosis not present

## 2024-07-30 DIAGNOSIS — Z89432 Acquired absence of left foot: Secondary | ICD-10-CM | POA: Diagnosis not present

## 2024-07-30 DIAGNOSIS — Z7982 Long term (current) use of aspirin: Secondary | ICD-10-CM | POA: Diagnosis not present

## 2024-07-30 DIAGNOSIS — I2510 Atherosclerotic heart disease of native coronary artery without angina pectoris: Secondary | ICD-10-CM | POA: Diagnosis not present

## 2024-07-30 DIAGNOSIS — Z89412 Acquired absence of left great toe: Secondary | ICD-10-CM | POA: Diagnosis not present

## 2024-07-30 DIAGNOSIS — I714 Abdominal aortic aneurysm, without rupture, unspecified: Secondary | ICD-10-CM | POA: Diagnosis not present

## 2024-07-30 DIAGNOSIS — J4489 Other specified chronic obstructive pulmonary disease: Secondary | ICD-10-CM | POA: Diagnosis not present

## 2024-07-30 DIAGNOSIS — E1149 Type 2 diabetes mellitus with other diabetic neurological complication: Secondary | ICD-10-CM | POA: Diagnosis not present

## 2024-07-30 DIAGNOSIS — J9611 Chronic respiratory failure with hypoxia: Secondary | ICD-10-CM | POA: Diagnosis not present

## 2024-07-30 DIAGNOSIS — I10 Essential (primary) hypertension: Secondary | ICD-10-CM | POA: Diagnosis not present

## 2024-07-30 DIAGNOSIS — I771 Stricture of artery: Secondary | ICD-10-CM | POA: Diagnosis not present

## 2024-07-30 DIAGNOSIS — E039 Hypothyroidism, unspecified: Secondary | ICD-10-CM | POA: Diagnosis not present

## 2024-08-03 DIAGNOSIS — M109 Gout, unspecified: Secondary | ICD-10-CM | POA: Diagnosis not present

## 2024-08-03 DIAGNOSIS — J4489 Other specified chronic obstructive pulmonary disease: Secondary | ICD-10-CM | POA: Diagnosis not present

## 2024-08-03 DIAGNOSIS — J9611 Chronic respiratory failure with hypoxia: Secondary | ICD-10-CM | POA: Diagnosis not present

## 2024-08-03 DIAGNOSIS — D649 Anemia, unspecified: Secondary | ICD-10-CM | POA: Diagnosis not present

## 2024-08-03 DIAGNOSIS — E039 Hypothyroidism, unspecified: Secondary | ICD-10-CM | POA: Diagnosis not present

## 2024-08-03 DIAGNOSIS — Z89432 Acquired absence of left foot: Secondary | ICD-10-CM | POA: Diagnosis not present

## 2024-08-03 DIAGNOSIS — I714 Abdominal aortic aneurysm, without rupture, unspecified: Secondary | ICD-10-CM | POA: Diagnosis not present

## 2024-08-03 DIAGNOSIS — E1149 Type 2 diabetes mellitus with other diabetic neurological complication: Secondary | ICD-10-CM | POA: Diagnosis not present

## 2024-08-03 DIAGNOSIS — Z7902 Long term (current) use of antithrombotics/antiplatelets: Secondary | ICD-10-CM | POA: Diagnosis not present

## 2024-08-03 DIAGNOSIS — I2510 Atherosclerotic heart disease of native coronary artery without angina pectoris: Secondary | ICD-10-CM | POA: Diagnosis not present

## 2024-08-03 DIAGNOSIS — E1151 Type 2 diabetes mellitus with diabetic peripheral angiopathy without gangrene: Secondary | ICD-10-CM | POA: Diagnosis not present

## 2024-08-03 DIAGNOSIS — I671 Cerebral aneurysm, nonruptured: Secondary | ICD-10-CM | POA: Diagnosis not present

## 2024-08-03 DIAGNOSIS — Z95820 Peripheral vascular angioplasty status with implants and grafts: Secondary | ICD-10-CM | POA: Diagnosis not present

## 2024-08-03 DIAGNOSIS — Z7984 Long term (current) use of oral hypoglycemic drugs: Secondary | ICD-10-CM | POA: Diagnosis not present

## 2024-08-03 DIAGNOSIS — E785 Hyperlipidemia, unspecified: Secondary | ICD-10-CM | POA: Diagnosis not present

## 2024-08-03 DIAGNOSIS — Z7982 Long term (current) use of aspirin: Secondary | ICD-10-CM | POA: Diagnosis not present

## 2024-08-03 DIAGNOSIS — I10 Essential (primary) hypertension: Secondary | ICD-10-CM | POA: Diagnosis not present

## 2024-08-03 DIAGNOSIS — Z89412 Acquired absence of left great toe: Secondary | ICD-10-CM | POA: Diagnosis not present

## 2024-08-03 DIAGNOSIS — E1169 Type 2 diabetes mellitus with other specified complication: Secondary | ICD-10-CM | POA: Diagnosis not present

## 2024-08-03 DIAGNOSIS — I771 Stricture of artery: Secondary | ICD-10-CM | POA: Diagnosis not present

## 2024-08-10 ENCOUNTER — Ambulatory Visit: Admitting: Podiatry

## 2024-08-10 DIAGNOSIS — E11621 Type 2 diabetes mellitus with foot ulcer: Secondary | ICD-10-CM

## 2024-08-10 DIAGNOSIS — L97529 Non-pressure chronic ulcer of other part of left foot with unspecified severity: Secondary | ICD-10-CM

## 2024-08-10 DIAGNOSIS — M2042 Other hammer toe(s) (acquired), left foot: Secondary | ICD-10-CM

## 2024-08-10 DIAGNOSIS — Z89432 Acquired absence of left foot: Secondary | ICD-10-CM

## 2024-08-11 ENCOUNTER — Telehealth: Payer: Self-pay

## 2024-08-11 ENCOUNTER — Encounter: Payer: Self-pay | Admitting: Family Medicine

## 2024-08-11 DIAGNOSIS — D649 Anemia, unspecified: Secondary | ICD-10-CM | POA: Diagnosis not present

## 2024-08-11 DIAGNOSIS — Z89432 Acquired absence of left foot: Secondary | ICD-10-CM | POA: Diagnosis not present

## 2024-08-11 DIAGNOSIS — E039 Hypothyroidism, unspecified: Secondary | ICD-10-CM | POA: Diagnosis not present

## 2024-08-11 DIAGNOSIS — I10 Essential (primary) hypertension: Secondary | ICD-10-CM | POA: Diagnosis not present

## 2024-08-11 DIAGNOSIS — E1169 Type 2 diabetes mellitus with other specified complication: Secondary | ICD-10-CM | POA: Diagnosis not present

## 2024-08-11 DIAGNOSIS — E785 Hyperlipidemia, unspecified: Secondary | ICD-10-CM | POA: Diagnosis not present

## 2024-08-11 DIAGNOSIS — Z7902 Long term (current) use of antithrombotics/antiplatelets: Secondary | ICD-10-CM | POA: Diagnosis not present

## 2024-08-11 DIAGNOSIS — I771 Stricture of artery: Secondary | ICD-10-CM | POA: Diagnosis not present

## 2024-08-11 DIAGNOSIS — M109 Gout, unspecified: Secondary | ICD-10-CM | POA: Diagnosis not present

## 2024-08-11 DIAGNOSIS — Z89412 Acquired absence of left great toe: Secondary | ICD-10-CM | POA: Diagnosis not present

## 2024-08-11 DIAGNOSIS — J9611 Chronic respiratory failure with hypoxia: Secondary | ICD-10-CM | POA: Diagnosis not present

## 2024-08-11 DIAGNOSIS — J4489 Other specified chronic obstructive pulmonary disease: Secondary | ICD-10-CM | POA: Diagnosis not present

## 2024-08-11 DIAGNOSIS — I671 Cerebral aneurysm, nonruptured: Secondary | ICD-10-CM | POA: Diagnosis not present

## 2024-08-11 DIAGNOSIS — E1151 Type 2 diabetes mellitus with diabetic peripheral angiopathy without gangrene: Secondary | ICD-10-CM | POA: Diagnosis not present

## 2024-08-11 DIAGNOSIS — I714 Abdominal aortic aneurysm, without rupture, unspecified: Secondary | ICD-10-CM | POA: Diagnosis not present

## 2024-08-11 DIAGNOSIS — I2510 Atherosclerotic heart disease of native coronary artery without angina pectoris: Secondary | ICD-10-CM | POA: Diagnosis not present

## 2024-08-11 DIAGNOSIS — Z7984 Long term (current) use of oral hypoglycemic drugs: Secondary | ICD-10-CM | POA: Diagnosis not present

## 2024-08-11 DIAGNOSIS — Z95820 Peripheral vascular angioplasty status with implants and grafts: Secondary | ICD-10-CM | POA: Diagnosis not present

## 2024-08-11 DIAGNOSIS — Z7982 Long term (current) use of aspirin: Secondary | ICD-10-CM | POA: Diagnosis not present

## 2024-08-11 DIAGNOSIS — E1149 Type 2 diabetes mellitus with other diabetic neurological complication: Secondary | ICD-10-CM | POA: Diagnosis not present

## 2024-08-11 DIAGNOSIS — E11621 Type 2 diabetes mellitus with foot ulcer: Secondary | ICD-10-CM

## 2024-08-11 NOTE — Progress Notes (Signed)
 Subjective: Chief Complaint  Patient presents with   Status post transmetatarsal amputation of foot, left     Status post transmetatarsal amputation of foot, left ( Pt stated that he Is doing well he has no concerns at this time     78 year old male presents the office today for follow-up evaluation status post left transmetatarsal amputation.  States he has been doing well.  He continues to walk in the cam boot and has been walking more with physical therapy.  The wounds have healed and there is no drainage or any redness.  Still some residual swelling but improving as well.  Does not report any fevers or chills.  DOS: 05/20/2024  Objective: AAO x3, NAD DP/PT pulses palpable bilaterally, CRT less than 3 seconds Sensation decreased. Status post left TMA incision is well-healed at this time and scar is formed.  Area centrally is healed that was present previously last appointment.  Is no erythema or warmth there is no drainage or pus.  There is no increase in temperature.  Mild edema still present but appears to be improving as well.  There is no tenderness on exam. No pain with calf compression, swelling, warmth, erythema       Assessment: Status post left TMA  Plan: -All treatment options discussed with the patient including all alternatives, risks, complications.  -He is going to make an appointment to come see Sueanne, pedorthist for modification of his inserts to help with getting back into a shoe with a toe filler.  We are still waiting insurance approval for his new inserts, shoe.  I would continue the cam boot for now until he gets the inserts made to wear in his shoe then he can gradually start to transition to a shoe.  I do think it is reasonable return to work in November as long as he can sit and not do a lot of standing.  Discussed the importance of daily foot inspection.  Continue working with physical therapy.  No follow-ups on file.  [name] DPM

## 2024-08-11 NOTE — Telephone Encounter (Signed)
 Ppwk refaxed for shoes

## 2024-08-14 NOTE — Telephone Encounter (Signed)
 PPWK IN EXP 11/13/2024 INDEXED IN MEDIA

## 2024-08-20 ENCOUNTER — Ambulatory Visit

## 2024-08-20 NOTE — Progress Notes (Signed)
 Order placed for DM shoes, inserts and toe filler with SS  L5000 checked [facility] Notification or Prior Authorization is not required for the requested services You are not required to submit a notification/prior authorization based on the information provided. If you have general questions about the prior authorization requirements, visit [email or web address] > Clinician Resources > Advance and Admission Notification Requirements. The number above acknowledges your notification. Please write this reference number down for future reference. If you would like to request an organization determination, please call us  at [phone]. Decision ID #: [id number] The number above acknowledges your inquiry and our response. Please write this number down and refer to it for future inquiries. Coverage and payment for an item or service is governed by the member's benefit plan document, and, if applicable, the provider's participation agreement with the Health Plan.  Patient will be notified when in  [name] CPed, CFo, CFm

## 2024-08-24 ENCOUNTER — Ambulatory Visit: Payer: Self-pay | Admitting: Cardiovascular Disease

## 2024-08-24 ENCOUNTER — Ambulatory Visit (HOSPITAL_COMMUNITY)
Admission: RE | Admit: 2024-08-24 | Discharge: 2024-08-24 | Disposition: A | Discharge status: Home / Self Care | Source: Ambulatory Visit | Attending: Surgery | Admitting: Surgery

## 2024-08-24 DIAGNOSIS — I6523 Occlusion and stenosis of bilateral carotid arteries: Secondary | ICD-10-CM | POA: Diagnosis not present

## 2024-08-26 ENCOUNTER — Emergency Department (HOSPITAL_COMMUNITY)

## 2024-08-26 ENCOUNTER — Encounter (HOSPITAL_COMMUNITY): Payer: Self-pay

## 2024-08-26 ENCOUNTER — Other Ambulatory Visit: Payer: Self-pay

## 2024-08-26 ENCOUNTER — Emergency Department (HOSPITAL_COMMUNITY)
Admission: EM | Admit: 2024-08-26 | Discharge: 2024-08-26 | Disposition: A | Discharge status: Home / Self Care | Attending: Emergency Medicine | Admitting: Emergency Medicine

## 2024-08-26 ENCOUNTER — Ambulatory Visit: Payer: Self-pay

## 2024-08-26 DIAGNOSIS — I4510 Unspecified right bundle-branch block: Secondary | ICD-10-CM | POA: Insufficient documentation

## 2024-08-26 DIAGNOSIS — Z7984 Long term (current) use of oral hypoglycemic drugs: Secondary | ICD-10-CM | POA: Insufficient documentation

## 2024-08-26 DIAGNOSIS — I493 Ventricular premature depolarization: Secondary | ICD-10-CM | POA: Insufficient documentation

## 2024-08-26 DIAGNOSIS — R531 Weakness: Secondary | ICD-10-CM | POA: Insufficient documentation

## 2024-08-26 DIAGNOSIS — E119 Type 2 diabetes mellitus without complications: Secondary | ICD-10-CM | POA: Diagnosis not present

## 2024-08-26 DIAGNOSIS — J449 Chronic obstructive pulmonary disease, unspecified: Secondary | ICD-10-CM | POA: Diagnosis not present

## 2024-08-26 DIAGNOSIS — Z794 Long term (current) use of insulin: Secondary | ICD-10-CM | POA: Diagnosis not present

## 2024-08-26 DIAGNOSIS — Z7951 Long term (current) use of inhaled steroids: Secondary | ICD-10-CM | POA: Diagnosis not present

## 2024-08-26 DIAGNOSIS — F0390 Unspecified dementia without behavioral disturbance: Secondary | ICD-10-CM | POA: Insufficient documentation

## 2024-08-26 DIAGNOSIS — Z79899 Other long term (current) drug therapy: Secondary | ICD-10-CM | POA: Diagnosis not present

## 2024-08-26 DIAGNOSIS — R42 Dizziness and giddiness: Secondary | ICD-10-CM | POA: Diagnosis not present

## 2024-08-26 DIAGNOSIS — I10 Essential (primary) hypertension: Secondary | ICD-10-CM | POA: Insufficient documentation

## 2024-08-26 DIAGNOSIS — I959 Hypotension, unspecified: Secondary | ICD-10-CM | POA: Diagnosis not present

## 2024-08-26 DIAGNOSIS — R031 Nonspecific low blood-pressure reading: Secondary | ICD-10-CM

## 2024-08-26 LAB — CBC WITH DIFFERENTIAL/PLATELET
Abs Immature Granulocytes: 0.03 K/uL (ref 0.00–0.07)
Basophils Absolute: 0 K/uL (ref 0.0–0.1)
Basophils Relative: 1 %
Eosinophils Absolute: 0.3 K/uL (ref 0.0–0.5)
Eosinophils Relative: 5 %
HCT: 40.9 % (ref 39.0–52.0)
Hemoglobin: 12.8 g/dL — ABNORMAL LOW (ref 13.0–17.0)
Immature Granulocytes: 1 %
Lymphocytes Relative: 32 %
Lymphs Abs: 1.9 K/uL (ref 0.7–4.0)
MCH: 31.1 pg (ref 26.0–34.0)
MCHC: 31.3 g/dL (ref 30.0–36.0)
MCV: 99.5 fL (ref 80.0–100.0)
Monocytes Absolute: 0.5 K/uL (ref 0.1–1.0)
Monocytes Relative: 9 %
Neutro Abs: 3.2 K/uL (ref 1.7–7.7)
Neutrophils Relative %: 52 %
Platelets: 197 K/uL (ref 150–400)
RBC: 4.11 MIL/uL — ABNORMAL LOW (ref 4.22–5.81)
RDW: 12.7 % (ref 11.5–15.5)
WBC: 5.9 K/uL (ref 4.0–10.5)
nRBC: 0 % (ref 0.0–0.2)

## 2024-08-26 LAB — COMPREHENSIVE METABOLIC PANEL WITH GFR
ALT: 20 U/L (ref 0–44)
AST: 20 U/L (ref 15–41)
Albumin: 3.4 g/dL — ABNORMAL LOW (ref 3.5–5.0)
Alkaline Phosphatase: 73 U/L (ref 38–126)
Anion gap: 8 (ref 5–15)
BUN: 17 mg/dL (ref 8–23)
CO2: 34 mmol/L — ABNORMAL HIGH (ref 22–32)
Calcium: 8.8 mg/dL — ABNORMAL LOW (ref 8.9–10.3)
Chloride: 95 mmol/L — ABNORMAL LOW (ref 98–111)
Creatinine, Ser: 1.06 mg/dL (ref 0.61–1.24)
GFR, Estimated: >60 mL/min (ref >60)
Glucose, Bld: 194 mg/dL — ABNORMAL HIGH (ref 70–99)
Potassium: 4.3 mmol/L (ref 3.5–5.1)
Sodium: 137 mmol/L (ref 135–145)
Total Bilirubin: 0.5 mg/dL (ref 0.0–1.2)
Total Protein: 6.4 g/dL — ABNORMAL LOW (ref 6.5–8.1)

## 2024-08-26 LAB — URINALYSIS, ROUTINE W REFLEX MICROSCOPIC
Bilirubin Urine: NEGATIVE
Glucose, UA: NEGATIVE mg/dL
Hgb urine dipstick: NEGATIVE
Ketones, ur: NEGATIVE mg/dL
Leukocytes,Ua: NEGATIVE
Nitrite: NEGATIVE
Protein, ur: NEGATIVE mg/dL
Specific Gravity, Urine: 1.019 (ref 1.005–1.030)
pH: 6 (ref 5.0–8.0)

## 2024-08-26 LAB — RESP PANEL BY RT-PCR (RSV, FLU A&B, COVID)  RVPGX2
Influenza A by PCR: NEGATIVE
Influenza B by PCR: NEGATIVE
Resp Syncytial Virus by PCR: NEGATIVE
SARS Coronavirus 2 by RT PCR: NEGATIVE

## 2024-08-26 LAB — BRAIN NATRIURETIC PEPTIDE: B Natriuretic Peptide: 122.5 pg/mL — ABNORMAL HIGH (ref 0.0–100.0)

## 2024-08-26 LAB — MAGNESIUM: Magnesium: 1.9 mg/dL (ref 1.7–2.4)

## 2024-08-26 NOTE — Discharge Instructions (Signed)
 Your history, exam, and evaluation today seem consistent with transient lightheadedness related to softer blood pressures that you had during physical therapy today.  You were here for over 6 hours without recurrent lightheadedness or low blood pressures and your workup was overall reassuring.  Please increase your hydration and call your primary team tomorrow to discuss blood pressure medication management.  If any symptoms change or worsen acutely, please return to the nearest emergency department.

## 2024-08-26 NOTE — Telephone Encounter (Signed)
 Appt scheduled

## 2024-08-26 NOTE — ED Triage Notes (Signed)
 Pt came in via POV d/t approx. 1.5 hr ago his BP was ranging from 90 SBP to 110/60 when he had his therapist come to his house. A/Ox4, denies any light headed/dizziness, just feeling weak. Denies pain in triage.

## 2024-08-26 NOTE — Telephone Encounter (Signed)
 FYI Only or Action Required?: FYI only for provider.  Patient was last seen in primary care on 05/15/2024 by Wheeler Harlene CROME, NP.  Called Nurse Triage reporting blood pressure low.  Symptoms began today.  Interventions attempted: Nothing.  Symptoms are: unchanged.  Triage Disposition: See HCP Within 4 Hours (Or PCP Triage)  Patient/caregiver understands and will follow disposition?: Yes  Copied from CRM #[id number]. Topic: Clinical - Red Word Triage >> Aug 26, 2024  2:33 PM Taleah C wrote: Red Word that prompted transfer to Nurse Triage: Elenor from [facility] called and stated that the patient complained of feeling weak and tired and his bp was elevating high and low throughout their physical therapy session Reason for Disposition  [1] Systolic BP 90-110 AND [2] taking blood pressure medications AND [3] feeling weak or lightheaded  Answer Assessment - Initial Assessment Questions Elenor, PT with [facility] called to report symptoms.  Spoke to patient's wife who plans to take to ED as recommended LVM for patient  1. BLOOD PRESSURE: What is your blood pressure? Did you take at least two measurements 5 minutes apart?     94/60 at beginning of home PT 140/80 during exercise 100/60 after resting 2. ONSET: When did you take your blood pressure?     During PT session Baseline: 120-130 systolic, diastolic 70-80 3. HOW: How did you take your blood pressure? (e.g., visiting nurse, automatic home BP monitor)     Manual cuff 4. HISTORY: Do you have a history of low blood pressure? What is your blood pressure normally?     History of HTN, on two medications 5. MEDICINES: Are you taking any medicines for blood pressure? If Yes, ask: Have they been changed recently?     No recent changes 6. PULSE RATE: Do you know what your pulse rate is?      unknown 7. OTHER SYMPTOMS: Have you been sick recently? Have you had a recent injury?     This morning started feeling  fatigued  Protocols used: Blood Pressure - Low-A-AH

## 2024-08-26 NOTE — ED Provider Notes (Signed)
 [place] EMERGENCY DEPARTMENT AT [facility] Provider Note   CSN: [id number] Arrival date & time: 08/26/24  1538     Patient presents with: Hypotension   [name] is a 78 y.o. male.   The history is provided by the patient and medical records. No language interpreter was used.  Dizziness Quality:  Lightheadedness Severity:  Moderate Onset quality:  Gradual Duration:  1 day Timing:  Sporadic Progression:  Resolved Chronicity:  New Relieved by:  Nothing Worsened by:  Nothing Ineffective treatments:  None tried Associated symptoms: no chest pain, no diarrhea, no headaches, no nausea, no palpitations, no shortness of breath, no syncope, no vision changes, no vomiting and no weakness        Prior to Admission medications   Medication Sig Start Date End Date Taking? Authorizing Provider  albuterol  (PROVENTIL ) (2.5 MG/3ML) 0.083% nebulizer solution Take 3 mLs (2.5 mg total) by nebulization every 6 (six) hours as needed for wheezing or shortness of breath. 12/23/23   Domenica Harlene DELENA, MD  albuterol  (VENTOLIN  HFA) 108 [phone] Base) MCG/ACT inhaler Inhale 2 puffs into the lungs every 6 (six) hours as needed. 12/25/23   Meade Verdon RAMAN, MD  ALPRAZolam  (XANAX ) 0.5 MG tablet Take 1 tablet (0.5 mg total) by mouth 2 (two) times daily as needed for anxiety. 07/22/24   Domenica Harlene DELENA, MD  arformoterol  (BROVANA ) 15 MCG/2ML NEBU Substituted for: Brovana  Neb Solution Inhale one vial via nebulizer once daily. 02/10/24   Meade Verdon RAMAN, MD  Ascorbic Acid  (VITAMIN C ) 1000 MG tablet Take 1,000 mg by mouth daily.    [provider]  aspirin  EC 81 MG tablet Take 81 mg by mouth at bedtime.    [provider]  atorvastatin  (LIPITOR ) 80 MG tablet TAKE 1 TABLET BY MOUTH EVERY DAY 06/13/24   Domenica Harlene DELENA, MD  BD PEN NEEDLE NANO 2ND GEN 32G X 4 MM MISC USE AS DIRECTED WITH HUMALOG  PEN 09/11/21   [provider]  Biotin 1000 MCG tablet Take 1,000 mg by mouth daily.     [provider]  Blood Glucose Monitoring Suppl (ACCU-CHEK AVIVA PLUS) w/Device KIT Check blood sugars three times daily 11/16/22   Domenica Harlene DELENA, MD  budesonide  (PULMICORT ) 0.25 MG/2ML nebulizer solution Take 2 mLs (0.25 mg total) by nebulization daily. 03/19/23   Meade Verdon RAMAN, MD  carvedilol  (COREG ) 25 MG tablet TAKE ONE-HALF TABLET BY MOUTH TWICE DAILY WITH MEALS 06/13/24   Domenica Harlene DELENA, MD  cholecalciferol  (VITAMIN D ) 1000 UNITS tablet Take 1,000 Units by mouth daily.    [provider]  clopidogrel  (PLAVIX ) 75 MG tablet Take 1 tablet (75 mg total) by mouth daily. 12/23/23   Domenica Harlene DELENA, MD  dorzolamide -timolol  (COSOPT ) 2-0.5 % ophthalmic solution Place 1 drop into both eyes daily.    [provider]  ezetimibe  (ZETIA ) 10 MG tablet TAKE 1 TABLET BY MOUTH EVERY DAY 12/05/23   Domenica Harlene DELENA, MD  FLUoxetine  (PROZAC ) 40 MG capsule Take 1 capsule (40 mg total) by mouth daily. 02/18/24   Domenica Harlene DELENA, MD  glimepiride  (AMARYL ) 2 MG tablet TAKE 1 TABLET BY MOUTH 2 TIMES DAILY. 06/22/24   Domenica Harlene DELENA, MD  glucose blood (ACCU-CHEK GUIDE TEST) test strip Check blood sugars 3 times daily 09/23/23   Domenica Harlene DELENA, MD  HYDROcodone -acetaminophen  (NORCO) 7.5-325 MG tablet Take 1-2 tablets by mouth every 12 (twelve) hours as needed for severe pain (pain score 7-10) or moderate pain (  pain score 4-6). 05/25/24   Gershon Donnice SAUNDERS, DPM  insulin  lispro (HUMALOG  KWIKPEN) 100 UNIT/ML KwikPen Before each meal 3 times a day, 140-199 - 3 units, 200-250 - 6 units, 251-299 - 8 units,  300-349 - 10 units,  350 or above 12 units. Insulin  PEN if approved, provide syringes and needles if needed. 05/15/22   Domenica Harlene LABOR, MD  Lancets (ACCU-CHEK MULTICLIX) lancets Check blood sugars three times daily 11/16/22   Domenica Harlene LABOR, MD  Latanoprost  0.005 % EMUL Place 1 drop into both eyes in the morning and at bedtime.    [provider]  levothyroxine  (SYNTHROID ) 25 MCG tablet Take 1  tablet (25 mcg total) by mouth daily before breakfast. 12/23/23   Domenica Harlene LABOR, MD  losartan  (COZAAR ) 50 MG tablet Take 1 tablet (50 mg total) by mouth daily. 12/23/23   Domenica Harlene LABOR, MD  Magnesium  500 MG TABS Take 500 mg by mouth daily.    [provider]  memantine  (NAMENDA ) 5 MG tablet Take 1 tablet (5 mg total) by mouth 2 (two) times daily. 09/13/23   Wertman, Sara E, PA-C  Multiple Vitamin (MULTIVITAMIN) tablet Take 1 tablet by mouth daily.    [provider]  Omega-3 Fatty Acids (FISH OIL) 1000 MG CAPS Take 1,000 mg by mouth 2 (two) times daily.    [provider]  pioglitazone  (ACTOS ) 30 MG tablet Take 1 tablet (30 mg total) by mouth daily. 05/28/24   Domenica Harlene LABOR, MD  prednisoLONE  acetate (PRED FORTE ) 1 % ophthalmic suspension Place 1 drop into the right eye 2 (two) times daily. 05/04/24   [provider]  pregabalin  (LYRICA ) 150 MG capsule TAKE 1 CAPSULE BY MOUTH TWICE A DAY 03/03/24   Domenica Harlene LABOR, MD  silver  sulfADIAZINE  (SILVADENE ) 1 % cream Apply 1 Application topically daily. 07/10/24   Gershon Donnice SAUNDERS, DPM  vitamin B-12 (CYANOCOBALAMIN) 1000 MCG tablet Take 1,000 mcg by mouth See admin instructions. Take one tablet by mouth twice weekly on Monday and Thursday per wife    [provider]  zinc  gluconate 50 MG tablet Take 50 mg by mouth daily.    [provider]    Allergies: Metformin  and Prednisone     Review of Systems  Constitutional:  Positive for fatigue. Negative for chills, diaphoresis and fever.  HENT:  Negative for congestion.   Respiratory:  Negative for cough, chest tightness, shortness of breath and wheezing.   Cardiovascular:  Negative for chest pain, palpitations, leg swelling and syncope.  Gastrointestinal:  Negative for abdominal pain, constipation, diarrhea, nausea and vomiting.  Genitourinary:  Negative for dysuria, flank pain and frequency.  Musculoskeletal:  Negative for back pain, neck pain and neck  stiffness.  Skin:  Negative for rash and wound.  Neurological:  Positive for light-headedness. Negative for dizziness, syncope, speech difficulty, weakness and headaches.  Psychiatric/Behavioral:  Negative for agitation and confusion.   All other systems reviewed and are negative.   Updated Vital Signs BP 113/62   Pulse (!) 57   Temp 98.2 F (36.8 C) (Oral)   Resp 18   Ht 5' 9 (1.753 m)   Wt 113.4 kg   SpO2 93%   BMI 36.92 kg/m   Physical Exam Vitals and nursing note reviewed.  Constitutional:      General: He is not in acute distress.    Appearance: He is well-developed. He is not ill-appearing, toxic-appearing or diaphoretic.  HENT:     Head: Normocephalic and  atraumatic.     Nose: Nose normal.     Mouth/Throat:     Mouth: Mucous membranes are dry.     Pharynx: No oropharyngeal exudate or posterior oropharyngeal erythema.  Eyes:     Extraocular Movements: Extraocular movements intact.     Conjunctiva/sclera: Conjunctivae normal.     Pupils: Pupils are equal, round, and reactive to light.  Cardiovascular:     Rate and Rhythm: Normal rate and regular rhythm.     Heart sounds: No murmur heard. Pulmonary:     Effort: Pulmonary effort is normal. No respiratory distress.     Breath sounds: Normal breath sounds. No wheezing, rhonchi or rales.  Chest:     Chest wall: No tenderness.  Abdominal:     General: Abdomen is flat.     Palpations: Abdomen is soft.     Tenderness: There is no abdominal tenderness. There is no guarding or rebound.  Musculoskeletal:        General: No swelling or tenderness.     Cervical back: Neck supple.     Right lower leg: No edema.     Left lower leg: No edema.  Skin:    General: Skin is warm and dry.     Capillary Refill: Capillary refill takes less than 2 seconds.     Findings: No erythema or rash.  Neurological:     General: No focal deficit present.     Mental Status: He is alert.  Psychiatric:        Mood and Affect: Mood normal.      (all labs ordered are listed, but only abnormal results are displayed) Labs Reviewed  CBC WITH DIFFERENTIAL/PLATELET - Abnormal; Notable for the following components:      Result Value   RBC 4.11 (*)    Hemoglobin 12.8 (*)    All other components within normal limits  COMPREHENSIVE METABOLIC PANEL WITH GFR - Abnormal; Notable for the following components:   Chloride 95 (*)    CO2 34 (*)    Glucose, Bld 194 (*)    Calcium  8.8 (*)    Total Protein 6.4 (*)    Albumin 3.4 (*)    All other components within normal limits  BRAIN NATRIURETIC PEPTIDE - Abnormal; Notable for the following components:   B Natriuretic Peptide 122.5 (*)    All other components within normal limits  URINALYSIS, ROUTINE W REFLEX MICROSCOPIC - Abnormal; Notable for the following components:   Color, Urine AMBER (*)    APPearance HAZY (*)    All other components within normal limits  RESP PANEL BY RT-PCR (RSV, FLU A&B, COVID)  RVPGX2  MAGNESIUM   TSH  AMMONIA    EKG: EKG Interpretation Date/Time:  Wednesday August 26 2024 16:36:20 EDT Ventricular Rate:  66 PR Interval:  188 QRS Duration:  140 QT Interval:  444 QTC Calculation: 465 R Axis:   190  Text Interpretation: Sinus rhythm with occasional Premature ventricular complexes Right bundle branch block Abnormal ECG When compared with ECG of 10-Mar-2024 08:58, PREVIOUS ECG IS PRESENT when compared to prior, similar appearance no STEMI Confirmed by Ginger Barefoot ([id number]) on 08/26/2024 7:55:54 PM  Radiology: DG Chest 2 View Result Date: 08/26/2024 EXAM: 2 VIEW(S) XRAY OF THE CHEST 08/26/2024 05:02:00 PM COMPARISON: 3 / 19 / 24 CLINICAL HISTORY: hypotension. Reason for Exam: hypotension; Triage Note: Pt complains of low blood pressure while he was at therapy. No chest pain, shortness of breath, cough or dysuria. He does drink lots of  caffeine and minimal amounts of water. He drinks other fruit drinks. Endorses some generalized weakness. FINDINGS: LUNGS  AND PLEURA: No focal pulmonary opacity. No pulmonary edema. No pleural effusion. No pneumothorax. HEART AND MEDIASTINUM: Atherosclerotic calcifications of the aortic arch. No acute abnormality of the cardiac and mediastinal silhouettes. BONES AND SOFT TISSUES: Multilevel degenerative changes of the thoracic spine. IMPRESSION: 1. No acute cardiopulmonary pathology. Electronically signed by: Norman Gatlin MD 08/26/2024 05:07 PM EDT RP Workstation: [id number]     Procedures   Medications Ordered in the ED - No data to display                                  Medical Decision Making   [name] is a 78 y.o. male with a past medical history significant for hypertension, hyperlipidemia, diabetes, AAA, COPD, dementia, and GERD who presents with fatigue and transient soft blood pressures.  According to patient, he was getting home physical therapy and after exercising he sat down and felt fatigued.  He did not have any near-syncope or syncope but just felt tired.  His caregiver checked his blood pressure and it was about 90 systolic.  It gradually improved and patient had resolution of symptoms.  Due to his soft pressures they sent him here for evaluation.  Patient says he has been drinking a normal water.  He denies any fevers, chills, congestion, cough, palpitations, nausea, vomiting, constipation, diarrhea, or urinary changes.  He said he has had no recent injuries or trauma.  Denies any leg pain or leg swelling.  He otherwise feels at his baseline, just slightly fatigued.  On exam, lungs clear and chest nontender.  Abdomen nontender.  Abdomen did have bowel sounds.  Legs have intact sensation and strength on my exam.  Symmetric grip strength.  No focal neurologic deficits.  No tenderness on my exam.  No rales.  Patient had some workup in triage including some labs and workup to look for occult infection.  Chest x-ray did not show pneumonia and he is negative for COVID/flu/RSV.  His urinalysis  does not show infection.  BNP is slightly improved from prior at 122 and his kidney function is normal.  Otherwise his labs overall reassuring.  Had a shared Szymon conversation with patient and family and I specked he was slightly dehydrated based on his dry mouth and this transient lightheadedness.  We offered IV fluids but he would rather try oral fluids.  If he tolerates this and is able to ambulate after 5-1/2 hours of observation without recurrent symptoms I feel he is likely safe for discharge home given the otherwise negative workup.  Patient agrees.  Anticipate discharge after reassessment and p.o. challenge  Patient passed p.o. challenge and is feeling better.  I stood him up and he had no lightheadedness or fatigue.  He is feeling well would like to go home.      Final diagnoses:  Intermittent lightheadedness  Low blood pressure reading    ED Discharge Orders     None       Clinical Impression: 1. Intermittent lightheadedness   2. Low blood pressure reading     Disposition: Discharge  Condition: Good  I have discussed the results, Dx and Tx plan with the pt(& family if present). He/she/they expressed understanding and agree(s) with the plan. Discharge instructions discussed at great length. Strict return precautions discussed and pt &/or family have verbalized understanding of  the instructions. No further questions at time of discharge.    Discharge Medication List as of 08/26/2024 10:26 PM      Follow Up: Domenica Harlene LABOR, MD [address] [place] [address] [phone]     [facility] Emergency Department at [facility] [address] [place]  [address] [phone]         Iyanah Demont, Lonni PARAS, MD 08/26/24 [phone]

## 2024-08-26 NOTE — ED Provider Triage Note (Signed)
 Emergency Medicine Provider Triage Evaluation Note  [name] , a 78 y.o. male  was evaluated in triage.  Pt complains of low blood pressure while he was at therapy.  No chest pain, shortness of breath, cough or dysuria.  He does drink lots of caffeine and minimal amounts of water.  He drinks other fruit drinks.  Endorses some generalized weakness  Review of Systems  Positive: As above Negative: As above  Physical Exam  BP (!) 121/56 (BP Location: Left Arm)   Pulse 75   Temp 97.6 F (36.4 C)   Resp 16   Ht 5' 9 (1.753 m)   Wt 113.4 kg   SpO2 94%   BMI 36.92 kg/m  Gen:   Awake, no distress   Resp:  Normal effort  MSK:   Moves extremities without difficulty  Other:    Medical Decision Making  Medically screening exam initiated at 4:07 PM.  Appropriate orders placed.  [name] was informed that the remainder of the evaluation will be completed by another provider, this initial triage assessment does not replace that evaluation, and the importance of remaining in the ED until their evaluation is complete.    Donald Loge, [name] 08/26/24 [phone]

## 2024-08-27 ENCOUNTER — Encounter: Payer: Self-pay | Admitting: Student

## 2024-08-27 NOTE — Assessment & Plan Note (Signed)
 Denies recent exacerbations. Follows with Pulmonology.

## 2024-08-27 NOTE — Assessment & Plan Note (Signed)
Following with neurology and tolerating Namenda

## 2024-08-27 NOTE — Assessment & Plan Note (Signed)
 Stable on current meds, no changes

## 2024-08-27 NOTE — Assessment & Plan Note (Signed)
 Tolerating statin, encouraged heart healthy diet, avoid trans fats, minimize simple carbs and saturated fats. Increase exercise as tolerated
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
## 2024-08-27 NOTE — Assessment & Plan Note (Signed)
Stable without daily medication. Monitor.

## 2024-08-27 NOTE — Assessment & Plan Note (Signed)
 Well controlled, no changes to meds. Encouraged heart healthy diet such as the DASH diet and exercise as tolerated.

## 2024-08-28 ENCOUNTER — Ambulatory Visit: Payer: Self-pay | Admitting: Student

## 2024-08-28 ENCOUNTER — Encounter: Payer: Self-pay | Admitting: Student

## 2024-08-28 ENCOUNTER — Ambulatory Visit (INDEPENDENT_AMBULATORY_CARE_PROVIDER_SITE_OTHER): Admitting: Student

## 2024-08-28 VITALS — BP 113/64 | HR 87 | Temp 97.7°F | Ht 69.0 in | Wt 255.4 lb

## 2024-08-28 DIAGNOSIS — I10 Essential (primary) hypertension: Secondary | ICD-10-CM

## 2024-08-28 DIAGNOSIS — E039 Hypothyroidism, unspecified: Secondary | ICD-10-CM

## 2024-08-28 DIAGNOSIS — F32A Depression, unspecified: Secondary | ICD-10-CM

## 2024-08-28 DIAGNOSIS — K219 Gastro-esophageal reflux disease without esophagitis: Secondary | ICD-10-CM

## 2024-08-28 DIAGNOSIS — F419 Anxiety disorder, unspecified: Secondary | ICD-10-CM

## 2024-08-28 DIAGNOSIS — J9611 Chronic respiratory failure with hypoxia: Secondary | ICD-10-CM

## 2024-08-28 DIAGNOSIS — Z23 Encounter for immunization: Secondary | ICD-10-CM | POA: Diagnosis not present

## 2024-08-28 DIAGNOSIS — E1169 Type 2 diabetes mellitus with other specified complication: Secondary | ICD-10-CM | POA: Diagnosis not present

## 2024-08-28 DIAGNOSIS — E1151 Type 2 diabetes mellitus with diabetic peripheral angiopathy without gangrene: Secondary | ICD-10-CM

## 2024-08-28 DIAGNOSIS — E66812 Obesity, class 2: Secondary | ICD-10-CM

## 2024-08-28 DIAGNOSIS — J432 Centrilobular emphysema: Secondary | ICD-10-CM | POA: Diagnosis not present

## 2024-08-28 DIAGNOSIS — Z89432 Acquired absence of left foot: Secondary | ICD-10-CM

## 2024-08-28 DIAGNOSIS — I739 Peripheral vascular disease, unspecified: Secondary | ICD-10-CM | POA: Diagnosis not present

## 2024-08-28 DIAGNOSIS — F0390 Unspecified dementia without behavioral disturbance: Secondary | ICD-10-CM

## 2024-08-28 DIAGNOSIS — E785 Hyperlipidemia, unspecified: Secondary | ICD-10-CM | POA: Diagnosis not present

## 2024-08-28 LAB — COMPREHENSIVE METABOLIC PANEL WITH GFR
ALT: 21 U/L (ref 0–53)
AST: 18 U/L (ref 0–37)
Albumin: 4.2 g/dL (ref 3.5–5.2)
Alkaline Phosphatase: 94 U/L (ref 39–117)
BUN: 17 mg/dL (ref 6–23)
CO2: 35 meq/L — ABNORMAL HIGH (ref 19–32)
Calcium: 9.2 mg/dL (ref 8.4–10.5)
Chloride: 98 meq/L (ref 96–112)
Creatinine, Ser: 0.76 mg/dL (ref 0.40–1.50)
GFR: 86.99 mL/min (ref >60.00)
Glucose, Bld: 104 mg/dL — ABNORMAL HIGH (ref 70–99)
Potassium: 4.2 meq/L (ref 3.5–5.1)
Sodium: 141 meq/L (ref 135–145)
Total Bilirubin: 0.6 mg/dL (ref 0.2–1.2)
Total Protein: 6.6 g/dL (ref 6.0–8.3)

## 2024-08-28 LAB — CBC WITH DIFFERENTIAL/PLATELET
Basophils Absolute: 0 K/uL (ref 0.0–0.1)
Basophils Relative: 0.5 % (ref 0.0–3.0)
Eosinophils Absolute: 0.3 K/uL (ref 0.0–0.7)
Eosinophils Relative: 4 % (ref 0.0–5.0)
HCT: 41 % (ref 39.0–52.0)
Hemoglobin: 13.3 g/dL (ref 13.0–17.0)
Lymphocytes Relative: 22.6 % (ref 12.0–46.0)
Lymphs Abs: 1.5 K/uL (ref 0.7–4.0)
MCHC: 32.3 g/dL (ref 30.0–36.0)
MCV: 96.1 fl (ref 78.0–100.0)
Monocytes Absolute: 0.5 K/uL (ref 0.1–1.0)
Monocytes Relative: 8.1 % (ref 3.0–12.0)
Neutro Abs: 4.2 K/uL (ref 1.4–7.7)
Neutrophils Relative %: 64.8 % (ref 43.0–77.0)
Platelets: 195 K/uL (ref 150.0–400.0)
RBC: 4.27 Mil/uL (ref 4.22–5.81)
RDW: 13.8 % (ref 11.5–15.5)
WBC: 6.4 K/uL (ref 4.0–10.5)

## 2024-08-28 LAB — LIPID PANEL
Cholesterol: 121 mg/dL (ref 0–200)
HDL: 32.8 mg/dL — ABNORMAL LOW (ref >39.00)
LDL Cholesterol: 64 mg/dL (ref 0–99)
NonHDL: 88.16
Total CHOL/HDL Ratio: 4
Triglycerides: 119 mg/dL (ref 0.0–149.0)
VLDL: 23.8 mg/dL (ref 0.0–40.0)

## 2024-08-28 LAB — TSH: TSH: 3.81 u[IU]/mL (ref 0.35–5.50)

## 2024-08-28 LAB — HEMOGLOBIN A1C: Hgb A1c MFr Bld: 6.6 % — ABNORMAL HIGH (ref 4.6–6.5)

## 2024-08-28 MED ORDER — BUSPIRONE HCL 5 MG PO TABS: 5.0000 mg | ORAL_TABLET | Freq: Two times a day (BID) | ORAL | 1 refills | Status: DC

## 2024-08-28 NOTE — Assessment & Plan Note (Signed)
 Encouraged DASH or MIND diet, decrease po intake and increase exercise as tolerated. Needs 7-8 hours of sleep nightly. Avoid trans fats, eat small, frequent meals every 4-5 hours with lean proteins, complex carbs and healthy fats. Minimize simple carbs, high fat foods and processed foods

## 2024-08-28 NOTE — Patient Instructions (Signed)
  [facility] Providers: [facility] located at [address], [place], [place].  [phone].    [facility])- [address] [place], [place] [address] - [phone]  [facility] located at [address], [place], [place]. [phone].  [facility] located at [address] [place], [place].  [phone].  [facility] located at [address] [place], [place], [place].  [phone].  [facility] located at [address], [place], [place].  [phone].    [facility] located at [address] [place], [place], [place].  [phone].    [facility] - 4 local practices located at: [address] [place], [place].  [phone]. [address] [place], [place], [place].  [phone]. [address], [place], [place].  [phone]. [address], [place], [place]. [phone].  [facility] located at [address], [place], [place]. [phone].    [facility] located at [address] [place], [place] [address]- [phone]   [facility] located at [address] [place], [place] [address] [phone]    [facility] located at [address] [place], [place], [place] [address] = [phone]   [facility] located at [address] [place], [place] [address] = [phone]   [facility] [address] [place], [place] [address] - [phone]  [facility] [address] [place], [place], [place] [address]- [phone]   [facility] [address] [place], [place] [address]- [phone]  [facility] [address] [place], [place] -[phone]  [facility]- [address], [place], [place] [address]- [phone]  [facility]- [address] [place], [place], [place]- [phone] (several Locations)  [facility] - [address] [place]ie [place], [place] [address]- [phone]   [facility]- [address] [place], [place], [place] [phone]  [facility]- [address], [place], [place] [address] [phone]    [facility]- [address], [place], [place] [address]- [phone]  [facility]- [address] [place]ana Hoyle [place], [place] [address]- [phone]  [facility] [address], [place], [place] [address]- [phone]. [facility], [facility] and [place] [phone] [facility] [address] [place], [place] [phone] [facility]- [address] [place], [place], [place] - [phone]  [facility]- [address] [place], [place], [place] [address]- [phone]  [facility]- [address], [place] -  [phone]  [facility] - [address], [place]- [phone]

## 2024-08-31 ENCOUNTER — Ambulatory Visit: Admitting: Podiatry

## 2024-08-31 ENCOUNTER — Encounter: Payer: Self-pay | Admitting: Podiatry

## 2024-08-31 VITALS — Ht 69.0 in | Wt 255.0 lb

## 2024-08-31 DIAGNOSIS — L97529 Non-pressure chronic ulcer of other part of left foot with unspecified severity: Secondary | ICD-10-CM | POA: Diagnosis not present

## 2024-08-31 DIAGNOSIS — E11621 Type 2 diabetes mellitus with foot ulcer: Secondary | ICD-10-CM | POA: Diagnosis not present

## 2024-08-31 DIAGNOSIS — Z89432 Acquired absence of left foot: Secondary | ICD-10-CM

## 2024-08-31 NOTE — Progress Notes (Unsigned)
 Subjective: Chief Complaint  Patient presents with   Foot Ulcer    Patient is here for Type 2 diabetes mellitus with left diabetic foot ulcer (HCC)      78 year old male presents the office today for follow-up evaluation status post left transmetatarsal amputation.  He is back to wearing a shoe but still waiting his new diabetic insoles with toe filler for the left side.  He still continues to work with home physical therapy which has been beneficial.  He does not report any recent injuries or falls.  No open lesions.  DOS: 05/20/2024  Objective: AAO x3, NAD DP/PT pulses palpable bilaterally, CRT less than 3 seconds Sensation decreased. Status post left TMA incision is well-healed at this time and scar is formed.  Dry skin is present there is no skin breakdown or warmth. There are no open lesions/ulcerations noted at this time.  There is no erythema or warmth or any signs of infection. No pain with calf compression, swelling, warmth, erythema          Assessment: Status post left TMA  Plan: -All treatment options discussed with the patient including all alternatives, risks, complications.  -Still awaiting his new insoles.  Discussed that once he is back to wear shoe we can return to work although I would like for him to sit mostly while working. He states that he able to do this and his job does not require a lot of walking. -Moisturizers along the incision -Elevation -Will continue PT ([name])  No follow-ups on file.  [name] DPM

## 2024-09-01 ENCOUNTER — Encounter: Payer: Self-pay | Admitting: Family Medicine

## 2024-09-01 ENCOUNTER — Other Ambulatory Visit: Payer: Self-pay | Admitting: Family Medicine

## 2024-09-04 ENCOUNTER — Encounter: Payer: Self-pay | Admitting: Lab

## 2024-09-09 ENCOUNTER — Telehealth: Payer: Self-pay

## 2024-09-10 NOTE — Telephone Encounter (Signed)
 Spoke w patient and advised that shoes are on B/O until 11/8  We will call when in

## 2024-09-15 ENCOUNTER — Other Ambulatory Visit: Payer: Self-pay | Admitting: Family Medicine

## 2024-09-15 ENCOUNTER — Telehealth: Payer: Self-pay

## 2024-09-15 ENCOUNTER — Ambulatory Visit: Payer: Medicare HMO

## 2024-09-15 VITALS — BP 122/62 | HR 61 | Temp 97.9°F | Ht 69.0 in | Wt 260.4 lb

## 2024-09-15 DIAGNOSIS — Z0000 Encounter for general adult medical examination without abnormal findings: Secondary | ICD-10-CM

## 2024-09-15 DIAGNOSIS — E1169 Type 2 diabetes mellitus with other specified complication: Secondary | ICD-10-CM

## 2024-09-15 NOTE — Patient Instructions (Addendum)
 [name],  Thank you for taking the time for your Medicare Wellness Visit. I appreciate your continued commitment to your health goals. Please review the care plan we discussed, and feel free to reach out if I can assist you further.  Please note that Annual Wellness Visits do not include a physical exam. Some assessments may be limited, especially if the visit was conducted virtually. If needed, we may recommend an in-person follow-up with your provider.  Ongoing Care Seeing your primary care provider every 3 to 6 months helps us  monitor your health and provide consistent, personalized care.   Referrals If a referral was made during today's visit and you haven't received any updates within two weeks, please contact the referred provider directly to check on the status.  Recommended Screenings:  Health Maintenance  Topic Date Due   Zoster (Shingles) Vaccine (1 of 2) 08/06/2026*   Eye exam for diabetics  09/30/2024   Hemoglobin A1C  02/26/2025   Yearly kidney health urinalysis for diabetes  05/15/2025   Complete foot exam   05/25/2025   Yearly kidney function blood test for diabetes  08/28/2025   Medicare Annual Wellness Visit  09/15/2025   DTaP/Tdap/Td vaccine (2 - Tdap) 07/24/2028   Pneumococcal Vaccine for age over 67  Completed   Flu Shot  Completed   Hepatitis C Screening  Completed   Meningitis B Vaccine  Aged Out   Colon Cancer Screening  Discontinued   COVID-19 Vaccine  Discontinued   Cologuard (Stool DNA test)  Discontinued  *Topic was postponed. The date shown is not the original due date.       09/15/2024   10:53 AM  Advanced Directives  Does Patient Have a Medical Advance Directive? Yes  Type of Estate Agent of [place];Living will  Does patient want to make changes to medical advance directive? No - Patient declined  Copy of Healthcare Power of Attorney in Chart? Yes - validated most recent copy scanned in chart (See row information)     Vision: Annual vision screenings are recommended for early detection of glaucoma, cataracts, and diabetic retinopathy. These exams can also reveal signs of chronic conditions such as diabetes and high blood pressure.  Dental: Annual dental screenings help detect early signs of oral cancer, gum disease, and other conditions linked to overall health, including heart disease and diabetes.  Please see the attached documents for additional preventive care recommendations.

## 2024-09-15 NOTE — Telephone Encounter (Signed)
 Copied from CRM [id number]. Topic: Clinical - Home Health Verbal Orders >> Sep 15, 2024  4:30 PM Roselie BROCKS wrote: Caller/Agency: [facility]  Callback Number: [phone] Service Requested: Physical Therapy Frequency:  Verbal order physical theray 1 week 4  Any new concerns about the patient? No
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
## 2024-09-16 ENCOUNTER — Encounter: Payer: Self-pay | Admitting: Student

## 2024-09-16 ENCOUNTER — Ambulatory Visit: Payer: Self-pay | Admitting: *Deleted

## 2024-09-16 NOTE — Telephone Encounter (Signed)
 Spoke with pt's wife and notified her of provider recommendations.  She voiced understanding.

## 2024-09-16 NOTE — Telephone Encounter (Signed)
 FYI Only or Action Required?: Action required by provider: clinical question for provider.  Patient was last seen in primary care on 08/28/2024 by Wheeler Harlene CROME, NP.  Called Nurse Triage reporting No chief complaint on file..  Symptoms began several weeks ago.  Interventions attempted: Nothing.  Symptoms are: unchanged.  Triage Disposition: See Physician Within 24 Hours  Patient/caregiver understands and will follow disposition?: No, wishes to speak with PCP  Patient's wife calling with concerns about decreased BP and low pulse readings. Patient states he has no symptoms. Declines appointment -but does want PCP to be aware and give advice for this.   Copied from CRM [id number]. Topic: Clinical - Red Word Triage >> Sep 16, 2024 11:55 AM [name] wrote: Red Word that prompted transfer to Nurse Triage: Patient spouse called to advised his blood pressure is dropping again. Reading this morning about :  113/48. Reason for Disposition  [1] Systolic BP 90-110 AND [2] taking blood pressure medications AND [3] NOT feeling weak or lightheaded  Answer Assessment - Initial Assessment Questions 1. BLOOD PRESSURE: What is your blood pressure? Did you take at least two measurements 5 minutes apart?     896/51,886/51 2. ONSET: When did you take your blood pressure?     today 3. HOW: How did you take your blood pressure? (e.g., visiting nurse, automatic home BP monitor)     Automatic cuff- arm 4. HISTORY: Do you have a history of low blood pressure? What is your blood pressure normally?     3 weeks ago went to ED for this same problem, 120/60-normal range 5. MEDICINES: Are you taking any medicines for blood pressure? If Yes, ask: Have they been changed recently?     Yes- no changes 6. PULSE RATE: Do you know what your pulse rate is?      P-57 7. OTHER SYMPTOMS: Have you been sick recently? Have you had a recent injury?     no  Protocols used: Blood Pressure -  Low-A-AH

## 2024-09-16 NOTE — Telephone Encounter (Signed)
VO given on secure voicemail

## 2024-09-17 ENCOUNTER — Ambulatory Visit (INDEPENDENT_AMBULATORY_CARE_PROVIDER_SITE_OTHER): Admitting: Internal Medicine

## 2024-09-17 ENCOUNTER — Encounter: Payer: Self-pay | Admitting: Internal Medicine

## 2024-09-17 VITALS — BP 129/76 | HR 66 | Temp 98.0°F | Ht 69.0 in | Wt 263.0 lb

## 2024-09-17 DIAGNOSIS — J9612 Chronic respiratory failure with hypercapnia: Secondary | ICD-10-CM

## 2024-09-17 DIAGNOSIS — J9611 Chronic respiratory failure with hypoxia: Secondary | ICD-10-CM | POA: Diagnosis not present

## 2024-09-17 DIAGNOSIS — J4489 Other specified chronic obstructive pulmonary disease: Secondary | ICD-10-CM | POA: Diagnosis not present
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
## 2024-09-17 NOTE — Patient Instructions (Signed)
 It was a pleasure to see you today!  Please schedule follow up with Dr. Pleas in 3 months. Please call sooner [phone] if issues or concerns arise. You can also send us  a message through MyChart, but but aware that this is not to be used for urgent issues and it may take up to 5-7 days to receive a reply. Please be aware that you will likely be able to view your results before I have a chance to respond to them. Please give us  5 business days to respond to any non-urgent results.    Sorry your breathing is doing a little worse  Start taking the budesonide  nebulizer treatment once daily.  Also use the afomoterol nebulizer treatment once daily.   Take the albuterol  rescue inhaler or nebulizer treatment every 4 to 6 hours as needed for wheezing or shortness of breath. You can also take it 15 minutes before exercise or exertional activity.  Please wear oxygen  for goal saturations over 88%.   I am referring you to pulmonary rehab which is an exercise program for patients with lung disease.

## 2024-09-18 ENCOUNTER — Encounter: Payer: Self-pay | Admitting: Physician Assistant

## 2024-09-18 ENCOUNTER — Ambulatory Visit (INDEPENDENT_AMBULATORY_CARE_PROVIDER_SITE_OTHER): Payer: Medicare HMO | Admitting: Physician Assistant

## 2024-09-18 VITALS — BP 114/67 | Resp 20 | Ht 69.0 in

## 2024-09-18 DIAGNOSIS — R413 Other amnesia: Secondary | ICD-10-CM

## 2024-09-18 NOTE — Patient Instructions (Addendum)
 It was a pleasure to see you today at our office.   Recommendations:  Follow up in 6 months  Continue B12 supplements Repeat neurocognitive testing in 6 months  Continue memantine  5 mg 2 times a day  Continue PT       RECOMMENDATIONS FOR ALL PATIENTS WITH MEMORY PROBLEMS: 1. Continue to exercise (Recommend 30 minutes of walking everyday, or 3 hours every week) 2. Increase social interactions - continue going to [place] and enjoy social gatherings with friends and family 3. Eat healthy, avoid fried foods and eat more fruits and vegetables 4. Maintain adequate blood pressure, blood sugar, and blood cholesterol level. Reducing the risk of stroke and cardiovascular disease also helps promoting better memory. 5. Avoid stressful situations. Live a simple life and avoid aggravations. Organize your time and prepare for the next day in anticipation. 6. Sleep well, avoid any interruptions of sleep and avoid any distractions in the bedroom that may interfere with adequate sleep quality 7. Avoid sugar, avoid sweets as there is a strong link between excessive sugar intake, diabetes, and cognitive impairment We discussed the Mediterranean diet, which has been shown to help patients reduce the risk of progressive memory disorders and reduces cardiovascular risk. This includes eating fish, eat fruits and green leafy vegetables, nuts like almonds and hazelnuts, walnuts, and also use olive oil. Avoid fast foods and fried foods as much as possible. Avoid sweets and sugar as sugar use has been linked to worsening of memory function.  There is always a concern of gradual progression of memory problems. If this is the case, then we may need to adjust level of care according to patient needs. Support, both to the patient and caregiver, should then be put into place.    FALL PRECAUTIONS: Be cautious when walking. Scan the area for obstacles that may increase the risk of trips and falls. When getting up in the  mornings, sit up at the edge of the bed for a few minutes before getting out of bed. Consider elevating the bed at the head end to avoid drop of blood pressure when getting up. Walk always in a well-lit room (use night lights in the walls). Avoid area rugs or power cords from appliances in the middle of the walkways. Use a walker or a cane if necessary and consider physical therapy for balance exercise. Get your eyesight checked regularly.  FINANCIAL OVERSIGHT: Supervision, especially oversight when making financial decisions or transactions is also recommended.  HOME SAFETY: Consider the safety of the kitchen when operating appliances like stoves, microwave oven, and blender. Consider having supervision and share cooking responsibilities until no longer able to participate in those. Accidents with firearms and other hazards in the house should be identified and addressed as well.   ABILITY TO BE LEFT ALONE: If patient is unable to contact 911 operator, consider using LifeLine, or when the need is there, arrange for someone to stay with patients. Smoking is a fire hazard, consider supervision or cessation. Risk of wandering should be assessed by caregiver and if detected at any point, supervision and safe proof recommendations should be instituted.  MEDICATION SUPERVISION: Inability to self-administer medication needs to be constantly addressed. Implement a mechanism to ensure safe administration of the medications.   DRIVING: Regarding driving, in patients with progressive memory problems, driving will be impaired. We advise to have someone else do the driving if trouble finding directions or if minor accidents are reported. Independent driving assessment is available to determine safety of driving.  If you are interested in the driving assessment, you can contact the following:  [employer] in [place] [phone]  [facility] [phone]  [facility] [phone] or [phone]   We have sent a referral to [facility] for your MRI and they will call you directly to schedule your appointment. They are located at [address] [facility]. If you need to contact them directly please call [phone].   Your provider has requested that you have labwork completed today. Please go to [facility] ([id number]) on the second floor of this building before leaving the office today. You do not need to check in. If you are not called within 15 minutes please check with the front desk.

## 2024-09-22 ENCOUNTER — Other Ambulatory Visit: Payer: Self-pay | Admitting: Student

## 2024-09-22 DIAGNOSIS — F419 Anxiety disorder, unspecified: Secondary | ICD-10-CM

## 2024-09-24 ENCOUNTER — Telehealth: Payer: Self-pay

## 2024-09-24 ENCOUNTER — Other Ambulatory Visit

## 2024-09-24 NOTE — Telephone Encounter (Signed)
 LM for patient 50month date on [employer] ppw has expired we cannot dispense shoes until this date is re-date for current 6 month visit with DTD (Diabetic treating dr)  I left message for patient stating this also that he can still come in to try on shoes but we cannot give them to him until ppw is valid at that time he can just pick up   Lolita Schultze Cped,

## 2024-09-25 ENCOUNTER — Ambulatory Visit: Admitting: Podiatry

## 2024-09-25 ENCOUNTER — Telehealth (HOSPITAL_COMMUNITY): Payer: Self-pay

## 2024-09-25 DIAGNOSIS — Z89432 Acquired absence of left foot: Secondary | ICD-10-CM | POA: Diagnosis not present

## 2024-09-25 DIAGNOSIS — R2681 Unsteadiness on feet: Secondary | ICD-10-CM | POA: Diagnosis not present

## 2024-09-25 DIAGNOSIS — L97529 Non-pressure chronic ulcer of other part of left foot with unspecified severity: Secondary | ICD-10-CM | POA: Diagnosis not present

## 2024-09-25 DIAGNOSIS — E11621 Type 2 diabetes mellitus with foot ulcer: Secondary | ICD-10-CM | POA: Diagnosis not present

## 2024-09-25 NOTE — Telephone Encounter (Signed)
 Pt insurance is active and benefits verified through [facility]. Co-pay $15, DED $0/$0 met, out of pocket $8,900/$2,900.34 met, co-insurance 0%. No pre-authorization required. 09/25/2024 @ 10:00am, spoke with [name], REF# [id number].

## 2024-09-25 NOTE — Progress Notes (Signed)
 Subjective: Chief Complaint  Patient presents with   Follow-up    Lolita for TMA filler/addition to insert; me in 3 weeks. Leftv foot tranmet amputation.     78 year old male presents the office today for follow-up evaluation status post left transmetatarsal amputation.  States he is doing well.  No open lesions or any pain.  He still doing the home physical therapy.  He presents today to also pick up his diabetic shoes.     DOS: 05/20/2024  Lab Results  Component Value Date   HGBA1C 6.6 (H) 08/28/2024   HGBA1C 7.0 (H) 05/15/2024   HGBA1C 6.7 (H) 01/28/2024     Objective: AAO x3, NAD DP/PT pulses palpable bilaterally, CRT less than 3 seconds Sensation decreased. Status post left TMA incision is well-healed at this time and scar has formed.  Dry skin is present there is no skin breakdown or warmth. There are no open lesions/ulcerations noted at this time.  There is no erythema or warmth or any signs of infection. No pain with calf compression, swelling, warmth, erythema  Assessment: Status post left TMA- healed  Plan: -All treatment options discussed with the patient including all alternatives, risks, complications.  -Incision is well-healed there is no ulcerations.  He is back to wearing his diabetic shoe that is moderate insert.  He was seen today by Lolita, pedorthist for his new shoes, inserts.  Discussed that he is able to back to work (from my standpoint) although discussed trying to sit is much as possible. -Continue moisturizer -Elevation  No follow-ups on file.  [name] DPM

## 2024-09-25 NOTE — Telephone Encounter (Signed)
 LVM to schedule for [facility]

## 2024-09-28 ENCOUNTER — Telehealth (HOSPITAL_COMMUNITY): Payer: Self-pay

## 2024-09-28 NOTE — Telephone Encounter (Signed)
 PR Orientation

## 2024-09-28 NOTE — Telephone Encounter (Signed)
 [name] returned my call. He is interested in Pulmonary Rehab. [name] will come in for orientation on 10/12/24 at 10:30 am and will exercise in the 10:15 class.

## 2024-09-28 NOTE — Telephone Encounter (Signed)
 Received message that [facility] called back. Called pt for scheduling. No answer. LVM with office number and department number.

## 2024-09-29 ENCOUNTER — Encounter: Payer: Self-pay | Admitting: Podiatry

## 2024-09-29 LAB — OPHTHALMOLOGY REPORT-SCANNED

## 2024-10-08 ENCOUNTER — Other Ambulatory Visit: Payer: Self-pay | Admitting: Family Medicine
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
## 2024-10-08 NOTE — Assessment & Plan Note (Addendum)
 He is on memantine  5 mg twice daily, tolerating well. Follows with Neurology. Continues to drive, and maintaining ADLs. He has family support at home.

## 2024-10-08 NOTE — Assessment & Plan Note (Signed)
 Follows with cardiology.  On statin and ASA

## 2024-10-08 NOTE — Assessment & Plan Note (Signed)
 Abdominal aortic aneurysm-noted to be 38 mm on recent check. Follows with Dr. Gretta

## 2024-10-08 NOTE — Assessment & Plan Note (Addendum)
 Following with Pulmonology. Starting  pulmonary rehab excersize program. Uses home O2 as needed.

## 2024-10-09 ENCOUNTER — Telehealth (HOSPITAL_COMMUNITY): Payer: Self-pay

## 2024-10-09 ENCOUNTER — Ambulatory Visit: Admitting: Student

## 2024-10-09 ENCOUNTER — Encounter: Payer: Self-pay | Admitting: Student

## 2024-10-09 DIAGNOSIS — I10 Essential (primary) hypertension: Secondary | ICD-10-CM

## 2024-10-09 DIAGNOSIS — E039 Hypothyroidism, unspecified: Secondary | ICD-10-CM

## 2024-10-09 DIAGNOSIS — E1149 Type 2 diabetes mellitus with other diabetic neurological complication: Secondary | ICD-10-CM

## 2024-10-09 DIAGNOSIS — I7143 Infrarenal abdominal aortic aneurysm, without rupture: Secondary | ICD-10-CM

## 2024-10-09 DIAGNOSIS — E782 Mixed hyperlipidemia: Secondary | ICD-10-CM

## 2024-10-09 DIAGNOSIS — J9611 Chronic respiratory failure with hypoxia: Secondary | ICD-10-CM

## 2024-10-09 DIAGNOSIS — F0390 Unspecified dementia without behavioral disturbance: Secondary | ICD-10-CM

## 2024-10-09 DIAGNOSIS — F419 Anxiety disorder, unspecified: Secondary | ICD-10-CM

## 2024-10-09 DIAGNOSIS — I2510 Atherosclerotic heart disease of native coronary artery without angina pectoris: Secondary | ICD-10-CM

## 2024-10-09 NOTE — Telephone Encounter (Signed)
 Requesting: Lyrica  150mg   Contract:11/19/23 UDS: 11/19/23 Last Visit: 08/28/24 w/ Harlene GRADE Next Visit: 11/20/24 w/ Harlene GRADE Last Refill: 03/03/24 #180 and 1RF   Please Advise

## 2024-10-09 NOTE — Telephone Encounter (Signed)
 Called to confirm appt. Pt confirmed appt. Instructed pt on proper footwear. Gave directions along with department number.

## 2024-10-12 ENCOUNTER — Encounter (HOSPITAL_COMMUNITY)
Admission: RE | Admit: 2024-10-12 | Discharge: 2024-10-12 | Disposition: A | Discharge status: Home / Self Care | Source: Ambulatory Visit | Attending: Internal Medicine | Admitting: Internal Medicine

## 2024-10-12 ENCOUNTER — Encounter (HOSPITAL_COMMUNITY): Payer: Self-pay

## 2024-10-12 VITALS — BP 106/50 | HR 66 | Wt 258.4 lb

## 2024-10-12 DIAGNOSIS — J449 Chronic obstructive pulmonary disease, unspecified: Secondary | ICD-10-CM | POA: Insufficient documentation

## 2024-10-12 NOTE — Progress Notes (Signed)
 Pulmonary Individual Treatment Plan  Patient Details  Name: [name] MRN: [id number] Date of Birth: [birth year] Referring Provider:   Conrad Ports Pulmonary Rehab Walk Test from 10/12/2024 in [facility]  Referring Provider Meade    Initial Encounter Date:  Flowsheet Row Pulmonary Rehab Walk Test from 10/12/2024 in [facility] for Heart, Vascular, [facility] Lung Health  Date 10/12/24    Visit Diagnosis: Stage 3 severe COPD by GOLD classification (HCC)  Patient's Home Medications on Admission:  Current Outpatient Medications:    albuterol  (PROVENTIL ) (2.5 MG/3ML) 0.083% nebulizer solution, Take 3 mLs (2.5 mg total) by nebulization every 6 (six) hours as needed for wheezing or shortness of breath., Disp: 150 mL, Rfl: 1   albuterol  (VENTOLIN  HFA) 108 (90 Base) MCG/ACT inhaler, Inhale 2 puffs into the lungs every 6 (six) hours as needed., Disp: 18 g, Rfl: 3   ALPRAZolam  (XANAX ) 0.5 MG tablet, Take 1 tablet (0.5 mg total) by mouth 2 (two) times daily as needed for anxiety., Disp: 60 tablet, Rfl: 2   arformoterol  (BROVANA ) 15 MCG/2ML NEBU, Substituted for: Brovana  Neb Solution Inhale one vial via nebulizer once daily., Disp: 2 mL, Rfl: 10   Ascorbic Acid  (VITAMIN C ) 1000 MG tablet, Take 1,000 mg by mouth daily., Disp: , Rfl:    aspirin  EC 81 MG tablet, Take 81 mg by mouth at bedtime., Disp: , Rfl:    atorvastatin  (LIPITOR ) 80 MG tablet, TAKE 1 TABLET BY MOUTH EVERY DAY, Disp: 90 tablet, Rfl: 1   BD PEN NEEDLE NANO 2ND GEN 32G X 4 MM MISC, USE AS DIRECTED WITH HUMALOG  PEN, Disp: , Rfl:    Biotin 1000 MCG tablet, Take 1,000 mg by mouth daily., Disp: , Rfl:    Blood Glucose Monitoring Suppl (ACCU-CHEK AVIVA PLUS) w/Device KIT, Check blood sugars three times daily, Disp: 1 kit, Rfl: 0   budesonide  (PULMICORT ) 0.25 MG/2ML nebulizer solution, Take 2 mLs (0.25 mg total) by nebulization daily., Disp: 120 mL, Rfl: 12    busPIRone  (BUSPAR ) 5 MG tablet, Take 1 tablet (5 mg total) by mouth 2 (two) times daily., Disp: 180 tablet, Rfl: 0   carvedilol  (COREG ) 25 MG tablet, TAKE ONE-HALF TABLET BY MOUTH TWICE DAILY WITH MEALS, Disp: 90 tablet, Rfl: 3   cholecalciferol  (VITAMIN D ) 1000 UNITS tablet, Take 1,000 Units by mouth daily., Disp: , Rfl:    clopidogrel  (PLAVIX ) 75 MG tablet, TAKE 1 TABLET BY MOUTH EVERY DAY, Disp: 90 tablet, Rfl: 1   dorzolamide -timolol  (COSOPT ) 2-0.5 % ophthalmic solution, Place 1 drop into both eyes daily., Disp: , Rfl:    ezetimibe  (ZETIA ) 10 MG tablet, TAKE 1 TABLET BY MOUTH EVERY DAY, Disp: 90 tablet, Rfl: 3   FLUoxetine  (PROZAC ) 40 MG capsule, TAKE 1 CAPSULE (40 MG TOTAL) BY MOUTH DAILY., Disp: 90 capsule, Rfl: 1   glimepiride  (AMARYL ) 2 MG tablet, TAKE 1 TABLET BY MOUTH 2 TIMES DAILY., Disp: 180 tablet, Rfl: 3   glucose blood (ACCU-CHEK GUIDE TEST) test strip, Check blood sugars 3 times daily, Disp: 300 each, Rfl: 12   HYDROcodone -acetaminophen  (NORCO) 7.5-325 MG tablet, Take 1-2 tablets by mouth every 12 (twelve) hours as needed for severe pain (pain score 7-10) or moderate pain (pain score 4-6)., Disp: 20 tablet, Rfl: 0   insulin  lispro (HUMALOG  KWIKPEN) 100 UNIT/ML KwikPen, Before each meal 3 times a day, 140-199 - 3 units, 200-250 - 6 units, 251-299 - 8 units,  300-349 -  10 units,  350 or above 12 units. Insulin  PEN if approved, provide syringes and needles if needed., Disp: 15 mL, Rfl: 3   Lancets (ACCU-CHEK MULTICLIX) lancets, Check blood sugars three times daily, Disp: 300 each, Rfl: 12   Latanoprost  0.005 % EMUL, Place 1 drop into both eyes in the morning and at bedtime., Disp: , Rfl:    levothyroxine  (SYNTHROID ) 25 MCG tablet, TAKE 1 TABLET BY MOUTH DAILY BEFORE BREAKFAST., Disp: 90 tablet, Rfl: 1   losartan  (COZAAR ) 50 MG tablet, TAKE 1 TABLET BY MOUTH EVERY DAY, Disp: 90 tablet, Rfl: 1   Magnesium  500 MG TABS, Take 500 mg by mouth daily., Disp: , Rfl:    memantine  (NAMENDA ) 5 MG  tablet, Take 1 tablet (5 mg total) by mouth 2 (two) times daily., Disp: 180 tablet, Rfl: 3   Multiple Vitamin (MULTIVITAMIN) tablet, Take 1 tablet by mouth daily., Disp: , Rfl:    Omega-3 Fatty Acids (FISH OIL) 1000 MG CAPS, Take 1,000 mg by mouth 2 (two) times daily., Disp: , Rfl:    pioglitazone  (ACTOS ) 30 MG tablet, Take 1 tablet (30 mg total) by mouth daily., Disp: 90 tablet, Rfl: 1   prednisoLONE  acetate (PRED FORTE ) 1 % ophthalmic suspension, Place 1 drop into the right eye 2 (two) times daily., Disp: , Rfl:    pregabalin  (LYRICA ) 150 MG capsule, TAKE 1 CAPSULE BY MOUTH TWICE A DAY, Disp: 60 capsule, Rfl: 1   vitamin B-12 (CYANOCOBALAMIN) 1000 MCG tablet, Take 1,000 mcg by mouth See admin instructions. Take one tablet by mouth twice weekly on Monday and Thursday per wife, Disp: , Rfl:    zinc  gluconate 50 MG tablet, Take 50 mg by mouth daily., Disp: , Rfl:   Past Medical History: Past Medical History:  Diagnosis Date   AAA (abdominal aortic aneurysm) without rupture 2008   Stable AAA max diameter 4.1cm but likely 3.5x3.7cm, rpt 1 yr (09/2015)   Allergic rhinitis    Anemia 08/14/2013   Aneurysm, cerebral, nonruptured 03/02/2014   Arthritis    left ankle; back LLE; right wrist  (11/10/2012)   Asthma    Back pain 03/17/2017   Carotid artery disease 05/04/2022   Cellulitis and abscess of toe of left foot 05/26/2019   Class 1 obesity with serious comorbidity and body mass index (BMI) of 32.0 to 32.9 in adult 12/16/2018   COPD (chronic obstructive pulmonary disease) 02/17/2015   Coronary atherosclerosis 10/17/2007   Decreased hearing    Diabetes mellitus type 2 with neurological manifestations 04/21/2007   Sees Podiatry  Sees [facility] for eye exam, 05/06/13   Diabetic foot ulcer 05/30/2020   Diabetic peripheral vascular disease    Dyspnea 03/06/2022   Dysrhythmia    skips beats at times   Generalized anxiety disorder 06/05/2022   GERD 11/16/2009   Glaucoma  10/17/2007   Gout 12/06/2016   Hereditary and idiopathic peripheral neuropathy 10/17/2014   History of COVID-19 12/04/2019   Hyperlipidemia associated with type 2 diabetes mellitus 06/13/2020   Hypertension    Hypothyroidism 05/20/2021   Hypoxemia 09/07/2021   Iliac artery stenosis, left 10/19/2018   Kidney stone    passed them on my own 3 times (11/10/2012)   Knee pain, left 12/12/2009   Major depressive disorder 05/25/2018   Mild obstructive sleep apnea 03/06/2022   With hypoxia, no CPAP   Neck pain 12/27/2020   Osteoarthritis 06/05/2022   Pneumonia 2011   Prolonged QT interval 09/07/2021   Renal artery stenosis 10/19/2018  Renal insufficiency 08/14/2013   Right hip pain    Right otitis media 05/30/2020   SCCA (squamous cell carcinoma) of skin 07/28/2018   Left Hand Dorsum (well diff) (curet and 5FU)   Stroke 05/09/2005   Small chronic cortical and white matter infarcts within the right ACA/MCA and right ACA/PCA watershed territories   Synovial cyst of lumbar facet joint 04/29/2017   Tinnitus     Tobacco Use: Social History   Tobacco Use  Smoking Status Former   Current packs/day: 0.00   Average packs/day: 2.0 packs/day for 40.0 years (80.0 ttl pk-yrs)   Types: Cigarettes, Cigars   Start date: 05/04/1966   Quit date: 05/04/2006   Years since quitting: 18.4  Smokeless Tobacco Never    Labs: Review Flowsheet  More data exists      Latest Ref Rng & Units 11/19/2023 01/28/2024 04/15/2024 05/15/2024 08/28/2024  Labs for ITP Cardiac and Pulmonary Rehab  Cholestrol 0 - 200 mg/dL - - - 873  878   LDL (calc) 0 - 99 mg/dL - - - 68  64   HDL-C >60.99 mg/dL - - - 65.79  67.19   Trlycerides 0.0 - 149.0 mg/dL - - - 877.9  880.9   Hemoglobin A1c 4.6 - 6.5 % 7.0  6.7  - 7.0  6.6   TCO2 22 - 32 mmol/L - - 27  - -     Pulmonary Assessment Scores:  Pulmonary Assessment Scores     Row Name 10/12/24 1039         ADL UCSD   ADL Phase Entry     SOB Score total 94        CAT Score   CAT Score 20       mMRC Score   mMRC Score 4        UCSD: Self-administered rating of dyspnea associated with activities of daily living (ADLs) 6-point scale (0 = not at all to 5 = maximal or unable to do because of breathlessness)  Scoring Scores range from 0 to 120.  Minimally important difference is 5 units  CAT: CAT can identify the health impairment of COPD patients and is better correlated with disease progression.  CAT has a scoring range of zero to 40. The CAT score is classified into four groups of low (less than 10), medium (10 - 20), high (21-30) and very high (31-40) based on the impact level of disease on health status. A CAT score over 10 suggests significant symptoms.  A worsening CAT score could be explained by an exacerbation, poor medication adherence, poor inhaler technique, or progression of COPD or comorbid conditions.  CAT MCID is 2 points  mMRC: mMRC (Modified Medical Research Council) Dyspnea Scale is used to assess the degree of baseline functional disability in patients of respiratory disease due to dyspnea. No minimal important difference is established. A decrease in score of 1 point or greater is considered a positive change.   Pulmonary Function Assessment:  Pulmonary Function Assessment - 10/12/24 1226       Breath   Bilateral Breath Sounds Decreased    Shortness of Breath Yes;Limiting activity          Exercise Target Goals: Exercise Program Goal: Individual exercise prescription set using results from initial 6 min walk test and THRR while considering  patient's activity barriers and safety.   Exercise Prescription Goal: Initial exercise prescription builds to 30-45 minutes a day of aerobic activity, 2-3 days per week.  Home exercise guidelines  will be given to patient during program as part of exercise prescription that the participant will acknowledge.  Education: Aerobic Exercise: - Group verbal and visual presentation on  the components of exercise prescription. Introduces F.I.T.T principle from ACSM for exercise prescriptions.  Reviews F.I.T.T. principles of aerobic exercise including progression. Written material provided at class time.   Education: Resistance Exercise: - Group verbal and visual presentation on the components of exercise prescription. Introduces F.I.T.T principle from ACSM for exercise prescriptions  Reviews F.I.T.T. principles of resistance exercise including progression. Written material provided at class time.    Education: Exercise & Equipment Safety: - Individual verbal instruction and demonstration of equipment use and safety with use of the equipment.   Education: Exercise Physiology & General Exercise Guidelines: - Group verbal and written instruction with models to review the exercise physiology of the cardiovascular system and associated critical values. Provides general exercise guidelines with specific guidelines to those with heart or lung disease.    Education: Flexibility, Balance, Mind/Body Relaxation: - Group verbal and visual presentation with interactive activity on the components of exercise prescription. Introduces F.I.T.T principle from ACSM for exercise prescriptions. Reviews F.I.T.T. principles of flexibility and balance exercise training including progression. Also discusses the mind body connection.  Reviews various relaxation techniques to help reduce and manage stress (i.e. Deep breathing, progressive muscle relaxation, and visualization). Balance handout provided to take home. Written material provided at class time.   Activity Barriers & Risk Stratification:  Activity Barriers & Cardiac Risk Stratification - 10/12/24 1040       Activity Barriers & Cardiac Risk Stratification   Activity Barriers Muscular Weakness;Shortness of Breath;Deconditioning;Arthritis;Assistive Device;Balance Concerns    Cardiac Risk Stratification High          6 Minute Walk:  6  Minute Walk     Row Name 10/12/24 1217         6 Minute Walk   Phase Initial     Distance 640 feet     Walk Time 6 minutes     # of Rest Breaks 1  4:00-5:00     MPH 1.21     METS 0.85     RPE 11     Perceived Dyspnea  1     VO2 Peak 2.98     Symptoms Yes (comment)     Comments Used GoCart for stability. Rest 1 min due to hip pain. Only needed 3L but might have needed 4L if hadn't rested     Resting HR 66 bpm     Resting BP 106/50     Resting Oxygen  Saturation  94 %     Exercise Oxygen  Saturation  during 6 min walk 88 %     Max Ex. HR 101 bpm     Max Ex. BP 148/50     2 Minute Post BP 150/60       Interval HR   1 Minute HR 82     2 Minute HR 95     3 Minute HR 98     4 Minute HR 101     5 Minute HR 89     6 Minute HR 94     2 Minute Post HR 78     Interval Heart Rate? Yes       Interval Oxygen    Interval Oxygen ? Yes     Baseline Oxygen  Saturation % 94 %     1 Minute Oxygen  Saturation % 93 %     1 Minute Liters  of Oxygen  2 L     2 Minute Oxygen  Saturation % 89 %     2 Minute Liters of Oxygen  2 L     3 Minute Oxygen  Saturation % 89 %     3 Minute Liters of Oxygen  3 L     4 Minute Oxygen  Saturation % 88 %     4 Minute Liters of Oxygen  3 L     5 Minute Oxygen  Saturation % 91 %  had rested for 1 min     5 Minute Liters of Oxygen  3 L     6 Minute Oxygen  Saturation % 92 %     6 Minute Liters of Oxygen  3 L     2 Minute Post Oxygen  Saturation % 95 %     2 Minute Post Liters of Oxygen  3 L       Oxygen  Initial Assessment:  Oxygen  Initial Assessment - 10/12/24 1038       Home Oxygen    Home Oxygen  Device Home Concentrator;Portable Concentrator;E-Tanks    Sleep Oxygen  Prescription Continuous    Liters per minute 4    Home Exercise Oxygen  Prescription Continuous    Liters per minute 4    Home Resting Oxygen  Prescription Continuous    Liters per minute 4    Compliance with Home Oxygen  Use Yes      Initial 6 min Walk   Oxygen  Used Continuous    Liters per minute 4       Program Oxygen  Prescription   Program Oxygen  Prescription Continuous    Liters per minute 4      Intervention   Short Term Goals To learn and exhibit compliance with exercise, home and travel O2 prescription;To learn and understand importance of maintaining oxygen  saturations>88%;To learn and demonstrate proper use of respiratory medications;To learn and understand importance of monitoring SPO2 with pulse oximeter and demonstrate accurate use of the pulse oximeter.;To learn and demonstrate proper pursed lip breathing techniques or other breathing techniques. ;Other    Long  Term Goals Other;Demonstrates proper use of MDI's;Compliance with respiratory medication;Exhibits proper breathing techniques, such as pursed lip breathing or other method taught during program session;Maintenance of O2 saturations>88%;Verbalizes importance of monitoring SPO2 with pulse oximeter and return demonstration;Exhibits compliance with exercise, home  and travel O2 prescription          Oxygen  Re-Evaluation:   Oxygen  Discharge (Final Oxygen  Re-Evaluation):   Initial Exercise Prescription:  Initial Exercise Prescription - 10/12/24 1200       Date of Initial Exercise RX and Referring Provider   Date 10/12/24    Referring Provider Meade    Expected Discharge Date 01/15/24      Oxygen    Oxygen  Continuous    Maintain Oxygen  Saturation 88% or higher      T5 Nustep   Level 1    SPM 60    Minutes 15    METs 1.5      Track   Laps 7    Minutes 15    METs 1.2      Prescription Details   Frequency (times per week) 2    Duration Progress to 30 minutes of continuous aerobic without signs/symptoms of physical distress      Intensity   THRR 40-80% of Max Heartrate 57-144    Ratings of Perceived Exertion 11-13    Perceived Dyspnea 0-4      Progression   Progression Continue progressive overload as per policy without signs/symptoms or physical distress.  Resistance Training   Training  Prescription Yes    Weight red bands    Reps 10-15          Perform Capillary Blood Glucose checks as needed.  Exercise Prescription Changes:   Exercise Comments:   Exercise Goals and Review:   Exercise Goals     Row Name 10/12/24 1041             Exercise Goals   Increase Physical Activity Yes       Intervention Provide advice, education, support and counseling about physical activity/exercise needs.;Develop an individualized exercise prescription for aerobic and resistive training based on initial evaluation findings, risk stratification, comorbidities and participant's personal goals.       Expected Outcomes Short Term: Attend rehab on a regular basis to increase amount of physical activity.;Long Term: Add in home exercise to make exercise part of routine and to increase amount of physical activity.;Long Term: Exercising regularly at least 3-5 days a week.       Increase Strength and Stamina Yes       Intervention Provide advice, education, support and counseling about physical activity/exercise needs.;Develop an individualized exercise prescription for aerobic and resistive training based on initial evaluation findings, risk stratification, comorbidities and participant's personal goals.       Expected Outcomes Short Term: Increase workloads from initial exercise prescription for resistance, speed, and METs.;Short Term: Perform resistance training exercises routinely during rehab and add in resistance training at home;Long Term: Improve cardiorespiratory fitness, muscular endurance and strength as measured by increased METs and functional capacity ( )       Able to understand and use rate of perceived exertion (RPE) scale Yes       Intervention Provide education and explanation on how to use RPE scale       Expected Outcomes Short Term: Able to use RPE daily in rehab to express subjective intensity level;Long Term:  Able to use RPE to guide intensity level when exercising  independently       Able to understand and use Dyspnea scale Yes       Intervention Provide education and explanation on how to use Dyspnea scale       Expected Outcomes Short Term: Able to use Dyspnea scale daily in rehab to express subjective sense of shortness of breath during exertion;Long Term: Able to use Dyspnea scale to guide intensity level when exercising independently       Knowledge and understanding of Target Heart Rate Range (THRR) Yes       Intervention Provide education and explanation of THRR including how the numbers were predicted and where they are located for reference       Expected Outcomes Short Term: Able to state/look up THRR;Long Term: Able to use THRR to govern intensity when exercising independently;Short Term: Able to use daily as guideline for intensity in rehab       Understanding of Exercise Prescription Yes       Intervention Provide education, explanation, and written materials on patient's individual exercise prescription       Expected Outcomes Short Term: Able to explain program exercise prescription;Long Term: Able to explain home exercise prescription to exercise independently          Exercise Goals Re-Evaluation :   Discharge Exercise Prescription (Final Exercise Prescription Changes):   Nutrition:  Target Goals: Understanding of nutrition guidelines, daily intake of sodium 1500mg , cholesterol 200mg , calories 30% from fat and 7% or less from saturated fats, daily to have  5 or more servings of fruits and vegetables.  Education: Nutrition 1 -Group instruction provided by verbal, written material, interactive activities, discussions, models, and posters to present general guidelines for heart healthy nutrition including macronutrients, label reading, and promoting whole foods over processed counterparts. Education serves as pensions consultant of discussion of heart healthy eating for all. Written material provided at class time.     Education: Nutrition  2 -Group instruction provided by verbal, written material, interactive activities, discussions, models, and posters to present general guidelines for heart healthy nutrition including sodium, cholesterol, and saturated fat. Providing guidance of habit forming to improve blood pressure, cholesterol, and body weight. Written material provided at class time.     Biometrics:    Nutrition Therapy Plan and Nutrition Goals:   Nutrition Assessments:  MEDIFICTS Score Key: >=70 Need to make dietary changes  40-70 Heart Healthy Diet <= 40 Therapeutic Level Cholesterol Diet   Picture Your Plate Scores: <59 Unhealthy dietary pattern with much room for improvement. 41-50 Dietary pattern unlikely to meet recommendations for good health and room for improvement. 51-60 More healthful dietary pattern, with some room for improvement.  >60 Healthy dietary pattern, although there may be some specific behaviors that could be improved.   Nutrition Goals Re-Evaluation:   Nutrition Goals Discharge (Final Nutrition Goals Re-Evaluation):   Psychosocial: Target Goals: Acknowledge presence or absence of significant depression and/or stress, maximize coping skills, provide positive support system. Participant is able to verbalize types and ability to use techniques and skills needed for reducing stress and depression.   Education: Stress, Anxiety, and Depression - Group verbal and visual presentation to define topics covered.  Reviews how body is impacted by stress, anxiety, and depression.  Also discusses healthy ways to reduce stress and to treat/manage anxiety and depression.  Written material provided at class time.   Education: Sleep Hygiene -Provides group verbal and written instruction about how sleep can affect your health.  Define sleep hygiene, discuss sleep cycles and impact of sleep habits. Review good sleep hygiene tips.    Initial Review & Psychosocial Screening:  Initial Psych Review &  Screening - 10/12/24 1032       Initial Review   Current issues with History of Depression;Current Psychotropic Meds      Family Dynamics   Good Support System? Yes    Comments wife [name], 2 kids      Barriers   Psychosocial barriers to participate in program The patient should benefit from training in stress management and relaxation.      Screening Interventions   Interventions Encouraged to exercise    Expected Outcomes Short Term goal: Utilizing psychosocial counselor, staff and physician to assist with identification of specific Stressors or current issues interfering with healing process. Setting desired goal for each stressor or current issue identified.;Long Term Goal: Stressors or current issues are controlled or eliminated.;Short Term goal: Identification and review with participant of any Quality of Life or Depression concerns found by scoring the questionnaire.;Long Term goal: The participant improves quality of Life and PHQ9 Scores as seen by post scores and/or verbalization of changes          Quality of Life Scores:  Scores of 19 and below usually indicate a poorer quality of life in these areas.  A difference of  2-3 points is a clinically meaningful difference.  A difference of 2-3 points in the total score of the Quality of Life Index has been associated with significant improvement in overall quality of life, self-image,  physical symptoms, and general health in studies assessing change in quality of life.  PHQ-9: Review Flowsheet  More data exists      10/12/2024 09/15/2024 08/28/2024 05/15/2024 09/03/2023  Depression screen PHQ 2/9  Decreased Interest 2 3 3  0 0  Down, Depressed, Hopeless 1 2 2  0 0  PHQ - 2 Score 3 5 5  0 0  Altered sleeping 0 0 0 0 0  Tired, decreased energy 0 3 3 0 0  Change in appetite 2 0 0 0 0  Feeling bad or failure about yourself  1 1 1  0 0  Trouble concentrating 0 0 0 0 0  Moving slowly or fidgety/restless 0 0 0 0 0  Suicidal thoughts  0 0 0 0 0  PHQ-9 Score 6 9 9   0  0   Difficult doing work/chores Somewhat difficult Somewhat difficult Somewhat difficult Not difficult at all Not difficult at all    Details       Data saved with a previous flowsheet row definition        Interpretation of Total Score  Total Score Depression Severity:  1-4 = Minimal depression, 5-9 = Mild depression, 10-14 = Moderate depression, 15-19 = Moderately severe depression, 20-27 = Severe depression   Psychosocial Evaluation and Intervention:  Psychosocial Evaluation - 10/12/24 1035       Psychosocial Evaluation & Interventions   Interventions Relaxation education;Encouraged to exercise with the program and follow exercise prescription    Expected Outcomes Pt to participate in PR    Continue Psychosocial Services  Follow up required by staff          Psychosocial Re-Evaluation:   Psychosocial Discharge (Final Psychosocial Re-Evaluation):   Education: Education Goals: Education classes will be provided on a weekly basis, covering required topics. Participant will state understanding/return demonstration of topics presented.  Learning Barriers/Preferences:  Learning Barriers/Preferences - 10/12/24 1036       Learning Barriers/Preferences   Learning Barriers Sight;Hearing    Learning Preferences Skilled Demonstration          General Pulmonary Education Topics:  Infection Prevention: - Provides verbal and written material to individual with discussion of infection control including proper hand washing and proper equipment cleaning during exercise session.   Falls Prevention: - Provides verbal and written material to individual with discussion of falls prevention and safety.   Chronic Lung Disease Review: - Group verbal instruction with posters, models, PowerPoint presentations and videos,  to review new updates, new respiratory medications, new advancements in procedures and treatments. Providing information on  websites and 800 numbers for continued self-education. Includes information about supplement oxygen , available portable oxygen  systems, continuous and intermittent flow rates, oxygen  safety, concentrators, and Medicare reimbursement for oxygen . Explanation of Pulmonary Drugs, including class, frequency, complications, importance of spacers, rinsing mouth after steroid MDI's, and proper cleaning methods for nebulizers. Review of basic lung anatomy and physiology related to function, structure, and complications of lung disease. Review of risk factors. Discussion about methods for diagnosing sleep apnea and types of masks and machines for OSA. Includes a review of the use of types of environmental controls: home humidity, furnaces, filters, dust mite/pet prevention, HEPA vacuums. Discussion about weather changes, air quality and the benefits of nasal washing. Instruction on Warning signs, infection symptoms, calling MD promptly, preventive modes, and value of vaccinations. Review of effective airway clearance, coughing and/or vibration techniques. Emphasizing that all should Create an Action Plan. Written material provided at class time.   AED/CPR: - Group verbal  and written instruction with the use of models to demonstrate the basic use of the AED with the basic ABC's of resuscitation.    Tests and Procedures:  - Group verbal and visual presentation and models provide information about basic cardiac anatomy and function. Reviews the testing methods done to diagnose heart disease and the outcomes of the test results. Describes the treatment choices: Medical Management, Angioplasty, or Coronary Bypass Surgery for treating various heart conditions including Myocardial Infarction, Angina, Valve Disease, and Cardiac Arrhythmias.  Written material provided at class time.   Medication Safety: - Group verbal and visual instruction to review commonly prescribed medications for heart and lung disease. Reviews  the medication, class of the drug, and side effects. Includes the steps to properly store meds and maintain the prescription regimen.  Written material given at graduation.   Other: -Provides group and verbal instruction on various topics (see comments)   Knowledge Questionnaire Score:    Core Components/Risk Factors/Patient Goals at Admission:  Personal Goals and Risk Factors at Admission - 10/12/24 1036       Core Components/Risk Factors/Patient Goals on Admission    Weight Management Weight Loss   has gained from 215 to 250 since losing toes, did [facility] program 1 year ago   Improve shortness of breath with ADL's Yes    Intervention Provide education, individualized exercise plan and daily activity instruction to help decrease symptoms of SOB with activities of daily living.    Expected Outcomes Short Term: Improve cardiorespiratory fitness to achieve a reduction of symptoms when performing ADLs;Long Term: Be able to perform more ADLs without symptoms or delay the onset of symptoms          Education:Diabetes - Individual verbal and written instruction to review signs/symptoms of diabetes, desired ranges of glucose level fasting, after meals and with exercise. Acknowledge that pre and post exercise glucose checks will be done for 3 sessions at entry of program.   Know Your Numbers and Heart Failure: - Group verbal and visual instruction to discuss disease risk factors for cardiac and pulmonary disease and treatment options.  Reviews associated critical values for Overweight/Obesity, Hypertension, Cholesterol, and Diabetes.  Discusses basics of heart failure: signs/symptoms and treatments.  Introduces Heart Failure Zone chart for action plan for heart failure. Written material provided at class time.   Core Components/Risk Factors/Patient Goals Review:    Core Components/Risk Factors/Patient Goals at Discharge (Final Review):    ITP Comments:   Comments: Dr.  Slater Staff is Medical Director for Pulmonary Rehab at [facility].

## 2024-10-12 NOTE — Progress Notes (Signed)
 [name] DELENA Chester 78 y.o. male Pulmonary Rehab Orientation Note This patient who was referred to Pulmonary Rehab by Dr. Meade with the diagnosis of COPD 3 arrived today in Cardiac and Pulmonary Rehab. He arrived ambulatory with assistive device with normal gait. He does carry portable oxygen . [employer] is the provider for their DME. Per patient, Gervase uses oxygen  continuously. Color good, skin warm and dry. Patient is oriented to time and place. Patient's medical history, psychosocial health, and medications reviewed.  Psychosocial assessment reveals patient lives with spouse. Taysean is currently retired. He has a part time job but has been unable to work since July 2025 due to toe amputations and requiring oxygen . Patient hobbies include watching tv and spending time with others. Patient reports his stress level is moderate. Areas of stress/anxiety include health, family , and finances. Patient does exhibit signs of depression. Signs of depression include hopelessness and difficulty maintaining sleep. PHQ2/9 score 3/6. Rashan shows good  coping skills with positive outlook on life. Offered emotional support and reassurance. Will continue to monitor.   Physical assessment performed by Ronal Levin RN. Please see their orientation physical assessment note. Loron reports he  does not take medications as prescribed. Patient states he  follows a diabetic diet. Radames is trying to decrease carbs/sugars but has not been able to be very active and therefore has gained 35 lbs. Patient's weight will be monitored closely.   Demonstration and practice of PLB using pulse oximeter. Chad able to return demonstration satisfactorily. Safety and hand hygiene in the exercise area reviewed with patient. Billey voices understanding of the information reviewed. Department expectations discussed with patient and achievable goals were set. The patient shows enthusiasm about attending the program and we look forward to working with [name].  Christiaan completed a 6 min walk test today and is scheduled to begin exercise on 10/20/24 at 1:15 pm.  [id number] Cloyd Aliene Aris, MS, ACSM-CEP

## 2024-10-12 NOTE — Progress Notes (Signed)
 [name] 78 y.o. male  Initial Psychosocial Assessment  Pt psychosocial assessment reveals pt lives with their spouse. Pt is currently retired. Pt hobbies include watching tv and spending time with others. Pt reports his  stress level is moderate. Areas of stress/anxiety include health, family , and finances.  Pt does exhibit signs of depression. Signs of depression include hopelessness and difficulty maintaining sleep. Pt shows good  coping skills with positive outlook . Offered emotional support and reassurance. Monitor and evaluate progress toward psychosocial goal(s).  Goal(s): Improved management of depression Improved coping skills Help patient work toward returning to meaningful activities that improve patient's QOL and are attainable with patient's lung disease   10/12/2024 12:27 PM

## 2024-10-12 NOTE — Progress Notes (Signed)
 Pulmonary Rehab Orientation Physical Assessment Note  Physical assessment reveals patient is alert and oriented x 4. Heart rate is normal, breath sounds diminished to auscultation, no wheezes, rales, or rhonchi. Denies chronic productive/non-productive cough. Bowel sounds present x4 quads. Pt denies abdominal discomfort, nausea, vomiting, diarrhea or constipation. Grip strength equal, strong. Distal pulses +2; no swelling to lower extremities, toes 1-5 on left foot amputated.   Ronal Levin, RN, BSN
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
## 2024-10-16 ENCOUNTER — Encounter: Payer: Self-pay | Admitting: Student

## 2024-10-16 ENCOUNTER — Ambulatory Visit: Admitting: Student

## 2024-10-16 VITALS — BP 119/59 | HR 70 | Ht 69.0 in | Wt 258.6 lb

## 2024-10-16 DIAGNOSIS — K219 Gastro-esophageal reflux disease without esophagitis: Secondary | ICD-10-CM

## 2024-10-16 DIAGNOSIS — E66812 Obesity, class 2: Secondary | ICD-10-CM

## 2024-10-16 DIAGNOSIS — F419 Anxiety disorder, unspecified: Secondary | ICD-10-CM

## 2024-10-16 DIAGNOSIS — F0390 Unspecified dementia without behavioral disturbance: Secondary | ICD-10-CM

## 2024-10-16 DIAGNOSIS — E039 Hypothyroidism, unspecified: Secondary | ICD-10-CM

## 2024-10-16 DIAGNOSIS — R413 Other amnesia: Secondary | ICD-10-CM

## 2024-10-16 DIAGNOSIS — Z89432 Acquired absence of left foot: Secondary | ICD-10-CM

## 2024-10-16 DIAGNOSIS — J432 Centrilobular emphysema: Secondary | ICD-10-CM

## 2024-10-16 DIAGNOSIS — N289 Disorder of kidney and ureter, unspecified: Secondary | ICD-10-CM

## 2024-10-16 DIAGNOSIS — E1149 Type 2 diabetes mellitus with other diabetic neurological complication: Secondary | ICD-10-CM

## 2024-10-16 DIAGNOSIS — I6523 Occlusion and stenosis of bilateral carotid arteries: Secondary | ICD-10-CM

## 2024-10-16 DIAGNOSIS — I10 Essential (primary) hypertension: Secondary | ICD-10-CM

## 2024-10-16 DIAGNOSIS — F32A Depression, unspecified: Secondary | ICD-10-CM

## 2024-10-16 LAB — VITAMIN B12: Vitamin B-12: 457 pg/mL (ref 211–911)

## 2024-10-16 LAB — VITAMIN D 25 HYDROXY (VIT D DEFICIENCY, FRACTURES): VITD: 42.26 ng/mL (ref 30.00–100.00)

## 2024-10-16 MED ORDER — BUSPIRONE HCL 10 MG PO TABS: 10.0000 mg | ORAL_TABLET | Freq: Two times a day (BID) | ORAL | 2 refills | Status: DC

## 2024-10-16 NOTE — Patient Instructions (Signed)
  [facility] Providers: [facility] located at [address], [place], [place].  [phone].    [facility])- [address] [place], [place] [address] - [phone]  [facility] located at [address], [place], [place]. [phone].  [facility] located at [address] [place], [place].  [phone].  [facility] located at [address] [place], [place], [place].  [phone].  [facility] located at [address], [place], [place].  [phone].    [facility] located at [address] [place], [place], [place].  [phone].    [facility] - 4 local practices located at: [address] [place], [place].  [phone]. [address] [place], [place], [place].  [phone]. [address], [place], [place].  [phone]. [address], [place], [place]. [phone].  [facility] located at [address], [place], [place]. [phone].    [facility] located at [address] [place], [place] [address]- [phone]   [facility] located at [address] [place], [place] [address] [phone]    [facility] located at [address] [place], [place], [place] [address] = [phone]   [facility] located at [address] [place], [place] [address] = [phone]   [facility] [address] [place], [place] [address] - [phone]  [facility] [address] [place], [place], [place] [address]- [phone]   [facility] [address] [place], [place] [address]- [phone]  [facility] [address] [place], [place] -[phone]  [facility]- [address], [place], [place] [address]- [phone]  [facility]- [address] [place], [place], [place]- [phone] (several Locations)  [facility] - [address] [place]ie [place], [place] [address]- [phone]   [facility]- [address] [place], [place], [place] [phone]  [facility]- [address], [place], [place] [address] [phone]    [facility]- [address], [place], [place] [address]- [phone]  [facility]- [address] [place]ana Hoyle [place], [place] [address]- [phone]  [facility] [address], [place], [place] [address]- [phone]. [facility], [facility] and [place] [phone] [facility] [address] [place], [place] [phone] [facility]- [address] [place], [place], [place] - [phone]  [facility]- [address] [place], [place], [place] [address]- [phone]  [facility]- [address], [place] -  [phone]  [facility] - [address], [place]- [phone]

## 2024-10-16 NOTE — Assessment & Plan Note (Signed)
 On Levothyroxine, continue to monitor

## 2024-10-16 NOTE — Assessment & Plan Note (Signed)
 Persistent anxiety and depression with situational stressors. Denies SI/HI. Current stressors include being unable to return to work, which has contributed to a decline in mood. He also reports that his son, with whom he has had limited contact, was recently diagnosed with leukemia. Current treatment includes Prozac  40 mg and Buspar  5 mg BID, with no significant improvement from Buspar .  Discussed counseling benefits and social engagement. Emphasized lifestyle changes. - Increased Buspar  to 10 mg twice a day. - Provided list of available counseling services. - Ordered vitamin B12 and vitamin D  levels. -Advise psychiatric evaluation, pt declines at this time.  - Scheduled follow-up in two months.

## 2024-10-16 NOTE — Assessment & Plan Note (Signed)
 Well controlled, no changes to meds. Encouraged heart healthy diet such as the DASH diet and exercise as tolerated.

## 2024-10-16 NOTE — Assessment & Plan Note (Signed)
 hgba1c acceptable, minimize simple carbs. Increase exercise as tolerated. Continue current meds

## 2024-10-16 NOTE — Assessment & Plan Note (Signed)
 Encouraged DASH or MIND diet, decrease po intake and increase exercise as tolerated. Needs 7-8 hours of sleep nightly. Avoid trans fats, eat small, frequent meals every 4-5 hours with lean proteins, complex carbs and healthy fats. Minimize simple carbs, high fat foods and processed foods

## 2024-10-16 NOTE — Assessment & Plan Note (Signed)
 Denies recent exacerbations. Follows with Pulmonology.

## 2024-10-16 NOTE — Assessment & Plan Note (Signed)
 Continue Namenda . Following with Neurology.

## 2024-10-16 NOTE — Assessment & Plan Note (Signed)
 Hydrate and monitor

## 2024-10-19 ENCOUNTER — Ambulatory Visit: Payer: Self-pay | Admitting: Student

## 2024-10-20 ENCOUNTER — Encounter (HOSPITAL_COMMUNITY)

## 2024-10-20 ENCOUNTER — Encounter (HOSPITAL_COMMUNITY): Admission: RE | Admit: 2024-10-20 | Discharge: 2024-10-20 | Attending: Internal Medicine

## 2024-10-20 DIAGNOSIS — J449 Chronic obstructive pulmonary disease, unspecified: Secondary | ICD-10-CM | POA: Diagnosis not present

## 2024-10-20 LAB — GLUCOSE, CAPILLARY: Glucose-Capillary: 185 mg/dL — ABNORMAL HIGH (ref 70–99)

## 2024-10-20 NOTE — Progress Notes (Signed)
 Daily Session Note  Patient Details  Name: [name] MRN: [id number] Date of Birth: 12-Feb-1946 Referring Provider:   Conrad Ports Pulmonary Rehab Walk Test from 10/12/2024 in [facility]  Referring Provider [place]    Encounter Date: 10/20/2024  Check In:  Session Check In - 10/20/24 9166       Check-In   Supervising physician immediately available to respond to emergencies [facility] MD immediately available    Physician(s) Orren Fabry, NP    Location [facility]    Staff Present Ronal Levin, RN, Maud Moats, MS, ACSM-CEP, Exercise Physiologist;Helyn Schwan Claudene, RT    Virtual Visit No    Medication changes reported     No    Fall or balance concerns reported    No    Tobacco Cessation No Change    Warm-up and Cool-down Performed as group-led instruction   orientation only   Resistance Training Performed Yes    VAD Patient? No    PAD/SET Patient? No      Pain Assessment   Currently in Pain? No/denies    Multiple Pain Sites No          Capillary Blood Glucose: Results for orders placed or performed during the hospital encounter of 10/20/24 (from the past 24 hours)  Glucose, capillary     Status: Abnormal   Collection Time: 10/20/24  9:23 AM  Result Value Ref Range   Glucose-Capillary 185 (H) 70 - 99 mg/dL   *Note: Due to a large number of results and/or encounters for the requested time period, some results have not been displayed. A complete set of results can be found in Results Review.      Tobacco Use History[1]  Goals Met:  Proper associated with RPD/PD & O2 Sat Independence with exercise equipment Exercise tolerated well No report of concerns or symptoms today Strength training completed today  Goals Unmet:  Not Applicable  Comments: Service time is from 0811 to 0930.    Dr. Slater Staff is Medical Director for Pulmonary Rehab at [facility].     [1]  Social  History Tobacco Use  Smoking Status Former   Current packs/day: 0.00   Average packs/day: 2.0 packs/day for 40.0 years (80.0 ttl pk-yrs)   Types: Cigarettes, Cigars   Start date: 05/04/1966   Quit date: 05/04/2006   Years since quitting: 18.4  Smokeless Tobacco Never

## 2024-10-22 ENCOUNTER — Encounter (HOSPITAL_COMMUNITY)

## 2024-10-22 DIAGNOSIS — J449 Chronic obstructive pulmonary disease, unspecified: Secondary | ICD-10-CM

## 2024-10-22 LAB — GLUCOSE, CAPILLARY
Glucose-Capillary: 132 mg/dL — ABNORMAL HIGH (ref 70–99)
Glucose-Capillary: 113 mg/dL — ABNORMAL HIGH (ref 70–99)

## 2024-10-22 NOTE — Progress Notes (Signed)
 Daily Session Note  Patient Details  Name: [name] MRN: [id number] Date of Birth: 04/03/1946 Referring Provider:   Conrad Ports Pulmonary Rehab Walk Test from 10/12/2024 in [facility]  Referring Provider Meade    Encounter Date: 10/22/2024  Check In:  Session Check In - 10/22/24 0817       Check-In   Supervising physician immediately available to respond to emergencies [facility] MD immediately available    Physician(s) Barnie Hila, NP    [facility]    Staff Present Ronal Levin, RN, Maud Moats, MS, ACSM-CEP, Exercise Physiologist;Casey Claudene, RT    Virtual Visit No    Medication changes reported     No    Fall or balance concerns reported    No    Tobacco Cessation No Change    Warm-up and Cool-down Performed as group-led instruction   orientation only   Resistance Training Performed Yes    VAD Patient? No    PAD/SET Patient? No      Pain Assessment   Currently in Pain? No/denies    Multiple Pain Sites No          Capillary Blood Glucose: Results for orders placed or performed during the hospital encounter of 10/22/24 (from the past 24 hours)  Glucose, capillary     Status: Abnormal   Collection Time: 10/22/24  8:12 AM  Result Value Ref Range   Glucose-Capillary 132 (H) 70 - 99 mg/dL  Glucose, capillary     Status: Abnormal   Collection Time: 10/22/24  9:36 AM  Result Value Ref Range   Glucose-Capillary 113 (H) 70 - 99 mg/dL   *Note: Due to a large number of results and/or encounters for the requested time period, some results have not been displayed. A complete set of results can be found in Results Review.      Tobacco Use History[1]  Goals Met:  Exercise tolerated well Queuing for purse lip breathing No report of concerns or symptoms today Strength training completed today  Goals Unmet:  Not Applicable  Comments: Service time is from 0805 to 0936     Dr. Slater Staff is Medical Director for Pulmonary Rehab at [facility].     [1]  Social History Tobacco Use  Smoking Status Former   Current packs/day: 0.00   Average packs/day: 2.0 packs/day for 40.0 years (80.0 ttl pk-yrs)   Types: Cigarettes, Cigars   Start date: 05/04/1966   Quit date: 05/04/2006   Years since quitting: 18.4  Smokeless Tobacco Never

## 2024-10-27 ENCOUNTER — Encounter (HOSPITAL_COMMUNITY)
Admission: RE | Admit: 2024-10-27 | Discharge: 2024-10-27 | Disposition: A | Discharge status: Home / Self Care | Source: Ambulatory Visit | Attending: Internal Medicine | Admitting: Internal Medicine

## 2024-10-27 ENCOUNTER — Encounter (HOSPITAL_COMMUNITY)

## 2024-10-27 VITALS — Wt 253.5 lb

## 2024-10-27 DIAGNOSIS — J449 Chronic obstructive pulmonary disease, unspecified: Secondary | ICD-10-CM | POA: Diagnosis not present

## 2024-10-27 LAB — GLUCOSE, CAPILLARY: Glucose-Capillary: 126 mg/dL — ABNORMAL HIGH (ref 70–99)

## 2024-10-27 NOTE — Progress Notes (Signed)
 Daily Session Note  Patient Details  Name: [name] MRN: [id number] Date of Birth: 1945-11-06 Referring Provider:   Conrad Ports [facility] Walk Test from 10/12/2024 in [facility]  Referring Provider [place]    Encounter Date: 10/27/2024  Check In:  Session Check In - 10/27/24 0817       Check-In   Supervising physician immediately available to respond to emergencies [facility] MD immediately available    Physician(s) Barnie Hila, NP    Location [facility]    Staff Present Ronal Levin, RN, Wetzel Sharps, RT    Virtual Visit No    Medication changes reported     No    Fall or balance concerns reported    No    Tobacco Cessation No Change    Warm-up and Cool-down Performed as group-led instruction   orientation only   Resistance Training Performed Yes    VAD Patient? No    PAD/SET Patient? No      Pain Assessment   Currently in Pain? No/denies    Multiple Pain Sites No          Capillary Blood Glucose: Results for orders placed or performed during the hospital encounter of 10/27/24 (from the past 24 hours)  Glucose, capillary     Status: Abnormal   Collection Time: 10/27/24  9:16 AM  Result Value Ref Range   Glucose-Capillary 126 (H) 70 - 99 mg/dL   *Note: Due to a large number of results and/or encounters for the requested time period, some results have not been displayed. A complete set of results can be found in Results Review.     Exercise Prescription Changes - 10/27/24 0900       Response to Exercise   Blood Pressure (Admit) 132/56    Blood Pressure (Exercise) 126/62    Blood Pressure (Exit) 120/54    Heart Rate (Admit) 82 bpm    Heart Rate (Exercise) 90 bpm    Heart Rate (Exit) 76 bpm    Oxygen  Saturation (Admit) 96 %    Oxygen  Saturation (Exercise) 95 %    Oxygen  Saturation (Exit) 97 %    Rating of Perceived Exertion (Exercise) 14    Perceived Dyspnea (Exercise)  3    Duration Continue with 30 min of aerobic exercise without signs/symptoms of physical distress.    Intensity THRR unchanged      Progression   Progression Continue to progress workloads to maintain intensity without signs/symptoms of physical distress.      Resistance Training   Weight red bands    Reps 10-15    Time 10 Minutes      Oxygen    Oxygen  Continuous    Liters 4      T5 Nustep   Level 3    SPM 63    Minutes 15    METs 1.8      Track   Laps 5    Minutes 15    METs 1.77      Oxygen    Maintain Oxygen  Saturation 88% or higher          Tobacco Use History[1]  Goals Met:  Proper associated with RPD/PD & O2 Sat Independence with exercise equipment Exercise tolerated well No report of concerns or symptoms today Strength training completed today  Goals Unmet:  Not Applicable  Comments: Service time is from 0805 to 0915.    Dr. Slater Staff is Medical Director  for Pulmonary Rehab at [facility].     [1]  Social History Tobacco Use  Smoking Status Former   Current packs/day: 0.00   Average packs/day: 2.0 packs/day for 40.0 years (80.0 ttl pk-yrs)   Types: Cigarettes, Cigars   Start date: 05/04/1966   Quit date: 05/04/2006   Years since quitting: 18.4  Smokeless Tobacco Never

## 2024-10-28 NOTE — Progress Notes (Signed)
 Pulmonary Individual Treatment Plan  Patient Details  Name: [name] MRN: [id number] Date of Birth: November 16, 1945 Referring Provider:   Conrad Ports Pulmonary Rehab Walk Test from 10/12/2024 in [facility]  Referring Provider Meade    Initial Encounter Date:  Flowsheet Row Pulmonary Rehab Walk Test from 10/12/2024 in [facility]  Date 10/12/24    Visit Diagnosis: Stage 3 severe COPD by GOLD classification [facility])  Patient's Home Medications on Admission:  Current Medications[1]  Past Medical History: Past Medical History:  Diagnosis Date   AAA (abdominal aortic aneurysm) without rupture 2008   Stable AAA max diameter 4.1cm but likely 3.5x3.7cm, rpt 1 yr (09/2015)   Allergic rhinitis    Anemia 08/14/2013   Aneurysm, cerebral, nonruptured 03/02/2014   Arthritis    left ankle; back LLE; right wrist  (11/10/2012)   Asthma    Back pain 03/17/2017   Carotid artery disease 05/04/2022   Cellulitis and abscess of toe of left foot 05/26/2019   Class 1 obesity with serious comorbidity and body mass index (BMI) of 32.0 to 32.9 in adult 12/16/2018   COPD (chronic obstructive pulmonary disease) 02/17/2015   Coronary atherosclerosis 10/17/2007   Decreased hearing    Diabetes mellitus type 2 with neurological manifestations 04/21/2007   Sees Podiatry  Sees [facility] for eye exam, 05/06/13   Diabetic foot ulcer 05/30/2020   Diabetic peripheral vascular disease    Dyspnea 03/06/2022   Dysrhythmia    skips beats at times   Generalized anxiety disorder 06/05/2022   GERD 11/16/2009   Glaucoma 10/17/2007   Gout 12/06/2016   Hereditary and idiopathic peripheral neuropathy 10/17/2014   History of COVID-19 12/04/2019   Hyperlipidemia associated with type 2 diabetes mellitus 06/13/2020   Hypertension    Hypothyroidism 05/20/2021   Hypoxemia 09/07/2021   Iliac  artery stenosis, left 10/19/2018   Kidney stone    passed them on my own 3 times (11/10/2012)   Knee pain, left 12/12/2009   Major depressive disorder 05/25/2018   Mild obstructive sleep apnea 03/06/2022   With hypoxia, no CPAP   Neck pain 12/27/2020   Osteoarthritis 06/05/2022   Pneumonia 2011   Prolonged QT interval 09/07/2021   Renal artery stenosis 10/19/2018   Renal insufficiency 08/14/2013   Right hip pain    Right otitis media 05/30/2020   SCCA (squamous cell carcinoma) of skin 07/28/2018   Left Hand Dorsum (well diff) (curet and 5FU)   Stroke 05/09/2005   Small chronic cortical and white matter infarcts within the right ACA/MCA and right ACA/PCA watershed territories   Synovial cyst of lumbar facet joint 04/29/2017   Tinnitus     Tobacco Use: Tobacco Use History[2]  Labs: Review Flowsheet  More data exists      Latest Ref Rng & Units 11/19/2023 01/28/2024 04/15/2024 05/15/2024 08/28/2024  Labs for ITP Cardiac and Pulmonary Rehab  Cholestrol 0 - 200 mg/dL - - - 873  878   LDL (calc) 0 - 99 mg/dL - - - 68  64   HDL-C >60.99 mg/dL - - - 65.79  67.19   Trlycerides 0.0 - 149.0 mg/dL - - - 877.9  880.9   Hemoglobin A1c 4.6 - 6.5 % 7.0  6.7  - 7.0  6.6   TCO2 22 - 32 mmol/L - - 27  - -    Capillary Blood Glucose: Lab Results  Component Value  Date   GLUCAP 126 (H) 10/27/2024   GLUCAP 113 (H) 10/22/2024   GLUCAP 132 (H) 10/22/2024   GLUCAP 185 (H) 10/20/2024   GLUCAP 133 (H) 05/20/2024     Pulmonary Assessment Scores:  Pulmonary Assessment Scores     Row Name 10/12/24 1039         ADL UCSD   ADL Phase Entry     SOB Score total 94       CAT Score   CAT Score 20       mMRC Score   mMRC Score 4       UCSD: Self-administered rating of dyspnea associated with activities of daily living (ADLs) 6-point scale (0 = not at all to 5 = maximal or unable to do because of breathlessness)  Scoring Scores range from 0 to 120.  Minimally important difference is 5  units  CAT: CAT can identify the health impairment of COPD patients and is better correlated with disease progression.  CAT has a scoring range of zero to 40. The CAT score is classified into four groups of low (less than 10), medium (10 - 20), high (21-30) and very high (31-40) based on the impact level of disease on health status. A CAT score over 10 suggests significant symptoms.  A worsening CAT score could be explained by an exacerbation, poor medication adherence, poor inhaler technique, or progression of COPD or comorbid conditions.  CAT MCID is 2 points  mMRC: mMRC (Modified Medical Research Council) Dyspnea Scale is used to assess the degree of baseline functional disability in patients of respiratory disease due to dyspnea. No minimal important difference is established. A decrease in score of 1 point or greater is considered a positive change.   Pulmonary Function Assessment:  Pulmonary Function Assessment - 10/12/24 1226       Breath   Bilateral Breath Sounds Decreased    Shortness of Breath Yes;Limiting activity          Exercise Target Goals: Exercise Program Goal: Individual exercise prescription set using results from initial 6 min walk test and THRR while considering  patients activity barriers and safety.   Exercise Prescription Goal: Initial exercise prescription builds to 30-45 minutes a day of aerobic activity, 2-3 days per week.  Home exercise guidelines will be given to patient during program as part of exercise prescription that the participant will acknowledge.  Activity Barriers & Risk Stratification:  Activity Barriers & Cardiac Risk Stratification - 10/12/24 1040       Activity Barriers & Cardiac Risk Stratification   Activity Barriers Muscular Weakness;Shortness of Breath;Deconditioning;Arthritis;Assistive Device;Balance Concerns    Cardiac Risk Stratification High          6 Minute Walk:  6 Minute Walk     Row Name 10/12/24 1217          6 Minute Walk   Phase Initial     Distance 640 feet     Walk Time 6 minutes     # of Rest Breaks 1  4:00-5:00     MPH 1.21     METS 0.85     RPE 11     Perceived Dyspnea  1     VO2 Peak 2.98     Symptoms Yes (comment)     Comments Used GoCart for stability. Rest 1 min due to hip pain. Only needed 3L but might have needed 4L if hadn't rested     Resting HR 66 bpm     Resting  BP 106/50     Resting Oxygen  Saturation  94 %     Exercise Oxygen  Saturation  during 6 min walk 88 %     Max Ex. HR 101 bpm     Max Ex. BP 148/50     2 Minute Post BP 150/60       Interval HR   1 Minute HR 82     2 Minute HR 95     3 Minute HR 98     4 Minute HR 101     5 Minute HR 89     6 Minute HR 94     2 Minute Post HR 78     Interval Heart Rate? Yes       Interval Oxygen    Interval Oxygen ? Yes     Baseline Oxygen  Saturation % 94 %     1 Minute Oxygen  Saturation % 93 %     1 Minute Liters of Oxygen  2 L     2 Minute Oxygen  Saturation % 89 %     2 Minute Liters of Oxygen  2 L     3 Minute Oxygen  Saturation % 89 %     3 Minute Liters of Oxygen  3 L     4 Minute Oxygen  Saturation % 88 %     4 Minute Liters of Oxygen  3 L     5 Minute Oxygen  Saturation % 91 %  had rested for 1 min     5 Minute Liters of Oxygen  3 L     6 Minute Oxygen  Saturation % 92 %     6 Minute Liters of Oxygen  3 L     2 Minute Post Oxygen  Saturation % 95 %     2 Minute Post Liters of Oxygen  3 L        Oxygen  Initial Assessment:  Oxygen  Initial Assessment - 10/12/24 1038       Home Oxygen    Home Oxygen  Device Home Concentrator;Portable Concentrator;E-Tanks    Sleep Oxygen  Prescription Continuous    Liters per minute 4    Home Exercise Oxygen  Prescription Continuous    Liters per minute 4    Home Resting Oxygen  Prescription Continuous    Liters per minute 4    Compliance with Home Oxygen  Use Yes      Initial 6 min Walk   Oxygen  Used Continuous    Liters per minute 4      Program Oxygen  Prescription   Program  Oxygen  Prescription Continuous    Liters per minute 4      Intervention   Short Term Goals To learn and exhibit compliance with exercise, home and travel O2 prescription;To learn and understand importance of maintaining oxygen  saturations>88%;To learn and demonstrate proper use of respiratory medications;To learn and understand importance of monitoring SPO2 with pulse oximeter and demonstrate accurate use of the pulse oximeter.;To learn and demonstrate proper pursed lip breathing techniques or other breathing techniques. ;Other    Long  Term Goals Other;Demonstrates proper use of MDIs;Compliance with respiratory medication;Exhibits proper breathing techniques, such as pursed lip breathing or other method taught during program session;Maintenance of O2 saturations>88%;Verbalizes importance of monitoring SPO2 with pulse oximeter and return demonstration;Exhibits compliance with exercise, home  and travel O2 prescription          Oxygen  Re-Evaluation:  Oxygen  Re-Evaluation     Row Name 10/15/24 1631             Program Oxygen  Prescription   Program Oxygen   Prescription Continuous       Liters per minute 4         Home Oxygen    Home Oxygen  Device Home Concentrator;Portable Concentrator;E-Tanks       Sleep Oxygen  Prescription Continuous       Liters per minute 4       Home Exercise Oxygen  Prescription Continuous       Liters per minute 4       Home Resting Oxygen  Prescription Continuous       Liters per minute 4       Compliance with Home Oxygen  Use Yes         Goals/Expected Outcomes   Short Term Goals To learn and exhibit compliance with exercise, home and travel O2 prescription;To learn and understand importance of maintaining oxygen  saturations>88%;To learn and demonstrate proper use of respiratory medications;To learn and understand importance of monitoring SPO2 with pulse oximeter and demonstrate accurate use of the pulse oximeter.;To learn and demonstrate proper pursed lip  breathing techniques or other breathing techniques. ;Other       Long  Term Goals Other;Demonstrates proper use of MDIs;Compliance with respiratory medication;Exhibits proper breathing techniques, such as pursed lip breathing or other method taught during program session;Maintenance of O2 saturations>88%;Verbalizes importance of monitoring SPO2 with pulse oximeter and return demonstration;Exhibits compliance with exercise, home  and travel O2 prescription       Goals/Expected Outcomes Compliance and understanding of oxygen  saturation monitoring and breathing techniques to decrease shortness of breath.          Oxygen  Discharge (Final Oxygen  Re-Evaluation):  Oxygen  Re-Evaluation - 10/15/24 1631       Program Oxygen  Prescription   Program Oxygen  Prescription Continuous    Liters per minute 4      Home Oxygen    Home Oxygen  Device Home Concentrator;Portable Concentrator;E-Tanks    Sleep Oxygen  Prescription Continuous    Liters per minute 4    Home Exercise Oxygen  Prescription Continuous    Liters per minute 4    Home Resting Oxygen  Prescription Continuous    Liters per minute 4    Compliance with Home Oxygen  Use Yes      Goals/Expected Outcomes   Short Term Goals To learn and exhibit compliance with exercise, home and travel O2 prescription;To learn and understand importance of maintaining oxygen  saturations>88%;To learn and demonstrate proper use of respiratory medications;To learn and understand importance of monitoring SPO2 with pulse oximeter and demonstrate accurate use of the pulse oximeter.;To learn and demonstrate proper pursed lip breathing techniques or other breathing techniques. ;Other    Long  Term Goals Other;Demonstrates proper use of MDIs;Compliance with respiratory medication;Exhibits proper breathing techniques, such as pursed lip breathing or other method taught during program session;Maintenance of O2 saturations>88%;Verbalizes importance of monitoring SPO2 with pulse  oximeter and return demonstration;Exhibits compliance with exercise, home  and travel O2 prescription    Goals/Expected Outcomes Compliance and understanding of oxygen  saturation monitoring and breathing techniques to decrease shortness of breath.          Initial Exercise Prescription:  Initial Exercise Prescription - 10/12/24 1200       Date of Initial Exercise RX and Referring Provider   Date 10/12/24    Referring Provider Meade    Expected Discharge Date 01/15/24      Oxygen    Oxygen  Continuous    Maintain Oxygen  Saturation 88% or higher      T5 Nustep   Level 1    SPM 60    Minutes  15    METs 1.5      Track   Laps 7    Minutes 15    METs 1.2      Prescription Details   Frequency (times per week) 2    Duration Progress to 30 minutes of continuous aerobic without signs/symptoms of physical distress      Intensity   THRR 40-80% of Max Heartrate 57-144    Ratings of Perceived Exertion 11-13    Perceived Dyspnea 0-4      Progression   Progression Continue progressive overload as per policy without signs/symptoms or physical distress.      Resistance Training   Training Prescription Yes    Weight red bands    Reps 10-15          Perform Capillary Blood Glucose checks as needed.  Exercise Prescription Changes:   Exercise Prescription Changes     Row Name 10/27/24 0900             Response to Exercise   Blood Pressure (Admit) 132/56       Blood Pressure (Exercise) 126/62       Blood Pressure (Exit) 120/54       Heart Rate (Admit) 82 bpm       Heart Rate (Exercise) 90 bpm       Heart Rate (Exit) 76 bpm       Oxygen  Saturation (Admit) 96 %       Oxygen  Saturation (Exercise) 95 %       Oxygen  Saturation (Exit) 97 %       Rating of Perceived Exertion (Exercise) 14       Perceived Dyspnea (Exercise) 3       Duration Continue with 30 min of aerobic exercise without signs/symptoms of physical distress.       Intensity THRR unchanged          Progression   Progression Continue to progress workloads to maintain intensity without signs/symptoms of physical distress.         Resistance Training   Weight red bands       Reps 10-15       Time 10 Minutes         Oxygen    Oxygen  Continuous       Liters 4         T5 Nustep   Level 3       SPM 63       Minutes 15       METs 1.8         Track   Laps 5       Minutes 15       METs 1.77         Oxygen    Maintain Oxygen  Saturation 88% or higher          Exercise Comments:   Exercise Goals and Review:   Exercise Goals     Row Name 10/12/24 1041             Exercise Goals   Increase Physical Activity Yes       Intervention Provide advice, education, support and counseling about physical activity/exercise needs.;Develop an individualized exercise prescription for aerobic and resistive training based on initial evaluation findings, risk stratification, comorbidities and participant's personal goals.       Expected Outcomes Short Term: Attend rehab on a regular basis to increase amount of physical activity.;Long Term: Add in home exercise to make exercise part of routine and  to increase amount of physical activity.;Long Term: Exercising regularly at least 3-5 days a week.       Increase Strength and Stamina Yes       Intervention Provide advice, education, support and counseling about physical activity/exercise needs.;Develop an individualized exercise prescription for aerobic and resistive training based on initial evaluation findings, risk stratification, comorbidities and participant's personal goals.       Expected Outcomes Short Term: Increase workloads from initial exercise prescription for resistance, speed, and METs.;Short Term: Perform resistance training exercises routinely during rehab and add in resistance training at home;Long Term: Improve cardiorespiratory fitness, muscular endurance and strength as measured by increased METs and functional capacity ( )        Able to understand and use rate of perceived exertion (RPE) scale Yes       Intervention Provide education and explanation on how to use RPE scale       Expected Outcomes Short Term: Able to use RPE daily in rehab to express subjective intensity level;Long Term:  Able to use RPE to guide intensity level when exercising independently       Able to understand and use Dyspnea scale Yes       Intervention Provide education and explanation on how to use Dyspnea scale       Expected Outcomes Short Term: Able to use Dyspnea scale daily in rehab to express subjective sense of shortness of breath during exertion;Long Term: Able to use Dyspnea scale to guide intensity level when exercising independently       Knowledge and understanding of Target Heart Rate Range (THRR) Yes       Intervention Provide education and explanation of THRR including how the numbers were predicted and where they are located for reference       Expected Outcomes Short Term: Able to state/look up THRR;Long Term: Able to use THRR to govern intensity when exercising independently;Short Term: Able to use daily as guideline for intensity in rehab       Understanding of Exercise Prescription Yes       Intervention Provide education, explanation, and written materials on patient's individual exercise prescription       Expected Outcomes Short Term: Able to explain program exercise prescription;Long Term: Able to explain home exercise prescription to exercise independently          Exercise Goals Re-Evaluation :  Exercise Goals Re-Evaluation     Row Name 10/15/24 1631             Exercise Goal Re-Evaluation   Exercise Goals Review Increase Physical Activity;Able to understand and use Dyspnea scale;Understanding of Exercise Prescription;Increase Strength and Stamina;Knowledge and understanding of Target Heart Rate Range (THRR);Able to understand and use rate of perceived exertion (RPE) scale       Comments Kelten is scheduled to  begin exercise on 12/16. Will continue to monitor and progress as able.       Expected Outcomes Through exercise at rehab and home, the patient will decrease shortness of breath with daily activities and feel confident in carrying out an exercise regimen at home.          Discharge Exercise Prescription (Final Exercise Prescription Changes):  Exercise Prescription Changes - 10/27/24 0900       Response to Exercise   Blood Pressure (Admit) 132/56    Blood Pressure (Exercise) 126/62    Blood Pressure (Exit) 120/54    Heart Rate (Admit) 82 bpm    Heart Rate (Exercise) 90 bpm  Heart Rate (Exit) 76 bpm    Oxygen  Saturation (Admit) 96 %    Oxygen  Saturation (Exercise) 95 %    Oxygen  Saturation (Exit) 97 %    Rating of Perceived Exertion (Exercise) 14    Perceived Dyspnea (Exercise) 3    Duration Continue with 30 min of aerobic exercise without signs/symptoms of physical distress.    Intensity THRR unchanged      Progression   Progression Continue to progress workloads to maintain intensity without signs/symptoms of physical distress.      Resistance Training   Weight red bands    Reps 10-15    Time 10 Minutes      Oxygen    Oxygen  Continuous    Liters 4      T5 Nustep   Level 3    SPM 63    Minutes 15    METs 1.8      Track   Laps 5    Minutes 15    METs 1.77      Oxygen    Maintain Oxygen  Saturation 88% or higher          Nutrition:  Target Goals: Understanding of nutrition guidelines, daily intake of sodium 1500mg , cholesterol 200mg , calories 30% from fat and 7% or less from saturated fats, daily to have 5 or more servings of fruits and vegetables.  Biometrics:    Nutrition Therapy Plan and Nutrition Goals:  Nutrition Therapy & Goals - 10/20/24 0921       Nutrition Therapy   Diet Diabetic diet    Drug/Food Interactions Statins/Certain Fruits      Personal Nutrition Goals   Nutrition Goal Patient to improve diet quality by using the plate method as a  guide for meal planning to include lean protein/plant protein, fruits, vegetables, whole grains, nonfat dairy as part of a well-balanced diet.    Personal Goal #2 Patient to identify strategies for weight loss with goal of 0.5-2 # per week of weight loss.    Comments Patient with past medical history significant for COPD, Type II DM, Class II obesity, hypothyroidism, renal insufficiency, s/p recent left transmetatarsal amputation. Pt reports previously working with weight loss program. Continues to monitor and control portions. Recent 1.7% wt loss noted over past month. Typical diet consists of eggs X 2 and 2 slices of turkey bacon for breakfast, mac n' cheese from box or sandwich such as bologna and cheese, pimento cheese, or chicken salad on low calorie bread for lunch, and chicken/pork chops/steak with vegetables for dinner. Occasionally may have apple or piece of chocolate for snack. Fluids consist of water, diet sodas and coffee with sugar-free creamer. Pt reports fasting blood glucose 123-160's; most recent A1c 6.6%. RD discussed filling more of plate with plant-based foods for increased fiber which may help manage hunger/portion sizes as well as blood glucose. Encouraged pt to include lean protein with meals/snacks. Patient will benefit from participation in pulmonary rehab for nutrition education, exercise, and lifestyle modification.      Intervention Plan   Intervention Prescribe, educate and counsel regarding individualized specific dietary modifications aiming towards targeted core components such as weight, hypertension, lipid management, diabetes, heart failure and other comorbidities.;Nutrition handout(s) given to patient.   Handouts: Lean Protein for COPD, Diabetes Plate Method from ADA   Expected Outcomes Short Term Goal: Understand basic principles of dietary content, such as calories, fat, sodium, cholesterol and nutrients.;Long Term Goal: Adherence to prescribed nutrition plan.  Nutrition Assessments:  MEDIFICTS Score Key: >=70 Need to make dietary changes  40-70 Heart Healthy Diet <= 40 Therapeutic Level Cholesterol Diet   Picture Your Plate Scores: <59 Unhealthy dietary pattern with much room for improvement. 41-50 Dietary pattern unlikely to meet recommendations for good health and room for improvement. 51-60 More healthful dietary pattern, with some room for improvement.  >60 Healthy dietary pattern, although there may be some specific behaviors that could be improved.    Nutrition Goals Re-Evaluation:   Nutrition Goals Discharge (Final Nutrition Goals Re-Evaluation):   Psychosocial: Target Goals: Acknowledge presence or absence of significant depression and/or stress, maximize coping skills, provide positive support system. Participant is able to verbalize types and ability to use techniques and skills needed for reducing stress and depression.  Initial Review & Psychosocial Screening:  Initial Psych Review & Screening - 10/12/24 1032       Initial Review   Current issues with History of Depression;Current Psychotropic Meds      Family Dynamics   Good Support System? Yes    Comments wife [name], 2 kids      Barriers   Psychosocial barriers to participate in program The patient should benefit from training in stress management and relaxation.      Screening Interventions   Interventions Encouraged to exercise    Expected Outcomes Short Term goal: Utilizing psychosocial counselor, staff and physician to assist with identification of specific Stressors or current issues interfering with healing process. Setting desired goal for each stressor or current issue identified.;Long Term Goal: Stressors or current issues are controlled or eliminated.;Short Term goal: Identification and review with participant of any Quality of Life or Depression concerns found by scoring the questionnaire.;Long Term goal: The participant improves quality of Life and  PHQ9 Scores as seen by post scores and/or verbalization of changes          Quality of Life Scores:  Scores of 19 and below usually indicate a poorer quality of life in these areas.  A difference of  2-3 points is a clinically meaningful difference.  A difference of 2-3 points in the total score of the Quality of Life Index has been associated with significant improvement in overall quality of life, self-image, physical symptoms, and general health in studies assessing change in quality of life.  PHQ-9: Review Flowsheet  More data exists      10/16/2024 10/12/2024 09/15/2024 08/28/2024 05/15/2024  Depression screen PHQ 2/9  Decreased Interest 3 2 3 3  0  Down, Depressed, Hopeless 3 1 2 2  0  PHQ - 2 Score 6 3 5 5  0  Altered sleeping 2 0 0 0 0  Tired, decreased energy 1 0 3 3 0  Change in appetite 3 2 0 0 0  Feeling bad or failure about yourself  0 1 1 1  0  Trouble concentrating 0 0 0 0 0  Moving slowly or fidgety/restless 0 0 0 0 0  Suicidal thoughts 0 0 0 0 0  PHQ-9 Score 12 6 9 9   0   Difficult doing work/chores Very difficult Somewhat difficult Somewhat difficult Somewhat difficult Not difficult at all    Details       Data saved with a previous flowsheet row definition        Interpretation of Total Score  Total Score Depression Severity:  1-4 = Minimal depression, 5-9 = Mild depression, 10-14 = Moderate depression, 15-19 = Moderately severe depression, 20-27 = Severe depression   Psychosocial Evaluation and Intervention:  Psychosocial Evaluation - 10/12/24 1035       Psychosocial Evaluation & Interventions   Interventions Relaxation education;Encouraged to exercise with the program and follow exercise prescription    Expected Outcomes Pt to participate in PR    Continue Psychosocial Services  Follow up required by staff          Psychosocial Re-Evaluation:  Psychosocial Re-Evaluation     Row Name 10/16/24 0941             Psychosocial Re-Evaluation    Current issues with History of Depression;Current Psychotropic Meds       Comments Konrad is scheduled to start PR on 10/20/24. No new psy/soc barriers or concerns since orientation.       Expected Outcomes For Hue to have no psy/soc barriers or concerns while participating in PR       Interventions Encouraged to attend Pulmonary Rehabilitation for the exercise       Continue Psychosocial Services  Follow up required by staff          Psychosocial Discharge (Final Psychosocial Re-Evaluation):  Psychosocial Re-Evaluation - 10/16/24 0941       Psychosocial Re-Evaluation   Current issues with History of Depression;Current Psychotropic Meds    Comments Rashad is scheduled to start PR on 10/20/24. No new psy/soc barriers or concerns since orientation.    Expected Outcomes For Gaddiel to have no psy/soc barriers or concerns while participating in PR    Interventions Encouraged to attend Pulmonary Rehabilitation for the exercise    Continue Psychosocial Services  Follow up required by staff          Education: Education Goals: Education classes will be provided on a weekly basis, covering required topics. Participant will state understanding/return demonstration of topics presented.  Learning Barriers/Preferences:  Learning Barriers/Preferences - 10/12/24 1036       Learning Barriers/Preferences   Learning Barriers Sight;Hearing    Learning Preferences Skilled Demonstration          Education Topics: Know Your Numbers Group instruction that is supported by a PowerPoint presentation. Instructor discusses importance of knowing and understanding resting, exercise, and post-exercise oxygen  saturation, heart rate, and blood pressure. Oxygen  saturation, heart rate, blood pressure, rating of perceived exertion, and dyspnea are reviewed along with a normal range for these values.  Flowsheet Row PULMONARY REHAB CHRONIC OBSTRUCTIVE PULMONARY DISEASE from 10/22/2024 in [facility] for Heart, Vascular, & Lung Health  Date 10/22/24  Educator EP  Instruction Review Code 1- Verbalizes Understanding    Exercise for the Pulmonary Patient Group instruction that is supported by a PowerPoint presentation. Instructor discusses benefits of exercise, core components of exercise, frequency, duration, and intensity of an exercise routine, importance of utilizing pulse oximetry during exercise, safety while exercising, and options of places to exercise outside of rehab.    MET Level  Group instruction provided by PowerPoint, verbal discussion, and written material to support subject matter. Instructor reviews what METs are and how to increase METs.    Pulmonary Medications Verbally interactive group education provided by instructor with focus on inhaled medications and proper administration.   Anatomy and Physiology of the Respiratory System Group instruction provided by PowerPoint, verbal discussion, and written material to support subject matter. Instructor reviews respiratory cycle and anatomical components of the respiratory system and their functions. Instructor also reviews differences in obstructive and restrictive respiratory diseases with examples of each.    Oxygen  Safety Group instruction provided by PowerPoint, verbal discussion, and  written material to support subject matter. There is an overview of What is Oxygen  and Why do we need it.  Instructor also reviews how to create a safe environment for oxygen  use, the importance of using oxygen  as prescribed, and the risks of noncompliance. There is a brief discussion on traveling with oxygen  and resources the patient may utilize.   Oxygen  Use Group instruction provided by PowerPoint, verbal discussion, and written material to discuss how supplemental oxygen  is prescribed and different types of oxygen  supply systems. Resources for more information are provided.    Breathing Techniques Group  instruction that is supported by demonstration and informational handouts. Instructor discusses the benefits of pursed lip and diaphragmatic breathing and detailed demonstration on how to perform both.     Risk Factor Reduction Group instruction that is supported by a PowerPoint presentation. Instructor discusses the definition of a risk factor, different risk factors for pulmonary disease, and how the heart and lungs work together.   Pulmonary Diseases Group instruction provided by PowerPoint, verbal discussion, and written material to support subject matter. Instructor gives an overview of the different type of pulmonary diseases. There is also a discussion on risk factors and symptoms as well as ways to manage the diseases.   Stress and Energy Conservation Group instruction provided by PowerPoint, verbal discussion, and written material to support subject matter. Instructor gives an overview of stress and the impact it can have on the body. Instructor also reviews ways to reduce stress. There is also a discussion on energy conservation and ways to conserve energy throughout the day.   Warning Signs and Symptoms Group instruction provided by PowerPoint, verbal discussion, and written material to support subject matter. Instructor reviews warning signs and symptoms of stroke, heart attack, cold and flu. Instructor also reviews ways to prevent the spread of infection.   Other Education Group or individual verbal, written, or video instructions that support the educational goals of the pulmonary rehab program.    Knowledge Questionnaire Score:   Core Components/Risk Factors/Patient Goals at Admission:  Personal Goals and Risk Factors at Admission - 10/12/24 1036       Core Components/Risk Factors/Patient Goals on Admission    Weight Management Weight Loss   has gained from 215 to 250 since losing toes, did [facility] program 1 year ago   Improve shortness of breath with ADL's  Yes    Intervention Provide education, individualized exercise plan and daily activity instruction to help decrease symptoms of SOB with activities of daily living.    Expected Outcomes Short Term: Improve cardiorespiratory fitness to achieve a reduction of symptoms when performing ADLs;Long Term: Be able to perform more ADLs without symptoms or delay the onset of symptoms          Core Components/Risk Factors/Patient Goals Review:   Goals and Risk Factor Review     Row Name 10/16/24 0943             Core Components/Risk Factors/Patient Goals Review   Personal Goals Review Weight Management/Obesity;Improve shortness of breath with ADL's;Develop more efficient breathing techniques such as purse lipped breathing and diaphragmatic breathing and practicing self-pacing with activity.       Review Monthly review of patients Core Components/Risk Factors/Patient Goals are as follows: Jessup has not started PR yet. Unable to asses his goals at this time.       Expected Outcomes To improve shortness of breath with ADL's, lose weight and develop more efficient breathing techniques such as purse  lipped breathing and diaphragmatic breathing; and practicing self-pacing with activity.          Core Components/Risk Factors/Patient Goals at Discharge (Final Review):   Goals and Risk Factor Review - 10/16/24 0943       Core Components/Risk Factors/Patient Goals Review   Personal Goals Review Weight Management/Obesity;Improve shortness of breath with ADL's;Develop more efficient breathing techniques such as purse lipped breathing and diaphragmatic breathing and practicing self-pacing with activity.    Review Monthly review of patients Core Components/Risk Factors/Patient Goals are as follows: Kerem has not started PR yet. Unable to asses his goals at this time.    Expected Outcomes To improve shortness of breath with ADL's, lose weight and develop more efficient breathing techniques such as purse lipped  breathing and diaphragmatic breathing; and practicing self-pacing with activity.          ITP Comments:Pt is making expected progress toward Pulmonary Rehab goals after completing 3 session(s). Recommend continued exercise, life style modification, education, and utilization of breathing techniques to increase stamina and strength, while also decreasing shortness of breath with exertion.  Dr. Slater Staff is Medical Director for Pulmonary Rehab at [facility].     Comments:      [1]  Current Outpatient Medications:    albuterol  (PROVENTIL ) (2.5 MG/3ML) 0.083% nebulizer solution, Take 3 mLs (2.5 mg total) by nebulization every 6 (six) hours as needed for wheezing or shortness of breath., Disp: 150 mL, Rfl: 1   albuterol  (VENTOLIN  HFA) 108 (90 Base) MCG/ACT inhaler, Inhale 2 puffs into the lungs every 6 (six) hours as needed., Disp: 18 g, Rfl: 3   ALPRAZolam  (XANAX ) 0.5 MG tablet, Take 1 tablet (0.5 mg total) by mouth 2 (two) times daily as needed for anxiety., Disp: 60 tablet, Rfl: 2   arformoterol  (BROVANA ) 15 MCG/2ML NEBU, Substituted for: Brovana  Neb Solution Inhale one vial via nebulizer once daily., Disp: 2 mL, Rfl: 10   Ascorbic Acid  (VITAMIN C ) 1000 MG tablet, Take 1,000 mg by mouth daily., Disp: , Rfl:    aspirin  EC 81 MG tablet, Take 81 mg by mouth at bedtime., Disp: , Rfl:    atorvastatin  (LIPITOR ) 80 MG tablet, TAKE 1 TABLET BY MOUTH EVERY DAY, Disp: 90 tablet, Rfl: 1   BD PEN NEEDLE NANO 2ND GEN 32G X 4 MM MISC, USE AS DIRECTED WITH HUMALOG  PEN, Disp: , Rfl:    Biotin 1000 MCG tablet, Take 1,000 mg by mouth daily., Disp: , Rfl:    Blood Glucose Monitoring Suppl (ACCU-CHEK AVIVA PLUS) w/Device KIT, Check blood sugars three times daily, Disp: 1 kit, Rfl: 0   budesonide  (PULMICORT ) 0.25 MG/2ML nebulizer solution, Take 2 mLs (0.25 mg total) by nebulization daily., Disp: 120 mL, Rfl: 12   busPIRone  (BUSPAR ) 10 MG tablet, Take 1 tablet (10 mg total) by mouth 2 (two) times  daily., Disp: 60 tablet, Rfl: 2   carvedilol  (COREG ) 25 MG tablet, TAKE ONE-HALF TABLET BY MOUTH TWICE DAILY WITH MEALS, Disp: 90 tablet, Rfl: 3   cholecalciferol  (VITAMIN D ) 1000 UNITS tablet, Take 1,000 Units by mouth daily., Disp: , Rfl:    clopidogrel  (PLAVIX ) 75 MG tablet, TAKE 1 TABLET BY MOUTH EVERY DAY, Disp: 90 tablet, Rfl: 1   dorzolamide -timolol  (COSOPT ) 2-0.5 % ophthalmic solution, Place 1 drop into both eyes daily., Disp: , Rfl:    ezetimibe  (ZETIA ) 10 MG tablet, TAKE 1 TABLET BY MOUTH EVERY DAY, Disp: 90 tablet, Rfl: 3   FLUoxetine  (PROZAC ) 40 MG capsule, TAKE 1  CAPSULE (40 MG TOTAL) BY MOUTH DAILY., Disp: 90 capsule, Rfl: 1   glimepiride  (AMARYL ) 2 MG tablet, TAKE 1 TABLET BY MOUTH 2 TIMES DAILY., Disp: 180 tablet, Rfl: 3   glucose blood (ACCU-CHEK GUIDE TEST) test strip, Check blood sugars 3 times daily, Disp: 300 each, Rfl: 12   HYDROcodone -acetaminophen  (NORCO) 7.5-325 MG tablet, Take 1-2 tablets by mouth every 12 (twelve) hours as needed for severe pain (pain score 7-10) or moderate pain (pain score 4-6)., Disp: 20 tablet, Rfl: 0   insulin  lispro (HUMALOG  KWIKPEN) 100 UNIT/ML KwikPen, Before each meal 3 times a day, 140-199 - 3 units, 200-250 - 6 units, 251-299 - 8 units,  300-349 - 10 units,  350 or above 12 units. Insulin  PEN if approved, provide syringes and needles if needed., Disp: 15 mL, Rfl: 3   Lancets (ACCU-CHEK MULTICLIX) lancets, Check blood sugars three times daily, Disp: 300 each, Rfl: 12   Latanoprost  0.005 % EMUL, Place 1 drop into both eyes in the morning and at bedtime., Disp: , Rfl:    levothyroxine  (SYNTHROID ) 25 MCG tablet, TAKE 1 TABLET BY MOUTH DAILY BEFORE BREAKFAST., Disp: 90 tablet, Rfl: 1   losartan  (COZAAR ) 50 MG tablet, TAKE 1 TABLET BY MOUTH EVERY DAY, Disp: 90 tablet, Rfl: 1   Magnesium  500 MG TABS, Take 500 mg by mouth daily., Disp: , Rfl:    memantine  (NAMENDA ) 5 MG tablet, Take 1 tablet (5 mg total) by mouth 2 (two) times daily., Disp: 180 tablet,  Rfl: 3   Multiple Vitamin (MULTIVITAMIN) tablet, Take 1 tablet by mouth daily., Disp: , Rfl:    Omega-3 Fatty Acids (FISH OIL) 1000 MG CAPS, Take 1,000 mg by mouth 2 (two) times daily., Disp: , Rfl:    pioglitazone  (ACTOS ) 30 MG tablet, Take 1 tablet (30 mg total) by mouth daily., Disp: 90 tablet, Rfl: 1   prednisoLONE  acetate (PRED FORTE ) 1 % ophthalmic suspension, Place 1 drop into the right eye 2 (two) times daily., Disp: , Rfl:    pregabalin  (LYRICA ) 150 MG capsule, TAKE 1 CAPSULE BY MOUTH TWICE A DAY, Disp: 60 capsule, Rfl: 1   vitamin B-12 (CYANOCOBALAMIN) 1000 MCG tablet, Take 1,000 mcg by mouth See admin instructions. Take one tablet by mouth twice weekly on Monday and Thursday per wife, Disp: , Rfl:    zinc  gluconate 50 MG tablet, Take 50 mg by mouth daily., Disp: , Rfl:  [2]  Social History Tobacco Use  Smoking Status Former   Current packs/day: 0.00   Average packs/day: 2.0 packs/day for 40.0 years (80.0 ttl pk-yrs)   Types: Cigarettes, Cigars   Start date: 05/04/1966   Quit date: 05/04/2006   Years since quitting: 18.4  Smokeless Tobacco Never

## 2024-11-03 ENCOUNTER — Encounter (HOSPITAL_COMMUNITY)
Admission: RE | Admit: 2024-11-03 | Discharge: 2024-11-03 | Disposition: A | Discharge status: Home / Self Care | Source: Ambulatory Visit | Attending: Internal Medicine | Admitting: Internal Medicine

## 2024-11-03 ENCOUNTER — Encounter (HOSPITAL_COMMUNITY)

## 2024-11-03 VITALS — Wt 246.9 lb

## 2024-11-03 DIAGNOSIS — J449 Chronic obstructive pulmonary disease, unspecified: Secondary | ICD-10-CM

## 2024-11-03 LAB — GLUCOSE, CAPILLARY: Glucose-Capillary: 121 mg/dL — ABNORMAL HIGH (ref 70–99)

## 2024-11-03 NOTE — Progress Notes (Signed)
 Daily Session Note  Patient Details  Name: [name] MRN: [id number] Date of Birth: 11-03-1946 Referring Provider:   Conrad Ports Pulmonary Rehab Walk Test from 10/12/2024 in [facility] for Heart, Vascular, & Lung Health  Referring Provider Meade    Encounter Date: 11/03/2024  Check In:  Session Check In - 11/03/24 0816       Check-In   Supervising physician immediately available to respond to emergencies [facility] MD immediately available    Physician(s) Orren Fabry, PA    Location [facility]    Staff Present Ronal Levin, RN, Maud Moats, MS, ACSM-CEP, Exercise Physiologist;Randi Midge BS, ACSM-CEP, Exercise Physiologist    Virtual Visit No    Medication changes reported     No    Fall or balance concerns reported    No    Tobacco Cessation No Change    Warm-up and Cool-down Performed as group-led instruction   orientation only   Resistance Training Performed Yes    VAD Patient? No    PAD/SET Patient? No      Pain Assessment   Currently in Pain? No/denies    Multiple Pain Sites No          Capillary Blood Glucose: Results for orders placed or performed during the hospital encounter of 11/03/24 (from the past 24 hours)  Glucose, capillary     Status: Abnormal   Collection Time: 11/03/24  9:24 AM  Result Value Ref Range   Glucose-Capillary 121 (H) 70 - 99 mg/dL   *Note: Due to a large number of results and/or encounters for the requested time period, some results have not been displayed. A complete set of results can be found in Results Review.      Tobacco Use History[1]  Goals Met:  Independence with exercise equipment Exercise tolerated well Queuing for purse lip breathing No report of concerns or symptoms today Strength training completed today  Goals Unmet:  Not Applicable  Comments: Service time is from 0805 to 0916    Dr. Slater Staff is Medical Director for Pulmonary Rehab at [facility].      [1]  Social History Tobacco Use  Smoking Status Former   Current packs/day: 0.00   Average packs/day: 2.0 packs/day for 40.0 years (80.0 ttl pk-yrs)   Types: Cigarettes, Cigars   Start date: 05/04/1966   Quit date: 05/04/2006   Years since quitting: 18.5  Smokeless Tobacco Never

## 2024-11-10 ENCOUNTER — Encounter (HOSPITAL_COMMUNITY): Admission: RE | Admit: 2024-11-10 | Source: Ambulatory Visit

## 2024-11-10 ENCOUNTER — Encounter (HOSPITAL_COMMUNITY)

## 2024-11-11 ENCOUNTER — Other Ambulatory Visit: Payer: Self-pay | Admitting: Student

## 2024-11-11 DIAGNOSIS — F419 Anxiety disorder, unspecified: Secondary | ICD-10-CM

## 2024-11-12 ENCOUNTER — Encounter (HOSPITAL_COMMUNITY)
Admission: RE | Admit: 2024-11-12 | Discharge: 2024-11-12 | Disposition: A | Discharge status: Home / Self Care | Source: Ambulatory Visit | Attending: Internal Medicine | Admitting: Internal Medicine

## 2024-11-12 ENCOUNTER — Encounter (HOSPITAL_COMMUNITY)

## 2024-11-12 ENCOUNTER — Other Ambulatory Visit: Payer: Self-pay | Admitting: Family Medicine

## 2024-11-12 DIAGNOSIS — J449 Chronic obstructive pulmonary disease, unspecified: Secondary | ICD-10-CM | POA: Insufficient documentation

## 2024-11-12 LAB — GLUCOSE, CAPILLARY: Glucose-Capillary: 91 mg/dL (ref 70–99)

## 2024-11-12 NOTE — Progress Notes (Signed)
 Daily Session Note  Patient Details  Name: [name] MRN: [id number] Date of Birth: 10-08-1946 Referring Provider:   Conrad Ports Pulmonary Rehab Walk Test from 10/12/2024 in [facility] for Heart, Vascular, & Lung Health  Referring Provider Meade    Encounter Date: 11/12/2024  Check In:  Session Check In - 11/12/24 0810       Check-In   Supervising physician immediately available to respond to emergencies [facility] MD immediately available    Physician(s) Damien Braver, NP    Location [facility]    Staff Present Ronal Levin, RN, BSN;Randi Reeve BS, ACSM-CEP, Exercise Physiologist;Euel Castile Claudene, RT    Virtual Visit No    Medication changes reported     No    Fall or balance concerns reported    No    Tobacco Cessation No Change    Warm-up and Cool-down Performed as group-led instruction   orientation only   Resistance Training Performed Yes    VAD Patient? No    PAD/SET Patient? No      Pain Assessment   Currently in Pain? No/denies    Multiple Pain Sites No          Capillary Blood Glucose: Results for orders placed or performed during the hospital encounter of 11/12/24 (from the past 24 hours)  Glucose, capillary     Status: None   Collection Time: 11/12/24  9:31 AM  Result Value Ref Range   Glucose-Capillary 91 70 - 99 mg/dL   *Note: Due to a large number of results and/or encounters for the requested time period, some results have not been displayed. A complete set of results can be found in Results Review.      Tobacco Use History[1]  Goals Met:  Proper associated with RPD/PD & O2 Sat Independence with exercise equipment Exercise tolerated well No report of concerns or symptoms today Strength training completed today  Goals Unmet:  Not Applicable  Comments: Service time is from 0808 to 0929.    Dr. Slater Staff is Medical Director for Pulmonary Rehab at [facility].     [1]  Social History Tobacco  Use  Smoking Status Former   Current packs/day: 0.00   Average packs/day: 2.0 packs/day for 40.0 years (80.0 ttl pk-yrs)   Types: Cigarettes, Cigars   Start date: 05/04/1966   Quit date: 05/04/2006   Years since quitting: 18.5  Smokeless Tobacco Never

## 2024-11-17 ENCOUNTER — Encounter (HOSPITAL_COMMUNITY)
Admission: RE | Admit: 2024-11-17 | Discharge: 2024-11-17 | Disposition: A | Source: Ambulatory Visit | Attending: Internal Medicine

## 2024-11-17 ENCOUNTER — Encounter (HOSPITAL_COMMUNITY)

## 2024-11-17 DIAGNOSIS — J449 Chronic obstructive pulmonary disease, unspecified: Secondary | ICD-10-CM

## 2024-11-17 LAB — GLUCOSE, CAPILLARY: Glucose-Capillary: 108 mg/dL — ABNORMAL HIGH (ref 70–99)

## 2024-11-17 NOTE — Progress Notes (Signed)
 Daily Session Note  Patient Details  Name: [name] MRN: [id number] Date of Birth: 1946/08/19 Referring Provider:   Conrad Ports Pulmonary Rehab Walk Test from 10/12/2024 in [facility] for Heart, Vascular, & Lung Health  Referring Provider Meade    Encounter Date: 11/17/2024  Check In:  Session Check In - 11/17/24 9187       Check-In   Supervising physician immediately available to respond to emergencies [facility] immediately available    Physician(s) Rosaline Bane, NP    [facility]    Staff Present Ronal Levin, RN, BSN;Casey Claudene Neita Moats, MS, ACSM-CEP, Exercise Physiologist    Virtual Visit No    Medication changes reported     No    Fall or balance concerns reported    No    Tobacco Cessation No Change    Warm-up and Cool-down Performed as group-led instruction   orientation only   Resistance Training Performed Yes    VAD Patient? No    PAD/SET Patient? No      Pain Assessment   Currently in Pain? No/denies    Multiple Pain Sites No          Capillary Blood Glucose: Results for orders placed or performed during the hospital encounter of 11/17/24 (from the past 24 hours)  Glucose, capillary     Status: Abnormal   Collection Time: 11/17/24  9:30 AM  Result Value Ref Range   Glucose-Capillary 108 (H) 70 - 99 mg/dL   *Note: Due to a large number of results and/or encounters for the requested time period, some results have not been displayed. A complete set of results can be found in Results Review.      Tobacco Use History[1]  Goals Met:  Proper associated with RPD/PD & O2 Sat Exercise tolerated well No report of concerns or symptoms today Strength training completed today  Goals Unmet:  Not Applicable  Comments: Service time is from 0807 to [phone].    Dr. Slater Staff is Medical Director for Pulmonary Rehab at [facility].     [1]  Social History Tobacco Use  Smoking Status  Former   Current packs/day: 0.00   Average packs/day: 2.0 packs/day for 40.0 years (80.0 ttl pk-yrs)   Types: Cigarettes, Cigars   Start date: 05/04/1966   Quit date: 05/04/2006   Years since quitting: 18.5  Smokeless Tobacco Never

## 2024-11-19 ENCOUNTER — Encounter (HOSPITAL_COMMUNITY)

## 2024-11-19 ENCOUNTER — Encounter (HOSPITAL_COMMUNITY)
Admission: RE | Admit: 2024-11-19 | Discharge: 2024-11-19 | Disposition: A | Source: Ambulatory Visit | Attending: Internal Medicine

## 2024-11-19 VITALS — Wt 263.2 lb

## 2024-11-19 DIAGNOSIS — J449 Chronic obstructive pulmonary disease, unspecified: Secondary | ICD-10-CM

## 2024-11-19 LAB — GLUCOSE, CAPILLARY
Glucose-Capillary: 66 mg/dL — ABNORMAL LOW (ref 70–99)
Glucose-Capillary: 85 mg/dL (ref 70–99)

## 2024-11-19 NOTE — Progress Notes (Signed)
 Daily Session Note  Patient Details  Name: [name] MRN: [id number] Date of Birth: 11-06-1945 Referring Provider:   Conrad Ports Pulmonary Rehab Walk Test from 10/12/2024 in [facility] for Heart, Vascular, & Lung Health  Referring Provider Meade    Encounter Date: 11/19/2024  Check In:  Session Check In - 11/19/24 0817       Check-In   Supervising physician immediately available to respond to emergencies [facility] immediately available    Physician(s) Lum Louis, NP    [facility]    Staff Present Ronal Levin, RN, BSN;Casey Claudene Neita Moats, MS, ACSM-CEP, Exercise Physiologist    Virtual Visit No    Medication changes reported     No    Fall or balance concerns reported    No    Tobacco Cessation No Change    Warm-up and Cool-down Performed as group-led instruction    Resistance Training Performed Yes    VAD Patient? No    PAD/SET Patient? No      Pain Assessment   Currently in Pain? No/denies    Multiple Pain Sites No          Capillary Blood Glucose: Results for orders placed or performed during the hospital encounter of 11/19/24 (from the past 24 hours)  Glucose, capillary     Status: Abnormal   Collection Time: 11/19/24  9:25 AM  Result Value Ref Range   Glucose-Capillary 66 (L) 70 - 99 mg/dL   *Note: Due to a large number of results and/or encounters for the requested time period, some results have not been displayed. A complete set of results can be found in Results Review.      Tobacco Use History[1]  Goals Met:  Exercise tolerated well Queuing for purse lip breathing Strength training completed today  Goals Unmet:  Blood sugar dropped from 169 to 66. Pt symptomatic, 4oz juice and peanut butter crackers provided. BS re-check 85. Pt asymptomatic.   Comments: Service time is from 0806 to 0950    Dr. Slater Staff is Medical Director for Pulmonary Rehab at [name].       [1]  Social History Tobacco Use  Smoking Status Former   Current packs/day: 0.00   Average packs/day: 2.0 packs/day for 40.0 years (80.0 ttl pk-yrs)   Types: Cigarettes, Cigars   Start date: 05/04/1966   Quit date: 05/04/2006   Years since quitting: 18.5  Smokeless Tobacco Never
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
## 2024-11-20 ENCOUNTER — Ambulatory Visit: Admitting: Student

## 2024-11-20 ENCOUNTER — Encounter: Payer: Self-pay | Admitting: Student

## 2024-11-20 VITALS — BP 113/60 | HR 59 | Temp 97.9°F | Resp 16 | Ht 69.0 in | Wt 263.6 lb

## 2024-11-20 DIAGNOSIS — Z9981 Dependence on supplemental oxygen: Secondary | ICD-10-CM | POA: Diagnosis not present

## 2024-11-20 DIAGNOSIS — E1149 Type 2 diabetes mellitus with other diabetic neurological complication: Secondary | ICD-10-CM | POA: Diagnosis not present

## 2024-11-20 DIAGNOSIS — F32A Depression, unspecified: Secondary | ICD-10-CM | POA: Diagnosis not present

## 2024-11-20 DIAGNOSIS — F419 Anxiety disorder, unspecified: Secondary | ICD-10-CM

## 2024-11-20 DIAGNOSIS — F0390 Unspecified dementia without behavioral disturbance: Secondary | ICD-10-CM

## 2024-11-20 DIAGNOSIS — E039 Hypothyroidism, unspecified: Secondary | ICD-10-CM

## 2024-11-20 DIAGNOSIS — Z7984 Long term (current) use of oral hypoglycemic drugs: Secondary | ICD-10-CM | POA: Diagnosis not present

## 2024-11-20 DIAGNOSIS — J449 Chronic obstructive pulmonary disease, unspecified: Secondary | ICD-10-CM

## 2024-11-20 DIAGNOSIS — J432 Centrilobular emphysema: Secondary | ICD-10-CM

## 2024-11-20 MED ORDER — BUSPIRONE HCL 10 MG PO TABS: 10.0000 mg | ORAL_TABLET | Freq: Two times a day (BID) | ORAL | 1 refills | Status: AC

## 2024-11-20 NOTE — Assessment & Plan Note (Signed)
4L Nasal cannula

## 2024-11-20 NOTE — Assessment & Plan Note (Signed)
 Stable on Namenda . Following with Pulmonology.

## 2024-11-20 NOTE — Assessment & Plan Note (Addendum)
 Pt reports symptoms improved with Buspar . Reports feeling better with less frequent anxiety episodes. Stable on current medications. Continue same. - Refilled Buspar  for 90 days. - Encouraged counseling for anxiety and depression management.

## 2024-11-20 NOTE — Assessment & Plan Note (Signed)
 hgba1c acceptable, minimize simple carbs. Increase exercise as tolerated. Continue current meds

## 2024-11-20 NOTE — Assessment & Plan Note (Signed)
 On Levothyroxine, continue to monitor

## 2024-11-20 NOTE — Assessment & Plan Note (Signed)
 Denies recent exacerbations. Follows with Pulmonology.

## 2024-11-24 ENCOUNTER — Encounter (HOSPITAL_COMMUNITY): Admission: RE | Admit: 2024-11-24 | Source: Ambulatory Visit

## 2024-11-24 ENCOUNTER — Encounter (HOSPITAL_COMMUNITY)

## 2024-11-25 NOTE — Progress Notes (Signed)
 Pulmonary Individual Treatment Plan  Patient Details  Name: [name] MRN: [id number] Date of Birth: 1946/10/12 Referring Provider:   Conrad Ports Pulmonary Rehab Walk Test from 10/12/2024 in [facility]  Referring Provider Meade    Initial Encounter Date:  Flowsheet Row Pulmonary Rehab Walk Test from 10/12/2024 in [facility]  Date 10/12/24    Visit Diagnosis: Stage 3 severe COPD by GOLD classification [facility])  Patient's Home Medications on Admission:  Current Medications[1]  Past Medical History: Past Medical History:  Diagnosis Date   AAA (abdominal aortic aneurysm) without rupture 2008   Stable AAA max diameter 4.1cm but likely 3.5x3.7cm, rpt 1 yr (09/2015)   Allergic rhinitis    Anemia 08/14/2013   Aneurysm, cerebral, nonruptured 03/02/2014   Arthritis    left ankle; back LLE; right wrist  (11/10/2012)   Asthma    Back pain 03/17/2017   Carotid artery disease 05/04/2022   Cellulitis and abscess of toe of left foot 05/26/2019   Class 1 obesity with serious comorbidity and body mass index (BMI) of 32.0 to 32.9 in adult 12/16/2018   COPD (chronic obstructive pulmonary disease) 02/17/2015   Coronary atherosclerosis 10/17/2007   Decreased hearing    Diabetes mellitus type 2 with neurological manifestations 04/21/2007   Sees Podiatry  Sees [facility] for eye exam, 05/06/13   Diabetic foot ulcer 05/30/2020   Diabetic peripheral vascular disease    Dyspnea 03/06/2022   Dysrhythmia    skips beats at times   Generalized anxiety disorder 06/05/2022   GERD 11/16/2009   Glaucoma 10/17/2007   Gout 12/06/2016   Hereditary and idiopathic peripheral neuropathy 10/17/2014   History of COVID-19 12/04/2019   Hyperlipidemia associated with type 2 diabetes mellitus 06/13/2020   Hypertension    Hypothyroidism 05/20/2021   Hypoxemia 09/07/2021   Iliac  artery stenosis, left 10/19/2018   Kidney stone    passed them on my own 3 times (11/10/2012)   Knee pain, left 12/12/2009   Major depressive disorder 05/25/2018   Mild obstructive sleep apnea 03/06/2022   With hypoxia, no CPAP   Neck pain 12/27/2020   Osteoarthritis 06/05/2022   Pneumonia 2011   Prolonged QT interval 09/07/2021   Renal artery stenosis 10/19/2018   Renal insufficiency 08/14/2013   Right hip pain    Right otitis media 05/30/2020   SCCA (squamous cell carcinoma) of skin 07/28/2018   Left Hand Dorsum (well diff) (curet and 5FU)   Stroke 05/09/2005   Small chronic cortical and white matter infarcts within the right ACA/MCA and right ACA/PCA watershed territories   Synovial cyst of lumbar facet joint 04/29/2017   Tinnitus     Tobacco Use: Tobacco Use History[2]  Labs: Review Flowsheet  More data exists      Latest Ref Rng & Units 11/19/2023 01/28/2024 04/15/2024 05/15/2024 08/28/2024  Labs for ITP Cardiac and Pulmonary Rehab  Cholestrol 0 - 200 mg/dL - - - 873  878   LDL (calc) 0 - 99 mg/dL - - - 68  64   HDL-C >60.99 mg/dL - - - 65.79  67.19   Trlycerides 0.0 - 149.0 mg/dL - - - 877.9  880.9   Hemoglobin A1c 4.6 - 6.5 % 7.0  6.7  - 7.0  6.6   TCO2 22 - 32 mmol/L - - 27  - -    Capillary Blood Glucose: Lab Results  Component Value  Date   GLUCAP 85 11/19/2024   GLUCAP 66 (L) 11/19/2024   GLUCAP 108 (H) 11/17/2024   GLUCAP 91 11/12/2024   GLUCAP 121 (H) 11/03/2024     Pulmonary Assessment Scores:  Pulmonary Assessment Scores     Row Name 10/12/24 1039         ADL UCSD   ADL Phase Entry     SOB Score total 94       CAT Score   CAT Score 20       mMRC Score   mMRC Score 4       UCSD: Self-administered rating of dyspnea associated with activities of daily living (ADLs) 6-point scale (0 = not at all to 5 = maximal or unable to do because of breathlessness)  Scoring Scores range from 0 to 120.  Minimally important difference is 5  units  CAT: CAT can identify the health impairment of COPD patients and is better correlated with disease progression.  CAT has a scoring range of zero to 40. The CAT score is classified into four groups of low (less than 10), medium (10 - 20), high (21-30) and very high (31-40) based on the impact level of disease on health status. A CAT score over 10 suggests significant symptoms.  A worsening CAT score could be explained by an exacerbation, poor medication adherence, poor inhaler technique, or progression of COPD or comorbid conditions.  CAT MCID is 2 points  mMRC: mMRC (Modified Medical Research Council) Dyspnea Scale is used to assess the degree of baseline functional disability in patients of respiratory disease due to dyspnea. No minimal important difference is established. A decrease in score of 1 point or greater is considered a positive change.   Pulmonary Function Assessment:  Pulmonary Function Assessment - 10/12/24 1226       Breath   Bilateral Breath Sounds Decreased    Shortness of Breath Yes;Limiting activity          Exercise Target Goals: Exercise Program Goal: Individual exercise prescription set using results from initial 6 min walk test and THRR while considering  patients activity barriers and safety.   Exercise Prescription Goal: Initial exercise prescription builds to 30-45 minutes a day of aerobic activity, 2-3 days per week.  Home exercise guidelines will be given to patient during program as part of exercise prescription that the participant will acknowledge.  Activity Barriers & Risk Stratification:  Activity Barriers & Cardiac Risk Stratification - 10/12/24 1040       Activity Barriers & Cardiac Risk Stratification   Activity Barriers Muscular Weakness;Shortness of Breath;Deconditioning;Arthritis;Assistive Device;Balance Concerns    Cardiac Risk Stratification High          6 Minute Walk:  6 Minute Walk     Row Name 10/12/24 1217          6 Minute Walk   Phase Initial     Distance 640 feet     Walk Time 6 minutes     # of Rest Breaks 1  4:00-5:00     MPH 1.21     METS 0.85     RPE 11     Perceived Dyspnea  1     VO2 Peak 2.98     Symptoms Yes (comment)     Comments Used GoCart for stability. Rest 1 min due to hip pain. Only needed 3L but might have needed 4L if hadn't rested     Resting HR 66 bpm     Resting BP 106/50  Resting Oxygen  Saturation  94 %     Exercise Oxygen  Saturation  during 6 min walk 88 %     Max Ex. HR 101 bpm     Max Ex. BP 148/50     2 Minute Post BP 150/60       Interval HR   1 Minute HR 82     2 Minute HR 95     3 Minute HR 98     4 Minute HR 101     5 Minute HR 89     6 Minute HR 94     2 Minute Post HR 78     Interval Heart Rate? Yes       Interval Oxygen    Interval Oxygen ? Yes     Baseline Oxygen  Saturation % 94 %     1 Minute Oxygen  Saturation % 93 %     1 Minute Liters of Oxygen  2 L     2 Minute Oxygen  Saturation % 89 %     2 Minute Liters of Oxygen  2 L     3 Minute Oxygen  Saturation % 89 %     3 Minute Liters of Oxygen  3 L     4 Minute Oxygen  Saturation % 88 %     4 Minute Liters of Oxygen  3 L     5 Minute Oxygen  Saturation % 91 %  had rested for 1 min     5 Minute Liters of Oxygen  3 L     6 Minute Oxygen  Saturation % 92 %     6 Minute Liters of Oxygen  3 L     2 Minute Post Oxygen  Saturation % 95 %     2 Minute Post Liters of Oxygen  3 L        Oxygen  Initial Assessment:  Oxygen  Initial Assessment - 10/12/24 1038       Home Oxygen    Home Oxygen  Device Home Concentrator;Portable Concentrator;E-Tanks    Sleep Oxygen  Prescription Continuous    Liters per minute 4    Home Exercise Oxygen  Prescription Continuous    Liters per minute 4    Home Resting Oxygen  Prescription Continuous    Liters per minute 4    Compliance with Home Oxygen  Use Yes      Initial 6 min Walk   Oxygen  Used Continuous    Liters per minute 4      Program Oxygen  Prescription   Program  Oxygen  Prescription Continuous    Liters per minute 4      Intervention   Short Term Goals To learn and exhibit compliance with exercise, home and travel O2 prescription;To learn and understand importance of maintaining oxygen  saturations>88%;To learn and demonstrate proper use of respiratory medications;To learn and understand importance of monitoring SPO2 with pulse oximeter and demonstrate accurate use of the pulse oximeter.;To learn and demonstrate proper pursed lip breathing techniques or other breathing techniques. ;Other    Long  Term Goals Other;Demonstrates proper use of MDIs;Compliance with respiratory medication;Exhibits proper breathing techniques, such as pursed lip breathing or other method taught during program session;Maintenance of O2 saturations>88%;Verbalizes importance of monitoring SPO2 with pulse oximeter and return demonstration;Exhibits compliance with exercise, home  and travel O2 prescription          Oxygen  Re-Evaluation:  Oxygen  Re-Evaluation     Row Name 10/15/24 1631 11/16/24 0746           Program Oxygen  Prescription   Program Oxygen  Prescription Continuous Continuous  Liters per minute 4 4        Home Oxygen    Home Oxygen  Device Home Concentrator;Portable Concentrator;E-Tanks Home Concentrator;Portable Concentrator;E-Tanks      Sleep Oxygen  Prescription Continuous Continuous      Liters per minute 4 4      Home Exercise Oxygen  Prescription Continuous Continuous      Liters per minute 4 4      Home Resting Oxygen  Prescription Continuous Continuous      Liters per minute 4 4      Compliance with Home Oxygen  Use Yes Yes        Goals/Expected Outcomes   Short Term Goals To learn and exhibit compliance with exercise, home and travel O2 prescription;To learn and understand importance of maintaining oxygen  saturations>88%;To learn and demonstrate proper use of respiratory medications;To learn and understand importance of monitoring SPO2 with pulse  oximeter and demonstrate accurate use of the pulse oximeter.;To learn and demonstrate proper pursed lip breathing techniques or other breathing techniques. ;Other To learn and exhibit compliance with exercise, home and travel O2 prescription;To learn and understand importance of maintaining oxygen  saturations>88%;To learn and demonstrate proper use of respiratory medications;To learn and understand importance of monitoring SPO2 with pulse oximeter and demonstrate accurate use of the pulse oximeter.;To learn and demonstrate proper pursed lip breathing techniques or other breathing techniques. ;Other      Long  Term Goals Other;Demonstrates proper use of MDIs;Compliance with respiratory medication;Exhibits proper breathing techniques, such as pursed lip breathing or other method taught during program session;Maintenance of O2 saturations>88%;Verbalizes importance of monitoring SPO2 with pulse oximeter and return demonstration;Exhibits compliance with exercise, home  and travel O2 prescription Other;Demonstrates proper use of MDIs;Compliance with respiratory medication;Exhibits proper breathing techniques, such as pursed lip breathing or other method taught during program session;Maintenance of O2 saturations>88%;Verbalizes importance of monitoring SPO2 with pulse oximeter and return demonstration;Exhibits compliance with exercise, home  and travel O2 prescription      Goals/Expected Outcomes Compliance and understanding of oxygen  saturation monitoring and breathing techniques to decrease shortness of breath. Compliance and understanding of oxygen  saturation monitoring and breathing techniques to decrease shortness of breath.         Oxygen  Discharge (Final Oxygen  Re-Evaluation):  Oxygen  Re-Evaluation - 11/16/24 0746       Program Oxygen  Prescription   Program Oxygen  Prescription Continuous    Liters per minute 4      Home Oxygen    Home Oxygen  Device Home Concentrator;Portable Concentrator;E-Tanks     Sleep Oxygen  Prescription Continuous    Liters per minute 4    Home Exercise Oxygen  Prescription Continuous    Liters per minute 4    Home Resting Oxygen  Prescription Continuous    Liters per minute 4    Compliance with Home Oxygen  Use Yes      Goals/Expected Outcomes   Short Term Goals To learn and exhibit compliance with exercise, home and travel O2 prescription;To learn and understand importance of maintaining oxygen  saturations>88%;To learn and demonstrate proper use of respiratory medications;To learn and understand importance of monitoring SPO2 with pulse oximeter and demonstrate accurate use of the pulse oximeter.;To learn and demonstrate proper pursed lip breathing techniques or other breathing techniques. ;Other    Long  Term Goals Other;Demonstrates proper use of MDIs;Compliance with respiratory medication;Exhibits proper breathing techniques, such as pursed lip breathing or other method taught during program session;Maintenance of O2 saturations>88%;Verbalizes importance of monitoring SPO2 with pulse oximeter and return demonstration;Exhibits compliance with exercise, home  and travel O2  prescription    Goals/Expected Outcomes Compliance and understanding of oxygen  saturation monitoring and breathing techniques to decrease shortness of breath.          Initial Exercise Prescription:  Initial Exercise Prescription - 10/12/24 1200       Date of Initial Exercise RX and Referring Provider   Date 10/12/24    Referring Provider Meade    Expected Discharge Date 01/15/24      Oxygen    Oxygen  Continuous    Maintain Oxygen  Saturation 88% or higher      T5 Nustep   Level 1    SPM 60    Minutes 15    METs 1.5      Track   Laps 7    Minutes 15    METs 1.2      Prescription Details   Frequency (times per week) 2    Duration Progress to 30 minutes of continuous aerobic without signs/symptoms of physical distress      Intensity   THRR 40-80% of Max Heartrate 57-144     Ratings of Perceived Exertion 11-13    Perceived Dyspnea 0-4      Progression   Progression Continue progressive overload as per policy without signs/symptoms or physical distress.      Resistance Training   Training Prescription Yes    Weight red bands    Reps 10-15          Perform Capillary Blood Glucose checks as needed.  Exercise Prescription Changes:   Exercise Prescription Changes     Row Name 10/27/24 0900 11/03/24 0816 11/19/24 0821         Response to Exercise   Blood Pressure (Admit) 132/56 136/50 130/58     Blood Pressure (Exercise) 126/62 -- --     Blood Pressure (Exit) 120/54 116/62 128/82     Heart Rate (Admit) 82 bpm 75 bpm 76 bpm     Heart Rate (Exercise) 90 bpm 83 bpm 92 bpm     Heart Rate (Exit) 76 bpm 70 bpm 73 bpm     Oxygen  Saturation (Admit) 96 % 99 % 96 %     Oxygen  Saturation (Exercise) 95 % 94 % 90 %     Oxygen  Saturation (Exit) 97 % 98 % 97 %     Rating of Perceived Exertion (Exercise) 14 11 12      Perceived Dyspnea (Exercise) 3 1 1      Duration Continue with 30 min of aerobic exercise without signs/symptoms of physical distress. Continue with 30 min of aerobic exercise without signs/symptoms of physical distress. Continue with 30 min of aerobic exercise without signs/symptoms of physical distress.     Intensity THRR unchanged THRR unchanged THRR unchanged       Progression   Progression Continue to progress workloads to maintain intensity without signs/symptoms of physical distress. Continue to progress workloads to maintain intensity without signs/symptoms of physical distress. Continue to progress workloads to maintain intensity without signs/symptoms of physical distress.       Resistance Training   Weight red bands red bands red bands     Reps 10-15 10-15 10-15     Time 10 Minutes 10 Minutes 10 Minutes       Oxygen    Oxygen  Continuous Continuous Continuous     Liters 4 4 4        T5 Nustep   Level 3 2 2      SPM 63 64 74     Minutes  15 15 15  METs 1.8 1.7 1.9       Track   Laps 5 4 4      Minutes 15 15 15      METs 1.77 1.62 1.62       Oxygen    Maintain Oxygen  Saturation 88% or higher 88% or higher 88% or higher        Exercise Comments:   Exercise Goals and Review:   Exercise Goals     Row Name 10/12/24 1041             Exercise Goals   Increase Physical Activity Yes       Intervention Provide advice, education, support and counseling about physical activity/exercise needs.;Develop an individualized exercise prescription for aerobic and resistive training based on initial evaluation findings, risk stratification, comorbidities and participant's personal goals.       Expected Outcomes Short Term: Attend rehab on a regular basis to increase amount of physical activity.;Long Term: Add in home exercise to make exercise part of routine and to increase amount of physical activity.;Long Term: Exercising regularly at least 3-5 days a week.       Increase Strength and Stamina Yes       Intervention Provide advice, education, support and counseling about physical activity/exercise needs.;Develop an individualized exercise prescription for aerobic and resistive training based on initial evaluation findings, risk stratification, comorbidities and participant's personal goals.       Expected Outcomes Short Term: Increase workloads from initial exercise prescription for resistance, speed, and METs.;Short Term: Perform resistance training exercises routinely during rehab and add in resistance training at home;Long Term: Improve cardiorespiratory fitness, muscular endurance and strength as measured by increased METs and functional capacity ( )       Able to understand and use rate of perceived exertion (RPE) scale Yes       Intervention Provide education and explanation on how to use RPE scale       Expected Outcomes Short Term: Able to use RPE daily in rehab to express subjective intensity level;Long Term:  Able to use  RPE to guide intensity level when exercising independently       Able to understand and use Dyspnea scale Yes       Intervention Provide education and explanation on how to use Dyspnea scale       Expected Outcomes Short Term: Able to use Dyspnea scale daily in rehab to express subjective sense of shortness of breath during exertion;Long Term: Able to use Dyspnea scale to guide intensity level when exercising independently       Knowledge and understanding of Target Heart Rate Range (THRR) Yes       Intervention Provide education and explanation of THRR including how the numbers were predicted and where they are located for reference       Expected Outcomes Short Term: Able to state/look up THRR;Long Term: Able to use THRR to govern intensity when exercising independently;Short Term: Able to use daily as guideline for intensity in rehab       Understanding of Exercise Prescription Yes       Intervention Provide education, explanation, and written materials on patient's individual exercise prescription       Expected Outcomes Short Term: Able to explain program exercise prescription;Long Term: Able to explain home exercise prescription to exercise independently          Exercise Goals Re-Evaluation :  Exercise Goals Re-Evaluation     Row Name 10/15/24 1631 11/16/24 [phone]  Exercise Goal Re-Evaluation   Exercise Goals Review Increase Physical Activity;Able to understand and use Dyspnea scale;Understanding of Exercise Prescription;Increase Strength and Stamina;Knowledge and understanding of Target Heart Rate Range (THRR);Able to understand and use rate of perceived exertion (RPE) scale Increase Physical Activity;Able to understand and use Dyspnea scale;Understanding of Exercise Prescription;Increase Strength and Stamina;Knowledge and understanding of Target Heart Rate Range (THRR);Able to understand and use rate of perceived exertion (RPE) scale      Comments [name] is scheduled to begin  exercise on 12/16. Will continue to monitor and progress as able. [name] has completed 5 exercise sessions. He exercises for 15 min on the track and Nustep. He averages 1.77 METs on the track and 1.7 METs at level 2 on the Nustep. [name] performs the warmup and cooldown standing holding onto a rollator for balance. He has increased his track laps slightly and his Nustep METs have remained the same despite a level increase. Will continue to monitor and progress as able.      Expected Outcomes Through exercise at rehab and home, the patient will decrease shortness of breath with daily activities and feel confident in carrying out an exercise regimen at home. Through exercise at rehab and home, the patient will decrease shortness of breath with daily activities and feel confident in carrying out an exercise regimen at home.         Discharge Exercise Prescription (Final Exercise Prescription Changes):  Exercise Prescription Changes - 11/19/24 0821       Response to Exercise   Blood Pressure (Admit) 130/58    Blood Pressure (Exit) 128/82    Heart Rate (Admit) 76 bpm    Heart Rate (Exercise) 92 bpm    Heart Rate (Exit) 73 bpm    Oxygen  Saturation (Admit) 96 %    Oxygen  Saturation (Exercise) 90 %    Oxygen  Saturation (Exit) 97 %    Rating of Perceived Exertion (Exercise) 12    Perceived Dyspnea (Exercise) 1    Duration Continue with 30 min of aerobic exercise without signs/symptoms of physical distress.    Intensity THRR unchanged      Progression   Progression Continue to progress workloads to maintain intensity without signs/symptoms of physical distress.      Resistance Training   Weight red bands    Reps 10-15    Time 10 Minutes      Oxygen    Oxygen  Continuous    Liters 4      T5 Nustep   Level 2    SPM 74    Minutes 15    METs 1.9      Track   Laps 4    Minutes 15    METs 1.62      Oxygen    Maintain Oxygen  Saturation 88% or higher          Nutrition:  Target Goals:  Understanding of nutrition guidelines, daily intake of sodium 1500mg , cholesterol 200mg , calories 30% from fat and 7% or less from saturated fats, daily to have 5 or more servings of fruits and vegetables.  Biometrics:    Nutrition Therapy Plan and Nutrition Goals:  Nutrition Therapy & Goals - 10/20/24 0921       Nutrition Therapy   Diet Diabetic diet    Drug/Food Interactions Statins/Certain Fruits      Personal Nutrition Goals   Nutrition Goal Patient to improve diet quality by using the plate method as a guide for meal planning to include lean protein/plant protein, fruits, vegetables,  whole grains, nonfat dairy as part of a well-balanced diet.    Personal Goal #2 Patient to identify strategies for weight loss with goal of 0.5-2 # per week of weight loss.    Comments Patient with past medical history significant for COPD, Type II DM, Class II obesity, hypothyroidism, renal insufficiency, s/p recent left transmetatarsal amputation. Pt reports previously working with weight loss program. Continues to monitor and control portions. Recent 1.7% wt loss noted over past month. Typical diet consists of eggs X 2 and 2 slices of turkey bacon for breakfast, mac n' cheese from box or sandwich such as bologna and cheese, pimento cheese, or chicken salad on low calorie bread for lunch, and chicken/pork chops/steak with vegetables for dinner. Occasionally may have apple or piece of chocolate for snack. Fluids consist of water, diet sodas and coffee with sugar-free creamer. Pt reports fasting blood glucose 123-160's; most recent A1c 6.6%. RD discussed filling more of plate with plant-based foods for increased fiber which may help manage hunger/portion sizes as well as blood glucose. Encouraged pt to include lean protein with meals/snacks. Patient will benefit from participation in pulmonary rehab for nutrition education, exercise, and lifestyle modification.      Intervention Plan   Intervention Prescribe,  educate and counsel regarding individualized specific dietary modifications aiming towards targeted core components such as weight, hypertension, lipid management, diabetes, heart failure and other comorbidities.;Nutrition handout(s) given to patient.   Handouts: Lean Protein for COPD, Diabetes Plate Method from ADA   Expected Outcomes Short Term Goal: Understand basic principles of dietary content, such as calories, fat, sodium, cholesterol and nutrients.;Long Term Goal: Adherence to prescribed nutrition plan.          Nutrition Assessments:  MEDIFICTS Score Key: >=70 Need to make dietary changes  40-70 Heart Healthy Diet <= 40 Therapeutic Level Cholesterol Diet   Picture Your Plate Scores: <59 Unhealthy dietary pattern with much room for improvement. 41-50 Dietary pattern unlikely to meet recommendations for good health and room for improvement. 51-60 More healthful dietary pattern, with some room for improvement.  >60 Healthy dietary pattern, although there may be some specific behaviors that could be improved.    Nutrition Goals Re-Evaluation:  Nutrition Goals Re-Evaluation     Row Name 11/17/24 0924             Goals   Current Weight 262 lb (118.8 kg)       Nutrition Goal Patient to improve diet quality by using the plate method as a guide for meal planning to include lean protein/plant protein, fruits, vegetables, whole grains, nonfat dairy as part of a well-balanced diet.       Comment Wt gain of ~ 1.3% over past month.       Expected Outcome Goals in action. Patient with past medical history significant for COPD, Type II DM, Class II obesity, hypothyroidism, renal insufficiency, s/p recent left transmetatarsal amputation. Pt reports consuming smaller meal portions. Typical diet consists of eggs X 2 and 2 slices of turkey bacon for breakfast, couple of hot dogs for lunch, hamburger with potatoes for dinner. Snacks typically consist of fruit. Continues to endorse good  glycemic control with fasting blood glucose ~ 90 this am. Wt appears to fluctuate with non-significant wt gain noted over past month. RD encouraged pt to continue portion control. Discussed filling plate with low calorie dense foods such as vegetables. Patient will benefit from participation in pulmonary rehab for nutrition education, exercise, and lifestyle modification.  Personal Goal #2 Re-Evaluation   Personal Goal #2 Patient to identify strategies for weight loss with goal of 0.5-2 # per week of weight loss.          Nutrition Goals Discharge (Final Nutrition Goals Re-Evaluation):  Nutrition Goals Re-Evaluation - 11/17/24 0924       Goals   Current Weight 262 lb (118.8 kg)    Nutrition Goal Patient to improve diet quality by using the plate method as a guide for meal planning to include lean protein/plant protein, fruits, vegetables, whole grains, nonfat dairy as part of a well-balanced diet.    Comment Wt gain of ~ 1.3% over past month.    Expected Outcome Goals in action. Patient with past medical history significant for COPD, Type II DM, Class II obesity, hypothyroidism, renal insufficiency, s/p recent left transmetatarsal amputation. Pt reports consuming smaller meal portions. Typical diet consists of eggs X 2 and 2 slices of turkey bacon for breakfast, couple of hot dogs for lunch, hamburger with potatoes for dinner. Snacks typically consist of fruit. Continues to endorse good glycemic control with fasting blood glucose ~ 90 this am. Wt appears to fluctuate with non-significant wt gain noted over past month. RD encouraged pt to continue portion control. Discussed filling plate with low calorie dense foods such as vegetables. Patient will benefit from participation in pulmonary rehab for nutrition education, exercise, and lifestyle modification.      Personal Goal #2 Re-Evaluation   Personal Goal #2 Patient to identify strategies for weight loss with goal of 0.5-2 # per week of  weight loss.          Psychosocial: Target Goals: Acknowledge presence or absence of significant depression and/or stress, maximize coping skills, provide positive support system. Participant is able to verbalize types and ability to use techniques and skills needed for reducing stress and depression.  Initial Review & Psychosocial Screening:  Initial Psych Review & Screening - 10/12/24 1032       Initial Review   Current issues with History of Depression;Current Psychotropic Meds      Family Dynamics   Good Support System? Yes    Comments wife [name], 2 kids      Barriers   Psychosocial barriers to participate in program The patient should benefit from training in stress management and relaxation.      Screening Interventions   Interventions Encouraged to exercise    Expected Outcomes Short Term goal: Utilizing psychosocial counselor, staff and physician to assist with identification of specific Stressors or current issues interfering with healing process. Setting desired goal for each stressor or current issue identified.;Long Term Goal: Stressors or current issues are controlled or eliminated.;Short Term goal: Identification and review with participant of any Quality of Life or Depression concerns found by scoring the questionnaire.;Long Term goal: The participant improves quality of Life and PHQ9 Scores as seen by post scores and/or verbalization of changes          Quality of Life Scores:  Scores of 19 and below usually indicate a poorer quality of life in these areas.  A difference of  2-3 points is a clinically meaningful difference.  A difference of 2-3 points in the total score of the Quality of Life Index has been associated with significant improvement in overall quality of life, self-image, physical symptoms, and general health in studies assessing change in quality of life.  PHQ-9: Review Flowsheet  More data exists      11/20/2024 10/16/2024 10/12/2024 09/15/2024  08/28/2024  Depression screen PHQ 2/9  Decreased Interest 1 3 2 3 3   Down, Depressed, Hopeless 2 3 1 2 2   PHQ - 2 Score 3 6 3 5 5   Altered sleeping 3 2 0 0 0  Tired, decreased energy 3 1 0 3 3  Change in appetite 3 3 2  0 0  Feeling bad or failure about yourself  2 0 1 1 1   Trouble concentrating 0 0 0 0 0  Moving slowly or fidgety/restless 3 0 0 0 0  Suicidal thoughts 0 0 0 0 0  PHQ-9 Score 17 12 6 9 9    Difficult doing work/chores Extremely dIfficult Very difficult Somewhat difficult Somewhat difficult Somewhat difficult    Details       Data saved with a previous flowsheet row definition        Interpretation of Total Score  Total Score Depression Severity:  1-4 = Minimal depression, 5-9 = Mild depression, 10-14 = Moderate depression, 15-19 = Moderately severe depression, 20-27 = Severe depression   Psychosocial Evaluation and Intervention:  Psychosocial Evaluation - 10/12/24 1035       Psychosocial Evaluation & Interventions   Interventions Relaxation education;Encouraged to exercise with the program and follow exercise prescription    Expected Outcomes Pt to participate in PR    Continue Psychosocial Services  Follow up required by staff          Psychosocial Re-Evaluation:  Psychosocial Re-Evaluation     Row Name 10/16/24 0941 11/13/24 0920           Psychosocial Re-Evaluation   Current issues with History of Depression;Current Psychotropic Meds History of Depression;Current Psychotropic Meds      Comments [name] is scheduled to start PR on 10/20/24. No new psy/soc barriers or concerns since orientation. 30 day psy/soc re-eval as follows: [name] denies any new psy/soc barriers or concerns at this time. He has good support from his wife and kids. He feels his depression is stable at this time and he is compliant with taking his psychotropic meds. He denies any needs at this time.      Expected Outcomes For [name] to have no psy/soc barriers or concerns while  participating in PR For [name] to have no psy/soc barriers or concerns while participating in PR      Interventions Encouraged to attend Pulmonary Rehabilitation for the exercise Encouraged to attend Pulmonary Rehabilitation for the exercise      Continue Psychosocial Services  Follow up required by staff Follow up required by staff         Psychosocial Discharge (Final Psychosocial Re-Evaluation):  Psychosocial Re-Evaluation - 11/13/24 0920       Psychosocial Re-Evaluation   Current issues with History of Depression;Current Psychotropic Meds    Comments 30 day psy/soc re-eval as follows: [name] denies any new psy/soc barriers or concerns at this time. He has good support from his wife and kids. He feels his depression is stable at this time and he is compliant with taking his psychotropic meds. He denies any needs at this time.    Expected Outcomes For [name] to have no psy/soc barriers or concerns while participating in PR    Interventions Encouraged to attend Pulmonary Rehabilitation for the exercise    Continue Psychosocial Services  Follow up required by staff          Education: Education Goals: Education classes will be provided on a weekly basis, covering required topics. Participant will state understanding/return demonstration of topics presented.  Learning  Barriers/Preferences:  Learning Barriers/Preferences - 10/12/24 1036       Learning Barriers/Preferences   Learning Barriers Sight;Hearing    Learning Preferences Skilled Demonstration          Education Topics: Know Your Numbers Group instruction that is supported by a PowerPoint presentation. Instructor discusses importance of knowing and understanding resting, exercise, and post-exercise oxygen  saturation, heart rate, and blood pressure. Oxygen  saturation, heart rate, blood pressure, rating of perceived exertion, and dyspnea are reviewed along with a normal range for these values.  Flowsheet Row PULMONARY REHAB  CHRONIC OBSTRUCTIVE PULMONARY DISEASE from 10/22/2024 in [facility]  Date 10/22/24  Educator EP  Instruction Review Code 1- Verbalizes Understanding    Exercise for the Pulmonary Patient Group instruction that is supported by a PowerPoint presentation. Instructor discusses benefits of exercise, core components of exercise, frequency, duration, and intensity of an exercise routine, importance of utilizing pulse oximetry during exercise, safety while exercising, and options of places to exercise outside of rehab.    MET Level  Group instruction provided by PowerPoint, verbal discussion, and written material to support subject matter. Instructor reviews what METs are and how to increase METs.    Pulmonary Medications Verbally interactive group education provided by instructor with focus on inhaled medications and proper administration.   Anatomy and Physiology of the Respiratory System Group instruction provided by PowerPoint, verbal discussion, and written material to support subject matter. Instructor reviews respiratory cycle and anatomical components of the respiratory system and their functions. Instructor also reviews differences in obstructive and restrictive respiratory diseases with examples of each.    Oxygen  Safety Group instruction provided by PowerPoint, verbal discussion, and written material to support subject matter. There is an overview of What is Oxygen  and Why do we need it.  Instructor also reviews how to create a safe environment for oxygen  use, the importance of using oxygen  as prescribed, and the risks of noncompliance. There is a brief discussion on traveling with oxygen  and resources the patient may utilize. Flowsheet Row PULMONARY REHAB CHRONIC OBSTRUCTIVE PULMONARY DISEASE from 11/12/2024 in [facility]  Date 11/12/24  Educator RN  Instruction  Review Code 1- Verbalizes Understanding    Oxygen  Use Group instruction provided by PowerPoint, verbal discussion, and written material to discuss how supplemental oxygen  is prescribed and different types of oxygen  supply systems. Resources for more information are provided.  Flowsheet Row PULMONARY REHAB CHRONIC OBSTRUCTIVE PULMONARY DISEASE from 11/19/2024 in [facility]  Date 11/19/24  Educator RT  Instruction Review Code 1- Verbalizes Understanding    Breathing Techniques Group instruction that is supported by demonstration and informational handouts. Instructor discusses the benefits of pursed lip and diaphragmatic breathing and detailed demonstration on how to perform both.     Risk Factor Reduction Group instruction that is supported by a PowerPoint presentation. Instructor discusses the definition of a risk factor, different risk factors for pulmonary disease, and how the heart and lungs work together.   Pulmonary Diseases Group instruction provided by PowerPoint, verbal discussion, and written material to support subject matter. Instructor gives an overview of the different type of pulmonary diseases. There is also a discussion on risk factors and symptoms as well as ways to manage the diseases.   Stress and Energy Conservation Group instruction provided by PowerPoint, verbal discussion, and written material to support subject matter. Instructor  gives an overview of stress and the impact it can have on the body. Instructor also reviews ways to reduce stress. There is also a discussion on energy conservation and ways to conserve energy throughout the day.   Warning Signs and Symptoms Group instruction provided by PowerPoint, verbal discussion, and written material to support subject matter. Instructor reviews warning signs and symptoms of stroke, heart attack, cold and flu. Instructor also reviews ways to prevent the spread  of infection.   Other Education Group or individual verbal, written, or video instructions that support the educational goals of the pulmonary rehab program.    Knowledge Questionnaire Score:   Core Components/Risk Factors/Patient Goals at Admission:  Personal Goals and Risk Factors at Admission - 10/12/24 1036       Core Components/Risk Factors/Patient Goals on Admission    Weight Management Weight Loss   has gained from 215 to 250 since losing toes, did [facility] program 1 year ago   Improve shortness of breath with ADL's Yes    Intervention Provide education, individualized exercise plan and daily activity instruction to help decrease symptoms of SOB with activities of daily living.    Expected Outcomes Short Term: Improve cardiorespiratory fitness to achieve a reduction of symptoms when performing ADLs;Long Term: Be able to perform more ADLs without symptoms or delay the onset of symptoms          Core Components/Risk Factors/Patient Goals Review:   Goals and Risk Factor Review     Row Name 10/16/24 0943 11/13/24 9077           Core Components/Risk Factors/Patient Goals Review   Personal Goals Review Weight Management/Obesity;Improve shortness of breath with ADL's;Develop more efficient breathing techniques such as purse lipped breathing and diaphragmatic breathing and practicing self-pacing with activity. Weight Management/Obesity;Improve shortness of breath with ADL's;Develop more efficient breathing techniques such as purse lipped breathing and diaphragmatic breathing and practicing self-pacing with activity.      Review Monthly review of patients Core Components/Risk Factors/Patient Goals are as follows: [name] has not started PR yet. Unable to asses his goals at this time. Monthly review of patients Core Components/Risk Factors/Patient Goals are as follows: Goal progressing for weight loss. He is working with the staff dietitian on ways to achieve his weight loss  goals. Goal progressing for improving his shortness of breath with ADL's. He is currently exercising on 4L O2 to keep sats >88%. He has to stop multiple times while walking the track, but is slowly increasing his laps. Goal progressing for developing more efficient breathing techniques such as purse lipped breathing and diaphragmatic breathing; and practicing self-pacing with activity. We will continue to monitor his progress throughout the program.      Expected Outcomes To improve shortness of breath with ADL's, lose weight and develop more efficient breathing techniques such as purse lipped breathing and diaphragmatic breathing; and practicing self-pacing with activity. To improve shortness of breath with ADL's, lose weight and develop more efficient breathing techniques such as purse lipped breathing and diaphragmatic breathing; and practicing self-pacing with activity.         Core Components/Risk Factors/Patient Goals at Discharge (Final Review):   Goals and Risk Factor Review - 11/13/24 0922       Core Components/Risk Factors/Patient Goals Review   Personal Goals Review Weight Management/Obesity;Improve shortness of breath with ADL's;Develop more efficient breathing techniques such as purse lipped breathing and diaphragmatic breathing and practicing self-pacing with activity.    Review Monthly review of patients  Core Components/Risk Factors/Patient Goals are as follows: Goal progressing for weight loss. He is working with the staff dietitian on ways to achieve his weight loss goals. Goal progressing for improving his shortness of breath with ADL's. He is currently exercising on 4L O2 to keep sats >88%. He has to stop multiple times while walking the track, but is slowly increasing his laps. Goal progressing for developing more efficient breathing techniques such as purse lipped breathing and diaphragmatic breathing; and practicing self-pacing with activity. We will continue to monitor his progress  throughout the program.    Expected Outcomes To improve shortness of breath with ADL's, lose weight and develop more efficient breathing techniques such as purse lipped breathing and diaphragmatic breathing; and practicing self-pacing with activity.          ITP Comments:Pt is making expected progress toward Pulmonary Rehab goals after completing 7 session(s). Recommend continued exercise, life style modification, education, and utilization of breathing techniques to increase stamina and strength, while also decreasing shortness of breath with exertion.  Dr. Slater Staff is Medical Director for Pulmonary Rehab at [facility].          [1]  Current Outpatient Medications:    albuterol  (PROVENTIL ) (2.5 MG/3ML) 0.083% nebulizer solution, Take 3 mLs (2.5 mg total) by nebulization every 6 (six) hours as needed for wheezing or shortness of breath., Disp: 150 mL, Rfl: 1   albuterol  (VENTOLIN  HFA) 108 (90 Base) MCG/ACT inhaler, Inhale 2 puffs into the lungs every 6 (six) hours as needed., Disp: 18 g, Rfl: 3   ALPRAZolam  (XANAX ) 0.5 MG tablet, Take 1 tablet (0.5 mg total) by mouth 2 (two) times daily as needed for anxiety., Disp: 60 tablet, Rfl: 2   arformoterol  (BROVANA ) 15 MCG/2ML NEBU, Substituted for: Brovana  Neb Solution Inhale one vial via nebulizer once daily., Disp: 2 mL, Rfl: 10   Ascorbic Acid  (VITAMIN C ) 1000 MG tablet, Take 1,000 mg by mouth daily., Disp: , Rfl:    aspirin  EC 81 MG tablet, Take 81 mg by mouth at bedtime., Disp: , Rfl:    atorvastatin  (LIPITOR ) 80 MG tablet, TAKE 1 TABLET BY MOUTH EVERY DAY, Disp: 90 tablet, Rfl: 1   BD PEN NEEDLE NANO 2ND GEN 32G X 4 MM MISC, USE AS DIRECTED WITH HUMALOG  PEN, Disp: , Rfl:    Biotin 1000 MCG tablet, Take 1,000 mg by mouth daily., Disp: , Rfl:    Blood Glucose Monitoring Suppl (ACCU-CHEK AVIVA PLUS) w/Device KIT, Check blood sugars three times daily, Disp: 1 kit, Rfl: 0   budesonide  (PULMICORT ) 0.25 MG/2ML nebulizer solution,  Take 2 mLs (0.25 mg total) by nebulization daily., Disp: 120 mL, Rfl: 12   busPIRone  (BUSPAR ) 10 MG tablet, Take 1 tablet (10 mg total) by mouth 2 (two) times daily., Disp: 180 tablet, Rfl: 1   carvedilol  (COREG ) 25 MG tablet, TAKE ONE-HALF TABLET BY MOUTH TWICE DAILY WITH MEALS, Disp: 90 tablet, Rfl: 3   cholecalciferol  (VITAMIN D ) 1000 UNITS tablet, Take 1,000 Units by mouth daily., Disp: , Rfl:    clopidogrel  (PLAVIX ) 75 MG tablet, TAKE 1 TABLET BY MOUTH EVERY DAY, Disp: 90 tablet, Rfl: 1   dorzolamide -timolol  (COSOPT ) 2-0.5 % ophthalmic solution, Place 1 drop into both eyes daily., Disp: , Rfl:    ezetimibe  (ZETIA ) 10 MG tablet, TAKE 1 TABLET BY MOUTH EVERY DAY, Disp: 90 tablet, Rfl: 3   FLUoxetine  (PROZAC ) 40 MG capsule, TAKE 1 CAPSULE (40 MG TOTAL) BY MOUTH DAILY., Disp: 90 capsule, Rfl: 1  glimepiride  (AMARYL ) 2 MG tablet, TAKE 1 TABLET BY MOUTH 2 TIMES DAILY., Disp: 180 tablet, Rfl: 3   glucose blood (ACCU-CHEK GUIDE TEST) test strip, Check blood sugars 3 times daily, Disp: 300 each, Rfl: 12   HYDROcodone -acetaminophen  (NORCO) 7.5-325 MG tablet, Take 1-2 tablets by mouth every 12 (twelve) hours as needed for severe pain (pain score 7-10) or moderate pain (pain score 4-6)., Disp: 20 tablet, Rfl: 0   insulin  lispro (HUMALOG  KWIKPEN) 100 UNIT/ML KwikPen, Before each meal 3 times a day, 140-199 - 3 units, 200-250 - 6 units, 251-299 - 8 units,  300-349 - 10 units,  350 or above 12 units. Insulin  PEN if approved, provide syringes and needles if needed., Disp: 15 mL, Rfl: 3   Lancets (ACCU-CHEK MULTICLIX) lancets, Check blood sugars three times daily, Disp: 300 each, Rfl: 12   Latanoprost  0.005 % EMUL, Place 1 drop into both eyes in the morning and at bedtime., Disp: , Rfl:    levothyroxine  (SYNTHROID ) 25 MCG tablet, TAKE 1 TABLET BY MOUTH DAILY BEFORE BREAKFAST., Disp: 90 tablet, Rfl: 1   losartan  (COZAAR ) 50 MG tablet, TAKE 1 TABLET BY MOUTH EVERY DAY, Disp: 90 tablet, Rfl: 1   Magnesium  500 MG  TABS, Take 500 mg by mouth daily., Disp: , Rfl:    memantine  (NAMENDA ) 5 MG tablet, Take 1 tablet (5 mg total) by mouth 2 (two) times daily., Disp: 180 tablet, Rfl: 3   Multiple Vitamin (MULTIVITAMIN) tablet, Take 1 tablet by mouth daily., Disp: , Rfl:    Omega-3 Fatty Acids (FISH OIL) 1000 MG CAPS, Take 1,000 mg by mouth 2 (two) times daily., Disp: , Rfl:    pioglitazone  (ACTOS ) 30 MG tablet, Take 1 tablet (30 mg total) by mouth daily., Disp: 90 tablet, Rfl: 0   prednisoLONE  acetate (PRED FORTE ) 1 % ophthalmic suspension, Place 1 drop into the right eye 2 (two) times daily., Disp: , Rfl:    pregabalin  (LYRICA ) 150 MG capsule, TAKE 1 CAPSULE BY MOUTH TWICE A DAY, Disp: 60 capsule, Rfl: 1   vitamin B-12 (CYANOCOBALAMIN) 1000 MCG tablet, Take 1,000 mcg by mouth See admin instructions. Take one tablet by mouth twice weekly on Monday and Thursday per wife, Disp: , Rfl:    zinc  gluconate 50 MG tablet, Take 50 mg by mouth daily., Disp: , Rfl:  [2]  Social History Tobacco Use  Smoking Status Former   Current packs/day: 0.00   Average packs/day: 2.0 packs/day for 40.0 years (80.0 ttl pk-yrs)   Types: Cigarettes, Cigars   Start date: 05/04/1966   Quit date: 05/04/2006   Years since quitting: 18.5  Smokeless Tobacco Never

## 2024-11-26 ENCOUNTER — Ambulatory Visit: Admitting: Podiatry

## 2024-11-26 ENCOUNTER — Encounter (HOSPITAL_COMMUNITY)

## 2024-11-26 DIAGNOSIS — E11621 Type 2 diabetes mellitus with foot ulcer: Secondary | ICD-10-CM

## 2024-11-26 DIAGNOSIS — Z89432 Acquired absence of left foot: Secondary | ICD-10-CM

## 2024-11-27 ENCOUNTER — Telehealth: Payer: Self-pay

## 2024-11-27 NOTE — Telephone Encounter (Signed)
 [employer] paperwork signed and copy sent to scann

## 2024-11-29 ENCOUNTER — Other Ambulatory Visit: Payer: Self-pay | Admitting: Family Medicine

## 2024-11-30 ENCOUNTER — Telehealth (HOSPITAL_COMMUNITY): Payer: Self-pay | Admitting: *Deleted

## 2024-11-30 NOTE — Telephone Encounter (Signed)
 LVM that pt's normal 8:15 class will not operate tomorrow. If he feels comfortable driving, he can attend the 10:15 or 1:15 class.  Aliene Aris BS, ACSM-CEP 11/30/2024 2:06 PM

## 2024-12-01 ENCOUNTER — Other Ambulatory Visit: Payer: Self-pay | Admitting: *Deleted

## 2024-12-01 ENCOUNTER — Encounter (HOSPITAL_COMMUNITY)

## 2024-12-01 DIAGNOSIS — Z95828 Presence of other vascular implants and grafts: Secondary | ICD-10-CM

## 2024-12-01 DIAGNOSIS — Z9889 Other specified postprocedural states: Secondary | ICD-10-CM

## 2024-12-01 DIAGNOSIS — I771 Stricture of artery: Secondary | ICD-10-CM

## 2024-12-01 DIAGNOSIS — I7143 Infrarenal abdominal aortic aneurysm, without rupture: Secondary | ICD-10-CM

## 2024-12-02 ENCOUNTER — Telehealth (HOSPITAL_COMMUNITY): Payer: Self-pay | Admitting: *Deleted

## 2024-12-02 NOTE — Telephone Encounter (Signed)
 Called pt to communicate regarding Pulmonary Rehab elevator being worked on. He sts his driveway is still a sheet of ice and he will not be able to attend due to his risk of falling. Will cancel tomorrow's appt.  Aliene Aris BS, ACSM-CEP 12/02/2024 3:03 PM

## 2024-12-03 ENCOUNTER — Encounter (HOSPITAL_COMMUNITY)

## 2024-12-07 ENCOUNTER — Telehealth (HOSPITAL_COMMUNITY): Payer: Self-pay

## 2024-12-08 ENCOUNTER — Encounter (HOSPITAL_COMMUNITY)

## 2024-12-10 ENCOUNTER — Encounter (HOSPITAL_COMMUNITY)

## 2024-12-10 ENCOUNTER — Encounter (HOSPITAL_COMMUNITY): Admission: RE | Admit: 2024-12-10 | Discharge: 2024-12-10 | Attending: Internal Medicine

## 2024-12-10 DIAGNOSIS — J449 Chronic obstructive pulmonary disease, unspecified: Secondary | ICD-10-CM

## 2024-12-10 LAB — GLUCOSE, CAPILLARY: Glucose-Capillary: 82 mg/dL (ref 70–99)

## 2024-12-10 NOTE — Progress Notes (Signed)
 Daily Session Note  Patient Details  Name: [name] MRN: [id number] Date of Birth: [birth year] Referring Provider:   [facility] Walk Test from 10/12/2024 in [facility]  Referring Provider [place]    Encounter Date: 12/10/2024  Check In:  Session Check In - 12/10/24 9192       Check-In   Supervising physician immediately available to respond to emergencies [facility] MD immediately available    Physician(s) Orren Fabry, PA    Location [facility]    Staff Present Ronal Levin, RN, BSN;Casey Claudene, Neita Moats, MS, ACSM-CEP, Exercise Physiologist;Randi Midge BS, ACSM-CEP, Exercise Physiologist    Virtual Visit No    Medication changes reported     No    Fall or balance concerns reported    No    Tobacco Cessation No Change    Warm-up and Cool-down Performed as group-led instruction    Resistance Training Performed Yes    VAD Patient? No    PAD/SET Patient? No      Pain Assessment   Currently in Pain? No/denies          Capillary Blood Glucose: Results for orders placed or performed during the hospital encounter of 12/10/24 (from the past 24 hours)  Glucose, capillary     Status: None   Collection Time: 12/10/24  9:28 AM  Result Value Ref Range   Glucose-Capillary 82 70 - 99 mg/dL   *Note: Due to a large number of results and/or encounters for the requested time period, some results have not been displayed. A complete set of results can be found in Results Review.      Tobacco Use History[1]  Goals Met:  Proper associated with RPD/PD & O2 Sat Exercise tolerated well No report of concerns or symptoms today Strength training completed today  Goals Unmet:  Not Applicable  Comments: Service time is from 0809 to 0924.    Dr. Slater Staff is Medical Director for Pulmonary Rehab at [facility].     [1]  Social History Tobacco Use  Smoking Status Former    Current packs/day: 0.00   Average packs/day: 2.0 packs/day for 40.0 years (80.0 ttl pk-yrs)   Types: Cigarettes, Cigars   Start date: 05/04/1966   Quit date: 05/04/2006   Years since quitting: 18.6  Smokeless Tobacco Never

## 2024-12-15 ENCOUNTER — Ambulatory Visit: Admitting: Vascular Surgery

## 2024-12-15 ENCOUNTER — Encounter (HOSPITAL_COMMUNITY)

## 2024-12-15 ENCOUNTER — Other Ambulatory Visit (HOSPITAL_COMMUNITY)

## 2024-12-17 ENCOUNTER — Encounter (HOSPITAL_COMMUNITY)

## 2024-12-17 ENCOUNTER — Other Ambulatory Visit (HOSPITAL_COMMUNITY)

## 2024-12-17 ENCOUNTER — Ambulatory Visit

## 2024-12-18 ENCOUNTER — Ambulatory Visit: Admitting: Student

## 2024-12-22 ENCOUNTER — Encounter (HOSPITAL_COMMUNITY)

## 2024-12-24 ENCOUNTER — Encounter (HOSPITAL_COMMUNITY)

## 2024-12-24 ENCOUNTER — Ambulatory Visit: Admitting: Podiatry

## 2024-12-28 ENCOUNTER — Encounter

## 2024-12-29 ENCOUNTER — Ambulatory Visit (HOSPITAL_COMMUNITY)

## 2024-12-29 ENCOUNTER — Ambulatory Visit: Admitting: Vascular Surgery

## 2024-12-31 ENCOUNTER — Encounter (HOSPITAL_COMMUNITY)

## 2025-01-05 ENCOUNTER — Encounter (HOSPITAL_COMMUNITY)

## 2025-01-07 ENCOUNTER — Encounter (HOSPITAL_COMMUNITY)

## 2025-01-12 ENCOUNTER — Encounter (HOSPITAL_COMMUNITY)

## 2025-01-14 ENCOUNTER — Encounter (HOSPITAL_COMMUNITY)

## 2025-03-19 ENCOUNTER — Institutional Professional Consult (permissible substitution): Admitting: Psychology

## 2025-03-19 ENCOUNTER — Ambulatory Visit: Payer: Self-pay

## 2025-03-22 ENCOUNTER — Ambulatory Visit: Admitting: Family Medicine

## 2025-03-31 ENCOUNTER — Encounter: Payer: Self-pay | Admitting: Psychology

## 2025-04-01 ENCOUNTER — Ambulatory Visit: Payer: Self-pay | Admitting: Physician Assistant

## 2025-09-24 ENCOUNTER — Ambulatory Visit
